# Patient Record
Sex: Female | Born: 1935 | Race: White | Hispanic: No | Marital: Married | Smoking: Never smoker
Health system: Southern US, Community
[De-identification: ages and names within clinical notes are randomized; demographics above are authoritative.]

## PROBLEM LIST (undated history)

## (undated) DIAGNOSIS — B029 Zoster without complications: Secondary | ICD-10-CM

## (undated) DIAGNOSIS — C50919 Malignant neoplasm of unspecified site of unspecified female breast: Secondary | ICD-10-CM

## (undated) DIAGNOSIS — I10 Essential (primary) hypertension: Secondary | ICD-10-CM

## (undated) DIAGNOSIS — J40 Bronchitis, not specified as acute or chronic: Secondary | ICD-10-CM

## (undated) DIAGNOSIS — I4891 Unspecified atrial fibrillation: Secondary | ICD-10-CM

## (undated) DIAGNOSIS — J45909 Unspecified asthma, uncomplicated: Secondary | ICD-10-CM

## (undated) DIAGNOSIS — C801 Malignant (primary) neoplasm, unspecified: Secondary | ICD-10-CM

## (undated) DIAGNOSIS — J449 Chronic obstructive pulmonary disease, unspecified: Secondary | ICD-10-CM

## (undated) HISTORY — DX: Essential (primary) hypertension: I10

## (undated) HISTORY — DX: Zoster without complications: B02.9

## (undated) HISTORY — DX: Malignant (primary) neoplasm, unspecified: C80.1

## (undated) HISTORY — DX: Bronchitis, not specified as acute or chronic: J40

## (undated) HISTORY — DX: Chronic obstructive pulmonary disease, unspecified: J44.9

## (undated) HISTORY — PX: NASAL SINUS SURGERY: SHX719

## (undated) HISTORY — DX: Unspecified asthma, uncomplicated: J45.909

---

## 2004-03-04 ENCOUNTER — Other Ambulatory Visit: Payer: Self-pay

## 2004-03-05 ENCOUNTER — Other Ambulatory Visit: Payer: Self-pay

## 2004-08-08 ENCOUNTER — Ambulatory Visit: Payer: Self-pay | Admitting: Unknown Physician Specialty

## 2004-11-26 ENCOUNTER — Ambulatory Visit: Payer: Self-pay | Admitting: Internal Medicine

## 2005-03-21 ENCOUNTER — Emergency Department: Payer: Self-pay | Admitting: Emergency Medicine

## 2005-09-28 ENCOUNTER — Ambulatory Visit: Payer: Self-pay | Admitting: Unknown Physician Specialty

## 2005-12-24 ENCOUNTER — Ambulatory Visit: Payer: Self-pay | Admitting: Otolaryngology

## 2005-12-31 ENCOUNTER — Ambulatory Visit: Payer: Self-pay | Admitting: Otolaryngology

## 2006-06-16 ENCOUNTER — Ambulatory Visit: Payer: Self-pay | Admitting: Unknown Physician Specialty

## 2006-07-15 ENCOUNTER — Ambulatory Visit: Payer: Self-pay | Admitting: Unknown Physician Specialty

## 2006-07-23 ENCOUNTER — Emergency Department: Payer: Self-pay | Admitting: Emergency Medicine

## 2006-11-23 ENCOUNTER — Other Ambulatory Visit: Payer: Self-pay

## 2006-11-23 ENCOUNTER — Emergency Department: Payer: Self-pay | Admitting: Emergency Medicine

## 2007-01-14 ENCOUNTER — Ambulatory Visit: Payer: Self-pay | Admitting: Unknown Physician Specialty

## 2007-01-21 ENCOUNTER — Inpatient Hospital Stay: Payer: Self-pay | Admitting: Unknown Physician Specialty

## 2007-07-19 ENCOUNTER — Ambulatory Visit: Payer: Self-pay | Admitting: Internal Medicine

## 2008-08-02 ENCOUNTER — Ambulatory Visit: Payer: Self-pay | Admitting: Internal Medicine

## 2008-08-11 ENCOUNTER — Ambulatory Visit: Payer: Self-pay | Admitting: Rheumatology

## 2008-10-22 ENCOUNTER — Ambulatory Visit: Payer: Self-pay | Admitting: Pain Medicine

## 2008-10-29 ENCOUNTER — Ambulatory Visit: Payer: Self-pay | Admitting: Pain Medicine

## 2008-11-20 ENCOUNTER — Ambulatory Visit: Payer: Self-pay | Admitting: Pain Medicine

## 2008-11-21 ENCOUNTER — Ambulatory Visit: Payer: Self-pay | Admitting: Pain Medicine

## 2008-12-17 ENCOUNTER — Ambulatory Visit: Payer: Self-pay | Admitting: Unknown Physician Specialty

## 2008-12-20 ENCOUNTER — Inpatient Hospital Stay: Payer: Self-pay | Admitting: Unknown Physician Specialty

## 2008-12-26 ENCOUNTER — Encounter: Payer: Self-pay | Admitting: Internal Medicine

## 2009-08-17 DIAGNOSIS — C801 Malignant (primary) neoplasm, unspecified: Secondary | ICD-10-CM

## 2009-08-17 DIAGNOSIS — C50919 Malignant neoplasm of unspecified site of unspecified female breast: Secondary | ICD-10-CM

## 2009-08-17 DIAGNOSIS — Z923 Personal history of irradiation: Secondary | ICD-10-CM

## 2009-08-17 HISTORY — DX: Personal history of irradiation: Z92.3

## 2009-08-17 HISTORY — DX: Malignant neoplasm of unspecified site of unspecified female breast: C50.919

## 2009-08-17 HISTORY — DX: Malignant (primary) neoplasm, unspecified: C80.1

## 2009-09-25 ENCOUNTER — Ambulatory Visit: Payer: Self-pay | Admitting: Internal Medicine

## 2009-09-27 ENCOUNTER — Ambulatory Visit: Payer: Self-pay | Admitting: Internal Medicine

## 2009-10-30 ENCOUNTER — Ambulatory Visit: Payer: Self-pay | Admitting: Surgery

## 2009-10-30 HISTORY — PX: BREAST EXCISIONAL BIOPSY: SUR124

## 2009-11-15 ENCOUNTER — Ambulatory Visit: Payer: Self-pay | Admitting: Surgery

## 2009-11-15 ENCOUNTER — Ambulatory Visit: Payer: Self-pay | Admitting: Internal Medicine

## 2009-11-26 ENCOUNTER — Ambulatory Visit: Payer: Self-pay | Admitting: Internal Medicine

## 2009-12-15 ENCOUNTER — Ambulatory Visit: Payer: Self-pay | Admitting: Internal Medicine

## 2010-01-15 ENCOUNTER — Ambulatory Visit: Payer: Self-pay | Admitting: Internal Medicine

## 2010-02-03 ENCOUNTER — Ambulatory Visit: Payer: Self-pay | Admitting: Unknown Physician Specialty

## 2010-02-14 ENCOUNTER — Ambulatory Visit: Payer: Self-pay | Admitting: Internal Medicine

## 2010-03-17 ENCOUNTER — Ambulatory Visit: Payer: Self-pay | Admitting: Internal Medicine

## 2010-04-17 ENCOUNTER — Ambulatory Visit: Payer: Self-pay | Admitting: Internal Medicine

## 2010-07-14 ENCOUNTER — Ambulatory Visit: Payer: Self-pay | Admitting: Internal Medicine

## 2010-07-17 ENCOUNTER — Ambulatory Visit: Payer: Self-pay | Admitting: Internal Medicine

## 2010-10-13 ENCOUNTER — Ambulatory Visit: Payer: Self-pay | Admitting: Internal Medicine

## 2010-10-16 ENCOUNTER — Ambulatory Visit: Payer: Self-pay | Admitting: Internal Medicine

## 2010-10-31 ENCOUNTER — Emergency Department: Payer: Self-pay | Admitting: Emergency Medicine

## 2010-11-06 ENCOUNTER — Ambulatory Visit: Payer: Self-pay | Admitting: Surgery

## 2011-01-15 ENCOUNTER — Ambulatory Visit: Payer: Self-pay | Admitting: Internal Medicine

## 2011-01-16 ENCOUNTER — Ambulatory Visit: Payer: Self-pay | Admitting: Internal Medicine

## 2011-02-15 ENCOUNTER — Ambulatory Visit: Payer: Self-pay | Admitting: Internal Medicine

## 2011-03-18 ENCOUNTER — Ambulatory Visit: Payer: Self-pay | Admitting: Internal Medicine

## 2011-03-24 ENCOUNTER — Ambulatory Visit: Payer: Self-pay | Admitting: Otolaryngology

## 2011-04-24 ENCOUNTER — Ambulatory Visit: Payer: Self-pay | Admitting: Internal Medicine

## 2011-05-13 ENCOUNTER — Ambulatory Visit: Payer: Self-pay | Admitting: Surgery

## 2011-05-18 ENCOUNTER — Ambulatory Visit: Payer: Self-pay | Admitting: Internal Medicine

## 2011-05-19 ENCOUNTER — Ambulatory Visit: Payer: Self-pay | Admitting: Surgery

## 2011-05-20 ENCOUNTER — Ambulatory Visit: Payer: Self-pay | Admitting: Internal Medicine

## 2011-06-18 ENCOUNTER — Ambulatory Visit: Payer: Self-pay | Admitting: Internal Medicine

## 2011-07-24 ENCOUNTER — Ambulatory Visit: Payer: Self-pay | Admitting: Internal Medicine

## 2011-08-17 ENCOUNTER — Ambulatory Visit: Payer: Self-pay | Admitting: Surgery

## 2011-08-18 ENCOUNTER — Ambulatory Visit: Payer: Self-pay | Admitting: Internal Medicine

## 2011-10-23 ENCOUNTER — Ambulatory Visit: Payer: Self-pay | Admitting: Internal Medicine

## 2011-10-23 LAB — CANCER CENTER HEMOGLOBIN: HGB: 12.1 g/dL (ref 12.0–16.0)

## 2011-10-23 LAB — FERRITIN: Ferritin (ARMC): 17 ng/mL (ref 8–388)

## 2011-11-11 ENCOUNTER — Ambulatory Visit: Payer: Self-pay | Admitting: Surgery

## 2011-11-16 ENCOUNTER — Ambulatory Visit: Payer: Self-pay | Admitting: Internal Medicine

## 2012-01-25 ENCOUNTER — Ambulatory Visit: Payer: Self-pay | Admitting: Internal Medicine

## 2012-01-25 LAB — FERRITIN: Ferritin (ARMC): 18 ng/mL (ref 8–388)

## 2012-01-25 LAB — IRON AND TIBC
Iron Bind.Cap.(Total): 381 ug/dL (ref 250–450)
Unbound Iron-Bind.Cap.: 281 ug/dL

## 2012-01-25 LAB — CANCER CENTER HEMOGLOBIN: HGB: 12 g/dL (ref 12.0–16.0)

## 2012-02-15 ENCOUNTER — Ambulatory Visit: Payer: Self-pay | Admitting: Internal Medicine

## 2012-04-25 ENCOUNTER — Ambulatory Visit: Payer: Self-pay | Admitting: Internal Medicine

## 2012-05-17 ENCOUNTER — Ambulatory Visit: Payer: Self-pay | Admitting: Internal Medicine

## 2012-05-18 LAB — OCCULT BLOOD X 1 CARD TO LAB, STOOL
Occult Blood, Feces: NEGATIVE
Occult Blood, Feces: NEGATIVE
Occult Blood, Feces: NEGATIVE

## 2012-06-17 ENCOUNTER — Ambulatory Visit: Payer: Self-pay | Admitting: Internal Medicine

## 2012-07-17 ENCOUNTER — Ambulatory Visit: Payer: Self-pay | Admitting: Internal Medicine

## 2012-07-17 ENCOUNTER — Ambulatory Visit: Payer: Self-pay | Admitting: Cardiothoracic Surgery

## 2012-07-25 LAB — CANCER CENTER HEMOGLOBIN: HGB: 13.4 g/dL (ref 12.0–16.0)

## 2012-08-16 LAB — OCCULT BLOOD X 1 CARD TO LAB, STOOL
Occult Blood, Feces: NEGATIVE
Occult Blood, Feces: NEGATIVE
Occult Blood, Feces: NEGATIVE

## 2012-08-17 ENCOUNTER — Ambulatory Visit: Payer: Self-pay | Admitting: Cardiothoracic Surgery

## 2012-08-17 ENCOUNTER — Ambulatory Visit: Payer: Self-pay | Admitting: Internal Medicine

## 2012-09-17 ENCOUNTER — Ambulatory Visit: Payer: Self-pay | Admitting: Internal Medicine

## 2012-11-15 ENCOUNTER — Ambulatory Visit: Payer: Self-pay | Admitting: Internal Medicine

## 2012-11-21 ENCOUNTER — Ambulatory Visit: Payer: Self-pay | Admitting: Internal Medicine

## 2012-11-21 ENCOUNTER — Ambulatory Visit: Payer: Self-pay | Admitting: Surgery

## 2012-12-15 ENCOUNTER — Ambulatory Visit: Payer: Self-pay | Admitting: Internal Medicine

## 2012-12-21 ENCOUNTER — Ambulatory Visit: Payer: Self-pay | Admitting: Specialist

## 2013-01-02 ENCOUNTER — Ambulatory Visit: Payer: Self-pay | Admitting: Internal Medicine

## 2013-01-15 ENCOUNTER — Ambulatory Visit: Payer: Self-pay | Admitting: Internal Medicine

## 2013-03-16 ENCOUNTER — Ambulatory Visit: Payer: Self-pay | Admitting: Ophthalmology

## 2013-03-16 LAB — CREATININE, SERUM
Creatinine: 1.17 mg/dL (ref 0.60–1.30)
EGFR (African American): 52 — ABNORMAL LOW
EGFR (Non-African Amer.): 45 — ABNORMAL LOW

## 2013-11-22 ENCOUNTER — Ambulatory Visit: Payer: Self-pay | Admitting: Surgery

## 2013-11-29 ENCOUNTER — Ambulatory Visit: Payer: Self-pay | Admitting: Surgery

## 2013-11-29 HISTORY — PX: BREAST EXCISIONAL BIOPSY: SUR124

## 2013-11-30 LAB — PATHOLOGY REPORT

## 2013-12-11 ENCOUNTER — Ambulatory Visit: Payer: Self-pay | Admitting: Unknown Physician Specialty

## 2013-12-12 LAB — PATHOLOGY REPORT

## 2014-01-05 ENCOUNTER — Ambulatory Visit: Payer: Self-pay | Admitting: Internal Medicine

## 2014-01-15 ENCOUNTER — Ambulatory Visit: Payer: Self-pay | Admitting: Internal Medicine

## 2014-03-08 ENCOUNTER — Ambulatory Visit: Payer: Self-pay | Admitting: Internal Medicine

## 2014-03-17 ENCOUNTER — Ambulatory Visit: Payer: Self-pay | Admitting: Internal Medicine

## 2014-04-10 ENCOUNTER — Ambulatory Visit: Payer: Self-pay | Admitting: Ophthalmology

## 2014-04-10 LAB — HEMOGLOBIN: HGB: 11.6 g/dL — AB (ref 12.0–16.0)

## 2014-04-12 ENCOUNTER — Ambulatory Visit: Payer: Self-pay | Admitting: Pain Medicine

## 2014-04-16 ENCOUNTER — Ambulatory Visit: Payer: Self-pay | Admitting: Ophthalmology

## 2014-04-30 ENCOUNTER — Ambulatory Visit: Payer: Self-pay | Admitting: Pain Medicine

## 2014-05-07 ENCOUNTER — Ambulatory Visit: Payer: Self-pay | Admitting: Ophthalmology

## 2014-05-07 LAB — HEMOGLOBIN: HGB: 12 g/dL (ref 12.0–16.0)

## 2014-05-14 ENCOUNTER — Ambulatory Visit: Payer: Self-pay | Admitting: Ophthalmology

## 2014-05-20 ENCOUNTER — Ambulatory Visit: Payer: Self-pay | Admitting: Ophthalmology

## 2014-05-22 ENCOUNTER — Ambulatory Visit: Payer: Self-pay | Admitting: Pain Medicine

## 2014-05-24 ENCOUNTER — Ambulatory Visit: Payer: Self-pay | Admitting: Surgery

## 2014-05-24 LAB — EYE CULTURE

## 2014-05-28 ENCOUNTER — Ambulatory Visit: Payer: Self-pay | Admitting: Pain Medicine

## 2014-06-19 ENCOUNTER — Ambulatory Visit: Payer: Self-pay | Admitting: Pain Medicine

## 2014-07-02 ENCOUNTER — Ambulatory Visit: Payer: Self-pay | Admitting: Pain Medicine

## 2014-07-17 ENCOUNTER — Ambulatory Visit: Payer: Self-pay | Admitting: Pain Medicine

## 2014-07-25 ENCOUNTER — Ambulatory Visit: Payer: Self-pay | Admitting: Pain Medicine

## 2014-08-14 ENCOUNTER — Ambulatory Visit: Payer: Self-pay | Admitting: Pain Medicine

## 2014-09-13 ENCOUNTER — Ambulatory Visit: Payer: Self-pay | Admitting: Pain Medicine

## 2014-10-05 ENCOUNTER — Ambulatory Visit: Payer: Self-pay | Admitting: Internal Medicine

## 2014-10-16 ENCOUNTER — Ambulatory Visit
Admit: 2014-10-16 | Disposition: A | Discharge status: Home / Self Care | Payer: Self-pay | Attending: Internal Medicine | Admitting: Internal Medicine

## 2014-10-16 ENCOUNTER — Ambulatory Visit: Payer: Self-pay | Admitting: Pain Medicine

## 2014-10-29 ENCOUNTER — Ambulatory Visit: Payer: Self-pay | Admitting: Pain Medicine

## 2014-11-15 ENCOUNTER — Ambulatory Visit
Admit: 2014-11-15 | Disposition: A | Discharge status: Home / Self Care | Payer: Self-pay | Attending: Pain Medicine | Admitting: Pain Medicine

## 2014-11-26 ENCOUNTER — Emergency Department
Admit: 2014-11-26 | Disposition: A | Discharge status: Home / Self Care | Payer: Self-pay | Admitting: Emergency Medicine

## 2014-11-26 LAB — CBC WITH DIFFERENTIAL/PLATELET
BASOS ABS: 0.1 10*3/uL (ref 0.0–0.1)
BASOS PCT: 1.5 %
Eosinophil #: 0.6 10*3/uL (ref 0.0–0.7)
Eosinophil %: 11 %
HCT: 35.9 % (ref 35.0–47.0)
HGB: 11.6 g/dL — AB (ref 12.0–16.0)
LYMPHS PCT: 25.9 %
Lymphocyte #: 1.5 10*3/uL (ref 1.0–3.6)
MCH: 27.2 pg (ref 26.0–34.0)
MCHC: 32.4 g/dL (ref 32.0–36.0)
MCV: 84 fL (ref 80–100)
MONOS PCT: 12.1 %
Monocyte #: 0.7 x10 3/mm (ref 0.2–0.9)
Neutrophil #: 2.8 10*3/uL (ref 1.4–6.5)
Neutrophil %: 49.5 %
Platelet: 186 10*3/uL (ref 150–440)
RBC: 4.27 10*6/uL (ref 3.80–5.20)
RDW: 14.6 % — ABNORMAL HIGH (ref 11.5–14.5)
WBC: 5.7 10*3/uL (ref 3.6–11.0)

## 2014-11-26 LAB — BASIC METABOLIC PANEL
ANION GAP: 10 (ref 7–16)
BUN: 15 mg/dL
Calcium, Total: 9.8 mg/dL
Chloride: 102 mmol/L
Co2: 28 mmol/L
Creatinine: 1.07 mg/dL — ABNORMAL HIGH
EGFR (African American): 57 — ABNORMAL LOW
EGFR (Non-African Amer.): 49 — ABNORMAL LOW
GLUCOSE: 118 mg/dL — AB
Potassium: 3.5 mmol/L
SODIUM: 140 mmol/L

## 2014-11-30 ENCOUNTER — Other Ambulatory Visit: Payer: Self-pay | Admitting: Internal Medicine

## 2014-11-30 DIAGNOSIS — C50919 Malignant neoplasm of unspecified site of unspecified female breast: Secondary | ICD-10-CM

## 2014-11-30 DIAGNOSIS — Z79891 Long term (current) use of opiate analgesic: Secondary | ICD-10-CM

## 2014-12-03 ENCOUNTER — Ambulatory Visit
Admit: 2014-12-03 | Disposition: A | Discharge status: Home / Self Care | Payer: Self-pay | Attending: Internal Medicine | Admitting: Internal Medicine

## 2014-12-08 NOTE — Op Note (Signed)
PATIENT NAME:  Janice Perkins, STILLE MR#:  [id number] DATE OF BIRTH:  1932/11/06  DATE OF PROCEDURE:  05/14/2014  PREOPERATIVE DIAGNOSIS: Cataract, left eye.   POSTOPERATIVE DIAGNOSIS: Cataract, left eye.   PROCEDURE PERFORMED: Extracapsular cataract extraction using phacoemulsification with placement of Alcon SN6CWS, 21.5 diopter posterior chamber lens, serial number [id number].   ANESTHESIA: 4% lidocaine and 0.75% Marcaine, a 50-50 mixture with 10 units/mL of Hylenex added given as a peribulbar.   ANESTHESIOLOGIST: Dr. Marcello Moores.   COMPLICATIONS: None.   ESTIMATED BLOOD LOSS: Less than 1 mL.   DESCRIPTION OF PROCEDURE:  The patient was brought to the operating room and given a peribulbar block.  The patient was then prepped and draped in the usual fashion.  The vertical rectus muscles were imbricated using 5-0 silk sutures.  These sutures were then clamped to the sterile drapes as bridle sutures.  A limbal peritomy was performed extending two clock hours and hemostasis was obtained with cautery.  A partial thickness scleral groove was made at the surgical limbus and dissected anteriorly in a lamellar dissection using an Alcon crescent knife.  The anterior chamber was entered supero-temporally with a Superblade and through the lamellar dissection with a 2.6 mm keratome.  DisCoVisc was used to replace the aqueous and a continuous tear capsulorrhexis was carried out.  Hydrodissection and hydrodelineation were carried out with balanced salt and a 27 gauge canula.  The nucleus was rotated to confirm the effectiveness of the hydrodissection.  Phacoemulsification was carried out using a divide-and-conquer technique.  Total ultrasound time was 1 minute and 4.6 seconds with an average power of 24.8 percent. CDE of 26.93.    Irrigation/aspiration was used to remove the residual cortex.  DisCoVisc was used to inflate the capsule and the internal incision was enlarged to 3 mm with the crescent knife.  The  intraocular lens was folded and inserted into the capsular bag using an AcrySert delivery system.  Irrigation/aspiration was used to remove the residual DisCoVisc.  Miostat was injected into the anterior chamber through the paracentesis track to inflate the anterior chamber and induce miosis.  0.1 mL of Vigamox containing 0.1 mg of drug was injected via the paracentesis track.  The wound was checked for leaks and none were found. The conjunctiva was closed with cautery and the bridle sutures were removed.  Two drops of 0.3% Vigamox were placed on the eye.   An eye shield was placed on the eye.  The patient was discharged to the recovery room in good condition.   ____________________________ Loura Back Velna Hedgecock, MD sad:bu D: 05/14/2014 13:09:01 ET T: 05/14/2014 13:45:36 ET JOB#: [id number]  cc: Remo Lipps A. Gwynneth Fabio, MD, <Dictator> Martie Lee MD ELECTRONICALLY SIGNED 05/21/2014 12:11

## 2014-12-08 NOTE — Op Note (Signed)
PATIENT NAME:  Janice Perkins, Janice Perkins MR#:  [id number] DATE OF BIRTH:  Aug 21, 1932  DATE OF PROCEDURE:  04/16/2014  PREOPERATIVE DIAGNOSIS: Nuclear sclerotic cataract, right eye.   POSTOPERATIVE DIAGNOSIS: Nuclear sclerotic cataract, left eye.   PROCEDURE PERFORMED: Extracapsular cataract extraction using phacoemulsification with placement of Alcon SN6CWS, 22.0 diopter posterior chamber lens, serial number [id number].   SURGEON: Loura Back. Tramaine Snell, MD   ANESTHESIA: Lidocaine 4% and 0.75% Marcaine, a 50-50 mixture with 10 units/mL of Hylenex added given as a peribulbar.   ANESTHESIOLOGIST: Velora Heckler. Polin, MD  COMPLICATIONS: None.   ESTIMATED BLOOD LOSS: Less than 1 mL.   DESCRIPTION OF PROCEDURE: The patient was brought to the operating room and given a peribulbar block.  The patient was then prepped and draped in the usual fashion.  The vertical rectus muscles were imbricated using 5-0 silk sutures.  These sutures were then clamped to the sterile drapes as bridle sutures.  A limbal peritomy was performed extending two clock hours and hemostasis was obtained with cautery.  A partial thickness scleral groove was made at the surgical limbus and dissected anteriorly in a lamellar dissection using an Alcon crescent knife.  The anterior chamber was entered superonasally with a Superblade and through the lamellar dissection with a 2.6 mm keratome.  DisCoVisc was used to replace the aqueous and a continuous tear capsulorrhexis was carried out.  Hydrodissection and hydrodelineation were carried out with balanced salt and a 27 gauge canula.  The nucleus was rotated to confirm the effectiveness of the hydrodissection.  Phacoemulsification was carried out using a divide-and-conquer technique.  Total ultrasound time was 1 minutes and 1 second with an average power of 22 percent, CDE of 23.80.  Irrigation/aspiration was used to remove the residual cortex.  DisCoVisc was used to inflate the capsule and the  internal incision was enlarged to 3 mm with the crescent knife.  The intraocular lens was folded and inserted into the capsular bag using the AcrySert delivery system.  Irrigation/aspiration was used to remove the residual DisCoVisc.  Miostat was injected into the anterior chamber through the paracentesis track to inflate the anterior chamber and induce miosis.  A tenth of a millimeter of Vigamox containing 0.1 mg of drug was injected via the paracentesis tract. The wound was checked for leaks and none were found. The conjunctiva was closed with cautery and the bridle sutures were removed.  Two drops of 0.3% Vigamox were placed on the eye.   An eye shield was placed on the eye.  The patient was discharged to the recovery room in good condition.   ____________________________ Loura Back Kenitha Glendinning, MD sad:TT D: 04/16/2014 13:25:13 ET T: 04/16/2014 22:40:49 ET JOB#: [id number]  cc: Remo Lipps A. Hira Trent, MD, <Dictator> Martie Lee MD ELECTRONICALLY SIGNED 04/24/2014 11:25

## 2014-12-13 ENCOUNTER — Ambulatory Visit
Admit: 2014-12-13 | Disposition: A | Discharge status: Home / Self Care | Payer: Self-pay | Attending: Pain Medicine | Admitting: Pain Medicine

## 2014-12-28 ENCOUNTER — Telehealth: Payer: Self-pay | Admitting: *Deleted

## 2014-12-28 MED ORDER — LETROZOLE 2.5 MG PO TABS: 2.5000 mg | ORAL_TABLET | Freq: Every day | ORAL | Status: DC

## 2014-12-28 NOTE — Telephone Encounter (Signed)
Rx sent to [facility]. 

## 2015-01-14 ENCOUNTER — Other Ambulatory Visit: Payer: Self-pay | Admitting: Pain Medicine

## 2015-01-14 DIAGNOSIS — M5481 Occipital neuralgia: Secondary | ICD-10-CM

## 2015-01-14 DIAGNOSIS — M51369 Other intervertebral disc degeneration, lumbar region without mention of lumbar back pain or lower extremity pain: Secondary | ICD-10-CM | POA: Insufficient documentation

## 2015-01-14 DIAGNOSIS — M533 Sacrococcygeal disorders, not elsewhere classified: Secondary | ICD-10-CM

## 2015-01-14 DIAGNOSIS — M7060 Trochanteric bursitis, unspecified hip: Secondary | ICD-10-CM | POA: Insufficient documentation

## 2015-01-14 DIAGNOSIS — M47817 Spondylosis without myelopathy or radiculopathy, lumbosacral region: Secondary | ICD-10-CM | POA: Insufficient documentation

## 2015-01-14 DIAGNOSIS — M5136 Other intervertebral disc degeneration, lumbar region: Secondary | ICD-10-CM | POA: Insufficient documentation

## 2015-01-15 ENCOUNTER — Encounter: Payer: Self-pay | Admitting: Pain Medicine

## 2015-01-15 ENCOUNTER — Ambulatory Visit: Payer: Medicare Other | Admitting: Pain Medicine

## 2015-01-15 ENCOUNTER — Ambulatory Visit
Admission: RE | Admit: 2015-01-15 | Discharge: 2015-01-15 | Disposition: A | Discharge status: Home / Self Care | Payer: Medicare Other | Source: Ambulatory Visit | Attending: Internal Medicine | Admitting: Internal Medicine

## 2015-01-15 VITALS — BP 147/65 | HR 74 | Temp 98.3°F | Resp 15 | Ht 61.0 in | Wt 180.0 lb

## 2015-01-15 DIAGNOSIS — M5481 Occipital neuralgia: Secondary | ICD-10-CM

## 2015-01-15 DIAGNOSIS — M5136 Other intervertebral disc degeneration, lumbar region: Secondary | ICD-10-CM | POA: Diagnosis not present

## 2015-01-15 DIAGNOSIS — Z79891 Long term (current) use of opiate analgesic: Secondary | ICD-10-CM | POA: Insufficient documentation

## 2015-01-15 DIAGNOSIS — M5126 Other intervertebral disc displacement, lumbar region: Secondary | ICD-10-CM | POA: Diagnosis not present

## 2015-01-15 DIAGNOSIS — M79606 Pain in leg, unspecified: Secondary | ICD-10-CM | POA: Diagnosis not present

## 2015-01-15 DIAGNOSIS — N959 Unspecified menopausal and perimenopausal disorder: Secondary | ICD-10-CM | POA: Diagnosis not present

## 2015-01-15 DIAGNOSIS — M8580 Other specified disorders of bone density and structure, unspecified site: Secondary | ICD-10-CM | POA: Diagnosis not present

## 2015-01-15 DIAGNOSIS — Z853 Personal history of malignant neoplasm of breast: Secondary | ICD-10-CM | POA: Diagnosis not present

## 2015-01-15 DIAGNOSIS — M461 Sacroiliitis, not elsewhere classified: Secondary | ICD-10-CM | POA: Insufficient documentation

## 2015-01-15 DIAGNOSIS — M533 Sacrococcygeal disorders, not elsewhere classified: Secondary | ICD-10-CM

## 2015-01-15 DIAGNOSIS — M47817 Spondylosis without myelopathy or radiculopathy, lumbosacral region: Secondary | ICD-10-CM

## 2015-01-15 DIAGNOSIS — M7060 Trochanteric bursitis, unspecified hip: Secondary | ICD-10-CM

## 2015-01-15 MED ORDER — HYDROCODONE-ACETAMINOPHEN 5-325 MG PO TABS: ORAL_TABLET | ORAL | Status: DC

## 2015-01-15 NOTE — Progress Notes (Signed)
   Subjective:    Patient ID: Janice Perkins, female    DOB: [birth year], 81 y.o.   MRN: [id number]  HPI   patient is a 81 year old female returns to pain management for further evaluation and treatment of pain involving the lower back and lower extremity regions predominantly. Patient without complaint of headache or significant lower back lower extremity pain at this time. Patient denies any trauma change in events of daily living the call significant change in symptomatology. Continue present medications as prescribed will remain available to consider modification of treatment should there be significant change in patient's symptoms.  The patient is understanding and  agrees with suggested treatment plan area     Review of Systems     Objective:   Physical Exam   tenderness of the splenius capitis and occipitalis regions of mild degree. Tennis of the acromioclavicular glenohumeral joint region. Bilaterally equal grip strength. Tinel and Phalen's maneuver without increased pain of significant degree. Unremarkable Spurling's maneuver. Outpatient of the thoracic facet thoracic paraspinal musculature region associated with minimal discomfort. No crepitus of the thoracic region noted. Palpation over the lumbar paraspinal musculature musculature region lumbar facet region was associated with mild to moderate discomfort with lateral bending and rotation reproducing mild to moderate discomfort. Extension and palpation of the lumbar facets reproduce moderate discomfort.  Moderate tenderness of the PSIS and PII S regions notedStraight leg raising was tolerates approximately 30 without increase of pain with dorsiflexion noted. Sensory deficit of dermatomal distribution detected. Abdomen nontender and no costovertebral angle tenderness noted.     Assessment & Plan:   degenerative disc disease lumbar spine  L3-4 and L4-5 broad-based disc bulge and facet arthropathy present at L2-3, L3-4, L4-5 and L5-S1.  Lumbar facet syndrome   Sacroiliitis sacroiliac joint dysfunction   Greater trochanteric bursitis   Bilateral occipital neuralgia    plan   Continue present medications.  F/U PCP for evaliation of  BP and general medical  condition.  F/U surgical evaluation.  F/U neurological evaluation.  May consider radiofrequency rhizolysis or intraspinal procedures pending response to present treatment and F/U evaluation.  Patient to call Pain Management Center should patient have concerns prior to scheduled return appointment.

## 2015-01-15 NOTE — Progress Notes (Signed)
Safety precautions to be maintained throughout the outpatient stay will include: orient to surroundings, keep bed in low position, maintain call bell within reach at all times, provide assistance with transfer out of bed and ambulation.  

## 2015-01-15 NOTE — Progress Notes (Signed)
Discharged to home with script for Norco on hand.  Instructed to follow up for blood pressure.  Ambulatory.

## 2015-01-15 NOTE — Patient Instructions (Addendum)
Continue present medications.  F/U PCP for evaliation of  BP and general medical  condition.. See Dr. Ginette Pitman  this week as discussed  for evaluation of your blood pressure which is low  Vascular eval as planned   F/U surgical evaluation.  F/U neurological evaluation.  May consider radiofrequency rhizolysis or intraspinal procedures pending response to present treatment and F/U evaluation.  Patient to call Pain Management Center should patient have concerns prior to scheduled return appointment.

## 2015-01-23 ENCOUNTER — Other Ambulatory Visit: Payer: Self-pay | Admitting: Pain Medicine

## 2015-02-14 ENCOUNTER — Ambulatory Visit: Payer: Medicare Other | Attending: Pain Medicine | Admitting: Pain Medicine

## 2015-02-14 ENCOUNTER — Encounter: Payer: Self-pay | Admitting: Pain Medicine

## 2015-02-14 VITALS — BP 135/78 | HR 60 | Temp 98.4°F | Resp 16 | Ht 61.0 in | Wt 180.0 lb

## 2015-02-14 DIAGNOSIS — M79605 Pain in left leg: Secondary | ICD-10-CM | POA: Diagnosis present

## 2015-02-14 DIAGNOSIS — M5136 Other intervertebral disc degeneration, lumbar region: Secondary | ICD-10-CM | POA: Insufficient documentation

## 2015-02-14 DIAGNOSIS — M7060 Trochanteric bursitis, unspecified hip: Secondary | ICD-10-CM

## 2015-02-14 DIAGNOSIS — M533 Sacrococcygeal disorders, not elsewhere classified: Secondary | ICD-10-CM | POA: Diagnosis not present

## 2015-02-14 DIAGNOSIS — G588 Other specified mononeuropathies: Secondary | ICD-10-CM | POA: Insufficient documentation

## 2015-02-14 DIAGNOSIS — M5481 Occipital neuralgia: Secondary | ICD-10-CM

## 2015-02-14 DIAGNOSIS — M545 Low back pain: Secondary | ICD-10-CM | POA: Diagnosis present

## 2015-02-14 DIAGNOSIS — M5126 Other intervertebral disc displacement, lumbar region: Secondary | ICD-10-CM | POA: Insufficient documentation

## 2015-02-14 DIAGNOSIS — M79604 Pain in right leg: Secondary | ICD-10-CM | POA: Diagnosis present

## 2015-02-14 DIAGNOSIS — M47817 Spondylosis without myelopathy or radiculopathy, lumbosacral region: Secondary | ICD-10-CM

## 2015-02-14 MED ORDER — HYDROCODONE-ACETAMINOPHEN 5-325 MG PO TABS: ORAL_TABLET | ORAL | Status: DC

## 2015-02-14 NOTE — Progress Notes (Signed)
Safety precautions to be maintained throughout the outpatient stay will include: orient to surroundings, keep bed in low position, maintain call bell within reach at all times, provide assistance with transfer out of bed and ambulation.  Discharge patient home ambulatory at 0835 hrs; Teach back done Script given for Hydrocodone

## 2015-02-14 NOTE — Patient Instructions (Signed)
Continue present medications hydrocodone acetaminophen F/U PCP for evaliation of  BP and general medical  condition.  F/U surgical evaluation  F/U neurological evaluation  May consider radiofrequency rhizolysis or intraspinal procedures pending response to present treatment and F/U evaluation.  Patient to call Pain Management Center should patient have concerns prior to scheduled return appointment.  

## 2015-02-14 NOTE — Progress Notes (Signed)
   Subjective:    Patient ID: Janice Perkins, female    DOB: 1932/10/31, 81 y.o.   MRN: [id number]  HPI  Patient is a 81 year old female returns to [place] for further evaluation and treatment of pain involving the lower back and lower extremity region predominantly The  patient denies trauma or change in events of daily living the call significant change in symptomatology. . Patient states the present time her pain appears to be fairly well controlled without significant lower back lower extremity pain. We will continue present treatment regimen and will consider modification of treatment pending follow-up evaluation. The patient was understanding and agreement status treatment plan    Review of Systems     Objective:   Physical Exam  There was mild tends to palpation of paraspinal musculature region cervical region cervical facet region palpation which reproduces minimal discomfort. There was minimal tenderness of the trapezius levator scapula and rhomboid musculature regions. Patient was with bilaterally equal grip strength. Tinel and Phalen's maneuver were without increase of pain of significant degree. There was unremarkable Spurling's maneuver. Palpation over the thoracic facet thoracic paraspinal musculature region was a tends to palpation of moderate degree no crepitus of the thoracic region noted. Palpation over the lumbar paraspinal musculature region lumbar facet region associated with mild to moderate discomfort. Lateral bending and rotation and extension and palpation of the lumbar facets reproduce moderate discomfort straight leg raising was tolerates approximately 30 without an increase of pain with dorsiflexion noted. No definite sensory deficit of dermatomal distribution was detected. There was negative clonus negative Homans. Minimal tenderness of the greater trochanteric region iliotibial band region abdomen was nontender and no costovertebral angle tenderness was  noted      Assessment & Plan:  Degenerative disc disease lumbar spine Moderate involvement L3-4 and L4-L5 with broad-based disc bulge and facet arthropathy present at L2-3, L3-4, L4-5, and L5-S1  Lumbar facet syndrome   Gluteal and piriformis neuralgia  Sacroiliac joint dysfunction  Bilateral occipital neuralgia    Plan  Continue present medicationsHydrocodone acetaminophen  F/U PCP for evaliation of  BP and general medical  condition.  F/U surgical evaluation  F/U neurological evaluation  May consider radiofrequency rhizolysis or intraspinal procedures pending response to present treatment and F/U evaluation.  Patient to call Pain Management Center should patient have concerns prior to scheduled return appointment.

## 2015-03-14 ENCOUNTER — Encounter: Payer: Self-pay | Admitting: Pain Medicine

## 2015-03-14 ENCOUNTER — Ambulatory Visit: Payer: Medicare Other | Attending: Pain Medicine | Admitting: Pain Medicine

## 2015-03-14 VITALS — BP 165/77 | HR 64 | Temp 97.9°F | Resp 16 | Ht 60.0 in | Wt 180.0 lb

## 2015-03-14 DIAGNOSIS — M5481 Occipital neuralgia: Secondary | ICD-10-CM | POA: Diagnosis not present

## 2015-03-14 DIAGNOSIS — M79605 Pain in left leg: Secondary | ICD-10-CM | POA: Diagnosis present

## 2015-03-14 DIAGNOSIS — M79604 Pain in right leg: Secondary | ICD-10-CM | POA: Diagnosis present

## 2015-03-14 DIAGNOSIS — M1288 Other specific arthropathies, not elsewhere classified, other specified site: Secondary | ICD-10-CM | POA: Insufficient documentation

## 2015-03-14 DIAGNOSIS — M7060 Trochanteric bursitis, unspecified hip: Secondary | ICD-10-CM

## 2015-03-14 DIAGNOSIS — M533 Sacrococcygeal disorders, not elsewhere classified: Secondary | ICD-10-CM

## 2015-03-14 DIAGNOSIS — G588 Other specified mononeuropathies: Secondary | ICD-10-CM | POA: Diagnosis not present

## 2015-03-14 DIAGNOSIS — M5126 Other intervertebral disc displacement, lumbar region: Secondary | ICD-10-CM | POA: Diagnosis not present

## 2015-03-14 DIAGNOSIS — M545 Low back pain: Secondary | ICD-10-CM | POA: Diagnosis present

## 2015-03-14 DIAGNOSIS — M5136 Other intervertebral disc degeneration, lumbar region: Secondary | ICD-10-CM | POA: Diagnosis not present

## 2015-03-14 DIAGNOSIS — M47817 Spondylosis without myelopathy or radiculopathy, lumbosacral region: Secondary | ICD-10-CM

## 2015-03-14 MED ORDER — HYDROCODONE-ACETAMINOPHEN 5-325 MG PO TABS: ORAL_TABLET | ORAL | Status: DC

## 2015-03-14 NOTE — Patient Instructions (Signed)
Continue present medication hydrocodone acetaminophen  F/U PCP Dr. Ginette Pitman  for evaliation of  BP and general medical  condition.  F/U surgical evaluation  F/U neurological evaluation  May consider radiofrequency rhizolysis or intraspinal procedures pending response to present treatment and F/U evaluation.  Patient to call Pain Management Center should patient have concerns prior to scheduled return appointment.

## 2015-03-14 NOTE — Progress Notes (Signed)
Safety precautions to be maintained throughout the outpatient stay will include: orient to surroundings, keep bed in low position, maintain call bell within reach at all times, provide assistance with transfer out of bed and ambulation.  

## 2015-03-14 NOTE — Progress Notes (Signed)
   Subjective:    Patient ID: Janice Perkins, female    DOB: 12/26/1932, 81 y.o.   MRN: [id number]  Back Pain    Patient is a 81 year old female returns to [place] for further evaluation and treatment of pain involving the lower back and lower extremity region predominantly The  patient denies trauma or change in events of daily living the call significant change in symptomatology. . Patient states the present time her pain appears to be fairly well controlled without significant lower back lower extremity pain. We will continue present treatment regimen and will consider modification of treatment pending follow-up evaluation. The patient was understanding and agreement status treatment plan    Review of Systems  Musculoskeletal: Positive for back pain.       Objective:   Physical Exam   There was mild tends to palpation of paraspinal musculature region cervical region cervical facet region palpation which reproduces minimal discomfort. There was minimal tenderness of the trapezius levator scapula and rhomboid musculature regions. Patient was with bilaterally equal grip strength. Tinel and Phalen's maneuver were without increase of pain of significant degree. There was unremarkable Spurling's maneuver. Palpation over the thoracic facet thoracic paraspinal musculature region was a tends to palpation of moderate degree no crepitus of the thoracic region noted. Palpation over the lumbar paraspinal musculature region lumbar facet region associated with mild to moderate discomfort. Lateral bending and rotation and extension and palpation of the lumbar facets reproduce moderate discomfort straight leg raising was tolerates approximately 30 without an increase of pain with dorsiflexion noted. No definite sensory deficit of dermatomal distribution was detected. There was negative clonus negative Homans. Minimal tenderness of the greater trochanteric region iliotibial band region abdomen  was nontender and no costovertebral angle tenderness was noted      Assessment & Plan:  Degenerative disc disease lumbar spine Moderate involvement L3-4 and L4-L5 with broad-based disc bulge and facet arthropathy present at L2-3, L3-4, L4-5, and L5-S1  Lumbar facet syndrome   Gluteal and piriformis neuralgia  Sacroiliac joint dysfunction  Bilateral occipital neuralgia    Plan  Continue present medicationsHydrocodone acetaminophen  F/U PCP Dr.Hande  for evaliation of  BP and general medical  condition.  F/U surgical evaluation  F/U neurological evaluation  May consider radiofrequency rhizolysis or intraspinal procedures pending response to present treatment and F/U evaluation.  Patient to call Pain Management Center should patient have concerns prior to scheduled return appointment.

## 2015-03-14 NOTE — Progress Notes (Signed)
Dr Primus Bravo noitified of UDS report in chart

## 2015-04-01 ENCOUNTER — Telehealth: Payer: Self-pay | Admitting: Pain Medicine

## 2015-04-01 NOTE — Telephone Encounter (Signed)
Pain over the left hip. Increased pain with weight-bearing. Pain also down both arms, knees, shoulder blades. Thinks this may be muscle spasms. Will wait a few days to see if it gets better, if not, will call back.

## 2015-04-01 NOTE — Telephone Encounter (Signed)
Patient is having increased pain due to muscle spasms / please call

## 2015-04-02 NOTE — Telephone Encounter (Signed)
States symptoms have  improved a little this morning but continues to have some shooting pains and spasms. States tripped but did not fall on Sunday. States pain is at top of low back, when she weight bears on her foot it makes her hip start hurting. Please advise on what to tell patient.

## 2015-04-02 NOTE — Telephone Encounter (Signed)
Dena and other Nurses, Tell patient to please call back if her symptoms continue and if she does not improve. I will see patient for evaluation at that time Patient may also call her primary care physician to inform her primary care physician of her symptoms as well

## 2015-04-02 NOTE — Telephone Encounter (Signed)
Ms. Mcniel called at 9:10 to say she still has no relief / please call patient back

## 2015-04-02 NOTE — Telephone Encounter (Signed)
Juliann Pulse and Nurses, Please call patient this morning Juliann Pulse notified me again stating that the patient has no relief Please describe patient's pain and symptoms to me this morning Schedule patient for evaluation and procedure for Wednesday, 04/03/2015 Please describe the pain and symptoms to me this morning so that I will know how to prepare plan of treatment for the patient tomorrow

## 2015-04-03 ENCOUNTER — Ambulatory Visit: Payer: Medicare Other | Attending: Pain Medicine | Admitting: Pain Medicine

## 2015-04-03 VITALS — BP 108/56 | HR 68 | Temp 98.0°F | Resp 18 | Ht 61.0 in | Wt 180.0 lb

## 2015-04-03 DIAGNOSIS — M5481 Occipital neuralgia: Secondary | ICD-10-CM

## 2015-04-03 DIAGNOSIS — M5126 Other intervertebral disc displacement, lumbar region: Secondary | ICD-10-CM | POA: Diagnosis not present

## 2015-04-03 DIAGNOSIS — M79605 Pain in left leg: Secondary | ICD-10-CM | POA: Diagnosis present

## 2015-04-03 DIAGNOSIS — M47817 Spondylosis without myelopathy or radiculopathy, lumbosacral region: Secondary | ICD-10-CM

## 2015-04-03 DIAGNOSIS — M5136 Other intervertebral disc degeneration, lumbar region: Secondary | ICD-10-CM | POA: Diagnosis not present

## 2015-04-03 DIAGNOSIS — M533 Sacrococcygeal disorders, not elsewhere classified: Secondary | ICD-10-CM

## 2015-04-03 DIAGNOSIS — M79604 Pain in right leg: Secondary | ICD-10-CM | POA: Diagnosis present

## 2015-04-03 DIAGNOSIS — M545 Low back pain: Secondary | ICD-10-CM | POA: Diagnosis present

## 2015-04-03 DIAGNOSIS — M1288 Other specific arthropathies, not elsewhere classified, other specified site: Secondary | ICD-10-CM | POA: Insufficient documentation

## 2015-04-03 DIAGNOSIS — M7060 Trochanteric bursitis, unspecified hip: Secondary | ICD-10-CM

## 2015-04-03 MED ORDER — TRIAMCINOLONE ACETONIDE 40 MG/ML IJ SUSP
INTRAMUSCULAR | Status: AC
Start: 2015-04-03 — End: 2015-04-03
  Administered 2015-04-03: 40 mg
  Filled 2015-04-03: qty 1

## 2015-04-03 MED ORDER — BUPIVACAINE HCL (PF) 0.25 % IJ SOLN
INTRAMUSCULAR | Status: AC
Start: 2015-04-03 — End: 2015-04-03
  Administered 2015-04-03: 13:00:00
  Filled 2015-04-03: qty 30

## 2015-04-03 NOTE — Progress Notes (Signed)
   Subjective:    Patient ID: Janice Perkins, female    DOB: Oct 28, 1932, 81 y.o.   MRN: [id number]  HPI  PROCEDURE PERFORMED: Cluneal sciatic nerve block   NOTE: The patient is a 81 y.o. female who returns to [place] for further evaluation and treatment of pain involving the lumbar and lower extremity region with pain occurring in the region of the buttocks and lower extremity of significant degree. Prior studies revealed the patient to be with  MRI findings of degenerative disc disease lumbar spine  with moderate involvement L3-4 and L4-L5 with broad-based disc bulge and facet arthropathy present at L2-3, L3-4, L4-5, and L5-S1. The risks, benefits, and expectations of the procedure have been discussed and explained to the patient who was understanding and in agreement with suggested treatment plan. We will proceed with interventional treatment as discussed and as explained to the patient who wishes to proceed with proposed treatment.   PROCEDURE #1:  left cluneal nerve block with EKG, blood pressure, pulse, and pulse oximetry monitoring. The procedure was performed with the patient in the lateral decubitus position. Following alcohol prep of proposed entry site and identification of landmarks, a 22 -gauge needle was inserted into the gluteal musculature region and following elicitation of paresthesias radiating to the buttocks, needle was slightly withdrawn and following negative aspiration, a total of 4 mL of 0.25% bupivacaine with Kenalog injected for  left  cluneal nerve block. Needle removed. The patient tolerated the injection well.   PROCEDURE #2:  left sciatic nerve block with EKG, blood pressure, pulse, and pulse oximetry monitoring. The procedure was performed with the patient in the lateral decubitus position. Following identification of greater trochanter for establishing point of needle entry and alcohol prep of proposed entry site, a 22 -gauge needle was inserted and following  elicitation of paresthesias radiating from buttocks to the foot, needle was slightly withdrawn. Following negative aspiration, a total of 4 mL of 0.25% bupivacaine with Kenalog injected for  left sciatic nerve block. Needle removed.   PROCEDURE:  Right Cluneal and Sciatic Blocks The procedure was performed on the right side exactly was performed on the left side utilizing the same technique   The patient tolerated injection well. A total of 10 mg of Kenalog was utilized for the procedure.   PLAN:   1. Medications: Will continue presently prescribed medications. Hydrocodone acetaminophen 2. Will consider modification of treatment regimen pending response to treatment rendered on today's visit and follow-up evaluation. 3. The patient is to follow-up with primary care physician Dr.Hande regarding blood pressure and general medical condition status pose procedure performed on today's visit. 4. Surgical evaluation as discussed. 5. Neurological evaluation as discussed. 6. The patient may be a candidate for radiofrequency procedures, Botox injections, implantation type procedures, and other treatment pending response to treatment rendered on today's visit and follow-up evaluation. 7. The patient has been advised to adhere to proper body mechanics and avoid activities which appear to aggravate condition. 8. The patient has been advised to call the Pain Management Center prior to scheduled return appointment should there be significant change in condition or should patient have other concerns regarding condition prior to scheduled return appointment.  The patient is understanding and in agreement with suggested treatment plan.    Review of Systems     Objective:   Physical Exam        Assessment & Plan:

## 2015-04-03 NOTE — Progress Notes (Signed)
Safety precautions to be maintained throughout the outpatient stay will include: orient to surroundings, keep bed in low position, maintain call bell within reach at all times, provide assistance with transfer out of bed and ambulation.  

## 2015-04-03 NOTE — Patient Instructions (Addendum)
Continue present medication hydrocodone acetaminophen  F/U PCP Dr.Hande  for evaliation of  BP and general medical  condition  F/U surgical evaluationAs discussed  F/U neurological evaluation  May consider radiofrequency rhizolysis or intraspinal procedures pending response to present treatment and F/U evaluation   Patient to call Pain Management Center should patient have concerns prior to scheduled return appointmen.   Pain Management Discharge Instructions  General Discharge Instructions :  If you need to reach your doctor call: Monday-Friday 8:00 am - 4:00 pm at [phone] or toll free [phone].  After clinic hours [phone] to have operator reach doctor.  Bring all of your medication bottles to all your appointments in the pain clinic.  To cancel or reschedule your appointment with Pain Management please remember to call 24 hours in advance to avoid a fee.  Refer to the educational materials which you have been given on: General Risks, I had my Procedure. Discharge Instructions, Post Sedation.  Post Procedure Instructions:  Please notify your doctor immediately if you have any unusual bleeding, trouble breathing or pain that is not related to your normal pain.  Depending on the type of procedure that was done, some parts of your body may feel week and/or numb.  This usually clears up by tonight or the next day.  Walk with the use of an assistive device or accompanied by an adult for the 24 hours.  You may use ice on the affected area for the first 24 hours.  Put ice in a Ziploc bag and cover with a towel and place against area 15 minutes on 15 minutes off.  You may switch to heat after 24 hours.

## 2015-04-04 ENCOUNTER — Telehealth: Payer: Self-pay | Admitting: *Deleted

## 2015-04-04 NOTE — Telephone Encounter (Signed)
Doing ok, post procedure phone call

## 2015-04-05 ENCOUNTER — Ambulatory Visit: Payer: Medicare Other | Admitting: Family Medicine

## 2015-04-08 ENCOUNTER — Inpatient Hospital Stay: Payer: Medicare Other | Attending: Oncology | Admitting: Oncology

## 2015-04-08 VITALS — BP 155/84 | HR 79 | Temp 97.1°F | Resp 18 | Wt 185.8 lb

## 2015-04-08 DIAGNOSIS — J449 Chronic obstructive pulmonary disease, unspecified: Secondary | ICD-10-CM | POA: Insufficient documentation

## 2015-04-08 DIAGNOSIS — C50911 Malignant neoplasm of unspecified site of right female breast: Secondary | ICD-10-CM

## 2015-04-08 DIAGNOSIS — Z79899 Other long term (current) drug therapy: Secondary | ICD-10-CM | POA: Insufficient documentation

## 2015-04-08 DIAGNOSIS — Z9223 Personal history of estrogen therapy: Secondary | ICD-10-CM | POA: Insufficient documentation

## 2015-04-08 DIAGNOSIS — Z853 Personal history of malignant neoplasm of breast: Secondary | ICD-10-CM | POA: Diagnosis present

## 2015-04-08 DIAGNOSIS — I10 Essential (primary) hypertension: Secondary | ICD-10-CM | POA: Insufficient documentation

## 2015-04-08 DIAGNOSIS — Z7982 Long term (current) use of aspirin: Secondary | ICD-10-CM | POA: Insufficient documentation

## 2015-04-12 NOTE — Progress Notes (Signed)
[place]  [phone] [phone]  ID: Janice Perkins OB: 31-Aug-1932  MR#: [id number]  SEL#:[id number]  Patient Care Team: [name], MD as PCP - General (Internal Medicine)  CHIEF COMPLAINT:  Chief Complaint  Patient presents with  . Follow-up    breast cancer    INTERVAL HISTORY:  Patient returns to clinic today for routine six-month follow-up. She has now completed  Years aromatase inhibitor. She currently feels well and is asymptomatic. She has no neurologic complaints. She denies any recent fevers. She has good appetite and denies weight loss. She denies any chest pain or shortness of breath.  She denies any nausea, vomiting, constipation , or diarrhea. She has no urinary complaints. Patient feels at her baseline and offers no specific complaints today.  REVIEW OF SYSTEMS:   Review of Systems  Constitutional: Negative.     As per HPI. Otherwise, a complete review of systems is negatve.  PAST MEDICAL HISTORY: Past Medical History  Diagnosis Date  . Cancer 2011    breast  . Hypertension   . COPD (chronic obstructive pulmonary disease)   . Asthma     PAST SURGICAL HISTORY: Past Surgical History  Procedure Laterality Date  . Breast excisional biopsy Right     FAMILY HISTORY Family History  Problem Relation Age of Onset  . Hypertension Mother   . Heart disease Mother   . Cancer Mother   . Stroke Father   . Hypertension Father        ADVANCED DIRECTIVES:    HEALTH MAINTENANCE: Social History  Substance Use Topics  . Smoking status: Never Smoker   . Smokeless tobacco: Not on file  . Alcohol Use: No     Colonoscopy:  PAP:  Bone density:  Lipid panel:  Allergies  Allergen Reactions  . Penicillins Hives  . Captopril Other (See Comments)  . Enalapril Maleate     Other reaction(s): Unknown  . Seldane  [Terfenadine] Other (See Comments)  . Tape Itching  . Augmentin [Amoxicillin-Pot Clavulanate] Rash  .  Biaxin [Clarithromycin] Rash, Other (See Comments) and Hives    Other reaction(s): Unknown    Current Outpatient Prescriptions  Medication Sig Dispense Refill  . amLODipine (NORVASC) 5 MG tablet Take 2.5 mg by mouth daily.     Marland Kitchen aspirin 81 MG tablet Take 81 mg by mouth 2 (two) times daily.     Marland Kitchen atorvastatin (LIPITOR) 20 MG tablet Take 20 mg by mouth daily.    . Cholecalciferol 1000 UNIT/10ML LIQD Take 1,000 Int'l Units/day by mouth every morning.    . clorazepate (TRANXENE) 7.5 MG tablet Take 7.5 mg by mouth 2 (two) times daily as needed for anxiety.    Marland Kitchen esomeprazole (NEXIUM) 40 MG capsule Take 40 mg by mouth daily at 12 noon.    . fexofenadine (ALLEGRA) 180 MG tablet Take 180 mg by mouth daily.    . fluticasone (FLONASE) 50 MCG/ACT nasal spray Place into both nostrils daily.    . Fluticasone-Salmeterol (ADVAIR) 100-50 MCG/DOSE AEPB Inhale 1 puff into the lungs 2 (two) times daily.    . hydrALAZINE (APRESOLINE) 25 MG tablet Take 25 mg by mouth 2 (two) times daily.    Marland Kitchen HYDROcodone-acetaminophen (NORCO/VICODIN) 5-325 MG per tablet Limit 1 tab by mouth 2-3 times per day if tolerated 80 tablet 0  . ipratropium (ATROVENT) 0.02 % nebulizer solution Take 0.5 mg by nebulization 4 (four) times daily as needed.     Marland Kitchen levothyroxine (SYNTHROID, LEVOTHROID) 25  MCG tablet Take 25 mcg by mouth daily before breakfast.    . meloxicam (MOBIC) 15 MG tablet Take 15 mg by mouth as needed for pain.    . metoprolol (LOPRESSOR) 50 MG tablet Take 50 mg by mouth 2 (two) times daily.    Marland Kitchen rOPINIRole (REQUIP) 0.5 MG tablet Take 0.5 mg by mouth at bedtime.    Marland Kitchen letrozole (FEMARA) 2.5 MG tablet Take 1 tablet (2.5 mg total) by mouth daily. (Patient not taking: Reported on 04/03/2015) 90 tablet 0  . levofloxacin (LEVAQUIN) 250 MG tablet Take 250 mg by mouth daily.    . predniSONE (DELTASONE) 10 MG tablet Take 10 mg by mouth 1 day or 1 dose.     No current facility-administered medications for this visit.     OBJECTIVE: Filed Vitals:   04/08/15 1023  BP: 155/84  Pulse: 79  Temp: 97.1 F (36.2 C)  Resp: 18     Body mass index is 35.13 kg/(m^2).    ECOG FS:0 - Asymptomatic  General: Well-developed, well-nourished, no acute distress. Eyes: Pink conjunctiva, anicteric sclera. Breasts: Patient requested exam be deferred today. Lungs: Clear to auscultation bilaterally. Heart: Regular rate and rhythm. No rubs, murmurs, or gallops. Abdomen: Soft, nontender, nondistended. No organomegaly noted, normoactive bowel sounds. Musculoskeletal: No edema, cyanosis, or clubbing. Neuro: Alert, answering all questions appropriately. Cranial nerves grossly intact. Skin: No rashes or petechiae noted. Psych: Normal affect.    LAB RESULTS:  Lab Results  Component Value Date   NA 140 11/26/2014   K 3.5 11/26/2014   CL 102 11/26/2014   CO2 28 11/26/2014   GLUCOSE 118* 11/26/2014   BUN 15 11/26/2014   CREATININE 1.07* 11/26/2014   CALCIUM 9.8 11/26/2014   GFRNONAA 49* 11/26/2014   GFRAA 57* 11/26/2014    Lab Results  Component Value Date   WBC 5.7 11/26/2014   NEUTROABS 2.8 11/26/2014   HGB 11.6* 11/26/2014   HCT 35.9 11/26/2014   MCV 84 11/26/2014   PLT 186 11/26/2014     STUDIES: No results found.  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   PLAN:     1. Breast cancer: Patient has now completed 5 years of aromatase inhibitor and has discontinued.   Her most recent on December 03, 2014 was reported as BI-RADS 2. Repeat in April 2017. Return to clinic in 1 year  For routine evaluation.  2. Bone mineral density: Bone mineral density from May 2015 was reported as normal with a T score of -1.0. Now the patient has discontinued her aromatase inhibitor , this can be monitored by her primary care physician.   Patient expressed understanding and was in agreement with this plan. She also understands that She can call clinic at any time with any  questions, concerns, or complaints.     Lloyd Huger, MD   04/12/2015 1:29 PM

## 2015-04-16 ENCOUNTER — Ambulatory Visit: Payer: Medicare Other | Attending: Pain Medicine | Admitting: Pain Medicine

## 2015-04-16 ENCOUNTER — Encounter: Payer: Self-pay | Admitting: Pain Medicine

## 2015-04-16 VITALS — BP 146/66 | HR 70 | Temp 97.4°F | Resp 18 | Ht 61.0 in | Wt 180.0 lb

## 2015-04-16 DIAGNOSIS — M5126 Other intervertebral disc displacement, lumbar region: Secondary | ICD-10-CM | POA: Insufficient documentation

## 2015-04-16 DIAGNOSIS — M5136 Other intervertebral disc degeneration, lumbar region: Secondary | ICD-10-CM | POA: Diagnosis not present

## 2015-04-16 DIAGNOSIS — M5481 Occipital neuralgia: Secondary | ICD-10-CM

## 2015-04-16 DIAGNOSIS — M51369 Other intervertebral disc degeneration, lumbar region without mention of lumbar back pain or lower extremity pain: Secondary | ICD-10-CM

## 2015-04-16 DIAGNOSIS — G588 Other specified mononeuropathies: Secondary | ICD-10-CM | POA: Diagnosis not present

## 2015-04-16 DIAGNOSIS — M545 Low back pain: Secondary | ICD-10-CM | POA: Diagnosis present

## 2015-04-16 DIAGNOSIS — M7060 Trochanteric bursitis, unspecified hip: Secondary | ICD-10-CM

## 2015-04-16 DIAGNOSIS — M79604 Pain in right leg: Secondary | ICD-10-CM | POA: Diagnosis present

## 2015-04-16 DIAGNOSIS — M533 Sacrococcygeal disorders, not elsewhere classified: Secondary | ICD-10-CM

## 2015-04-16 DIAGNOSIS — M47817 Spondylosis without myelopathy or radiculopathy, lumbosacral region: Secondary | ICD-10-CM

## 2015-04-16 DIAGNOSIS — M79605 Pain in left leg: Secondary | ICD-10-CM | POA: Diagnosis present

## 2015-04-16 MED ORDER — HYDROCODONE-ACETAMINOPHEN 5-325 MG PO TABS: ORAL_TABLET | ORAL | Status: DC

## 2015-04-16 NOTE — Progress Notes (Signed)
   Subjective:    Patient ID: Janice Perkins, female    DOB: 1932-09-14, 81 y.o.   MRN: [id number]  HPI  Patient is a 81 year old female returns to [place] for further evaluation and treatment of pain involving the lower back lower extremity region. Patient states she is doing really well at this time. Patient has had improvement of lower back lower extremity pain has and has been able to perform activities of daily living without significant pain interferes with activities of daily living ability to obtain restful sleep. We will continue present hydrocodone acetaminophen at this time. We will remain available to consider patient for interventional treatment should there be return appointment pain of any significant degree. The patient was understanding and agreement suggested treatment plan.     Review of Systems     Objective:   Physical Exam  There was mild tenderness of the splenius capitis and occipitalis musculature region. There was mild tenderness of the cervical facet cervical spinal musculature region. Palpation of the thoracic facet thoracic paraspinal musculature region was without increased pain of significant degree. Patient appeared to be with unremarkable Spurling's maneuver Tinel and Phalen's maneuver were without increased pain of significant patient appeared to be with bilaterally equal grip strength. There was minimal tenderness of the acromioclavicular glenohumeral joint regions. Palpation over the lower thoracic region was evidence of muscle spasms of moderate degree. Palpation over the lumbar paraspinal musculature region lumbar facet region with tinged palpation of moderate degree. There was tenderness of the PSIS and PII S region of moderate degree. Palpation of the piriformis musculature region reproduced moderate discomfort. There was no sensory deficit of dermatomal distribution. EHL strength appeared to be decreased. There was negative clonus negative  Homans. Palpation of the greater trochanteric region iliotibial band region was with mild to moderate discomfort.. There was negative clonus negative Homans. Abdomen was nontender and no costovertebral angle tenderness was noted.      Assessment & Plan:     Degenerative disc disease lumbar spine Moderate involvement L3-4 and L4-L5 with broad-based disc bulge and facet arthropathy present at L2-3, L3-4, L4-5, and L5-S1  Lumbar facet syndrome   Gluteal and piriformis neuralgia  Sacroiliac joint dysfunction  Bilateral occipital neuralg   PLAN   Continue present medication hydrocodone acetaminophen  F/U PCP Dr.Hande  for evaliation of  BP and general medical  condition  F/U surgical evaluation to be considered pending follow-up evaluation  F/U neurological evaluation May consider pending follow-up evaluations   May consider radiofrequency rhizolysis or intraspinal procedures pending response to present treatment and F/U evaluation   Patient to call [facility] should patient have concerns prior to scheduled return appointment.

## 2015-04-16 NOTE — Patient Instructions (Addendum)
Continue present medication  hydrocodone acetaminophen  F/U PCP Dr.Hande  for evaliation of  BP and general medical  condition  F/U surgical evaluation as needed  F/U neurological evaluation to be considered as needed  May consider radiofrequency rhizolysis or intraspinal procedures pending response to present treatment and F/U evaluation   Patient to call [facility] should patient have concerns prior to scheduled return appointment.

## 2015-04-18 DIAGNOSIS — J40 Bronchitis, not specified as acute or chronic: Secondary | ICD-10-CM

## 2015-04-18 HISTORY — DX: Bronchitis, not specified as acute or chronic: J40

## 2015-04-24 ENCOUNTER — Telehealth: Payer: Self-pay | Admitting: Pain Medicine

## 2015-04-24 NOTE — Telephone Encounter (Signed)
Janice Perkins and Nurses Have patient see PCP or go to the emergency room for evaluation and have emergency room physician call me Schedule patient for evaluation/procedure for  Monday, 04/29/2015 Nurses Please describe the patient's pain to me in further detail so that we can determine type of procedure which may be performed on Monday, 04/29/2015

## 2015-04-24 NOTE — Telephone Encounter (Signed)
[address] [address] out of bed Sunday and has settled sore and having a lot of pain / should she go have x-rays / please call to schedule

## 2015-04-25 ENCOUNTER — Other Ambulatory Visit: Payer: Self-pay | Admitting: Pain Medicine

## 2015-04-25 NOTE — Telephone Encounter (Signed)
Attempted to call patient, no answer 

## 2015-04-25 NOTE — Telephone Encounter (Signed)
Thank you :)

## 2015-04-25 NOTE — Telephone Encounter (Signed)
Having pain in knees, lower back, and "all over". Taking Advil, is feeliing much better. Patient advised to see PCP. Does not wish to schedule appt here for 04-29-15 at this time.

## 2015-05-14 ENCOUNTER — Encounter: Payer: Self-pay | Admitting: Pain Medicine

## 2015-05-14 ENCOUNTER — Ambulatory Visit: Payer: Medicare Other | Attending: Pain Medicine | Admitting: Pain Medicine

## 2015-05-14 VITALS — BP 134/67 | HR 63 | Temp 97.7°F | Resp 18 | Ht 61.0 in | Wt 180.0 lb

## 2015-05-14 DIAGNOSIS — M7060 Trochanteric bursitis, unspecified hip: Secondary | ICD-10-CM

## 2015-05-14 DIAGNOSIS — M19011 Primary osteoarthritis, right shoulder: Secondary | ICD-10-CM

## 2015-05-14 DIAGNOSIS — M19019 Primary osteoarthritis, unspecified shoulder: Secondary | ICD-10-CM | POA: Insufficient documentation

## 2015-05-14 DIAGNOSIS — M5136 Other intervertebral disc degeneration, lumbar region: Secondary | ICD-10-CM | POA: Diagnosis not present

## 2015-05-14 DIAGNOSIS — G588 Other specified mononeuropathies: Secondary | ICD-10-CM | POA: Insufficient documentation

## 2015-05-14 DIAGNOSIS — M533 Sacrococcygeal disorders, not elsewhere classified: Secondary | ICD-10-CM | POA: Insufficient documentation

## 2015-05-14 DIAGNOSIS — M5481 Occipital neuralgia: Secondary | ICD-10-CM | POA: Diagnosis not present

## 2015-05-14 DIAGNOSIS — M79604 Pain in right leg: Secondary | ICD-10-CM | POA: Diagnosis present

## 2015-05-14 DIAGNOSIS — M5126 Other intervertebral disc displacement, lumbar region: Secondary | ICD-10-CM | POA: Insufficient documentation

## 2015-05-14 DIAGNOSIS — M7550 Bursitis of unspecified shoulder: Secondary | ICD-10-CM | POA: Diagnosis not present

## 2015-05-14 DIAGNOSIS — M7551 Bursitis of right shoulder: Secondary | ICD-10-CM

## 2015-05-14 DIAGNOSIS — M79605 Pain in left leg: Secondary | ICD-10-CM | POA: Diagnosis present

## 2015-05-14 DIAGNOSIS — M545 Low back pain: Secondary | ICD-10-CM | POA: Diagnosis present

## 2015-05-14 DIAGNOSIS — M47817 Spondylosis without myelopathy or radiculopathy, lumbosacral region: Secondary | ICD-10-CM

## 2015-05-14 MED ORDER — HYDROCODONE-ACETAMINOPHEN 5-325 MG PO TABS: ORAL_TABLET | ORAL | Status: DC

## 2015-05-14 NOTE — Progress Notes (Signed)
Subjective:    Patient ID: Janice Perkins, female    DOB: March 29, 1933, 81 y.o.   MRN: [id number]  HPI Patient is a-year-old female who [facility] evaluation and treatment of pain involving the lower back lower extremity region. Patient states that she has severe pain involving the region of the right shoulder. He states that the pain is aggravated by reaching lifting pushing pulling and that the pain interferes with patient's ability to obtain restful sleep. We discussed patient's condition and patient states she is a just undergo interventional treatment to treat the pain of the shoulder since the pain of the shoulder has persisted despite present treatment. We discussed patient's condition and will consider patient for shoulder injection at time return appointment pending insurance approval. The patient was with understanding and in agreement with suggested treatment plan. Patient will continue hydrocodone acetaminophen as prescribed at this time.     Review of Systems     Objective:   Physical Exam  There was tenderness of the splenius capitis and occipitalis musculature region of mild degree. There was mild tennis over the region of the cervical facet cervical paraspinal musculature region as well as the thoracic facet thoracic paraspinal musculature region. There was moderately severe tenderness to palpation of the acromioclavicular and glenohumeral joint regions especially the right shoulder. Patient had mild difficulty performing the drop test. There was decreased grip strength right upper extremity compared to the left upper extremity. There appeared to be unremarkable Spurling's maneuver. Palpation over the region of the thoracic facet was without crepitus of the thoracic region. There was evidence of moderate muscle spasms of the thoracic paraspinal musculature region noted. Tinel and Phalen's maneuver were without increased pain of significant degree.  Palpation over the lumbar paraspinal muscles region lumbar facet region was with mild tends to palpation. There was no crepitus of the thoracic region noted. Over the lumbar paraspinal muscle lumbar facet region was with tenderness to palpation on the left as well as the right. Lateral bending and rotation extension and palpation of the lumbar facets reproduce mild discomfort lumbar region. There was mild tenderness of the gluteal and piriformis musculature region as well as the PSIS and PII S regions. Straight leg raising was tolerated to 30 without increased pain with dorsiflexion noted no definite sensory deficit of dermatomal distribution was detected. There was mild tinnitus of the greater trochanteric region and iliotibial band region. There was negative clonus negative Homans. Abdomen nontender with no costovertebral tenderness noted. Prominent portion of patient's pain was reproduced with palpation of the acromioclavicular glenohumeral joint region on the right.     Assessment & Plan:   Degenerative joint disease of shoulder  Bursitis of shoulder  Degenerative disc disease lumbar spine Moderate involvement L3-4 and L4-L5 with broad-based disc bulge and facet arthropathy present at L2-3, L3-4, L4-5, and L5-S1  Lumbar facet syndrome   Gluteal and piriformis neuralgia  Sacroiliac joint dysfunction  Bilateral occipital neuralga    PLAN   Continue present medication Hydrocodone acetaminophen  Injection of shoulder to be performed at time return appointment  F/U PCP Dr Ginette Pitman for evaliation of  BP and general medical  condition  F/U surgical evaluation. May consider pending follow-up evaluations  F/U neurological evaluation. May consider pending follow-up evaluations  May consider radiofrequency rhizolysis or intraspinal procedures pending response to present treatment and F/U evaluation   Patient to call [facility] should patient have concerns prior to scheduled  return appointment.

## 2015-05-14 NOTE — Patient Instructions (Addendum)
PLAN   Continue present medication hydrocodone acetaminophen  Shoulder injection to be performed at time of return appointment  F/U PCP Dr.Hande  for evaliation of  BP and general medical  condition  F/U surgical evaluation. May consider pending follow-up evaluations  F/U neurological evaluation. May consider pending follow-up evaluations  May consider radiofrequency rhizolysis or intraspinal procedures pending response to present treatment and F/U evaluation   Patient to call [facility] Center should patient have concerns prior to scheduled return appointment. Trigger Point Injection Trigger points are areas where you have muscle pain. A trigger point injection is a shot given in the trigger point to relieve that pain. A trigger point might feel like a knot in your muscle. It hurts to press on a trigger point. Sometimes the pain spreads out (radiates) to other parts of the body. For example, pressing on a trigger point in your shoulder might cause pain in your arm or neck. You might have one trigger point. Or, you might have more than one. People often have trigger points in their upper back and lower back. They also occur often in the neck and shoulders. Pain from a trigger point lasts for a long time. It can make it hard to keep moving. You might not be able to do the exercise or physical therapy that could help you deal with the pain. A trigger point injection may help. It does not work for everyone. But, it may relieve your pain for a few days or a few months. A trigger point injection does not cure long-lasting (chronic) pain. LET YOUR CAREGIVER KNOW ABOUT:  Any allergies (especially to latex, lidocaine, or steroids).  Blood-thinning medicines that you take. These drugs can lead to bleeding or bruising after an injection. They include:  Aspirin.  Ibuprofen.  Clopidogrel.  Warfarin.  Other medicines you take. This includes all vitamins, herbs, eyedrops, over-the-counter  medicines, and creams.  Use of steroids.  Recent infections.  Past problems with numbing medicines.  Bleeding problems.  Surgeries you have had.  Other health problems. RISKS AND COMPLICATIONS A trigger point injection is a safe treatment. However, problems may develop, such as:  Minor side effects usually go away in 1 to 2 days. These may include:  Soreness.  Bruising.  Stiffness.  More serious problems are rare. But, they may include:  Bleeding under the skin (hematoma).  Skin infection.  Breaking off of the needle under your skin.  Lung puncture.  The trigger point injection may not work for you. BEFORE THE PROCEDURE You may need to stop taking any medicine that thins your blood. This is to prevent bleeding and bruising. Usually these medicines are stopped several days before the injection. No other preparation is needed. PROCEDURE  A trigger point injection can be given in your caregiver's office or in a clinic. Each injection takes 2 minutes or less.  Your caregiver will feel for trigger points. The caregiver may use a marker to circle the area for the injection.  The skin over the trigger point will be washed with a germ-killing (antiseptic) solution.  The caregiver pinches the spot for the injection.  Then, a very thin needle is used for the shot. You may feel pain or a twitching feeling when the needle enters the trigger point.  A numbing solution may be injected into the trigger point. Sometimes a drug to keep down swelling, redness, and warmth (inflammation) is also injected.  Your caregiver moves the needle around the trigger zone until the  tightness and twitching goes away.  After the injection, your caregiver may put gentle pressure over the injection site.  Then it is covered with a bandage. AFTER THE PROCEDURE  You can go right home after the injection.  The bandage can be taken off after a few hours.  You may feel sore and stiff for 1 to 2  days.  Go back to your regular activities slowly. Your caregiver may ask you to stretch your muscles. Do not do anything that takes extra energy for a few days.  Follow your caregiver's instructions to manage and treat other pain. Document Released: 07/23/2011 Document Revised: 11/28/2012 Document Reviewed: 07/23/2011 [employer] Patient Information 2015 [place], [place]. This information is not intended to replace advice given to you by your health care provider. Make sure you discuss any questions you have with your health care provider.

## 2015-05-15 ENCOUNTER — Ambulatory Visit: Payer: Medicare Other | Attending: Pain Medicine | Admitting: Pain Medicine

## 2015-05-15 ENCOUNTER — Encounter: Payer: Self-pay | Admitting: Pain Medicine

## 2015-05-15 VITALS — BP 143/79 | HR 68 | Temp 98.2°F | Resp 18 | Ht 61.0 in | Wt 180.0 lb

## 2015-05-15 DIAGNOSIS — M19011 Primary osteoarthritis, right shoulder: Secondary | ICD-10-CM

## 2015-05-15 DIAGNOSIS — M5481 Occipital neuralgia: Secondary | ICD-10-CM

## 2015-05-15 DIAGNOSIS — M47817 Spondylosis without myelopathy or radiculopathy, lumbosacral region: Secondary | ICD-10-CM

## 2015-05-15 DIAGNOSIS — M25511 Pain in right shoulder: Secondary | ICD-10-CM | POA: Diagnosis present

## 2015-05-15 DIAGNOSIS — M7060 Trochanteric bursitis, unspecified hip: Secondary | ICD-10-CM

## 2015-05-15 DIAGNOSIS — M7551 Bursitis of right shoulder: Secondary | ICD-10-CM

## 2015-05-15 DIAGNOSIS — M51369 Other intervertebral disc degeneration, lumbar region without mention of lumbar back pain or lower extremity pain: Secondary | ICD-10-CM

## 2015-05-15 DIAGNOSIS — M5136 Other intervertebral disc degeneration, lumbar region: Secondary | ICD-10-CM

## 2015-05-15 DIAGNOSIS — M533 Sacrococcygeal disorders, not elsewhere classified: Secondary | ICD-10-CM

## 2015-05-15 MED ORDER — BUPIVACAINE HCL (PF) 0.25 % IJ SOLN
INTRAMUSCULAR | Status: AC
  Administered 2015-05-15: 11:00:00
  Filled 2015-05-15: qty 30

## 2015-05-15 MED ORDER — METHYLPREDNISOLONE ACETATE 40 MG/ML IJ SUSP
INTRAMUSCULAR | Status: AC
  Administered 2015-05-15: 11:00:00
  Filled 2015-05-15: qty 1

## 2015-05-15 NOTE — Progress Notes (Signed)
Safety precautions to be maintained throughout the outpatient stay will include: orient to surroundings, keep bed in low position, maintain call bell within reach at all times, provide assistance with transfer out of bed and ambulation.  

## 2015-05-15 NOTE — Patient Instructions (Addendum)
PLAN   Continue present medication hydrocodone acetaminophen   F/U PCP Dr.Hande  for evaliation of  BP and general medical  condition  F/U surgical evaluation. May consider pending follow-up evaluations  F/U neurological evaluation. May consider pending follow-up evaluations  May consider radiofrequency rhizolysis or intraspinal procedures pending response to present treatment and F/U evaluation   Patient to call [facility] Management Center should patient have concerns prior to scheduled return appointment.   Pain Management Discharge Instructions  General Discharge Instructions :  If you need to reach your doctor call: Monday-Friday 8:00 am - 4:00 pm at [phone] or toll free [phone].  After clinic hours [phone] to have operator reach doctor.  Bring all of your medication bottles to all your appointments in the pain clinic.  To cancel or reschedule your appointment with Pain Management please remember to call 24 hours in advance to avoid a fee.  Refer to the educational materials which you have been given on: General Risks, I had my Procedure. Discharge Instructions, Post Sedation.  Post Procedure Instructions:  Please notify your doctor immediately if you have any unusual bleeding, trouble breathing or pain that is not related to your normal pain.  Depending on the type of procedure that was done, some parts of your body may feel week and/or numb.  This usually clears up by tonight or the next day.  Walk with the use of an assistive device or accompanied by an adult for the 24 hours.  You may use ice on the affected area for the first 24 hours.  Put ice in a Ziploc bag and cover with a towel and place against area 15 minutes on 15 minutes off.  You may switch to heat after 24 hours.

## 2015-05-15 NOTE — Progress Notes (Signed)
   Subjective:    Patient ID: Janice Perkins, female    DOB: [birth year], 81 y.o.   MRN: [id number]  HPI                                         RIGHT SHOULDER INJECTION   The patient is an 81 year old female who returns to [place] for further evaluation and treatment of pain involving the right shoulder. Patient is a severe pain of the right shoulder following a fall. Patient has had difficulty performing reaching lifting pushing and pulling maneuvers especially. There is concern regarding intra-articularly boxes of the shoulder contributing to patient's symptomatology. Patient is with known degenerative changes of the shoulder  We discussed patient's condition on today's visit and decision has been made to proceed with interventional treatment consisting of right shoulder injection in attempt to decrease severity of symptoms, decrease progression of symptoms, and avoid the need for more involved treatment. The patient was in agreement with suggested treatment plan    Description Of Procedure:  Right Shoulder Injection  With the patient in sitting position with EKG, blood pressure, pulse, and pulse oximetry monitoring in place, identification of landmarks for right shoulder injection was accomplished.Following Betadine prep of proposed entry site procedure was performed  Right Shoulder Injection:  Anterior Approach Following Betadine prep of proposed entry site and identification of landmarks for right shoulder injection anterior approach a  22-gauge needle was inserted in the region of the right shoulder and a total of 2 cc of 0.25% bupivacaine with Depo-Medrol was injected for right shoulder injection anterior approach  Right Shoulder Injection:  Posterior Approach Following Betadine prep of proposed entry site and identification of landmarks for right shoulder injection posterior approach a 22-gauge needle was inserted in the region of the right shoulder and a total of 2 cc of  0.25% bupivacaine with Depo-Medrol was injected for right shoulder injection posterior approach  The patient tolerated procedure well  a total of 40 mg of Depo-Medrol was utilized for the procedure   PLAN   Continue present medication Hydrocodone acetaminophen  F/U PCP  Dr Ginette Pitman for evaliation of  BP and general medical  condition  F/U surgical evaluation. May consider pending follow-up evaluations  F/U neurological evaluation. May consider pending follow-up evaluations  May consider additional studies pending response to treatment and follow-up evaluation as discussed with patient  May consider physical therapy and other treatments as discussed with patient pending response to treatment and follow-up evaluation  May consider radiofrequency rhizolysis or intraspinal procedures pending response to present treatment and F/U evaluation   Patient to call [facility] Center should patient have concerns prior to scheduled return appointment.      Review of Systems     Objective:   Physical Exam        Assessment & Plan:

## 2015-05-16 ENCOUNTER — Ambulatory Visit: Payer: Medicare Other | Admitting: Pain Medicine

## 2015-05-16 ENCOUNTER — Telehealth: Payer: Self-pay | Admitting: *Deleted

## 2015-05-16 NOTE — Telephone Encounter (Signed)
Left voice mail

## 2015-06-13 ENCOUNTER — Ambulatory Visit: Payer: Medicare Other | Attending: Pain Medicine | Admitting: Pain Medicine

## 2015-06-13 ENCOUNTER — Encounter: Payer: Self-pay | Admitting: Pain Medicine

## 2015-06-13 VITALS — BP 136/99 | HR 57 | Temp 97.9°F | Resp 18 | Ht 60.0 in | Wt 180.0 lb

## 2015-06-13 DIAGNOSIS — M5126 Other intervertebral disc displacement, lumbar region: Secondary | ICD-10-CM | POA: Diagnosis not present

## 2015-06-13 DIAGNOSIS — G588 Other specified mononeuropathies: Secondary | ICD-10-CM | POA: Diagnosis not present

## 2015-06-13 DIAGNOSIS — M47817 Spondylosis without myelopathy or radiculopathy, lumbosacral region: Secondary | ICD-10-CM

## 2015-06-13 DIAGNOSIS — M533 Sacrococcygeal disorders, not elsewhere classified: Secondary | ICD-10-CM

## 2015-06-13 DIAGNOSIS — M19019 Primary osteoarthritis, unspecified shoulder: Secondary | ICD-10-CM | POA: Diagnosis not present

## 2015-06-13 DIAGNOSIS — M5136 Other intervertebral disc degeneration, lumbar region: Secondary | ICD-10-CM | POA: Insufficient documentation

## 2015-06-13 DIAGNOSIS — M51369 Other intervertebral disc degeneration, lumbar region without mention of lumbar back pain or lower extremity pain: Secondary | ICD-10-CM

## 2015-06-13 DIAGNOSIS — M7060 Trochanteric bursitis, unspecified hip: Secondary | ICD-10-CM

## 2015-06-13 DIAGNOSIS — M5382 Other specified dorsopathies, cervical region: Secondary | ICD-10-CM | POA: Insufficient documentation

## 2015-06-13 DIAGNOSIS — M19011 Primary osteoarthritis, right shoulder: Secondary | ICD-10-CM

## 2015-06-13 DIAGNOSIS — M7551 Bursitis of right shoulder: Secondary | ICD-10-CM

## 2015-06-13 DIAGNOSIS — M5481 Occipital neuralgia: Secondary | ICD-10-CM

## 2015-06-13 DIAGNOSIS — M7550 Bursitis of unspecified shoulder: Secondary | ICD-10-CM | POA: Insufficient documentation

## 2015-06-13 DIAGNOSIS — R51 Headache: Secondary | ICD-10-CM | POA: Diagnosis present

## 2015-06-13 DIAGNOSIS — M259 Joint disorder, unspecified: Secondary | ICD-10-CM | POA: Insufficient documentation

## 2015-06-13 MED ORDER — HYDROCODONE-ACETAMINOPHEN 5-325 MG PO TABS: ORAL_TABLET | ORAL | Status: DC

## 2015-06-13 NOTE — Progress Notes (Signed)
Safety precautions to be maintained throughout the outpatient stay will include: orient to surroundings, keep bed in low position, maintain call bell within reach at all times, provide assistance with transfer out of bed and ambulation.  

## 2015-06-13 NOTE — Patient Instructions (Signed)
PLAN   Continue present medication hydrocodone acetaminophen  F/U PCP Dr.Hande  for evaliation of  BP and general medical  condition  F/U surgical evaluation. May consider pending follow-up evaluations  F/U neurological evaluation. May consider pending follow-up evaluations  May consider radiofrequency rhizolysis or intraspinal procedures pending response to present treatment and F/U evaluation   Patient to call [facility] Management Center should patient have concerns prior to scheduled return appointment.

## 2015-06-13 NOTE — Progress Notes (Signed)
   Subjective:    Patient ID: Janice Perkins, female    DOB: July 04, 1933, 80 y.o.   MRN: [id number]  HPI  Patient is a 81 year old female returns to [place] for further evaluation and treatment of pain involving the region of the neck upper back region headaches as well as the lower back and lower extremity region. Patient stated that she recently was distant relative in the hospital and had to assist with care of the relative who was patient in Hospital. Patient states that this appears to have aggravated the lower back lower extremity region and call significant muscle spasms. Patient is undergone follow-up evaluation with her primary care physician Dr. Ginette Pitman felt that patient had increased muscle spasm. At the present time patient will continue presently prescribed medications and we will remain available to consider patient for interventional treatment should the lower back lower extremity pain persists despite the present treatment. The patient will call Pain Management Center undergo further treatment should the symptoms persist. All understanding and in agreement status treatment plan   Review of Systems     Objective:   Physical Exam  There was tennis of the splenius capitis and occipitalis musculature region of mild to moderate degree. Palpation of the cervical facet cervical paraspinal muscles reproduced mild to moderate discomfort. Palpation over the region of the acromioclavicular and glenohumeral joint regions were without increased pain of significant degree. Palpation over the region of the thoracic facet and cervical facet regions reproduced pain of mild to moderate degree. No crepitus of the thoracic region was noted. Tinel and Phalen's maneuver associated with mild discomfort with no significant increase of pain with Tinel and Phalen's maneuver. Palpation of the lower thoracic paraspinal musculature region of the lower thoracic facet region was a tends to palpation of  moderate degree. Palpation over the lumbar paraspinal muscles lumbar facet region reproduces moderate discomfort. Straight leg raising was tolerates approximately 30 without a definite increased pain with dorsiflexion noted. EHL strength appeared to be slightly decreased. There was negative clonus negative Homans. DTRs were difficult to elicit. Patient with difficulty relaxing. There was mild tinnitus of the PSIS and PII S region of mild tenderness of the greater trochanteric region and iliotibial band region. There was negative clonus negative Homans. Abdomen was nontender with no costovertebral tenderness noted.    Assessment & Plan:      Degenerative joint disease of shoulder  Bursitis of shoulder  Degenerative disc disease lumbar spine Moderate involvement L3-4 and L4-L5 with broad-based disc bulge and facet arthropathy present at L2-3, L3-4, L4-5, and L5-S1  Lumbar facet syndrome   Gluteal and piriformis neuralgia  Sacroiliac joint dysfunction  Piriformis syndrome   PLAN   Continue present medication hydrocodone acetaminophen  F/U PCP Dr Ginette Pitman for evaliation of  BP and general medical  condition  F/U surgical evaluation. May consider pending follow-up evaluations  F/U neurological evaluation. May consider pending follow-up evaluations  May consider radiofrequency rhizolysis or intraspinal procedures pending response to present treatment and F/U evaluation   Patient to call Pain Management Center should patient have concerns prior to scheduled return appointment.

## 2015-06-19 ENCOUNTER — Telehealth: Payer: Self-pay | Admitting: Pain Medicine

## 2015-06-19 NOTE — Telephone Encounter (Signed)
Attempted to call, line busy. 

## 2015-06-19 NOTE — Telephone Encounter (Signed)
Janice Perkins, and Janice Perkins  Please schedule patient for procedure for Monday, 06/24/2015 Ask patient to bring a driver if she can. I can perform the procedure without patient bringing a driver  Iff she is unable to have anyone bring her, she may drive herself

## 2015-06-19 NOTE — Telephone Encounter (Signed)
May have pulled a muscle and is having extra pain / would like to speak

## 2015-06-19 NOTE — Telephone Encounter (Signed)
Pain in right side of the body, above the hip.Spoke with Dr. Primus Bravo about this at last visit, given a "muscle relaxer", but pain is worse.

## 2015-07-10 ENCOUNTER — Other Ambulatory Visit: Payer: Self-pay | Admitting: Pain Medicine

## 2015-07-15 ENCOUNTER — Ambulatory Visit: Payer: Medicare Other | Attending: Pain Medicine | Admitting: Pain Medicine

## 2015-07-15 ENCOUNTER — Encounter: Payer: Self-pay | Admitting: Pain Medicine

## 2015-07-15 VITALS — BP 152/81 | HR 77 | Temp 98.0°F | Resp 16 | Ht 61.0 in | Wt 182.0 lb

## 2015-07-15 DIAGNOSIS — M533 Sacrococcygeal disorders, not elsewhere classified: Secondary | ICD-10-CM

## 2015-07-15 DIAGNOSIS — M19011 Primary osteoarthritis, right shoulder: Secondary | ICD-10-CM

## 2015-07-15 DIAGNOSIS — M5481 Occipital neuralgia: Secondary | ICD-10-CM

## 2015-07-15 DIAGNOSIS — M79605 Pain in left leg: Secondary | ICD-10-CM | POA: Diagnosis present

## 2015-07-15 DIAGNOSIS — M5126 Other intervertebral disc displacement, lumbar region: Secondary | ICD-10-CM | POA: Diagnosis not present

## 2015-07-15 DIAGNOSIS — M47817 Spondylosis without myelopathy or radiculopathy, lumbosacral region: Secondary | ICD-10-CM

## 2015-07-15 DIAGNOSIS — M7060 Trochanteric bursitis, unspecified hip: Secondary | ICD-10-CM

## 2015-07-15 DIAGNOSIS — M5136 Other intervertebral disc degeneration, lumbar region: Secondary | ICD-10-CM | POA: Diagnosis not present

## 2015-07-15 DIAGNOSIS — M79604 Pain in right leg: Secondary | ICD-10-CM | POA: Diagnosis present

## 2015-07-15 DIAGNOSIS — M7551 Bursitis of right shoulder: Secondary | ICD-10-CM

## 2015-07-15 DIAGNOSIS — M545 Low back pain: Secondary | ICD-10-CM | POA: Diagnosis present

## 2015-07-15 DIAGNOSIS — M51369 Other intervertebral disc degeneration, lumbar region without mention of lumbar back pain or lower extremity pain: Secondary | ICD-10-CM

## 2015-07-15 MED ORDER — TRIAMCINOLONE ACETONIDE 40 MG/ML IJ SUSP
40.0000 mg | Freq: Once | INTRAMUSCULAR | Status: AC
  Administered 2015-07-15: 11:00:00

## 2015-07-15 MED ORDER — SODIUM CHLORIDE 0.9 % IJ SOLN
INTRAMUSCULAR | Status: AC
  Administered 2015-07-15: 11:00:00
  Filled 2015-07-15: qty 20

## 2015-07-15 MED ORDER — BUPIVACAINE HCL (PF) 0.25 % IJ SOLN
INTRAMUSCULAR | Status: AC
  Filled 2015-07-15: qty 30

## 2015-07-15 MED ORDER — HYDROCODONE-ACETAMINOPHEN 5-325 MG PO TABS: ORAL_TABLET | ORAL | Status: DC

## 2015-07-15 MED ORDER — ORPHENADRINE CITRATE 30 MG/ML IJ SOLN
INTRAMUSCULAR | Status: AC
  Filled 2015-07-15: qty 2

## 2015-07-15 MED ORDER — SODIUM CHLORIDE 0.9 % IJ SOLN: 20.0000 mL | Freq: Once | INTRAMUSCULAR | Status: DC

## 2015-07-15 MED ORDER — BUPIVACAINE HCL (PF) 0.25 % IJ SOLN
30.0000 mL | Freq: Once | INTRAMUSCULAR | Status: AC
  Administered 2015-07-15: 11:00:00

## 2015-07-15 MED ORDER — TRIAMCINOLONE ACETONIDE 40 MG/ML IJ SUSP
INTRAMUSCULAR | Status: AC
  Filled 2015-07-15: qty 1

## 2015-07-15 MED ORDER — ORPHENADRINE CITRATE 30 MG/ML IJ SOLN: 60.0000 mg | Freq: Once | INTRAMUSCULAR | Status: DC

## 2015-07-15 NOTE — Progress Notes (Signed)
Safety precautions to be maintained throughout the outpatient stay will include: orient to surroundings, keep bed in low position, maintain call bell within reach at all times, provide assistance with transfer out of bed and ambulation.  

## 2015-07-15 NOTE — Patient Instructions (Addendum)
PLAN   Continue present medication hydrocodone acetaminophen  F/U PCP Dr.Hande  for evaliation of  BP and general medical  condition  F/U surgical evaluation. May consider pending follow-up evaluations  F/U neurological evaluation. May consider pending follow-up evaluations  May consider radiofrequency rhizolysis or intraspinal procedures pending response to present treatment and F/U evaluation   Patient to call Pain Management Center should patient have concerns prior to scheduled return appointment.Pain Management Discharge Instructions  General Discharge Instructions :  If you need to reach your doctor call: Monday-Friday 8:00 am - 4:00 pm at [phone] or toll free [phone].  After clinic hours [phone] to have operator reach doctor.  Bring all of your medication bottles to all your appointments in the pain clinic.  To cancel or reschedule your appointment with Pain Management please remember to call 24 hours in advance to avoid a fee.  Refer to the educational materials which you have been given on: General Risks, I had my Procedure. Discharge Instructions, Post Sedation.  Post Procedure Instructions:  The drugs you were given will stay in your system until tomorrow, so for the next 24 hours you should not drive, make any legal decisions or drink any alcoholic beverages.  You may eat anything you prefer, but it is better to start with liquids then soups and crackers, and gradually work up to solid foods.  Please notify your doctor immediately if you have any unusual bleeding, trouble breathing or pain that is not related to your normal pain.  Depending on the type of procedure that was done, some parts of your body may feel week and/or numb.  This usually clears up by tonight or the next day.  Walk with the use of an assistive device or accompanied by an adult for the 24 hours.  You may use ice on the affected area for the first 24 hours.  Put ice in a Ziploc bag  and cover with a towel and place against area 15 minutes on 15 minutes off.  You may switch to heat after 24 hours.Trigger Point Injection Trigger points are areas where you have muscle pain. A trigger point injection is a shot given in the trigger point to relieve that pain. A trigger point might feel like a knot in your muscle. It hurts to press on a trigger point. Sometimes the pain spreads out (radiates) to other parts of the body. For example, pressing on a trigger point in your shoulder might cause pain in your arm or neck. You might have one trigger point. Or, you might have more than one. People often have trigger points in their upper back and lower back. They also occur often in the neck and shoulders. Pain from a trigger point lasts for a long time. It can make it hard to keep moving. You might not be able to do the exercise or physical therapy that could help you deal with the pain. A trigger point injection may help. It does not work for everyone. But, it may relieve your pain for a few days or a few months. A trigger point injection does not cure long-lasting (chronic) pain. LET YOUR CAREGIVER KNOW ABOUT:  Any allergies (especially to latex, lidocaine, or steroids).  Blood-thinning medicines that you take. These drugs can lead to bleeding or bruising after an injection. They include:  Aspirin.  Ibuprofen.  Clopidogrel.  Warfarin.  Other medicines you take. This includes all vitamins, herbs, eyedrops, over-the-counter medicines, and creams.  Use of steroids.  Recent infections.  Past problems with numbing medicines.  Bleeding problems.  Surgeries you have had.  Other health problems. RISKS AND COMPLICATIONS A trigger point injection is a safe treatment. However, problems may develop, such as:  Minor side effects usually go away in 1 to 2 days. These may include:  Soreness.  Bruising.  Stiffness.  More serious problems are rare. But, they may include:  Bleeding  under the skin (hematoma).  Skin infection.  Breaking off of the needle under your skin.  Lung puncture.  The trigger point injection may not work for you. BEFORE THE PROCEDURE You may need to stop taking any medicine that thins your blood. This is to prevent bleeding and bruising. Usually these medicines are stopped several days before the injection. No other preparation is needed. PROCEDURE  A trigger point injection can be given in your caregiver's office or in a clinic. Each injection takes 2 minutes or less.  Your caregiver will feel for trigger points. The caregiver may use a marker to circle the area for the injection.  The skin over the trigger point will be washed with a germ-killing (antiseptic) solution.  The caregiver pinches the spot for the injection.  Then, a very thin needle is used for the shot. You may feel pain or a twitching feeling when the needle enters the trigger point.  A numbing solution may be injected into the trigger point. Sometimes a drug to keep down swelling, redness, and warmth (inflammation) is also injected.  Your caregiver moves the needle around the trigger zone until the tightness and twitching goes away.  After the injection, your caregiver may put gentle pressure over the injection site.  Then it is covered with a bandage. AFTER THE PROCEDURE  You can go right home after the injection.  The bandage can be taken off after a few hours.  You may feel sore and stiff for 1 to 2 days.  Go back to your regular activities slowly. Your caregiver may ask you to stretch your muscles. Do not do anything that takes extra energy for a few days.  Follow your caregiver's instructions to manage and treat other pain.   This information is not intended to replace advice given to you by your health care provider. Make sure you discuss any questions you have with your health care provider.   Document Released: 07/23/2011 Document Revised: 11/28/2012  Document Reviewed: 07/23/2011 Elsevier Interactive Patient Education [employer].

## 2015-07-15 NOTE — Progress Notes (Signed)
Subjective:    Patient ID: Janice Perkins, female    DOB: 03-Jan-1933, 81 y.o.   MRN: [id number]  HPI  PROCEDURE PERFORMED: Cluneal sciatic nerve block   NOTE: The patient is a 81 y.o. female who returns to [place] for further evaluation and treatment of pain involving the lumbar and lower extremity region with pain occurring in the region of the buttocks and lower extremity of significant degree. Prior studies revealed the patient to be with MRI findings of Degenerative disc disease lumbar spine Moderate involvement L3-4 and L4-L5 with broad-based disc bulge and facet arthropathy present at L2-3, L3-4, L4-5, and L5-S1 the patient is status post fall with severe pain occurring the lower back and lower extremity region with palpation over the gluteal and piriformis musculature regions reproducing severe disabling pain. There is concern regarding component of patient's pain being due to gluteal and piriformis neuralgia associated with severe muscle spasms. The risks, benefits, and expectations of the procedure have been discussed and explained to the patient who was understanding and in agreement with suggested treatment plan. We will proceed with interventional treatment as discussed and as explained to the patient who wishes to proceed with proposed treatment.   PROCEDURE #1: Left cluneal nerve block with EKG, blood pressure, pulse, and pulse oximetry monitoring. The procedure was performed with the patient in the lateral decubitus position. Following alcohol prep of proposed entry site and identification of landmarks, a 22 -gauge needle was inserted into the gluteal musculature region and following elicitation of paresthesias radiating to the buttocks, needle was slightly withdrawn and following negative aspiration, a total of 4 mL of 0.25% bupivacaine with Kenalog injected for left  cluneal nerve block. Needle removed. The patient tolerated the injection well.   PROCEDURE #2: Left  sciatic nerve block with EKG, blood pressure, pulse, and pulse oximetry monitoring. The procedure was performed with the patient in the lateral decubitus position. Following identification of greater trochanter for establishing point of needle entry and alcohol prep of proposed entry site, a 22 -gauge needle was inserted and following elicitation of paresthesias radiating from buttocks to the foot, needle was slightly withdrawn. Following negative aspiration, a total of 4 mL of 0.25% bupivacaine with Kenalog injected for left sciatic nerve block. Needle removed. The patient tolerated injection well. A total of 10 mg of Kenalog was utilized for the procedure.   Cluneal And Sciatic Nerve Blocks Performed On The Right Side: The cluneal and sciatic nerve blocks were performed on the right side exactly as was performed on the left side utilizing the same technique and same medications  The patient tolerated the procedure well A total of 10 mg of Kenalog was utilized for the entire procedure   1. The patient is to follow-up with primary care physician regarding blood pressure and general medical condition status pose procedure performed on today's visit. The patient will continue hydrocodone acetaminophen medication as prescribed this time 2. Surgical evaluation as discussed. 3. Neurological evaluation as discussed. 4. The patient may be a candidate for radiofrequency procedures, Botox injections, implantation type procedures, and other treatment pending response to treatment rendered on today's visit and follow-up evaluation. 5. The patient has been advised to adhere to proper body mechanics and avoid activities which appear to aggravate condition. 6. The patient has been advised to call the Pain Management Center prior to scheduled return appointment should there be significant change in condition or should patient have other concerns regarding condition prior to scheduled return appointment.  The  patient is understanding and in agreement with suggested treatment plan.    Review of Systems     Objective:   Physical Exam        Assessment & Plan:

## 2015-07-16 ENCOUNTER — Encounter: Payer: Medicare Other | Admitting: Pain Medicine

## 2015-07-16 ENCOUNTER — Telehealth: Payer: Self-pay | Admitting: *Deleted

## 2015-07-16 NOTE — Telephone Encounter (Signed)
Patient verbalizes no complications from procedure.  

## 2015-07-18 DEATH — deceased

## 2015-07-26 ENCOUNTER — Other Ambulatory Visit: Payer: Self-pay | Admitting: Pain Medicine

## 2015-08-13 ENCOUNTER — Ambulatory Visit: Payer: Medicare Other | Attending: Pain Medicine | Admitting: Pain Medicine

## 2015-08-13 ENCOUNTER — Encounter: Payer: Self-pay | Admitting: Pain Medicine

## 2015-08-13 VITALS — BP 129/57 | HR 74 | Temp 97.7°F | Resp 16 | Ht 61.0 in | Wt 182.0 lb

## 2015-08-13 DIAGNOSIS — M533 Sacrococcygeal disorders, not elsewhere classified: Secondary | ICD-10-CM | POA: Insufficient documentation

## 2015-08-13 DIAGNOSIS — M5126 Other intervertebral disc displacement, lumbar region: Secondary | ICD-10-CM | POA: Insufficient documentation

## 2015-08-13 DIAGNOSIS — M545 Low back pain: Secondary | ICD-10-CM | POA: Diagnosis present

## 2015-08-13 DIAGNOSIS — G588 Other specified mononeuropathies: Secondary | ICD-10-CM | POA: Diagnosis not present

## 2015-08-13 DIAGNOSIS — M47817 Spondylosis without myelopathy or radiculopathy, lumbosacral region: Secondary | ICD-10-CM

## 2015-08-13 DIAGNOSIS — M19019 Primary osteoarthritis, unspecified shoulder: Secondary | ICD-10-CM | POA: Diagnosis not present

## 2015-08-13 DIAGNOSIS — M79604 Pain in right leg: Secondary | ICD-10-CM | POA: Diagnosis present

## 2015-08-13 DIAGNOSIS — M7550 Bursitis of unspecified shoulder: Secondary | ICD-10-CM | POA: Insufficient documentation

## 2015-08-13 DIAGNOSIS — M5136 Other intervertebral disc degeneration, lumbar region: Secondary | ICD-10-CM | POA: Diagnosis not present

## 2015-08-13 DIAGNOSIS — M7060 Trochanteric bursitis, unspecified hip: Secondary | ICD-10-CM

## 2015-08-13 DIAGNOSIS — M7551 Bursitis of right shoulder: Secondary | ICD-10-CM

## 2015-08-13 DIAGNOSIS — M5481 Occipital neuralgia: Secondary | ICD-10-CM

## 2015-08-13 DIAGNOSIS — M79605 Pain in left leg: Secondary | ICD-10-CM | POA: Diagnosis present

## 2015-08-13 DIAGNOSIS — M19011 Primary osteoarthritis, right shoulder: Secondary | ICD-10-CM

## 2015-08-13 MED ORDER — HYDROCODONE-ACETAMINOPHEN 5-325 MG PO TABS: ORAL_TABLET | ORAL | Status: DC

## 2015-08-13 NOTE — Patient Instructions (Addendum)
PLAN   Continue present medication hydrocodone acetaminophen  Cluneal and sciatic nerve blocks to be performed at time return appointment  F/U PCP Dr.Hande  for evaliation of  BP and general medical  condition  F/U surgical evaluation. May consider pending follow-up evaluations  F/U neurological evaluation. May consider pending follow-up evaluations  May consider radiofrequency rhizolysis or intraspinal procedures pending response to present treatment and F/U evaluation   Patient to call Pain Management Center should patient have concerns prior to scheduled return appointment.Trigger Point Injection Trigger points are areas where you have muscle pain. A trigger point injection is a shot given in the trigger point to relieve that pain. A trigger point might feel like a knot in your muscle. It hurts to press on a trigger point. Sometimes the pain spreads out (radiates) to other parts of the body. For example, pressing on a trigger point in your shoulder might cause pain in your arm or neck. You might have one trigger point. Or, you might have more than one. People often have trigger points in their upper back and lower back. They also occur often in the neck and shoulders. Pain from a trigger point lasts for a long time. It can make it hard to keep moving. You might not be able to do the exercise or physical therapy that could help you deal with the pain. A trigger point injection may help. It does not work for everyone. But, it may relieve your pain for a few days or a few months. A trigger point injection does not cure long-lasting (chronic) pain. LET YOUR CAREGIVER KNOW ABOUT:  Any allergies (especially to latex, lidocaine, or steroids).  Blood-thinning medicines that you take. These drugs can lead to bleeding or bruising after an injection. They include:  Aspirin.  Ibuprofen.  Clopidogrel.  Warfarin.  Other medicines you take. This includes all vitamins, herbs, eyedrops,  over-the-counter medicines, and creams.  Use of steroids.  Recent infections.  Past problems with numbing medicines.  Bleeding problems.  Surgeries you have had.  Other health problems. RISKS AND COMPLICATIONS A trigger point injection is a safe treatment. However, problems may develop, such as:  Minor side effects usually go away in 1 to 2 days. These may include:  Soreness.  Bruising.  Stiffness.  More serious problems are rare. But, they may include:  Bleeding under the skin (hematoma).  Skin infection.  Breaking off of the needle under your skin.  Lung puncture.  The trigger point injection may not work for you. BEFORE THE PROCEDURE You may need to stop taking any medicine that thins your blood. This is to prevent bleeding and bruising. Usually these medicines are stopped several days before the injection. No other preparation is needed. PROCEDURE  A trigger point injection can be given in your caregiver's office or in a clinic. Each injection takes 2 minutes or less.  Your caregiver will feel for trigger points. The caregiver may use a marker to circle the area for the injection.  The skin over the trigger point will be washed with a germ-killing (antiseptic) solution.  The caregiver pinches the spot for the injection.  Then, a very thin needle is used for the shot. You may feel pain or a twitching feeling when the needle enters the trigger point.  A numbing solution may be injected into the trigger point. Sometimes a drug to keep down swelling, redness, and warmth (inflammation) is also injected.  Your caregiver moves the needle around the trigger zone until  the tightness and twitching goes away.  After the injection, your caregiver may put gentle pressure over the injection site.  Then it is covered with a bandage. AFTER THE PROCEDURE  You can go right home after the injection.  The bandage can be taken off after a few hours.  You may feel sore and  stiff for 1 to 2 days.  Go back to your regular activities slowly. Your caregiver may ask you to stretch your muscles. Do not do anything that takes extra energy for a few days.  Follow your caregiver's instructions to manage and treat other pain.   This information is not intended to replace advice given to you by your health care provider. Make sure you discuss any questions you have with your health care provider.   Document Released: 07/23/2011 Document Revised: 11/28/2012 Document Reviewed: 07/23/2011 Elsevier Interactive Patient Education 2016 [employer] Pain Management Discharge Instructions  General Discharge Instructions :  If you need to reach your doctor call: Monday-Friday 8:00 am - 4:00 pm at [phone] or toll free [phone].  After clinic hours [phone] to have operator reach doctor.  Bring all of your medication bottles to all your appointments in the pain clinic.  To cancel or reschedule your appointment with Pain Management please remember to call 24 hours in advance to avoid a fee.  Refer to the educational materials which you have been given on: General Risks, I had my Procedure. Discharge Instructions, Post Sedation.  Post Procedure Instructions:  The drugs you were given will stay in your system until tomorrow, so for the next 24 hours you should not drive, make any legal decisions or drink any alcoholic beverages.  You may eat anything you prefer, but it is better to start with liquids then soups and crackers, and gradually work up to solid foods.  Please notify your doctor immediately if you have any unusual bleeding, trouble breathing or pain that is not related to your normal pain.  Depending on the type of procedure that was done, some parts of your body may feel week and/or numb.  This usually clears up by tonight or the next day.  Walk with the use of an assistive device or accompanied by an adult for the 24 hours.  You may use ice on the  affected area for the first 24 hours.  Put ice in a Ziploc bag and cover with a towel and place against area 15 minutes on 15 minutes off.  You may switch to heat after 24 hours.Pain Management Discharge Instructions  General Discharge Instructions :  If you need to reach your doctor call: Monday-Friday 8:00 am - 4:00 pm at [phone] or toll free [phone].  After clinic hours [phone] to have operator reach doctor.  Bring all of your medication bottles to all your appointments in the pain clinic.  To cancel or reschedule your appointment with Pain Management please remember to call 24 hours in advance to avoid a fee.  Refer to the educational materials which you have been given on: General Risks, I had my Procedure. Discharge Instructions, Post Sedation.  Post Procedure Instructions:  The drugs you were given will stay in your system until tomorrow, so for the next 24 hours you should not drive, make any legal decisions or drink any alcoholic beverages.  You may eat anything you prefer, but it is better to start with liquids then soups and crackers, and gradually work up to solid foods.  Please notify your doctor immediately if  you have any unusual bleeding, trouble breathing or pain that is not related to your normal pain.  Depending on the type of procedure that was done, some parts of your body may feel week and/or numb.  This usually clears up by tonight or the next day.  Walk with the use of an assistive device or accompanied by an adult for the 24 hours.  You may use ice on the affected area for the first 24 hours.  Put ice in a Ziploc bag and cover with a towel and place against area 15 minutes on 15 minutes off.  You may switch to heat after 24 hours.

## 2015-08-13 NOTE — Progress Notes (Signed)
Safety precautions to be maintained throughout the outpatient stay will include: orient to surroundings, keep bed in low position, maintain call bell within reach at all times, provide assistance with transfer out of bed and ambulation.  

## 2015-08-13 NOTE — Progress Notes (Signed)
Subjective:    Patient ID: Janice Perkins, female    DOB: Apr 30, 1933, 81 y.o.   MRN: [id number]  HPI  The patient is an 81 year old female who returns to pain management for further evaluation and treatment of pain involving the lower back and lower extremity regions predominantly patient states she has had exacerbation of her pain to severe degree. Patient states that if she stands or walk for appeared to time the pain radiation the lumbar region towards the lower extremity: Severe pain and patient states that she is unable to walk. We discussed performing intraspinal injections such as lumbar epidural steroid injection or selective nerve root block or other procedures which we will for to avoid at this time due to patient's anticoagulation status. Patient previously was taking long-term anticoagulant which was changed to aspirin therapy. At present time we informed patient we will for proceed with peripheral injection to avoid injury reduction of her coagulation status. We will proceed with cluneal and sciatic nerve blocks at time return appointment pending results of the cluneal and sciatic nerve blocks we may request medical clearance for patient to undergo intraspinal procedures such as lumbar epidural steroid injection are of the procedure. The patient was with understanding and agreed to suggested treatment plan. The patient will continue hydrocodone acetaminophen as prescribed this time       Review of Systems     Objective:   Physical Exam  There was tenderness to palpation over the splenius capitis and occipitalis musculature regions of mild degree. Patient appeared to be unremarkable Spurling's maneuver. There was tenderness to palpation over the splenius capitis and occipitalis musculature regions of mild degree. Palpation of the acromioclavicular and glenohumeral joint regions reproduces minimal discomfort. Tinel and Phalen's maneuver were without increased pain and patient appeared  to be with no significant increase of pain performing grip strength which appear to be bilaterally equal. There was tends to palpation over the thoracic facet thoracic paraspinal musculature region a mild/moderate degree with no crepitus of the thoracic region noted. Palpation over the lumbar paraspinal musculature region lumbar facet region was associated with moderate tends to palpation with lateral bending rotation extension and palpation of the lumbar facets reproducing moderate discomfort. There was moderate tenderness to palpation of the PSIS PII S region as well as the gluteal and piriformis musculature regions. Palpation of the gluteal and piriformis musculature region reproduced moderate to moderately severe discomfort. Leg raising was tolerates approximately 20 without increased pain with dorsiflexion noted. EHL strength appeared to be decreased. No definite sensory deficit or dermatomal distribution detected. There was negative clonus negative Homans. Palpation of the greater trochanteric region iliotibial band region was attends to palpation of minimal degree. Abdomen was nontender with no costovertebral tenderness noted..       Assessment & Plan:   Degenerative joint disease of shoulder  Bursitis of shoulder  Degenerative disc disease lumbar spine Moderate involvement L3-4 and L4-L5 with broad-based disc bulge and facet arthropathy present at L2-3, L3-4, L4-5, and L5-S1  Lumbar facet syndrome   Gluteal and piriformis neuralgia  Sacroiliac joint dysfunction     PLAN   Continue present medication hydrocodone acetaminophen  Cluneal and sciatic nerve blocks to be performed at time return appointment  F/U PCP Dr.Hande  for evaliation of  BP and general medical  condition  F/U surgical evaluation. May consider pending follow-up evaluations  F/U neurological evaluation. May consider pending follow-up evaluations  May consider radiofrequency rhizolysis or intraspinal  procedures pending response to  present treatment and F/U evaluation   [facility] to [facility] should patient have concerns prior to scheduled return appointment. Piriformis syndrome

## 2015-08-18 ENCOUNTER — Emergency Department
Admission: EM | Admit: 2015-08-18 | Discharge: 2015-08-18 | Disposition: A | Discharge status: Home / Self Care | Payer: Medicare Other | Attending: Emergency Medicine | Admitting: Emergency Medicine

## 2015-08-18 ENCOUNTER — Emergency Department: Payer: Medicare Other

## 2015-08-18 DIAGNOSIS — I10 Essential (primary) hypertension: Secondary | ICD-10-CM | POA: Diagnosis not present

## 2015-08-18 DIAGNOSIS — J441 Chronic obstructive pulmonary disease with (acute) exacerbation: Secondary | ICD-10-CM | POA: Diagnosis not present

## 2015-08-18 DIAGNOSIS — J0110 Acute frontal sinusitis, unspecified: Secondary | ICD-10-CM | POA: Insufficient documentation

## 2015-08-18 DIAGNOSIS — Z7982 Long term (current) use of aspirin: Secondary | ICD-10-CM | POA: Insufficient documentation

## 2015-08-18 DIAGNOSIS — Z7952 Long term (current) use of systemic steroids: Secondary | ICD-10-CM | POA: Diagnosis not present

## 2015-08-18 DIAGNOSIS — J069 Acute upper respiratory infection, unspecified: Secondary | ICD-10-CM

## 2015-08-18 DIAGNOSIS — Z880 Allergy status to penicillin: Secondary | ICD-10-CM | POA: Insufficient documentation

## 2015-08-18 DIAGNOSIS — R091 Pleurisy: Secondary | ICD-10-CM

## 2015-08-18 DIAGNOSIS — Z79899 Other long term (current) drug therapy: Secondary | ICD-10-CM | POA: Diagnosis not present

## 2015-08-18 DIAGNOSIS — R05 Cough: Secondary | ICD-10-CM | POA: Diagnosis present

## 2015-08-18 LAB — CBC
HCT: 39 % (ref 35.0–47.0)
Hemoglobin: 13.3 g/dL (ref 12.0–16.0)
MCH: 29.7 pg (ref 26.0–34.0)
MCHC: 34.1 g/dL (ref 32.0–36.0)
MCV: 87.3 fL (ref 80.0–100.0)
PLATELETS: 162 10*3/uL (ref 150–440)
RBC: 4.46 MIL/uL (ref 3.80–5.20)
RDW: 14.1 % (ref 11.5–14.5)
WBC: 5.2 10*3/uL (ref 3.6–11.0)

## 2015-08-18 LAB — COMPREHENSIVE METABOLIC PANEL
ALK PHOS: 49 U/L (ref 38–126)
ALT: 20 U/L (ref 14–54)
ANION GAP: 10 (ref 5–15)
AST: 27 U/L (ref 15–41)
Albumin: 3.7 g/dL (ref 3.5–5.0)
BUN: 11 mg/dL (ref 6–20)
CALCIUM: 9.2 mg/dL (ref 8.9–10.3)
CHLORIDE: 99 mmol/L — AB (ref 101–111)
CO2: 27 mmol/L (ref 22–32)
Creatinine, Ser: 1 mg/dL (ref 0.44–1.00)
GFR calc non Af Amer: 52 mL/min — ABNORMAL LOW (ref >60)
Glucose, Bld: 122 mg/dL — ABNORMAL HIGH (ref 65–99)
POTASSIUM: 3.6 mmol/L (ref 3.5–5.1)
SODIUM: 136 mmol/L (ref 135–145)
Total Bilirubin: 1.1 mg/dL (ref 0.3–1.2)
Total Protein: 7.1 g/dL (ref 6.5–8.1)

## 2015-08-18 LAB — TROPONIN I

## 2015-08-18 MED ORDER — HYDROCODONE-ACETAMINOPHEN 5-325 MG PO TABS
1.0000 | ORAL_TABLET | ORAL | Status: AC
  Administered 2015-08-18: 1 via ORAL
  Filled 2015-08-18: qty 1

## 2015-08-18 MED ORDER — PREDNISONE 20 MG PO TABS: 40.0000 mg | ORAL_TABLET | Freq: Every day | ORAL | Status: DC

## 2015-08-18 MED ORDER — PREDNISONE 20 MG PO TABS
40.0000 mg | ORAL_TABLET | Freq: Once | ORAL | Status: AC
  Administered 2015-08-18: 40 mg via ORAL
  Filled 2015-08-18: qty 2

## 2015-08-18 NOTE — ED Notes (Signed)
Pt to room

## 2015-08-18 NOTE — ED Notes (Signed)
Pt here with c/o cough and shortness of breath; pain to left lower ribcage with cough; has not check temp at home; pt talking in complete coherent sentences

## 2015-08-18 NOTE — ED Notes (Signed)
Pt ambulated with O2 stat of 90-92% and no c/o of SOB.

## 2015-08-18 NOTE — ED Provider Notes (Signed)
-----------------------------------------   8:15 AM on 08/18/2015 -----------------------------------------   Blood pressure 131/73, pulse 83, temperature 99.5 F (37.5 C), temperature source Oral, resp. rate 23, height 5\' 1"  (1.549 m), weight 181 lb (82.101 kg), SpO2 93 %.  Assuming care from Dr. Jacqualine Code.  In short, Janice Perkins is a 82 y.o. female with a chief complaint of Cough and Shortness of Breath .  Refer to the original H&P for additional details.  The current plan of care is to ambulate the patient and study her pulse oximetry which remained stable and was 96% on room air. When I was and rechecking on the patient. Auscultation of her lungs showed no significant findings. She states she feels better except some mild twinge of pain over the right lateral chest wall region. States she feels comfortable going home and has nebulizer therapy, etc. at home. She has a pulmonologist to follow-up with.Daymon Larsen, MD 08/18/15 [phone]

## 2015-08-18 NOTE — ED Provider Notes (Signed)
[facility] Emergency Department Provider Note REMINDER - THIS NOTE IS NOT A FINAL MEDICAL RECORD UNTIL IT IS SIGNED. UNTIL THEN, THE CONTENT BELOW MAY REFLECT INFORMATION FROM A DOCUMENTATION TEMPLATE, NOT THE ACTUAL PATIENT VISIT. ____________________________________________  Time seen: Approximately 6:35 AM  I have reviewed the triage vital signs and the nursing notes.   HISTORY  Chief Complaint Cough and Shortness of Breath    HPI Janice Perkins is a 82 y.o. female presents for the evaluation of ongoing cough and pain that is achy with coughing over the right upper chest. She reports that she called her doctor and was prescribed Levaquin starting yesterday. She's been having 3 days of sinus congestion, as well as a frequent slightly productive of yellow sputum cough.  She has felt that she's had a low-grade temperature over the last 2 days. She denies any chest pressure, radiating pain, and only has pain primarily when coughing. She denies wheezing. She reports that she has been on water pills and that her swelling has improved, and she has been losing weight.  Has a history of atrial fibrillation.  Past Medical History  Diagnosis Date  . Cancer [facility]) 2011    breast  . Hypertension   . COPD (chronic obstructive pulmonary disease) ([place])   . Asthma   . Bronchitis 04/2015    Patient Active Problem List   Diagnosis Date Noted  . DDD (degenerative disc disease), lumbar 01/14/2015  . Lumbosacral facet joint syndrome 01/14/2015  . Sacroiliac joint dysfunction 01/14/2015  . Greater trochanteric bursitis 01/14/2015  . Bilateral occipital neuralgia 01/14/2015    Past Surgical History  Procedure Laterality Date  . Breast excisional biopsy Right     Current Outpatient Rx  Name  Route  Sig  Dispense  Refill  . amLODipine (NORVASC) 5 MG tablet   Oral   Take 2.5 mg by mouth daily.          Marland Kitchen aspirin 81 MG tablet   Oral   Take 81 mg by mouth 2 (two)  times daily.          Marland Kitchen atorvastatin (LIPITOR) 20 MG tablet   Oral   Take 20 mg by mouth daily.         . Cholecalciferol 1000 UNIT/10ML LIQD   Oral   Take 1,000 Int'l Units/day by mouth every morning.         . clorazepate (TRANXENE) 7.5 MG tablet   Oral   Take 7.5 mg by mouth 2 (two) times daily as needed for anxiety.         Marland Kitchen esomeprazole (NEXIUM) 40 MG capsule   Oral   Take 40 mg by mouth daily at 12 noon.         . fexofenadine (ALLEGRA) 180 MG tablet   Oral   Take 180 mg by mouth daily.         . fluticasone (FLONASE) 50 MCG/ACT nasal spray   Each Nare   Place into both nostrils daily.         . Fluticasone-Salmeterol (ADVAIR) 100-50 MCG/DOSE AEPB   Inhalation   Inhale 1 puff into the lungs 2 (two) times daily.         . hydrALAZINE (APRESOLINE) 25 MG tablet   Oral   Take 25 mg by mouth 2 (two) times daily.         Marland Kitchen HYDROcodone-acetaminophen (NORCO/VICODIN) 5-325 MG tablet      Limit 1 tab by mouth 2-3 times  per day if tolerated   80 tablet   0   . ipratropium (ATROVENT) 0.02 % nebulizer solution   Nebulization   Take 0.5 mg by nebulization 4 (four) times daily as needed.          Marland Kitchen levothyroxine (SYNTHROID, LEVOTHROID) 25 MCG tablet   Oral   Take 25 mcg by mouth daily before breakfast.         . meloxicam (MOBIC) 15 MG tablet   Oral   Take 15 mg by mouth as needed for pain.         . metoprolol (LOPRESSOR) 50 MG tablet   Oral   Take 50 mg by mouth 2 (two) times daily.         Marland Kitchen tiZANidine (ZANAFLEX) 2 MG tablet   Oral   Take by mouth 3 (three) times daily. prn for muscle spasm         . letrozole (FEMARA) 2.5 MG tablet   Oral   Take 1 tablet (2.5 mg total) by mouth daily. Patient not taking: Reported on 08/13/2015   90 tablet   0   . predniSONE (DELTASONE) 20 MG tablet   Oral   Take 2 tablets (40 mg total) by mouth daily with breakfast.   10 tablet   0     Allergies Penicillins; Captopril; Enalapril  maleate; Seldane ; Tape; Augmentin; and Biaxin  Family History  Problem Relation Age of Onset  . Hypertension Mother   . Heart disease Mother   . Cancer Mother   . Stroke Father   . Hypertension Father     Social History Social History  Substance Use Topics  . Smoking status: Never Smoker   . Smokeless tobacco: Not on file  . Alcohol Use: No    Review of Systems Constitutional: Low-grade fevers. No chills. Eyes: No visual changes. ENT: No sore throat. She reports sinus congestion and pressure across the sinuses by the nose. Cardiovascular: Denies chest pain suffered occasional achy pain in the right upper chest especially when she coughs. Respiratory: Denies shortness of breath at present, she has had some mild shortness of breath at home which appears to be improved. She also notes that she has some chronic mild shortness of breath. Gastrointestinal: No abdominal pain.  No nausea, no vomiting.  No diarrhea.  No constipation. Genitourinary: Negative for dysuria. Musculoskeletal: Negative for back pain. Skin: Negative for rash. Neurological: Negative for headaches, focal weakness or numbness.  10-point ROS otherwise negative.  ____________________________________________   PHYSICAL EXAM:  VITAL SIGNS: ED Triage Vitals  Enc Vitals Group     BP 08/18/15 0403 159/94 mmHg     Pulse Rate 08/18/15 0403 99     Resp 08/18/15 0403 18     Temp 08/18/15 0403 99.5 F (37.5 C)     Temp Source 08/18/15 0403 Oral     SpO2 08/18/15 0403 94 %     Weight 08/18/15 0403 181 lb (82.101 kg)     Height 08/18/15 0403 5\' 1"  (1.549 m)     Head Cir --      Peak Flow --      Pain Score 08/18/15 0405 10     Pain Loc --      Pain Edu? --      Excl. in [place]? --    Constitutional: Alert and oriented. Well appearing and in no acute distress. Eyes: Conjunctivae are normal. PERRL. EOMI. Head: Atraumatic. Nose: No congestion/rhinnorhea. Mouth/Throat: Mucous membranes are moist.  Oropharynx  non-erythematous. Neck: No stridor.   Cardiovascular: Irregularly irregular. Grossly normal heart sounds.  Good peripheral circulation. No JVD. Respiratory: Normal respiratory effort.  No retractions. Lungs CTAB. She appears to be respiratory comfortably and speaks in full sentences. No distress. Gastrointestinal: Soft and nontender. No distention. No abdominal bruits. No CVA tenderness. Musculoskeletal: No lower extremity tenderness there is minimal edema at the ankles, patient reports this is better at that has been in the past.   Neurologic:  Normal speech and language. No gross focal neurologic deficits are appreciated. Skin:  Skin is warm, dry and intact. No rash noted. Psychiatric: Mood and affect are normal. Speech and behavior are normal.  ____________________________________________   LABS (all labs ordered are listed, but only abnormal results are displayed)  Labs Reviewed  COMPREHENSIVE METABOLIC PANEL - Abnormal; Notable for the following:    Chloride 99 (*)    Glucose, Bld 122 (*)    GFR calc non Af Amer 52 (*)    All other components within normal limits  TROPONIN I  CBC   ____________________________________________  EKG  Reviewed and interpreted by me EKG time 4:15 AM Atrial fibrillation, rate controlled Heart rate 75 QRS 90 QTc 4:30 No evidence of ischemic change T waves normal Reviewed and interpreted as atrial fibrillation. ____________________________________________  RADIOLOGY  DG Chest 2 View (Final result) Result time: 08/18/15 05:19:55   Final result by Rad Results In Interface (08/18/15 05:19:55)   Narrative:   CLINICAL DATA: Acute onset of cough and shortness of breath. Left lower rib pain. Initial encounter.  EXAM: CHEST 2 VIEW  COMPARISON: None.  FINDINGS: The lungs are well-aerated. Vascular congestion is noted. Increased interstitial markings raise concern for mild interstitial edema. There is no evidence of pleural effusion or  pneumothorax.  The heart is borderline normal in size. No acute osseous abnormalities are seen.  IMPRESSION: Vascular congestion noted. Increased interstitial markings raise concern for mild interstitial edema.   Electronically Signed By: Garald Balding M.D. On: 08/18/2015 05:19    ____________________________________________   PROCEDURES  Procedure(s) performed: None  Critical Care performed: No  ____________________________________________   INITIAL IMPRESSION / ASSESSMENT AND PLAN / ED COURSE  Pertinent labs & imaging results that were available during my care of the patient were reviewed by me and considered in my medical decision making (see chart for details).  Patient's chest x-ray does demonstrate mild vascular congestion, however in reviewing her records she is currently on Lasix and she reports that she is actually lost approximately 10 pounds and continues to monitor this closely at home. She has minimal peripheral edema at this time, no JVD, and no focal rales on exam without hypoxia. I suspect this is likely chronic in nature, and do not find symptoms to suggest an acute CHF exacerbation. Her symptoms primarily appear to be infectious, surrounding upper respiratory infection with cough and sinusitis. She is currently on Levaquin for the last 24 hours. She has no wheezing, she demonstrates no increased work of breathing, and does not demonstrate symptoms suggest an acute COPD exacerbation. Her EKG demonstrates atrial fibrillation which is chronic, her troponin is normal. In addition, given her upper respiratory symptoms with productive cough for the last 3 days and reassuring clinical exam, I feel this likely represents upper respiratory infection with mild pleurisy and pain secondary to coughing. No signs or symptoms on exam to suggest acute pulmonary embolism, based on her clinical history we will continue her current therapy including Levaquin. Advised close  follow-up and  return precautions. Patient or husband are quite agreeable with this plan. She does have a prescription for hydrocodone at home and will use this as needed as previously prescribed for pain and cough.  The patient does not have evidence of an acute or obvious COPD exacerbation, but given her previous history cough and present symptoms we did discuss. We will start her on a brief course of prednisone, she has inhalers at home should she require.  Ongoing care and disposition assigned Dr. Marcelene Butte. Follow-up on turning pulse ox. Plan to discharge patient home with prescription for prednisone if she continues to do well. She appears very stable at this time. Long-standing history of COPD, currently feeling improved. ____________________________________________   FINAL CLINICAL IMPRESSION(S) / ED DIAGNOSES  Final diagnoses:  Pleurisy  Acute frontal sinusitis, recurrence not specified  Upper respiratory infection      Delman Kitten, MD 08/18/15 [phone]

## 2015-08-18 NOTE — ED Notes (Signed)
Patient transported to X-ray 

## 2015-08-18 NOTE — Discharge Instructions (Signed)
Upper Respiratory Infection, Adult  Please follow up closely with Dr. Ola Spurr in your primary care doctor. Return to the emergency room if you are pain worsens, you develop high fevers, shortness of breath is worsening, you have chest pain and trouble breathing, or other new concerns arise.  Most upper respiratory infections (URIs) are a viral infection of the air passages leading to the lungs. A URI affects the nose, throat, and upper air passages. The most common type of URI is nasopharyngitis and is typically referred to as "the common cold." URIs run their course and usually go away on their own. Most of the time, a URI does not require medical attention, but sometimes a bacterial infection in the upper airways can follow a viral infection. This is called a secondary infection. Sinus and middle ear infections are common types of secondary upper respiratory infections. Bacterial pneumonia can also complicate a URI. A URI can worsen asthma and chronic obstructive pulmonary disease (COPD). Sometimes, these complications can require emergency medical care and may be life threatening.  CAUSES Almost all URIs are caused by viruses. A virus is a type of germ and can spread from one person to another.  RISKS FACTORS You may be at risk for a URI if:   You smoke.   You have chronic heart or lung disease.  You have a weakened defense (immune) system.   You are very young or very old.   You have nasal allergies or asthma.  You work in crowded or poorly ventilated areas.  You work in health care facilities or schools. SIGNS AND SYMPTOMS  Symptoms typically develop 2-3 days after you come in contact with a cold virus. Most viral URIs last 7-10 days. However, viral URIs from the influenza virus (flu virus) can last 14-18 days and are typically more severe. Symptoms may include:   Runny or stuffy (congested) nose.   Sneezing.   Cough.   Sore throat.   Headache.   Fatigue.    Fever.   Loss of appetite.   Pain in your forehead, behind your eyes, and over your cheekbones (sinus pain).  Muscle aches.  DIAGNOSIS  Your health care provider may diagnose a URI by:  Physical exam.  Tests to check that your symptoms are not due to another condition such as:  Strep throat.  Sinusitis.  Pneumonia.  Asthma. TREATMENT  A URI goes away on its own with time. It cannot be cured with medicines, but medicines may be prescribed or recommended to relieve symptoms. Medicines may help:  Reduce your fever.  Reduce your cough.  Relieve nasal congestion. HOME CARE INSTRUCTIONS   Take medicines only as directed by your health care provider.   Gargle warm saltwater or take cough drops to comfort your throat as directed by your health care provider.  Use a warm mist humidifier or inhale steam from a shower to increase air moisture. This may make it easier to breathe.  Drink enough fluid to keep your urine clear or pale yellow.   Eat soups and other clear broths and maintain good nutrition.   Rest as needed.   Return to work when your temperature has returned to normal or as your health care provider advises. You may need to stay home longer to avoid infecting others. You can also use a face mask and careful hand washing to prevent spread of the virus.  Increase the usage of your inhaler if you have asthma.   Do not use any tobacco products,  including cigarettes, chewing tobacco, or electronic cigarettes. If you need help quitting, ask your health care provider. PREVENTION  The best way to protect yourself from getting a cold is to practice good hygiene.   Avoid oral or hand contact with people with cold symptoms.   Wash your hands often if contact occurs.  There is no clear evidence that vitamin C, vitamin E, echinacea, or exercise reduces the chance of developing a cold. However, it is always recommended to get plenty of rest, exercise, and  practice good nutrition.  SEEK MEDICAL CARE IF:   You are getting worse rather than better.   Your symptoms are not controlled by medicine.   You have chills.  You have worsening shortness of breath.  You have brown or red mucus.  You have yellow or brown nasal discharge.  You have pain in your face, especially when you bend forward.  You have a fever.  You have swollen neck glands.  You have pain while swallowing.  You have white areas in the back of your throat. SEEK IMMEDIATE MEDICAL CARE IF:   You have severe or persistent:  Headache.  Ear pain.  Sinus pain.  Chest pain.  You have chronic lung disease and any of the following:  Wheezing.  Prolonged cough.  Coughing up blood.  A change in your usual mucus.  You have a stiff neck.  You have changes in your:  Vision.  Hearing.  Thinking.  Mood. MAKE SURE YOU:   Understand these instructions.  Will watch your condition.  Will get help right away if you are not doing well or get worse.   This information is not intended to replace advice given to you by your health care provider. Make sure you discuss any questions you have with your health care provider.   Document Released: 01/27/2001 Document Revised: 12/18/2014 Document Reviewed: 11/08/2013 Elsevier Interactive Patient Education [employer].

## 2015-08-18 NOTE — ED Notes (Signed)
Resumed care from [place], [place]

## 2015-08-18 NOTE — ED Notes (Signed)
Pt verbalized understanding of discharge instructions. NAD at this time. 

## 2015-08-28 ENCOUNTER — Ambulatory Visit: Payer: Medicare Other | Admitting: Pain Medicine

## 2015-09-01 ENCOUNTER — Encounter: Payer: Self-pay | Admitting: Radiology

## 2015-09-01 ENCOUNTER — Emergency Department: Payer: Medicare Other

## 2015-09-01 ENCOUNTER — Emergency Department
Admission: EM | Admit: 2015-09-01 | Discharge: 2015-09-01 | Disposition: A | Discharge status: Home / Self Care | Payer: Medicare Other | Attending: Emergency Medicine | Admitting: Emergency Medicine

## 2015-09-01 DIAGNOSIS — Z7982 Long term (current) use of aspirin: Secondary | ICD-10-CM | POA: Diagnosis not present

## 2015-09-01 DIAGNOSIS — M48061 Spinal stenosis, lumbar region without neurogenic claudication: Secondary | ICD-10-CM

## 2015-09-01 DIAGNOSIS — Z7951 Long term (current) use of inhaled steroids: Secondary | ICD-10-CM | POA: Insufficient documentation

## 2015-09-01 DIAGNOSIS — G8929 Other chronic pain: Secondary | ICD-10-CM | POA: Insufficient documentation

## 2015-09-01 DIAGNOSIS — Y9289 Other specified places as the place of occurrence of the external cause: Secondary | ICD-10-CM | POA: Diagnosis not present

## 2015-09-01 DIAGNOSIS — Z79899 Other long term (current) drug therapy: Secondary | ICD-10-CM | POA: Insufficient documentation

## 2015-09-01 DIAGNOSIS — Y9389 Activity, other specified: Secondary | ICD-10-CM | POA: Diagnosis not present

## 2015-09-01 DIAGNOSIS — X58XXXA Exposure to other specified factors, initial encounter: Secondary | ICD-10-CM | POA: Insufficient documentation

## 2015-09-01 DIAGNOSIS — M4806 Spinal stenosis, lumbar region: Secondary | ICD-10-CM | POA: Diagnosis not present

## 2015-09-01 DIAGNOSIS — S32030A Wedge compression fracture of third lumbar vertebra, initial encounter for closed fracture: Secondary | ICD-10-CM

## 2015-09-01 DIAGNOSIS — Y998 Other external cause status: Secondary | ICD-10-CM | POA: Insufficient documentation

## 2015-09-01 DIAGNOSIS — I10 Essential (primary) hypertension: Secondary | ICD-10-CM | POA: Insufficient documentation

## 2015-09-01 DIAGNOSIS — S3992XA Unspecified injury of lower back, initial encounter: Secondary | ICD-10-CM | POA: Diagnosis present

## 2015-09-01 DIAGNOSIS — S32038A Other fracture of third lumbar vertebra, initial encounter for closed fracture: Secondary | ICD-10-CM | POA: Insufficient documentation

## 2015-09-01 MED ORDER — PREDNISONE 20 MG PO TABS
60.0000 mg | ORAL_TABLET | Freq: Once | ORAL | Status: AC
  Administered 2015-09-01: 60 mg via ORAL
  Filled 2015-09-01: qty 3

## 2015-09-01 MED ORDER — PREDNISONE 10 MG PO TABS: 10.0000 mg | ORAL_TABLET | Freq: Every day | ORAL | Status: DC

## 2015-09-01 MED ORDER — OXYCODONE-ACETAMINOPHEN 5-325 MG PO TABS: 1.0000 | ORAL_TABLET | Freq: Four times a day (QID) | ORAL | Status: DC | PRN

## 2015-09-01 MED ORDER — OXYCODONE-ACETAMINOPHEN 5-325 MG PO TABS
1.0000 | ORAL_TABLET | Freq: Once | ORAL | Status: AC
  Administered 2015-09-01: 1 via ORAL
  Filled 2015-09-01: qty 1

## 2015-09-01 NOTE — ED Notes (Signed)
Pt reports lower back pain since Tuesday. History of chronic back pain. Receives cortisone injections from Dr. Primus Bravo. States she had a muscle relaxer last night that only helped for about 5 mins.

## 2015-09-01 NOTE — ED Notes (Signed)
Patient transported to CT 

## 2015-09-01 NOTE — ED Provider Notes (Signed)
CSN: [id number]     Arrival date & time 09/01/15  [id number] History   First MD Initiated Contact with Patient 09/01/15 1009     Chief Complaint  Patient presents with  . Back Pain     (Consider location/radiation/quality/duration/timing/severity/associated sxs/prior Treatment) HPI  82 year old female presents to the emergency department for evaluation of acute on chronic lower back pain. Patient is a chronic pain patient of Dr. crisp, has appointment scheduled for tomorrow morning. Patient was told by her pain physician to come to the emergency department today for possible scanning evaluation of back pain and left-sided pain symptoms. Patient is status post lumbar spine surgery 6 years ago. Patient had a fall back in September 2016, fell out of the bed, injured her back. Initial x-rays show no fracture. She receives occasional epidural steroid injections. Patient states 6 days ago she had increasing pain in the left lower back radiating down her left trochanteric bursa into the left anterior lateral thigh to the knee. She denies any weakness, loss of bowel or bladder symptoms. Her pain is increased with standing and activity. She gets some relief with lying down. Pain is currently 10 out of 10. Pain is described as burning. No relief with Norco 5-325 one tab by mouth 3 times a day when necessary pain.  Past Medical History  Diagnosis Date  . Cancer [facility]) 2011    breast  . Hypertension   . COPD (chronic obstructive pulmonary disease) ([place])   . Asthma   . Bronchitis 04/2015   Past Surgical History  Procedure Laterality Date  . Breast excisional biopsy Right    Family History  Problem Relation Age of Onset  . Hypertension Mother   . Heart disease Mother   . Cancer Mother   . Stroke Father   . Hypertension Father    Social History  Substance Use Topics  . Smoking status: Never Smoker   . Smokeless tobacco: None  . Alcohol Use: No   OB History    No data available     Review of  Systems  Constitutional: Negative for fever, chills, activity change and fatigue.  HENT: Negative for congestion, sinus pressure and sore throat.   Eyes: Negative for visual disturbance.  Respiratory: Negative for cough, chest tightness and shortness of breath.   Cardiovascular: Negative for chest pain and leg swelling.  Gastrointestinal: Negative for nausea, vomiting, abdominal pain and diarrhea.  Genitourinary: Negative for dysuria, urgency, frequency, hematuria, flank pain, decreased urine volume and pelvic pain.  Musculoskeletal: Positive for back pain. Negative for joint swelling, arthralgias, gait problem and neck pain.  Skin: Negative for rash.  Neurological: Negative for weakness, numbness and headaches.  Hematological: Negative for adenopathy.  Psychiatric/Behavioral: Negative for behavioral problems, confusion and agitation.      Allergies  Penicillins; Captopril; Enalapril maleate; Seldane ; Tape; Augmentin; and Biaxin  Home Medications   Prior to Admission medications   Medication Sig Start Date End Date Taking? Authorizing Provider  amLODipine (NORVASC) 5 MG tablet Take 2.5 mg by mouth daily.     Historical Provider, MD  aspirin 81 MG tablet Take 81 mg by mouth 2 (two) times daily.     Historical Provider, MD  atorvastatin (LIPITOR) 20 MG tablet Take 20 mg by mouth daily.    Historical Provider, MD  Cholecalciferol 1000 UNIT/10ML LIQD Take 1,000 Int'l Units/day by mouth every morning.    Historical Provider, MD  clorazepate (TRANXENE) 7.5 MG tablet Take 7.5 mg by mouth 2 (two) times  daily as needed for anxiety.    Historical Provider, MD  esomeprazole (NEXIUM) 40 MG capsule Take 40 mg by mouth daily at 12 noon.    Historical Provider, MD  fexofenadine (ALLEGRA) 180 MG tablet Take 180 mg by mouth daily.    Historical Provider, MD  fluticasone (FLONASE) 50 MCG/ACT nasal spray Place into both nostrils daily.    Historical Provider, MD  Fluticasone-Salmeterol (ADVAIR) 100-50  MCG/DOSE AEPB Inhale 1 puff into the lungs 2 (two) times daily.    Historical Provider, MD  hydrALAZINE (APRESOLINE) 25 MG tablet Take 25 mg by mouth 2 (two) times daily.    Historical Provider, MD  HYDROcodone-acetaminophen (NORCO/VICODIN) 5-325 MG tablet Limit 1 tab by mouth 2-3 times per day if tolerated 08/13/15   Mohammed Kindle, MD  ipratropium (ATROVENT) 0.02 % nebulizer solution Take 0.5 mg by nebulization 4 (four) times daily as needed.     Historical Provider, MD  letrozole (FEMARA) 2.5 MG tablet Take 1 tablet (2.5 mg total) by mouth daily. Patient not taking: Reported on 08/13/2015 12/28/14   Dallas Schimke, MD  levothyroxine (SYNTHROID, LEVOTHROID) 25 MCG tablet Take 25 mcg by mouth daily before breakfast.    Historical Provider, MD  meloxicam (MOBIC) 15 MG tablet Take 15 mg by mouth as needed for pain.    Historical Provider, MD  metoprolol (LOPRESSOR) 50 MG tablet Take 50 mg by mouth 2 (two) times daily.    Historical Provider, MD  oxyCODONE-acetaminophen (ROXICET) 5-325 MG tablet Take 1-2 tablets by mouth every 6 (six) hours as needed for severe pain. 09/01/15   Duanne Guess, PA-C  predniSONE (DELTASONE) 10 MG tablet Take 1 tablet (10 mg total) by mouth daily. 6,5,4,3,2,1 six day taper 09/01/15   Duanne Guess, PA-C  predniSONE (DELTASONE) 20 MG tablet Take 2 tablets (40 mg total) by mouth daily with breakfast. 08/18/15   Delman Kitten, MD  tiZANidine (ZANAFLEX) 2 MG tablet Take by mouth 3 (three) times daily. prn for muscle spasm    Historical Provider, MD   BP 151/77 mmHg  Pulse 74  Temp(Src) 98.3 F (36.8 C) (Oral)  Resp 18  Ht 5\' 1"  (1.549 m)  Wt 80.74 kg  BMI 33.65 kg/m2  SpO2 98% Physical Exam  Constitutional: She is oriented to person, place, and time. She appears well-developed and well-nourished. No distress.  HENT:  Head: Normocephalic and atraumatic.  Mouth/Throat: Oropharynx is clear and moist.  Eyes: EOM are normal. Pupils are equal, round, and reactive to light.  Right eye exhibits no discharge. Left eye exhibits no discharge.  Neck: Normal range of motion. Neck supple.  Cardiovascular: Normal rate, regular rhythm and intact distal pulses.   Pulmonary/Chest: Effort normal and breath sounds normal. No respiratory distress. She exhibits no tenderness.  Abdominal: Soft. She exhibits no distension and no mass. There is no tenderness. There is no rebound and no guarding.  Musculoskeletal:  Lumbar Spine: Examinati limited range of motion lumbar spine with flexion and extension. Examination on of the lumbar spine reveals no bony abnormality, no edema, and no ecchymosis.  There is no step off.  The patient has full range of motion of the lumbar spine with flexion and extension.  The patient has lateral bend and rotation.  The patient has mild pain with range of motion activities.  The patient has a negative axial load test, and a negative rotational Waddell test.  The patient is non tender along the spinous process.  The patient is non tender  along the paravertebral muscles, with no muscle spasms.  The patient is non tender along the iliac crest.  The patient is non tender in the sciatic notch.  The patient is non tender along the Sacroiliac joint.  There is no Coccyx joint tenderness.    Bilateral Lower Extremities: Examination of the lower extremities reveals no bony abnormality, no edema, and no ecchymosis.  The patient has full active and passive range of motion of the hips, knees, and ankles.  There is no discomfort with range of motion exercises.  The patient is tender along the greater trochanter region of the left hip.  The patient has a negative Bevelyn Buckles' test bilaterally.  There is normal skin warmth.  There is normal capillary refill bilaterally.    Neurologic: The patient has a negative straight leg raise.  The patient has normal muscle strength testing for the quadriceps, calves, ankle dorsiflexion, ankle plantarflexion, and extensor hallicus longus.  The  patient has sensation that is intact to light touch.  The deep tendon reflexes are nor  Neurological: She is alert and oriented to person, place, and time. She has normal reflexes.  Skin: Skin is warm and dry.  Psychiatric: She has a normal mood and affect. Her behavior is normal. Thought content normal.  Nursing note and vitals reviewed.   ED Course  Procedures (including critical care time) Labs Review Labs Reviewed - No data to display  Imaging Review Ct Lumbar Spine Wo Contrast  09/01/2015  CLINICAL DATA:  Low back pain for several days. No reported history of trauma. History of breast cancer. History of osteopenia. EXAM: CT LUMBAR SPINE WITHOUT CONTRAST TECHNIQUE: Multidetector CT imaging of the lumbar spine was performed without intravenous contrast administration. Multiplanar CT image reconstructions were also generated. COMPARISON:  Lumbar spine radiographs 01/05/2014. FINDINGS: Normal segmentation. 4 mm anterolisthesis L4-5 appears postsurgical/facet mediated. Previous lumbar decompression at this level. Posterior arthrodesis appears incomplete at L4-5, but probably solid at L5-S1. Trace retrolisthesis L1-2. Coarsened trabecular markings consistent with osteopenia, but no worrisome osseous lesions. At L3, there is anterior wedging, with displacement of an osseous fragment from the superior anterior and leftward corner of the vertebral body. See image 29 series 5, image 45 series 4. This is consistent with an acute compression fracture although no history is given of such. Correlate clinically for recent fall. No paravertebral hematoma. No retropulsion. Moderate stenosis of a multifactorial nature is suspected at L2-3 and L3-4 related to disc disease and posterior element hypertrophy. This may be worse on the LEFT at L3-L4. Adequate posterior decompression at L4-5. BILATERAL foraminal narrowing at L5-S1. Aortic atherosclerosis without aneurysmal dilatation. No renal abnormalities. IMPRESSION:  Suspected acute compression fracture at L3, superiorly on the LEFT. No retropulsion. Moderate multifactorial spinal stenosis is suspected at L2-3 and L3-4. Previous lumbar surgery at L4-5 with adequate posterior decompression. Incomplete arthrodesis with 4 mm anterolisthesis. Probable solid arthrodesis at L5-S1 with BILATERAL foraminal narrowing. Electronically Signed   By: Staci Righter M.D.   On: 09/01/2015 11:31   I have personally reviewed and evaluated these images and lab results as part of my medical decision-making.   EKG Interpretation None      MDM   Final diagnoses:  Compression fracture of L3 lumbar vertebra, closed, initial encounter ([place])  Spinal stenosis of lumbar region   82 year old female with acute on chronic lower back pain with burning pain going down her left leg and L5 distribution. She has moderate tenderness over the left greater trochanteric region. Spine was  nontender to percussion. No neurological deficits on exam. CT scan shows spinal stenosis with possible acute L3 compression fracture. I do not believe that L3 compression fractures causing her much discomfort. Most of her pain is coming from likely lumbar stenosis and possible left hip trochanteric bursitis. Patient has an appointment with Dr. Primus Bravo tomorrow. We'll treat with oxycodone 5 mg, one tab by mouth every 4-6 hours when necessary pain. She will discontinue Norco. She is given prednisone 6 day taper. Return to the ER for any worsening symptoms urgent changes in her health.     Duanne Guess, PA-C 09/01/15 1152  Lisa Roca, MD 09/01/15 [phone]

## 2015-09-01 NOTE — Discharge Instructions (Signed)
Lumbar Fracture A lumbar fracture is a break in one of the bones of the lower back. Lumbar fractures range in severity. Severe fractures can damage the spinal cord. CAUSES This condition may be caused by:  A fall (common).  A car accident (common).  A gunshot wound.  A hard, direct hit to the back.  Osteoporosis. SYMPTOMS The main symptom of this condition is severe pain in the lower back. If a fracture is complex or severe, there may also be:  A misshapen or swollen area on the lower back.  A limited ability to move an area of the lower back.  An inability to empty the bladder or bowel.  A loss of strength or sensation in the legs, feet, and toes.  Paralysis. DIAGNOSIS This condition is diagnosed based on:  A physical exam.  Symptoms and what happened just before they developed.  The results of imaging tests, such as an X-ray, CT scan, or MRI. If your nerves have been damaged, you may also have other tests to find out how much damage there is. TREATMENT Treatment for this condition depends on the specifics of the injury. Most fractures can be treated with:  A back brace.  Bed rest and activity restrictions.  Pain medicine.  Physical therapy. Fractures that are complex, involve multiple bones, or make the spine unstable may require surgery to remove pressure from the nerves or spinal cord and to stabilize the broken pieces of bone. During recovery, it is normal to have pain and stiffness in the back for weeks. HOME CARE INSTRUCTIONS Medicines  Take medicines only as directed by your health care provider.  Do not drive or operate heavy machinery while taking pain medicine. Activity  Stay in bed for as long as directed by your health care provider.  If you were shown how to do any exercises to improve motion and strength in your back, do them as directed by your health care provider.  Return to your normal activities as directed by your health care provider.  Ask your health care provider what activities are safe for you. General Instructions  If you were given a neck brace or back brace, wear it as directed by your health care provider.  Keep all follow-up visits as directed by your health care provider. This is important. Failure to follow-up as recommended could result in permanent injury, disability, and long-lasting (chronic) pain. SEEK MEDICAL CARE IF:  Your pain does not improve over time.  You have a persistent cough.  You cannot return to your normal activities as planned or expected. SEEK IMMEDIATE MEDICAL CARE IF:  You have severe pain or your pain suddenly gets worse.  You are unable to move.  You have numbness, tingling, weakness, or paralysis in any part of your body.  You cannot control your bladder or bowel.  You have difficulty breathing.  You have a fever.  You have pain in your chest or abdomen.  You vomit.   This information is not intended to replace advice given to you by your health care provider. Make sure you discuss any questions you have with your health care provider.   Document Released: 11/18/2006 Document Revised: 12/18/2014 Document Reviewed: 07/30/2014 Elsevier Interactive Patient Education 2016 [place].  Spinal Stenosis Spinal stenosis is when the open spaces between the bones of your spine (vertebrae) get smaller (narrow). It is caused by bone pushing on the open spaces of your spine. This puts pressure on your spine and the nerves in your  spine. You may be given medicine to lessen the puffiness (inflammation) in your nerves. Other times, surgery is needed. HOME CARE  Change positions when you sit, stand, and lie. This can help take pressure off your nerves.  Do exercises as told by your doctor to strengthen the middle part of your body.  Lose weight if your doctor suggests it. This takes pressure off your spine.  Take all medicine as told by your doctor. MAKE SURE YOU:  Understand  these instructions.  Will watch your condition.  Will get help right away if you are not doing well or get worse.   This information is not intended to replace advice given to you by your health care provider. Make sure you discuss any questions you have with your health care provider.   Document Released: 11/27/2010 Document Revised: 04/05/2013 Document Reviewed: 11/11/2012 Elsevier Interactive Patient Education [employer].

## 2015-09-01 NOTE — ED Notes (Addendum)
Pt has appt with Dr. Primus Bravo on Wednesday, but pain is increased since Monday or Tuesday. Pt states she takes pain medication hydrocodone 5/325 up to 3/day but has been taking 1-2 per day.  Pt states she talked to Dr. Primus Bravo today and was told to come to ED for a scan. Pt states she is going to see him tomorrow at 7am.

## 2015-09-02 ENCOUNTER — Encounter: Payer: Self-pay | Admitting: Pain Medicine

## 2015-09-02 ENCOUNTER — Ambulatory Visit: Payer: Medicare Other | Attending: Pain Medicine | Admitting: Pain Medicine

## 2015-09-02 VITALS — BP 153/84 | HR 70 | Temp 97.8°F | Resp 16 | Ht 61.0 in | Wt 180.0 lb

## 2015-09-02 DIAGNOSIS — M7060 Trochanteric bursitis, unspecified hip: Secondary | ICD-10-CM

## 2015-09-02 DIAGNOSIS — Z9889 Other specified postprocedural states: Secondary | ICD-10-CM | POA: Diagnosis not present

## 2015-09-02 DIAGNOSIS — M79606 Pain in leg, unspecified: Secondary | ICD-10-CM | POA: Diagnosis present

## 2015-09-02 DIAGNOSIS — S32000A Wedge compression fracture of unspecified lumbar vertebra, initial encounter for closed fracture: Secondary | ICD-10-CM

## 2015-09-02 DIAGNOSIS — M4316 Spondylolisthesis, lumbar region: Secondary | ICD-10-CM | POA: Insufficient documentation

## 2015-09-02 DIAGNOSIS — M5481 Occipital neuralgia: Secondary | ICD-10-CM

## 2015-09-02 DIAGNOSIS — M545 Low back pain: Secondary | ICD-10-CM | POA: Diagnosis not present

## 2015-09-02 DIAGNOSIS — M5136 Other intervertebral disc degeneration, lumbar region: Secondary | ICD-10-CM

## 2015-09-02 DIAGNOSIS — M7551 Bursitis of right shoulder: Secondary | ICD-10-CM

## 2015-09-02 DIAGNOSIS — M47817 Spondylosis without myelopathy or radiculopathy, lumbosacral region: Secondary | ICD-10-CM

## 2015-09-02 DIAGNOSIS — Z7982 Long term (current) use of aspirin: Secondary | ICD-10-CM

## 2015-09-02 DIAGNOSIS — M51369 Other intervertebral disc degeneration, lumbar region without mention of lumbar back pain or lower extremity pain: Secondary | ICD-10-CM

## 2015-09-02 DIAGNOSIS — M19011 Primary osteoarthritis, right shoulder: Secondary | ICD-10-CM

## 2015-09-02 DIAGNOSIS — M533 Sacrococcygeal disorders, not elsewhere classified: Secondary | ICD-10-CM

## 2015-09-02 LAB — PROTIME-INR
INR: 1.06
Prothrombin Time: 14 seconds (ref 11.4–15.0)

## 2015-09-02 LAB — APTT: aPTT: 30 seconds (ref 24–36)

## 2015-09-02 MED ORDER — TRIAMCINOLONE ACETONIDE 40 MG/ML IJ SUSP
INTRAMUSCULAR | Status: AC
  Administered 2015-09-02: 11:00:00
  Filled 2015-09-02: qty 1

## 2015-09-02 MED ORDER — BUPIVACAINE HCL (PF) 0.25 % IJ SOLN: 30.0000 mL | Freq: Once | INTRAMUSCULAR | Status: DC

## 2015-09-02 MED ORDER — CIPROFLOXACIN IN D5W 400 MG/200ML IV SOLN: 400.0000 mg | Freq: Once | INTRAVENOUS | Status: DC

## 2015-09-02 MED ORDER — CIPROFLOXACIN IN D5W 400 MG/200ML IV SOLN
INTRAVENOUS | Status: AC
  Administered 2015-09-02: 400 mg via INTRAVENOUS
  Filled 2015-09-02: qty 200

## 2015-09-02 MED ORDER — LACTATED RINGERS IV SOLN: 1000.0000 mL | INTRAVENOUS | Status: DC

## 2015-09-02 MED ORDER — ORPHENADRINE CITRATE 30 MG/ML IJ SOLN: 60.0000 mg | Freq: Once | INTRAMUSCULAR | Status: DC

## 2015-09-02 MED ORDER — ORPHENADRINE CITRATE 30 MG/ML IJ SOLN
INTRAMUSCULAR | Status: AC
  Administered 2015-09-02: 11:00:00
  Filled 2015-09-02: qty 2

## 2015-09-02 MED ORDER — FENTANYL CITRATE (PF) 100 MCG/2ML IJ SOLN: 100.0000 ug | Freq: Once | INTRAMUSCULAR | Status: DC

## 2015-09-02 MED ORDER — MIDAZOLAM HCL 5 MG/5ML IJ SOLN
INTRAMUSCULAR | Status: AC
  Administered 2015-09-02: 2 mg via INTRAVENOUS
  Filled 2015-09-02: qty 5

## 2015-09-02 MED ORDER — TRIAMCINOLONE ACETONIDE 40 MG/ML IJ SUSP: 40.0000 mg | Freq: Once | INTRAMUSCULAR | Status: DC

## 2015-09-02 MED ORDER — CIPROFLOXACIN HCL 250 MG PO TABS: 250.0000 mg | ORAL_TABLET | Freq: Two times a day (BID) | ORAL | Status: DC

## 2015-09-02 MED ORDER — SODIUM CHLORIDE 0.9 % IJ SOLN: 20.0000 mL | Freq: Once | INTRAMUSCULAR | Status: DC

## 2015-09-02 MED ORDER — FENTANYL CITRATE (PF) 100 MCG/2ML IJ SOLN
INTRAMUSCULAR | Status: AC
  Administered 2015-09-02: 100 ug
  Filled 2015-09-02: qty 2

## 2015-09-02 MED ORDER — BUPIVACAINE HCL (PF) 0.25 % IJ SOLN
INTRAMUSCULAR | Status: AC
  Administered 2015-09-02: 11:00:00
  Filled 2015-09-02: qty 30

## 2015-09-02 MED ORDER — MIDAZOLAM HCL 5 MG/5ML IJ SOLN: 5.0000 mg | Freq: Once | INTRAMUSCULAR | Status: DC

## 2015-09-02 NOTE — Progress Notes (Signed)
Subjective:    Patient ID: Janice Perkins, female    DOB: Sep 09, 1932, 82 y.o.   MRN: [id number]  HPI  PROCEDURE PERFORMED: Lumbar facet (medial branch block)   NOTE: The patient is a 82 y.o. female who returns to [place] for further evaluation and treatment of pain involving the lumbar and lower extremity region. CT scan  revealed the patient to be with evidence of  Suspected acute compression fracture at L3, superiorly on the LEFT. No retropulsion.  Moderate multifactorial spinal stenosis is suspected at L2-3 and L3-4.  Previous lumbar surgery at L4-5 with adequate posterior decompression. Incomplete arthrodesis with 4 mm anterolisthesis.  Probable solid arthrodesis at L5-S1 with BILATERAL foraminal narrowing.. We discussed patient's condition with the patient and obtained PT PTT and INR prior to procedure. which were within normal limits. We discussed performing lumbar facet, medial branch nerve, blocks as well as referring patient to Dr. Rudene Christians to consider kyphoplasty. The patient wished to proceed with procedure today due to severely disabling pain and will follow-up with Dr. Rudene Christians for further evaluation as discussed   The risks, benefits, and expectations of the procedure have been discussed and explained to the patient who was understanding and in agreement with suggested treatment plan. We will proceed with interventional treatment as discussed and as explained to the patient who was understanding and wished to proceed with procedure as planned.   DESCRIPTION OF PROCEDURE: Lumbar facet (medial branch block) with IV Versed, IV fentanyl conscious sedation, EKG, blood pressure, pulse, and pulse oximetry monitoring. The procedure was performed with the patient in the prone position. Betadine prep of proposed entry site performed.   NEEDLE PLACEMENT AT: Left L facet (medial branch block). Under fluoroscopic guidance with oblique orientation of 15 degrees, a 22-gauge needle  was inserted at the L  body level with needle placed at the targeted area of Burton's Eye or Eye of the Scotty Dog with documentation of needle placement in the superior and lateral border of targeted area of Burton's Eye or Eye of the Scotty Dog with oblique orientation of 15 degrees. Following documentation of needle placement at the L 2 vertebral body level, needle placement was then accomplished at the L 3 vertebral body level.   NEEDLE PLACEMENT AT L3, L4, and L5 VERTEBRAL BODY LEVELS ON THE LEFT SIDE The procedure was performed at the L3, L4, and L5 vertebral body levels exactly as was performed at the L 2 vertebral body level utilizing the same technique and under fluoroscopic guidance.  NEEDLE PLACEMENT AT THE SACRAL ALA with AP view of the lumbosacral spine. With the patient in the prone position, Betadine prep of proposed entry site accomplished, a 22 gauge needle was inserted in the region of the sacral ala (groove formed by the superior articulating process of S1 and the sacral wing). Following documentation of needle placement at the sacral ala,     Needle placement was then verified at all levels on lateral view. Following documentation of needle placement at all levels on lateral view and following negative aspiration for heme and CSF, each level was injected with 1 mL of 0.25% bupivacaine with Kenalog.     LUMBAR FACET, MEDIAL BRANCH NERVE, BLOCKS PERFORMED ON THE RIGHT SIDE   The procedure was performed on the right side exactly as was performed on the left side at the same levels and utilizing the same technique under fluoroscopic guidance.     The patient tolerated the procedure well. A total of  40 mg of Kenalog was utilized for the procedure.   PLAN:  1. Medications: The patient will continue presently prescribed medications 2. May consider modification of treatment regimen at time of return appointment pending response to treatment rendered on today's visit. 3. The  patient is to follow-up with primary care physician Dr. Ginette Pitman  for further evaluation of blood pressure and general medical condition status post steroid injection performed on today's visit. 4. Surgical follow-up evaluation. Follow-up with Dr. Rudene Christians consider kyphoplasty or vertebroplasty  5. Neurological follow-up evaluation. Has been addressed  6. The patient may be candidate for radiofrequency procedures, implantation type procedures, and other treatment pending response to treatment and follow-up evaluation. 7. The patient has been advised to call the [facility] prior to scheduled return appointment should there be significant change in condition or should patient have other concerns regarding condition prior to scheduled return appointment.  The patient is understanding and in agreement with suggested treatment plan.   Review of Systems     Objective:   Physical Exam        Assessment & Plan:

## 2015-09-02 NOTE — Patient Instructions (Addendum)
PLAN   Continue present medication hydrocodone acetaminophen Please get Cipro antibiotic and begin taking today DO NOT TAKE ANY ASPIRIN today or tomorrow Tuesday, 09/03/2015  F/U PCP Dr.Hande  for evaliation of  BP and general medical  condition  F/U surgical evaluation. May consider pending follow-up evaluations  Ask receptionist the date of your appointment with Dr.Menz . I would like for you to see Dr.Menz this week   F/U neurological evaluation. May consider pending follow-up evaluations  May consider radiofrequency rhizolysis or intraspinal procedures pending response to present treatment and F/U evaluation   Patient to call [facility] should patient have concerns prior to scheduled return appointment.Pain Management Discharge Instructions  General Discharge Instructions :  If you need to reach your doctor call: Monday-Friday 8:00 am - 4:00 pm at [phone] or toll free [phone].  After clinic hours [phone] to have operator reach doctor.  Bring all of your medication bottles to all your appointments in the pain clinic.  To cancel or reschedule your appointment with Pain Management please remember to call 24 hours in advance to avoid a fee.  Refer to the educational materials which you have been given on: General Risks, I had my Procedure. Discharge Instructions, Post Sedation.  Post Procedure Instructions:  The drugs you were given will stay in your system until tomorrow, so for the next 24 hours you should not drive, make any legal decisions or drink any alcoholic beverages.  You may eat anything you prefer, but it is better to start with liquids then soups and crackers, and gradually work up to solid foods.  Please notify your doctor immediately if you have any unusual bleeding, trouble breathing or pain that is not related to your normal pain.  Depending on the type of procedure that was done, some parts of your body may feel week and/or numb.  This  usually clears up by tonight or the next day.  Walk with the use of an assistive device or accompanied by an adult for the 24 hours.  You may use ice on the affected area for the first 24 hours.  Put ice in a Ziploc bag and cover with a towel and place against area 15 minutes on 15 minutes off.  You may switch to heat after 24 hours.Facet Joint Block The facet joints connect the bones of the spine (vertebrae). They make it possible for you to bend, twist, and make other movements with your spine. They also prevent you from overbending, overtwisting, and making other excessive movements.  A facet joint block is a procedure where a numbing medicine (anesthetic) is injected into a facet joint. Often, a type of anti-inflammatory medicine called a steroid is also injected. A facet joint block may be done for two reasons:   Diagnosis. A facet joint block may be done as a test to see whether neck or back pain is caused by a worn-down or infected facet joint. If the pain gets better after a facet joint block, it means the pain is probably coming from the facet joint. If the pain does not get better, it means the pain is probably not coming from the facet joint.   Therapy. A facet joint block may be done to relieve neck or back pain caused by a facet joint. A facet joint block is only done as a therapy if the pain does not improve with medicine, exercise programs, physical therapy, and other forms of pain management. LET [facility] CARE PROVIDER KNOW ABOUT:  Any allergies you have.   All medicines you are taking, including vitamins, herbs, eyedrops, and over-the-counter medicines and creams.   Previous problems you or members of your family have had with the use of anesthetics.   Any blood disorders you have had.   Other health problems you have. RISKS AND COMPLICATIONS Generally, having a facet joint block is safe. However, as with any procedure, complications can occur. Possible complications  associated with having a facet joint block include:   Bleeding.   Injury to a nerve near the injection site.   Pain at the injection site.   Weakness or numbness in areas controlled by nerves near the injection site.   Infection.   Temporary fluid retention.   Allergic reaction to anesthetics or medicines used during the procedure. BEFORE THE PROCEDURE   Follow your health care provider's instructions if you are taking dietary supplements or medicines. You may need to stop taking them or reduce your dosage.   Do not take any new dietary supplements or medicines without asking your health care provider first.   Follow your health care provider's instructions about eating and drinking before the procedure. You may need to stop eating and drinking several hours before the procedure.   Arrange to have an adult drive you home after the procedure. PROCEDURE  You may need to remove your clothing and dress in an open-back gown so that your health care provider can access your spine.   The procedure will be done while you are lying on an X-ray table. Most of the time you will be asked to lie on your stomach, but you may be asked to lie in a different position if an injection will be made in your neck.   Special machines will be used to monitor your oxygen levels, heart rate, and blood pressure.   If an injection will be made in your neck, an intravenous (IV) tube will be inserted into one of your veins. Fluids and medicine will flow directly into your body through the IV tube.   The area over the facet joint where the injection will be made will be cleaned with an antiseptic soap. The surrounding skin will be covered with sterile drapes.   An anesthetic will be applied to your skin to make the injection area numb. You may feel a temporary stinging or burning sensation.   A video X-ray machine will be used to locate the joint. A contrast dye may be injected into the facet  joint area to help with locating the joint.   When the joint is located, an anesthetic medicine will be injected into the joint through the needle.   Your health care provider will ask you whether you feel pain relief. If you do feel relief, a steroid may be injected to provide pain relief for a longer period of time. If you do not feel relief or feel only partial relief, additional injections of an anesthetic may be made in other facet joints.   The needle will be removed, the skin will be cleansed, and bandages will be applied.  AFTER THE PROCEDURE   You will be observed for 15-30 minutes before being allowed to go home. Do not drive. Have an adult drive you or take a taxi or public transportation instead.   If you feel pain relief, the pain will return in several hours or days when the anesthetic wears off.   You may feel pain relief 2-14 days after the procedure. The amount  of time this relief lasts varies from person to person.   It is normal to feel some tenderness over the injected area(s) for 2 days following the procedure.   If you have diabetes, you may have a temporary increase in blood sugar.   This information is not intended to replace advice given to you by your health care provider. Make sure you discuss any questions you have with your health care provider.   Document Released: 12/23/2006 Document Revised: 08/24/2014 Document Reviewed: 05/23/2012 Elsevier Interactive Patient Education [employer].

## 2015-09-02 NOTE — Progress Notes (Signed)
Safety precautions to be maintained throughout the outpatient stay will include: orient to surroundings, keep bed in low position, maintain call bell within reach at all times, provide assistance with transfer out of bed and ambulation.  

## 2015-09-03 ENCOUNTER — Telehealth: Payer: Self-pay | Admitting: *Deleted

## 2015-09-03 NOTE — Telephone Encounter (Signed)
Denies complications post procedure. 

## 2015-09-04 ENCOUNTER — Ambulatory Visit: Payer: Medicare Other | Admitting: Pain Medicine

## 2015-09-23 ENCOUNTER — Ambulatory Visit: Payer: Medicare Other | Attending: Pain Medicine | Admitting: Pain Medicine

## 2015-09-23 ENCOUNTER — Encounter: Payer: Self-pay | Admitting: Pain Medicine

## 2015-09-23 VITALS — BP 125/63 | HR 79 | Temp 98.1°F | Resp 18 | Ht 61.0 in | Wt 180.0 lb

## 2015-09-23 DIAGNOSIS — M7550 Bursitis of unspecified shoulder: Secondary | ICD-10-CM | POA: Diagnosis not present

## 2015-09-23 DIAGNOSIS — Z9889 Other specified postprocedural states: Secondary | ICD-10-CM | POA: Insufficient documentation

## 2015-09-23 DIAGNOSIS — M51369 Other intervertebral disc degeneration, lumbar region without mention of lumbar back pain or lower extremity pain: Secondary | ICD-10-CM

## 2015-09-23 DIAGNOSIS — M7551 Bursitis of right shoulder: Secondary | ICD-10-CM

## 2015-09-23 DIAGNOSIS — M7060 Trochanteric bursitis, unspecified hip: Secondary | ICD-10-CM

## 2015-09-23 DIAGNOSIS — M4316 Spondylolisthesis, lumbar region: Secondary | ICD-10-CM | POA: Diagnosis not present

## 2015-09-23 DIAGNOSIS — M545 Low back pain: Secondary | ICD-10-CM | POA: Diagnosis present

## 2015-09-23 DIAGNOSIS — M19019 Primary osteoarthritis, unspecified shoulder: Secondary | ICD-10-CM | POA: Diagnosis not present

## 2015-09-23 DIAGNOSIS — M5136 Other intervertebral disc degeneration, lumbar region: Secondary | ICD-10-CM | POA: Diagnosis not present

## 2015-09-23 DIAGNOSIS — M533 Sacrococcygeal disorders, not elsewhere classified: Secondary | ICD-10-CM | POA: Diagnosis not present

## 2015-09-23 DIAGNOSIS — M79606 Pain in leg, unspecified: Secondary | ICD-10-CM | POA: Diagnosis present

## 2015-09-23 DIAGNOSIS — M5126 Other intervertebral disc displacement, lumbar region: Secondary | ICD-10-CM | POA: Insufficient documentation

## 2015-09-23 DIAGNOSIS — M47817 Spondylosis without myelopathy or radiculopathy, lumbosacral region: Secondary | ICD-10-CM

## 2015-09-23 DIAGNOSIS — G588 Other specified mononeuropathies: Secondary | ICD-10-CM | POA: Diagnosis not present

## 2015-09-23 DIAGNOSIS — S32000A Wedge compression fracture of unspecified lumbar vertebra, initial encounter for closed fracture: Secondary | ICD-10-CM

## 2015-09-23 DIAGNOSIS — M5481 Occipital neuralgia: Secondary | ICD-10-CM

## 2015-09-23 DIAGNOSIS — M19011 Primary osteoarthritis, right shoulder: Secondary | ICD-10-CM

## 2015-09-23 MED ORDER — HYDROCODONE-ACETAMINOPHEN 5-325 MG PO TABS: ORAL_TABLET | ORAL | Status: DC

## 2015-09-23 NOTE — Progress Notes (Signed)
Safety precautions to be maintained throughout the outpatient stay will include: orient to surroundings, keep bed in low position, maintain call bell within reach at all times, provide assistance with transfer out of bed and ambulation.  

## 2015-09-23 NOTE — Progress Notes (Signed)
Subjective:    Patient ID: Janice Perkins, female    DOB: 13-Oct-1932, 82 y.o.   MRN: [id number]  HPI  The patient is an 82 year old female who returns to pain management for further evaluation and treatment of pain involving the mid lower back lower extremity region. The patient states the pain radiates from the lumbar region toward the buttocks on the left as well as on the right and becomes more severe as patient spends more time on the feet. The patient is undergone evaluation by Dr. Rudene Christians is recommended patient return to pain management for further evaluation and treatment. We discussed patient's condition on today's visit. Patient stated the pain radiates across lower back for the buttocks on the left as well as on the right. The patient stated the pain becomes more intense as patient spends more time on the feet. The patient's pain was reproduced to severe degree with palpation over the PSIS and PII S regions. There is concern regarding patient being with significant component of pain due to sacroiliitis sacroiliac joint dysfunction. We will schedule patient for sacroiliac joint injection to be performed at time return appointment. The patient agreed to suggested treatment plan. The patient will continue hydrocodone acetaminophen as prescribed. All agreed to suggested treatment plan.    Review of Systems     Objective:   Physical Exam  There was tenderness to palpation of the paraspinal musculature region cervical region cervical facet region a mild to moderate degree with mild tenderness over the cervical facet cervical paraspinal musculature region as well as the splenius capitis and occipitalis muscles region. There was tenderness over the thoracic facet thoracic paraspinal musculature region with no crepitus of the thoracic region noted. The patient appeared to be with slightly decreased grip strength with Tinel and Phalen's maneuver reproducing mild discomfort. Palpation over the thoracic  facet thoracic paraspinal musculature region was attends to palpation of moderate degree with no crepitus of the thoracic region noted. Palpation over the lumbar paraspinal musculature region lumbar facet region was attends to palpation of moderate degree with lateral bending rotation extension and palpation of the lumbar facets reproducing moderate discomfort. Moderate tenderness over the PSIS and PII S region was severe tenderness to palpation around the left and on the right with reevaluation to palpation over the PSIS and PII S regions. There was straight leg raising tolerates approximately 20 without a definite increase of pain with dorsiflexion noted. There appeared to be negative clonus negative Homans. There was mild tenderness of the greater trochanteric region and iliotibial band region there was negative clonus negative Homans. Abdomen nontender with no costovertebral tenderness noted. Predominant portion of patient's pain was reproduced with palpation over the PSIS and PII S regions.      Assessment & Plan:   Sacroiliac joint dysfunction  Degenerative joint disease of shoulder  Degenerative disc disease lumbar spine Suspected acute compression fracture at L3, superiorly on the LEFT. No retropulsion.  Moderate multifactorial spinal stenosis is suspected at L2-3 and L3-4. Previous lumbar surgery at L4-5 with adequate posterior decompression. Incomplete arthrodesis with 4 mm anterolisthesis. Probable solid arthrodesis at L5-S1 with BILATERAL foraminal Narrowing Moderate involvement L3-4 and L4-L5 with broad-based disc bulge and facet arthropathy present at L2-3, L3-4, L4-5, and L5-S1  Lumbar facet syndrome   Gluteal and piriformis neuralgia  Bursitis of shoulder  Degenerative joint disease of shoulder     PLAN   Continue present medication hydrocodone acetaminophen  Sacroiliac joint injection to be performed at time of  return appointment  Cluneal and sciatic nerve  blocks to be performed at time return appointment  F/U PCP Dr.Hande  for evaliation of  BP and general medical  condition  F/U surgical evaluation. May consider pending follow-up evaluations  F/U neurological evaluation. May consider pending follow-up evaluations  May consider radiofrequency rhizolysis or intraspinal procedures pending response to present treatment and F/U evaluation   Patient to call pain management prior to scheduled return appointment for any concerns

## 2015-09-23 NOTE — Patient Instructions (Addendum)
PLAN   Continue present medication hydrocodone acetaminophen  Sacroiliac joint injection to be performed at time of return appointment  Cluneal and sciatic nerve blocks to be performed at time return appointment  F/U PCP Dr.Hande  for evaliation of  BP and general medical  condition  F/U surgical evaluation. May consider pending follow-up evaluations  F/U neurological evaluation. May consider pending follow-up evaluations  May consider radiofrequency rhizolysis or intraspinal procedures pending response to present treatment and F/U evaluation   Patient to call pain management prior to scheduled return appointment for any concernsSacroiliac (SI) Joint Injection Patient Information  Description: The sacroiliac joint connects the scrum (very low back and tailbone) to the ilium (a pelvic bone which also forms half of the hip joint).  Normally this joint experiences very little motion.  When this joint becomes inflamed or unstable low back and or hip and pelvis pain may result.  Injection of this joint with local anesthetics (numbing medicines) and steroids can provide diagnostic information and reduce pain.  This injection is performed with the aid of x-ray guidance into the tailbone area while you are lying on your stomach.   You may experience an electrical sensation down the leg while this is being done.  You may also experience numbness.  We also may ask if we are reproducing your normal pain during the injection.  Conditions which may be treated SI injection:   Low back, buttock, hip or leg pain  Preparation for the Injection:  1. Do not eat any solid food or dairy products within 6 hours of your appointment.  2. You may drink clear liquids up to 2 hours before appointment.  Clear liquids include water, black coffee, juice or soda.  No milk or cream please. 3. You may take your regular medications, including pain medications with a sip of water before your appointment.  Diabetics  should hold regular insulin (if take separately) and take 1/2 normal NPH dose the morning of the procedure.  Carry some sugar containing items with you to your appointment. 4. A driver must accompany you and be prepared to drive you home after your procedure. 5. Bring all of your current medications with you. 6. An IV may be inserted and sedation may be given at the discretion of the physician. 7. A blood pressure cuff, EKG and other monitors will often be applied during the procedure.  Some patients may need to have extra oxygen administered for a short period.  8. You will be asked to provide medical information, including your allergies, prior to the procedure.  We must know immediately if you are taking blood thinners (like Coumadin/Warfarin) or if you are allergic to IV iodine contrast (dye).  We must know if you could possible be pregnant.  Possible side effects:   Bleeding from needle site  Infection (rare, may require surgery)  Nerve injury (rare)  Numbness & tingling (temporary)  A brief convulsion or seizure  Light-headedness (temporary)  Pain at injection site (several days)  Decreased blood pressure (temporary)  Weakness in the leg (temporary)   Call if you experience:   New onset weakness or numbness of an extremity below the injection site that last more than 8 hours.  Hives or difficulty breathing ( go to the emergency room)  Inflammation or drainage at the injection site  Any new symptoms which are concerning to you  Please note:  Although the local anesthetic injected can often make your back/ hip/ buttock/ leg feel good for several  hours after the injections, the pain will likely return.  It takes 3-7 days for steroids to work in the sacroiliac area.  You may not notice any pain relief for at least that one week.  If effective, we will often do a series of three injections spaced 3-6 weeks apart to maximally decrease your pain.  After the initial series,  we generally will wait some months before a repeat injection of the same type.  If you have any questions, please call [phone] [place]  What are the risk, side effects and possible complications? Generally speaking, most procedures are safe.  However, with any procedure there are risks, side effects, and the possibility of complications.  The risks and complications are dependent upon the sites that are lesioned, or the type of nerve block to be performed.  The closer the procedure is to the spine, the more serious the risks are.  Great care is taken when placing the radio frequency needles, block needles or lesioning probes, but sometimes complications can occur. 1. Infection: Any time there is an injection through the skin, there is a risk of infection.  This is why sterile conditions are used for these blocks.  There are four possible types of infection. 1. Localized skin infection. 2. Central Nervous System Infection-This can be in the form of Meningitis, which can be deadly. 3. Epidural Infections-This can be in the form of an epidural abscess, which can cause pressure inside of the spine, causing compression of the spinal cord with subsequent paralysis. This would require an emergency surgery to decompress, and there are no guarantees that the patient would recover from the paralysis. 4. Discitis-This is an infection of the intervertebral discs.  It occurs in about 1% of discography procedures.  It is difficult to treat and it may lead to surgery.        2. Pain: the needles have to go through skin and soft tissues, will cause soreness.       3. Damage to internal structures:  The nerves to be lesioned may be near blood vessels or    other nerves which can be potentially damaged.       4. Bleeding: Bleeding is more common if the patient is taking blood thinners such as  aspirin, Coumadin, Ticiid, Plavix, etc., or if  he/she have some genetic predisposition  such as hemophilia. Bleeding into the spinal canal can cause compression of the spinal  cord with subsequent paralysis.  This would require an emergency surgery to  decompress and there are no guarantees that the patient would recover from the  paralysis.       5. Pneumothorax:  Puncturing of a lung is a possibility, every time a needle is introduced in  the area of the chest or upper back.  Pneumothorax refers to free air around the  collapsed lung(s), inside of the thoracic cavity (chest cavity).  Another two possible  complications related to a similar event would include: Hemothorax and Chylothorax.   These are variations of the Pneumothorax, where instead of air around the collapsed  lung(s), you may have blood or chyle, respectively.       6. Spinal headaches: They may occur with any procedures in the area of the spine.       7. Persistent CSF (Cerebro-Spinal Fluid) leakage: This is a rare problem, but may occur  with prolonged intrathecal or epidural catheters either due to the formation  of a fistulous  track or a dural tear.       8. Nerve damage: By working so close to the spinal cord, there is always a possibility of  nerve damage, which could be as serious as a permanent spinal cord injury with  paralysis.       9. Death:  Although rare, severe deadly allergic reactions known as "Anaphylactic  reaction" can occur to any of the medications used.      10. Worsening of the symptoms:  We can always make thing worse.  What are the chances of something like this happening? Chances of any of this occuring are extremely low.  By statistics, you have more of a chance of getting killed in a motor vehicle accident: while driving to the hospital than any of the above occurring .  Nevertheless, you should be aware that they are possibilities.  In general, it is similar to taking a shower.  Everybody knows that you can slip, hit your head and get killed.  Does that mean  that you should not shower again?  Nevertheless always keep in mind that statistics do not mean anything if you happen to be on the wrong side of them.  Even if a procedure has a 1 (one) in a 1,000,000 (million) chance of going wrong, it you happen to be that one..Also, keep in mind that by statistics, you have more of a chance of having something go wrong when taking medications.  Who should not have this procedure? If you are on a blood thinning medication (e.g. Coumadin, Plavix, see list of "Blood Thinners"), or if you have an active infection going on, you should not have the procedure.  If you are taking any blood thinners, please inform your physician.  How should I prepare for this procedure?  Do not eat or drink anything at least six hours prior to the procedure.  Bring a driver with you .  It cannot be a taxi.  Come accompanied by an adult that can drive you back, and that is strong enough to help you if your legs get weak or numb from the local anesthetic.  Take all of your medicines the morning of the procedure with just enough water to swallow them.  If you have diabetes, make sure that you are scheduled to have your procedure done first thing in the morning, whenever possible.  If you have diabetes, take only half of your insulin dose and notify our nurse that you have done so as soon as you arrive at the clinic.  If you are diabetic, but only take blood sugar pills (oral hypoglycemic), then do not take them on the morning of your procedure.  You may take them after you have had the procedure.  Do not take aspirin or any aspirin-containing medications, at least eleven (11) days prior to the procedure.  They may prolong bleeding.  Wear loose fitting clothing that may be easy to take off and that you would not mind if it got stained with Betadine or blood.  Do not wear any jewelry or perfume  Remove any nail coloring.  It will interfere with some of our monitoring  equipment.  NOTE: Remember that this is not meant to be interpreted as a complete list of all possible complications.  Unforeseen problems may occur.  BLOOD THINNERS The following drugs contain aspirin or other products, which can cause increased bleeding during surgery and should not be taken for 2 weeks prior to and  1 week after surgery.  If you should need take something for relief of minor pain, you may take acetaminophen which is found in Tylenol,m Datril, Anacin-3 and Panadol. It is not blood thinner. The products listed below are.  Do not take any of the products listed below in addition to any listed on your instruction sheet.  A.P.C or A.P.C with Codeine Codeine Phosphate Capsules #3 Ibuprofen Ridaura  ABC compound Congesprin Imuran rimadil  Advil Cope Indocin Robaxisal  Alka-Seltzer Effervescent Pain Reliever and Antacid Coricidin or Coricidin-D  Indomethacin Rufen  Alka-Seltzer plus Cold Medicine Cosprin Ketoprofen S-A-C Tablets  Anacin Analgesic Tablets or Capsules Coumadin Korlgesic Salflex  Anacin Extra Strength Analgesic tablets or capsules CP-2 Tablets Lanoril Salicylate  Anaprox Cuprimine Capsules Levenox Salocol  Anexsia-D Dalteparin Magan Salsalate  Anodynos Darvon compound Magnesium Salicylate Sine-off  Ansaid Dasin Capsules Magsal Sodium Salicylate  Anturane Depen Capsules Marnal Soma  APF Arthritis pain formula Dewitt's Pills Measurin Stanback  Argesic Dia-Gesic Meclofenamic Sulfinpyrazone  Arthritis Bayer Timed Release Aspirin Diclofenac Meclomen Sulindac  Arthritis pain formula Anacin Dicumarol Medipren Supac  Analgesic (Safety coated) Arthralgen Diffunasal Mefanamic Suprofen  Arthritis Strength Bufferin Dihydrocodeine Mepro Compound Suprol  Arthropan liquid Dopirydamole Methcarbomol with Aspirin Synalgos  ASA tablets/Enseals Disalcid Micrainin Tagament  Ascriptin Doan's Midol Talwin  Ascriptin A/D Dolene Mobidin Tanderil  Ascriptin Extra Strength Dolobid  Moblgesic Ticlid  Ascriptin with Codeine Doloprin or Doloprin with Codeine Momentum Tolectin  Asperbuf Duoprin Mono-gesic Trendar  Aspergum Duradyne Motrin or Motrin IB Triminicin  Aspirin plain, buffered or enteric coated Durasal Myochrisine Trigesic  Aspirin Suppositories Easprin Nalfon Trillsate  Aspirin with Codeine Ecotrin Regular or Extra Strength Naprosyn Uracel  Atromid-S Efficin Naproxen Ursinus  Auranofin Capsules Elmiron Neocylate Vanquish  Axotal Emagrin Norgesic Verin  Azathioprine Empirin or Empirin with Codeine Normiflo Vitamin E  Azolid Emprazil Nuprin Voltaren  Bayer Aspirin plain, buffered or children's or timed BC Tablets or powders Encaprin Orgaran Warfarin Sodium  Buff-a-Comp Enoxaparin Orudis Zorpin  Buff-a-Comp with Codeine Equegesic Os-Cal-Gesic   Buffaprin Excedrin plain, buffered or Extra Strength Oxalid   Bufferin Arthritis Strength Feldene Oxphenbutazone   Bufferin plain or Extra Strength Feldene Capsules Oxycodone with Aspirin   Bufferin with Codeine Fenoprofen Fenoprofen Pabalate or Pabalate-SF   Buffets II Flogesic Panagesic   Buffinol plain or Extra Strength Florinal or Florinal with Codeine Panwarfarin   Buf-Tabs Flurbiprofen Penicillamine   Butalbital Compound Four-way cold tablets Penicillin   Butazolidin Fragmin Pepto-Bismol   Carbenicillin Geminisyn Percodan   Carna Arthritis Reliever Geopen Persantine   Carprofen Gold's salt Persistin   Chloramphenicol Goody's Phenylbutazone   Chloromycetin Haltrain Piroxlcam   Clmetidine heparin Plaquenil   Cllnoril Hyco-pap Ponstel   Clofibrate Hydroxy chloroquine Propoxyphen         Before stopping any of these medications, be sure to consult the physician who ordered them.  Some, such as Coumadin (Warfarin) are ordered to prevent or treat serious conditions such as "deep thrombosis", "pumonary embolisms", and other heart problems.  The amount of time that you may need off of the medication may also vary with  the medication and the reason for which you were taking it.  If you are taking any of these medications, please make sure you notify your pain physician before you undergo any procedures.

## 2015-09-27 ENCOUNTER — Other Ambulatory Visit: Payer: Self-pay | Admitting: Pain Medicine

## 2015-09-30 ENCOUNTER — Encounter: Payer: Self-pay | Admitting: Pain Medicine

## 2015-09-30 ENCOUNTER — Ambulatory Visit: Payer: Medicare Other | Attending: Pain Medicine | Admitting: Pain Medicine

## 2015-09-30 VITALS — BP 149/84 | HR 73 | Temp 97.9°F | Resp 16 | Ht 61.0 in | Wt 180.0 lb

## 2015-09-30 DIAGNOSIS — M79606 Pain in leg, unspecified: Secondary | ICD-10-CM | POA: Diagnosis present

## 2015-09-30 DIAGNOSIS — M19011 Primary osteoarthritis, right shoulder: Secondary | ICD-10-CM

## 2015-09-30 DIAGNOSIS — M7060 Trochanteric bursitis, unspecified hip: Secondary | ICD-10-CM

## 2015-09-30 DIAGNOSIS — M7551 Bursitis of right shoulder: Secondary | ICD-10-CM

## 2015-09-30 DIAGNOSIS — M545 Low back pain: Secondary | ICD-10-CM | POA: Diagnosis present

## 2015-09-30 DIAGNOSIS — M4316 Spondylolisthesis, lumbar region: Secondary | ICD-10-CM | POA: Diagnosis not present

## 2015-09-30 DIAGNOSIS — M5481 Occipital neuralgia: Secondary | ICD-10-CM

## 2015-09-30 DIAGNOSIS — M47816 Spondylosis without myelopathy or radiculopathy, lumbar region: Secondary | ICD-10-CM | POA: Diagnosis not present

## 2015-09-30 DIAGNOSIS — M5126 Other intervertebral disc displacement, lumbar region: Secondary | ICD-10-CM | POA: Insufficient documentation

## 2015-09-30 DIAGNOSIS — M533 Sacrococcygeal disorders, not elsewhere classified: Secondary | ICD-10-CM

## 2015-09-30 DIAGNOSIS — S32000A Wedge compression fracture of unspecified lumbar vertebra, initial encounter for closed fracture: Secondary | ICD-10-CM

## 2015-09-30 DIAGNOSIS — M5136 Other intervertebral disc degeneration, lumbar region: Secondary | ICD-10-CM

## 2015-09-30 DIAGNOSIS — M47817 Spondylosis without myelopathy or radiculopathy, lumbosacral region: Secondary | ICD-10-CM

## 2015-09-30 MED ORDER — CIPROFLOXACIN IN D5W 400 MG/200ML IV SOLN
400.0000 mg | Freq: Once | INTRAVENOUS | Status: AC
  Administered 2015-09-30: 400 mg via INTRAVENOUS

## 2015-09-30 MED ORDER — CIPROFLOXACIN HCL 250 MG PO TABS: 250.0000 mg | ORAL_TABLET | Freq: Two times a day (BID) | ORAL | Status: DC

## 2015-09-30 MED ORDER — BUPIVACAINE HCL (PF) 0.25 % IJ SOLN
INTRAMUSCULAR | Status: AC
  Administered 2015-09-30: 30 mL
  Filled 2015-09-30: qty 30

## 2015-09-30 MED ORDER — CIPROFLOXACIN IN D5W 400 MG/200ML IV SOLN
INTRAVENOUS | Status: AC
  Administered 2015-09-30: 400 mg via INTRAVENOUS
  Filled 2015-09-30: qty 200

## 2015-09-30 MED ORDER — FENTANYL CITRATE (PF) 100 MCG/2ML IJ SOLN
100.0000 ug | Freq: Once | INTRAMUSCULAR | Status: AC
  Administered 2015-09-30: 50 ug via INTRAVENOUS

## 2015-09-30 MED ORDER — LACTATED RINGERS IV SOLN: 1000.0000 mL | INTRAVENOUS | Status: DC

## 2015-09-30 MED ORDER — MIDAZOLAM HCL 5 MG/5ML IJ SOLN
5.0000 mg | Freq: Once | INTRAMUSCULAR | Status: AC
  Administered 2015-09-30: 2 mg via INTRAVENOUS

## 2015-09-30 MED ORDER — TRIAMCINOLONE ACETONIDE 40 MG/ML IJ SUSP
INTRAMUSCULAR | Status: AC
  Administered 2015-09-30: 40 mg
  Filled 2015-09-30: qty 1

## 2015-09-30 MED ORDER — TRIAMCINOLONE ACETONIDE 40 MG/ML IJ SUSP
40.0000 mg | Freq: Once | INTRAMUSCULAR | Status: AC
  Administered 2015-09-30: 40 mg

## 2015-09-30 MED ORDER — MIDAZOLAM HCL 5 MG/5ML IJ SOLN
INTRAMUSCULAR | Status: AC
  Administered 2015-09-30: 2 mg via INTRAVENOUS
  Filled 2015-09-30: qty 5

## 2015-09-30 MED ORDER — ORPHENADRINE CITRATE 30 MG/ML IJ SOLN
INTRAMUSCULAR | Status: AC
  Administered 2015-09-30: 60 mg via INTRAMUSCULAR
  Filled 2015-09-30: qty 2

## 2015-09-30 MED ORDER — FENTANYL CITRATE (PF) 100 MCG/2ML IJ SOLN
INTRAMUSCULAR | Status: AC
  Administered 2015-09-30: 50 ug via INTRAVENOUS
  Filled 2015-09-30: qty 2

## 2015-09-30 MED ORDER — ORPHENADRINE CITRATE 30 MG/ML IJ SOLN
60.0000 mg | Freq: Once | INTRAMUSCULAR | Status: AC
  Administered 2015-09-30: 60 mg via INTRAMUSCULAR

## 2015-09-30 MED ORDER — BUPIVACAINE HCL (PF) 0.25 % IJ SOLN
30.0000 mL | Freq: Once | INTRAMUSCULAR | Status: AC
  Administered 2015-09-30: 30 mL

## 2015-09-30 NOTE — Progress Notes (Signed)
Safety precautions to be maintained throughout the outpatient stay will include: orient to surroundings, keep bed in low position, maintain call bell within reach at all times, provide assistance with transfer out of bed and ambulation.  

## 2015-09-30 NOTE — Patient Instructions (Addendum)
PLAN   Continue present medication hydrocodone acetaminophen Please get Cipro antibiotic and begin taking today DO NOT TAKE ANY ASPIRIN today or tomorrow Tuesday, 09/03/2015  F/U PCP Dr.Hande  for evaliation of  BP and general medical  condition  F/U surgical evaluation. May consider pending follow-up evaluations  Ask receptionist the date of your appointment with Dr.Menz . I would like for you to see Dr.Menz this week   F/U neurological evaluation. May consider pending follow-up evaluations  May consider radiofrequency rhizolysis or intraspinal procedures pending response to present treatment and F/U evaluation   Patient to call Pain Management Center should patient have concerns prior to scheduled return appointment.Sacroiliac (SI) Joint Injection Patient Information  Description: The sacroiliac joint connects the scrum (very low back and tailbone) to the ilium (a pelvic bone which also forms half of the hip joint).  Normally this joint experiences very little motion.  When this joint becomes inflamed or unstable low back and or hip and pelvis pain may result.  Injection of this joint with local anesthetics (numbing medicines) and steroids can provide diagnostic information and reduce pain.  This injection is performed with the aid of x-ray guidance into the tailbone area while you are lying on your stomach.   You may experience an electrical sensation down the leg while this is being done.  You may also experience numbness.  We also may ask if we are reproducing your normal pain during the injection.  Conditions which may be treated SI injection:   Low back, buttock, hip or leg pain  Preparation for the Injection:  1. Do not eat any solid food or dairy products within 6 hours of your appointment.  2. You may drink clear liquids up to 2 hours before appointment.  Clear liquids include water, black coffee, juice or soda.  No milk or cream please. 3. You may take your regular  medications, including pain medications with a sip of water before your appointment.  Diabetics should hold regular insulin (if take separately) and take 1/2 normal NPH dose the morning of the procedure.  Carry some sugar containing items with you to your appointment. 4. A driver must accompany you and be prepared to drive you home after your procedure. 5. Bring all of your current medications with you. 6. An IV may be inserted and sedation may be given at the discretion of the physician. 7. A blood pressure cuff, EKG and other monitors will often be applied during the procedure.  Some patients may need to have extra oxygen administered for a short period.  8. You will be asked to provide medical information, including your allergies, prior to the procedure.  We must know immediately if you are taking blood thinners (like Coumadin/Warfarin) or if you are allergic to IV iodine contrast (dye).  We must know if you could possible be pregnant.  Possible side effects:   Bleeding from needle site  Infection (rare, may require surgery)  Nerve injury (rare)  Numbness & tingling (temporary)  A brief convulsion or seizure  Light-headedness (temporary)  Pain at injection site (several days)  Decreased blood pressure (temporary)  Weakness in the leg (temporary)   Call if you experience:   New onset weakness or numbness of an extremity below the injection site that last more than 8 hours.  Hives or difficulty breathing ( go to the emergency room)  Inflammation or drainage at the injection site  Any new symptoms which are concerning to you  Please note:  Although the local anesthetic injected can often make your back/ hip/ buttock/ leg feel good for several hours after the injections, the pain will likely return.  It takes 3-7 days for steroids to work in the sacroiliac area.  You may not notice any pain relief for at least that one week.  If effective, we will often do a series of  three injections spaced 3-6 weeks apart to maximally decrease your pain.  After the initial series, we generally will wait some months before a repeat injection of the same type.  If you have any questions, please call [phone] [facility]  Pain Management Discharge Instructions  General Discharge Instructions :  If you need to reach your doctor call: Monday-Friday 8:00 am - 4:00 pm at [phone] or toll free [phone].  After clinic hours [phone] to have operator reach doctor.  Bring all of your medication bottles to all your appointments in the pain clinic.  To cancel or reschedule your appointment with Pain Management please remember to call 24 hours in advance to avoid a fee.  Refer to the educational materials which you have been given on: General Risks, I had my Procedure. Discharge Instructions, Post Sedation.  Post Procedure Instructions:  The drugs you were given will stay in your system until tomorrow, so for the next 24 hours you should not drive, make any legal decisions or drink any alcoholic beverages.  You may eat anything you prefer, but it is better to start with liquids then soups and crackers, and gradually work up to solid foods.  Please notify your doctor immediately if you have any unusual bleeding, trouble breathing or pain that is not related to your normal pain.  Depending on the type of procedure that was done, some parts of your body may feel week and/or numb.  This usually clears up by tonight or the next day.  Walk with the use of an assistive device or accompanied by an adult for the 24 hours.  You may use ice on the affected area for the first 24 hours.  Put ice in a Ziploc bag and cover with a towel and place against area 15 minutes on 15 minutes off.  You may switch to heat after 24 hours.

## 2015-09-30 NOTE — Progress Notes (Signed)
Subjective:    Patient ID: Janice Perkins, female    DOB: [birth year], 82 y.o.   MRN: [id number]  HPI  NOTE:  The patient is a 82 y.o. female who returns to the Pain Management Center for further evaluation and treatment of pain involving the lumbar and lower extremity region with pain involving the region of the hip and buttocks as well.  Prior studies reveal patient to be with evidence of degenerative changes of the lumbar spine with prior MRI revealing Moderate multifactorial spinal stenosis is suspected at L2-3 and L3-4. Previous lumbar surgery at L4-5 with adequate posterior decompression. Incomplete arthrodesis with 4 mm anterolisthesis. Probable solid arthrodesis at L5-S1 with BILATERAL foraminal Narrowing Moderate involvement L3-4 and L4-L5 with broad-based disc bulge and facet arthropathy present at L2-3, L3-4, L4-5, and L5-S1.  the patient is with reproduction of severe pain with palpation over the PSIS and PII S regions and is with positive Patrick's maneuver There is concern regarding patient's pain being due to significant component of sacroiliac joint dysfunction. We have discussed patient's condition.  The risks, benefits, and expectations of the procedure have been discussed and explained to the patient who is understanding and wishes to proceed with interventional treatment as planned.    PROCEDURE: Sacroiliac joint on the left side injection with IV Versed, IV Fentanyl conscious sedation, EKG, blood pressure, pulse, pulse oximetry monitoring.  Procedure was performed with patient in prone position under fluoroscopic guidance.    Inferior pole sacroiliac joint injection on the left side.  With patient in prone position and Betadine prep of proposed entry site of accomplished, 22 -gauge needle was inserted at the inferior pole of the sacroiliac joint on the left under fluoroscopic guidance.  Following documentation of needle placement at the inferior pole of the sacroiliac joint on  the left, a total of 1cc 0.25% bupivacaine with kenalog was injected for left inferior pole sacroiliac joint injection.  Needle was removed.    Superior pole sacroiliac joint injection on the left side.  With patient in prone position and Betadine prep of proposed entry site of accomplished, 22-gauge needle was inserted at the superior pole of the sacroiliac joint on the left under fluoroscopic guidance.  Following documentation of needle placement at the inferior pole of the sacroiliac joint on the left, a total of 1cc 0.25% bupivacaine with kenalog was injected for left superior pole sacroiliac joint injection.  Needle was removed.   SACROILIAC JOINT INJECTION ON THE RIGHT SIDE  The procedure was performed on the right side exactly as was performed on the left side utilizing the same technique and under fluoroscopic guidance for both superior pole sacroiliac joint injections and inferior pole sacroiliac joint injections on the right side  Myoneural block injection of the gluteal musculature region Following Betadine prep of proposed entry site a 22-gauge needle was inserted in the gluteal musculature region and following negative aspiration 2 cc of 0.25% bupivacaine with Norflex was injected for myoneural block injection of the gluteal musculature region times two.     Patient tolerated procedure well.  A total of 10 mg Kenalog was utilized for the entire procedure.  PLAN:    1. Medications: Will continue presently prescribed medication hydrocodone acetaminophen 2. Patient to follow up with primary care physician Dr.Hande  for evaluation of blood pressure and general medical condition status post sacroiliac joint injection performed on today's visit. 3. Surgical evaluation is discussed.Has been addressed  4. Neurological evaluation with PNCV/EMG studies may be considered.  5. Patient may be candidate for radiofrequency procedures, Botox injections, implantation-type procedures, and other  treatment pending response to treatment and follow-up evaluation. 6. Patient has been advised to adhere to proper body mechanics and to call Pain Management Center prior to scheduled return appointment should there be significant change in condition or patient have other concerns regarding condition prior to scheduled return appointment.    Patient with understanding and in agreement with suggested treatment plan.    Review of Systems     Objective:   Physical Exam        Assessment & Plan:

## 2015-10-01 ENCOUNTER — Telehealth: Payer: Self-pay | Admitting: *Deleted

## 2015-10-01 ENCOUNTER — Ambulatory Visit: Payer: Medicare Other | Admitting: Pain Medicine

## 2015-10-01 NOTE — Telephone Encounter (Signed)
No problems post procedure. 

## 2015-10-11 ENCOUNTER — Other Ambulatory Visit: Payer: Self-pay | Admitting: Pain Medicine

## 2015-10-21 ENCOUNTER — Ambulatory Visit: Payer: Medicare Other | Attending: Pain Medicine | Admitting: Pain Medicine

## 2015-10-21 ENCOUNTER — Encounter: Payer: Self-pay | Admitting: Pain Medicine

## 2015-10-21 VITALS — BP 119/91 | HR 72 | Temp 98.3°F | Resp 18 | Ht 61.0 in | Wt 180.0 lb

## 2015-10-21 DIAGNOSIS — M533 Sacrococcygeal disorders, not elsewhere classified: Secondary | ICD-10-CM | POA: Diagnosis not present

## 2015-10-21 DIAGNOSIS — M5126 Other intervertebral disc displacement, lumbar region: Secondary | ICD-10-CM | POA: Diagnosis not present

## 2015-10-21 DIAGNOSIS — M7060 Trochanteric bursitis, unspecified hip: Secondary | ICD-10-CM

## 2015-10-21 DIAGNOSIS — Z9889 Other specified postprocedural states: Secondary | ICD-10-CM | POA: Insufficient documentation

## 2015-10-21 DIAGNOSIS — M47817 Spondylosis without myelopathy or radiculopathy, lumbosacral region: Secondary | ICD-10-CM

## 2015-10-21 DIAGNOSIS — M5481 Occipital neuralgia: Secondary | ICD-10-CM

## 2015-10-21 DIAGNOSIS — M545 Low back pain: Secondary | ICD-10-CM | POA: Diagnosis present

## 2015-10-21 DIAGNOSIS — M19019 Primary osteoarthritis, unspecified shoulder: Secondary | ICD-10-CM | POA: Diagnosis not present

## 2015-10-21 DIAGNOSIS — Z981 Arthrodesis status: Secondary | ICD-10-CM | POA: Diagnosis not present

## 2015-10-21 DIAGNOSIS — G588 Other specified mononeuropathies: Secondary | ICD-10-CM | POA: Insufficient documentation

## 2015-10-21 DIAGNOSIS — M7551 Bursitis of right shoulder: Secondary | ICD-10-CM

## 2015-10-21 DIAGNOSIS — M7550 Bursitis of unspecified shoulder: Secondary | ICD-10-CM | POA: Insufficient documentation

## 2015-10-21 DIAGNOSIS — M1288 Other specific arthropathies, not elsewhere classified, other specified site: Secondary | ICD-10-CM | POA: Diagnosis not present

## 2015-10-21 DIAGNOSIS — M79606 Pain in leg, unspecified: Secondary | ICD-10-CM | POA: Diagnosis present

## 2015-10-21 DIAGNOSIS — M5136 Other intervertebral disc degeneration, lumbar region: Secondary | ICD-10-CM | POA: Insufficient documentation

## 2015-10-21 DIAGNOSIS — S32000A Wedge compression fracture of unspecified lumbar vertebra, initial encounter for closed fracture: Secondary | ICD-10-CM

## 2015-10-21 DIAGNOSIS — M19011 Primary osteoarthritis, right shoulder: Secondary | ICD-10-CM

## 2015-10-21 MED ORDER — HYDROCODONE-ACETAMINOPHEN 5-325 MG PO TABS: ORAL_TABLET | ORAL | Status: DC

## 2015-10-21 NOTE — Patient Instructions (Addendum)
PLAN   Continue present medication hydrocodone acetaminophen.  F/U PCP Dr.Hande  for evaliation of  BP and general medical  condition  F/U surgical evaluation. May consider pending follow-up evaluations  F/U neurological evaluation. May consider pending follow-up evaluations  May consider radiofrequency rhizolysis or intraspinal procedures pending response to present treatment and F/U evaluation   Patient to call pain management prior to scheduled return appointment for any concerns

## 2015-10-21 NOTE — Progress Notes (Signed)
Subjective:    Patient ID: Janice Perkins, female    DOB: 07-09-1933, 82 y.o.   MRN: [id number]  HPI  The patient is an 82 year old female who returns to pain management for further evaluation and treatment of pain involving the lower back and lower extremity region. The patient states that she continues to benefit from previous procedure performed in pain management center.. The patient is status post sacroiliac joint injection performed at time of previous is to pain management Center. The patient states that she is able to stand and walk without experiencing severe disabling pain. We discussed patient's condition and will continue present medications and will consider patient for interventional treatment should they be return of any significant pain. The patient will continue hydrocodone acetaminophen as prescribed this time. The patient later informed us that she did have a fall and that she called herself on her outstretched hands. The patient stated that this caused some exacerbation of the lower back lower extremity pain. The patient stated that she was without severe discomfort at this time and that she would call pain management prior to return appointment should her symptoms persist or become more intense. We will continue hydrocodone acetaminophen and will consider patient for interventional treatment or other modifications of treatment regimen pending follow-up evaluation. All agreed to suggested treatment plan.         Review of Systems     Objective:   Physical Exam  There was tenderness to palpation of the splenius capitis and occipitalis musculature regions of moderate degree. There was moderate tenderness over the cervical facet cervical paraspinal musculature region. The patient was with tenderness of the acromioclavicular and glenohumeral joint regions of mild degree and appeared to be with unremarkable Spurling's maneuver. The patient appeared to be with bilaterally equal  grip strength and Tinel and Phalen's maneuver were without increase of pain of significant degree. There was tenderness to palpation over the thoracic facet thoracic paraspinal musculature region of the lower thoracic region a moderate degree with lateral bending rotation extension and palpation over the lumbar facets reproducing moderate discomfort. Lateral bending rotation extension and palpation of the lumbar facets reproduced moderate discomfort. Straight leg raising was tolerates approximately 20 without a definite increase of pain with dorsiflexion noted. There was negative clonus negative Homans. EHL strength appeared to be decreased. There was tenderness along the PSIS and PII S region a moderate degree with moderate tenderness along the greater trochanteric region and iliotibial band region. No sensory deficit of dermatomal speech was detected. There was negative clonus negative Homans the abdomen was nontender with no costovertebral tenderness noted      Assessment & Plan:     Sacroiliac joint dysfunction  Degenerative joint disease of shoulder  Degenerative disc disease lumbar spine Suspected acute compression fracture at L3, superiorly on the LEFT. No retropulsion.  Moderate multifactorial spinal stenosis is suspected at L2-3 and L3-4. Previous lumbar surgery at L4-5 with adequate posterior decompression. Incomplete arthrodesis with 4 mm anterolisthesis. Probable solid arthrodesis at L5-S1 with BILATERAL foraminal  narrowing Moderate involvement L3-4 and L4-L5 with broad-based disc bulge and facet arthropathy present at L2-3, L3-4, L4-5, and L5-S1  Lumbar facet syndrome   Gluteal and piriformis neuralgia  Bursitis of shoulder  Degenerative joint disease of shoulder      PLAN   Continue present medication hydrocodone acetaminophen.  F/U PCP Dr.Hande  for evaliation of  BP and general medical  condition  F/U surgical evaluation. May consider pending follow-up  evaluations  F/U neurological evaluation. May consider pending follow-up evaluations  May consider radiofrequency rhizolysis or intraspinal procedures pending response to present treatment and F/U evaluation   Patient to call pain management prior to scheduled return appointment for any concerns

## 2015-10-21 NOTE — Progress Notes (Signed)
Safety precautions to be maintained throughout the outpatient stay will include: orient to surroundings, keep bed in low position, maintain call bell within reach at all times, provide assistance with transfer out of bed and ambulation.  

## 2015-11-01 ENCOUNTER — Encounter: Payer: Self-pay | Admitting: Emergency Medicine

## 2015-11-01 ENCOUNTER — Emergency Department
Admission: EM | Admit: 2015-11-01 | Discharge: 2015-11-01 | Disposition: A | Discharge status: Home / Self Care | Payer: Medicare Other | Attending: Emergency Medicine | Admitting: Emergency Medicine

## 2015-11-01 DIAGNOSIS — R21 Rash and other nonspecific skin eruption: Secondary | ICD-10-CM | POA: Diagnosis present

## 2015-11-01 DIAGNOSIS — Z880 Allergy status to penicillin: Secondary | ICD-10-CM | POA: Insufficient documentation

## 2015-11-01 DIAGNOSIS — Z79899 Other long term (current) drug therapy: Secondary | ICD-10-CM | POA: Diagnosis not present

## 2015-11-01 DIAGNOSIS — B029 Zoster without complications: Secondary | ICD-10-CM | POA: Diagnosis not present

## 2015-11-01 DIAGNOSIS — J45909 Unspecified asthma, uncomplicated: Secondary | ICD-10-CM | POA: Diagnosis not present

## 2015-11-01 DIAGNOSIS — I10 Essential (primary) hypertension: Secondary | ICD-10-CM | POA: Insufficient documentation

## 2015-11-01 DIAGNOSIS — C801 Malignant (primary) neoplasm, unspecified: Secondary | ICD-10-CM | POA: Diagnosis not present

## 2015-11-01 MED ORDER — VALACYCLOVIR HCL 1 G PO TABS: 1000.0000 mg | ORAL_TABLET | Freq: Three times a day (TID) | ORAL | Status: DC

## 2015-11-01 NOTE — ED Provider Notes (Signed)
[facility] Emergency Department Provider Note  ____________________________________________  Time seen: Approximately 1:47 PM  I have reviewed the triage vital signs and the nursing notes.   HISTORY  Chief Complaint Rash    HPI Janice Perkins is a 82 y.o. female , NAD, presents to the emergency department with a painful, itchy rash to the left lower back 2-3 days. Pain is radiating around the left side to her abdomen. Has not noted any other lesions. States a family member took a picture of the rash and sent to the [facility] on line and was told she had shingles. Has not had zoster backs. States she had chickenpox as a child. Denies any fever, chills, body aches. Does not have any rash, pain, lesions about her face or neck. Denies any eye pain nor changes in vision. Does know she has chronic lower back pain and is under pain management.    Past Medical History  Diagnosis Date  . Cancer [facility]) 2011    breast  . Hypertension   . COPD (chronic obstructive pulmonary disease) ([place])   . Asthma   . Bronchitis 04/2015    Patient Active Problem List   Diagnosis Date Noted  . DDD (degenerative disc disease), lumbar 01/14/2015  . Lumbosacral facet joint syndrome 01/14/2015  . Sacroiliac joint dysfunction 01/14/2015  . Greater trochanteric bursitis 01/14/2015  . Bilateral occipital neuralgia 01/14/2015    Past Surgical History  Procedure Laterality Date  . Breast excisional biopsy Right     Current Outpatient Rx  Name  Route  Sig  Dispense  Refill  . amLODipine (NORVASC) 5 MG tablet   Oral   Take 2.5 mg by mouth daily. Reported on 09/23/2015         . aspirin 81 MG tablet   Oral   Take 81 mg by mouth 2 (two) times daily.          Marland Kitchen atorvastatin (LIPITOR) 20 MG tablet   Oral   Take 20 mg by mouth daily.         . Cholecalciferol 1000 UNIT/10ML LIQD   Oral   Take 1,000 Int'l Units/day by mouth every morning.         . ciprofloxacin (CIPRO)  250 MG tablet   Oral   Take 1 tablet (250 mg total) by mouth 2 (two) times daily.   14 tablet   0   . ciprofloxacin (CIPRO) 250 MG tablet   Oral   Take 1 tablet (250 mg total) by mouth 2 (two) times daily. Patient not taking: Reported on 10/21/2015   14 tablet   0   . clorazepate (TRANXENE) 7.5 MG tablet   Oral   Take 7.5 mg by mouth 2 (two) times daily as needed for anxiety.         Marland Kitchen esomeprazole (NEXIUM) 40 MG capsule   Oral   Take 40 mg by mouth daily at 12 noon.         . fexofenadine (ALLEGRA) 180 MG tablet   Oral   Take 180 mg by mouth daily.         . fluticasone (FLONASE) 50 MCG/ACT nasal spray   Each Nare   Place into both nostrils daily.         . Fluticasone-Salmeterol (ADVAIR) 100-50 MCG/DOSE AEPB   Inhalation   Inhale 1 puff into the lungs 2 (two) times daily.         . hydrALAZINE (APRESOLINE) 25 MG tablet  Oral   Take 25 mg by mouth 2 (two) times daily.         Marland Kitchen HYDROcodone-acetaminophen (NORCO/VICODIN) 5-325 MG tablet      Limit 1 tab by mouth 2-3 times per day if tolerated   80 tablet   0   . ipratropium (ATROVENT) 0.02 % nebulizer solution   Nebulization   Take 0.5 mg by nebulization 4 (four) times daily as needed.          Marland Kitchen letrozole (FEMARA) 2.5 MG tablet   Oral   Take 1 tablet (2.5 mg total) by mouth daily.   90 tablet   0   . levothyroxine (SYNTHROID, LEVOTHROID) 25 MCG tablet   Oral   Take 25 mcg by mouth daily before breakfast.         . meloxicam (MOBIC) 15 MG tablet   Oral   Take 15 mg by mouth as needed for pain.         . metoprolol (LOPRESSOR) 50 MG tablet   Oral   Take 50 mg by mouth 2 (two) times daily.         Marland Kitchen oxyCODONE-acetaminophen (ROXICET) 5-325 MG tablet   Oral   Take 1-2 tablets by mouth every 6 (six) hours as needed for severe pain.   25 tablet   0   . predniSONE (DELTASONE) 10 MG tablet   Oral   Take 1 tablet (10 mg total) by mouth daily. 6,5,4,3,2,1 six day taper Patient not  taking: Reported on 09/23/2015   21 tablet   0   . predniSONE (DELTASONE) 20 MG tablet   Oral   Take 2 tablets (40 mg total) by mouth daily with breakfast. Patient not taking: Reported on 09/23/2015   10 tablet   0   . tiZANidine (ZANAFLEX) 2 MG tablet   Oral   Take by mouth 3 (three) times daily. prn for muscle spasm         . valACYclovir (VALTREX) 1000 MG tablet   Oral   Take 1 tablet (1,000 mg total) by mouth 3 (three) times daily.   30 tablet   0     Allergies Penicillins; Captopril; Enalapril maleate; Seldane ; Tape; Augmentin; and Biaxin  Family History  Problem Relation Age of Onset  . Hypertension Mother   . Heart disease Mother   . Cancer Mother   . Stroke Father   . Hypertension Father     Social History Social History  Substance Use Topics  . Smoking status: Never Smoker   . Smokeless tobacco: None  . Alcohol Use: No     Review of Systems Constitutional: No fever/chills, fatigue Eyes: No visual changes, redness, pain, discharge Cardiovascular: No chest pain. Respiratory: No shortness of breath. No wheezing.  Gastrointestinal: No abdominal pain.  No nausea, vomiting.   Musculoskeletal: Positive for back pain.  Skin: Positive rash left lower back with itching and pain. Neurological: Negative for headaches, focal weakness or numbness. 10-point ROS otherwise negative.  ____________________________________________   PHYSICAL EXAM:  VITAL SIGNS: ED Triage Vitals  Enc Vitals Group     BP 11/01/15 1319 159/90 mmHg     Pulse Rate 11/01/15 1319 66     Resp 11/01/15 1319 20     Temp 11/01/15 1319 89 F (31.7 C)     Temp Source 11/01/15 1319 Oral     SpO2 11/01/15 1319 97 %     Weight 11/01/15 1319 180 lb (81.647 kg)     Height  11/01/15 1319 5\' 1"  (1.549 m)     Head Cir --      Peak Flow --      Pain Score 11/01/15 1320 7     Pain Loc --      Pain Edu? --      Excl. in [place]? --     Constitutional: Alert and oriented. Well appearing and in no  acute distress. Eyes: Conjunctivae are normal. PERRL. EOMI without pain.  Head: Atraumatic. Cardiovascular: Normal rate, regular rhythm. Normal S1 and S2.  Good peripheral circulation. Respiratory: Normal respiratory effort without tachypnea or retractions. Lungs CTAB. Gastrointestinal: Soft and nontender. No distention, guarding. No CVA tenderness. Neurologic:  Normal speech and language. No gross focal neurologic deficits are appreciated.  Skin:  3 fascicular lesions on an erythematous patch noted about the left lower back stating approximately 1 cm annular. Left lower flank with 5-6 erythematous, flat, nonblanching lesions. Rash is in a dermatomal pattern. Skin is warm, dry and intact. Psychiatric: Mood and affect are normal. Speech and behavior are normal. Patient exhibits appropriate insight and judgement.   ____________________________________________   LABS  None  ____________________________________________  EKG  None ____________________________________________  RADIOLOGY  None ____________________________________________    PROCEDURES  Procedure(s) performed: None    Medications - No data to display   ____________________________________________   INITIAL IMPRESSION / ASSESSMENT AND PLAN / ED COURSE  Patient's diagnosis is consistent with shingles. Patient will be discharged home with prescriptions for Valtrex to take as directed. May continue chronic pain medications as prescribed by pain management physician. Patient advised to follow given home care instructions and follow-up with her primary care provider on Monday for recheck. Patient is given ED precautions to return to the ED for any worsening or new symptoms.    ____________________________________________  FINAL CLINICAL IMPRESSION(S) / ED DIAGNOSES  Final diagnoses:  Shingles      NEW MEDICATIONS STARTED DURING THIS VISIT:  New Prescriptions   VALACYCLOVIR (VALTREX) 1000 MG TABLET     Take 1 tablet (1,000 mg total) by mouth 3 (three) times daily.         Braxton Feathers, PA-C 11/01/15 1417  Earleen Newport, MD 11/01/15 1430

## 2015-11-01 NOTE — ED Notes (Signed)
Developed a small area of rash to left lower back positive itching and painful.

## 2015-11-01 NOTE — Discharge Instructions (Signed)

## 2015-11-11 ENCOUNTER — Telehealth: Payer: Self-pay | Admitting: Pain Medicine

## 2015-11-11 NOTE — Telephone Encounter (Signed)
Has Shingles on right sided hip and having a lot of pain, would like to know if there is anything dr crisp can do for her hip pain ? Please call patient

## 2015-11-11 NOTE — Telephone Encounter (Signed)
Janice Perkins,  and Janice Perkins Please schedule patient for Lumbosacral Selective Nerve Root Block for  Wednesday, 11/13/2015 7 - 7:30 AM appt

## 2015-11-13 ENCOUNTER — Encounter: Payer: Self-pay | Admitting: Pain Medicine

## 2015-11-13 ENCOUNTER — Ambulatory Visit: Payer: Medicare Other | Attending: Pain Medicine | Admitting: Pain Medicine

## 2015-11-13 VITALS — BP 130/93 | HR 70 | Temp 98.1°F | Resp 15 | Ht 61.0 in | Wt 180.0 lb

## 2015-11-13 DIAGNOSIS — M47817 Spondylosis without myelopathy or radiculopathy, lumbosacral region: Secondary | ICD-10-CM

## 2015-11-13 DIAGNOSIS — Z8619 Personal history of other infectious and parasitic diseases: Secondary | ICD-10-CM | POA: Insufficient documentation

## 2015-11-13 DIAGNOSIS — M79605 Pain in left leg: Secondary | ICD-10-CM | POA: Insufficient documentation

## 2015-11-13 DIAGNOSIS — B029 Zoster without complications: Secondary | ICD-10-CM

## 2015-11-13 DIAGNOSIS — M7060 Trochanteric bursitis, unspecified hip: Secondary | ICD-10-CM

## 2015-11-13 DIAGNOSIS — M79604 Pain in right leg: Secondary | ICD-10-CM | POA: Diagnosis present

## 2015-11-13 DIAGNOSIS — M533 Sacrococcygeal disorders, not elsewhere classified: Secondary | ICD-10-CM

## 2015-11-13 DIAGNOSIS — M7551 Bursitis of right shoulder: Secondary | ICD-10-CM

## 2015-11-13 DIAGNOSIS — S32000A Wedge compression fracture of unspecified lumbar vertebra, initial encounter for closed fracture: Secondary | ICD-10-CM

## 2015-11-13 DIAGNOSIS — M5136 Other intervertebral disc degeneration, lumbar region: Secondary | ICD-10-CM

## 2015-11-13 DIAGNOSIS — M5481 Occipital neuralgia: Secondary | ICD-10-CM

## 2015-11-13 DIAGNOSIS — M19011 Primary osteoarthritis, right shoulder: Secondary | ICD-10-CM

## 2015-11-13 MED ORDER — LACTATED RINGERS IV SOLN: 1000.0000 mL | INTRAVENOUS | Status: DC

## 2015-11-13 MED ORDER — BUPIVACAINE HCL (PF) 0.25 % IJ SOLN
INTRAMUSCULAR | Status: AC
  Administered 2015-11-13: 30 mL
  Filled 2015-11-13: qty 30

## 2015-11-13 MED ORDER — TRIAMCINOLONE ACETONIDE 40 MG/ML IJ SUSP
INTRAMUSCULAR | Status: AC
  Filled 2015-11-13: qty 1

## 2015-11-13 MED ORDER — FENTANYL CITRATE (PF) 100 MCG/2ML IJ SOLN
INTRAMUSCULAR | Status: AC
  Administered 2015-11-13: 50 ug via INTRAVENOUS
  Filled 2015-11-13: qty 2

## 2015-11-13 MED ORDER — CIPROFLOXACIN IN D5W 400 MG/200ML IV SOLN
400.0000 mg | Freq: Once | INTRAVENOUS | Status: AC
  Administered 2015-11-13: 400 mg via INTRAVENOUS

## 2015-11-13 MED ORDER — LIDOCAINE HCL (PF) 1 % IJ SOLN
INTRAMUSCULAR | Status: AC
  Administered 2015-11-13: 4 mL via SUBCUTANEOUS
  Filled 2015-11-13: qty 5

## 2015-11-13 MED ORDER — LIDOCAINE HCL (PF) 1 % IJ SOLN
10.0000 mL | Freq: Once | INTRAMUSCULAR | Status: AC
  Administered 2015-11-13: 4 mL via SUBCUTANEOUS

## 2015-11-13 MED ORDER — CIPROFLOXACIN HCL 250 MG PO TABS: 250.0000 mg | ORAL_TABLET | Freq: Two times a day (BID) | ORAL | Status: DC

## 2015-11-13 MED ORDER — BUPIVACAINE HCL (PF) 0.25 % IJ SOLN
30.0000 mL | Freq: Once | INTRAMUSCULAR | Status: AC
  Administered 2015-11-13: 30 mL

## 2015-11-13 MED ORDER — ORPHENADRINE CITRATE 30 MG/ML IJ SOLN
INTRAMUSCULAR | Status: AC
  Filled 2015-11-13: qty 2

## 2015-11-13 MED ORDER — ORPHENADRINE CITRATE 30 MG/ML IJ SOLN: 60.0000 mg | Freq: Once | INTRAMUSCULAR | Status: DC

## 2015-11-13 MED ORDER — TRIAMCINOLONE ACETONIDE 40 MG/ML IJ SUSP: 40.0000 mg | Freq: Once | INTRAMUSCULAR | Status: DC

## 2015-11-13 MED ORDER — CIPROFLOXACIN IN D5W 400 MG/200ML IV SOLN
INTRAVENOUS | Status: AC
  Administered 2015-11-13: 400 mg via INTRAVENOUS
  Filled 2015-11-13: qty 200

## 2015-11-13 MED ORDER — MIDAZOLAM HCL 5 MG/5ML IJ SOLN
5.0000 mg | Freq: Once | INTRAMUSCULAR | Status: AC
  Administered 2015-11-13: 3 mg via INTRAVENOUS

## 2015-11-13 MED ORDER — FENTANYL CITRATE (PF) 100 MCG/2ML IJ SOLN
100.0000 ug | Freq: Once | INTRAMUSCULAR | Status: AC
  Administered 2015-11-13: 50 ug via INTRAVENOUS

## 2015-11-13 MED ORDER — MIDAZOLAM HCL 5 MG/5ML IJ SOLN
INTRAMUSCULAR | Status: AC
  Administered 2015-11-13: 3 mg via INTRAVENOUS
  Filled 2015-11-13: qty 5

## 2015-11-13 NOTE — Patient Instructions (Addendum)
PLAN   Continue present medication hydrocodone acetaminophen Please get Cipro antibiotic today and begin taking Cipro antibiotic today   F/U PCP Dr.Hande  for evaliation of  BP and general medical  condition. See Dr.Hande this week regarding blood pressure shingles and general medical condition as we discussed. The pressure was elevated on some readings during today's visit   F/U surgical evaluation. May consider pending follow-up evaluations  F/U  Dr Rudene Christians  F/U neurological evaluation. May consider PNCV/EMG studies and other studies pending follow-up evaluations  May consider radiofrequency rhizolysis or intraspinal procedures pending response to present treatment and F/U evaluation   Patient to call Pain Management Center should patient have concerns prior to scheduled return appointment.Pain Management Discharge Instructions  General Discharge Instructions :  If you need to reach your doctor call: Monday-Friday 8:00 am - 4:00 pm at [phone] or toll free [phone].  After clinic hours [phone] to have operator reach doctor.  Bring all of your medication bottles to all your appointments in the pain clinic.  To cancel or reschedule your appointment with Pain Management please remember to call 24 hours in advance to avoid a fee.  Refer to the educational materials which you have been given on: General Risks, I had my Procedure. Discharge Instructions, Post Sedation.  Post Procedure Instructions:  The drugs you were given will stay in your system until tomorrow, so for the next 24 hours you should not drive, make any legal decisions or drink any alcoholic beverages.  You may eat anything you prefer, but it is better to start with liquids then soups and crackers, and gradually work up to solid foods.  Please notify your doctor immediately if you have any unusual bleeding, trouble breathing or pain that is not related to your normal pain.  Depending on the type of procedure  that was done, some parts of your body may feel week and/or numb.  This usually clears up by tonight or the next day.  Walk with the use of an assistive device or accompanied by an adult for the 24 hours.  You may use ice on the affected area for the first 24 hours.  Put ice in a Ziploc bag and cover with a towel and place against area 15 minutes on 15 minutes off.  You may switch to heat after 24 hours.

## 2015-11-13 NOTE — Progress Notes (Signed)
Safety precautions to be maintained throughout the outpatient stay will include: orient to surroundings, keep bed in low position, maintain call bell within reach at all times, provide assistance with transfer out of bed and ambulation.  

## 2015-11-13 NOTE — Progress Notes (Signed)
Subjective:    Patient ID: Janice Perkins, female    DOB: [birth year], 82 y.o.   MRN: [id number]  HPI  PROCEDURE PERFORMED: Lumbar sympathetic block.  HISTORY OF PRESENT ILLNESS: The patient is 82 y.o. female who returns to [place] for further evaluation and treatment of pain involving the lower extremities. The patient is with history of Shingles involving the left buttocks and lower extremity region with pain described as burning throbbing shooting and unbearable . There is concern regarding the patient's pain being due to shingles . The risks, benefits, and expectations of the procedure were discussed and explained to the patient who was understanding and wished to proceed with interventional treatment as planned.   DESCRIPTION OF PROCEDURE: Lumbar sympathetic block with IV Versed, IV fentanyl conscious sedation, EKG, blood pressure, pulse, pulse oximetry monitoring. The procedure was performed with the patient in prone position under fluoroscopic guidance.   NEEDLE PLACEMENT AT L2, left side lumbar sympathetic block: With the patient in prone position and oblique orientation of 20 degrees, Betadine prep and local anesthetic skin wheal of 1.5% lidocaine plain was prepared at the proposed needle entry site. Under fluoroscopic guidance with 20 degrees oblique orientation, the 22 -gauge needle was inserted at the lateral border of the L2 vertebral body on the left side.   NEEDLE PLACEMENT AT L3, left side lumbar sympathetic block: With the patient in the prone position and oblique orientation of 20 degrees, Betadine prep and local anesthetic skin wheal of 1.5% lidocaine plain was prepared at the proposed needle entry site. Under fluoroscopic guidance with 20 degrees  oblique orientation, the 22 -gauge needle was inserted at the lateral border of the L3 vertebral body on the left side.   NEEDLE PLACEMENT AT L4, left side lumbar sympathetic block: With the patient in prone position and  oblique orientation of 20 degrees, Betadine prep and local anesthetic skin wheal of 1.5% lidocaine plain was prepared at the proposed needle entry site. Under fluoroscopic guidance with 20 degrees  oblique orientation, the 22 -gauge needle was inserted at the lateral border of the L4 vertebral body on the left side.    Following needle placement at the L2, L3 and L4 vertebral body levels on the left side, needle placement was then verified on lateral view with tip of the needle documented to be in the anterior third of the vertebral body of L2, L3 and L4 respectively. Following negative aspiration of each needle for heme and CSF, L2 vertebral body level needle was injected with 10 mL of 0.25% bupivacaine. L3 vertebral body level needle was injected with 10 mL of 0.25% bupivacaine. L4 vertebral body level was injected with 10 mL of 0.25% bupivacaine. Needles were removed. Please note temperature readings prior to lumbar sympathetic block were noted to be 82 degrees Fahrenheit and following completion of the lumbar sympathetic block temperature readings of the lower extremity were noted to be 83 degrees Fahrenheit. The patient tolerated the procedure well.  PLAN:   1. Medications: We will continue presently prescribed medication hydrocodone acetaminophen at this time. 2. The patient is to follow-up with primary care physician Dr.Hande for further evaluation of blood pressure and general medical condition as discussed. 3. Surgical evaluation as discussed.. We we will avoid surgical evaluation at this time  4. Neurological evaluation as discussed.May consider PNCV EMG studies and other studies pending follow-up evaluations  5. The patient may be candidate for radiofrequency procedures, implantation devices, and other treatment pending response to treatment  and follow-up evaluation. 6. The patient has been advised to adhere to proper body mechanics. 7. The patient has been advised to call the Pain  Management Center prior to scheduled return appointment should there be significant change in condition or have other concerns regarding condition prior to scheduled return appointment.   The patient was understanding and in agreement with suggested treatment plan.   Review of Systems     Objective:   Physical Exam        Assessment & Plan:

## 2015-11-14 ENCOUNTER — Telehealth: Payer: Self-pay | Admitting: *Deleted

## 2015-11-14 NOTE — Telephone Encounter (Signed)
Attempted to reach patient, no answer, no voicemail capability 

## 2015-11-14 NOTE — Telephone Encounter (Signed)
No answer with post procedure follow-up call. 

## 2015-11-18 ENCOUNTER — Telehealth: Payer: Self-pay | Admitting: Pain Medicine

## 2015-11-18 NOTE — Telephone Encounter (Signed)
Hurting really bad, went to funeral on Sat and stood for one and half hours, has appt on thurs to discuss med changed but what can she do until then, please call patient to discuss

## 2015-11-18 NOTE — Telephone Encounter (Signed)
Pain is in the lower back, right shoulder and shoulder blade. Pain is not new, just worse after standing for 1 1/2 hours. Has appt on 11-21-15. States can wait til appt on 11-21-15.

## 2015-11-18 NOTE — Telephone Encounter (Signed)
Nurses I'll called and spoke with patient and she is happy to wait until Thursday 11/21/2015 to discuss her condition with me at that time

## 2015-11-21 ENCOUNTER — Encounter: Payer: Self-pay | Admitting: Pain Medicine

## 2015-11-21 ENCOUNTER — Ambulatory Visit: Payer: Medicare Other | Attending: Pain Medicine | Admitting: Pain Medicine

## 2015-11-21 VITALS — BP 144/76 | HR 64 | Temp 98.2°F | Resp 16 | Wt 181.0 lb

## 2015-11-21 DIAGNOSIS — M7551 Bursitis of right shoulder: Secondary | ICD-10-CM

## 2015-11-21 DIAGNOSIS — M533 Sacrococcygeal disorders, not elsewhere classified: Secondary | ICD-10-CM | POA: Insufficient documentation

## 2015-11-21 DIAGNOSIS — M7550 Bursitis of unspecified shoulder: Secondary | ICD-10-CM | POA: Insufficient documentation

## 2015-11-21 DIAGNOSIS — M792 Neuralgia and neuritis, unspecified: Secondary | ICD-10-CM | POA: Insufficient documentation

## 2015-11-21 DIAGNOSIS — M5136 Other intervertebral disc degeneration, lumbar region: Secondary | ICD-10-CM | POA: Diagnosis not present

## 2015-11-21 DIAGNOSIS — B029 Zoster without complications: Secondary | ICD-10-CM | POA: Diagnosis not present

## 2015-11-21 DIAGNOSIS — M47817 Spondylosis without myelopathy or radiculopathy, lumbosacral region: Secondary | ICD-10-CM

## 2015-11-21 DIAGNOSIS — M19019 Primary osteoarthritis, unspecified shoulder: Secondary | ICD-10-CM | POA: Insufficient documentation

## 2015-11-21 DIAGNOSIS — Z9889 Other specified postprocedural states: Secondary | ICD-10-CM | POA: Insufficient documentation

## 2015-11-21 DIAGNOSIS — M4316 Spondylolisthesis, lumbar region: Secondary | ICD-10-CM | POA: Diagnosis not present

## 2015-11-21 DIAGNOSIS — M5481 Occipital neuralgia: Secondary | ICD-10-CM

## 2015-11-21 DIAGNOSIS — M5126 Other intervertebral disc displacement, lumbar region: Secondary | ICD-10-CM | POA: Diagnosis not present

## 2015-11-21 DIAGNOSIS — S32000A Wedge compression fracture of unspecified lumbar vertebra, initial encounter for closed fracture: Secondary | ICD-10-CM

## 2015-11-21 DIAGNOSIS — M7060 Trochanteric bursitis, unspecified hip: Secondary | ICD-10-CM

## 2015-11-21 DIAGNOSIS — M79606 Pain in leg, unspecified: Secondary | ICD-10-CM | POA: Diagnosis present

## 2015-11-21 DIAGNOSIS — M545 Low back pain: Secondary | ICD-10-CM | POA: Diagnosis present

## 2015-11-21 DIAGNOSIS — M19011 Primary osteoarthritis, right shoulder: Secondary | ICD-10-CM

## 2015-11-21 MED ORDER — HYDROCODONE-ACETAMINOPHEN 5-325 MG PO TABS: ORAL_TABLET | ORAL | Status: DC

## 2015-11-21 NOTE — Progress Notes (Signed)
Safety precautions to be maintained throughout the outpatient stay will include: orient to surroundings, keep bed in low position, maintain call bell within reach at all times, provide assistance with transfer out of bed and ambulation.  

## 2015-11-21 NOTE — Patient Instructions (Addendum)
PLAN   Continue present medication hydrocodone acetaminophen.  Lumbosacral selective nerve root block to be performed at time return appointment  F/U PCP Dr.Hande  for evaliation of  BP shingles  and general medical  condition  F/U surgical evaluation. May consider pending follow-up evaluations  F/U neurological evaluation. May consider PNCV/EMG studies and other studies pending follow-up evaluations  May consider radiofrequency rhizolysis or intraspinal procedures pending response to present treatment and F/U evaluation   Patient to call pain management prior to scheduled return appointment for any concerSnselective Nerve Root Block Patient Information  Description: Specific nerve roots exit the spinal canal and these nerves can be compressed and inflamed by a bulging disc and bone spurs.  By injecting steroids on the nerve root, we can potentially decrease the inflammation surrounding these nerves, which often leads to decreased pain.  Also, by injecting local anesthesia on the nerve root, this can provide [place] helpful information to give to your referring doctor if it decreases your pain.  Selective nerve root blocks can be done along the spine from the neck to the low back depending on the location of your pain.   After numbing the skin with local anesthesia, a small needle is passed to the nerve root and the position of the needle is verified using x-ray pictures.  After the needle is in correct position, we then deposit the medication.  You may experience a pressure sensation while this is being done.  The entire block usually lasts less than 15 minutes.  Conditions that may be treated with selective nerve root blocks:  Low back and leg pain  Spinal stenosis  Diagnostic block prior to potential surgery  Neck and arm pain  Post laminectomy syndrome  Preparation for the injection:  1. Do not eat any solid food or dairy products within 8 hours of your appointment. 2. You may  drink clear liquids up to 3 hours before an appointment.  Clear liquids include water, black coffee, juice or soda.  No milk or cream please. 3. You may take your regular medications, including pain medications, with a sip of water before your appointment.  Diabetics should hold regular insulin (if taken separately) and take 1/2 normal NPH dose the morning of the procedure.  Carry some sugar containing items with you to your appointment. 4. A driver must accompany you and be prepared to drive you home after your procedure. 5. Bring all your current medications with you. 6. An IV may be inserted and sedation may be given at the discretion of the physician. 7. A blood pressure cuff, EKG, and other monitors will often be applied during the procedure.  Some patients may need to have extra oxygen administered for a short period. 8. You will be asked to provide medical information, including allergies, prior to the procedure.  We must know immediately if you are taking blood  Thinners (like Coumadin) or if you are allergic to IV iodine contrast (dye).  Possible side-effects: All are usually temporary  Bleeding from needle site  Light headedness  Numbness and tingling  Decreased blood pressure  Weakness in arms/legs  Pressure sensation in back/neck  Pain at injection site (several days)  Possible complications: All are extremely rare  Infection  Nerve injury  Spinal headache (a headache wore with upright position)  Call if you experience:  Fever/chills associated with headache or increased back/neck pain  Headache worsened by an upright position  New onset weakness or numbness of an extremity below the injection  site  Hives or difficulty breathing (go to the emergency room)  Inflammation or drainage at the injection site(s)  Severe back/neck pain greater than usual  New symptoms which are concerning to you  Please note:  Although the local anesthetic injected can often  make your back or neck feel good for several hours after the injection the pain will likely return.  It takes 3-5 days for steroids to work on the nerve root. You may not notice any pain relief for at least one week.  If effective, we will often do a series of 3 injections spaced 3-6 weeks apart to maximally decrease your pain.    If you have any questions, please call [phone]  [facility] R[facility] AND COM[facility]S  What are the risk, side effects and possible complications? Generally speaking, most procedures are safe.  However, with any procedure there are risks, side effects, and the possibility of complications.  The risks and complications are dependent upon the sites that are lesioned, or the type of nerve block to be performed.  The closer the procedure is to the spine, the more serious the risks are.  Great care is taken when placing the radio frequency needles, block needles or lesioning probes, but sometimes complications can occur. 1. Infection: Any time there is an injection through the skin, there is a risk of infection.  This is why sterile conditions are used for these blocks.  There are four possible types of infection. 1. Localized skin infection. 2. Central Nervous System Infection-This can be in the form of Meningitis, which can be deadly. 3. Epidural Infections-This can be in the form of an epidural abscess, which can cause pressure inside of the spine, causing compression of the spinal cord with subsequent paralysis. This would require an emergency surgery to decompress, and there are no guarantees that the patient would recover from the paralysis. 4. Discitis-This is an infection of the intervertebral discs.  It occurs in about 1% of discography procedures.  It is difficult to treat and it may lead to surgery.        2. Pain: the needles have to go through skin and soft tissues, will cause soreness.       3. Damage to internal  structures:  The nerves to be lesioned may be near blood vessels or    other nerves which can be potentially damaged.       4. Bleeding: Bleeding is more common if the patient is taking blood thinners such as  aspirin, Coumadin, Ticiid, Plavix, etc., or if he/she have some genetic predisposition  such as hemophilia. Bleeding into the spinal canal can cause compression of the spinal  cord with subsequent paralysis.  This would require an emergency surgery to  decompress and there are no guarantees that the patient would recover from the  paralysis.       5. Pneumothorax:  Puncturing of a lung is a possibility, every time a needle is introduced in  the area of the chest or upper back.  Pneumothorax refers to free air around the  collapsed lung(s), inside of the thoracic cavity (chest cavity).  Another two possible  complications related to a similar event would include: Hemothorax and Chylothorax.   These are variations of the Pneumothorax, where instead of air around the collapsed  lung(s), you may have blood or chyle, respectively.       6. Spinal headaches: They may occur with any procedures in the area of the spine.  7. Persistent CSF (Cerebro-Spinal Fluid) leakage: This is a rare problem, but may occur  with prolonged intrathecal or epidural catheters either due to the formation of a fistulous  track or a dural tear.       8. Nerve damage: By working so close to the spinal cord, there is always a possibility of  nerve damage, which could be as serious as a permanent spinal cord injury with  paralysis.       9. Death:  Although rare, severe deadly allergic reactions known as "Anaphylactic  reaction" can occur to any of the medications used.      10. Worsening of the symptoms:  We can always make thing worse.  What are the chances of something like this happening? Chances of any of this occuring are extremely low.  By statistics, you have more of a chance of getting killed in a motor vehicle  accident: while driving to the hospital than any of the above occurring .  Nevertheless, you should be aware that they are possibilities.  In general, it is similar to taking a shower.  Everybody knows that you can slip, hit your head and get killed.  Does that mean that you should not shower again?  Nevertheless always keep in mind that statistics do not mean anything if you happen to be on the wrong side of them.  Even if a procedure has a 1 (one) in a 1,000,000 (million) chance of going wrong, it you happen to be that one..Also, keep in mind that by statistics, you have more of a chance of having something go wrong when taking medications.  Who should not have this procedure? If you are on a blood thinning medication (e.g. Coumadin, Plavix, see list of "Blood Thinners"), or if you have an active infection going on, you should not have the procedure.  If you are taking any blood thinners, please inform your physician.  How should I prepare for this procedure?  Do not eat or drink anything at least six hours prior to the procedure.  Bring a driver with you .  It cannot be a taxi.  Come accompanied by an adult that can drive you back, and that is strong enough to help you if your legs get weak or numb from the local anesthetic.  Take all of your medicines the morning of the procedure with just enough water to swallow them.  If you have diabetes, make sure that you are scheduled to have your procedure done first thing in the morning, whenever possible.  If you have diabetes, take only half of your insulin dose and notify our nurse that you have done so as soon as you arrive at the clinic.  If you are diabetic, but only take blood sugar pills (oral hypoglycemic), then do not take them on the morning of your procedure.  You may take them after you have had the procedure.  Do not take aspirin or any aspirin-containing medications, at least eleven (11) days prior to the procedure.  They may prolong  bleeding.  Wear loose fitting clothing that may be easy to take off and that you would not mind if it got stained with Betadine or blood.  Do not wear any jewelry or perfume  Remove any nail coloring.  It will interfere with some of our monitoring equipment.  NOTE: Remember that this is not meant to be interpreted as a complete list of all possible complications.  Unforeseen problems may occur.  BLOOD THINNERS  The following drugs contain aspirin or other products, which can cause increased bleeding during surgery and should not be taken for 2 weeks prior to and 1 week after surgery.  If you should need take something for relief of minor pain, you may take acetaminophen which is found in Tylenol,m Datril, Anacin-3 and Panadol. It is not blood thinner. The products listed below are.  Do not take any of the products listed below in addition to any listed on your instruction sheet.  A.P.C or A.P.C with Codeine Codeine Phosphate Capsules #3 Ibuprofen Ridaura  ABC compound Congesprin Imuran rimadil  Advil Cope Indocin Robaxisal  Alka-Seltzer Effervescent Pain Reliever and Antacid Coricidin or Coricidin-D  Indomethacin Rufen  Alka-Seltzer plus Cold Medicine Cosprin Ketoprofen S-A-C Tablets  Anacin Analgesic Tablets or Capsules Coumadin Korlgesic Salflex  Anacin Extra Strength Analgesic tablets or capsules CP-2 Tablets Lanoril Salicylate  Anaprox Cuprimine Capsules Levenox Salocol  Anexsia-D Dalteparin Magan Salsalate  Anodynos Darvon compound Magnesium Salicylate Sine-off  Ansaid Dasin Capsules Magsal Sodium Salicylate  Anturane Depen Capsules Marnal Soma  APF Arthritis pain formula Dewitt's Pills Measurin Stanback  Argesic Dia-Gesic Meclofenamic Sulfinpyrazone  Arthritis Bayer Timed Release Aspirin Diclofenac Meclomen Sulindac  Arthritis pain formula Anacin Dicumarol Medipren Supac  Analgesic (Safety coated) Arthralgen Diffunasal Mefanamic Suprofen  Arthritis Strength Bufferin Dihydrocodeine  Mepro Compound Suprol  Arthropan liquid Dopirydamole Methcarbomol with Aspirin Synalgos  ASA tablets/Enseals Disalcid Micrainin Tagament  Ascriptin Doan's Midol Talwin  Ascriptin A/D Dolene Mobidin Tanderil  Ascriptin Extra Strength Dolobid Moblgesic Ticlid  Ascriptin with Codeine Doloprin or Doloprin with Codeine Momentum Tolectin  Asperbuf Duoprin Mono-gesic Trendar  Aspergum Duradyne Motrin or Motrin IB Triminicin  Aspirin plain, buffered or enteric coated Durasal Myochrisine Trigesic  Aspirin Suppositories Easprin Nalfon Trillsate  Aspirin with Codeine Ecotrin Regular or Extra Strength Naprosyn Uracel  Atromid-S Efficin Naproxen Ursinus  Auranofin Capsules Elmiron Neocylate Vanquish  Axotal Emagrin Norgesic Verin  Azathioprine Empirin or Empirin with Codeine Normiflo Vitamin E  Azolid Emprazil Nuprin Voltaren  Bayer Aspirin plain, buffered or children's or timed BC Tablets or powders Encaprin Orgaran Warfarin Sodium  Buff-a-Comp Enoxaparin Orudis Zorpin  Buff-a-Comp with Codeine Equegesic Os-Cal-Gesic   Buffaprin Excedrin plain, buffered or Extra Strength Oxalid   Bufferin Arthritis Strength Feldene Oxphenbutazone   Bufferin plain or Extra Strength Feldene Capsules Oxycodone with Aspirin   Bufferin with Codeine Fenoprofen Fenoprofen Pabalate or Pabalate-SF   Buffets II Flogesic Panagesic   Buffinol plain or Extra Strength Florinal or Florinal with Codeine Panwarfarin   Buf-Tabs Flurbiprofen Penicillamine   Butalbital Compound Four-way cold tablets Penicillin   Butazolidin Fragmin Pepto-Bismol   Carbenicillin Geminisyn Percodan   Carna Arthritis Reliever Geopen Persantine   Carprofen Gold's salt Persistin   Chloramphenicol Goody's Phenylbutazone   Chloromycetin Haltrain Piroxlcam   Clmetidine heparin Plaquenil   Cllnoril Hyco-pap Ponstel   Clofibrate Hydroxy chloroquine Propoxyphen         Before stopping any of these medications, be sure to consult the physician who ordered  them.  Some, such as Coumadin (Warfarin) are ordered to prevent or treat serious conditions such as "deep thrombosis", "pumonary embolisms", and other heart problems.  The amount of time that you may need off of the medication may also vary with the medication and the reason for which you were taking it.  If you are taking any of these medications, please make sure you notify your pain physician before you undergo any procedures.

## 2015-11-21 NOTE — Progress Notes (Signed)
Subjective:    Patient ID: Janice Perkins, female    DOB: [birth year], 82 y.o.   MRN: [id number]  HPI   The patient is an 82 year old female who returns to pain management for further evaluation and treatment of pain involving the lower back lower extremity regions pressure the region of the left buttocks and lower extremity region. The patient is with shingles with burning stinging throbbing pain occurring in the left lower extremity predominantly patient also is with pain of the lumbar lower extremity region with pain involving the neck and upper extremity region is well. We discussed patient's condition and decision was made to consider patient for interventional treatment consisting of lumbosacral selective nerve root block at time return appointment in attempt to decrease the pain of the shingles and to decrease the tenderness to palpation of the gluteal and piriformis musculature regions. The patient denied any trauma change in events of daily living the call significant change in symptomatology. The patient stated the pain of the lumbar region radiating to the buttocks on the left predominantly and was with burning stinging quality. The patient denied any trauma change in events of daily living the call significant change in symptomatology       Review of Systems     Objective:   Physical Exam  There was tenderness to palpation of the splenius capitate and occipitalis musculature region of mild degree with mild tenderness of the cervical facet cervical paraspinal musculature region. Palpation of the acromioclavicular and glenohumeral joint region was with mild to moderate discomfort and patient appeared to be with slightly decreased grip strength. Tinel and Phalen's maneuver were without increase of pain of significant degree. There was no crepitus of the thoracic region noted. Palpation over the lumbar paraspinal musculature region lumbar facet region was with tenderness to palpation of  moderate degree. There was tenderness to palpation of the gluteal and piriformis musculature region on the left of severe degree. Straight leg raising was tolerates approximately 20 without a definite increased pain with dorsiflexion noted. There was negative clonus negative Homans. Palpation of the PSIS and PII S regions were with tenderness to palpation of moderate degree. There was evidence of lesions of the gluteal musculature region on the left palpation of the associated with moderate to severe discomfort. Straight leg raising was limited to 20 without a definite increase of pain with dorsiflexion noted. There was negative clonus negative Homans. Abdomen was attends to palpation on the left in the lower quadrant. No costovertebral tenderness was noted.      Assessment & Plan:   Shingles (left lumbosacral region)  Sacroiliac joint dysfunction  Degenerative joint disease of shoulder  Degenerative disc disease lumbar spine Suspected acute compression fracture at L3, superiorly on the LEFT. No retropulsion.  Moderate multifactorial spinal stenosis is suspected at L2-3 and L3-4. Previous lumbar surgery at L4-5 with adequate posterior decompression. Incomplete arthrodesis with 4 mm anterolisthesis. Probable solid arthrodesis at L5-S1 with BILATERAL foraminal  narrowing Moderate involvement L3-4 and L4-L5 with broad-based disc bulge and facet arthropathy present at L2-3, L3-4, L4-5, and L5-S1Lumbar facet syndrome   Gluteal and piriformis neuralgia  Bursitis of shoulder  Degenerative joint disease of shoulder      PLAN   Continue present medication hydrocodone acetaminophen.  Lumbosacral selective nerve root block to be performed at time return appointment  F/U PCP Dr.Hande  for evaliation of  BP shingles  and general medical  condition  F/U surgical evaluation. May consider pending follow-up evaluations  F/U neurological evaluation. May consider PNCV/EMG studies and  other studies pending follow-up evaluations  May consider radiofrequency rhizolysis or intraspinal procedures pending response to present treatment and F/U evaluation   Patient to call pain management prior to scheduled return appointment for any concer

## 2015-12-03 ENCOUNTER — Telehealth: Payer: Self-pay

## 2015-12-03 ENCOUNTER — Telehealth: Payer: Self-pay | Admitting: *Deleted

## 2015-12-03 NOTE — Telephone Encounter (Signed)
Pt says it feels like she pulled a muscle in her neck from picking up groceries

## 2015-12-03 NOTE — Telephone Encounter (Signed)
Spoke with Janice Perkins, she will keep appt as scheduled for tomorrow.

## 2015-12-03 NOTE — Telephone Encounter (Signed)
Spoke with Mrs Mcbrayer and have gone over her neck issue with him and he will see patient in the morning and will evaluate her pain at that time and perform beneficial procedure.

## 2015-12-04 ENCOUNTER — Encounter: Payer: Self-pay | Admitting: Pain Medicine

## 2015-12-04 ENCOUNTER — Ambulatory Visit: Payer: Medicare Other | Attending: Pain Medicine | Admitting: Pain Medicine

## 2015-12-04 ENCOUNTER — Other Ambulatory Visit: Payer: Medicare Other

## 2015-12-04 ENCOUNTER — Ambulatory Visit: Payer: Medicare Other

## 2015-12-04 VITALS — BP 178/84 | HR 76 | Temp 97.6°F | Resp 18 | Ht 61.0 in | Wt 179.0 lb

## 2015-12-04 DIAGNOSIS — M47817 Spondylosis without myelopathy or radiculopathy, lumbosacral region: Secondary | ICD-10-CM

## 2015-12-04 DIAGNOSIS — S32000A Wedge compression fracture of unspecified lumbar vertebra, initial encounter for closed fracture: Secondary | ICD-10-CM

## 2015-12-04 DIAGNOSIS — M1288 Other specific arthropathies, not elsewhere classified, other specified site: Secondary | ICD-10-CM | POA: Insufficient documentation

## 2015-12-04 DIAGNOSIS — M533 Sacrococcygeal disorders, not elsewhere classified: Secondary | ICD-10-CM

## 2015-12-04 DIAGNOSIS — M7060 Trochanteric bursitis, unspecified hip: Secondary | ICD-10-CM

## 2015-12-04 DIAGNOSIS — Z9889 Other specified postprocedural states: Secondary | ICD-10-CM | POA: Diagnosis not present

## 2015-12-04 DIAGNOSIS — M79606 Pain in leg, unspecified: Secondary | ICD-10-CM | POA: Diagnosis present

## 2015-12-04 DIAGNOSIS — M7551 Bursitis of right shoulder: Secondary | ICD-10-CM

## 2015-12-04 DIAGNOSIS — M5481 Occipital neuralgia: Secondary | ICD-10-CM

## 2015-12-04 DIAGNOSIS — M5136 Other intervertebral disc degeneration, lumbar region: Secondary | ICD-10-CM

## 2015-12-04 DIAGNOSIS — M19011 Primary osteoarthritis, right shoulder: Secondary | ICD-10-CM

## 2015-12-04 DIAGNOSIS — M545 Low back pain: Secondary | ICD-10-CM | POA: Diagnosis present

## 2015-12-04 DIAGNOSIS — B029 Zoster without complications: Secondary | ICD-10-CM

## 2015-12-04 MED ORDER — LACTATED RINGERS IV SOLN
1000.0000 mL | INTRAVENOUS | Status: DC
Start: 2015-12-04 — End: 2016-12-22

## 2015-12-04 MED ORDER — BUPIVACAINE HCL (PF) 0.25 % IJ SOLN
30.0000 mL | Freq: Once | INTRAMUSCULAR | Status: AC
  Administered 2015-12-04: 30 mL

## 2015-12-04 MED ORDER — TRIAMCINOLONE ACETONIDE 40 MG/ML IJ SUSP
40.0000 mg | Freq: Once | INTRAMUSCULAR | Status: AC
  Administered 2015-12-04: 40 mg

## 2015-12-04 MED ORDER — BUPIVACAINE HCL (PF) 0.25 % IJ SOLN
INTRAMUSCULAR | Status: AC
  Administered 2015-12-04: 30 mL
  Filled 2015-12-04: qty 30

## 2015-12-04 MED ORDER — ORPHENADRINE CITRATE 30 MG/ML IJ SOLN
INTRAMUSCULAR | Status: AC
  Administered 2015-12-04: 60 mg via INTRAMUSCULAR
  Filled 2015-12-04: qty 2

## 2015-12-04 MED ORDER — MIDAZOLAM HCL 5 MG/5ML IJ SOLN
INTRAMUSCULAR | Status: AC
  Filled 2015-12-04: qty 5

## 2015-12-04 MED ORDER — MIDAZOLAM HCL 5 MG/5ML IJ SOLN: 5.0000 mg | Freq: Once | INTRAMUSCULAR | Status: DC

## 2015-12-04 MED ORDER — CIPROFLOXACIN HCL 250 MG PO TABS: 250.0000 mg | ORAL_TABLET | Freq: Two times a day (BID) | ORAL | Status: DC

## 2015-12-04 MED ORDER — FENTANYL CITRATE (PF) 100 MCG/2ML IJ SOLN
INTRAMUSCULAR | Status: AC
  Administered 2015-12-04: 100 ug via INTRAVENOUS
  Filled 2015-12-04: qty 2

## 2015-12-04 MED ORDER — LIDOCAINE HCL (PF) 1 % IJ SOLN
INTRAMUSCULAR | Status: AC
  Administered 2015-12-04: 5 mL via SUBCUTANEOUS
  Filled 2015-12-04: qty 5

## 2015-12-04 MED ORDER — ORPHENADRINE CITRATE 30 MG/ML IJ SOLN
60.0000 mg | Freq: Once | INTRAMUSCULAR | Status: AC
  Administered 2015-12-04: 60 mg via INTRAMUSCULAR

## 2015-12-04 MED ORDER — FENTANYL CITRATE (PF) 100 MCG/2ML IJ SOLN
100.0000 ug | Freq: Once | INTRAMUSCULAR | Status: AC
  Administered 2015-12-04: 100 ug via INTRAVENOUS

## 2015-12-04 MED ORDER — CIPROFLOXACIN IN D5W 400 MG/200ML IV SOLN
400.0000 mg | Freq: Once | INTRAVENOUS | Status: AC
  Administered 2015-12-04: 400 mg via INTRAVENOUS

## 2015-12-04 MED ORDER — LIDOCAINE HCL (PF) 1 % IJ SOLN
10.0000 mL | Freq: Once | INTRAMUSCULAR | Status: AC
  Administered 2015-12-04: 5 mL via SUBCUTANEOUS

## 2015-12-04 MED ORDER — CIPROFLOXACIN IN D5W 400 MG/200ML IV SOLN
INTRAVENOUS | Status: AC
  Administered 2015-12-04: 400 mg via INTRAVENOUS
  Filled 2015-12-04: qty 200

## 2015-12-04 MED ORDER — TRIAMCINOLONE ACETONIDE 40 MG/ML IJ SUSP
INTRAMUSCULAR | Status: AC
  Administered 2015-12-04: 40 mg
  Filled 2015-12-04: qty 1

## 2015-12-04 NOTE — Progress Notes (Signed)
Safety precautions to be maintained throughout the outpatient stay will include: orient to surroundings, keep bed in low position, maintain call bell within reach at all times, provide assistance with transfer out of bed and ambulation.  

## 2015-12-04 NOTE — Patient Instructions (Addendum)
PLAN   Continue present medication hydrocodone acetaminophen Please get Cipro antibiotic today and begin taking Cipro antibiotic today   F/U PCP Dr.Hande  for evaliation of  BP and general medical  condition . The patient will follow-up with Dr. Marcelino Scot regarding blood pressure as discussed. Initial blood pressure was elevated and following blood pressures were within normal limits  F/U surgical evaluation. May consider pending follow-up evaluations  F/U  Dr Rudene Christians as discussed  F/U neurological evaluation. May consider PNCV/EMG studies and other studies pending follow-up evaluations  May consider radiofrequency rhizolysis or intraspinal procedures pending response to present treatment and F/U evaluation   Patient to call Pain Management Center should patient have concerns prior to scheduled return appointmentPain Management Discharge Instructions  General Discharge Instructions :  If you need to reach your doctor call: Monday-Friday 8:00 am - 4:00 pm at [phone] or toll free [phone].  After clinic hours [phone] to have operator reach doctor.  Bring all of your medication bottles to all your appointments in the pain clinic.  To cancel or reschedule your appointment with Pain Management please remember to call 24 hours in advance to avoid a fee.  Refer to the educational materials which you have been given on: General Risks, I had my Procedure. Discharge Instructions, Post Sedation.  Post Procedure Instructions:  The drugs you were given will stay in your system until tomorrow, so for the next 24 hours you should not drive, make any legal decisions or drink any alcoholic beverages.  You may eat anything you prefer, but it is better to start with liquids then soups and crackers, and gradually work up to solid foods.  Please notify your doctor immediately if you have any unusual bleeding, trouble breathing or pain that is not related to your normal pain.  Depending on the  type of procedure that was done, some parts of your body may feel week and/or numb.  This usually clears up by tonight or the next day.  Walk with the use of an assistive device or accompanied by an adult for the 24 hours.  You may use ice on the affected area for the first 24 hours.  Put ice in a Ziploc bag and cover with a towel and place against area 15 minutes on 15 minutes off.  You may switch to heat after 24 hours.

## 2015-12-04 NOTE — Progress Notes (Signed)
Subjective:    Patient ID: Janice Perkins, female    DOB: 1933-06-01, 82 y.o.   MRN: [id number]  HPI  PROCEDURE PERFORMED: Lumbosacral selective nerve root block   NOTE: The patient is a 82 y.o. female who returns to [place] for further evaluation and treatment of pain involving the lumbar and lower extremity region. Studies consisting of MRI revealed degenerative disc disease lumbar spine Suspected acute compression fracture at L3, superiorly on the LEFT. No retropulsion.Moderate multifactorial spinal stenosis is suspected at L2-3 and L3-4.Previous lumbar surgery at L4-5 with adequate posterior decompression. Incomplete arthrodesis with 4 mm anterolisthesis. Probable solid arthrodesis at L5-S1 with BILATERAL foraminal  narrowing Moderate involvement L3-4 and L4-L5 with broad-based disc bulge and facet arthropathy present at L2-3, L3-4, L4-5, and L5-S1. There is concern regarding patient pain being due to intraspinal abnormalities with concern regarding component of pain due to lumbar radiculopathy and lumbar stenosis The risks, benefits, and expectations of the procedure have been explained to the patient who was understanding and in agreement with suggested treatment plan. We will proceed with interventional treatment as discussed and as explained to the patient. The patient is understanding and in agreement with suggested treatment plan.   DESCRIPTION OF PROCEDURE: Lumbosacral selective nerve root block with IV Versed, IV fentanyl conscious sedation, EKG, blood pressure, pulse, capnography, and pulse oximetry monitoring. The procedure was performed with the patient in the prone position under fluoroscopic guidance. With the patient in the prone position, Betadine prep of proposed entry site was performed. Local anesthetic skin wheal of proposed needle entry site was prepared with 1.5% plain lidocaine with AP view of the lumbosacral spine.   PROCEDURE #1: Needle placement at the  left L 2 vertebral body: A 22 -gauge needle was inserted at the inferior border of the transverse process of the vertebral body with needle placed medial to the midline of the transverse process on AP view of the lumbosacral spine.   NEEDLE PLACEMENT AT  L3 and L4  VERTEBRAL BODY LEVELS  Needle  placement was accomplished at L3, and L4  vertebral body levels on the left side exactly as was accomplished at the L2  vertebral body level  and utilizing the same technique and under fluoroscopic guidance.  PROCEDURE #4: Needle placement at the S1 foramen. With the patient in the prone position with Betadine prep of proposed entry site accomplished, the S1 foramen was visualized under fluoroscopic guidance with AP view of the lumbosacral spine with cephalad orientation of the fluoroscope with local anesthetic skin wheal of 1.5% lidocaine of proposed needle entry site prepared. A 22-gauge needle was inserted S1 foramen under fluoroscopic guidance eliciting paresthesias radiating from the buttocks to the lower extremity after which needle was slightly withdrawn.   Needle placement was then verified on lateral view at all levels with needle tip documented to be in the posterior superior quadrant of the intervertebral foramen of  L 2, L3, and L4, and needle tip documented at the level of the S1 foramen. Following negative aspiration for heme and CSF at each level, each level was injected with 3 mL of 0.25% bupivacaine with Kenalog.  Myoneural block injections of the trapezius musculature region Following Betadine prep of proposed entry site a 22-gauge needle was inserted into trapezius musculature region on the right and following negative aspiration 2 cc of 0.25% bupivacaine with Norflex was injected for myoneural block injection of the trapezius musculature region times two.   The patient tolerated the procedure  well.   A total of 10 mg of Kenalog was utilized for the procedure.   PLAN:  1. Medications:  Will continue presently prescribed medication hydrocodone acetaminophen 2. The patient is to undergo follow-up evaluation with PCP  Hande  for evaluation of blood pressure and general medical condition status post procedure performed on today's visit. 3. Surgical follow-up evaluation. Has been addressed 4. Neurological evaluation.. May consider PNCV/EMG studies and other studies 5. May consider radiofrequency procedures, implantation type procedures and other treatment pending response to treatment and follow-up evaluation. 6. The patient has been advise do adhere to proper body mechanics and avoid activities which may aggravate condition. 7. The patient has been advised to call the [facility] prior to scheduled return appointment should there be significant change in the patient's condition or should the patient have other concerns regarding condition prior to scheduled return appointment.   Review of Systems     Objective:   Physical Exam        Assessment & Plan:

## 2015-12-05 ENCOUNTER — Telehealth: Payer: Self-pay | Admitting: *Deleted

## 2015-12-05 NOTE — Telephone Encounter (Signed)
Spoke with patient denies any problems or concerns from procedure on yesterday.

## 2015-12-06 ENCOUNTER — Ambulatory Visit
Admission: RE | Admit: 2015-12-06 | Discharge: 2015-12-06 | Disposition: A | Discharge status: Home / Self Care | Payer: Medicare Other | Source: Ambulatory Visit | Attending: Oncology | Admitting: Oncology

## 2015-12-06 ENCOUNTER — Other Ambulatory Visit: Payer: Self-pay | Admitting: Oncology

## 2015-12-06 DIAGNOSIS — C50911 Malignant neoplasm of unspecified site of right female breast: Secondary | ICD-10-CM

## 2015-12-06 DIAGNOSIS — Z853 Personal history of malignant neoplasm of breast: Secondary | ICD-10-CM | POA: Diagnosis not present

## 2015-12-11 ENCOUNTER — Other Ambulatory Visit: Payer: Self-pay | Admitting: Surgery

## 2015-12-11 DIAGNOSIS — Z853 Personal history of malignant neoplasm of breast: Secondary | ICD-10-CM

## 2015-12-19 ENCOUNTER — Encounter: Payer: Self-pay | Admitting: Pain Medicine

## 2015-12-19 ENCOUNTER — Ambulatory Visit: Payer: Medicare Other | Attending: Pain Medicine | Admitting: Pain Medicine

## 2015-12-19 VITALS — BP 133/73 | HR 71 | Temp 98.2°F | Resp 16 | Wt 179.0 lb

## 2015-12-19 DIAGNOSIS — M47817 Spondylosis without myelopathy or radiculopathy, lumbosacral region: Secondary | ICD-10-CM

## 2015-12-19 DIAGNOSIS — M5481 Occipital neuralgia: Secondary | ICD-10-CM

## 2015-12-19 DIAGNOSIS — M5136 Other intervertebral disc degeneration, lumbar region: Secondary | ICD-10-CM | POA: Insufficient documentation

## 2015-12-19 DIAGNOSIS — M19019 Primary osteoarthritis, unspecified shoulder: Secondary | ICD-10-CM | POA: Insufficient documentation

## 2015-12-19 DIAGNOSIS — M7550 Bursitis of unspecified shoulder: Secondary | ICD-10-CM | POA: Diagnosis not present

## 2015-12-19 DIAGNOSIS — Z9889 Other specified postprocedural states: Secondary | ICD-10-CM | POA: Diagnosis not present

## 2015-12-19 DIAGNOSIS — B029 Zoster without complications: Secondary | ICD-10-CM | POA: Diagnosis not present

## 2015-12-19 DIAGNOSIS — M79606 Pain in leg, unspecified: Secondary | ICD-10-CM | POA: Diagnosis present

## 2015-12-19 DIAGNOSIS — M533 Sacrococcygeal disorders, not elsewhere classified: Secondary | ICD-10-CM

## 2015-12-19 DIAGNOSIS — M545 Low back pain: Secondary | ICD-10-CM | POA: Diagnosis present

## 2015-12-19 DIAGNOSIS — M5126 Other intervertebral disc displacement, lumbar region: Secondary | ICD-10-CM | POA: Diagnosis not present

## 2015-12-19 DIAGNOSIS — M792 Neuralgia and neuritis, unspecified: Secondary | ICD-10-CM | POA: Insufficient documentation

## 2015-12-19 DIAGNOSIS — M7060 Trochanteric bursitis, unspecified hip: Secondary | ICD-10-CM

## 2015-12-19 DIAGNOSIS — M7551 Bursitis of right shoulder: Secondary | ICD-10-CM

## 2015-12-19 DIAGNOSIS — S32000A Wedge compression fracture of unspecified lumbar vertebra, initial encounter for closed fracture: Secondary | ICD-10-CM

## 2015-12-19 DIAGNOSIS — M19011 Primary osteoarthritis, right shoulder: Secondary | ICD-10-CM

## 2015-12-19 MED ORDER — HYDROCODONE-ACETAMINOPHEN 5-325 MG PO TABS: ORAL_TABLET | ORAL | Status: DC

## 2015-12-19 NOTE — Patient Instructions (Signed)
PLAN   Continue present medication hydrocodone acetaminophen  F/U PCP Dr.Hande  for evaliation of  BP shingles  and general medical  condition  F/U surgical evaluation. May consider pending follow-up evaluations  F/U neurological evaluation. May consider PNCV/EMG studies and other studies pending follow-up evaluations  May consider radiofrequency rhizolysis or intraspinal procedures pending response to present treatment and F/U evaluation   Patient to call pain management prior to scheduled return appointment for any concern

## 2015-12-19 NOTE — Progress Notes (Signed)
Safety precautions to be maintained throughout the outpatient stay will include: orient to surroundings, keep bed in low position, maintain call bell within reach at all times, provide assistance with transfer out of bed and ambulation.  

## 2015-12-20 NOTE — Progress Notes (Signed)
Subjective:    Patient ID: Janice Perkins, female    DOB: April 19, 1933, 82 y.o.   MRN: [id number]  HPI  The patient is an 82 year old female who returns to pain management for further evaluation and treatment of pain involving the region of the lower back and lower extremity region predominantly the patient is pain has been felt to be due to intraspinal obstacle lumbar region as well as component of shingles involving the lumbar lower extremity region on the left. The patient has had pain involving the shoulders and states that pain present time is rather well-controlled at this time. We discussed patient's shingles of the lumbar lower extremity region and advised patient to let us know if there is return of any significant pain in the region of the shingles and that we will proceed with interventional treatment as discussed. The patient has had significant improvement following interventional treatment for shingles. The patient denies any trauma change in events of daily living of to cause any change in symptomatology. We will continue patient's hydrocodone acetaminophen at this time as discussed. All agreed to suggested treatment plan  Review of Systems     Objective:   Physical Exam  There was tenderness of the splenius capitis and occipitalis musculature regions of mild degree with mild to moderate tenderness over the cervical facet cervical paraspinal musculature region. Palpation of the acromioclavicular and glenohumeral joint regions reproduced pain of moderate degree and patient was with unremarkable Spurling's maneuver. Tinel and Phalen's maneuver were without increase of pain of significant degree and patient appeared to be with bilaterally equal grip strength. Palpation over the lumbar paraspinal must reason lumbar facet region was attends to palpation of moderate degree with lateral bending rotation extension and palpation of the lumbar facets reproducing moderate discomfort. Palpation over  the PSIS and PI is region was with moderate discomfort on the left and mild discomfort on the right. There was no evidence of new inform lesions of the lumbar lower extremity region on the left in the area of previous shingles lesions area straight leg raising was tolerates approximately 20 without increased pain with dorsiflexion noted. No definite sensory deficit or dermatomal distribution detected. EHL strength appeared to be decreased on the left compared to the right. DTRs were difficult to elicit. Patient had difficulty relaxing. There was tends to palpation of the greater trochanteric region iliotibial band region of moderate degree on the left and mild degree on the right. There was negative clonus negative Homans. Abdomen nontender with no costovertebral tenderness noted.      Assessment & Plan:     Shingles (left lumbosacral region)  Sacroiliac joint dysfunction  Degenerative joint disease of shoulder  Degenerative disc disease lumbar spine Suspected acute compression fracture at L3, superiorly on the LEFT. No retropulsion.  Moderate multifactorial spinal stenosis is suspected at L2-3 and L3-4. Previous lumbar surgery at L4-5 with adequate posterior decompression. Incomplete arthrodesis with 4 mm anterolisthesis. Probable solid arthrodesis at L5-S1 with BILATERAL foraminal  narrowing Moderate involvement L3-4 and L4-L5 with broad-based disc bulge and facet arthropathy present at L2-3, L3-4, L4-5, and L5-S1Lumbar facet syndrome   Gluteal and piriformis neuralgia  Bursitis of shoulder  Degenerative joint disease of shoulder       PLAN   Continue present medication hydrocodone acetaminophen  F/U PCP Dr.Hande  for evaliation of  BP shingles  and general medical  condition  F/U surgical evaluation. May consider pending follow-up evaluations  F/U neurological evaluation. May consider PNCV/EMG studies and  other studies pending follow-up evaluations  May consider  radiofrequency rhizolysis or intraspinal procedures pending response to present treatment and F/U evaluation   Patient to call pain management prior to scheduled return appointment for any concern

## 2015-12-26 LAB — TOXASSURE SELECT 13 (MW), URINE

## 2015-12-27 NOTE — Progress Notes (Signed)
Quick Note:  Reviewed. ______ 

## 2016-01-15 ENCOUNTER — Other Ambulatory Visit: Payer: Self-pay | Admitting: Internal Medicine

## 2016-01-15 DIAGNOSIS — F32A Depression, unspecified: Secondary | ICD-10-CM

## 2016-01-15 DIAGNOSIS — J411 Mucopurulent chronic bronchitis: Secondary | ICD-10-CM

## 2016-01-15 DIAGNOSIS — I10 Essential (primary) hypertension: Secondary | ICD-10-CM

## 2016-01-15 DIAGNOSIS — E039 Hypothyroidism, unspecified: Secondary | ICD-10-CM

## 2016-01-15 DIAGNOSIS — J0111 Acute recurrent frontal sinusitis: Secondary | ICD-10-CM

## 2016-01-15 DIAGNOSIS — K219 Gastro-esophageal reflux disease without esophagitis: Secondary | ICD-10-CM

## 2016-01-15 DIAGNOSIS — E7800 Pure hypercholesterolemia, unspecified: Secondary | ICD-10-CM

## 2016-01-15 DIAGNOSIS — F329 Major depressive disorder, single episode, unspecified: Secondary | ICD-10-CM

## 2016-01-20 ENCOUNTER — Ambulatory Visit: Payer: Medicare Other | Attending: Pain Medicine | Admitting: Pain Medicine

## 2016-01-20 ENCOUNTER — Encounter: Payer: Self-pay | Admitting: Pain Medicine

## 2016-01-20 VITALS — BP 110/84 | HR 68 | Temp 97.9°F | Resp 20 | Ht 61.0 in | Wt 180.0 lb

## 2016-01-20 DIAGNOSIS — M5481 Occipital neuralgia: Secondary | ICD-10-CM

## 2016-01-20 DIAGNOSIS — M542 Cervicalgia: Secondary | ICD-10-CM | POA: Diagnosis present

## 2016-01-20 DIAGNOSIS — M47817 Spondylosis without myelopathy or radiculopathy, lumbosacral region: Secondary | ICD-10-CM

## 2016-01-20 DIAGNOSIS — Z9889 Other specified postprocedural states: Secondary | ICD-10-CM | POA: Insufficient documentation

## 2016-01-20 DIAGNOSIS — B029 Zoster without complications: Secondary | ICD-10-CM

## 2016-01-20 DIAGNOSIS — M19011 Primary osteoarthritis, right shoulder: Secondary | ICD-10-CM

## 2016-01-20 DIAGNOSIS — M7550 Bursitis of unspecified shoulder: Secondary | ICD-10-CM | POA: Diagnosis not present

## 2016-01-20 DIAGNOSIS — M5136 Other intervertebral disc degeneration, lumbar region: Secondary | ICD-10-CM | POA: Insufficient documentation

## 2016-01-20 DIAGNOSIS — M7060 Trochanteric bursitis, unspecified hip: Secondary | ICD-10-CM

## 2016-01-20 DIAGNOSIS — M19019 Primary osteoarthritis, unspecified shoulder: Secondary | ICD-10-CM | POA: Diagnosis not present

## 2016-01-20 DIAGNOSIS — M533 Sacrococcygeal disorders, not elsewhere classified: Secondary | ICD-10-CM | POA: Diagnosis not present

## 2016-01-20 DIAGNOSIS — M7551 Bursitis of right shoulder: Secondary | ICD-10-CM

## 2016-01-20 DIAGNOSIS — S32000A Wedge compression fracture of unspecified lumbar vertebra, initial encounter for closed fracture: Secondary | ICD-10-CM

## 2016-01-20 DIAGNOSIS — R51 Headache: Secondary | ICD-10-CM | POA: Diagnosis present

## 2016-01-20 DIAGNOSIS — M5126 Other intervertebral disc displacement, lumbar region: Secondary | ICD-10-CM | POA: Insufficient documentation

## 2016-01-20 MED ORDER — HYDROCODONE-ACETAMINOPHEN 5-325 MG PO TABS: ORAL_TABLET | ORAL | Status: DC

## 2016-01-20 NOTE — Patient Instructions (Addendum)
PLAN   Continue present medication hydrocodone acetaminophen  Lumbosacral selective nerve root block to be performed at time return appointment  F/U PCP Dr.Hande  for evaliation of  BP shingles  and general medical  condition  F/U surgical evaluation. May consider pending follow-up evaluations  F/U neurological evaluation. May consider PNCV/EMG studies and other studies pending follow-up evaluations  May consider radiofrequency rhizolysis or intraspinal procedures pending response to present treatment and F/U evaluation   Patient to call pain management prior to scheduled return appointment for any concernSelective Nerve Root Block Patient Information  Description: Specific nerve roots exit the spinal canal and these nerves can be compressed and inflamed by a bulging disc and bone spurs.  By injecting steroids on the nerve root, we can potentially decrease the inflammation surrounding these nerves, which often leads to decreased pain.  Also, by injecting local anesthesia on the nerve root, this can provide [place] helpful information to give to your referring doctor if it decreases your pain.  Selective nerve root blocks can be done along the spine from the neck to the low back depending on the location of your pain.   After numbing the skin with local anesthesia, a small needle is passed to the nerve root and the position of the needle is verified using x-ray pictures.  After the needle is in correct position, we then deposit the medication.  You may experience a pressure sensation while this is being done.  The entire block usually lasts less than 15 minutes.  Conditions that may be treated with selective nerve root blocks:  Low back and leg pain  Spinal stenosis  Diagnostic block prior to potential surgery  Neck and arm pain  Post laminectomy syndrome  Preparation for the injection:  1. Do not eat any solid food or dairy products within 8 hours of your appointment. 2. You may drink  clear liquids up to 3 hours before an appointment.  Clear liquids include water, black coffee, juice or soda.  No milk or cream please. 3. You may take your regular medications, including pain medications, with a sip of water before your appointment.  Diabetics should hold regular insulin (if taken separately) and take 1/2 normal NPH dose the morning of the procedure.  Carry some sugar containing items with you to your appointment. 4. A driver must accompany you and be prepared to drive you home after your procedure. 5. Bring all your current medications with you. 6. An IV may be inserted and sedation may be given at the discretion of the physician. 7. A blood pressure cuff, EKG, and other monitors will often be applied during the procedure.  Some patients may need to have extra oxygen administered for a short period. 8. You will be asked to provide medical information, including allergies, prior to the procedure.  We must know immediately if you are taking blood  Thinners (like Coumadin) or if you are allergic to IV iodine contrast (dye).  Possible side-effects: All are usually temporary  Bleeding from needle site  Light headedness  Numbness and tingling  Decreased blood pressure  Weakness in arms/legs  Pressure sensation in back/neck  Pain at injection site (several days)  Possible complications: All are extremely rare  Infection  Nerve injury  Spinal headache (a headache wore with upright position)  Call if you experience:  Fever/chills associated with headache or increased back/neck pain  Headache worsened by an upright position  New onset weakness or numbness of an extremity below the injection  site  Hives or difficulty breathing (go to the emergency room)  Inflammation or drainage at the injection site(s)  Severe back/neck pain greater than usual  New symptoms which are concerning to you  Please note:  Although the local anesthetic injected can often make  your back or neck feel good for several hours after the injection the pain will likely return.  It takes 3-5 days for steroids to work on the nerve root. You may not notice any pain relief for at least one week.  If effective, we will often do a series of 3 injections spaced 3-6 weeks apart to maximally decrease your pain.    If you have any questions, please call [phone] [facility] Clinic[facility] R[facility]KS AND COMPLICATIONS  What are the risk, side effects and possible complications? Generally speaking, most procedures are safe.  However, with any procedure there are risks, side effects, and the possibility of complications.  The risks and complications are dependent upon the sites that are lesioned, or the type of nerve block to be performed.  The closer the procedure is to the spine, the more serious the risks are.  Great care is taken when placing the radio frequency needles, block needles or lesioning probes, but sometimes complications can occur. 1. Infection: Any time there is an injection through the skin, there is a risk of infection.  This is why sterile conditions are used for these blocks.  There are four possible types of infection. 1. Localized skin infection. 2. Central Nervous System Infection-This can be in the form of Meningitis, which can be deadly. 3. Epidural Infections-This can be in the form of an epidural abscess, which can cause pressure inside of the spine, causing compression of the spinal cord with subsequent paralysis. This would require an emergency surgery to decompress, and there are no guarantees that the patient would recover from the paralysis. 4. Discitis-This is an infection of the intervertebral discs.  It occurs in about 1% of discography procedures.  It is difficult to treat and it may lead to surgery.        2. Pain: the needles have to go through skin and soft tissues, will cause soreness.       3. Damage to internal structures:   The nerves to be lesioned may be near blood vessels or    other nerves which can be potentially damaged.       4. Bleeding: Bleeding is more common if the patient is taking blood thinners such as  aspirin, Coumadin, Ticiid, Plavix, etc., or if he/she have some genetic predisposition  such as hemophilia. Bleeding into the spinal canal can cause compression of the spinal  cord with subsequent paralysis.  This would require an emergency surgery to  decompress and there are no guarantees that the patient would recover from the  paralysis.       5. Pneumothorax:  Puncturing of a lung is a possibility, every time a needle is introduced in  the area of the chest or upper back.  Pneumothorax refers to free air around the  collapsed lung(s), inside of the thoracic cavity (chest cavity).  Another two possible  complications related to a similar event would include: Hemothorax and Chylothorax.   These are variations of the Pneumothorax, where instead of air around the collapsed  lung(s), you may have blood or chyle, respectively.       6. Spinal headaches: They may occur with any procedures in the area of the spine.  7. Persistent CSF (Cerebro-Spinal Fluid) leakage: This is a rare problem, but may occur  with prolonged intrathecal or epidural catheters either due to the formation of a fistulous  track or a dural tear.       8. Nerve damage: By working so close to the spinal cord, there is always a possibility of  nerve damage, which could be as serious as a permanent spinal cord injury with  paralysis.       9. Death:  Although rare, severe deadly allergic reactions known as "Anaphylactic  reaction" can occur to any of the medications used.      10. Worsening of the symptoms:  We can always make thing worse.  What are the chances of something like this happening? Chances of any of this occuring are extremely low.  By statistics, you have more of a chance of getting killed in a motor vehicle accident: while  driving to the hospital than any of the above occurring .  Nevertheless, you should be aware that they are possibilities.  In general, it is similar to taking a shower.  Everybody knows that you can slip, hit your head and get killed.  Does that mean that you should not shower again?  Nevertheless always keep in mind that statistics do not mean anything if you happen to be on the wrong side of them.  Even if a procedure has a 1 (one) in a 1,000,000 (million) chance of going wrong, it you happen to be that one..Also, keep in mind that by statistics, you have more of a chance of having something go wrong when taking medications.  Who should not have this procedure? If you are on a blood thinning medication (e.g. Coumadin, Plavix, see list of "Blood Thinners"), or if you have an active infection going on, you should not have the procedure.  If you are taking any blood thinners, please inform your physician.  How should I prepare for this procedure?  Do not eat or drink anything at least six hours prior to the procedure.  Bring a driver with you .  It cannot be a taxi.  Come accompanied by an adult that can drive you back, and that is strong enough to help you if your legs get weak or numb from the local anesthetic.  Take all of your medicines the morning of the procedure with just enough water to swallow them.  If you have diabetes, make sure that you are scheduled to have your procedure done first thing in the morning, whenever possible.  If you have diabetes, take only half of your insulin dose and notify our nurse that you have done so as soon as you arrive at the clinic.  If you are diabetic, but only take blood sugar pills (oral hypoglycemic), then do not take them on the morning of your procedure.  You may take them after you have had the procedure.  Do not take aspirin or any aspirin-containing medications, at least eleven (11) days prior to the procedure.  They may prolong bleeding.  Wear  loose fitting clothing that may be easy to take off and that you would not mind if it got stained with Betadine or blood.  Do not wear any jewelry or perfume  Remove any nail coloring.  It will interfere with some of our monitoring equipment.  NOTE: Remember that this is not meant to be interpreted as a complete list of all possible complications.  Unforeseen problems may occur.  BLOOD THINNERS  The following drugs contain aspirin or other products, which can cause increased bleeding during surgery and should not be taken for 2 weeks prior to and 1 week after surgery.  If you should need take something for relief of minor pain, you may take acetaminophen which is found in Tylenol,m Datril, Anacin-3 and Panadol. It is not blood thinner. The products listed below are.  Do not take any of the products listed below in addition to any listed on your instruction sheet.  A.P.C or A.P.C with Codeine Codeine Phosphate Capsules #3 Ibuprofen Ridaura  ABC compound Congesprin Imuran rimadil  Advil Cope Indocin Robaxisal  Alka-Seltzer Effervescent Pain Reliever and Antacid Coricidin or Coricidin-D  Indomethacin Rufen  Alka-Seltzer plus Cold Medicine Cosprin Ketoprofen S-A-C Tablets  Anacin Analgesic Tablets or Capsules Coumadin Korlgesic Salflex  Anacin Extra Strength Analgesic tablets or capsules CP-2 Tablets Lanoril Salicylate  Anaprox Cuprimine Capsules Levenox Salocol  Anexsia-D Dalteparin Magan Salsalate  Anodynos Darvon compound Magnesium Salicylate Sine-off  Ansaid Dasin Capsules Magsal Sodium Salicylate  Anturane Depen Capsules Marnal Soma  APF Arthritis pain formula Dewitt's Pills Measurin Stanback  Argesic Dia-Gesic Meclofenamic Sulfinpyrazone  Arthritis Bayer Timed Release Aspirin Diclofenac Meclomen Sulindac  Arthritis pain formula Anacin Dicumarol Medipren Supac  Analgesic (Safety coated) Arthralgen Diffunasal Mefanamic Suprofen  Arthritis Strength Bufferin Dihydrocodeine Mepro Compound  Suprol  Arthropan liquid Dopirydamole Methcarbomol with Aspirin Synalgos  ASA tablets/Enseals Disalcid Micrainin Tagament  Ascriptin Doan's Midol Talwin  Ascriptin A/D Dolene Mobidin Tanderil  Ascriptin Extra Strength Dolobid Moblgesic Ticlid  Ascriptin with Codeine Doloprin or Doloprin with Codeine Momentum Tolectin  Asperbuf Duoprin Mono-gesic Trendar  Aspergum Duradyne Motrin or Motrin IB Triminicin  Aspirin plain, buffered or enteric coated Durasal Myochrisine Trigesic  Aspirin Suppositories Easprin Nalfon Trillsate  Aspirin with Codeine Ecotrin Regular or Extra Strength Naprosyn Uracel  Atromid-S Efficin Naproxen Ursinus  Auranofin Capsules Elmiron Neocylate Vanquish  Axotal Emagrin Norgesic Verin  Azathioprine Empirin or Empirin with Codeine Normiflo Vitamin E  Azolid Emprazil Nuprin Voltaren  Bayer Aspirin plain, buffered or children's or timed BC Tablets or powders Encaprin Orgaran Warfarin Sodium  Buff-a-Comp Enoxaparin Orudis Zorpin  Buff-a-Comp with Codeine Equegesic Os-Cal-Gesic   Buffaprin Excedrin plain, buffered or Extra Strength Oxalid   Bufferin Arthritis Strength Feldene Oxphenbutazone   Bufferin plain or Extra Strength Feldene Capsules Oxycodone with Aspirin   Bufferin with Codeine Fenoprofen Fenoprofen Pabalate or Pabalate-SF   Buffets II Flogesic Panagesic   Buffinol plain or Extra Strength Florinal or Florinal with Codeine Panwarfarin   Buf-Tabs Flurbiprofen Penicillamine   Butalbital Compound Four-way cold tablets Penicillin   Butazolidin Fragmin Pepto-Bismol   Carbenicillin Geminisyn Percodan   Carna Arthritis Reliever Geopen Persantine   Carprofen Gold's salt Persistin   Chloramphenicol Goody's Phenylbutazone   Chloromycetin Haltrain Piroxlcam   Clmetidine heparin Plaquenil   Cllnoril Hyco-pap Ponstel   Clofibrate Hydroxy chloroquine Propoxyphen         Before stopping any of these medications, be sure to consult the physician who ordered them.  Some,  such as Coumadin (Warfarin) are ordered to prevent or treat serious conditions such as "deep thrombosis", "pumonary embolisms", and other heart problems.  The amount of time that you may need off of the medication may also vary with the medication and the reason for which you were taking it.  If you are taking any of these medications, please make sure you notify your pain physician before you undergo any procedures.         Facet Blocks Patient  Information  Description: The facets are joints in the spine between the vertebrae.  Like any joints in the body, facets can become irritated and painful.  Arthritis can also effect the facets.  By injecting steroids and local anesthetic in and around these joints, we can temporarily block the nerve supply to them.  Steroids act directly on irritated nerves and tissues to reduce selling and inflammation which often leads to decreased pain.  Facet blocks may be done anywhere along the spine from the neck to the low back depending upon the location of your pain.   After numbing the skin with local anesthetic (like Novocaine), a small needle is passed onto the facet joints under x-ray guidance.  You may experience a sensation of pressure while this is being done.  The entire block usually lasts about 15-25 minutes.   Conditions which may be treated by facet blocks:   Low back/buttock pain  Neck/shoulder pain  Certain types of headaches  Preparation for the injection:  8. Do not eat any solid food or dairy products within 8 hours of your appointment. 9. You may drink clear liquid up to 3 hours before appointment.  Clear liquids include water, black coffee, juice or soda.  No milk or cream please. 10. You may take your regular medication, including pain medications, with a sip of water before your appointment.  Diabetics should hold regular insulin (if taken separately) and take 1/2 normal NPH dose the morning of the procedure.  Carry some sugar  containing items with you to your appointment. 11. A driver must accompany you and be prepared to drive you home after your procedure. 12. Bring all your current medications with you. 13. An IV may be inserted and sedation may be given at the discretion of the physician. 14. A blood pressure cuff, EKG and other monitors will often be applied during the procedure.  Some patients may need to have extra oxygen administered for a short period. 78. You will be asked to provide medical information, including your allergies and medications, prior to the procedure.  We must know immediately if you are taking blood thinners (like Coumadin/Warfarin) or if you are allergic to IV iodine contrast (dye).  We must know if you could possible be pregnant.  Possible side-effects:   Bleeding from needle site  Infection (rare, may require surgery)  Nerve injury (rare)  Numbness & tingling (temporary)  Difficulty urinating (rare, temporary)  Spinal headache (a headache worse with upright posture)  Light-headedness (temporary)  Pain at injection site (serveral days)  Decreased blood pressure (rare, temporary)  Weakness in arm/leg (temporary)  Pressure sensation in back/neck (temporary)   Call if you experience:   Fever/chills associated with headache or increased back/neck pain  Headache worsened by an upright position  New onset, weakness or numbness of an extremity below the injection site  Hives or difficulty breathing (go to the emergency room)  Inflammation or drainage at the injection site(s)  Severe back/neck pain greater than usual  New symptoms which are concerning to you  Please note:  Although the local anesthetic injected can often make your back or neck feel good for several hours after the injection, the pain will likely return. It takes 3-7 days for steroids to work.  You may not notice any pain relief for at least one week.  If effective, we will often do a series of  2-3 injections spaced 3-6 weeks apart to maximally decrease your pain.  After the initial series, you  may be a candidate for a more permanent nerve block of the facets.  If you have any questions, please call #[phone] [place]  What are the risk, side effects and possible complications? Generally speaking, most procedures are safe.  However, with any procedure there are risks, side effects, and the possibility of complications.  The risks and complications are dependent upon the sites that are lesioned, or the type of nerve block to be performed.  The closer the procedure is to the spine, the more serious the risks are.  Great care is taken when placing the radio frequency needles, block needles or lesioning probes, but sometimes complications can occur. 1. Infection: Any time there is an injection through the skin, there is a risk of infection.  This is why sterile conditions are used for these blocks.  There are four possible types of infection. 1. Localized skin infection. 2. Central Nervous System Infection-This can be in the form of Meningitis, which can be deadly. 3. Epidural Infections-This can be in the form of an epidural abscess, which can cause pressure inside of the spine, causing compression of the spinal cord with subsequent paralysis. This would require an emergency surgery to decompress, and there are no guarantees that the patient would recover from the paralysis. 4. Discitis-This is an infection of the intervertebral discs.  It occurs in about 1% of discography procedures.  It is difficult to treat and it may lead to surgery.        2. Pain: the needles have to go through skin and soft tissues, will cause soreness.       3. Damage to internal structures:  The nerves to be lesioned may be near blood vessels or    other nerves which can be potentially damaged.       4. Bleeding: Bleeding is more common if the  patient is taking blood thinners such as  aspirin, Coumadin, Ticiid, Plavix, etc., or if he/she have some genetic predisposition  such as hemophilia. Bleeding into the spinal canal can cause compression of the spinal  cord with subsequent paralysis.  This would require an emergency surgery to  decompress and there are no guarantees that the patient would recover from the  paralysis.       5. Pneumothorax:  Puncturing of a lung is a possibility, every time a needle is introduced in  the area of the chest or upper back.  Pneumothorax refers to free air around the  collapsed lung(s), inside of the thoracic cavity (chest cavity).  Another two possible  complications related to a similar event would include: Hemothorax and Chylothorax.   These are variations of the Pneumothorax, where instead of air around the collapsed  lung(s), you may have blood or chyle, respectively.       6. Spinal headaches: They may occur with any procedures in the area of the spine.       7. Persistent CSF (Cerebro-Spinal Fluid) leakage: This is a rare problem, but may occur  with prolonged intrathecal or epidural catheters either due to the formation of a fistulous  track or a dural tear.       8. Nerve damage: By working so close to the spinal cord, there is always a possibility of  nerve damage, which could be as serious as a permanent spinal cord injury with  paralysis.       9. Death:  Although rare, severe deadly allergic reactions known as "  Anaphylactic  reaction" can occur to any of the medications used.      10. Worsening of the symptoms:  We can always make thing worse.  What are the chances of something like this happening? Chances of any of this occuring are extremely low.  By statistics, you have more of a chance of getting killed in a motor vehicle accident: while driving to the hospital than any of the above occurring .  Nevertheless, you should be aware that they are possibilities.  In general, it is similar to taking a  shower.  Everybody knows that you can slip, hit your head and get killed.  Does that mean that you should not shower again?  Nevertheless always keep in mind that statistics do not mean anything if you happen to be on the wrong side of them.  Even if a procedure has a 1 (one) in a 1,000,000 (million) chance of going wrong, it you happen to be that one..Also, keep in mind that by statistics, you have more of a chance of having something go wrong when taking medications.  Who should not have this procedure? If you are on a blood thinning medication (e.g. Coumadin, Plavix, see list of "Blood Thinners"), or if you have an active infection going on, you should not have the procedure.  If you are taking any blood thinners, please inform your physician.  How should I prepare for this procedure?  Do not eat or drink anything at least six hours prior to the procedure.  Bring a driver with you .  It cannot be a taxi.  Come accompanied by an adult that can drive you back, and that is strong enough to help you if your legs get weak or numb from the local anesthetic.  Take all of your medicines the morning of the procedure with just enough water to swallow them.  If you have diabetes, make sure that you are scheduled to have your procedure done first thing in the morning, whenever possible.  If you have diabetes, take only half of your insulin dose and notify our nurse that you have done so as soon as you arrive at the clinic.  If you are diabetic, but only take blood sugar pills (oral hypoglycemic), then do not take them on the morning of your procedure.  You may take them after you have had the procedure.  Do not take aspirin or any aspirin-containing medications, at least eleven (11) days prior to the procedure.  They may prolong bleeding.  Wear loose fitting clothing that may be easy to take off and that you would not mind if it got stained with Betadine or blood.  Do not wear any jewelry or  perfume  Remove any nail coloring.  It will interfere with some of our monitoring equipment.  NOTE: Remember that this is not meant to be interpreted as a complete list of all possible complications.  Unforeseen problems may occur.  BLOOD THINNERS The following drugs contain aspirin or other products, which can cause increased bleeding during surgery and should not be taken for 2 weeks prior to and 1 week after surgery.  If you should need take something for relief of minor pain, you may take acetaminophen which is found in Tylenol,m Datril, Anacin-3 and Panadol. It is not blood thinner. The products listed below are.  Do not take any of the products listed below in addition to any listed on your instruction sheet.  A.P.C or A.P.C with Codeine Codeine Phosphate  Capsules #3 Ibuprofen Ridaura  ABC compound Congesprin Imuran rimadil  Advil Cope Indocin Robaxisal  Alka-Seltzer Effervescent Pain Reliever and Antacid Coricidin or Coricidin-D  Indomethacin Rufen  Alka-Seltzer plus Cold Medicine Cosprin Ketoprofen S-A-C Tablets  Anacin Analgesic Tablets or Capsules Coumadin Korlgesic Salflex  Anacin Extra Strength Analgesic tablets or capsules CP-2 Tablets Lanoril Salicylate  Anaprox Cuprimine Capsules Levenox Salocol  Anexsia-D Dalteparin Magan Salsalate  Anodynos Darvon compound Magnesium Salicylate Sine-off  Ansaid Dasin Capsules Magsal Sodium Salicylate  Anturane Depen Capsules Marnal Soma  APF Arthritis pain formula Dewitt's Pills Measurin Stanback  Argesic Dia-Gesic Meclofenamic Sulfinpyrazone  Arthritis Bayer Timed Release Aspirin Diclofenac Meclomen Sulindac  Arthritis pain formula Anacin Dicumarol Medipren Supac  Analgesic (Safety coated) Arthralgen Diffunasal Mefanamic Suprofen  Arthritis Strength Bufferin Dihydrocodeine Mepro Compound Suprol  Arthropan liquid Dopirydamole Methcarbomol with Aspirin Synalgos  ASA tablets/Enseals Disalcid Micrainin Tagament  Ascriptin Doan's Midol  Talwin  Ascriptin A/D Dolene Mobidin Tanderil  Ascriptin Extra Strength Dolobid Moblgesic Ticlid  Ascriptin with Codeine Doloprin or Doloprin with Codeine Momentum Tolectin  Asperbuf Duoprin Mono-gesic Trendar  Aspergum Duradyne Motrin or Motrin IB Triminicin  Aspirin plain, buffered or enteric coated Durasal Myochrisine Trigesic  Aspirin Suppositories Easprin Nalfon Trillsate  Aspirin with Codeine Ecotrin Regular or Extra Strength Naprosyn Uracel  Atromid-S Efficin Naproxen Ursinus  Auranofin Capsules Elmiron Neocylate Vanquish  Axotal Emagrin Norgesic Verin  Azathioprine Empirin or Empirin with Codeine Normiflo Vitamin E  Azolid Emprazil Nuprin Voltaren  Bayer Aspirin plain, buffered or children's or timed BC Tablets or powders Encaprin Orgaran Warfarin Sodium  Buff-a-Comp Enoxaparin Orudis Zorpin  Buff-a-Comp with Codeine Equegesic Os-Cal-Gesic   Buffaprin Excedrin plain, buffered or Extra Strength Oxalid   Bufferin Arthritis Strength Feldene Oxphenbutazone   Bufferin plain or Extra Strength Feldene Capsules Oxycodone with Aspirin   Bufferin with Codeine Fenoprofen Fenoprofen Pabalate or Pabalate-SF   Buffets II Flogesic Panagesic   Buffinol plain or Extra Strength Florinal or Florinal with Codeine Panwarfarin   Buf-Tabs Flurbiprofen Penicillamine   Butalbital Compound Four-way cold tablets Penicillin   Butazolidin Fragmin Pepto-Bismol   Carbenicillin Geminisyn Percodan   Carna Arthritis Reliever Geopen Persantine   Carprofen Gold's salt Persistin   Chloramphenicol Goody's Phenylbutazone   Chloromycetin Haltrain Piroxlcam   Clmetidine heparin Plaquenil   Cllnoril Hyco-pap Ponstel   Clofibrate Hydroxy chloroquine Propoxyphen         Before stopping any of these medications, be sure to consult the physician who ordered them.  Some, such as Coumadin (Warfarin) are ordered to prevent or treat serious conditions such as "deep thrombosis", "pumonary embolisms", and other heart  problems.  The amount of time that you may need off of the medication may also vary with the medication and the reason for which you were taking it.  If you are taking any of these medications, please make sure you notify your pain physician before you undergo any procedures.

## 2016-01-20 NOTE — Progress Notes (Signed)
Subjective:    Patient ID: Janice Perkins, female    DOB: 04-Dec-1932, 82 y.o.   MRN: [id number]  HPI  The patient is an 82 year old female who returns to pain management for further evaluation and treatment of pain which is involving the neck headaches shoulders lower back lower extremity region. The present time patient states that her pain appears to be in the region of the lower back and lower extremity region on the left. The patient is with history of shingles involving the lumbar and lower extremity region on the left. We discussed patient's condition and will proceed with interventional treatment in attempt to decrease severity of symptoms, minimize progression of symptoms and avoid the need for more involved treatment. The patient is with shingles with there being concern regarding shingles progression of the postherpetic neuralgia. We will proceed with lumbosacral selective nerve root block at time return appointment in attempt to decrease severity of symptoms minimize progression of symptoms and avoid the need for more involved treatment. There is concern regarding intraspinal abnormalities continue patient's symptomatology with concern regarding radiculopathy in addition to postherpetic neuralgia. The patient will benefit more significantly from lumbosacral selective nerve root block compared to lumbar sympathetic block that she has been made to proceed with lumbosacral selective nerve root block which will be more effective in terms of relieving pain due to intraspinal abnormalities as well as postherpetic neuralgia. All agreed to suggested treatment plan. Patient will continue hydrocodone acetaminophen as prescribed at this time the patient denied any other trauma change in events of daily living the call significant change in symptoms,    Review of Systems     Objective:   Physical Exam   There was tenderness to palpation of the paraspinal muscular treat the cervical region cervical  facet region a mild degree with mild tenderness of the acromioclavicular and glenohumeral joint region the patient appeared to be with slightly decreased grip strength and Tinel and Phalen's maneuver were without increased pain of significant degree. The patient appeared to be unremarkable Spurling's maneuver. Palpation over the thoracic region thoracic facet region reproduces moderate discomfort in the mid and lower thoracic region. There was no crepitus of the thoracic region noted. Palpation over the lumbar paraspinal must reason lumbar facet region was attends to palpation of moderately severe degree with lateral bending rotation extension and palpation of the lumbar facets reproducing moderately severe discomfort. There was moderately severe tenderness of the PSIS and PII S region left greater than right as well. No sensory deficit dermatomal dystrophy detected. Straight leg raise was tolerates approximately 20 without a definite increased pain with dorsiflexion noted. EHL strength appeared to be decreased. There was significant tends to palpation of the PSIS and PII S region with mild tenderness along the greater. Region iliotibial band region. EHL strength appeared to be decreased. Palpation of the lumbar paraspinal musculature region was attends to palpation of moderately severe degree with pain. To be with dermatomal distribution of approximately L4. There was no costovertebral tenderness noted no abdominal tends to palpation noted       Assessment & Plan:      Shingles (left lumbosacral region)  Sacroiliac joint dysfunction  Degenerative joint disease of shoulder  Degenerative disc disease lumbar spine Suspected acute compression fracture at L3, superiorly on the LEFT. No retropulsion.  Moderate multifactorial spinal stenosis is suspected at L2-3 and L3-4. Previous lumbar surgery at L4-5 with adequate posterior decompression. Incomplete arthrodesis with 4 mm  anterolisthesis. Probable solid arthrodesis at  L5-S1 with BILATERAL foraminal  narrowing Moderate involvement L3-4 and L4-L5 with broad-based disc bulge and facet arthropathy present at L2-3, L3-4, L4-5, and L5-S1Lumbar facet syndrome   Gluteal and piriformis neuralgia  Bursitis of shoulder  Degenerative joint disease of shoulder     PLAN   Continue present medication hydrocodone acetaminophen  Lumbosacral selective nerve root block to be performed at time return appointment  F/U PCP Dr.Hande  for evaliation of  BP shingles  and general medical  condition  F/U surgical evaluation. May consider pending follow-up evaluations  F/U neurological evaluation. May consider PNCV/EMG studies and other studies pending follow-up evaluations  May consider radiofrequency rhizolysis or intraspinal procedures pending response to present treatment and F/U evaluation   Patient to call pain management prior to scheduled return appointment for any concern

## 2016-01-30 ENCOUNTER — Ambulatory Visit
Admission: RE | Admit: 2016-01-30 | Discharge: 2016-01-30 | Disposition: A | Discharge status: Home / Self Care | Payer: Medicare Other | Source: Ambulatory Visit | Attending: Internal Medicine | Admitting: Internal Medicine

## 2016-01-30 DIAGNOSIS — J0111 Acute recurrent frontal sinusitis: Secondary | ICD-10-CM | POA: Insufficient documentation

## 2016-01-30 DIAGNOSIS — M2669 Other specified disorders of temporomandibular joint: Secondary | ICD-10-CM | POA: Diagnosis not present

## 2016-01-30 DIAGNOSIS — I10 Essential (primary) hypertension: Secondary | ICD-10-CM

## 2016-01-30 DIAGNOSIS — E039 Hypothyroidism, unspecified: Secondary | ICD-10-CM

## 2016-01-30 DIAGNOSIS — J411 Mucopurulent chronic bronchitis: Secondary | ICD-10-CM

## 2016-01-30 DIAGNOSIS — E7800 Pure hypercholesterolemia, unspecified: Secondary | ICD-10-CM

## 2016-01-30 DIAGNOSIS — Z9889 Other specified postprocedural states: Secondary | ICD-10-CM | POA: Insufficient documentation

## 2016-01-30 DIAGNOSIS — F32A Depression, unspecified: Secondary | ICD-10-CM

## 2016-01-30 DIAGNOSIS — J339 Nasal polyp, unspecified: Secondary | ICD-10-CM | POA: Diagnosis not present

## 2016-01-30 DIAGNOSIS — K219 Gastro-esophageal reflux disease without esophagitis: Secondary | ICD-10-CM

## 2016-01-30 DIAGNOSIS — F329 Major depressive disorder, single episode, unspecified: Secondary | ICD-10-CM

## 2016-02-03 ENCOUNTER — Ambulatory Visit: Payer: Medicare Other | Attending: Pain Medicine | Admitting: Pain Medicine

## 2016-02-03 ENCOUNTER — Encounter: Payer: Self-pay | Admitting: Pain Medicine

## 2016-02-03 VITALS — BP 172/94 | HR 68 | Temp 98.1°F | Resp 18 | Ht 61.0 in | Wt 180.0 lb

## 2016-02-03 DIAGNOSIS — M5136 Other intervertebral disc degeneration, lumbar region: Secondary | ICD-10-CM | POA: Insufficient documentation

## 2016-02-03 DIAGNOSIS — Z9889 Other specified postprocedural states: Secondary | ICD-10-CM | POA: Diagnosis not present

## 2016-02-03 DIAGNOSIS — M5481 Occipital neuralgia: Secondary | ICD-10-CM

## 2016-02-03 DIAGNOSIS — M7551 Bursitis of right shoulder: Secondary | ICD-10-CM

## 2016-02-03 DIAGNOSIS — S32000A Wedge compression fracture of unspecified lumbar vertebra, initial encounter for closed fracture: Secondary | ICD-10-CM

## 2016-02-03 DIAGNOSIS — M47817 Spondylosis without myelopathy or radiculopathy, lumbosacral region: Secondary | ICD-10-CM

## 2016-02-03 DIAGNOSIS — M4316 Spondylolisthesis, lumbar region: Secondary | ICD-10-CM | POA: Insufficient documentation

## 2016-02-03 DIAGNOSIS — M545 Low back pain: Secondary | ICD-10-CM | POA: Diagnosis present

## 2016-02-03 DIAGNOSIS — M533 Sacrococcygeal disorders, not elsewhere classified: Secondary | ICD-10-CM

## 2016-02-03 DIAGNOSIS — M7060 Trochanteric bursitis, unspecified hip: Secondary | ICD-10-CM

## 2016-02-03 DIAGNOSIS — M19011 Primary osteoarthritis, right shoulder: Secondary | ICD-10-CM

## 2016-02-03 DIAGNOSIS — B029 Zoster without complications: Secondary | ICD-10-CM

## 2016-02-03 DIAGNOSIS — M51369 Other intervertebral disc degeneration, lumbar region without mention of lumbar back pain or lower extremity pain: Secondary | ICD-10-CM

## 2016-02-03 DIAGNOSIS — M79606 Pain in leg, unspecified: Secondary | ICD-10-CM | POA: Diagnosis present

## 2016-02-03 MED ORDER — CIPROFLOXACIN IN D5W 400 MG/200ML IV SOLN
400.0000 mg | Freq: Once | INTRAVENOUS | Status: AC
  Administered 2016-02-03: 400 mg via INTRAVENOUS
  Filled 2016-02-03: qty 200

## 2016-02-03 MED ORDER — LACTATED RINGERS IV SOLN
1000.0000 mL | INTRAVENOUS | Status: DC
  Administered 2016-02-03: 1000 mL via INTRAVENOUS

## 2016-02-03 MED ORDER — TRIAMCINOLONE ACETONIDE 40 MG/ML IJ SUSP
40.0000 mg | Freq: Once | INTRAMUSCULAR | Status: AC
  Administered 2016-02-03: 40 mg
  Filled 2016-02-03: qty 1

## 2016-02-03 MED ORDER — MIDAZOLAM HCL 5 MG/5ML IJ SOLN
5.0000 mg | Freq: Once | INTRAMUSCULAR | Status: AC
  Administered 2016-02-03: 2 mg via INTRAVENOUS
  Filled 2016-02-03: qty 5

## 2016-02-03 MED ORDER — BUPIVACAINE HCL (PF) 0.25 % IJ SOLN
30.0000 mL | Freq: Once | INTRAMUSCULAR | Status: AC
  Administered 2016-02-03: 30 mL
  Filled 2016-02-03: qty 30

## 2016-02-03 MED ORDER — LIDOCAINE HCL (PF) 1 % IJ SOLN
10.0000 mL | Freq: Once | INTRAMUSCULAR | Status: AC
  Administered 2016-02-03: 10 mL via SUBCUTANEOUS

## 2016-02-03 MED ORDER — ORPHENADRINE CITRATE 30 MG/ML IJ SOLN
60.0000 mg | Freq: Once | INTRAMUSCULAR | Status: AC
  Administered 2016-02-03: 60 mg via INTRAMUSCULAR
  Filled 2016-02-03: qty 2

## 2016-02-03 MED ORDER — FENTANYL CITRATE (PF) 100 MCG/2ML IJ SOLN
100.0000 ug | Freq: Once | INTRAMUSCULAR | Status: AC
  Administered 2016-02-03: 50 ug via INTRAVENOUS
  Filled 2016-02-03: qty 2

## 2016-02-03 MED ORDER — SODIUM CHLORIDE 0.9% FLUSH
20.0000 mL | Freq: Once | INTRAVENOUS | Status: AC
  Administered 2016-02-03: 20 mL

## 2016-02-03 MED ORDER — CIPROFLOXACIN HCL 250 MG PO TABS: 250.0000 mg | ORAL_TABLET | Freq: Two times a day (BID) | ORAL | Status: DC

## 2016-02-03 NOTE — Progress Notes (Signed)
Safety precautions to be maintained throughout the outpatient stay will include: orient to surroundings, keep bed in low position, maintain call bell within reach at all times, provide assistance with transfer out of bed and ambulation.  

## 2016-02-03 NOTE — Patient Instructions (Addendum)
PLAN   Continue present medication hydrocodone acetaminophen Please get Cipro antibiotic today and begin taking Cipro antibiotic today   F/U PCP Dr.Hande  for evaliation of  BP and general medical  condition .   F/U surgical evaluation. May consider pending follow-up evaluations  F/U  Dr Rudene Christians as discussed  F/U neurological evaluation. May consider PNCV/EMG studies and other studies pending follow-up evaluations  May consider radiofrequency rhizolysis or intraspinal procedures pending response to present treatment and F/U evaluation   Patient to call Pain Management Center should patient have concerns prior to scheduled return appointmentPain Management Discharge Instructions  General Discharge Instructions :  If you need to reach your doctor call: Monday-Friday 8:00 am - 4:00 pm at [phone] or toll free [phone].  After clinic hours [phone] to have operator reach doctor.  Bring all of your medication bottles to all your appointments in the pain clinic.  To cancel or reschedule your appointment with Pain Management please remember to call 24 hours in advance to avoid a fee.  Refer to the educational materials which you have been given on: General Risks, I had my Procedure. Discharge Instructions, Post Sedation.  Post Procedure Instructions:  The drugs you were given will stay in your system until tomorrow, so for the next 24 hours you should not drive, make any legal decisions or drink any alcoholic beverages.  You may eat anything you prefer, but it is better to start with liquids then soups and crackers, and gradually work up to solid foods.  Please notify your doctor immediately if you have any unusual bleeding, trouble breathing or pain that is not related to your normal pain.  Depending on the type of procedure that was done, some parts of your body may feel week and/or numb.  This usually clears up by tonight or the next day.  Walk with the use of an assistive  device or accompanied by an adult for the 24 hours.  You may use ice on the affected area for the first 24 hours.  Put ice in a Ziploc bag and cover with a towel and place against area 15 minutes on 15 minutes off.  You may switch to heat after 24 hours.

## 2016-02-03 NOTE — Progress Notes (Signed)
Subjective:    Patient ID: Octavio Graves, female    DOB: 1932-11-15, 82 y.o.   MRN: [id number]  HPI  PROCEDURE PERFORMED: Lumbosacral selective nerve root block   NOTE: The patient is a 82 y.o. female who returns to [facility] for further evaluation and treatment of pain involving the lumbar and lower extremity region. Studies consisting of MRI has revealed the patient to be with evidence of degenerative disc disease lumbar spine Suspected acute compression fracture at L3, superiorly on the LEFT. No retropulsion. Moderate multifactorial spinal stenosis is suspected at L2-3 and L3-4. Previous lumbar surgery at L4-5 with adequate posterior decompression. Incomplete arthrodesis with 4 mm anterolisthesis. Probable solid arthrodesis at L5-S1 with BILATERAL foraminal  narrowing Moderate involvement L3-4 and L4-L5 with broad-based disc bulge and facet arthropathy present at L2-3, L3-4, L4-5, and L5-S1Lumbar facet syndrome .Marland Kitchen The patient also is with history of shingles involving the lumbar lower extremity region on the left. There is concern regarding intraspinal abnormalities contributing to patient's symptomatology with concern regarding radicular component contributing to patient's symptoms. The risks, benefits, and expectations of the procedure have been explained to the patient who was understanding and in agreement with suggested treatment plan. We will proceed with interventional treatment as discussed and as explained to the patient. The patient is understanding and in agreement with suggested treatment plan.   DESCRIPTION OF PROCEDURE: Lumbosacral selective nerve root block with IV Versed, IV fentanyl conscious sedation, EKG, blood pressure, pulse, capnography, and pulse oximetry monitoring. The procedure was performed with the patient in the prone position under fluoroscopic guidance. With the patient in the prone position, Betadine prep of proposed entry site was performed.  Local anesthetic skin wheal of proposed needle entry site was prepared with 1.5% plain lidocaine with AP view of the lumbosacral spine.   PROCEDURE #1: Needle placement at the left L 2 vertebral body: A 22 -gauge needle was inserted at the inferior border of the transverse process of the vertebral body with needle placed medial to the midline of the transverse process on AP view of the lumbosacral spine.   NEEDLE PLACEMENT AT  L3 and L4  VERTEBRAL BODY LEVELS  Needle  placement was accomplished at L3 and L4  vertebral body levels on the left side exactly as was accomplished at the L2  vertebral body level  and utilizing the same technique and under fluoroscopic guidance.  PROCEDURE #4: Needle placement at the S1 foramen. With the patient in the prone position with Betadine prep of proposed entry site accomplished, the S1 foramen was visualized under fluoroscopic guidance with AP view of the lumbosacral spine with cephalad orientation of the fluoroscope with local anesthetic skin wheal of 1.5% lidocaine of proposed needle entry site prepared. A 22-gauge needle was inserted S1 foramen under fluoroscopic guidance eliciting paresthesias radiating from the buttocks to the lower extremity after which needle was slightly withdrawn.   Needle placement was then verified on lateral view at all levels with needle tip documented to be in the posterior superior quadrant of the intervertebral foramen of  L 2, L3, L4, and needle tip documented at the level of the S1 foramen. Following negative aspiration for heme and CSF at each level, each level was injected with 3 mL of 0.25% bupivacaine with Kenalog.    The patient tolerated the procedure well. A total of 10 mg of Kenalog was utilized for the procedure.   PLAN:  1. Medications: Will continue presently prescribed medication hydrocodone acetaminophen. 2.  The patient is to undergo follow-up evaluation with PCP Dr.Hande  for evaluation of blood pressure and  general medical condition status post procedure performed on today's visit. 3. Surgical follow-up evaluation.Has been addressed  4. Neurological evaluation.May consider PNCV EMG studies and other studies  5. May consider radiofrequency procedures, implantation type procedures and other treatment pending response to treatment and follow-up evaluation. 6. The patient has been advise do adhere to proper body mechanics and avoid activities which may aggravate condition. 7. The patient has been advised to call the Pain Management Center prior to scheduled return appointment should there be significant change in the patient's condition or should the patient have other concerns regarding condition prior to scheduled return appointment.   Review of Systems     Objective:   Physical Exam        Assessment & Plan:

## 2016-02-04 ENCOUNTER — Telehealth: Payer: Self-pay | Admitting: *Deleted

## 2016-02-04 NOTE — Telephone Encounter (Signed)
No problems post procedure. 

## 2016-02-12 ENCOUNTER — Ambulatory Visit: Payer: Medicare Other | Attending: Pain Medicine | Admitting: Pain Medicine

## 2016-02-12 ENCOUNTER — Encounter: Payer: Self-pay | Admitting: Pain Medicine

## 2016-02-12 VITALS — BP 139/81 | HR 60 | Temp 98.2°F | Resp 16 | Ht 61.0 in | Wt 180.0 lb

## 2016-02-12 DIAGNOSIS — S32000A Wedge compression fracture of unspecified lumbar vertebra, initial encounter for closed fracture: Secondary | ICD-10-CM

## 2016-02-12 DIAGNOSIS — M19019 Primary osteoarthritis, unspecified shoulder: Secondary | ICD-10-CM | POA: Insufficient documentation

## 2016-02-12 DIAGNOSIS — B029 Zoster without complications: Secondary | ICD-10-CM | POA: Diagnosis not present

## 2016-02-12 DIAGNOSIS — M545 Low back pain: Secondary | ICD-10-CM | POA: Diagnosis present

## 2016-02-12 DIAGNOSIS — M7551 Bursitis of right shoulder: Secondary | ICD-10-CM

## 2016-02-12 DIAGNOSIS — G588 Other specified mononeuropathies: Secondary | ICD-10-CM | POA: Insufficient documentation

## 2016-02-12 DIAGNOSIS — M5136 Other intervertebral disc degeneration, lumbar region: Secondary | ICD-10-CM | POA: Diagnosis not present

## 2016-02-12 DIAGNOSIS — M4316 Spondylolisthesis, lumbar region: Secondary | ICD-10-CM | POA: Diagnosis not present

## 2016-02-12 DIAGNOSIS — M79604 Pain in right leg: Secondary | ICD-10-CM | POA: Diagnosis present

## 2016-02-12 DIAGNOSIS — M7550 Bursitis of unspecified shoulder: Secondary | ICD-10-CM | POA: Diagnosis not present

## 2016-02-12 DIAGNOSIS — M47817 Spondylosis without myelopathy or radiculopathy, lumbosacral region: Secondary | ICD-10-CM

## 2016-02-12 DIAGNOSIS — M5126 Other intervertebral disc displacement, lumbar region: Secondary | ICD-10-CM | POA: Diagnosis not present

## 2016-02-12 DIAGNOSIS — M79605 Pain in left leg: Secondary | ICD-10-CM | POA: Diagnosis present

## 2016-02-12 DIAGNOSIS — Z9889 Other specified postprocedural states: Secondary | ICD-10-CM | POA: Diagnosis not present

## 2016-02-12 DIAGNOSIS — M5481 Occipital neuralgia: Secondary | ICD-10-CM

## 2016-02-12 DIAGNOSIS — M533 Sacrococcygeal disorders, not elsewhere classified: Secondary | ICD-10-CM

## 2016-02-12 DIAGNOSIS — M7060 Trochanteric bursitis, unspecified hip: Secondary | ICD-10-CM

## 2016-02-12 DIAGNOSIS — M19011 Primary osteoarthritis, right shoulder: Secondary | ICD-10-CM

## 2016-02-12 MED ORDER — HYDROCODONE-ACETAMINOPHEN 5-325 MG PO TABS: ORAL_TABLET | ORAL | Status: DC

## 2016-02-12 NOTE — Patient Instructions (Addendum)
PLAN   Continue present medication hydrocodone acetaminophen  F/U PCP Dr.Hande  for evaliation of  BP shingles  and general medical  Condition. Please see Dr.Hande this week for evaluation of elevated blood pressure as we discussed   F/U surgical evaluation. May consider pending follow-up evaluations  F/U neurological evaluation. May consider PNCV/EMG studies and other studies pending follow-up evaluations  May consider radiofrequency rhizolysis or intraspinal procedures pending response to present treatment and F/U evaluation   Patient to call pain management prior to scheduled return appointment for any concernPain Management Discharge Instructions  General Discharge Instructions :  If you need to reach your doctor call: Monday-Friday 8:00 am - 4:00 pm at [phone] or toll free [phone].  After clinic hours [phone] to have operator reach doctor.  Bring all of your medication bottles to all your appointments in the pain clinic.  To cancel or reschedule your appointment with Pain Management please remember to call 24 hours in advance to avoid a fee.  Refer to the educational materials which you have been given on: General Risks, I had my Procedure. Discharge Instructions, Post Sedation.  Post Procedure Instructions:  The drugs you were given will stay in your system until tomorrow, so for the next 24 hours you should not drive, make any legal decisions or drink any alcoholic beverages.  You may eat anything you prefer, but it is better to start with liquids then soups and crackers, and gradually work up to solid foods.  Please notify your doctor immediately if you have any unusual bleeding, trouble breathing or pain that is not related to your normal pain.  Depending on the type of procedure that was done, some parts of your body may feel week and/or numb.  This usually clears up by tonight or the next day.  Walk with the use of an assistive device or accompanied by  an adult for the 24 hours.  You may use ice on the affected area for the first 24 hours.  Put ice in a Ziploc bag and cover with a towel and place against area 15 minutes on 15 minutes off.  You may switch to heat after 24 hours.

## 2016-02-12 NOTE — Progress Notes (Signed)
Safety precautions to be maintained throughout the outpatient stay will include: orient to surroundings, keep bed in low position, maintain call bell within reach at all times, provide assistance with transfer out of bed and ambulation.  

## 2016-02-12 NOTE — Progress Notes (Signed)
   Subjective:    Patient ID: Janice Perkins, female    DOB: 21-Feb-1933, 82 y.o.   MRN: [id number]  HPI  The patient is an 82 year old female who returns to pain management for further evaluation and treatment of pain involving the lumbar and lower extremity regions. Patient is with known degenerative changes of the lumbar spine and recently had diagnosis of shingles involving the lumbosacral region on the left. The patient states that the present time pain is well-controlled and is without interference of activities of daily living or interference of sleep. We discussed patient's condition and will continue hydrocodone acetaminophen at this time as well as remain available to consider patient for additional interventional treatment. We will continue hydrocodone acetaminophen at this time and we will remain available to consider additional interventional treatment as well as modifications of treatment regimen pending follow-up evaluation. All agreed with suggested treatment plan  Review of Systems     Objective:   Physical Exam  There was tenderness of the splenius capitis and occipitalis region a mild to moderate degree with mild to moderate tenderness of the cervical and thoracic paraspinal musculature region and thoracic and cervical facet regions. There was tenderness over the acromioclavicular and glenohumeral joint region a mild to moderate degree and patient appeared to be with unremarkable Spurling's maneuver. The patient appeared to be with bilaterally equal grip strength and Tinel and Phalen's maneuver were without increase of pain of significant degree Palpation over the thoracic region was attends to palpation of mild degree with lateral bending rotation extension and palpation of the lumbar facets reproducing mild to moderate discomfort. Straight leg raise was tolerated to 30 without increase of pain with dorsiflexion noted. Palpation over the PSIS and PII S region reproduced moderate  discomfort. No definite sensory deficit or dermatomal distribution detected. There was evidence of healing of the postherpetic lesions without significant pain at this time. There was negative clonus negative Homans. Abdomen was nontender with no costovertebral angle tenderness noted      Assessment & Plan:        Shingles (left lumbosacral region)  Sacroiliac joint dysfunction  Degenerative joint disease of shoulder  Degenerative disc disease lumbar spine Suspected acute compression fracture at L3, superiorly on the LEFT. No retropulsion.  Moderate multifactorial spinal stenosis is suspected at L2-3 and L3-4. Previous lumbar surgery at L4-5 with adequate posterior decompression. Incomplete arthrodesis with 4 mm anterolisthesis. Probable solid arthrodesis at L5-S1 with BILATERAL foraminal  narrowing Moderate involvement L3-4 and L4-L5 with broad-based disc bulge and facet arthropathy present at L2-3, L3-4, L4-5, and L5-S1Lumbar facet syndrome   Gluteal and piriformis neuralgia  Bursitis of shoulder  Degenerative joint disease of shoulder     PLAN   Continue present medication hydrocodone acetaminophen  F/U PCP Dr.Hande  for evaliation of  BP shingles  and general medical condition. Please see Dr.Hande this week for evaluation of elevated blood pressure as we discussed   F/U surgical evaluation. May consider pending follow-up evaluations  F/U neurological evaluation. May consider PNCV/EMG studies and other studies pending follow-up evaluations  May consider radiofrequency rhizolysis or intraspinal procedures pending response to present treatment and F/U evaluation   Patient is to call pain management prior to scheduled return appointment for any concern

## 2016-02-19 ENCOUNTER — Ambulatory Visit: Payer: Medicare Other | Admitting: Pain Medicine

## 2016-02-27 ENCOUNTER — Ambulatory Visit: Payer: Medicare Other | Admitting: Pain Medicine

## 2016-03-12 ENCOUNTER — Ambulatory Visit: Payer: Medicare Other | Attending: Pain Medicine | Admitting: Pain Medicine

## 2016-03-12 ENCOUNTER — Encounter: Payer: Self-pay | Admitting: Pain Medicine

## 2016-03-12 VITALS — BP 126/50 | HR 60 | Temp 98.0°F | Resp 18 | Ht 61.0 in | Wt 180.0 lb

## 2016-03-12 DIAGNOSIS — M19019 Primary osteoarthritis, unspecified shoulder: Secondary | ICD-10-CM | POA: Insufficient documentation

## 2016-03-12 DIAGNOSIS — M5416 Radiculopathy, lumbar region: Secondary | ICD-10-CM

## 2016-03-12 DIAGNOSIS — M79604 Pain in right leg: Secondary | ICD-10-CM | POA: Diagnosis present

## 2016-03-12 DIAGNOSIS — M5126 Other intervertebral disc displacement, lumbar region: Secondary | ICD-10-CM | POA: Diagnosis not present

## 2016-03-12 DIAGNOSIS — Z9889 Other specified postprocedural states: Secondary | ICD-10-CM | POA: Insufficient documentation

## 2016-03-12 DIAGNOSIS — M7060 Trochanteric bursitis, unspecified hip: Secondary | ICD-10-CM

## 2016-03-12 DIAGNOSIS — M545 Low back pain: Secondary | ICD-10-CM | POA: Diagnosis present

## 2016-03-12 DIAGNOSIS — M79605 Pain in left leg: Secondary | ICD-10-CM | POA: Diagnosis present

## 2016-03-12 DIAGNOSIS — M4316 Spondylolisthesis, lumbar region: Secondary | ICD-10-CM | POA: Diagnosis not present

## 2016-03-12 DIAGNOSIS — G588 Other specified mononeuropathies: Secondary | ICD-10-CM | POA: Diagnosis not present

## 2016-03-12 DIAGNOSIS — M5136 Other intervertebral disc degeneration, lumbar region: Secondary | ICD-10-CM | POA: Insufficient documentation

## 2016-03-12 DIAGNOSIS — M533 Sacrococcygeal disorders, not elsewhere classified: Secondary | ICD-10-CM | POA: Insufficient documentation

## 2016-03-12 DIAGNOSIS — M1288 Other specific arthropathies, not elsewhere classified, other specified site: Secondary | ICD-10-CM | POA: Diagnosis not present

## 2016-03-12 DIAGNOSIS — M7550 Bursitis of unspecified shoulder: Secondary | ICD-10-CM | POA: Insufficient documentation

## 2016-03-12 DIAGNOSIS — B029 Zoster without complications: Secondary | ICD-10-CM | POA: Insufficient documentation

## 2016-03-12 DIAGNOSIS — M47817 Spondylosis without myelopathy or radiculopathy, lumbosacral region: Secondary | ICD-10-CM

## 2016-03-12 DIAGNOSIS — M5481 Occipital neuralgia: Secondary | ICD-10-CM

## 2016-03-12 DIAGNOSIS — B0229 Other postherpetic nervous system involvement: Secondary | ICD-10-CM

## 2016-03-12 MED ORDER — HYDROCODONE-ACETAMINOPHEN 5-325 MG PO TABS: ORAL_TABLET | ORAL | 0 refills | Status: DC

## 2016-03-12 NOTE — Progress Notes (Signed)
     The patient is an 82 year old female who returns to pain management for further evaluation and treatment of pain involving the mid lower back and lower extremity regions. The patient admits to pain involving the back with radiation of pain towards the lower extremities especially the left lower extremities which was the lower extremity involvement with prior shingles. There is concern regarding postherpetic neuralgia treatment of patient's symptomatology in addition intraspinal and abnormalities of the lumbar region and turning to patient's symptoms. At the present time we will continue hydrocodone acetaminophen and remain available to consider additional modifications of treatment pending response to treatment and follow-up evaluation. All agreed to suggested treatment plan       Physical examination   There was tenderness to palpation of paraspinal musculature region cervical region cervical facet region of mild to moderate degree with mild to moderate tenderness of the acromioclavicular and glenohumeral joint regions. The patient was with unremarkable Spurling's maneuver. Palpation of the thoracic region was attends to palpation without crepitus of the thoracic region noted. Palpation over the lumbar region was increase of pain with lateral bending rotation extension and palpation over the lumbar facets reproducing moderate discomfort. Palpation of the knee was increase of pain with range of motion maneuvers of the knee with negative anterior and posterior drawer signs without ballottement of the patella. There was no definite sensory deficit of dermatomal distribution detected. There was negative clonus negative Homans. Abdomen nontender with no costovertebral tenderness noted       Assessment    Shingles (left lumbosacral region)  Sacroiliac joint dysfunction  Degenerative joint disease of shoulder  Degenerative disc disease lumbar spine Suspected acute compression fracture  at L3, superiorly on the LEFT. No retropulsion.  Moderate multifactorial spinal stenosis is suspected at L2-3 and L3-4. Previous lumbar surgery at L4-5 with adequate posterior decompression. Incomplete arthrodesis with 4 mm anterolisthesis. Probable solid arthrodesis at L5-S1 with BILATERAL foraminal  narrowing Moderate involvement L3-4 and L4-L5 with broad-based disc bulge and facet arthropathy present at L2-3, L3-4, L4-5, and L5-S1Lumbar facet syndrome   Gluteal and piriformis neuralgia  Bursitis of shoulder  Degenerative joint disease of shoulder       Continue present medication hydrocodone acetaminophen  Lumbosacral selective nerve root block to be performed at time of return appointment  F/U PCP Dr.Hande  for evaluation of  BP shingles  and general medical condition   F/U surgical evaluation. May consider pending follow-up evaluations  F/U neurological evaluation. May consider PNCV/EMG studies and other studies pending follow-up evaluations  May consider radiofrequency rhizolysis or intraspinal procedures pending response to present treatment and F/U evaluation   Patient to call pain management prior to scheduled return appointment for any concernS

## 2016-03-12 NOTE — Progress Notes (Signed)
Safety precautions to be maintained throughout the outpatient stay will include: orient to surroundings, keep bed in low position, maintain call bell within reach at all times, provide assistance with transfer out of bed and ambulation.  

## 2016-03-12 NOTE — Patient Instructions (Addendum)
PLAN   Continue present medication hydrocodone acetaminophen  Lumbosacral selective nerve root block to be performed at time of return appointment  F/U PCP Dr.Hande  for evaliation of  BP shingles  and general medical condition.   F/U surgical evaluation. May consider pending follow-up evaluations  F/U neurological evaluation. May consider PNCV/EMG studies and other studies pending follow-up evaluations  May consider radiofrequency rhizolysis or intraspinal procedures pending response to present treatment and F/U evaluation   Patient to call pain management prior to scheduled return appointment for any concernSelective Nerve Root Block Patient Information  Description: Specific nerve roots exit the spinal canal and these nerves can be compressed and inflamed by a bulging disc and bone spurs.  By injecting steroids on the nerve root, we can potentially decrease the inflammation surrounding these nerves, which often leads to decreased pain.  Also, by injecting local anesthesia on the nerve root, this can provide [place] helpful information to give to your referring doctor if it decreases your pain.  Selective nerve root blocks can be done along the spine from the neck to the low back depending on the location of your pain.   After numbing the skin with local anesthesia, a small needle is passed to the nerve root and the position of the needle is verified using x-ray pictures.  After the needle is in correct position, we then deposit the medication.  You may experience a pressure sensation while this is being done.  The entire block usually lasts less than 15 minutes.  Conditions that may be treated with selective nerve root blocks:  Low back and leg pain  Spinal stenosis  Diagnostic block prior to potential surgery  Neck and arm pain  Post laminectomy syndrome  Preparation for the injection:  1. Do not eat any solid food or dairy products within 8 hours of your appointment. 2. You may  drink clear liquids up to 3 hours before an appointment.  Clear liquids include water, black coffee, juice or soda.  No milk or cream please. 3. You may take your regular medications, including pain medications, with a sip of water before your appointment.  Diabetics should hold regular insulin (if taken separately) and take 1/2 normal NPH dose the morning of the procedure.  Carry some sugar containing items with you to your appointment. 4. A driver must accompany you and be prepared to drive you home after your procedure. 5. Bring all your current medications with you. 6. An IV may be inserted and sedation may be given at the discretion of the physician. 7. A blood pressure cuff, EKG, and other monitors will often be applied during the procedure.  Some patients may need to have extra oxygen administered for a short period. 8. You will be asked to provide medical information, including allergies, prior to the procedure.  We must know immediately if you are taking blood  Thinners (like Coumadin) or if you are allergic to IV iodine contrast (dye).  Possible side-effects: All are usually temporary  Bleeding from needle site  Light headedness  Numbness and tingling  Decreased blood pressure  Weakness in arms/legs  Pressure sensation in back/neck  Pain at injection site (several days)  Possible complications: All are extremely rare  Infection  Nerve injury  Spinal headache (a headache wore with upright position)  Call if you experience:  Fever/chills associated with headache or increased back/neck pain  Headache worsened by an upright position  New onset weakness or numbness of an extremity below the  injection site  Hives or difficulty breathing (go to the emergency room)  Inflammation or drainage at the injection site(s)  Severe back/neck pain greater than usual  New symptoms which are concerning to you  Please note:  Although the local anesthetic injected can often  make your back or neck feel good for several hours after the injection the pain will likely return.  It takes 3-5 days for steroids to work on the nerve root. You may not notice any pain relief for at least one week.  If effective, we will often do a series of 3 injections spaced 3-6 weeks apart to maximally decrease your pain.    If you have any questions, please call [phone] [facility] Clinic[facility]AL RISKS AND COMPLICATIONS  What are the risk, side effects and possible complications? Generally speaking, most procedures are safe.  However, with any procedure there are risks, side effects, and the possibility of complications.  The risks and complications are dependent upon the sites that are lesioned, or the type of nerve block to be performed.  The closer the procedure is to the spine, the more serious the risks are.  Great care is taken when placing the radio frequency needles, block needles or lesioning probes, but sometimes complications can occur. 1. Infection: Any time there is an injection through the skin, there is a risk of infection.  This is why sterile conditions are used for these blocks.  There are four possible types of infection. 1. Localized skin infection. 2. Central Nervous System Infection-This can be in the form of Meningitis, which can be deadly. 3. Epidural Infections-This can be in the form of an epidural abscess, which can cause pressure inside of the spine, causing compression of the spinal cord with subsequent paralysis. This would require an emergency surgery to decompress, and there are no guarantees that the patient would recover from the paralysis. 4. Discitis-This is an infection of the intervertebral discs.  It occurs in about 1% of discography procedures.  It is difficult to treat and it may lead to surgery.        2. Pain: the needles have to go through skin and soft tissues, will cause soreness.       3. Damage to internal  structures:  The nerves to be lesioned may be near blood vessels or    other nerves which can be potentially damaged.       4. Bleeding: Bleeding is more common if the patient is taking blood thinners such as  aspirin, Coumadin, Ticiid, Plavix, etc., or if he/she have some genetic predisposition  such as hemophilia. Bleeding into the spinal canal can cause compression of the spinal  cord with subsequent paralysis.  This would require an emergency surgery to  decompress and there are no guarantees that the patient would recover from the  paralysis.       5. Pneumothorax:  Puncturing of a lung is a possibility, every time a needle is introduced in  the area of the chest or upper back.  Pneumothorax refers to free air around the  collapsed lung(s), inside of the thoracic cavity (chest cavity).  Another two possible  complications related to a similar event would include: Hemothorax and Chylothorax.   These are variations of the Pneumothorax, where instead of air around the collapsed  lung(s), you may have blood or chyle, respectively.       6. Spinal headaches: They may occur with any procedures in the area of the  spine.       7. Persistent CSF (Cerebro-Spinal Fluid) leakage: This is a rare problem, but may occur  with prolonged intrathecal or epidural catheters either due to the formation of a fistulous  track or a dural tear.       8. Nerve damage: By working so close to the spinal cord, there is always a possibility of  nerve damage, which could be as serious as a permanent spinal cord injury with  paralysis.       9. Death:  Although rare, severe deadly allergic reactions known as "Anaphylactic  reaction" can occur to any of the medications used.      10. Worsening of the symptoms:  We can always make thing worse.  What are the chances of something like this happening? Chances of any of this occuring are extremely low.  By statistics, you have more of a chance of getting killed in a motor vehicle  accident: while driving to the hospital than any of the above occurring .  Nevertheless, you should be aware that they are possibilities.  In general, it is similar to taking a shower.  Everybody knows that you can slip, hit your head and get killed.  Does that mean that you should not shower again?  Nevertheless always keep in mind that statistics do not mean anything if you happen to be on the wrong side of them.  Even if a procedure has a 1 (one) in a 1,000,000 (million) chance of going wrong, it you happen to be that one..Also, keep in mind that by statistics, you have more of a chance of having something go wrong when taking medications.  Who should not have this procedure? If you are on a blood thinning medication (e.g. Coumadin, Plavix, see list of "Blood Thinners"), or if you have an active infection going on, you should not have the procedure.  If you are taking any blood thinners, please inform your physician.  How should I prepare for this procedure?  Do not eat or drink anything at least six hours prior to the procedure.  Bring a driver with you .  It cannot be a taxi.  Come accompanied by an adult that can drive you back, and that is strong enough to help you if your legs get weak or numb from the local anesthetic.  Take all of your medicines the morning of the procedure with just enough water to swallow them.  If you have diabetes, make sure that you are scheduled to have your procedure done first thing in the morning, whenever possible.  If you have diabetes, take only half of your insulin dose and notify our nurse that you have done so as soon as you arrive at the clinic.  If you are diabetic, but only take blood sugar pills (oral hypoglycemic), then do not take them on the morning of your procedure.  You may take them after you have had the procedure.  Do not take aspirin or any aspirin-containing medications, at least eleven (11) days prior to the procedure.  They may prolong  bleeding.  Wear loose fitting clothing that may be easy to take off and that you would not mind if it got stained with Betadine or blood.  Do not wear any jewelry or perfume  Remove any nail coloring.  It will interfere with some of our monitoring equipment.  NOTE: Remember that this is not meant to be interpreted as a complete list of all possible complications.  Unforeseen problems may occur.  BLOOD THINNERS The following drugs contain aspirin or other products, which can cause increased bleeding during surgery and should not be taken for 2 weeks prior to and 1 week after surgery.  If you should need take something for relief of minor pain, you may take acetaminophen which is found in Tylenol,m Datril, Anacin-3 and Panadol. It is not blood thinner. The products listed below are.  Do not take any of the products listed below in addition to any listed on your instruction sheet.  A.P.C or A.P.C with Codeine Codeine Phosphate Capsules #3 Ibuprofen Ridaura  ABC compound Congesprin Imuran rimadil  Advil Cope Indocin Robaxisal  Alka-Seltzer Effervescent Pain Reliever and Antacid Coricidin or Coricidin-D  Indomethacin Rufen  Alka-Seltzer plus Cold Medicine Cosprin Ketoprofen S-A-C Tablets  Anacin Analgesic Tablets or Capsules Coumadin Korlgesic Salflex  Anacin Extra Strength Analgesic tablets or capsules CP-2 Tablets Lanoril Salicylate  Anaprox Cuprimine Capsules Levenox Salocol  Anexsia-D Dalteparin Magan Salsalate  Anodynos Darvon compound Magnesium Salicylate Sine-off  Ansaid Dasin Capsules Magsal Sodium Salicylate  Anturane Depen Capsules Marnal Soma  APF Arthritis pain formula Dewitt's Pills Measurin Stanback  Argesic Dia-Gesic Meclofenamic Sulfinpyrazone  Arthritis Bayer Timed Release Aspirin Diclofenac Meclomen Sulindac  Arthritis pain formula Anacin Dicumarol Medipren Supac  Analgesic (Safety coated) Arthralgen Diffunasal Mefanamic Suprofen  Arthritis Strength Bufferin Dihydrocodeine  Mepro Compound Suprol  Arthropan liquid Dopirydamole Methcarbomol with Aspirin Synalgos  ASA tablets/Enseals Disalcid Micrainin Tagament  Ascriptin Doan's Midol Talwin  Ascriptin A/D Dolene Mobidin Tanderil  Ascriptin Extra Strength Dolobid Moblgesic Ticlid  Ascriptin with Codeine Doloprin or Doloprin with Codeine Momentum Tolectin  Asperbuf Duoprin Mono-gesic Trendar  Aspergum Duradyne Motrin or Motrin IB Triminicin  Aspirin plain, buffered or enteric coated Durasal Myochrisine Trigesic  Aspirin Suppositories Easprin Nalfon Trillsate  Aspirin with Codeine Ecotrin Regular or Extra Strength Naprosyn Uracel  Atromid-S Efficin Naproxen Ursinus  Auranofin Capsules Elmiron Neocylate Vanquish  Axotal Emagrin Norgesic Verin  Azathioprine Empirin or Empirin with Codeine Normiflo Vitamin E  Azolid Emprazil Nuprin Voltaren  Bayer Aspirin plain, buffered or children's or timed BC Tablets or powders Encaprin Orgaran Warfarin Sodium  Buff-a-Comp Enoxaparin Orudis Zorpin  Buff-a-Comp with Codeine Equegesic Os-Cal-Gesic   Buffaprin Excedrin plain, buffered or Extra Strength Oxalid   Bufferin Arthritis Strength Feldene Oxphenbutazone   Bufferin plain or Extra Strength Feldene Capsules Oxycodone with Aspirin   Bufferin with Codeine Fenoprofen Fenoprofen Pabalate or Pabalate-SF   Buffets II Flogesic Panagesic   Buffinol plain or Extra Strength Florinal or Florinal with Codeine Panwarfarin   Buf-Tabs Flurbiprofen Penicillamine   Butalbital Compound Four-way cold tablets Penicillin   Butazolidin Fragmin Pepto-Bismol   Carbenicillin Geminisyn Percodan   Carna Arthritis Reliever Geopen Persantine   Carprofen Gold's salt Persistin   Chloramphenicol Goody's Phenylbutazone   Chloromycetin Haltrain Piroxlcam   Clmetidine heparin Plaquenil   Cllnoril Hyco-pap Ponstel   Clofibrate Hydroxy chloroquine Propoxyphen         Before stopping any of these medications, be sure to consult the physician who ordered  them.  Some, such as Coumadin (Warfarin) are ordered to prevent or treat serious conditions such as "deep thrombosis", "pumonary embolisms", and other heart problems.  The amount of time that you may need off of the medication may also vary with the medication and the reason for which you were taking it.  If you are taking any of these medications, please make sure you notify your pain physician before you undergo any procedures.

## 2016-03-23 ENCOUNTER — Ambulatory Visit: Payer: Medicare Other | Attending: Pain Medicine | Admitting: Pain Medicine

## 2016-03-23 ENCOUNTER — Encounter: Payer: Self-pay | Admitting: Pain Medicine

## 2016-03-23 VITALS — BP 156/72 | HR 71 | Temp 97.4°F | Resp 16 | Ht 61.0 in | Wt 180.0 lb

## 2016-03-23 DIAGNOSIS — M79606 Pain in leg, unspecified: Secondary | ICD-10-CM | POA: Insufficient documentation

## 2016-03-23 DIAGNOSIS — M545 Low back pain: Secondary | ICD-10-CM | POA: Diagnosis not present

## 2016-03-23 DIAGNOSIS — M47817 Spondylosis without myelopathy or radiculopathy, lumbosacral region: Secondary | ICD-10-CM

## 2016-03-23 DIAGNOSIS — B0229 Other postherpetic nervous system involvement: Secondary | ICD-10-CM

## 2016-03-23 DIAGNOSIS — M48062 Spinal stenosis, lumbar region with neurogenic claudication: Secondary | ICD-10-CM | POA: Insufficient documentation

## 2016-03-23 DIAGNOSIS — M5481 Occipital neuralgia: Secondary | ICD-10-CM

## 2016-03-23 DIAGNOSIS — M5416 Radiculopathy, lumbar region: Secondary | ICD-10-CM

## 2016-03-23 DIAGNOSIS — M4316 Spondylolisthesis, lumbar region: Secondary | ICD-10-CM | POA: Insufficient documentation

## 2016-03-23 DIAGNOSIS — M5136 Other intervertebral disc degeneration, lumbar region: Secondary | ICD-10-CM

## 2016-03-23 DIAGNOSIS — M533 Sacrococcygeal disorders, not elsewhere classified: Secondary | ICD-10-CM

## 2016-03-23 DIAGNOSIS — M7060 Trochanteric bursitis, unspecified hip: Secondary | ICD-10-CM

## 2016-03-23 MED ORDER — BUPIVACAINE HCL (PF) 0.25 % IJ SOLN
30.0000 mL | Freq: Once | INTRAMUSCULAR | Status: DC
  Filled 2016-03-23: qty 30

## 2016-03-23 MED ORDER — LACTATED RINGERS IV SOLN: 1000.0000 mL | INTRAVENOUS | Status: DC

## 2016-03-23 MED ORDER — MIDAZOLAM HCL 5 MG/5ML IJ SOLN
5.0000 mg | Freq: Once | INTRAMUSCULAR | Status: AC
  Administered 2016-03-23: 3 mg via INTRAVENOUS
  Filled 2016-03-23: qty 5

## 2016-03-23 MED ORDER — CIPROFLOXACIN HCL 250 MG PO TABS: 250.0000 mg | ORAL_TABLET | Freq: Two times a day (BID) | ORAL | 0 refills | Status: DC

## 2016-03-23 MED ORDER — SODIUM CHLORIDE 0.9% FLUSH: 20.0000 mL | Freq: Once | INTRAVENOUS | Status: DC

## 2016-03-23 MED ORDER — ORPHENADRINE CITRATE 30 MG/ML IJ SOLN
60.0000 mg | Freq: Once | INTRAMUSCULAR | Status: AC
  Administered 2016-03-23: 60 mg via INTRAMUSCULAR
  Filled 2016-03-23: qty 2

## 2016-03-23 MED ORDER — LIDOCAINE HCL (PF) 1 % IJ SOLN: 10.0000 mL | Freq: Once | INTRAMUSCULAR | Status: DC

## 2016-03-23 MED ORDER — TRIAMCINOLONE ACETONIDE 40 MG/ML IJ SUSP
40.0000 mg | Freq: Once | INTRAMUSCULAR | Status: AC
  Administered 2016-03-23: 40 mg
  Filled 2016-03-23: qty 1

## 2016-03-23 MED ORDER — FENTANYL CITRATE (PF) 100 MCG/2ML IJ SOLN
100.0000 ug | Freq: Once | INTRAMUSCULAR | Status: AC
  Administered 2016-03-23: 50 ug via INTRAVENOUS
  Filled 2016-03-23: qty 2

## 2016-03-23 MED ORDER — CIPROFLOXACIN IN D5W 400 MG/200ML IV SOLN
400.0000 mg | Freq: Once | INTRAVENOUS | Status: AC
  Administered 2016-03-23: 400 mg via INTRAVENOUS
  Filled 2016-03-23: qty 200

## 2016-03-23 NOTE — Progress Notes (Signed)
Dr. Primus Bravo aware of elevated BP in procedure room.

## 2016-03-23 NOTE — Progress Notes (Signed)
Patient here today for procedure visit.   Safety precautions to be maintained throughout the outpatient stay will include: orient to surroundings, keep bed in low position, maintain call bell within reach at all times, provide assistance with transfer out of bed and ambulation.  

## 2016-03-23 NOTE — Progress Notes (Signed)
PROCEDURE PERFORMED: Left Lumbosacral selective nerve root blocks  NOTE: The patient is a 82 y.o. female who returns to [place] for further evaluation and treatment of pain involving the lumbar and lower extremity region. Studies consisting of MRI has revealed the patient to be with evidence of Moderate multifactorial spinal stenosis is suspected at L2-3 and L3-4.Previous lumbar surgery at L4-5 with adequate posterior decompression. Incomplete arthrodesis with 4 mm anterolisthesis. Probable solid arthrodesis at L5-S1 with BILATERAL foraminal  narrowing Moderate involvement L3-4 and L4-L5 with broad-based disc bulge and facet arthropathy present at L2-3, L3-4, L4-5, and L5-S1Lumbar facet syndrome . There is concern regarding intraspinal abnormalities contributing to the patient's symptomatology with concern regarding component of pain being due to lumbar radiculopathy as well as concern regarding component of pain due to postherpetic neuralgia. The risks, benefits, and expectations of the procedure have been explained to the patient who was understanding and in agreement with suggested treatment plan. We will proceed with interventional treatment as discussed and as explained to the patient. The patient is understanding and in agreement with suggested treatment plan.   DESCRIPTION OF PROCEDURE: Lumbosacral selective nerve root block with IV Versed, IV fentanyl conscious sedation, EKG, blood pressure, pulse, capnography, and pulse oximetry monitoring. The procedure was performed with the patient in the prone position under fluoroscopic guidance. With the patient in the prone position, Betadine prep of proposed entry site was performed. Local anesthetic skin wheal of proposed needle entry site was prepared with 1.5% plain lidocaine with AP view of the lumbosacral spine.   PROCEDURE #1: Needle placement at the left L 2 vertebral body: A 22 -gauge needle was inserted at the inferior border  of the transverse process of the vertebral body with needle placed medial to the midline of the transverse process on AP view of the lumbosacral spine.   NEEDLE PLACEMENT AT  L3, L4, and L5  VERTEBRAL BODY LEVELS  Needle  placement was accomplished at L3, L4, and L5  vertebral body levels on the left side exactly as was accomplished at the L2  vertebral body level  and utilizing the same technique and under fluoroscopic guidance.   Needle placement was then verified on lateral view at all levels with needle tip documented to be in the posterior superior quadrant of the intervertebral foramen of  L 2, L3, L4, and L5. Following negative aspiration for heme and CSF at each level, each level was injected with 3 mL of 0.25% bupivacaine with Kenalog.   LUMBOSACRAL SELECTIVE NERVE ROOT BLOCKS THE THE  RIGHT SIDE  The procedure was performed on the right side exactly as was performed on the left side and at the same levels  Under fluoroscopic guidance and utilizing the same technique.    The patient tolerated the procedure well. A total of 10 mg of Kenalog was utilized for the procedure.   PLAN:  1. Medications: Will continue presently prescribed medication hydrocodone acetaminophen. 2. The patient is to undergo follow-up evaluation with PCP Dr. Ginette Pitman for evaluation of blood pressure and general medical condition status post procedure performed on today's visit. 3. Surgical follow-up evaluation.Has been addressed  4. Neurological evaluation.May consider PNCV EMG studies and other studies  5. May consider radiofrequency procedures, implantation type procedures and other treatment pending response to treatment and follow-up evaluation. 6. The patient has been advised do adhere to proper body mechanics and avoid activities which may aggravate condition. 7. The patient has been advised to call the [facility] prior to scheduled return appointment should there be significant change in the  patient's condition or should the patient have other concerns regarding condition prior to scheduled return appointment.

## 2016-03-23 NOTE — Patient Instructions (Addendum)
PLAN   Continue present medication hydrocodone acetaminophen Please get Cipro antibiotic today and begin taking Cipro antibiotic today   F/U PCP Dr.Hande  for evaliation of  BP and general medical  condition .   F/U surgical evaluation. May consider pending follow-up evaluations  F/U  Dr Rudene Christians as discussed  F/U neurological evaluation. May consider PNCV/EMG studies and other studies pending follow-up evaluations  May consider radiofrequency rhizolysis or intraspinal procedures pending response to present treatment and F/U evaluation   Patient to call [facility] Management Center should patient have concerns prior to scheduled return appointmentPain Management Discharge Instructions  General Discharge Instructions :  If you need to reach your doctor call: Monday-Friday 8:00 am - 4:00 pm at [phone] or toll free [phone].  After clinic hours [phone] to have operator reach doctor.  Bring all of your medication bottles to all your appointments in the pain clinic.  To cancel or reschedule your appointment with Pain Management please remember to call 24 hours in advance to avoid a fee.  Refer to the educational materials which you have been given on: General Risks, I had my Procedure. Discharge Instructions, Post Sedation.  Post Procedure Instructions:  The drugs you were given will stay in your system until tomorrow, so for the next 24 hours you should not drive, make any legal decisions or drink any alcoholic beverages.  You may eat anything you prefer, but it is better to start with liquids then soups and crackers, and gradually work up to solid foods.  Please notify your doctor immediately if you have any unusual bleeding, trouble breathing or pain that is not related to your normal pain.  Depending on the type of procedure that was done, some parts of your body may feel week and/or numb.  This usually clears up by tonight or the next day.  Walk with the use of an assistive  device or accompanied by an adult for the 24 hours.  You may use ice on the affected area for the first 24 hours.  Put ice in a Ziploc bag and cover with a towel and place against area 15 minutes on 15 minutes off.  You may switch to heat after 24 hours.GENERAL RISKS AND COMPLICATIONS  What are the risk, side effects and possible complications? Generally speaking, most procedures are safe.  However, with any procedure there are risks, side effects, and the possibility of complications.  The risks and complications are dependent upon the sites that are lesioned, or the type of nerve block to be performed.  The closer the procedure is to the spine, the more serious the risks are.  Great care is taken when placing the radio frequency needles, block needles or lesioning probes, but sometimes complications can occur. 1. Infection: Any time there is an injection through the skin, there is a risk of infection.  This is why sterile conditions are used for these blocks.  There are four possible types of infection. 1. Localized skin infection. 2. Central Nervous System Infection-This can be in the form of Meningitis, which can be deadly. 3. Epidural Infections-This can be in the form of an epidural abscess, which can cause pressure inside of the spine, causing compression of the spinal cord with subsequent paralysis. This would require an emergency surgery to decompress, and there are no guarantees that the patient would recover from the paralysis. 4. Discitis-This is an infection of the intervertebral discs.  It occurs in about 1% of discography procedures.  It is difficult to  treat and it may lead to surgery.        2. Pain: the needles have to go through skin and soft tissues, will cause soreness.       3. Damage to internal structures:  The nerves to be lesioned may be near blood vessels or    other nerves which can be potentially damaged.       4. Bleeding: Bleeding is more common if the patient is taking  blood thinners such as  aspirin, Coumadin, Ticiid, Plavix, etc., or if he/she have some genetic predisposition  such as hemophilia. Bleeding into the spinal canal can cause compression of the spinal  cord with subsequent paralysis.  This would require an emergency surgery to  decompress and there are no guarantees that the patient would recover from the  paralysis.       5. Pneumothorax:  Puncturing of a lung is a possibility, every time a needle is introduced in  the area of the chest or upper back.  Pneumothorax refers to free air around the  collapsed lung(s), inside of the thoracic cavity (chest cavity).  Another two possible  complications related to a similar event would include: Hemothorax and Chylothorax.   These are variations of the Pneumothorax, where instead of air around the collapsed  lung(s), you may have blood or chyle, respectively.       6. Spinal headaches: They may occur with any procedures in the area of the spine.       7. Persistent CSF (Cerebro-Spinal Fluid) leakage: This is a rare problem, but may occur  with prolonged intrathecal or epidural catheters either due to the formation of a fistulous  track or a dural tear.       8. Nerve damage: By working so close to the spinal cord, there is always a possibility of  nerve damage, which could be as serious as a permanent spinal cord injury with  paralysis.       9. Death:  Although rare, severe deadly allergic reactions known as "Anaphylactic  reaction" can occur to any of the medications used.      10. Worsening of the symptoms:  We can always make thing worse.  What are the chances of something like this happening? Chances of any of this occuring are extremely low.  By statistics, you have more of a chance of getting killed in a motor vehicle accident: while driving to the hospital than any of the above occurring .  Nevertheless, you should be aware that they are possibilities.  In general, it is similar to taking a shower.  Everybody  knows that you can slip, hit your head and get killed.  Does that mean that you should not shower again?  Nevertheless always keep in mind that statistics do not mean anything if you happen to be on the wrong side of them.  Even if a procedure has a 1 (one) in a 1,000,000 (million) chance of going wrong, it you happen to be that one..Also, keep in mind that by statistics, you have more of a chance of having something go wrong when taking medications.  Who should not have this procedure? If you are on a blood thinning medication (e.g. Coumadin, Plavix, see list of "Blood Thinners"), or if you have an active infection going on, you should not have the procedure.  If you are taking any blood thinners, please inform your physician.  How should I prepare for this procedure?  Do not eat or  drink anything at least six hours prior to the procedure.  Bring a driver with you .  It cannot be a taxi.  Come accompanied by an adult that can drive you back, and that is strong enough to help you if your legs get weak or numb from the local anesthetic.  Take all of your medicines the morning of the procedure with just enough water to swallow them.  If you have diabetes, make sure that you are scheduled to have your procedure done first thing in the morning, whenever possible.  If you have diabetes, take only half of your insulin dose and notify our nurse that you have done so as soon as you arrive at the clinic.  If you are diabetic, but only take blood sugar pills (oral hypoglycemic), then do not take them on the morning of your procedure.  You may take them after you have had the procedure.  Do not take aspirin or any aspirin-containing medications, at least eleven (11) days prior to the procedure.  They may prolong bleeding.  Wear loose fitting clothing that may be easy to take off and that you would not mind if it got stained with Betadine or blood.  Do not wear any jewelry or perfume  Remove any nail  coloring.  It will interfere with some of our monitoring equipment.  NOTE: Remember that this is not meant to be interpreted as a complete list of all possible complications.  Unforeseen problems may occur.  BLOOD THINNERS The following drugs contain aspirin or other products, which can cause increased bleeding during surgery and should not be taken for 2 weeks prior to and 1 week after surgery.  If you should need take something for relief of minor pain, you may take acetaminophen which is found in Tylenol,m Datril, Anacin-3 and Panadol. It is not blood thinner. The products listed below are.  Do not take any of the products listed below in addition to any listed on your instruction sheet.  A.P.C or A.P.C with Codeine Codeine Phosphate Capsules #3 Ibuprofen Ridaura  ABC compound Congesprin Imuran rimadil  Advil Cope Indocin Robaxisal  Alka-Seltzer Effervescent Pain Reliever and Antacid Coricidin or Coricidin-D  Indomethacin Rufen  Alka-Seltzer plus Cold Medicine Cosprin Ketoprofen S-A-C Tablets  Anacin Analgesic Tablets or Capsules Coumadin Korlgesic Salflex  Anacin Extra Strength Analgesic tablets or capsules CP-2 Tablets Lanoril Salicylate  Anaprox Cuprimine Capsules Levenox Salocol  Anexsia-D Dalteparin Magan Salsalate  Anodynos Darvon compound Magnesium Salicylate Sine-off  Ansaid Dasin Capsules Magsal Sodium Salicylate  Anturane Depen Capsules Marnal Soma  APF Arthritis pain formula Dewitt's Pills Measurin Stanback  Argesic Dia-Gesic Meclofenamic Sulfinpyrazone  Arthritis Bayer Timed Release Aspirin Diclofenac Meclomen Sulindac  Arthritis pain formula Anacin Dicumarol Medipren Supac  Analgesic (Safety coated) Arthralgen Diffunasal Mefanamic Suprofen  Arthritis Strength Bufferin Dihydrocodeine Mepro Compound Suprol  Arthropan liquid Dopirydamole Methcarbomol with Aspirin Synalgos  ASA tablets/Enseals Disalcid Micrainin Tagament  Ascriptin Doan's Midol Talwin  Ascriptin A/D Dolene  Mobidin Tanderil  Ascriptin Extra Strength Dolobid Moblgesic Ticlid  Ascriptin with Codeine Doloprin or Doloprin with Codeine Momentum Tolectin  Asperbuf Duoprin Mono-gesic Trendar  Aspergum Duradyne Motrin or Motrin IB Triminicin  Aspirin plain, buffered or enteric coated Durasal Myochrisine Trigesic  Aspirin Suppositories Easprin Nalfon Trillsate  Aspirin with Codeine Ecotrin Regular or Extra Strength Naprosyn Uracel  Atromid-S Efficin Naproxen Ursinus  Auranofin Capsules Elmiron Neocylate Vanquish  Axotal Emagrin Norgesic Verin  Azathioprine Empirin or Empirin with Codeine Normiflo Vitamin E  Azolid Emprazil Nuprin  Voltaren  Bayer Aspirin plain, buffered or children's or timed BC Tablets or powders Encaprin Orgaran Warfarin Sodium  Buff-a-Comp Enoxaparin Orudis Zorpin  Buff-a-Comp with Codeine Equegesic Os-Cal-Gesic   Buffaprin Excedrin plain, buffered or Extra Strength Oxalid   Bufferin Arthritis Strength Feldene Oxphenbutazone   Bufferin plain or Extra Strength Feldene Capsules Oxycodone with Aspirin   Bufferin with Codeine Fenoprofen Fenoprofen Pabalate or Pabalate-SF   Buffets II Flogesic Panagesic   Buffinol plain or Extra Strength Florinal or Florinal with Codeine Panwarfarin   Buf-Tabs Flurbiprofen Penicillamine   Butalbital Compound Four-way cold tablets Penicillin   Butazolidin Fragmin Pepto-Bismol   Carbenicillin Geminisyn Percodan   Carna Arthritis Reliever Geopen Persantine   Carprofen Gold's salt Persistin   Chloramphenicol Goody's Phenylbutazone   Chloromycetin Haltrain Piroxlcam   Clmetidine heparin Plaquenil   Cllnoril Hyco-pap Ponstel   Clofibrate Hydroxy chloroquine Propoxyphen         Before stopping any of these medications, be sure to consult the physician who ordered them.  Some, such as Coumadin (Warfarin) are ordered to prevent or treat serious conditions such as "deep thrombosis", "pumonary embolisms", and other heart problems.  The amount of time that  you may need off of the medication may also vary with the medication and the reason for which you were taking it.  If you are taking any of these medications, please make sure you notify your pain physician before you undergo any procedures.

## 2016-03-24 ENCOUNTER — Telehealth: Payer: Self-pay | Admitting: *Deleted

## 2016-03-24 NOTE — Telephone Encounter (Signed)
No problems post procedure. 

## 2016-04-07 DIAGNOSIS — C50411 Malignant neoplasm of upper-outer quadrant of right female breast: Secondary | ICD-10-CM | POA: Insufficient documentation

## 2016-04-07 NOTE — Progress Notes (Signed)
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  INTERVAL HISTORY:  Patient returns to clinic today for routine six-month follow-up. She has now completed 5 years of an aromatase inhibitor. She currently feels well and is asymptomatic. She has no neurologic complaints. She denies any recent fevers. She has good appetite and denies weight loss. She denies any chest pain or shortness of breath.  She denies any nausea, vomiting, constipation , or diarrhea. She has no urinary complaints. Patient feels at her baseline and offers no specific complaints today.  REVIEW OF SYSTEMS:   Review of Systems  Constitutional: Negative.  Negative for fever, malaise/fatigue and weight loss.  Respiratory: Negative.  Negative for cough and shortness of breath.   Cardiovascular: Negative.  Negative for chest pain and leg swelling.  Gastrointestinal: Negative.  Negative for abdominal pain.  Genitourinary: Negative.   Musculoskeletal: Positive for back pain and joint pain.  Neurological: Negative.  Negative for weakness.  Psychiatric/Behavioral: Negative.     As per HPI. Otherwise, a complete review of systems is negatve.  PAST MEDICAL HISTORY: Past Medical History:  Diagnosis Date  . Asthma   . Bronchitis 04/2015  . Cancer [facility]) 2011   breast  . COPD (chronic obstructive pulmonary disease) ([place])   . Hypertension   . Shingles    10/2015    PAST SURGICAL HISTORY: Past Surgical History:  Procedure Laterality Date  . BREAST EXCISIONAL BIOPSY Right 11/29/13   two areas  . BREAST EXCISIONAL BIOPSY Right 10/30/2009   lumpectomy - radiation    FAMILY HISTORY Family History  Problem Relation Age of Onset  .  Hypertension Mother   . Heart disease Mother   . Cancer Mother   . Stroke Father   . Hypertension Father   . Breast cancer Sister        ADVANCED DIRECTIVES:    HEALTH MAINTENANCE: Social History  Substance Use Topics  . Smoking status: Never Smoker  . Smokeless tobacco: Never Used  . Alcohol use No     Colonoscopy:  PAP:  Bone density:  Lipid panel:  Allergies  Allergen Reactions  . Penicillins Hives  . Captopril Other (See Comments)  . Enalapril Maleate     Other reaction(s): Unknown  . Seldane  [Terfenadine] Other (See Comments)  . Tape Itching  . Augmentin [Amoxicillin-Pot Clavulanate] Rash  . Biaxin [Clarithromycin] Rash, Other (See Comments) and Hives    Other reaction(s): Unknown    Current Outpatient Prescriptions  Medication Sig Dispense Refill  . ALPRAZolam (XANAX) 0.25 MG tablet Take 0.25 mg by mouth 2 (two) times daily.    Marland Kitchen aspirin 81 MG tablet Take 81 mg by mouth 2 (two) times daily.     Marland Kitchen atorvastatin (LIPITOR) 20 MG tablet Take 20 mg by mouth daily.    . Cholecalciferol 1000 UNIT/10ML LIQD Take 1,000 Int'l Units/day by mouth every morning. Reported on 01/20/2016    . esomeprazole (NEXIUM) 40 MG capsule Take 40 mg by mouth daily at 12 noon.    . fexofenadine (ALLEGRA) 180 MG tablet Take 180 mg by mouth daily.    . fluticasone (FLONASE) 50 MCG/ACT nasal spray Place into both nostrils daily.    . Fluticasone-Salmeterol (ADVAIR) 100-50 MCG/DOSE AEPB Inhale 1  puff into the lungs 2 (two) times daily.    . hydrALAZINE (APRESOLINE) 25 MG tablet Take 50 mg by mouth 3 (three) times daily.     Marland Kitchen HYDROcodone-acetaminophen (NORCO/VICODIN) 5-325 MG tablet Limit 1 tab by mouth 2-3 times per day if tolerated 80 tablet 0  . ipratropium (ATROVENT) 0.02 % nebulizer solution Take 0.5 mg by nebulization 4 (four) times daily as needed.     Marland Kitchen levothyroxine (SYNTHROID, LEVOTHROID) 25 MCG tablet Take 25 mcg by mouth daily before breakfast.    . meloxicam (MOBIC) 15 MG tablet  Take 15 mg by mouth as needed for pain.    . metoprolol (LOPRESSOR) 50 MG tablet Take 50 mg by mouth 2 (two) times daily.    Marland Kitchen tiZANidine (ZANAFLEX) 2 MG tablet Take by mouth 3 (three) times daily. prn for muscle spasm     Current Facility-Administered Medications  Medication Dose Route Frequency Provider Last Rate Last Dose  . bupivacaine (PF) (MARCAINE) 0.25 % injection 30 mL  30 mL Other Once Mohammed Kindle, MD      . bupivacaine (PF) (MARCAINE) 0.25 % injection 30 mL  30 mL Other Once Mohammed Kindle, MD      . ciprofloxacin (CIPRO) IVPB 400 mg  400 mg Intravenous Once Mohammed Kindle, MD      . fentaNYL (SUBLIMAZE) injection 100 mcg  100 mcg Intravenous Once Mohammed Kindle, MD      . lactated ringers infusion 1,000 mL  1,000 mL Intravenous Continuous Mohammed Kindle, MD      . lactated ringers infusion 1,000 mL  1,000 mL Intravenous Continuous Mohammed Kindle, MD      . lactated ringers infusion 1,000 mL  1,000 mL Intravenous Continuous Mohammed Kindle, MD      . lactated ringers infusion 1,000 mL  1,000 mL Intravenous Continuous Mohammed Kindle, MD      . lactated ringers infusion 1,000 mL  1,000 mL Intravenous Continuous Mohammed Kindle, MD 125 mL/hr at 02/03/16 1058 1,000 mL at 02/03/16 1058  . lactated ringers infusion 1,000 mL  1,000 mL Intravenous Continuous Mohammed Kindle, MD      . lidocaine (PF) (XYLOCAINE) 1 % injection 10 mL  10 mL Subcutaneous Once Mohammed Kindle, MD      . midazolam (VERSED) 5 MG/5ML injection 5 mg  5 mg Intravenous Once Mohammed Kindle, MD      . midazolam (VERSED) 5 MG/5ML injection 5 mg  5 mg Intravenous Once Mohammed Kindle, MD      . orphenadrine (NORFLEX) injection 60 mg  60 mg Intramuscular Once Mohammed Kindle, MD      . orphenadrine (NORFLEX) injection 60 mg  60 mg Intramuscular Once Mohammed Kindle, MD      . orphenadrine (NORFLEX) injection 60 mg  60 mg Intramuscular Once Mohammed Kindle, MD      . sodium chloride 0.9 % injection 20 mL  20 mL Other Once Mohammed Kindle, MD        . sodium chloride 0.9 % injection 20 mL  20 mL Other Once Mohammed Kindle, MD      . sodium chloride flush (NS) 0.9 % injection 20 mL  20 mL Other Once Mohammed Kindle, MD      . triamcinolone acetonide (KENALOG-40) injection 40 mg  40 mg Other Once Mohammed Kindle, MD      . triamcinolone acetonide [facility]) injection 40 mg  40 mg Other Once Mohammed Kindle, MD        OBJECTIVE: Vitals:   04/08/16  1100  BP: (!) 165/78  Pulse: (!) 107  Temp: 97.6 F (36.4 C)     Body mass index is 33.51 kg/m.    ECOG FS:0 - Asymptomatic  General: Well-developed, well-nourished, no acute distress. Eyes: Pink conjunctiva, anicteric sclera. Breasts: Patient requested exam be deferred today. Lungs: Clear to auscultation bilaterally. Heart: Regular rate and rhythm. No rubs, murmurs, or gallops. Abdomen: Soft, nontender, nondistended. No organomegaly noted, normoactive bowel sounds. Musculoskeletal: No edema, cyanosis, or clubbing. Neuro: Alert, answering all questions appropriately. Cranial nerves grossly intact. Skin: No rashes or petechiae noted. Psych: Normal affect.    LAB RESULTS:  Lab Results  Component Value Date   NA 136 08/18/2015   K 3.6 08/18/2015   CL 99 (L) 08/18/2015   CO2 27 08/18/2015   GLUCOSE 122 (H) 08/18/2015   BUN 11 08/18/2015   CREATININE 1.00 08/18/2015   CALCIUM 9.2 08/18/2015   PROT 7.1 08/18/2015   ALBUMIN 3.7 08/18/2015   AST 27 08/18/2015   ALT 20 08/18/2015   ALKPHOS 49 08/18/2015   BILITOT 1.1 08/18/2015   GFRNONAA 52 (L) 08/18/2015   GFRAA >60 08/18/2015    Lab Results  Component Value Date   WBC 5.2 08/18/2015   NEUTROABS 2.8 11/26/2014   HGB 13.3 08/18/2015   HCT 39.0 08/18/2015   MCV 87.3 08/18/2015   PLT 162 08/18/2015     STUDIES: No results found.  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   PLAN:    1. Stage 1b ER/PR positive, HER-2 negative adenocarcinoma of  the upper outer quadrant of the right breast: Patient has now completed 5 years of aromatase inhibitor and has discontinued.  Her most recent on December 06, 2015 was reported as BI-RADS 3. Repeat right breast mammogram in October 2017. Return to clinic in 1 year for routine evaluation.  2. Bone mineral density: Bone mineral density from May 2015 was reported as normal with a T score of -1.0. Now the patient has discontinued her aromatase inhibitor , this can be monitored by her primary care physician.   Patient expressed understanding and was in agreement with this plan. She also understands that She can call clinic at any time with any questions, concerns, or complaints.    Lloyd Huger, MD   04/11/2016 8:54 AM

## 2016-04-08 ENCOUNTER — Inpatient Hospital Stay: Payer: Medicare Other | Attending: Oncology | Admitting: Oncology

## 2016-04-08 DIAGNOSIS — Z853 Personal history of malignant neoplasm of breast: Secondary | ICD-10-CM | POA: Insufficient documentation

## 2016-04-08 DIAGNOSIS — Z9223 Personal history of estrogen therapy: Secondary | ICD-10-CM | POA: Insufficient documentation

## 2016-04-08 DIAGNOSIS — I10 Essential (primary) hypertension: Secondary | ICD-10-CM | POA: Insufficient documentation

## 2016-04-08 DIAGNOSIS — Z803 Family history of malignant neoplasm of breast: Secondary | ICD-10-CM | POA: Diagnosis not present

## 2016-04-08 DIAGNOSIS — Z923 Personal history of irradiation: Secondary | ICD-10-CM | POA: Insufficient documentation

## 2016-04-08 DIAGNOSIS — Z7982 Long term (current) use of aspirin: Secondary | ICD-10-CM | POA: Insufficient documentation

## 2016-04-08 DIAGNOSIS — Z79899 Other long term (current) drug therapy: Secondary | ICD-10-CM | POA: Diagnosis not present

## 2016-04-08 DIAGNOSIS — M549 Dorsalgia, unspecified: Secondary | ICD-10-CM | POA: Diagnosis not present

## 2016-04-08 DIAGNOSIS — M2550 Pain in unspecified joint: Secondary | ICD-10-CM | POA: Diagnosis not present

## 2016-04-08 DIAGNOSIS — Z170 Estrogen receptor positive status [ER+]: Secondary | ICD-10-CM | POA: Diagnosis not present

## 2016-04-08 DIAGNOSIS — J449 Chronic obstructive pulmonary disease, unspecified: Secondary | ICD-10-CM | POA: Insufficient documentation

## 2016-04-08 DIAGNOSIS — C50411 Malignant neoplasm of upper-outer quadrant of right female breast: Secondary | ICD-10-CM

## 2016-04-08 NOTE — Progress Notes (Signed)
Patient here for follow up. Last breast exam was done by Dr. Tamala Julian. 11/2015. She states she has a knot in her left arm pit.

## 2016-04-14 ENCOUNTER — Encounter: Payer: Self-pay | Admitting: Pain Medicine

## 2016-04-14 ENCOUNTER — Ambulatory Visit: Payer: Medicare Other | Attending: Pain Medicine | Admitting: Pain Medicine

## 2016-04-14 VITALS — BP 119/75 | HR 62 | Temp 98.2°F | Resp 15 | Ht 61.0 in | Wt 180.0 lb

## 2016-04-14 DIAGNOSIS — M792 Neuralgia and neuritis, unspecified: Secondary | ICD-10-CM | POA: Insufficient documentation

## 2016-04-14 DIAGNOSIS — M4316 Spondylolisthesis, lumbar region: Secondary | ICD-10-CM | POA: Insufficient documentation

## 2016-04-14 DIAGNOSIS — M7550 Bursitis of unspecified shoulder: Secondary | ICD-10-CM | POA: Diagnosis not present

## 2016-04-14 DIAGNOSIS — B029 Zoster without complications: Secondary | ICD-10-CM | POA: Insufficient documentation

## 2016-04-14 DIAGNOSIS — Z9889 Other specified postprocedural states: Secondary | ICD-10-CM | POA: Insufficient documentation

## 2016-04-14 DIAGNOSIS — M1288 Other specific arthropathies, not elsewhere classified, other specified site: Secondary | ICD-10-CM | POA: Diagnosis not present

## 2016-04-14 DIAGNOSIS — M7060 Trochanteric bursitis, unspecified hip: Secondary | ICD-10-CM

## 2016-04-14 DIAGNOSIS — M5481 Occipital neuralgia: Secondary | ICD-10-CM

## 2016-04-14 DIAGNOSIS — C50411 Malignant neoplasm of upper-outer quadrant of right female breast: Secondary | ICD-10-CM

## 2016-04-14 DIAGNOSIS — M19019 Primary osteoarthritis, unspecified shoulder: Secondary | ICD-10-CM | POA: Diagnosis not present

## 2016-04-14 DIAGNOSIS — M533 Sacrococcygeal disorders, not elsewhere classified: Secondary | ICD-10-CM | POA: Diagnosis not present

## 2016-04-14 DIAGNOSIS — M5136 Other intervertebral disc degeneration, lumbar region: Secondary | ICD-10-CM | POA: Insufficient documentation

## 2016-04-14 DIAGNOSIS — M545 Low back pain: Secondary | ICD-10-CM | POA: Diagnosis present

## 2016-04-14 DIAGNOSIS — M5416 Radiculopathy, lumbar region: Secondary | ICD-10-CM

## 2016-04-14 DIAGNOSIS — Z981 Arthrodesis status: Secondary | ICD-10-CM | POA: Insufficient documentation

## 2016-04-14 DIAGNOSIS — M47817 Spondylosis without myelopathy or radiculopathy, lumbosacral region: Secondary | ICD-10-CM

## 2016-04-14 MED ORDER — HYDROCODONE-ACETAMINOPHEN 5-325 MG PO TABS: ORAL_TABLET | ORAL | 0 refills | Status: DC

## 2016-04-14 NOTE — Patient Instructions (Addendum)
PLAN   Continue present medication hydrocodone acetaminophen. Patient may take Mobic for 2-3 days without GI effects as explained to patient  F/U PCP Dr.Hande  for evaliation of  BP shingles  and general medical condition.   F/U surgical evaluation. May consider pending follow-up evaluations  F/U neurological evaluation. May consider PNCV/EMG studies and other studies pending follow-up evaluations  May consider radiofrequency rhizolysis or intraspinal procedures pending response to present treatment and F/U evaluation   Patient to call pain management prior to scheduled return appointment for any concern

## 2016-04-14 NOTE — Progress Notes (Signed)
Safety precautions to be maintained throughout the outpatient stay will include: orient to surroundings, keep bed in low position, maintain call bell within reach at all times, provide assistance with transfer out of bed and ambulation.  

## 2016-04-14 NOTE — Progress Notes (Signed)
      The patient is an 82 year old female who returns to pain management for further evaluation and treatment of pain involving the lumbar lower extremity region. The patient has had significant improvement of pain radiating to the left lower extremity following lumbosacral selective nerve root block. The patient states that she has pain across the lower back region in the region of the buttocks and is aggravated by sitting for periods of time. Patient denies any trauma change in events of daily living the call significant change in symptomatology. The patient states that the pain also can increase with excessive walking. We informed patient that we remain available to consider interventional treatment pending response to treatment and follow-up evaluation. A shunt will take Mobic for 2-3 days with caution to avoid aggravation of her GI condition. The patient is to follow-up with Dr. Ginette Pitman as well as call pain management should she have any abdominal discomfort including nausea with the use of MobicThe patient was with understanding and agreement suggested treatment plan      Physical examination   There was tenderness to palpation of paraspinal misreading cervical region cervical facet region a mild degree with mild tenderness of the splenius capitis and occipitalis region. There was mild tenderness of the acromioclavicular and glenohumeral joint region and patient was at unremarkable Spurling's maneuver and was able to perform drop test with moderate difficulty. Tinel and Phalen's maneuver were without increase of pain of significant degree. Palpation over the lumbar region was of increased pain of mild to moderate degree with lateral bending rotation extension and palpation of the lumbar facets reproducing mild to moderate discomfort. Straight leg raising was tolerated to 30 without increase of pain with dorsiflexion noted. No definite sensory deficit or dermatomal distribution detected. Palpation  of the PSIS and PII S regions reproduce moderate discomfort. There was moderate increase of pain with palpation of the greater trochanteric region iliotibial band region. Knees were attends to palpation of mild to moderate degree. EHL strength appeared to be slightly decreased. There was negative clonus negative Homans. Abdomen was nontender with no costovertebral angle tenderness noted.     Assessment     Shingles (left lumbosacral region)  Sacroiliac joint dysfunction  Degenerative joint disease of shoulder  Degenerative disc disease lumbar spine Suspected acute compression fracture at L3, superiorly on the LEFT. No retropulsion.  Moderate multifactorial spinal stenosis is suspected at L2-3 and L3-4. Previous lumbar surgery at L4-5 with adequate posterior decompression. Incomplete arthrodesis with 4 mm anterolisthesis. Probable solid arthrodesis at L5-S1 with BILATERAL foraminal  narrowing Moderate involvement L3-4 and L4-L5 with broad-based disc bulge and facet arthropathy present at L2-3, L3-4, L4-5, and L5-S1Lumbar facet syndrome   Gluteal and piriformis neuralgia  Bursitis of shoulder  Degenerative joint disease of shoulder      PLAN   Continue present medication hydrocodone acetaminophen. Patient may take Mobic for 2-3 days without GI effects as explained to patient  F/U PCP Dr.Hande  for evaliation of  BP shingles  and general medical condition.   F/U surgical evaluation. May consider pending follow-up evaluations  F/U neurological evaluation. May consider PNCV/EMG studies and other studies pending follow-up evaluations  May consider radiofrequency rhizolysis or intraspinal procedures pending response to present treatment and F/U evaluation   Patient to call pain management prior to scheduled return appointment for any concern

## 2016-05-10 ENCOUNTER — Other Ambulatory Visit: Payer: Self-pay | Admitting: Pain Medicine

## 2016-05-12 ENCOUNTER — Other Ambulatory Visit: Payer: Self-pay | Admitting: Pain Medicine

## 2016-06-01 ENCOUNTER — Emergency Department: Payer: Medicare Other

## 2016-06-01 ENCOUNTER — Emergency Department
Admission: EM | Admit: 2016-06-01 | Discharge: 2016-06-01 | Disposition: A | Discharge status: Home / Self Care | Payer: Medicare Other | Attending: Emergency Medicine | Admitting: Emergency Medicine

## 2016-06-01 ENCOUNTER — Encounter: Payer: Self-pay | Admitting: Emergency Medicine

## 2016-06-01 DIAGNOSIS — Z853 Personal history of malignant neoplasm of breast: Secondary | ICD-10-CM | POA: Diagnosis not present

## 2016-06-01 DIAGNOSIS — Z792 Long term (current) use of antibiotics: Secondary | ICD-10-CM | POA: Diagnosis not present

## 2016-06-01 DIAGNOSIS — Z79899 Other long term (current) drug therapy: Secondary | ICD-10-CM | POA: Insufficient documentation

## 2016-06-01 DIAGNOSIS — J42 Unspecified chronic bronchitis: Secondary | ICD-10-CM | POA: Diagnosis not present

## 2016-06-01 DIAGNOSIS — M545 Low back pain: Secondary | ICD-10-CM | POA: Insufficient documentation

## 2016-06-01 DIAGNOSIS — J45909 Unspecified asthma, uncomplicated: Secondary | ICD-10-CM | POA: Insufficient documentation

## 2016-06-01 DIAGNOSIS — R05 Cough: Secondary | ICD-10-CM | POA: Diagnosis present

## 2016-06-01 DIAGNOSIS — Z791 Long term (current) use of non-steroidal anti-inflammatories (NSAID): Secondary | ICD-10-CM | POA: Insufficient documentation

## 2016-06-01 DIAGNOSIS — Z7982 Long term (current) use of aspirin: Secondary | ICD-10-CM | POA: Insufficient documentation

## 2016-06-01 DIAGNOSIS — Z79891 Long term (current) use of opiate analgesic: Secondary | ICD-10-CM | POA: Insufficient documentation

## 2016-06-01 DIAGNOSIS — I10 Essential (primary) hypertension: Secondary | ICD-10-CM | POA: Diagnosis not present

## 2016-06-01 DIAGNOSIS — R059 Cough, unspecified: Secondary | ICD-10-CM

## 2016-06-01 DIAGNOSIS — G8929 Other chronic pain: Secondary | ICD-10-CM | POA: Diagnosis not present

## 2016-06-01 DIAGNOSIS — J209 Acute bronchitis, unspecified: Secondary | ICD-10-CM

## 2016-06-01 MED ORDER — DOXYCYCLINE HYCLATE 100 MG PO CAPS: 100.0000 mg | ORAL_CAPSULE | Freq: Two times a day (BID) | ORAL | 0 refills | Status: DC

## 2016-06-01 MED ORDER — PREDNISONE 10 MG PO TABS: ORAL_TABLET | ORAL | 0 refills | Status: DC

## 2016-06-01 NOTE — ED Provider Notes (Signed)
[facility] Emergency Department Provider Note  ____________________________________________   First MD Initiated Contact with Patient 06/01/16 1307     (approximate)  I have reviewed the triage vital signs and the nursing notes.   HISTORY  Chief Complaint Back Pain and Cough   HPI Janice Perkins is a 82 y.o. female is here today with complaint of cough for 3 weeks. Patient states that she has history of bronchitis and that for the most part her cough has been productive. She is unaware of any fever. She denies any fever, chills, nausea or vomiting. She states thatbecause of her frequent coughing is caused exacerbation of her chronic back pain. Patient currently is taking Norco 3 times a day for pain. She has been a patient of Dr. crisp in the past and has been getting facet injections. She states she was feeling better if she could get a injection of cortisone into her back. She denies any urinary symptoms or history of kidney stones. She states back pain is bilateral with the left side of her back hurting more than the right. She denies any paresthesias into her lower extremities, no incontinence of bowel or bladder, and no saddle paresthesias. She was recently evaluated by the neurosurgeon at [facility]. Family understands that she is not a candidate for back surgery because of multiple medical problems. Currently patient does have an appointment with Dr. Primus Bravo in [place] the end of this month. She was hoping to see one locally for continued injections. Currently she rates her pain as a 6/10. This is also very anxious that she probably has pneumonia and is requesting a chest x-ray.   Past Medical History:  Diagnosis Date  . Asthma   . Bronchitis 04/2015  . Cancer [facility]) 2011   breast  . COPD (chronic obstructive pulmonary disease) ([place])   . Hypertension   . Shingles    10/2015    Patient Active Problem List   Diagnosis Date Noted  . Primary cancer  of upper outer quadrant of right female breast ([place]) 04/07/2016  . Lumbar radiculopathy 03/23/2016  . Spinal stenosis, lumbar region, with neurogenic claudication 03/23/2016  . DDD (degenerative disc disease), lumbar 01/14/2015  . Lumbosacral facet joint syndrome 01/14/2015  . Sacroiliac joint dysfunction 01/14/2015  . Greater trochanteric bursitis 01/14/2015  . Bilateral occipital neuralgia 01/14/2015    Past Surgical History:  Procedure Laterality Date  . BREAST EXCISIONAL BIOPSY Right 11/29/13   two areas  . BREAST EXCISIONAL BIOPSY Right 10/30/2009   lumpectomy - radiation    Prior to Admission medications   Medication Sig Start Date End Date Taking? Authorizing Provider  ALPRAZolam (XANAX) 0.25 MG tablet Take 0.25 mg by mouth 2 (two) times daily.    Historical Provider, MD  ALPRAZolam Duanne Moron) 0.5 MG tablet Take 0.5 mg by mouth 2 (two) times daily as needed for anxiety.    Historical Provider, MD  aspirin 81 MG tablet Take 81 mg by mouth 2 (two) times daily.     Historical Provider, MD  atorvastatin (LIPITOR) 20 MG tablet Take 20 mg by mouth daily.    Historical Provider, MD  Cholecalciferol 1000 UNIT/10ML LIQD Take 1,000 Int'l Units/day by mouth every morning. Reported on 01/20/2016    Historical Provider, MD  doxycycline (VIBRAMYCIN) 100 MG capsule Take 1 capsule (100 mg total) by mouth 2 (two) times daily. 06/01/16   Johnn Hai, PA-C  esomeprazole (NEXIUM) 40 MG capsule Take 40 mg by mouth daily at 12  noon.    Historical Provider, MD  fexofenadine (ALLEGRA) 180 MG tablet Take 180 mg by mouth daily.    Historical Provider, MD  fluticasone (FLONASE) 50 MCG/ACT nasal spray Place into both nostrils daily.    Historical Provider, MD  Fluticasone-Salmeterol (ADVAIR) 100-50 MCG/DOSE AEPB Inhale 1 puff into the lungs 2 (two) times daily.    Historical Provider, MD  hydrALAZINE (APRESOLINE) 25 MG tablet Take 50 mg by mouth 3 (three) times daily.     Historical Provider, MD    HYDROcodone-acetaminophen (NORCO/VICODIN) 5-325 MG tablet Limit 1 tab by mouth 2-3 times per day if tolerated 04/14/16   Mohammed Kindle, MD  HYDROcodone-acetaminophen (NORCO/VICODIN) 5-325 MG tablet Limit 1 tab by mouth 2-3 times per day if tolerated 04/14/16   Mohammed Kindle, MD  ipratropium (ATROVENT) 0.02 % nebulizer solution Take 0.5 mg by nebulization 4 (four) times daily as needed.     Historical Provider, MD  levothyroxine (SYNTHROID, LEVOTHROID) 25 MCG tablet Take 25 mcg by mouth daily before breakfast.    Historical Provider, MD  meloxicam (MOBIC) 15 MG tablet Take 15 mg by mouth as needed for pain.    Historical Provider, MD  metoprolol (LOPRESSOR) 50 MG tablet Take 50 mg by mouth 2 (two) times daily.    Historical Provider, MD  predniSONE (DELTASONE) 10 MG tablet Take 3 tablets once a day for 2 days, take 2 tablets once a day for 2 days, and 1 tablet once a day for 2 days. 06/01/16   Johnn Hai, PA-C  tiZANidine (ZANAFLEX) 2 MG tablet Take by mouth 3 (three) times daily. prn for muscle spasm    Historical Provider, MD    Allergies Penicillins; Captopril; Enalapril maleate; Seldane  [terfenadine]; Tape; Augmentin [amoxicillin-pot clavulanate]; and Biaxin [clarithromycin]  Family History  Problem Relation Age of Onset  . Hypertension Mother   . Heart disease Mother   . Cancer Mother   . Stroke Father   . Hypertension Father   . Breast cancer Sister     Social History Social History  Substance Use Topics  . Smoking status: Never Smoker  . Smokeless tobacco: Never Used  . Alcohol use No    Review of Systems Constitutional: No fever/chills Cardiovascular: Denies chest pain. Respiratory: Denies shortness of breath. Gastrointestinal: No abdominal pain.  No nausea, no vomiting.  No diarrhea.  No constipation. Genitourinary: Negative for dysuria. Musculoskeletal: Positive for acute and chronic back pain. Skin: Negative for rash. Neurological: Negative for headaches, focal  weakness or numbness.  10-point ROS otherwise negative.  ____________________________________________   PHYSICAL EXAM:  VITAL SIGNS: ED Triage Vitals  Enc Vitals Group     BP 06/01/16 1216 (!) 156/73     Pulse Rate 06/01/16 1216 75     Resp 06/01/16 1216 20     Temp 06/01/16 1216 98 F (36.7 C)     Temp Source 06/01/16 1216 Oral     SpO2 06/01/16 1216 95 %     Weight 06/01/16 1217 180 lb (81.6 kg)     Height 06/01/16 1217 5\' 1"  (1.549 m)     Head Circumference --      Peak Flow --      Pain Score 06/01/16 1221 6     Pain Loc --      Pain Edu? --      Excl. in [place]? --     Constitutional: Alert and oriented. Well appearing and in no acute distress. Eyes: Conjunctivae are normal. PERRL. EOMI. Head:  Atraumatic. Nose: No congestion/rhinnorhea. Neck: No stridor.   Cardiovascular: Normal rate, regular rhythm. Grossly normal heart sounds.  Good peripheral circulation. Respiratory: Normal respiratory effort.  No retractions. Lungs CTAB. Gastrointestinal: Soft and nontender.  Musculoskeletal: On examination the back there is no gross deformity. There is generalized tenderness of the lumbar spine and paravertebral muscles with increased pain on palpation of bilateral SI joint areas. Patient is ambulatory without assistance. Muscle strength is equal bilaterally. Neurologic:  Normal speech and language. No gross focal neurologic deficits are appreciated. No gait instability. Reflexes 2+ bilaterally. Skin:  Skin is warm, dry and intact. No rash noted. Psychiatric: Mood and affect are normal. Speech and behavior are normal.  ____________________________________________   LABS (all labs ordered are listed, but only abnormal results are displayed)  Labs Reviewed - No data to display  RADIOLOGY  Chest x-ray per radiologist shows chronic bronchitic changes. I, Johnn Hai, personally viewed and evaluated these images (plain radiographs) as part of my medical decision making, as  well as reviewing the written report by the radiologist. ____________________________________________   PROCEDURES  Procedure(s) performed: None  Procedures  Critical Care performed: No  ____________________________________________   INITIAL IMPRESSION / ASSESSMENT AND PLAN / ED COURSE  Pertinent labs & imaging results that were available during my care of the patient were reviewed by me and considered in my medical decision making (see chart for details).    Clinical Course   Patient was made aware that we do not do facet injections in the emergency room. Initially she was against taking oral prednisone as "it does not help". With her cough and chronic bronchitis we discussed the positive aspect of taking the prednisone both for her cough and for her back pain at this time. She is to call her primary care doctor for information about referral to Dr. Gwendolyn Fill as that was mentioned in Dr. Jonathon Jordan notes. Patient was also started on doxycycline to help with her bronchitis as she believes she needs an antibiotic. Family members are present and also agree that she has been coughing for a week and that is necessary. She was also encouraged to take Delsym for her cough as this is nonnarcotic and would not cause drowsiness or increase her risk for falling. Patient will see her PCP also to decide whether she wants to see the local doctor here for her back or continue seeing Dr. crisp in [place].  ____________________________________________   FINAL CLINICAL IMPRESSION(S) / ED DIAGNOSES  Final diagnoses:  Cough  Chronic bilateral low back pain without sciatica  Chronic bronchitis with acute exacerbation ([place])      NEW MEDICATIONS STARTED DURING THIS VISIT:  Discharge Medication List as of 06/01/2016  2:47 PM    START taking these medications   Details  doxycycline (VIBRAMYCIN) 100 MG capsule Take 1 capsule (100 mg total) by mouth 2 (two) times daily., Starting Mon 06/01/2016,  Print    predniSONE (DELTASONE) 10 MG tablet Take 3 tablets once a day for 2 days, take 2 tablets once a day for 2 days, and 1 tablet once a day for 2 days., Print         Note:  This document was prepared using Dragon voice recognition software and may include unintentional dictation errors.    Johnn Hai, PA-C 06/01/16 1858    Earleen [place], MD 06/02/16 [phone]

## 2016-06-01 NOTE — Discharge Instructions (Signed)
Begin taking hydrocodone as directed for your back pain on a regular basis. This will also help with your cough. You may take Delsym with this medication if extra medication as needed to control your  cough. Doxycycline twice a day for 7 days. Prednisone taper beginning at 30 mg over a 6 day course. Follow-up with your primary care doctor at [facility] for any continued problems with bronchitis. You  may also call about the referral to the pain clinic at [facility] as mentioned by Dr. Lacinda Axon.

## 2016-06-01 NOTE — ED Triage Notes (Addendum)
Pt to ED for chronic back pain. Pt denies any dysuria or hematuria. Pt states she has been trying to get into her PCP for a cortisone shot and hasn't been able to. Pt also states she has been having a productive cough x2-3weeks. Pt denies any fevers. Pt A&O, VS stable, pt in NAD at this time.

## 2016-06-01 NOTE — ED Notes (Signed)
See triage note   States she has had cough for about 3 weeks   Also having chronic back pain    Ambulates well to treatment area

## 2016-06-10 ENCOUNTER — Ambulatory Visit: Payer: Medicare Other

## 2016-06-10 ENCOUNTER — Other Ambulatory Visit: Payer: Medicare Other

## 2016-06-16 ENCOUNTER — Other Ambulatory Visit: Payer: Self-pay | Admitting: Oncology

## 2016-06-16 ENCOUNTER — Ambulatory Visit
Admission: RE | Admit: 2016-06-16 | Discharge: 2016-06-16 | Disposition: A | Discharge status: Home / Self Care | Payer: Medicare Other | Source: Ambulatory Visit | Attending: Oncology | Admitting: Oncology

## 2016-06-16 DIAGNOSIS — C50411 Malignant neoplasm of upper-outer quadrant of right female breast: Secondary | ICD-10-CM | POA: Diagnosis present

## 2016-06-16 HISTORY — DX: Malignant neoplasm of unspecified site of unspecified female breast: C50.919

## 2016-08-18 ENCOUNTER — Emergency Department
Admission: EM | Admit: 2016-08-18 | Discharge: 2016-08-18 | Disposition: A | Discharge status: Home / Self Care | Payer: Medicare Other | Attending: Emergency Medicine | Admitting: Emergency Medicine

## 2016-08-18 ENCOUNTER — Encounter: Payer: Self-pay | Admitting: Medical Oncology

## 2016-08-18 DIAGNOSIS — Z853 Personal history of malignant neoplasm of breast: Secondary | ICD-10-CM | POA: Insufficient documentation

## 2016-08-18 DIAGNOSIS — Z7982 Long term (current) use of aspirin: Secondary | ICD-10-CM | POA: Insufficient documentation

## 2016-08-18 DIAGNOSIS — J45909 Unspecified asthma, uncomplicated: Secondary | ICD-10-CM | POA: Insufficient documentation

## 2016-08-18 DIAGNOSIS — Z79899 Other long term (current) drug therapy: Secondary | ICD-10-CM | POA: Diagnosis not present

## 2016-08-18 DIAGNOSIS — M545 Low back pain, unspecified: Secondary | ICD-10-CM

## 2016-08-18 DIAGNOSIS — G8929 Other chronic pain: Secondary | ICD-10-CM

## 2016-08-18 DIAGNOSIS — J449 Chronic obstructive pulmonary disease, unspecified: Secondary | ICD-10-CM | POA: Insufficient documentation

## 2016-08-18 DIAGNOSIS — I10 Essential (primary) hypertension: Secondary | ICD-10-CM | POA: Insufficient documentation

## 2016-08-18 MED ORDER — HYDROMORPHONE HCL 1 MG/ML IJ SOLN
1.0000 mg | Freq: Once | INTRAMUSCULAR | Status: AC
  Administered 2016-08-18: 1 mg via INTRAMUSCULAR
  Filled 2016-08-18: qty 1

## 2016-08-18 MED ORDER — BACITRACIN ZINC 500 UNIT/GM EX OINT
TOPICAL_OINTMENT | CUTANEOUS | Status: AC
  Filled 2016-08-18: qty 0.9

## 2016-08-18 MED ORDER — ORPHENADRINE CITRATE 30 MG/ML IJ SOLN
30.0000 mg | Freq: Once | INTRAMUSCULAR | Status: AC
  Administered 2016-08-18: 30 mg via INTRAMUSCULAR
  Filled 2016-08-18: qty 2

## 2016-08-18 NOTE — ED Provider Notes (Signed)
[facility] Emergency Department Provider Note   ____________________________________________   First MD Initiated Contact with Patient 08/18/16 1353     (approximate)  I have reviewed the triage vital signs and the nursing notes.   HISTORY  Chief Complaint patient rating the pain as a 10 over 10. Back Pain    HPI Janice Perkins is a 83 y.o. female patient percent for chronic back pain which is increased in the past 3 days. Patient states she went to a pain clinic on December 28 and was given pain medication is not helping. Patient states she contacted the pain clinic and they told to come in tomorrow. Patient states she cannot wait until tomorrow for pain relief. He denies any provocative incident to increase her pain. Patient states she is also having bilateral knee pain.  Past Medical History:  Diagnosis Date  . Asthma   . Breast cancer ([place]) 2011  . Bronchitis 04/2015  . Cancer [facility]) 2011   breast  . COPD (chronic obstructive pulmonary disease) ([place])   . Hypertension   . Shingles    10/2015    Patient Active Problem List   Diagnosis Date Noted  . Primary cancer of upper outer quadrant of right female breast ([place]) 04/07/2016  . Lumbar radiculopathy 03/23/2016  . Spinal stenosis, lumbar region, with neurogenic claudication 03/23/2016  . DDD (degenerative disc disease), lumbar 01/14/2015  . Lumbosacral facet joint syndrome 01/14/2015  . Sacroiliac joint dysfunction 01/14/2015  . Greater trochanteric bursitis 01/14/2015  . Bilateral occipital neuralgia 01/14/2015    Past Surgical History:  Procedure Laterality Date  . BREAST EXCISIONAL BIOPSY Right 11/29/13   two areas  . BREAST EXCISIONAL BIOPSY Right 10/30/2009   lumpectomy - radiation    Prior to Admission medications   Medication Sig Start Date End Date Taking? Authorizing Provider  ALPRAZolam (XANAX) 0.25 MG tablet Take 0.25 mg by mouth 2 (two) times daily.    Historical Provider, MD    ALPRAZolam Duanne Moron) 0.5 MG tablet Take 0.5 mg by mouth 2 (two) times daily as needed for anxiety.    Historical Provider, MD  aspirin 81 MG tablet Take 81 mg by mouth 2 (two) times daily.     Historical Provider, MD  atorvastatin (LIPITOR) 20 MG tablet Take 20 mg by mouth daily.    Historical Provider, MD  Cholecalciferol 1000 UNIT/10ML LIQD Take 1,000 Int'l Units/day by mouth every morning. Reported on 01/20/2016    Historical Provider, MD  doxycycline (VIBRAMYCIN) 100 MG capsule Take 1 capsule (100 mg total) by mouth 2 (two) times daily. 06/01/16   Johnn Hai, PA-C  esomeprazole (NEXIUM) 40 MG capsule Take 40 mg by mouth daily at 12 noon.    Historical Provider, MD  fexofenadine (ALLEGRA) 180 MG tablet Take 180 mg by mouth daily.    Historical Provider, MD  fluticasone (FLONASE) 50 MCG/ACT nasal spray Place into both nostrils daily.    Historical Provider, MD  Fluticasone-Salmeterol (ADVAIR) 100-50 MCG/DOSE AEPB Inhale 1 puff into the lungs 2 (two) times daily.    Historical Provider, MD  hydrALAZINE (APRESOLINE) 25 MG tablet Take 50 mg by mouth 3 (three) times daily.     Historical Provider, MD  HYDROcodone-acetaminophen (NORCO/VICODIN) 5-325 MG tablet Limit 1 tab by mouth 2-3 times per day if tolerated 04/14/16   Mohammed Kindle, MD  HYDROcodone-acetaminophen (NORCO/VICODIN) 5-325 MG tablet Limit 1 tab by mouth 2-3 times per day if tolerated 04/14/16   Mohammed Kindle, MD  ipratropium (  ATROVENT) 0.02 % nebulizer solution Take 0.5 mg by nebulization 4 (four) times daily as needed.     Historical Provider, MD  levothyroxine (SYNTHROID, LEVOTHROID) 25 MCG tablet Take 25 mcg by mouth daily before breakfast.    Historical Provider, MD  meloxicam (MOBIC) 15 MG tablet Take 15 mg by mouth as needed for pain.    Historical Provider, MD  metoprolol (LOPRESSOR) 50 MG tablet Take 50 mg by mouth 2 (two) times daily.    Historical Provider, MD  predniSONE (DELTASONE) 10 MG tablet Take 3 tablets once a day for 2  days, take 2 tablets once a day for 2 days, and 1 tablet once a day for 2 days. 06/01/16   Johnn Hai, PA-C  tiZANidine (ZANAFLEX) 2 MG tablet Take by mouth 3 (three) times daily. prn for muscle spasm    Historical Provider, MD    Allergies   Family History  Problem Relation Age of Onset  . Hypertension Mother   . Heart disease Mother   . Cancer Mother   . Stroke Father   . Hypertension Father   . Breast cancer Sister     Social History Social History  Substance Use Topics  . Smoking status: Never Smoker  . Smokeless tobacco: Never Used  . Alcohol use No    Review of Systems Constitutional: No fever/chills Eyes: No visual changes. ENT: No sore throat. Cardiovascular: Denies chest pain. Respiratory: Denies shortness of breath. Gastrointestinal: No abdominal pain.  No nausea, no vomiting.  No diarrhea.  No constipation. Genitourinary: Negative for dysuria. Musculoskeletal:Chronic back pain Skin: Negative for rash. Neurological: Negative for headaches, focal weakness or numbness.    ____________________________________________   PHYSICAL EXAM:  VITAL SIGNS: ED Triage Vitals [08/18/16 1232]  Enc Vitals Group     BP 124/86     Pulse Rate 63     Resp 17     Temp 98.4 F (36.9 C)     Temp Source Oral     SpO2 96 %     Weight 180 lb (81.6 kg)     Height      Head Circumference      Peak Flow      Pain Score 10     Pain Loc      Pain Edu?      Excl. in [place]?     Constitutional: Alert and oriented. Well appearing and in no acute distress. Eyes: Conjunctivae are normal. PERRL. EOMI. Head: Atraumatic. Nose: No congestion/rhinnorhea. Mouth/Throat: Mucous membranes are moist.  Oropharynx non-erythematous. Neck: No stridor.  No cervical spine tenderness to palpation. Hematological/Lymphatic/Immunilogical: No cervical lymphadenopathy. Cardiovascular: Normal rate, regular rhythm. Grossly normal heart sounds.  Good peripheral circulation. Respiratory: Normal  respiratory effort.  No retractions. Lungs CTAB. Gastrointestinal: Soft and nontender. No distention. No abdominal bruits. No CVA tenderness. Musculoskeletal: No obvious deformity. Patient is moderate guarding palpation L3-L5. Patient extremely leg test.  Neurologic:  Normal speech and language. No gross focal neurologic deficits are appreciated. No gait instability. Skin:  Skin is warm, dry and intact. No rash noted. Psychiatric: Mood and affect are normal. Speech and behavior are normal.  ____________________________________________   LABS (all labs ordered are listed, but only abnormal results are displayed)  Labs Reviewed - No data to display ____________________________________________  EKG   ____________________________________________  RADIOLOGY  Reviewed previous lumbar CT spine suggested of a compression fracture L3. ____________________________________________   PROCEDURES  Procedure(s) performed: None  Procedures  Critical Care performed: No  ____________________________________________  INITIAL IMPRESSION / ASSESSMENT AND PLAN / ED COURSE  Pertinent labs & imaging results that were available during my care of the patient were reviewed by me and considered in my medical decision making (see chart for details).  Acute back pain.  Clinical Course   Patient given Dilaudid and Norflex and advised follow-up with scheduled appointment tomorrow.   ____________________________________________   FINAL CLINICAL IMPRESSION(S) / ED DIAGNOSES  Final diagnoses:  Chronic midline low back pain without sciatica      NEW MEDICATIONS STARTED DURING THIS VISIT:  New Prescriptions   No medications on file     Note:  This document was prepared using Dragon voice recognition software and may include unintentional dictation errors.    Sable Feil, PA-C 08/18/16 [place], PA-C 08/18/16 1415    Earleen Newport, MD 08/18/16 [phone]

## 2016-08-18 NOTE — ED Triage Notes (Signed)
Pt reports she has chronic back pain and goes to pain clinic, went to clinic Wednesday and has continued to have pain. Pt reports she can not get back into visit until tomorrow. Pt reports she is also having bilt knee pain.

## 2016-08-18 NOTE — ED Notes (Signed)
Pt c/o right hand pain from injury a few days ago - states she is unable to open her medicine bottles and would like for the provider to "look" at it - provider advised

## 2016-08-18 NOTE — ED Notes (Signed)
See triage note  States she developed lower back couple of days ago   States pain started on left side and moved across lower back  Now radiates into both legs

## 2016-09-01 ENCOUNTER — Ambulatory Visit: Payer: Medicare Other | Admitting: Anesthesiology

## 2016-09-08 ENCOUNTER — Other Ambulatory Visit: Payer: Self-pay | Admitting: Pain Medicine

## 2016-11-02 ENCOUNTER — Encounter
Admission: RE | Admit: 2016-11-02 | Discharge: 2016-11-02 | Disposition: A | Discharge status: Home / Self Care | Payer: Medicare Other | Source: Ambulatory Visit | Attending: Internal Medicine | Admitting: Internal Medicine

## 2016-11-02 ENCOUNTER — Other Ambulatory Visit: Payer: Self-pay | Admitting: Surgery

## 2016-11-02 DIAGNOSIS — Z853 Personal history of malignant neoplasm of breast: Secondary | ICD-10-CM

## 2016-11-11 ENCOUNTER — Non-Acute Institutional Stay (SKILLED_NURSING_FACILITY): Payer: Medicare Other | Admitting: Gerontology

## 2016-11-11 DIAGNOSIS — M48062 Spinal stenosis, lumbar region with neurogenic claudication: Secondary | ICD-10-CM | POA: Diagnosis not present

## 2016-11-12 ENCOUNTER — Other Ambulatory Visit
Admission: RE | Admit: 2016-11-12 | Discharge: 2016-11-12 | Disposition: A | Discharge status: Home / Self Care | Payer: Medicare Other | Source: Ambulatory Visit | Attending: Internal Medicine | Admitting: Internal Medicine

## 2016-11-12 DIAGNOSIS — M48061 Spinal stenosis, lumbar region without neurogenic claudication: Secondary | ICD-10-CM | POA: Diagnosis present

## 2016-11-12 LAB — CBC WITH DIFFERENTIAL/PLATELET
Basophils Absolute: 0.1 10*3/uL (ref 0–0.1)
Basophils Relative: 1 %
EOS ABS: 0.5 10*3/uL (ref 0–0.7)
EOS PCT: 7 %
HCT: 32.3 % — ABNORMAL LOW (ref 35.0–47.0)
Hemoglobin: 11.1 g/dL — ABNORMAL LOW (ref 12.0–16.0)
Lymphocytes Relative: 13 %
Lymphs Abs: 1.1 10*3/uL (ref 1.0–3.6)
MCH: 29.6 pg (ref 26.0–34.0)
MCHC: 34.2 g/dL (ref 32.0–36.0)
MCV: 86.7 fL (ref 80.0–100.0)
Monocytes Absolute: 0.8 10*3/uL (ref 0.2–0.9)
Monocytes Relative: 11 %
Neutro Abs: 5.6 10*3/uL (ref 1.4–6.5)
Neutrophils Relative %: 68 %
PLATELETS: 273 10*3/uL (ref 150–440)
RBC: 3.73 MIL/uL — AB (ref 3.80–5.20)
RDW: 16.2 % — ABNORMAL HIGH (ref 11.5–14.5)
WBC: 8.1 10*3/uL (ref 3.6–11.0)

## 2016-11-12 LAB — COMPREHENSIVE METABOLIC PANEL
ALBUMIN: 3.3 g/dL — AB (ref 3.5–5.0)
ALK PHOS: 53 U/L (ref 38–126)
ALT: 18 U/L (ref 14–54)
ANION GAP: 7 (ref 5–15)
AST: 32 U/L (ref 15–41)
BILIRUBIN TOTAL: 1.2 mg/dL (ref 0.3–1.2)
BUN: 10 mg/dL (ref 6–20)
CALCIUM: 9 mg/dL (ref 8.9–10.3)
CO2: 32 mmol/L (ref 22–32)
Chloride: 94 mmol/L — ABNORMAL LOW (ref 101–111)
Creatinine, Ser: 0.75 mg/dL (ref 0.44–1.00)
GFR calc Af Amer: >60 mL/min (ref >60)
GLUCOSE: 101 mg/dL — AB (ref 65–99)
POTASSIUM: 4.5 mmol/L (ref 3.5–5.1)
Sodium: 133 mmol/L — ABNORMAL LOW (ref 135–145)
TOTAL PROTEIN: 6.7 g/dL (ref 6.5–8.1)

## 2016-11-12 LAB — MAGNESIUM: MAGNESIUM: 1.9 mg/dL (ref 1.7–2.4)

## 2016-11-15 ENCOUNTER — Encounter: Admission: RE | Admit: 2016-11-15 | Payer: Medicare Other | Source: Ambulatory Visit | Admitting: Internal Medicine

## 2016-11-17 NOTE — Progress Notes (Signed)
Location:      Place of Service:  SNF (31) Provider:  Toni Arthurs, NP-C  [name], MD  Patient Care Team: [name], MD as PCP - General (Internal Medicine)  Extended Emergency Contact Information Primary Emergency Contact: Deacon,James Address: [address]          [place], [place] [address] [place] Phone: [phone] Relation: Spouse Secondary Emergency Contact: Donna Bernard Address: [address]          [place], [place] [id number] [place] Phone: [phone] Relation: Daughter  Code Status:  full Goals of care: Advanced Directive information Advanced Directives 08/18/2016  Does Patient Have a Medical Advance Directive? No  Type of Advance Directive -  Does patient want to make changes to medical advance directive? -  Copy of [place] in Chart? -  Would patient like information on creating a medical advance directive? -     Chief Complaint  Patient presents with  . Follow-up    HPI:  Pt is a 83 y.o. female seen today for follow up visit s/p admission to the facility for rehab following surgical intervention for spinal stenosis. Pt has been participating in PT/OT. She is walking fairly well with the assistance of a walker. Pt is on O2 2L [place]. Pt reports she is having worsening nerve pain. On Gabapentin. Will increase gabapentin dose for now. Appetite is good, eating well. Voiding without difficulty, having regular BMs. Incision CDI. VSS. No other complaints.     Past Medical History:  Diagnosis Date  . Asthma   . Breast cancer ([facility]) 2011  . Bronchitis 04/2015  . Cancer [facility]) 2011   breast  . COPD (chronic obstructive pulmonary disease) ([facility])   . Hypertension   . Shingles    10/2015   Past Surgical History:  Procedure Laterality Date  . BREAST EXCISIONAL BIOPSY Right 11/29/13   two areas  . BREAST EXCISIONAL BIOPSY Right 10/30/2009   lumpectomy - radiation    Allergies  Allergen  Reactions  . Penicillins Hives  . Captopril Other (See Comments)  . Enalapril Maleate     Other reaction(s): Unknown  . Seldane  [Terfenadine] Other (See Comments)  . Tape Itching  . Augmentin [Amoxicillin-Pot Clavulanate] Rash  . Biaxin [Clarithromycin] Rash, Other (See Comments) and Hives    Other reaction(s): Unknown    Allergies as of 11/11/2016      Reactions   Penicillins Hives   Captopril Other (See Comments)   Enalapril Maleate    Other reaction(s): Unknown   Seldane  [terfenadine] Other (See Comments)   Tape Itching   Augmentin [amoxicillin-pot Clavulanate] Rash   Biaxin [clarithromycin] Rash, Other (See Comments), Hives   Other reaction(s): Unknown      Medication List       Accurate as of 11/11/16 11:59 PM. Always use your most recent med list.          ALPRAZolam 0.25 MG tablet Commonly known as:  XANAX Take 0.25 mg by mouth 2 (two) times daily.   XANAX 0.5 MG tablet Generic drug:  ALPRAZolam Take 0.5 mg by mouth 2 (two) times daily as needed for anxiety.   aspirin 81 MG tablet Take 81 mg by mouth 2 (two) times daily.   atorvastatin 20 MG tablet Commonly known as:  LIPITOR Take 20 mg by mouth daily.   Cholecalciferol 1000 UNIT/10ML Liqd Take 1,000 Int'l Units/day by mouth every morning. Reported on 01/20/2016   doxycycline 100  MG capsule Commonly known as:  VIBRAMYCIN Take 1 capsule (100 mg total) by mouth 2 (two) times daily.   esomeprazole 40 MG capsule Commonly known as:  NEXIUM Take 40 mg by mouth daily at 12 noon.   fexofenadine 180 MG tablet Commonly known as:  ALLEGRA Take 180 mg by mouth daily.   fluticasone 50 MCG/ACT nasal spray Commonly known as:  FLONASE Place into both nostrils daily.   Fluticasone-Salmeterol 100-50 MCG/DOSE Aepb Commonly known as:  ADVAIR Inhale 1 puff into the lungs 2 (two) times daily.   hydrALAZINE 25 MG tablet Commonly known as:  APRESOLINE Take 50 mg by mouth 3 (three) times daily.     HYDROcodone-acetaminophen 5-325 MG tablet Commonly known as:  NORCO/VICODIN Limit 1 tab by mouth 2-3 times per day if tolerated   HYDROcodone-acetaminophen 5-325 MG tablet Commonly known as:  NORCO/VICODIN Limit 1 tab by mouth 2-3 times per day if tolerated   ipratropium 0.02 % nebulizer solution Commonly known as:  ATROVENT Take 0.5 mg by nebulization 4 (four) times daily as needed.   levothyroxine 25 MCG tablet Commonly known as:  SYNTHROID, LEVOTHROID Take 25 mcg by mouth daily before breakfast.   meloxicam 15 MG tablet Commonly known as:  MOBIC Take 15 mg by mouth as needed for pain.   metoprolol 50 MG tablet Commonly known as:  LOPRESSOR Take 50 mg by mouth 2 (two) times daily.   predniSONE 10 MG tablet Commonly known as:  DELTASONE Take 3 tablets once a day for 2 days, take 2 tablets once a day for 2 days, and 1 tablet once a day for 2 days.   tiZANidine 2 MG tablet Commonly known as:  ZANAFLEX Take by mouth 3 (three) times daily. prn for muscle spasm       Review of Systems  Constitutional: Negative for activity change, appetite change, chills, diaphoresis and fever.  HENT: Negative for congestion, sneezing, sore throat, trouble swallowing and voice change.   Respiratory: Negative for apnea, cough, choking, chest tightness, shortness of breath and wheezing.   Cardiovascular: Negative for chest pain, palpitations and leg swelling.  Gastrointestinal: Negative for abdominal distention, abdominal pain, constipation, diarrhea and nausea.  Genitourinary: Negative for difficulty urinating, dysuria, frequency and urgency.  Musculoskeletal: Positive for arthralgias (typical arthritis), back pain, gait problem and myalgias.  Skin: Positive for wound. Negative for color change, pallor and rash.  Neurological: Negative for dizziness, tremors, syncope, speech difficulty, weakness, numbness and headaches.  Psychiatric/Behavioral: Negative for agitation and behavioral problems.   All other systems reviewed and are negative.    There is no immunization history on file for this patient. Pertinent  Health Maintenance Due  Topic Date Due  . PNA vac Low Risk Adult (1 of 2 - PCV13) 09/25/1999  . INFLUENZA VACCINE  03/17/2017  . DEXA SCAN  Completed   Fall Risk  04/14/2016 03/23/2016 02/12/2016 02/03/2016 01/20/2016  Falls in the past year? _0   Number falls in past yr: - - - - 1  Injury with Fall? - - - - No  Risk Factor Category  - - - - -  Risk for fall due to : - - - - History of fall(s)  Follow up - - - - Falls prevention discussed   Functional Status Survey:    Vitals:   11/11/16 0515  BP: (!) 149/77  Pulse: 75  Resp: 20  Temp: 97.9 F (36.6 C)  SpO2: 97%   There is no height  or weight on file to calculate BMI. Physical Exam  Constitutional: She is oriented to person, place, and time. Vital signs are normal. She appears well-developed and well-nourished. She is active and cooperative. She does not appear ill. No distress.  HENT:  Head: Normocephalic and atraumatic.  Mouth/Throat: Uvula is midline, oropharynx is clear and moist and mucous membranes are normal. Mucous membranes are not pale, not dry and not cyanotic.  Eyes: Conjunctivae, EOM and lids are normal. Pupils are equal, round, and reactive to light.  Neck: Trachea normal, normal range of motion and full passive range of motion without pain. Neck supple. No JVD present. No tracheal deviation, no edema and no erythema present. No thyromegaly present.  Cardiovascular: Normal rate, regular rhythm, normal heart sounds, intact distal pulses and normal pulses.  Exam reveals no gallop, no distant heart sounds and no friction rub.   No murmur heard. Pulmonary/Chest: Effort normal and breath sounds normal. No accessory muscle usage. No respiratory distress. She has no wheezes. She has no rales. She exhibits no tenderness.  Abdominal: Normal appearance and bowel sounds are normal. She exhibits no  distension and no ascites. There is no tenderness.  Musculoskeletal: She exhibits no edema or tenderness.       Lumbar back: She exhibits decreased range of motion, laceration (surgical incision) and pain.       Back:  Expected osteoarthritis, stiffness; calves soft, supple. Negative Homan's sign.  Neurological: She is alert and oriented to person, place, and time. She has normal strength.  Skin: Skin is warm and dry. Laceration (lumbar incision) noted. No rash noted. She is not diaphoretic. No cyanosis or erythema. No pallor. Nails show no clubbing.  Psychiatric: She has a normal mood and affect. Her speech is normal and behavior is normal. Judgment and thought content normal. Cognition and memory are normal.  Nursing note and vitals reviewed.   Labs reviewed:  Recent Labs  11/12/16 0645  NA 133*  K 4.5  CL 94*  CO2 32  GLUCOSE 101*  BUN 10  CREATININE 0.75  CALCIUM 9.0  MG 1.9    Recent Labs  11/12/16 0645  AST 32  ALT 18  ALKPHOS 53  BILITOT 1.2  PROT 6.7  ALBUMIN 3.3*    Recent Labs  11/12/16 0645  WBC 8.1  NEUTROABS 5.6  HGB 11.1*  HCT 32.3*  MCV 86.7  PLT 273   No results found for: TSH No results found for: HGBA1C No results found for: CHOL, HDL, LDLCALC, LDLDIRECT, TRIG, CHOLHDL  Significant Diagnostic Results in last 30 days:  No results found.  Assessment/Plan 1. Spinal stenosis, lumbar region, with neurogenic claudication  Obtain labs d/t muscle spasms, cramps  Continue working with PT/OT  Continue oxycodone 1-2 tablets PO Q 3 hours prn pain  Increase Gabapentin to 300 mg po TID  Naproxyn 250 mg po Q 12 hours for pain  Ice to area prn   Family/ staff Communication:   Total Time:  Documentation:  Face to Face:  Family/Phone:   Labs/tests ordered:  Cbc, met c  Medication list reviewed and assessed for continued appropriateness. Monthly medication orders reviewed and signed.  Vikki Ports, NP-C Geriatrics [facility] [phone], [place] [address] Cell Phone (Mon-Fri 8am-5pm):  [phone] On Call:  [phone] & follow prompts after 5pm & weekends Office Phone:  [phone] Office Fax:  [phone]

## 2016-11-19 ENCOUNTER — Non-Acute Institutional Stay (SKILLED_NURSING_FACILITY): Payer: Medicare Other | Admitting: Gerontology

## 2016-11-19 DIAGNOSIS — T814XXD Infection following a procedure, subsequent encounter: Secondary | ICD-10-CM

## 2016-11-19 DIAGNOSIS — M48062 Spinal stenosis, lumbar region with neurogenic claudication: Secondary | ICD-10-CM

## 2016-11-19 DIAGNOSIS — IMO0001 Reserved for inherently not codable concepts without codable children: Secondary | ICD-10-CM

## 2016-11-19 NOTE — Progress Notes (Signed)
Location:      Place of Service:  SNF (31) Provider:  Toni Arthurs, NP-C  [name], MD  Patient Care Team: [name], MD as PCP - General (Internal Medicine)  Extended Emergency Contact Information Primary Emergency Contact: Whittington,James Address: [address]          [place], [place] [address] [place] Phone: [phone] Relation: Spouse Secondary Emergency Contact: Donna Bernard Address: [address]          [place], [place] [id number] [place] Phone: [phone] Relation: Daughter  Code Status:  full Goals of care: Advanced Directive information Advanced Directives 08/18/2016  Does Patient Have a Medical Advance Directive? No  Type of Advance Directive -  Does patient want to make changes to medical advance directive? -  Copy of [place] in Chart? -  Would patient like information on creating a medical advance directive? -     Chief Complaint  Patient presents with  . Follow-up    HPI:  Pt is a 83 y.o. female seen today for follow up visit s/p admission to the facility for rehab following surgical intervention for spinal stenosis. Pt has been participating in PT/OT. She is walking fairly well with the assistance of a walker. Pt is on O2 2L [place]. Pt reports she is having worsening nerve pain. On Gabapentin. Will increase gabapentin dose for now. Appetite is good, eating well. Voiding without difficulty, having regular BMs. Incision CDI. VSS. No other complaints.     Past Medical History:  Diagnosis Date  . Asthma   . Breast cancer ([place]) 2011  . Bronchitis 04/2015  . Cancer [facility]) 2011   breast  . COPD (chronic obstructive pulmonary disease) ([place])   . Hypertension   . Shingles    10/2015   Past Surgical History:  Procedure Laterality Date  . BREAST EXCISIONAL BIOPSY Right 11/29/13   two areas  . BREAST EXCISIONAL BIOPSY Right 10/30/2009   lumpectomy - radiation    Allergies  Allergen  Reactions  . Penicillins Hives  . Captopril Other (See Comments)  . Enalapril Maleate     Other reaction(s): Unknown  . Seldane  [Terfenadine] Other (See Comments)  . Tape Itching  . Augmentin [Amoxicillin-Pot Clavulanate] Rash  . Biaxin [Clarithromycin] Rash, Other (See Comments) and Hives    Other reaction(s): Unknown    Allergies as of 11/19/2016      Reactions   Penicillins Hives   Captopril Other (See Comments)   Enalapril Maleate    Other reaction(s): Unknown   Seldane  [terfenadine] Other (See Comments)   Tape Itching   Augmentin [amoxicillin-pot Clavulanate] Rash   Biaxin [clarithromycin] Rash, Other (See Comments), Hives   Other reaction(s): Unknown      Medication List       Accurate as of 11/19/16  3:29 PM. Always use your most recent med list.          ALPRAZolam 0.25 MG tablet Commonly known as:  XANAX Take 0.25 mg by mouth 2 (two) times daily.   XANAX 0.5 MG tablet Generic drug:  ALPRAZolam Take 0.5 mg by mouth 2 (two) times daily as needed for anxiety.   aspirin 81 MG tablet Take 81 mg by mouth 2 (two) times daily.   atorvastatin 20 MG tablet Commonly known as:  LIPITOR Take 20 mg by mouth daily.   Cholecalciferol 1000 UNIT/10ML Liqd Take 1,000 Int'l Units/day by mouth every morning. Reported on 01/20/2016   doxycycline  100 MG capsule Commonly known as:  VIBRAMYCIN Take 1 capsule (100 mg total) by mouth 2 (two) times daily.   esomeprazole 40 MG capsule Commonly known as:  NEXIUM Take 40 mg by mouth daily at 12 noon.   fexofenadine 180 MG tablet Commonly known as:  ALLEGRA Take 180 mg by mouth daily.   fluticasone 50 MCG/ACT nasal spray Commonly known as:  FLONASE Place into both nostrils daily.   Fluticasone-Salmeterol 100-50 MCG/DOSE Aepb Commonly known as:  ADVAIR Inhale 1 puff into the lungs 2 (two) times daily.   hydrALAZINE 25 MG tablet Commonly known as:  APRESOLINE Take 50 mg by mouth 3 (three) times daily.     HYDROcodone-acetaminophen 5-325 MG tablet Commonly known as:  NORCO/VICODIN Limit 1 tab by mouth 2-3 times per day if tolerated   HYDROcodone-acetaminophen 5-325 MG tablet Commonly known as:  NORCO/VICODIN Limit 1 tab by mouth 2-3 times per day if tolerated   ipratropium 0.02 % nebulizer solution Commonly known as:  ATROVENT Take 0.5 mg by nebulization 4 (four) times daily as needed.   levothyroxine 25 MCG tablet Commonly known as:  SYNTHROID, LEVOTHROID Take 25 mcg by mouth daily before breakfast.   meloxicam 15 MG tablet Commonly known as:  MOBIC Take 15 mg by mouth as needed for pain.   metoprolol 50 MG tablet Commonly known as:  LOPRESSOR Take 50 mg by mouth 2 (two) times daily.   predniSONE 10 MG tablet Commonly known as:  DELTASONE Take 3 tablets once a day for 2 days, take 2 tablets once a day for 2 days, and 1 tablet once a day for 2 days.   tiZANidine 2 MG tablet Commonly known as:  ZANAFLEX Take by mouth 3 (three) times daily. prn for muscle spasm       Review of Systems  Constitutional: Negative for activity change, appetite change, chills, diaphoresis and fever.  HENT: Negative for congestion, sneezing, sore throat, trouble swallowing and voice change.   Respiratory: Negative for apnea, cough, choking, chest tightness, shortness of breath and wheezing.   Cardiovascular: Negative for chest pain, palpitations and leg swelling.  Gastrointestinal: Negative for abdominal distention, abdominal pain, constipation, diarrhea and nausea.  Genitourinary: Negative for difficulty urinating, dysuria, frequency and urgency.  Musculoskeletal: Positive for arthralgias (typical arthritis), back pain, gait problem and myalgias.  Skin: Positive for wound. Negative for color change, pallor and rash.  Neurological: Negative for dizziness, tremors, syncope, speech difficulty, weakness, numbness and headaches.  Psychiatric/Behavioral: Negative for agitation and behavioral problems.   All other systems reviewed and are negative.    There is no immunization history on file for this patient. Pertinent  Health Maintenance Due  Topic Date Due  . PNA vac Low Risk Adult (1 of 2 - PCV13) 09/25/1999  . INFLUENZA VACCINE  03/17/2017  . DEXA SCAN  Completed   Fall Risk  04/14/2016 03/23/2016 02/12/2016 02/03/2016 01/20/2016  Falls in the past year? _0   Number falls in past yr: - - - - 1  Injury with Fall? - - - - No  Risk Factor Category  - - - - -  Risk for fall due to : - - - - History of fall(s)  Follow up - - - - Falls prevention discussed   Functional Status Survey:    Vitals:   11/19/16 0500  BP: (!) 132/57  Pulse: 72  Resp: 20  Temp: 97.4 F (36.3 C)  SpO2: 91%   There is no  height or weight on file to calculate BMI. Physical Exam  Constitutional: She is oriented to person, place, and time. Vital signs are normal. She appears well-developed and well-nourished. She is active and cooperative. She does not appear ill. No distress.  HENT:  Head: Normocephalic and atraumatic.  Mouth/Throat: Uvula is midline, oropharynx is clear and moist and mucous membranes are normal. Mucous membranes are not pale, not dry and not cyanotic.  Eyes: Conjunctivae, EOM and lids are normal. Pupils are equal, round, and reactive to light.  Neck: Trachea normal, normal range of motion and full passive range of motion without pain. Neck supple. No JVD present. No tracheal deviation, no edema and no erythema present. No thyromegaly present.  Cardiovascular: Normal rate, regular rhythm, normal heart sounds, intact distal pulses and normal pulses.  Exam reveals no gallop, no distant heart sounds and no friction rub.   No murmur heard. Pulmonary/Chest: Effort normal and breath sounds normal. No accessory muscle usage. No respiratory distress. She has no wheezes. She has no rales. She exhibits no tenderness.  Abdominal: Normal appearance and bowel sounds are normal. She exhibits no  distension and no ascites. There is no tenderness.  Musculoskeletal: She exhibits no edema or tenderness.       Lumbar back: She exhibits decreased range of motion, laceration (surgical incision) and pain.       Back:  Expected osteoarthritis, stiffness; calves soft, supple. Negative Homan's sign.  Neurological: She is alert and oriented to person, place, and time. She has normal strength.  Skin: Skin is warm and dry. Laceration (lumbar incision) noted. No rash noted. She is not diaphoretic. No cyanosis or erythema. No pallor. Nails show no clubbing.  Psychiatric: She has a normal mood and affect. Her speech is normal and behavior is normal. Judgment and thought content normal. Cognition and memory are normal.  Nursing note and vitals reviewed.   Labs reviewed:  Recent Labs  11/12/16 0645  NA 133*  K 4.5  CL 94*  CO2 32  GLUCOSE 101*  BUN 10  CREATININE 0.75  CALCIUM 9.0  MG 1.9    Recent Labs  11/12/16 0645  AST 32  ALT 18  ALKPHOS 53  BILITOT 1.2  PROT 6.7  ALBUMIN 3.3*    Recent Labs  11/12/16 0645  WBC 8.1  NEUTROABS 5.6  HGB 11.1*  HCT 32.3*  MCV 86.7  PLT 273   No results found for: TSH No results found for: HGBA1C No results found for: CHOL, HDL, LDLCALC, LDLDIRECT, TRIG, CHOLHDL  Significant Diagnostic Results in last 30 days:  No results found.  Assessment/Plan 1. Spinal stenosis, lumbar region, with neurogenic claudication  Obtain labs d/t muscle spasms, cramps  Continue working with PT/OT  Continue oxycodone 1-2 tablets PO Q 3 hours prn pain  Increase Gabapentin to 300 mg po TID  Naproxyn 250 mg po Q 12 hours for pain  Ice to area prn   Family/ staff Communication:   Total Time:  Documentation:  Face to Face:  Family/Phone:   Labs/tests ordered:  Cbc, met c  Medication list reviewed and assessed for continued appropriateness. Monthly medication orders reviewed and signed.  Vikki Ports, NP-C Geriatrics [facility] [phone], [place] [address] Cell Phone (Mon-Fri 8am-5pm):  [phone] On Call:  [phone] & follow prompts after 5pm & weekends Office Phone:  [phone] Office Fax:  [phone]   Location:      Place of Service:  SNF (31) Provider:  Toni Arthurs, NP-C  [name], MD  Patient Care Team: [name], MD as PCP - General (Internal Medicine)  Extended Emergency Contact Information Primary Emergency Contact: Speltz,James Address: [address]          [place], [place] [address] [place] Phone: [phone] Relation: Spouse Secondary Emergency Contact: Donna Bernard Address: [address]          [place], [place] [id number] [place] Phone: [phone] Relation: Daughter  Code Status:  full Goals of care: Advanced Directive information Advanced Directives 08/18/2016  Does Patient Have a Medical Advance Directive? No  Type of Advance Directive -  Does patient want to make changes to medical advance directive? -  Copy of [place] in Chart? -  Would patient like information on creating a medical advance directive? -     Chief Complaint  Patient presents with  . Follow-up    HPI:  Pt is a 83 y.o. female seen today for follow up visit s/p admission to the facility for rehab following surgical intervention for spinal stenosis (Lumbar Laminectomy). Pt has been participating in PT/OT. She is walking fairly well with the assistance of a walker. Pt is on O2 2L [place]. Pt reports she is having worsening nerve pain. On Gabapentin. Attempted to increase gabapentin dose last week. Pt began having hallucinations and then refused the gabapentin. Started Naproxen, some improvement. Appetite is good, eating well. Voiding without difficulty, having regular BMs. Incision will moderate amount of serous drainage from small opening. Dark red tissue surrounding the wound. Pt does  not report any extra tenderness in this area, but overall reports pain as 8/10, dull, constant. VSS. No other complaints.     Past Medical History:  Diagnosis Date  . Asthma   . Breast cancer ([place]) 2011  . Bronchitis 04/2015  . Cancer [facility]) 2011   breast  . COPD (chronic obstructive pulmonary disease) ([place])   . Hypertension   . Shingles    10/2015   Past Surgical History:  Procedure Laterality Date  . BREAST EXCISIONAL BIOPSY Right 11/29/13   two areas  . BREAST EXCISIONAL BIOPSY Right 10/30/2009   lumpectomy - radiation    Allergies  Allergen Reactions  . Penicillins Hives  . Captopril Other (See Comments)  . Enalapril Maleate     Other reaction(s): Unknown  . Seldane  [Terfenadine] Other (See Comments)  . Tape Itching  . Augmentin [Amoxicillin-Pot Clavulanate] Rash  . Biaxin [Clarithromycin] Rash, Other (See Comments) and Hives    Other reaction(s): Unknown    Allergies as of 11/19/2016      Reactions   Penicillins Hives   Captopril Other (See Comments)   Enalapril Maleate    Other reaction(s): Unknown   Seldane  [terfenadine] Other (See Comments)   Tape Itching   Augmentin [amoxicillin-pot Clavulanate] Rash   Biaxin [clarithromycin] Rash, Other (See Comments), Hives   Other reaction(s): Unknown      Medication List       Accurate as of 11/19/16  3:29 PM. Always use your most recent med list.          ALPRAZolam 0.25 MG tablet Commonly known as:  XANAX Take 0.25 mg by mouth 2 (two) times daily.   XANAX 0.5 MG tablet Generic drug:  ALPRAZolam Take 0.5 mg by mouth 2 (two) times daily as needed for anxiety.   aspirin 81 MG tablet Take 81 mg by mouth 2 (two) times  daily.   atorvastatin 20 MG tablet Commonly known as:  LIPITOR Take 20 mg by mouth daily.   Cholecalciferol 1000 UNIT/10ML Liqd Take 1,000 Int'l Units/day by mouth every morning. Reported on 01/20/2016   doxycycline 100 MG capsule Commonly known as:  VIBRAMYCIN Take 1 capsule (100 mg total)  by mouth 2 (two) times daily.   esomeprazole 40 MG capsule Commonly known as:  NEXIUM Take 40 mg by mouth daily at 12 noon.   fexofenadine 180 MG tablet Commonly known as:  ALLEGRA Take 180 mg by mouth daily.   fluticasone 50 MCG/ACT nasal spray Commonly known as:  FLONASE Place into both nostrils daily.   Fluticasone-Salmeterol 100-50 MCG/DOSE Aepb Commonly known as:  ADVAIR Inhale 1 puff into the lungs 2 (two) times daily.   hydrALAZINE 25 MG tablet Commonly known as:  APRESOLINE Take 50 mg by mouth 3 (three) times daily.   HYDROcodone-acetaminophen 5-325 MG tablet Commonly known as:  NORCO/VICODIN Limit 1 tab by mouth 2-3 times per day if tolerated   HYDROcodone-acetaminophen 5-325 MG tablet Commonly known as:  NORCO/VICODIN Limit 1 tab by mouth 2-3 times per day if tolerated   ipratropium 0.02 % nebulizer solution Commonly known as:  ATROVENT Take 0.5 mg by nebulization 4 (four) times daily as needed.   levothyroxine 25 MCG tablet Commonly known as:  SYNTHROID, LEVOTHROID Take 25 mcg by mouth daily before breakfast.   meloxicam 15 MG tablet Commonly known as:  MOBIC Take 15 mg by mouth as needed for pain.   metoprolol 50 MG tablet Commonly known as:  LOPRESSOR Take 50 mg by mouth 2 (two) times daily.   predniSONE 10 MG tablet Commonly known as:  DELTASONE Take 3 tablets once a day for 2 days, take 2 tablets once a day for 2 days, and 1 tablet once a day for 2 days.   tiZANidine 2 MG tablet Commonly known as:  ZANAFLEX Take by mouth 3 (three) times daily. prn for muscle spasm       Review of Systems  Constitutional: Negative for activity change, appetite change, chills, diaphoresis and fever.  HENT: Negative for congestion, sneezing, sore throat, trouble swallowing and voice change.   Respiratory: Negative for apnea, cough, choking, chest tightness, shortness of breath and wheezing.   Cardiovascular: Negative for chest pain, palpitations and leg  swelling.  Gastrointestinal: Negative for abdominal distention, abdominal pain, constipation, diarrhea and nausea.  Genitourinary: Negative for difficulty urinating, dysuria, frequency and urgency.  Musculoskeletal: Positive for arthralgias (typical arthritis), back pain, gait problem and myalgias.  Skin: Positive for wound. Negative for color change, pallor and rash.  Neurological: Negative for dizziness, tremors, syncope, speech difficulty, weakness, numbness and headaches.  Psychiatric/Behavioral: Negative for agitation and behavioral problems.  All other systems reviewed and are negative.    There is no immunization history on file for this patient. Pertinent  Health Maintenance Due  Topic Date Due  . PNA vac Low Risk Adult (1 of 2 - PCV13) 09/25/1999  . INFLUENZA VACCINE  03/17/2017  . DEXA SCAN  Completed   Fall Risk  04/14/2016 03/23/2016 02/12/2016 02/03/2016 01/20/2016  Falls in the past year? _0   Number falls in past yr: - - - - 1  Injury with Fall? - - - - No  Risk Factor Category  - - - - -  Risk for fall due to : - - - - History of fall(s)  Follow up - - - - Falls prevention discussed  Functional Status Survey:    Vitals:   11/19/16 0500  BP: (!) 132/57  Pulse: 72  Resp: 20  Temp: 97.4 F (36.3 C)  SpO2: 91%   There is no height or weight on file to calculate BMI. Physical Exam  Constitutional: She is oriented to person, place, and time. Vital signs are normal. She appears well-developed and well-nourished. She is active and cooperative. She does not appear ill. No distress.  HENT:  Head: Normocephalic and atraumatic.  Mouth/Throat: Uvula is midline, oropharynx is clear and moist and mucous membranes are normal. Mucous membranes are not pale, not dry and not cyanotic.  Eyes: Conjunctivae, EOM and lids are normal. Pupils are equal, round, and reactive to light.  Neck: Trachea normal, normal range of motion and full passive range of motion without pain.  Neck supple. No JVD present. No tracheal deviation, no edema and no erythema present. No thyromegaly present.  Cardiovascular: Normal rate, regular rhythm, normal heart sounds, intact distal pulses and normal pulses.  Exam reveals no gallop, no distant heart sounds and no friction rub.   No murmur heard. Pulmonary/Chest: Effort normal and breath sounds normal. No accessory muscle usage. No respiratory distress. She has no wheezes. She has no rales. She exhibits no tenderness.  Abdominal: Normal appearance and bowel sounds are normal. She exhibits no distension and no ascites. There is no tenderness.  Musculoskeletal: She exhibits no edema or tenderness.       Lumbar back: She exhibits decreased range of motion, laceration (surgical incision) and pain.       Back:  Expected osteoarthritis, stiffness; calves soft, supple. Negative Homan's sign.  Neurological: She is alert and oriented to person, place, and time. She has normal strength.  Skin: Skin is warm and dry. Laceration (lumbar incision) noted. No rash noted. She is not diaphoretic. No cyanosis or erythema. No pallor. Nails show no clubbing.  Psychiatric: She has a normal mood and affect. Her speech is normal and behavior is normal. Judgment and thought content normal. Cognition and memory are normal.  Nursing note and vitals reviewed.   Labs reviewed:  Recent Labs  11/12/16 0645  NA 133*  K 4.5  CL 94*  CO2 32  GLUCOSE 101*  BUN 10  CREATININE 0.75  CALCIUM 9.0  MG 1.9    Recent Labs  11/12/16 0645  AST 32  ALT 18  ALKPHOS 53  BILITOT 1.2  PROT 6.7  ALBUMIN 3.3*    Recent Labs  11/12/16 0645  WBC 8.1  NEUTROABS 5.6  HGB 11.1*  HCT 32.3*  MCV 86.7  PLT 273   No results found for: TSH No results found for: HGBA1C No results found for: CHOL, HDL, LDLCALC, LDLDIRECT, TRIG, CHOLHDL  Significant Diagnostic Results in last 30 days:  No results found.  Assessment/Plan 1. Spinal stenosis, lumbar region, with  neurogenic claudication  Continue working with PT/OT  Continue oxycodone 1-2 tablets PO Q 3 hours prn pain  DC Gabapentin to 300 mg po TID- caused hallucinations  Start Lyrica 25 mg po Q 8 hours  Naproxyn 250 mg po Q 12 hours for pain  Ice to area prn  2. Cellulitis wound, post operative  Clindamycin 300 mg po Q 8 hours x 7 days  Continue dressing changes as instructed  Family/ staff Communication:   Total Time:  Documentation:  Face to Face:  Family/Phone:   Labs/tests ordered:    Medication list reviewed and assessed for continued appropriateness. Monthly medication orders reviewed  and signed.  Vikki Ports, NP-C Geriatrics [facility] [phone], [place] [address] Cell Phone (Mon-Fri 8am-5pm):  [phone] On Call:  [phone] & follow prompts after 5pm & weekends Office Phone:  [phone] Office Fax:  [phone]

## 2016-11-24 ENCOUNTER — Non-Acute Institutional Stay (SKILLED_NURSING_FACILITY): Payer: Medicare Other | Admitting: Gerontology

## 2016-11-24 DIAGNOSIS — T814XXD Infection following a procedure, subsequent encounter: Secondary | ICD-10-CM | POA: Diagnosis not present

## 2016-11-24 DIAGNOSIS — M48062 Spinal stenosis, lumbar region with neurogenic claudication: Secondary | ICD-10-CM

## 2016-11-24 DIAGNOSIS — IMO0001 Reserved for inherently not codable concepts without codable children: Secondary | ICD-10-CM

## 2016-12-02 NOTE — Progress Notes (Signed)
Location:      Place of Service:  SNF (31) Provider:  Toni Arthurs, NP-C  [name], MD  Patient Care Team: [name], MD as PCP - General (Internal Medicine)  Extended Emergency Contact Information Primary Emergency Contact: Horen,James Address: [address]          [place], [place] [address] [place] Phone: [phone] Relation: Spouse Secondary Emergency Contact: Donna Bernard Address: [address]          [place], [place] [id number] [place] Phone: [phone] Relation: Daughter  Code Status:  full Goals of care: Advanced Directive information Advanced Directives 08/18/2016  Does Patient Have a Medical Advance Directive? No  Type of Advance Directive -  Does patient want to make changes to medical advance directive? -  Copy of [place] in Chart? -  Would patient like information on creating a medical advance directive? -     Chief Complaint  Patient presents with  . Follow-up    HPI:  Pt is a 83 y.o. female seen today for follow up visit s/p admission to the facility for rehab following surgical intervention for spinal stenosis (Lumbar Laminectomy). Pt has been participating in PT/OT. She is walking fairly well with the assistance of a walker. Pt is on O2 2L [place]. Pt reports she is having worsening nerve pain. Naproxen having some improvement. Appetite is good, eating well. Voiding without difficulty, having regular BMs. Incision will moderate amount of serous drainage from small opening. Dark red tissue surrounding the wound. Pt does not report any extra tenderness in this area, but overall reports pain as 8/10, dull, constant. Will monitor for effectiveness of Lyrica, to be titrated for effectiveness. VSS. No other complaints.     Past Medical History:  Diagnosis Date  . Asthma   . Breast cancer ([place]) 2011  . Bronchitis 04/2015  . Cancer [facility]) 2011   breast  . COPD (chronic obstructive  pulmonary disease) ([place])   . Hypertension   . Shingles    10/2015   Past Surgical History:  Procedure Laterality Date  . BREAST EXCISIONAL BIOPSY Right 11/29/13   two areas  . BREAST EXCISIONAL BIOPSY Right 10/30/2009   lumpectomy - radiation    Allergies  Allergen Reactions  . Penicillins Hives  . Captopril Other (See Comments)  . Enalapril Maleate     Other reaction(s): Unknown  . Seldane  [Terfenadine] Other (See Comments)  . Tape Itching  . Augmentin [Amoxicillin-Pot Clavulanate] Rash  . Biaxin [Clarithromycin] Rash, Other (See Comments) and Hives    Other reaction(s): Unknown    Allergies as of 11/19/2016      Reactions   Penicillins Hives   Captopril Other (See Comments)   Enalapril Maleate    Other reaction(s): Unknown   Seldane  [terfenadine] Other (See Comments)   Tape Itching   Augmentin [amoxicillin-pot Clavulanate] Rash   Biaxin [clarithromycin] Rash, Other (See Comments), Hives   Other reaction(s): Unknown      Medication List       Accurate as of 11/19/16  3:29 PM. Always use your most recent med list.          ALPRAZolam 0.25 MG tablet Commonly known as:  XANAX Take 0.25 mg by mouth 2 (two) times daily.   XANAX 0.5 MG tablet Generic drug:  ALPRAZolam Take 0.5 mg by mouth 2 (two) times daily as needed for anxiety.   aspirin 81 MG tablet Take 81 mg by mouth  2 (two) times daily.   atorvastatin 20 MG tablet Commonly known as:  LIPITOR Take 20 mg by mouth daily.   Cholecalciferol 1000 UNIT/10ML Liqd Take 1,000 Int'l Units/day by mouth every morning. Reported on 01/20/2016   doxycycline 100 MG capsule Commonly known as:  VIBRAMYCIN Take 1 capsule (100 mg total) by mouth 2 (two) times daily.   esomeprazole 40 MG capsule Commonly known as:  NEXIUM Take 40 mg by mouth daily at 12 noon.   fexofenadine 180 MG tablet Commonly known as:  ALLEGRA Take 180 mg by mouth daily.   fluticasone 50 MCG/ACT nasal spray Commonly known as:  FLONASE Place  into both nostrils daily.   Fluticasone-Salmeterol 100-50 MCG/DOSE Aepb Commonly known as:  ADVAIR Inhale 1 puff into the lungs 2 (two) times daily.   hydrALAZINE 25 MG tablet Commonly known as:  APRESOLINE Take 50 mg by mouth 3 (three) times daily.   HYDROcodone-acetaminophen 5-325 MG tablet Commonly known as:  NORCO/VICODIN Limit 1 tab by mouth 2-3 times per day if tolerated   HYDROcodone-acetaminophen 5-325 MG tablet Commonly known as:  NORCO/VICODIN Limit 1 tab by mouth 2-3 times per day if tolerated   ipratropium 0.02 % nebulizer solution Commonly known as:  ATROVENT Take 0.5 mg by nebulization 4 (four) times daily as needed.   levothyroxine 25 MCG tablet Commonly known as:  SYNTHROID, LEVOTHROID Take 25 mcg by mouth daily before breakfast.   meloxicam 15 MG tablet Commonly known as:  MOBIC Take 15 mg by mouth as needed for pain.   metoprolol 50 MG tablet Commonly known as:  LOPRESSOR Take 50 mg by mouth 2 (two) times daily.   predniSONE 10 MG tablet Commonly known as:  DELTASONE Take 3 tablets once a day for 2 days, take 2 tablets once a day for 2 days, and 1 tablet once a day for 2 days.   tiZANidine 2 MG tablet Commonly known as:  ZANAFLEX Take by mouth 3 (three) times daily. prn for muscle spasm       Review of Systems  Constitutional: Negative for activity change, appetite change, chills, diaphoresis and fever.  HENT: Negative for congestion, sneezing, sore throat, trouble swallowing and voice change.   Respiratory: Negative for apnea, cough, choking, chest tightness, shortness of breath and wheezing.   Cardiovascular: Negative for chest pain, palpitations and leg swelling.  Gastrointestinal: Negative for abdominal distention, abdominal pain, constipation, diarrhea and nausea.  Genitourinary: Negative for difficulty urinating, dysuria, frequency and urgency.  Musculoskeletal: Positive for arthralgias (typical arthritis), back pain, gait problem and  myalgias.  Skin: Positive for wound. Negative for color change, pallor and rash.  Neurological: Negative for dizziness, tremors, syncope, speech difficulty, weakness, numbness and headaches.  Psychiatric/Behavioral: Negative for agitation and behavioral problems.  All other systems reviewed and are negative.    There is no immunization history on file for this patient. Pertinent  Health Maintenance Due  Topic Date Due  . PNA vac Low Risk Adult (1 of 2 - PCV13) 09/25/1999  . INFLUENZA VACCINE  03/17/2017  . DEXA SCAN  Completed   Fall Risk  04/14/2016 03/23/2016 02/12/2016 02/03/2016 01/20/2016  Falls in the past year? No No No No No  Number falls in past yr: - - - - 1  Injury with Fall? - - - - No  Risk Factor Category  - - - - -  Risk for fall due to : - - - - History of fall(s)  Follow up - - - -  Falls prevention discussed   Functional Status Survey:    Vitals:   11/19/16 0500  BP: (!) 132/57  Pulse: 72  Resp: 20  Temp: 97.4 F (36.3 C)  SpO2: 91%   There is no height or weight on file to calculate BMI. Physical Exam  Constitutional: She is oriented to person, place, and time. Vital signs are normal. She appears well-developed and well-nourished. She is active and cooperative. She does not appear ill. No distress.  HENT:  Head: Normocephalic and atraumatic.  Mouth/Throat: Uvula is midline, oropharynx is clear and moist and mucous membranes are normal. Mucous membranes are not pale, not dry and not cyanotic.  Eyes: Conjunctivae, EOM and lids are normal. Pupils are equal, round, and reactive to light.  Neck: Trachea normal, normal range of motion and full passive range of motion without pain. Neck supple. No JVD present. No tracheal deviation, no edema and no erythema present. No thyromegaly present.  Cardiovascular: Normal rate, regular rhythm, normal heart sounds, intact distal pulses and normal pulses.  Exam reveals no gallop, no distant heart sounds and no friction rub.   No  murmur heard. Pulmonary/Chest: Effort normal and breath sounds normal. No accessory muscle usage. No respiratory distress. She has no wheezes. She has no rales. She exhibits no tenderness.  Abdominal: Normal appearance and bowel sounds are normal. She exhibits no distension and no ascites. There is no tenderness.  Musculoskeletal: She exhibits no edema or tenderness.       Lumbar back: She exhibits decreased range of motion, laceration (surgical incision) and pain.       Back:  Expected osteoarthritis, stiffness; calves soft, supple. Negative Homan's sign.  Neurological: She is alert and oriented to person, place, and time. She has normal strength.  Skin: Skin is warm and dry. Laceration (lumbar incision) noted. No rash noted. She is not diaphoretic. No cyanosis or erythema. No pallor. Nails show no clubbing.  Psychiatric: She has a normal mood and affect. Her speech is normal and behavior is normal. Judgment and thought content normal. Cognition and memory are normal.  Nursing note and vitals reviewed.   Labs reviewed:  Recent Labs  11/12/16 0645  NA 133*  K 4.5  CL 94*  CO2 32  GLUCOSE 101*  BUN 10  CREATININE 0.75  CALCIUM 9.0  MG 1.9    Recent Labs  11/12/16 0645  AST 32  ALT 18  ALKPHOS 53  BILITOT 1.2  PROT 6.7  ALBUMIN 3.3*    Recent Labs  11/12/16 0645  WBC 8.1  NEUTROABS 5.6  HGB 11.1*  HCT 32.3*  MCV 86.7  PLT 273   No results found for: TSH No results found for: HGBA1C No results found for: CHOL, HDL, LDLCALC, LDLDIRECT, TRIG, CHOLHDL  Significant Diagnostic Results in last 30 days:  No results found.  Assessment/Plan 1. Spinal stenosis, lumbar region, with neurogenic claudication  Continue working with PT/OT  Continue oxycodone 1-2 tablets PO Q 3 hours prn pain  Continue Lyrica 25 mg po Q 8 hours  Continue Naproxyn 250 mg po Q 12 hours for pain  Ice to area prn  2. Cellulitis wound, post operative  Clindamycin 300 mg po Q 8 hours x  2 more days  Continue dressing changes as instructed  Family/ staff Communication:   Total Time:  Documentation:  Face to Face:  Family/Phone:   Labs/tests ordered:    Medication list reviewed and assessed for continued appropriateness. Monthly medication orders reviewed and signed.  Vikki Ports, NP-C Geriatrics [facility] [phone], [place] [address] Cell Phone (Mon-Fri 8am-5pm):  [phone] On Call:  [phone] & follow prompts after 5pm & weekends Office Phone:  [phone] Office Fax:  [phone]

## 2016-12-09 ENCOUNTER — Other Ambulatory Visit: Payer: Medicare Other

## 2016-12-09 ENCOUNTER — Ambulatory Visit: Payer: Medicare Other

## 2016-12-11 ENCOUNTER — Other Ambulatory Visit: Payer: Self-pay | Admitting: Internal Medicine

## 2016-12-11 DIAGNOSIS — E039 Hypothyroidism, unspecified: Secondary | ICD-10-CM

## 2016-12-11 DIAGNOSIS — R4182 Altered mental status, unspecified: Secondary | ICD-10-CM

## 2016-12-14 ENCOUNTER — Emergency Department
Admission: EM | Admit: 2016-12-14 | Discharge: 2016-12-14 | Disposition: A | Discharge status: Home / Self Care | Payer: Medicare Other | Attending: Emergency Medicine | Admitting: Emergency Medicine

## 2016-12-14 ENCOUNTER — Encounter: Payer: Self-pay | Admitting: Emergency Medicine

## 2016-12-14 ENCOUNTER — Emergency Department: Payer: Medicare Other

## 2016-12-14 DIAGNOSIS — I10 Essential (primary) hypertension: Secondary | ICD-10-CM | POA: Diagnosis not present

## 2016-12-14 DIAGNOSIS — Z79899 Other long term (current) drug therapy: Secondary | ICD-10-CM | POA: Insufficient documentation

## 2016-12-14 DIAGNOSIS — Z853 Personal history of malignant neoplasm of breast: Secondary | ICD-10-CM | POA: Diagnosis not present

## 2016-12-14 DIAGNOSIS — J42 Unspecified chronic bronchitis: Secondary | ICD-10-CM | POA: Insufficient documentation

## 2016-12-14 DIAGNOSIS — J45909 Unspecified asthma, uncomplicated: Secondary | ICD-10-CM | POA: Diagnosis not present

## 2016-12-14 DIAGNOSIS — R5383 Other fatigue: Secondary | ICD-10-CM | POA: Diagnosis present

## 2016-12-14 DIAGNOSIS — Z7982 Long term (current) use of aspirin: Secondary | ICD-10-CM | POA: Diagnosis not present

## 2016-12-14 DIAGNOSIS — R4182 Altered mental status, unspecified: Secondary | ICD-10-CM | POA: Insufficient documentation

## 2016-12-14 LAB — URINALYSIS, COMPLETE (UACMP) WITH MICROSCOPIC
BACTERIA UA: NONE SEEN
Bilirubin Urine: NEGATIVE
GLUCOSE, UA: NEGATIVE mg/dL
Hgb urine dipstick: NEGATIVE
Ketones, ur: NEGATIVE mg/dL
Leukocytes, UA: NEGATIVE
NITRITE: NEGATIVE
PROTEIN: 30 mg/dL — AB
SQUAMOUS EPITHELIAL / LPF: NONE SEEN
Specific Gravity, Urine: 1.021 (ref 1.005–1.030)
pH: 5 (ref 5.0–8.0)

## 2016-12-14 LAB — CBC
HCT: 37.1 % (ref 35.0–47.0)
HEMOGLOBIN: 12.2 g/dL (ref 12.0–16.0)
MCH: 29.2 pg (ref 26.0–34.0)
MCHC: 33 g/dL (ref 32.0–36.0)
MCV: 88.5 fL (ref 80.0–100.0)
Platelets: 186 10*3/uL (ref 150–440)
RBC: 4.19 MIL/uL (ref 3.80–5.20)
RDW: 16.5 % — AB (ref 11.5–14.5)
WBC: 8.7 10*3/uL (ref 3.6–11.0)

## 2016-12-14 LAB — COMPREHENSIVE METABOLIC PANEL
ALBUMIN: 4.1 g/dL (ref 3.5–5.0)
ALT: 19 U/L (ref 14–54)
ANION GAP: 10 (ref 5–15)
AST: 37 U/L (ref 15–41)
Alkaline Phosphatase: 87 U/L (ref 38–126)
BUN: 18 mg/dL (ref 6–20)
CO2: 25 mmol/L (ref 22–32)
Calcium: 9.1 mg/dL (ref 8.9–10.3)
Chloride: 101 mmol/L (ref 101–111)
Creatinine, Ser: 1.19 mg/dL — ABNORMAL HIGH (ref 0.44–1.00)
GFR calc Af Amer: 48 mL/min — ABNORMAL LOW (ref >60)
GFR calc non Af Amer: 41 mL/min — ABNORMAL LOW (ref >60)
GLUCOSE: 101 mg/dL — AB (ref 65–99)
POTASSIUM: 4 mmol/L (ref 3.5–5.1)
SODIUM: 136 mmol/L (ref 135–145)
Total Bilirubin: 1.6 mg/dL — ABNORMAL HIGH (ref 0.3–1.2)
Total Protein: 7.7 g/dL (ref 6.5–8.1)

## 2016-12-14 MED ORDER — PREDNISONE 20 MG PO TABS
60.0000 mg | ORAL_TABLET | Freq: Once | ORAL | Status: AC
Start: 2016-12-14 — End: 2016-12-14
  Administered 2016-12-14: 60 mg via ORAL
  Filled 2016-12-14: qty 3

## 2016-12-14 MED ORDER — PREDNISONE 10 MG PO TABS: 50.0000 mg | ORAL_TABLET | Freq: Every day | ORAL | 0 refills | Status: DC

## 2016-12-14 MED ORDER — DOXYCYCLINE HYCLATE 100 MG PO TABS
100.0000 mg | ORAL_TABLET | Freq: Once | ORAL | Status: AC
  Administered 2016-12-14: 100 mg via ORAL
  Filled 2016-12-14: qty 1

## 2016-12-14 MED ORDER — DOXYCYCLINE HYCLATE 50 MG PO CAPS: 100.0000 mg | ORAL_CAPSULE | Freq: Two times a day (BID) | ORAL | 0 refills | Status: DC

## 2016-12-14 NOTE — ED Notes (Addendum)
Pt husband states that this AM pt was disoriented, states it comes and goes. States "she has a brilliant memory."   Pt states only taking 2 oxycodone since being released from [facility]. Husband states "she hasn't asked for them so I haven't given them to her."

## 2016-12-14 NOTE — ED Notes (Addendum)
Pt states back surgery on discs and nerve in March. Went to [facility]. States she has been getting weaker and gets SOB with walking. Pt has hx of COPD per husband. Pt states nausea and pain. Pt talking in complete sentences, somewhat slurred. Pt alert and oriented, answers all questions appropriately.

## 2016-12-14 NOTE — ED Notes (Signed)
Dr. Rifenbark at bedside 

## 2016-12-14 NOTE — ED Notes (Signed)
Pt lifted hips and rolled to replace underwear and pants on pt. While rolling oxygen dropped and immediately came back up once finished moving.

## 2016-12-14 NOTE — ED Provider Notes (Signed)
[facility] Emergency Department Provider Note  ____________________________________________   First MD Initiated Contact with Patient 12/14/16 1537     (approximate)  I have reviewed the triage vital signs and the nursing notes.   HISTORY  Chief Complaint Altered Mental Status    HPI Janice Perkins is a 83 y.o. female who comes to the emergency department with about a week of generalized fatigue and malaise as well as nausea and an episode of altered mental status this morning. About a month ago the patient had spinal surgery for which she hadan 8 day hospital stay and then a 3 week stay in rehabilitation when she left about 8 days ago. The patient has been persistently nauseated since and is felt unlikely herself. The patient's husband said that early this morning she had an episode of altered mental status which concerned him. She denies fevers or chills. She denies chest pain or shortness of breath. She denies abdominal pain nausea or vomiting. She says she feels well right now and would prefer to go home. According to the husband the patient's altered mental status occurred earlier today, was brief, and is now resolved.   Past Medical History:  Diagnosis Date  . Asthma   . Breast cancer ([place]) 2011  . Bronchitis 04/2015  . Cancer [facility]) 2011   breast  . COPD (chronic obstructive pulmonary disease) ([place])   . Hypertension   . Shingles    10/2015    Patient Active Problem List   Diagnosis Date Noted  . Primary cancer of upper outer quadrant of right female breast ([place]) 04/07/2016  . Lumbar radiculopathy 03/23/2016  . Spinal stenosis, lumbar region, with neurogenic claudication 03/23/2016  . DDD (degenerative disc disease), lumbar 01/14/2015  . Lumbosacral facet joint syndrome ([place]) 01/14/2015  . Sacroiliac joint dysfunction 01/14/2015  . Greater trochanteric bursitis 01/14/2015  . Bilateral occipital neuralgia 01/14/2015    Past Surgical History:   Procedure Laterality Date  . BREAST EXCISIONAL BIOPSY Right 11/29/13   two areas  . BREAST EXCISIONAL BIOPSY Right 10/30/2009   lumpectomy - radiation    Prior to Admission medications   Medication Sig Start Date End Date Taking? Authorizing Provider  ALPRAZolam (XANAX) 0.25 MG tablet Take 0.25 mg by mouth 2 (two) times daily.    Historical Provider, MD  ALPRAZolam Duanne Moron) 0.5 MG tablet Take 0.5 mg by mouth 2 (two) times daily as needed for anxiety.    Historical Provider, MD  aspirin 81 MG tablet Take 81 mg by mouth 2 (two) times daily.     Historical Provider, MD  atorvastatin (LIPITOR) 20 MG tablet Take 20 mg by mouth daily.    Historical Provider, MD  Cholecalciferol 1000 UNIT/10ML LIQD Take 1,000 Int'l Units/day by mouth every morning. Reported on 01/20/2016    Historical Provider, MD  doxycycline (VIBRAMYCIN) 50 MG capsule Take 2 capsules (100 mg total) by mouth 2 (two) times daily. 12/14/16 12/21/16  Darel Hong, MD  esomeprazole (NEXIUM) 40 MG capsule Take 40 mg by mouth daily at 12 noon.    Historical Provider, MD  fexofenadine (ALLEGRA) 180 MG tablet Take 180 mg by mouth daily.    Historical Provider, MD  fluticasone (FLONASE) 50 MCG/ACT nasal spray Place into both nostrils daily.    Historical Provider, MD  Fluticasone-Salmeterol (ADVAIR) 100-50 MCG/DOSE AEPB Inhale 1 puff into the lungs 2 (two) times daily.    Historical Provider, MD  hydrALAZINE (APRESOLINE) 25 MG tablet Take 50 mg by mouth 3 (  three) times daily.     Historical Provider, MD  HYDROcodone-acetaminophen (NORCO/VICODIN) 5-325 MG tablet Limit 1 tab by mouth 2-3 times per day if tolerated 04/14/16   Mohammed Kindle, MD  HYDROcodone-acetaminophen (NORCO/VICODIN) 5-325 MG tablet Limit 1 tab by mouth 2-3 times per day if tolerated 04/14/16   Mohammed Kindle, MD  ipratropium (ATROVENT) 0.02 % nebulizer solution Take 0.5 mg by nebulization 4 (four) times daily as needed.     Historical Provider, MD  levothyroxine (SYNTHROID,  LEVOTHROID) 25 MCG tablet Take 25 mcg by mouth daily before breakfast.    Historical Provider, MD  meloxicam (MOBIC) 15 MG tablet Take 15 mg by mouth as needed for pain.    Historical Provider, MD  metoprolol (LOPRESSOR) 50 MG tablet Take 50 mg by mouth 2 (two) times daily.    Historical Provider, MD  predniSONE (DELTASONE) 10 MG tablet Take 5 tablets (50 mg total) by mouth daily. 12/14/16 12/18/16  Darel Hong, MD  tiZANidine (ZANAFLEX) 2 MG tablet Take by mouth 3 (three) times daily. prn for muscle spasm    Historical Provider, MD    Allergies Penicillins; Captopril; Enalapril maleate; Seldane  [terfenadine]; Tape; Augmentin [amoxicillin-pot clavulanate]; and Biaxin [clarithromycin]  Family History  Problem Relation Age of Onset  . Hypertension Mother   . Heart disease Mother   . Cancer Mother   . Stroke Father   . Hypertension Father   . Breast cancer Sister     Social History Social History  Substance Use Topics  . Smoking status: Never Smoker  . Smokeless tobacco: Never Used  . Alcohol use No    Review of Systems Constitutional: No fever/chills Eyes: No visual changes. ENT: No sore throat. Cardiovascular: Denies chest pain. Respiratory: Denies shortness of breath. Gastrointestinal: No abdominal pain.  No nausea, no vomiting.  No diarrhea.  No constipation. Genitourinary: Negative for dysuria. Musculoskeletal: Negative for back pain. Skin: Negative for rash. Neurological: Negative for headaches, focal weakness or numbness.  10-point ROS otherwise negative.  ____________________________________________   PHYSICAL EXAM:  VITAL SIGNS: ED Triage Vitals  Enc Vitals Group     BP 12/14/16 1521 (!) 170/123     Pulse Rate 12/14/16 1521 93     Resp 12/14/16 1521 16     Temp 12/14/16 1521 98.2 F (36.8 C)     Temp Source 12/14/16 1521 Oral     SpO2 12/14/16 1521 95 %     Weight 12/14/16 1522 179 lb (81.2 kg)     Height --      Head Circumference --      Peak Flow  --      Pain Score 12/14/16 1521 0     Pain Loc --      Pain Edu? --      Excl. in [address]? --     Constitutional: Alert and oriented x 4 well appearing nontoxic no diaphoresis speaks in full, clear sentences Eyes: PERRL EOMI. Head: Atraumatic. Nose: No congestion/rhinnorhea. Mouth/Throat: No trismus Neck: No stridor.   Cardiovascular: Normal rate, regular rhythm. Grossly normal heart sounds.  Good peripheral circulation. Respiratory: Normal respiratory effort.  No retractions. Lungs CTAB and moving good air Gastrointestinal: Soft nontender Musculoskeletal: No lower extremity edema   Neurologic:  Normal speech and language. No gross focal neurologic deficits are appreciated. Skin:  Skin is warm, dry and intact. No rash noted. Psychiatric: Mood and affect are normal. Speech and behavior are normal.    ____________________________________________   DIFFERENTIAL  Metabolic arrangement, intracerebral  hemorrhage, intracerebral mass, hydrocephalus, urinary tract infection ____________________________________________   LABS (all labs ordered are listed, but only abnormal results are displayed)  Labs Reviewed  COMPREHENSIVE METABOLIC PANEL - Abnormal; Notable for the following:       Result Value   Glucose, Bld 101 (*)    Creatinine, Ser 1.19 (*)    Total Bilirubin 1.6 (*)    GFR calc non Af Amer 41 (*)    GFR calc Af Amer 48 (*)    All other components within normal limits  CBC - Abnormal; Notable for the following:    RDW 16.5 (*)    All other components within normal limits  URINALYSIS, COMPLETE (UACMP) WITH MICROSCOPIC - Abnormal; Notable for the following:    Color, Urine AMBER (*)    APPearance CLEAR (*)    Protein, ur 30 (*)    All other components within normal limits  CBG MONITORING, ED    No signs of urinary tract infection labs all unremarkable __________________________________________  EKG  ED ECG REPORT I, Darel Hong, the attending physician, personally  viewed and interpreted this ECG.  Date: 12/14/2016 Rate: 86 Rhythm: Atrial fibrillation QRS Axis: Leftward Intervals: normal ST/T Wave abnormalities: normal Conduction Disturbances: none Narrative Interpretation: Abnormal  ____________________________________________  RADIOLOGY  Head CT with no acute disease chest x-ray with no acute disease ____________________________________________   PROCEDURES  Procedure(s) performed: no  Procedures  Critical Care performed: no  Observation: no ____________________________________________   INITIAL IMPRESSION / ASSESSMENT AND PLAN / ED COURSE  Pertinent labs & imaging results that were available during my care of the patient were reviewed by me and considered in my medical decision making (see chart for details).  On arrival the patient is very well-appearing with an essentially normal exam. Unclear etiology of her symptoms but urinalysis, head CT, and general labs are pending.     ----------------------------------------- 6:17 PM on 12/14/2016 -----------------------------------------  According to the patient's husband she is back to her baseline and feels well. She does have some wheezing throughout so I will treat her for COPD exacerbation. Fortunately her urinalysis is normal and her head CT is unremarkable. At this point I'm comfortable discharging her home with primary care recheck in 2 days and outpatient MRI as scheduled on Friday. ____________________________________________   FINAL CLINICAL IMPRESSION(S) / ED DIAGNOSES  Final diagnoses:  Chronic bronchitis, unspecified chronic bronchitis type ([place])  Fatigue, unspecified type      NEW MEDICATIONS STARTED DURING THIS VISIT:  Discharge Medication List as of 12/14/2016  6:17 PM       Note:  This document was prepared using Dragon voice recognition software and may include unintentional dictation errors.     Darel Hong, MD 12/15/16 [phone]

## 2016-12-14 NOTE — ED Triage Notes (Signed)
Spouse states patient is disoriented.  Recent back surgery at [facility] and stayed at [facility] rehab for a few weeks.  Seen by Dr. Lawerance Cruel on Friday for confusion.  Husband states confusion is worse today.  AAOx3.  Skin warm and dry.

## 2016-12-14 NOTE — Discharge Instructions (Signed)
Please take all of your steroids and antibiotics as prescribed and follow-up with your primary care physician in 2 days for recheck.  Return to the ED for any concerns such as worsening shortness or breath, fevers, chills, or for any other concerns.  Please keep your appointment for your MRI this Friday as scheduled.  It was a pleasure to take care of you today, and thank you for coming to our emergency department.  If you have any questions or concerns before leaving please ask the nurse to grab me and I'm more than happy to go through your aftercare instructions again.  If you were prescribed any opioid pain medication today such as Norco, Vicodin, Percocet, morphine, hydrocodone, or oxycodone please make sure you do not drive when you are taking this medication as it can alter your ability to drive safely.  If you have any concerns once you are home that you are not improving or are in fact getting worse before you can make it to your follow-up appointment, please do not hesitate to call 911 and come back for further evaluation.  Darel Hong MD  Results for orders placed or performed during the hospital encounter of 12/14/16  Comprehensive metabolic panel  Result Value Ref Range   Sodium 136 135 - 145 mmol/L   Potassium 4.0 3.5 - 5.1 mmol/L   Chloride 101 101 - 111 mmol/L   CO2 25 22 - 32 mmol/L   Glucose, Bld 101 (H) 65 - 99 mg/dL   BUN 18 6 - 20 mg/dL   Creatinine, Ser 1.19 (H) 0.44 - 1.00 mg/dL   Calcium 9.1 8.9 - 10.3 mg/dL   Total Protein 7.7 6.5 - 8.1 g/dL   Albumin 4.1 3.5 - 5.0 g/dL   AST 37 15 - 41 U/L   ALT 19 14 - 54 U/L   Alkaline Phosphatase 87 38 - 126 U/L   Total Bilirubin 1.6 (H) 0.3 - 1.2 mg/dL   GFR calc non Af Amer 41 (L) >60 mL/min   GFR calc Af Amer 48 (L) >60 mL/min   Anion gap 10 5 - 15  CBC  Result Value Ref Range   WBC 8.7 3.6 - 11.0 K/uL   RBC 4.19 3.80 - 5.20 MIL/uL   Hemoglobin 12.2 12.0 - 16.0 g/dL   HCT 37.1 35.0 - 47.0 %   MCV 88.5 80.0 - 100.0  fL   MCH 29.2 26.0 - 34.0 pg   MCHC 33.0 32.0 - 36.0 g/dL   RDW 16.5 (H) 11.5 - 14.5 %   Platelets 186 150 - 440 K/uL  Urinalysis, Complete w Microscopic  Result Value Ref Range   Color, Urine AMBER (A) YELLOW   APPearance CLEAR (A) CLEAR   Specific Gravity, Urine 1.021 1.005 - 1.030   pH 5.0 5.0 - 8.0   Glucose, UA NEGATIVE NEGATIVE mg/dL   Hgb urine dipstick NEGATIVE NEGATIVE   Bilirubin Urine NEGATIVE NEGATIVE   Ketones, ur NEGATIVE NEGATIVE mg/dL   Protein, ur 30 (A) NEGATIVE mg/dL   Nitrite NEGATIVE NEGATIVE   Leukocytes, UA NEGATIVE NEGATIVE   RBC / HPF 0-5 0 - 5 RBC/hpf   WBC, UA 0-5 0 - 5 WBC/hpf   Bacteria, UA NONE SEEN NONE SEEN   Squamous Epithelial / LPF NONE SEEN NONE SEEN   Mucous PRESENT    Hyaline Casts, UA PRESENT    Dg Chest 2 View  Result Date: 12/14/2016 CLINICAL DATA:  83 year old currently in rehabilitation after lumbar surgery, presenting with  progressive generalized weakness and shortness of breath with walking. Nonsmoker. Current history of asthma. Personal history of right breast cancer post lumpectomy in 2011. EXAM: CHEST  2 VIEW COMPARISON:  06/01/2016, 08/18/2015. FINDINGS: Cardiac silhouette moderately enlarged, unchanged. Thoracic aorta atherosclerotic, unchanged. Hilar and mediastinal contours otherwise unremarkable. Suboptimal inspiration which accounts for crowded bronchovascular markings in the lung bases. Prominent bronchovascular markings diffusely and mild to moderate central peribronchial thickening, unchanged. Lungs otherwise clear. No localized airspace consolidation. No pleural effusions. No pneumothorax. Normal pulmonary vascularity. Degenerative changes involving the thoracic and upper lumbar spine. Prior left shoulder arthroplasty with anatomic alignment. IMPRESSION: 1. Suboptimal inspiration. No acute cardiopulmonary disease. Stable mild-to-moderate changes of chronic bronchitis and/or asthma. 2. Stable moderate cardiomegaly without pulmonary  edema. Electronically Signed   By: Evangeline Dakin M.D.   On: 12/14/2016 17:13   Ct Head Wo Contrast  Result Date: 12/14/2016 CLINICAL DATA:  Altered mental status.  Recent back surgery. EXAM: CT HEAD WITHOUT CONTRAST TECHNIQUE: Contiguous axial images were obtained from the base of the skull through the vertex without intravenous contrast. COMPARISON:  Brain MRI 03/16/2013 FINDINGS: Brain: No evidence of acute infarction, hemorrhage, hydrocephalus, extra-axial collection or mass lesion/mass effect. Mild for age generalized cerebral volume loss. Dilated perivascular spaces below the putamina. Mild periventricular chronic microvascular ischemic change. Vascular: No acute finding. Skull: No acute or aggressive finding. Sinuses/Orbits: Status post endoscopic sinus surgery cataract resections. No acute finding. IMPRESSION: Senescent changes without acute superimposed finding. Electronically Signed   By: Monte Fantasia M.D.   On: 12/14/2016 16:51

## 2016-12-17 ENCOUNTER — Emergency Department: Payer: Medicare Other

## 2016-12-17 ENCOUNTER — Inpatient Hospital Stay
Admission: EM | Admit: 2016-12-17 | Discharge: 2016-12-22 | DRG: 194 | Disposition: A | Discharge status: Skilled Nursing Facility | Payer: Medicare Other | Attending: Internal Medicine | Admitting: Internal Medicine

## 2016-12-17 DIAGNOSIS — E039 Hypothyroidism, unspecified: Secondary | ICD-10-CM | POA: Diagnosis present

## 2016-12-17 DIAGNOSIS — Z79899 Other long term (current) drug therapy: Secondary | ICD-10-CM

## 2016-12-17 DIAGNOSIS — Z803 Family history of malignant neoplasm of breast: Secondary | ICD-10-CM

## 2016-12-17 DIAGNOSIS — N179 Acute kidney failure, unspecified: Secondary | ICD-10-CM | POA: Diagnosis present

## 2016-12-17 DIAGNOSIS — R471 Dysarthria and anarthria: Secondary | ICD-10-CM | POA: Diagnosis present

## 2016-12-17 DIAGNOSIS — Z823 Family history of stroke: Secondary | ICD-10-CM

## 2016-12-17 DIAGNOSIS — M5136 Other intervertebral disc degeneration, lumbar region: Secondary | ICD-10-CM

## 2016-12-17 DIAGNOSIS — J189 Pneumonia, unspecified organism: Secondary | ICD-10-CM | POA: Diagnosis not present

## 2016-12-17 DIAGNOSIS — I161 Hypertensive emergency: Secondary | ICD-10-CM | POA: Diagnosis present

## 2016-12-17 DIAGNOSIS — I129 Hypertensive chronic kidney disease with stage 1 through stage 4 chronic kidney disease, or unspecified chronic kidney disease: Secondary | ICD-10-CM | POA: Diagnosis present

## 2016-12-17 DIAGNOSIS — Z7983 Long term (current) use of bisphosphonates: Secondary | ICD-10-CM

## 2016-12-17 DIAGNOSIS — Z853 Personal history of malignant neoplasm of breast: Secondary | ICD-10-CM

## 2016-12-17 DIAGNOSIS — Z7982 Long term (current) use of aspirin: Secondary | ICD-10-CM

## 2016-12-17 DIAGNOSIS — M7060 Trochanteric bursitis, unspecified hip: Secondary | ICD-10-CM

## 2016-12-17 DIAGNOSIS — I674 Hypertensive encephalopathy: Secondary | ICD-10-CM

## 2016-12-17 DIAGNOSIS — M7551 Bursitis of right shoulder: Secondary | ICD-10-CM

## 2016-12-17 DIAGNOSIS — M25511 Pain in right shoulder: Secondary | ICD-10-CM

## 2016-12-17 DIAGNOSIS — J440 Chronic obstructive pulmonary disease with acute lower respiratory infection: Secondary | ICD-10-CM | POA: Diagnosis present

## 2016-12-17 DIAGNOSIS — R278 Other lack of coordination: Secondary | ICD-10-CM | POA: Diagnosis present

## 2016-12-17 DIAGNOSIS — M51369 Other intervertebral disc degeneration, lumbar region without mention of lumbar back pain or lower extremity pain: Secondary | ICD-10-CM

## 2016-12-17 DIAGNOSIS — F05 Delirium due to known physiological condition: Secondary | ICD-10-CM | POA: Diagnosis present

## 2016-12-17 DIAGNOSIS — R748 Abnormal levels of other serum enzymes: Secondary | ICD-10-CM | POA: Diagnosis present

## 2016-12-17 DIAGNOSIS — M533 Sacrococcygeal disorders, not elsewhere classified: Secondary | ICD-10-CM

## 2016-12-17 DIAGNOSIS — K219 Gastro-esophageal reflux disease without esophagitis: Secondary | ICD-10-CM | POA: Diagnosis present

## 2016-12-17 DIAGNOSIS — G8929 Other chronic pain: Secondary | ICD-10-CM

## 2016-12-17 DIAGNOSIS — Z8249 Family history of ischemic heart disease and other diseases of the circulatory system: Secondary | ICD-10-CM

## 2016-12-17 DIAGNOSIS — J181 Lobar pneumonia, unspecified organism: Secondary | ICD-10-CM

## 2016-12-17 DIAGNOSIS — M19011 Primary osteoarthritis, right shoulder: Secondary | ICD-10-CM

## 2016-12-17 DIAGNOSIS — M5416 Radiculopathy, lumbar region: Secondary | ICD-10-CM

## 2016-12-17 DIAGNOSIS — I639 Cerebral infarction, unspecified: Secondary | ICD-10-CM

## 2016-12-17 DIAGNOSIS — M47817 Spondylosis without myelopathy or radiculopathy, lumbosacral region: Secondary | ICD-10-CM

## 2016-12-17 DIAGNOSIS — R0602 Shortness of breath: Secondary | ICD-10-CM

## 2016-12-17 DIAGNOSIS — B0229 Other postherpetic nervous system involvement: Secondary | ICD-10-CM

## 2016-12-17 DIAGNOSIS — Z7951 Long term (current) use of inhaled steroids: Secondary | ICD-10-CM

## 2016-12-17 DIAGNOSIS — R4182 Altered mental status, unspecified: Secondary | ICD-10-CM

## 2016-12-17 DIAGNOSIS — G4700 Insomnia, unspecified: Secondary | ICD-10-CM | POA: Diagnosis present

## 2016-12-17 DIAGNOSIS — E860 Dehydration: Secondary | ICD-10-CM | POA: Diagnosis present

## 2016-12-17 DIAGNOSIS — N183 Chronic kidney disease, stage 3 (moderate): Secondary | ICD-10-CM | POA: Diagnosis present

## 2016-12-17 DIAGNOSIS — S32000A Wedge compression fracture of unspecified lumbar vertebra, initial encounter for closed fracture: Secondary | ICD-10-CM

## 2016-12-17 DIAGNOSIS — I10 Essential (primary) hypertension: Secondary | ICD-10-CM

## 2016-12-17 DIAGNOSIS — M5481 Occipital neuralgia: Secondary | ICD-10-CM

## 2016-12-17 NOTE — ED Triage Notes (Addendum)
Pt presents to ED via EMS from home with c/o sob and "dry mouth". Pt became sob while up to use the restroom just prior to calling EMS. Pt appears to have increased work of breathing at this time. Hx of the same recently with fatigue and confusion for the past week and pt was seen in this ED on the 30th. Pt is scheduled to have an MRI on Friday. currently a breast CA patient. Pt 90% on room air upon arrival to ED. Uses O2 at night as needed.

## 2016-12-18 ENCOUNTER — Emergency Department: Payer: Medicare Other

## 2016-12-18 ENCOUNTER — Inpatient Hospital Stay
Admit: 2016-12-18 | Discharge: 2016-12-18 | Disposition: A | Discharge status: Home / Self Care | Payer: Medicare Other | Attending: Internal Medicine | Admitting: Internal Medicine

## 2016-12-18 ENCOUNTER — Inpatient Hospital Stay: Payer: Medicare Other

## 2016-12-18 DIAGNOSIS — I161 Hypertensive emergency: Secondary | ICD-10-CM | POA: Diagnosis present

## 2016-12-18 DIAGNOSIS — G4700 Insomnia, unspecified: Secondary | ICD-10-CM | POA: Diagnosis present

## 2016-12-18 DIAGNOSIS — Z803 Family history of malignant neoplasm of breast: Secondary | ICD-10-CM | POA: Diagnosis not present

## 2016-12-18 DIAGNOSIS — Z823 Family history of stroke: Secondary | ICD-10-CM | POA: Diagnosis not present

## 2016-12-18 DIAGNOSIS — N183 Chronic kidney disease, stage 3 (moderate): Secondary | ICD-10-CM | POA: Diagnosis present

## 2016-12-18 DIAGNOSIS — E860 Dehydration: Secondary | ICD-10-CM | POA: Diagnosis present

## 2016-12-18 DIAGNOSIS — G9341 Metabolic encephalopathy: Secondary | ICD-10-CM | POA: Diagnosis not present

## 2016-12-18 DIAGNOSIS — J189 Pneumonia, unspecified organism: Secondary | ICD-10-CM | POA: Diagnosis present

## 2016-12-18 DIAGNOSIS — R471 Dysarthria and anarthria: Secondary | ICD-10-CM | POA: Diagnosis present

## 2016-12-18 DIAGNOSIS — K219 Gastro-esophageal reflux disease without esophagitis: Secondary | ICD-10-CM | POA: Diagnosis present

## 2016-12-18 DIAGNOSIS — R278 Other lack of coordination: Secondary | ICD-10-CM | POA: Diagnosis present

## 2016-12-18 DIAGNOSIS — R748 Abnormal levels of other serum enzymes: Secondary | ICD-10-CM | POA: Diagnosis present

## 2016-12-18 DIAGNOSIS — Z853 Personal history of malignant neoplasm of breast: Secondary | ICD-10-CM | POA: Diagnosis not present

## 2016-12-18 DIAGNOSIS — Z8249 Family history of ischemic heart disease and other diseases of the circulatory system: Secondary | ICD-10-CM | POA: Diagnosis not present

## 2016-12-18 DIAGNOSIS — N179 Acute kidney failure, unspecified: Secondary | ICD-10-CM | POA: Diagnosis present

## 2016-12-18 DIAGNOSIS — I129 Hypertensive chronic kidney disease with stage 1 through stage 4 chronic kidney disease, or unspecified chronic kidney disease: Secondary | ICD-10-CM | POA: Diagnosis present

## 2016-12-18 DIAGNOSIS — Z7982 Long term (current) use of aspirin: Secondary | ICD-10-CM | POA: Diagnosis not present

## 2016-12-18 DIAGNOSIS — Z79899 Other long term (current) drug therapy: Secondary | ICD-10-CM | POA: Diagnosis not present

## 2016-12-18 DIAGNOSIS — I674 Hypertensive encephalopathy: Secondary | ICD-10-CM | POA: Diagnosis present

## 2016-12-18 DIAGNOSIS — J440 Chronic obstructive pulmonary disease with acute lower respiratory infection: Secondary | ICD-10-CM | POA: Diagnosis present

## 2016-12-18 DIAGNOSIS — E039 Hypothyroidism, unspecified: Secondary | ICD-10-CM | POA: Diagnosis present

## 2016-12-18 DIAGNOSIS — Z7983 Long term (current) use of bisphosphonates: Secondary | ICD-10-CM | POA: Diagnosis not present

## 2016-12-18 DIAGNOSIS — Z7951 Long term (current) use of inhaled steroids: Secondary | ICD-10-CM | POA: Diagnosis not present

## 2016-12-18 DIAGNOSIS — F05 Delirium due to known physiological condition: Secondary | ICD-10-CM | POA: Diagnosis present

## 2016-12-18 LAB — CBC WITH DIFFERENTIAL/PLATELET
BASOS PCT: 0 %
Basophils Absolute: 0 10*3/uL (ref 0–0.1)
EOS PCT: 0 %
Eosinophils Absolute: 0 10*3/uL (ref 0–0.7)
HEMATOCRIT: 37.8 % (ref 35.0–47.0)
HEMOGLOBIN: 12.3 g/dL (ref 12.0–16.0)
LYMPHS PCT: 8 %
Lymphs Abs: 1.3 10*3/uL (ref 1.0–3.6)
MCH: 28.2 pg (ref 26.0–34.0)
MCHC: 32.6 g/dL (ref 32.0–36.0)
MCV: 86.4 fL (ref 80.0–100.0)
MONOS PCT: 14 %
Monocytes Absolute: 2.2 10*3/uL — ABNORMAL HIGH (ref 0.2–0.9)
NEUTROS ABS: 12.5 10*3/uL — AB (ref 1.4–6.5)
Neutrophils Relative %: 78 %
Platelets: 267 10*3/uL (ref 150–440)
RBC: 4.38 MIL/uL (ref 3.80–5.20)
RDW: 16.6 % — ABNORMAL HIGH (ref 11.5–14.5)
WBC: 16 10*3/uL — ABNORMAL HIGH (ref 3.6–11.0)

## 2016-12-18 LAB — COMPREHENSIVE METABOLIC PANEL
ALT: 30 U/L (ref 14–54)
ANION GAP: 11 (ref 5–15)
AST: 54 U/L — ABNORMAL HIGH (ref 15–41)
Albumin: 4.2 g/dL (ref 3.5–5.0)
Alkaline Phosphatase: 85 U/L (ref 38–126)
BILIRUBIN TOTAL: 1.1 mg/dL (ref 0.3–1.2)
BUN: 25 mg/dL — AB (ref 6–20)
CO2: 24 mmol/L (ref 22–32)
Calcium: 9.1 mg/dL (ref 8.9–10.3)
Chloride: 101 mmol/L (ref 101–111)
Creatinine, Ser: 1.17 mg/dL — ABNORMAL HIGH (ref 0.44–1.00)
GFR, EST AFRICAN AMERICAN: 49 mL/min — AB (ref >60)
GFR, EST NON AFRICAN AMERICAN: 42 mL/min — AB (ref >60)
Glucose, Bld: 167 mg/dL — ABNORMAL HIGH (ref 65–99)
POTASSIUM: 4.6 mmol/L (ref 3.5–5.1)
Sodium: 136 mmol/L (ref 135–145)
TOTAL PROTEIN: 8 g/dL (ref 6.5–8.1)

## 2016-12-18 LAB — ECHOCARDIOGRAM COMPLETE
Height: 61 in
Weight: 2969.6 oz

## 2016-12-18 LAB — URINALYSIS, COMPLETE (UACMP) WITH MICROSCOPIC
BACTERIA UA: NONE SEEN
BILIRUBIN URINE: NEGATIVE
Glucose, UA: NEGATIVE mg/dL
Ketones, ur: NEGATIVE mg/dL
Leukocytes, UA: NEGATIVE
NITRITE: NEGATIVE
PH: 7 (ref 5.0–8.0)
Protein, ur: 100 mg/dL — AB
SPECIFIC GRAVITY, URINE: 1.006 (ref 1.005–1.030)
Squamous Epithelial / LPF: NONE SEEN

## 2016-12-18 LAB — TROPONIN I: TROPONIN I: 0.03 ng/mL — AB (ref <0.03)

## 2016-12-18 LAB — LIPID PANEL
Cholesterol: 120 mg/dL (ref 0–200)
HDL: 30 mg/dL — ABNORMAL LOW (ref >40)
LDL Cholesterol: 74 mg/dL (ref 0–99)
Total CHOL/HDL Ratio: 4 RATIO
Triglycerides: 81 mg/dL (ref <150)
VLDL: 16 mg/dL (ref 0–40)

## 2016-12-18 LAB — TSH: TSH: 15.21 u[IU]/mL — ABNORMAL HIGH (ref 0.350–4.500)

## 2016-12-18 LAB — LACTIC ACID, PLASMA
LACTIC ACID, VENOUS: 2.6 mmol/L — AB (ref 0.5–1.9)
Lactic Acid, Venous: 1.7 mmol/L (ref 0.5–1.9)

## 2016-12-18 LAB — T4, FREE: FREE T4: 1.13 ng/dL — AB (ref 0.61–1.12)

## 2016-12-18 LAB — MRSA PCR SCREENING: MRSA by PCR: NEGATIVE

## 2016-12-18 MED ORDER — STROKE: EARLY STAGES OF RECOVERY BOOK
Freq: Once | Status: AC
  Administered 2016-12-18: 11:00:00

## 2016-12-18 MED ORDER — QUETIAPINE FUMARATE 25 MG PO TABS
12.5000 mg | ORAL_TABLET | Freq: Every day | ORAL | Status: DC
  Administered 2016-12-19 – 2016-12-20 (×2): 12.5 mg via ORAL
  Filled 2016-12-18 (×2): qty 1

## 2016-12-18 MED ORDER — VITAMIN D 1000 UNITS PO TABS
1000.0000 [IU] | ORAL_TABLET | Freq: Every day | ORAL | Status: DC
  Administered 2016-12-18 – 2016-12-22 (×4): 1000 [IU] via ORAL
  Filled 2016-12-18 (×5): qty 1

## 2016-12-18 MED ORDER — MOMETASONE FURO-FORMOTEROL FUM 100-5 MCG/ACT IN AERO
2.0000 | INHALATION_SPRAY | Freq: Two times a day (BID) | RESPIRATORY_TRACT | Status: DC
  Administered 2016-12-18 – 2016-12-22 (×8): 2 via RESPIRATORY_TRACT
  Filled 2016-12-18: qty 8.8

## 2016-12-18 MED ORDER — MELOXICAM 7.5 MG PO TABS: 15.0000 mg | ORAL_TABLET | Freq: Every day | ORAL | Status: DC | PRN

## 2016-12-18 MED ORDER — SODIUM CHLORIDE 0.9 % IV SOLN
INTRAVENOUS | Status: DC
  Administered 2016-12-18 – 2016-12-20 (×4): via INTRAVENOUS

## 2016-12-18 MED ORDER — VANCOMYCIN HCL IN DEXTROSE 1-5 GM/200ML-% IV SOLN: 1000.0000 mg | Freq: Once | INTRAVENOUS | Status: DC

## 2016-12-18 MED ORDER — LEVOFLOXACIN IN D5W 750 MG/150ML IV SOLN
750.0000 mg | Freq: Once | INTRAVENOUS | Status: AC
  Administered 2016-12-18: 750 mg via INTRAVENOUS
  Filled 2016-12-18: qty 150

## 2016-12-18 MED ORDER — METOPROLOL SUCCINATE ER 50 MG PO TB24
75.0000 mg | ORAL_TABLET | Freq: Two times a day (BID) | ORAL | Status: DC
  Administered 2016-12-19: 08:00:00 75 mg via ORAL
  Filled 2016-12-18 (×2): qty 1

## 2016-12-18 MED ORDER — LABETALOL HCL 5 MG/ML IV SOLN
5.0000 mg | INTRAVENOUS | Status: DC | PRN
  Filled 2016-12-18: qty 4

## 2016-12-18 MED ORDER — HYDROCODONE-ACETAMINOPHEN 5-325 MG PO TABS
1.0000 | ORAL_TABLET | Freq: Four times a day (QID) | ORAL | Status: DC | PRN
Start: 2016-12-18 — End: 2016-12-22

## 2016-12-18 MED ORDER — METOPROLOL SUCCINATE ER 50 MG PO TB24
50.0000 mg | ORAL_TABLET | Freq: Two times a day (BID) | ORAL | Status: DC
  Administered 2016-12-18: 11:00:00 50 mg via ORAL
  Filled 2016-12-18: qty 1

## 2016-12-18 MED ORDER — IPRATROPIUM BROMIDE 0.02 % IN SOLN
0.5000 mg | Freq: Four times a day (QID) | RESPIRATORY_TRACT | Status: DC | PRN
Start: 2016-12-18 — End: 2016-12-22
  Administered 2016-12-19: 15:00:00 0.5 mg via RESPIRATORY_TRACT
  Filled 2016-12-18: qty 2.5

## 2016-12-18 MED ORDER — HALOPERIDOL LACTATE 5 MG/ML IJ SOLN
1.0000 mg | Freq: Four times a day (QID) | INTRAMUSCULAR | Status: DC | PRN
  Administered 2016-12-18 – 2016-12-19 (×3): 1 mg via INTRAVENOUS
  Filled 2016-12-18 (×3): qty 1

## 2016-12-18 MED ORDER — ASPIRIN 325 MG PO TABS
325.0000 mg | ORAL_TABLET | Freq: Every day | ORAL | Status: DC
  Administered 2016-12-18 – 2016-12-22 (×4): 325 mg via ORAL
  Filled 2016-12-18 (×5): qty 1

## 2016-12-18 MED ORDER — HYDRALAZINE HCL 20 MG/ML IJ SOLN
10.0000 mg | Freq: Once | INTRAMUSCULAR | Status: AC
  Administered 2016-12-18: 10 mg via INTRAVENOUS

## 2016-12-18 MED ORDER — ASPIRIN 300 MG RE SUPP: 300.0000 mg | Freq: Every day | RECTAL | Status: DC

## 2016-12-18 MED ORDER — LEVOTHYROXINE SODIUM 100 MCG PO TABS: 100.0000 ug | ORAL_TABLET | Freq: Every day | ORAL | Status: DC

## 2016-12-18 MED ORDER — FLUOXETINE HCL 20 MG PO CAPS
20.0000 mg | ORAL_CAPSULE | Freq: Two times a day (BID) | ORAL | Status: DC
  Administered 2016-12-18 – 2016-12-22 (×7): 20 mg via ORAL
  Filled 2016-12-18 (×8): qty 1

## 2016-12-18 MED ORDER — ACETAMINOPHEN 325 MG PO TABS: 650.0000 mg | ORAL_TABLET | Freq: Four times a day (QID) | ORAL | Status: DC | PRN

## 2016-12-18 MED ORDER — HYDRALAZINE HCL 20 MG/ML IJ SOLN
10.0000 mg | Freq: Once | INTRAMUSCULAR | Status: AC
  Administered 2016-12-18: 10 mg via INTRAVENOUS
  Filled 2016-12-18: qty 1

## 2016-12-18 MED ORDER — ONDANSETRON HCL 4 MG PO TABS: 4.0000 mg | ORAL_TABLET | Freq: Four times a day (QID) | ORAL | Status: DC | PRN

## 2016-12-18 MED ORDER — LORATADINE 10 MG PO TABS
10.0000 mg | ORAL_TABLET | Freq: Every day | ORAL | Status: DC
  Administered 2016-12-18 – 2016-12-22 (×4): 10 mg via ORAL
  Filled 2016-12-18 (×5): qty 1

## 2016-12-18 MED ORDER — HEPARIN SODIUM (PORCINE) 5000 UNIT/ML IJ SOLN
5000.0000 [IU] | Freq: Three times a day (TID) | INTRAMUSCULAR | Status: DC
  Administered 2016-12-18 – 2016-12-22 (×11): 5000 [IU] via SUBCUTANEOUS
  Filled 2016-12-18 (×12): qty 1

## 2016-12-18 MED ORDER — ROPINIROLE HCL 1 MG PO TABS
0.5000 mg | ORAL_TABLET | Freq: Every day | ORAL | Status: DC
  Administered 2016-12-19 – 2016-12-20 (×2): 0.5 mg via ORAL
  Filled 2016-12-18 (×2): qty 1

## 2016-12-18 MED ORDER — ASPIRIN 81 MG PO TABS: 81.0000 mg | ORAL_TABLET | Freq: Two times a day (BID) | ORAL | Status: DC

## 2016-12-18 MED ORDER — PANTOPRAZOLE SODIUM 40 MG PO TBEC
80.0000 mg | DELAYED_RELEASE_TABLET | Freq: Every day | ORAL | Status: DC
  Administered 2016-12-18: 80 mg via ORAL
  Filled 2016-12-18 (×2): qty 2

## 2016-12-18 MED ORDER — LEVOTHYROXINE SODIUM 50 MCG PO TABS
75.0000 ug | ORAL_TABLET | Freq: Every day | ORAL | Status: DC
  Administered 2016-12-18 – 2016-12-22 (×4): 75 ug via ORAL
  Filled 2016-12-18 (×4): qty 1

## 2016-12-18 MED ORDER — NITROGLYCERIN 2 % TD OINT
1.0000 [in_us] | TOPICAL_OINTMENT | Freq: Once | TRANSDERMAL | Status: AC
  Administered 2016-12-18: 1 [in_us] via TOPICAL
  Filled 2016-12-18: qty 1

## 2016-12-18 MED ORDER — ONDANSETRON HCL 4 MG/2ML IJ SOLN: 4.0000 mg | Freq: Four times a day (QID) | INTRAMUSCULAR | Status: DC | PRN

## 2016-12-18 MED ORDER — FLUTICASONE PROPIONATE 50 MCG/ACT NA SUSP
1.0000 | Freq: Every day | NASAL | Status: DC
  Administered 2016-12-18 – 2016-12-22 (×4): 1 via NASAL
  Filled 2016-12-18: qty 16

## 2016-12-18 MED ORDER — PREDNISONE 50 MG PO TABS
50.0000 mg | ORAL_TABLET | ORAL | Status: AC
  Administered 2016-12-18: 50 mg via ORAL
  Filled 2016-12-18: qty 1

## 2016-12-18 MED ORDER — CLORAZEPATE DIPOTASSIUM 7.5 MG PO TABS: 7.5000 mg | ORAL_TABLET | Freq: Two times a day (BID) | ORAL | Status: DC | PRN

## 2016-12-18 MED ORDER — LEVOFLOXACIN IN D5W 750 MG/150ML IV SOLN
750.0000 mg | INTRAVENOUS | Status: DC
  Administered 2016-12-19: 750 mg via INTRAVENOUS
  Filled 2016-12-18: qty 150

## 2016-12-18 MED ORDER — ATORVASTATIN CALCIUM 20 MG PO TABS
20.0000 mg | ORAL_TABLET | Freq: Every day | ORAL | Status: DC
  Administered 2016-12-19 – 2016-12-21 (×3): 20 mg via ORAL
  Filled 2016-12-18 (×4): qty 1

## 2016-12-18 MED ORDER — DOCUSATE SODIUM 100 MG PO CAPS
100.0000 mg | ORAL_CAPSULE | Freq: Two times a day (BID) | ORAL | Status: DC
  Administered 2016-12-18 – 2016-12-22 (×7): 100 mg via ORAL
  Filled 2016-12-18 (×8): qty 1

## 2016-12-18 MED ORDER — LOSARTAN POTASSIUM 50 MG PO TABS
50.0000 mg | ORAL_TABLET | Freq: Every day | ORAL | Status: DC
  Administered 2016-12-18 – 2016-12-19 (×2): 50 mg via ORAL
  Filled 2016-12-18 (×2): qty 1

## 2016-12-18 MED ORDER — MAGNESIUM OXIDE 400 (241.3 MG) MG PO TABS
400.0000 mg | ORAL_TABLET | Freq: Two times a day (BID) | ORAL | Status: DC
Start: 2016-12-18 — End: 2016-12-22
  Administered 2016-12-18 – 2016-12-22 (×7): 400 mg via ORAL
  Filled 2016-12-18 (×8): qty 1

## 2016-12-18 MED ORDER — LABETALOL HCL 5 MG/ML IV SOLN
10.0000 mg | INTRAVENOUS | Status: DC | PRN
  Administered 2016-12-18 – 2016-12-21 (×6): 10 mg via INTRAVENOUS
  Filled 2016-12-18 (×7): qty 4

## 2016-12-18 MED ORDER — FAMOTIDINE 20 MG PO TABS
20.0000 mg | ORAL_TABLET | Freq: Two times a day (BID) | ORAL | Status: DC
  Administered 2016-12-18 – 2016-12-22 (×7): 20 mg via ORAL
  Filled 2016-12-18 (×8): qty 1

## 2016-12-18 MED ORDER — LEVOTHYROXINE SODIUM 50 MCG PO TABS: 50.0000 ug | ORAL_TABLET | Freq: Every day | ORAL | Status: DC

## 2016-12-18 MED ORDER — ACETAMINOPHEN 650 MG RE SUPP: 650.0000 mg | Freq: Four times a day (QID) | RECTAL | Status: DC | PRN

## 2016-12-18 NOTE — Progress Notes (Signed)
Called diet ian due to poor intake she is stopping by to see patient

## 2016-12-18 NOTE — Progress Notes (Signed)
Dr. Jannifer Franklin paged per pt's family request for MRI results.  Dr. Jannifer Franklin called pt's room, spoke w/ pt's family.

## 2016-12-18 NOTE — Progress Notes (Signed)
*  PRELIMINARY RESULTS* Echocardiogram 2D Echocardiogram has been performed.  Sherrie Sport 12/18/2016, 12:58 PM

## 2016-12-18 NOTE — Evaluation (Addendum)
Clinical/Bedside Swallow Evaluation Patient Details  Name: Janice Perkins MRN: [id number] Date of Birth: 01-03-1933  Today's Date: 12/18/2016 Time: SLP Start Time (ACUTE ONLY): 86 SLP Stop Time (ACUTE ONLY): 1020 SLP Time Calculation (min) (ACUTE ONLY): 60 min  Past Medical History:  Past Medical History:  Diagnosis Date  . Asthma   . Breast cancer ([place]) 2011  . Bronchitis 04/2015  . Cancer [facility]) 2011   breast  . COPD (chronic obstructive pulmonary disease) ([place])   . Hypertension   . Shingles    10/2015   Past Surgical History:  Past Surgical History:  Procedure Laterality Date  . BREAST EXCISIONAL BIOPSY Right 11/29/13   two areas  . BREAST EXCISIONAL BIOPSY Right 10/30/2009   lumpectomy - radiation   HPI:  Pt is a 83 y.o. female brought to the ED from home via EMS with a chief complaint of weakness, shortness of breath and altered mental status. Patient was seen in the ED several days ago for same; found to have bronchitis and discharged home on prednisone and doxycycline. Patient wears 2 L oxygen at night and states this evening she experienced coughing with shortness of breath and "dry mouth". She tells me that one of her blood pressure medicines was recently decreased. States she has been taking her medicines as directed. Denies fever, chills, chest pain, abdominal pain, nausea, vomiting, diarrhea. Denies recent travel or trauma. Exertion makes her symptoms worse. Pt denied any difficulty swallowing but stated she does get SOB easily w/ exertion. NSG indicated pt had not received her swallow screen in the ED; currently NPO. Pt is verbally conversive but is easily distracted and requires verbal cues for redirection and follow through intermittently - noted head CT results indicating moderate generalized atrophy and chronic appearing white matter hypodensities. Noted previous CXRs indicating Mild chronic bronchitic changes without acute infiltrate - pt stated she has "bronchiitis"  currently. Pt endorsed s/s of REFLUX and stated she takes Nexium at home, "I'm suppose to". Pt's speech was intelligible and appropriate; pt followed general instructions but was confused w/ husband's telephone number initially but remembered w/ cues. Pt is missing lower dentition which can impact articulation of speech as does dry mouth. Will continue to monitor need for ST f/u for cognitive-linguistic assessment and/or recommend f/u upon discharge if indicated.   Assessment / Plan / Recommendation Clinical Impression  Pt appears at reduced risk for aspiration when following general aspiration precautions including drinking thin liquids using single sips and more slowly taking rest breaks - monitor respiratory status and avoid becoming SOB w/ the exertion during oral intake. Pt consumed po trials w/ no immediate, overt s/s of aspiration noted; oral phase c/b min increased mastication time/effort w/ increased textured food trial - pt is not wearing her bottom denture plate(ill-fitting). Pt exhibited min belching and does have a baseline of GERD for which she takes Nexium per her report. Pt does appear at min increased risk for aspiration of REFLUX material which can be more chronic and easily impact pulmonary status. Recommend a more broken down diet consistency d/t lacking bottom dentures, exertion/effort of mastication w/ solids, and Esophageal dysmotility baseline. This was discussed w/ pt who agreed; also educated on taking rest breaks/pacing self and moistening foods well, mashing foods.  SLP Visit Diagnosis: Dysphagia, oral phase (R13.11);Dysphagia, pharyngoesophageal phase (R13.14)    Aspiration Risk   (reduced risk following precautions; mild risk - Esophagus)    Diet Recommendation   Dysphagia level 2(minced/cut) moistened well; Thin liquids; general  aspiration precautions; REFLUX precautions  Medication Administration: Whole meds with puree (or Crushed if needed for easiest swallowing)     Other  Recommendations Recommended Consults: Consider GI evaluation (Dietician f/u) Oral Care Recommendations: Oral care BID;Patient independent with oral care;Staff/trained caregiver to provide oral care   Follow up Recommendations None      Frequency and Duration min 2x/week  1 week       Prognosis Prognosis for Safe Diet Advancement: Fair (-Good) Barriers to Reach Goals:  (GERD baseline)      Swallow Study   General Date of Onset: 12/17/16 HPI: Pt is a 83 y.o. female brought to the ED from home via EMS with a chief complaint of weakness, shortness of breath and altered mental status. Patient was seen in the ED several days ago for same; found to have bronchitis and discharged home on prednisone and doxycycline. Patient wears 2 L oxygen at night and states this evening she experienced coughing with shortness of breath and "dry mouth". She tells me that one of her blood pressure medicines was recently decreased. States she has been taking her medicines as directed. Denies fever, chills, chest pain, abdominal pain, nausea, vomiting, diarrhea. Denies recent travel or trauma. Exertion makes her symptoms worse. Pt denied any difficulty swallowing but stated she does get SOB easily w/ exertion. NSG indicated pt had not received her swallow screen in the ED; currently NPO. Pt is verbally conversive but is easily distracted and requires verbal cues for redirection and follow through intermittently - noted head CT results indicating moderate generalized atrophy and chronic appearing white matter hypodensities. Noted previous CXRs indicating Mild chronic bronchitic changes without acute infiltrate - pt stated she has "bronchiitis" currently. Pt endorsed s/s of REFLUX and stated she takes Nexium at home, "I'm suppose to".  Type of Study: Bedside Swallow Evaluation Previous Swallow Assessment: none; although pt stated she has been seen for "my GERD" and takes Nexium per her report Diet Prior to this  Study: Thin liquids ("soft foods" at home) Temperature Spikes Noted: No (wbc 16) Respiratory Status: Nasal cannula (2 liters - baseline) History of Recent Intubation: No Behavior/Cognition: Alert;Cooperative;Pleasant mood;Confused;Requires cueing;Doesn't follow directions;Distractible Oral Cavity Assessment: Dry Oral Care Completed by SLP: Recent completion by staff Oral Cavity - Dentition: Dentures, top (does not wear the bottom dentures "much" - loose) Vision: Functional for self-feeding Self-Feeding Abilities: Able to feed self;Needs assist;Needs set up Patient Positioning: Upright in bed Baseline Vocal Quality: Normal (sounded winded w/ continued conversation) Volitional Cough: Strong;Congested (min) Volitional Swallow: Able to elicit    Oral/Motor/Sensory Function Overall Oral Motor/Sensory Function: Within functional limits   Ice Chips Ice chips: Within functional limits Presentation: Spoon (fed; 2 trials)   Thin Liquid Thin Liquid: Within functional limits Presentation: Cup;Self Fed;Straw (5-6 trials via each method) Other Comments: single sips recommended; rest breaks recommended    Nectar Thick Nectar Thick Liquid: Not tested   Honey Thick Honey Thick Liquid: Not tested   Puree Puree: Within functional limits Presentation: Self Fed;Spoon (~3 ozs)   Solid   GO   Solid: Impaired Presentation: Self Fed (mech soft trial x2) Oral Phase Impairments: Impaired mastication (increased time/effort) Pharyngeal Phase Impairments:  (none) Other Comments: moistened well w/ applesauce         Orinda Kenner, MS, CCC-SLP Zyquan Crotty 12/18/2016,12:30 PM

## 2016-12-18 NOTE — ED Notes (Signed)
PT to CT. Family at the bedside.

## 2016-12-18 NOTE — ED Notes (Signed)
respiratory therapist at the bedside to draw ABG. Pt tolerating well. Family at the bedside.

## 2016-12-18 NOTE — Evaluation (Signed)
Physical Therapy Evaluation Patient Details Name: Janice Perkins MRN: [id number] DOB: 26-Feb-1933 Today's Date: 12/18/2016   History of Present Illness  Pt is a 83 y.o. female brought to the ED from home via EMS with a chief complaint of weakness, shortness of breath and altered mental status. Patient was seen in the ED several days ago for same; found to have bronchitis and discharged home on prednisone and doxycycline. Patient wears 2 L oxygen at night and states this evening she experienced coughing with shortness of breath and "dry mouth".  She was admitted to [facility] and diagnosed with pneumonia.  She reports she had a recent back surgery in March of this year and went for rehab at discharge. She had been home for about 4 weeks and had a care aide a couple days a week.   Clinical Impression  Pt is a pleasant 83 year old female who was admitted for acute kidney injury. Pt performs bed mobility with mod assist and is unable to perform transfers/ambulation at this time secondary to balance/safety issues. Pt demonstrates deficits with strength/mobility/balance. Pt has been declining in strength since discharge from previous SNF stay. Currently not at baseline at this time. Would benefit from skilled PT to address above deficits and promote optimal return to PLOF; recommend transition to STR upon discharge from acute hospitalization.       Follow Up Recommendations SNF    Equipment Recommendations  None recommended by PT    Recommendations for Other Services       Precautions / Restrictions Precautions Precautions: Fall Restrictions Weight Bearing Restrictions: No      Mobility  Bed Mobility Overal bed mobility: Needs Assistance Bed Mobility: Supine to Sit     Supine to sit: Mod assist     General bed mobility comments: assist for sliding B LE off bed and trunk support. Pt has difficulty sitting at EOB and falls over towards R side. ONly able to sit for approx 2 minutes then needs  assist for returning back to bed. Needs +2 for sliding up towards HOB.  Transfers                 General transfer comment: unable at the time  Ambulation/Gait             General Gait Details: not safe to perform  Stairs            Wheelchair Mobility    Modified Rankin (Stroke Patients Only)       Balance Overall balance assessment: History of Falls;Needs assistance Sitting-balance support: Feet supported Sitting balance-Leahy Scale: Poor                                       Pertinent Vitals/Pain Pain Assessment: No/denies pain    Home Living Family/patient expects to be discharged to:: Private residence Living Arrangements: Spouse/significant other Available Help at Discharge: Family Type of Home: House Home Access: Level entry     Home Layout: One level Home Equipment: Environmental consultant - 2 wheels;Tub bench;Bedside commode;Grab bars - toilet;Grab bars - tub/shower Additional Comments: has 1 step inside home (just a few inches)    Prior Function Level of Independence: Needs assistance   Gait / Transfers Assistance Needed: ambulated with walker at home and short distances in the community.    ADL's / Homemaking Assistance Needed: Prior to back surgery in March she was independent with all  self care and light homemaking but has required increased assistance since March.    Comments: reports she is only able to walk a few feet prior to severe SOB symptoms with exertion.     Hand Dominance   Dominant Hand: Right    Extremity/Trunk Assessment   Upper Extremity Assessment Upper Extremity Assessment: Generalized weakness (B UE grossly 3+/5)    Lower Extremity Assessment Lower Extremity Assessment: Generalized weakness (B LE grossly 3/5)       Communication   Communication: No difficulties  Cognition Arousal/Alertness: Awake/alert Behavior During Therapy: WFL for tasks assessed/performed Overall Cognitive Status: Within Functional  Limits for tasks assessed                                 General Comments: gets distracted easily      General Comments General comments (skin integrity, edema, etc.): all mobility performed on 2L of O2 with sats WNL    Exercises Other Exercises Other Exercises: Reviewed supine ther-ex and pt able to perform 5 reps of B LE ankle pumps, SLRs, and hip abd/add. Wrote ther-ex on board and reviewed with patient. SHe becomes SOB with exertion.   Assessment/Plan    PT Assessment Patient needs continued PT services  PT Problem List Decreased strength;Decreased balance;Decreased mobility;Pain;Obesity       PT Treatment Interventions Gait training;DME instruction;Therapeutic exercise    PT Goals (Current goals can be found in the Care Plan section)  Acute Rehab PT Goals Patient Stated Goal: Patient wants to walk again and be able to do more for herself.  PT Goal Formulation: With patient Time For Goal Achievement: 01/01/17 Potential to Achieve Goals: Good    Frequency Min 2X/week   Barriers to discharge        Co-evaluation               AM-PAC PT "6 Clicks" Daily Activity  Outcome Measure Difficulty turning over in bed (including adjusting bedclothes, sheets and blankets)?: Total Difficulty moving from lying on back to sitting on the side of the bed? : Total Difficulty sitting down on and standing up from a chair with arms (e.g., wheelchair, bedside commode, etc,.)?: Total Help needed moving to and from a bed to chair (including a wheelchair)?: Total Help needed walking in hospital room?: Total Help needed climbing 3-5 steps with a railing? : Total 6 Click Score: 6    End of Session Equipment Utilized During Treatment: Oxygen Activity Tolerance: Patient limited by fatigue Patient left: in bed;with bed alarm set Nurse Communication: Mobility status PT Visit Diagnosis: Muscle weakness (generalized) (M62.81);History of falling (Z91.81);Difficulty in  walking, not elsewhere classified (R26.2)    Time: 4481-8563 PT Time Calculation (min) (ACUTE ONLY): 28 min   Charges:   PT Evaluation $PT Eval Moderate Complexity: 1 Procedure PT Treatments $Therapeutic Exercise: 8-22 mins   PT G Codes:        Greggory Stallion, PT, DPT [phone]   Janice Perkins 12/18/2016, 4:51 PM

## 2016-12-18 NOTE — Progress Notes (Signed)
[facility] NAME: Janice Perkins    MR#:  [id number]  DATE OF BIRTH:  1933/03/14  SUBJECTIVE:  CHIEF COMPLAINT:  Patient is altered. Husband at bedside, reporting chronic memory problems  REVIEW OF SYSTEMS:  Review of system unobtainable as the patient is altered from her baseline  DRUG ALLERGIES:   Allergies  Allergen Reactions  . Penicillins Hives  . Captopril Other (See Comments)  . Enalapril Maleate     Other reaction(s): Unknown  . Seldane  [Terfenadine] Other (See Comments)  . Tape Itching  . Augmentin [Amoxicillin-Pot Clavulanate] Rash  . Biaxin [Clarithromycin] Rash, Other (See Comments) and Hives    Other reaction(s): Unknown    VITALS:  Blood pressure (!) 159/82, pulse 72, temperature 98.4 F (36.9 C), temperature source Oral, resp. rate 16, height 5\' 1"  (1.549 m), weight 84.2 kg (185 lb 9.6 oz), SpO2 100 %.  PHYSICAL EXAMINATION:  GENERAL:  83 y.o.-year-old patient lying in the bed with no acute distress.  EYES: Pupils equal, round, reactive to light and accommodation. No scleral icterus.   HEENT: Head atraumatic, normocephalic. Oropharynx and nasopharynx clear.  NECK:  Supple, no jugular venous distention. No thyroid enlargement, no tenderness.  LUNGS: Mod breath sounds bilaterally, no wheezing, rales,rhonchi ; positive crepitation. No use of accessory muscles of respiration.  CARDIOVASCULAR: S1, S2 normal. No murmurs, rubs, or gallops.  ABDOMEN: Soft, nontender, nondistended. Bowel sounds present. No organomegaly or mass.  EXTREMITIES: No pedal edema, cyanosis, or clubbing.  NEUROLOGIC: Awake and alert and oriented 1-2  PSYCHIATRIC: The patient is alert and oriented x 1-2 SKIN: No obvious rash, lesion, or ulcer.    LABORATORY PANEL:   CBC  Recent Labs Lab 12/18/16 0047  WBC 16.0*  HGB 12.3  HCT 37.8  PLT 267    ------------------------------------------------------------------------------------------------------------------  Chemistries   Recent Labs Lab 12/18/16 0005  NA 136  K 4.6  CL 101  CO2 24  GLUCOSE 167*  BUN 25*  CREATININE 1.17*  CALCIUM 9.1  AST 54*  ALT 30  ALKPHOS 85  BILITOT 1.1   ------------------------------------------------------------------------------------------------------------------  Cardiac Enzymes  Recent Labs Lab 12/18/16 0005  TROPONINI 0.03*   ------------------------------------------------------------------------------------------------------------------  RADIOLOGY:  Ct Head Wo Contrast  Result Date: 12/18/2016 CLINICAL DATA:  Dyspnea tonight when getting up to use the restroom. Confusion and fatigue for the past week. Recently seen in the emergency department on April 30. EXAM: CT HEAD WITHOUT CONTRAST TECHNIQUE: Contiguous axial images were obtained from the base of the skull through the vertex without intravenous contrast. COMPARISON:  12/14/2016, 03/16/2013. FINDINGS: Brain: There is no intracranial hemorrhage, mass or evidence of acute infarction. There is moderate generalized atrophy. There is moderate chronic microvascular ischemic change. There is no significant extra-axial fluid collection. No acute intracranial findings are evident. Vascular: No hyperdense vessel or unexpected calcification. Skull: Normal. Negative for fracture or focal lesion. Sinuses/Orbits: No acute finding. Other: None. IMPRESSION: No acute intracranial findings. There is moderate generalized atrophy and chronic appearing white matter hypodensities which likely represent small vessel ischemic disease. Electronically Signed   By: Andreas Newport M.D.   On: 12/18/2016 01:00   Mr Brain Wo Contrast  Result Date: 12/18/2016 CLINICAL DATA:  Altered mental status since surgery 6 weeks ago. EXAM: MRI HEAD WITHOUT CONTRAST TECHNIQUE: Multiplanar, multiecho pulse sequences of the  brain and surrounding structures were obtained without intravenous contrast. COMPARISON:  Head CT earlier today.  Brain MRI 03/16/2013 FINDINGS: Brain: No acute  infarction, hemorrhage, hydrocephalus, extra-axial collection or mass lesion. Mild chronic microvascular ischemic change in the periventricular white matter. Generalized cerebral volume loss, commonly seen by this age but progressed from 2014. Presumed dilated perivascular spaces below the putamina. Vascular: Major flow voids are preserved Skull and upper cervical spine: New negative for marrow lesion Sinuses/Orbits: Mild patchy mucosal thickening in the paranasal sinuses. Status post endoscopic sinus surgery. Bilateral cataract resection. IMPRESSION: Senescent changes without acute finding. Electronically Signed   By: Monte Fantasia M.D.   On: 12/18/2016 12:28   US Carotid Bilateral (at [facility] Only)  Result Date: 12/18/2016 CLINICAL DATA:  83 year old female with a history of stroke. Cardiovascular risk factors include hypertension, known prior stroke/ TIA, hyperlipidemia EXAM: BILATERAL CAROTID DUPLEX ULTRASOUND TECHNIQUE: Pearline Cables scale imaging, color Doppler and duplex ultrasound were performed of bilateral carotid and vertebral arteries in the neck. COMPARISON:  No prior duplex FINDINGS: Criteria: Quantification of carotid stenosis is based on velocity parameters that correlate the residual internal carotid diameter with NASCET-based stenosis levels, using the diameter of the distal internal carotid lumen as the denominator for stenosis measurement. The following velocity measurements were obtained: RIGHT ICA:  Systolic 030 cm/sec, Diastolic 19 cm/sec CCA:  93 cm/sec SYSTOLIC ICA/CCA RATIO:  1.2 ECA:  156 cm/sec LEFT ICA:  Systolic 99 cm/sec, Diastolic 31 cm/sec CCA:  72 cm/sec SYSTOLIC ICA/CCA RATIO:  1.4 ECA:  156 cm/sec Right Brachial SBP: Not acquired Left Brachial SBP: Not acquired RIGHT CAROTID ARTERY: No significant calcifications of the  right common carotid artery. Intermediate waveform maintained. Heterogeneous and partially calcified plaque at the right carotid bifurcation. No significant lumen shadowing. Low resistance waveform of the right ICA. Mild tortuosity RIGHT VERTEBRAL ARTERY: Antegrade flow with low resistance waveform. LEFT CAROTID ARTERY: No significant calcifications of the left common carotid artery. Intermediate waveform maintained. Heterogeneous and partially calcified plaque at the left carotid bifurcation without significant lumen shadowing. Low resistance waveform of the left ICA. Mild tortuosity LEFT VERTEBRAL ARTERY:  Antegrade flow with low resistance waveform. IMPRESSION: Color duplex indicates minimal heterogeneous and calcified plaque, with no hemodynamically significant stenosis by duplex criteria in the extracranial cerebrovascular circulation. Signed, Dulcy Fanny. Earleen Newport, DO [facility] Electronically Signed   By: Corrie Mckusick D.O.   On: 12/18/2016 10:37   Dg Chest Port 1 View  Result Date: 12/18/2016 CLINICAL DATA:  Hypertensive.  Altered mental status. EXAM: PORTABLE CHEST 1 VIEW COMPARISON:  12/14/2016 FINDINGS: Patchy airspace opacity in the central right lung. Left lung is clear. Mild vascular prominence. Unchanged mild cardiomegaly. No large pleural effusion. IMPRESSION: Central right lung opacity may represent infectious infiltrate or asymmetric alveolar edema. Unchanged cardiomegaly. Electronically Signed   By: Andreas Newport M.D.   On: 12/18/2016 00:11    EKG:   Orders placed or performed during the hospital encounter of 12/17/16  . EKG 12-Lead  . EKG 12-Lead  . ED EKG  . ED EKG    ASSESSMENT AND PLAN:   #Encephalopathy with possible underlying dementia-secondary to pneumonia and acute kidney injury IV antibiotics and IV fluids. Monitor clinically  #. Hypertensive urgency: Blood pressure is slightly better. Continue Toprol-XL 50 mg  and increase to 75 mg Cozaar and titrate as needed   # Elevated troponin -non trending. Troponin less than 0.03, and 0.03   #. Pneumonia: Healthcare associated (the patient was recently discharged from skilled nursing facility for rehabilitation following back surgery); continue Levaquin,  vancomycin. Check MRSA PCR. If negative discontinue  vancomycin.  # Dysarthria: Slurred speech improved The patient also has dysdiadochokinesis.  CT head with no acute findings. MRI no acute changes. Dysarthria is assumed to be from  Metabolic issues .follow-up with neurology  Continue aspirin per atrial fibrillation   #Acute kidney injury and chronic kidney disease stage III : secondary to dehydration  continue IV fluids. Avoid nephrotoxic agents.Lactic acid elevation indicates dehydration in this case.  #. Hypothyroidism:  TSH 15.2, free T4 1.13 ,almost normal. Increased Synthroid to 75 g from home dose 50  # DVT prophylaxis: Heparin  #   GERD with regurgitation -GI prophylaxis: Pantoprazole per home regimen, Pepcid added      All the records are reviewed and case discussed with Care Management/Social Workerr. Management plans discussed with the patient, family and they are in agreement.  CODE STATUS: FC  TOTAL TIME TAKING CARE OF THIS PATIENT: 45  minutes.   POSSIBLE D/C IN 2 DAYS, DEPENDING ON CLINICAL CONDITION.  Note: This dictation was prepared with Dragon dictation along with smaller phrase technology. Any transcriptional errors that result from this process are unintentional.   Nicholes Mango M.D on 12/18/2016 at 3:20 PM  Between 7am to 6pm - Pager - [phone] After 6pm go to [email or web address] - password [email or web address] [facility]  Office  [phone]  CC: Primary care physician; [name], MD

## 2016-12-18 NOTE — Evaluation (Signed)
Occupational Therapy Evaluation Patient Details Name: Janice Perkins MRN: [id number] DOB: 04-19-1933 Today's Date: 12/18/2016    History of Present Illness Pt is a 83 y.o. female brought to the ED from home via EMS with a chief complaint of weakness, shortness of breath and altered mental status. Patient was seen in the ED several days ago for same; found to have bronchitis and discharged home on prednisone and doxycycline. Patient wears 2 L oxygen at night and states this evening she experienced coughing with shortness of breath and "dry mouth".  She was admitted to [facility] and diagnosed with pneumonia.  She reports she had a recent back surgery in March of this year and went for rehab at discharge. She had been home for about 4 weeks and had a care aide a couple days a week.    Clinical Impression   Patient seen for OT evaluation this date.  She lives in a one story home with her husband and a daughter who has cancer.   She presents with muscle weakness, decreased transfers, functional mobility skills and decreased ability to perform self care tasks.  She would benefit from skilled OT to maximize her safety and independence in daily activities. She will likely require short term rehab prior to returning home.    Follow Up Recommendations  SNF    Equipment Recommendations       Recommendations for Other Services       Precautions / Restrictions Precautions Precautions: Fall Restrictions Weight Bearing Restrictions: No      Mobility Bed Mobility Overal bed mobility: Needs Assistance                Transfers                 General transfer comment: Refer to PT evaluation this date for transfer status.    Balance                                           ADL either performed or assessed with clinical judgement   ADL   Eating/Feeding: Modified independent   Grooming: Bed level;Set up   Upper Body Bathing: Set up;Minimal assistance   Lower Body  Bathing: Set up;Maximal assistance   Upper Body Dressing : Set up;Minimal assistance   Lower Body Dressing: Set up;Maximal assistance     Toilet Transfer Details (indicate cue type and reason): Did not perform this date, patient declined secondary to fatigue, will try again next date.           General ADL Comments: Patient reports she tried to get up earlier and is now fatigued and declined out of bed transfers in the pm, will continue to assess     Vision         Perception     Praxis      Pertinent Vitals/Pain Pain Assessment: No/denies pain     Hand Dominance Right   Extremity/Trunk Assessment Upper Extremity Assessment Upper Extremity Assessment: Generalized weakness   Lower Extremity Assessment Lower Extremity Assessment: Defer to PT evaluation       Communication Communication Communication: No difficulties   Cognition Arousal/Alertness: Awake/alert Behavior During Therapy: WFL for tasks assessed/performed Overall Cognitive Status: Within Functional Limits for tasks assessed  General Comments       Exercises     Shoulder Instructions      Home Living Family/patient expects to be discharged to:: Private residence Living Arrangements: Spouse/significant other Available Help at Discharge: Family Type of Home: House       Home Layout: One level     Bathroom Shower/Tub: Tub/shower unit;Curtain   Biochemist, clinical: Handicapped height Bathroom Accessibility: Yes How Accessible: Accessible via walker Home Equipment: [place] - 2 wheels;Tub bench;Bedside commode;Grab bars - toilet;Grab bars - tub/shower          Prior Functioning/Environment Level of Independence: Needs assistance  Gait / Transfers Assistance Needed: ambulated with walker at home and short distances in the community.   ADL's / Homemaking Assistance Needed: Prior to back surgery in March she was independent with all self care and  light homemaking but has required increased assistance since March.              OT Problem List: Decreased strength;Decreased activity tolerance;Decreased knowledge of use of DME or AE      OT Treatment/Interventions: Self-care/ADL training;DME and/or AE instruction;Therapeutic activities;Therapeutic exercise;Patient/family education    OT Goals(Current goals can be found in the care plan section) Acute Rehab OT Goals Patient Stated Goal: Patient wants to walk again and be able to do more for herself.  OT Goal Formulation: With patient Time For Goal Achievement: 12/26/16 Potential to Achieve Goals: Good  OT Frequency: Min 1X/week   Barriers to D/C:            Co-evaluation              AM-PAC PT "6 Clicks" Daily Activity     Outcome Measure Help from another person eating meals?: None Help from another person taking care of personal grooming?: A Little Help from another person toileting, which includes using toliet, bedpan, or urinal?: A Lot Help from another person bathing (including washing, rinsing, drying)?: A Lot Help from another person to put on and taking off regular upper body clothing?: A Little Help from another person to put on and taking off regular lower body clothing?: A Lot 6 Click Score: 16   End of Session Equipment Utilized During Treatment: Oxygen  Activity Tolerance: Patient limited by fatigue Patient left: in bed;with call bell/phone within reach;with bed alarm set  OT Visit Diagnosis: Muscle weakness (generalized) (M62.81)                Time: 2248-2500 OT Time Calculation (min): 26 min Charges:  OT General Charges $OT Visit: 1 Procedure OT Evaluation $OT Eval Low Complexity: 1 Procedure OT Treatments $Self Care/Home Management : 8-22 mins G-Codes:     Shayanna Thatch T Damian Buckles, OTR/L, CLT   Tanessa Tidd 12/18/2016, 3:26 PM

## 2016-12-18 NOTE — Plan of Care (Signed)
Problem: SLP Dysphagia Goals Goal: Misc Dysphagia Goal Pt will safely tolerate po diet of least restrictive consistency w/ no overt s/s of aspiration noted by Staff/pt/family x3 sessions.    

## 2016-12-18 NOTE — Progress Notes (Signed)
Pharmacy Antibiotic Note  Janice Perkins is a 83 y.o. female admitted on 12/17/2016 with CAP.  Pharmacy has been consulted for levofloxacin dosing.  Plan: Levofloxacin 750 mg IV Q48H  Height: 5\' 1"  (154.9 cm) Weight: 185 lb 9.6 oz (84.2 kg) IBW/kg (Calculated) : 47.8  Temp (24hrs), Avg:98.2 F (36.8 C), Min:98.2 F (36.8 C), Max:98.2 F (36.8 C)   Recent Labs Lab 12/14/16 1520 12/18/16 0005 12/18/16 0047 12/18/16 0048 12/18/16 0348  WBC 8.7  --  16.0*  --   --   CREATININE 1.19* 1.17*  --   --   --   LATICACIDVEN  --   --   --  2.6* 1.7    Estimated Creatinine Clearance: 36.5 mL/min (A) (by C-G formula based on SCr of 1.17 mg/dL (H)).    Allergies  Allergen Reactions  . Penicillins Hives  . Captopril Other (See Comments)  . Enalapril Maleate     Other reaction(s): Unknown  . Seldane  [Terfenadine] Other (See Comments)  . Tape Itching  . Augmentin [Amoxicillin-Pot Clavulanate] Rash  . Biaxin [Clarithromycin] Rash, Other (See Comments) and Hives    Other reaction(s): Unknown    Thank you for allowing pharmacy to be a part of this patient's care.  Laural Benes, Pharm.D., BCPS Clinical Pharmacist 12/18/2016 8:06 AM

## 2016-12-18 NOTE — Consult Note (Signed)
Reason for Consult:Altered mental status Referring Physician: Gouru  CC: Altered mental status  HPI: Janice Perkins is an 83 y.o. female who had surgery about 6 weeks ago.  Has been altered since that time.  ON Monday was worse.  Was sent to the ED and work up was unremarkable but BP was elevated.  Was scheduled for a MRI of the brain today.  On last night became confused, combative and developed slurred speech.  EMS was called.  Patient has gradually improved since admission.    Past Medical History:  Diagnosis Date  . Asthma   . Breast cancer ([place]) 2011  . Bronchitis 04/2015  . Cancer [facility]) 2011   breast  . COPD (chronic obstructive pulmonary disease) ([place])   . Hypertension   . Shingles    10/2015    Past Surgical History:  Procedure Laterality Date  . BREAST EXCISIONAL BIOPSY Right 11/29/13   two areas  . BREAST EXCISIONAL BIOPSY Right 10/30/2009   lumpectomy - radiation    Family History  Problem Relation Age of Onset  . Hypertension Mother   . Heart disease Mother   . Cancer Mother   . Stroke Father   . Hypertension Father   . Breast cancer Sister     Social History:  reports that she has never smoked. She has never used smokeless tobacco. She reports that she does not drink alcohol or use drugs.  Allergies  Allergen Reactions  . Penicillins Hives  . Captopril Other (See Comments)  . Enalapril Maleate     Other reaction(s): Unknown  . Seldane  [Terfenadine] Other (See Comments)  . Tape Itching  . Augmentin [Amoxicillin-Pot Clavulanate] Rash  . Biaxin [Clarithromycin] Rash, Other (See Comments) and Hives    Other reaction(s): Unknown    Medications:  I have reviewed the patient's current medications. Prior to Admission:  Facility-Administered Medications Prior to Admission  Medication Dose Route Frequency Provider Last Rate Last Dose  . bupivacaine (PF) (MARCAINE) 0.25 % injection 30 mL  30 mL Other Once Mohammed Kindle, MD      . bupivacaine (PF) (MARCAINE)  0.25 % injection 30 mL  30 mL Other Once Mohammed Kindle, MD      . ciprofloxacin (CIPRO) IVPB 400 mg  400 mg Intravenous Once Mohammed Kindle, MD      . fentaNYL (SUBLIMAZE) injection 100 mcg  100 mcg Intravenous Once Mohammed Kindle, MD      . lactated ringers infusion 1,000 mL  1,000 mL Intravenous Continuous Mohammed Kindle, MD      . lactated ringers infusion 1,000 mL  1,000 mL Intravenous Continuous Mohammed Kindle, MD      . lactated ringers infusion 1,000 mL  1,000 mL Intravenous Continuous Mohammed Kindle, MD      . lactated ringers infusion 1,000 mL  1,000 mL Intravenous Continuous Mohammed Kindle, MD      . lactated ringers infusion 1,000 mL  1,000 mL Intravenous Continuous Mohammed Kindle, MD 125 mL/hr at 02/03/16 1058 1,000 mL at 02/03/16 1058  . lactated ringers infusion 1,000 mL  1,000 mL Intravenous Continuous Mohammed Kindle, MD      . lidocaine (PF) (XYLOCAINE) 1 % injection 10 mL  10 mL Subcutaneous Once Mohammed Kindle, MD      . midazolam (VERSED) 5 MG/5ML injection 5 mg  5 mg Intravenous Once Mohammed Kindle, MD      . midazolam (VERSED) 5 MG/5ML injection 5 mg  5 mg Intravenous Once Mohammed Kindle, MD      .  orphenadrine (NORFLEX) injection 60 mg  60 mg Intramuscular Once Mohammed Kindle, MD      . orphenadrine (NORFLEX) injection 60 mg  60 mg Intramuscular Once Mohammed Kindle, MD      . orphenadrine (NORFLEX) injection 60 mg  60 mg Intramuscular Once Mohammed Kindle, MD      . sodium chloride 0.9 % injection 20 mL  20 mL Other Once Mohammed Kindle, MD      . sodium chloride 0.9 % injection 20 mL  20 mL Other Once Mohammed Kindle, MD      . sodium chloride flush (NS) 0.9 % injection 20 mL  20 mL Other Once Mohammed Kindle, MD      . triamcinolone acetonide (KENALOG-40) injection 40 mg  40 mg Other Once Mohammed Kindle, MD      . triamcinolone acetonide (KENALOG-40) injection 40 mg  40 mg Other Once Mohammed Kindle, MD       Prescriptions Prior to Admission  Medication Sig Dispense Refill Last Dose  .  alendronate (FOSAMAX) 70 MG tablet Take 70 mg by mouth once a week. Take with a full glass of water on an empty stomach.     Marland Kitchen aspirin 81 MG tablet Take 81 mg by mouth 2 (two) times daily.    Taking  . atorvastatin (LIPITOR) 20 MG tablet Take 20 mg by mouth daily.   Taking  . cholecalciferol (VITAMIN D) 1000 units tablet Take 1,000 Units by mouth daily.   12/17/2016 at Unknown time  . clorazepate (TRANXENE) 7.5 MG tablet Take 7.5 mg by mouth 2 (two) times daily as needed for anxiety.     Marland Kitchen doxycycline (VIBRAMYCIN) 50 MG capsule Take 2 capsules (100 mg total) by mouth 2 (two) times daily. 28 capsule 0 12/17/2016 at Unknown time  . esomeprazole (NEXIUM) 40 MG capsule Take 40 mg by mouth daily at 12 noon.   Taking  . fexofenadine (ALLEGRA) 180 MG tablet Take 180 mg by mouth daily.   Taking  . FLUoxetine (PROZAC) 20 MG capsule Take 20 mg by mouth 2 (two) times daily.   12/17/2016 at Unknown time  . fluticasone (FLONASE) 50 MCG/ACT nasal spray Place into both nostrils daily.   Taking  . Fluticasone-Salmeterol (ADVAIR) 100-50 MCG/DOSE AEPB Inhale 1 puff into the lungs 2 (two) times daily.   12/17/2016 at Unknown time  . ipratropium (ATROVENT) 0.02 % nebulizer solution Take 0.5 mg by nebulization 4 (four) times daily as needed.    Taking  . levothyroxine (SYNTHROID, LEVOTHROID) 50 MCG tablet Take 50 mcg by mouth daily before breakfast.   12/17/2016 at Unknown time  . losartan (COZAAR) 50 MG tablet Take 50 mg by mouth daily.     . magnesium oxide (MAG-OX) 400 MG tablet Take 400 mg by mouth 2 (two) times daily.     . metoprolol succinate (TOPROL-XL) 50 MG 24 hr tablet Take 50 mg by mouth 2 (two) times daily. Take with or immediately following a meal.   12/17/2016 at Unknown time  . predniSONE (DELTASONE) 10 MG tablet Take 5 tablets (50 mg total) by mouth daily. 20 tablet 0 12/17/2016 at Unknown time  . rOPINIRole (REQUIP) 0.5 MG tablet Take 0.5 mg by mouth at bedtime.     Marland Kitchen HYDROcodone-acetaminophen (NORCO/VICODIN) 5-325  MG tablet Limit 1 tab by mouth 2-3 times per day if tolerated 80 tablet 0 Taking  . meloxicam (MOBIC) 15 MG tablet Take 15 mg by mouth as needed for pain.   Taking  Scheduled: . aspirin  300 mg Rectal Daily   Or  . aspirin  325 mg Oral Daily  . atorvastatin  20 mg Oral q1800  . cholecalciferol  1,000 Units Oral Daily  . docusate sodium  100 mg Oral BID  . famotidine  20 mg Oral BID  . FLUoxetine  20 mg Oral BID  . fluticasone  1 spray Each Nare Daily  . heparin  5,000 Units Subcutaneous Q8H  . levothyroxine  75 mcg Oral QAC breakfast  . loratadine  10 mg Oral Daily  . losartan  50 mg Oral Daily  . magnesium oxide  400 mg Oral BID  . metoprolol succinate  50 mg Oral BID WC  . mometasone-formoterol  2 puff Inhalation BID  . pantoprazole  80 mg Oral Q1200  . predniSONE  50 mg Oral BH-q7a  . rOPINIRole  0.5 mg Oral QHS    ROS: History obtained from the patient  General ROS: negative for - chills, fatigue, fever, night sweats, weight gain or weight loss Psychological ROS: negative for - behavioral disorder, hallucinations, memory difficulties, mood swings or suicidal ideation Ophthalmic ROS: negative for - blurry vision, double vision, eye pain or loss of vision ENT ROS: negative for - epistaxis, nasal discharge, oral lesions, sore throat, tinnitus or vertigo Allergy and Immunology ROS: negative for - hives or itchy/watery eyes Hematological and Lymphatic ROS: negative for - bleeding problems, bruising or swollen lymph nodes Endocrine ROS: negative for - galactorrhea, hair pattern changes, polydipsia/polyuria or temperature intolerance Respiratory ROS: wheezing, SOB Cardiovascular ROS: diaphoresis Gastrointestinal ROS: negative for - abdominal pain, diarrhea, hematemesis, nausea/vomiting or stool incontinence Genito-Urinary ROS: negative for - dysuria, hematuria, incontinence or urinary frequency/urgency Musculoskeletal ROS: negative for - joint swelling or muscular  weakness Neurological ROS: as noted in HPI Dermatological ROS: negative for rash and skin lesion changes  Physical Examination: Blood pressure (!) 177/84, pulse 78, temperature 98.3 F (36.8 C), temperature source Oral, resp. rate 16, height 5\' 1"  (1.549 m), weight 84.2 kg (185 lb 9.6 oz), SpO2 100 %.  HEENT-  Normocephalic, no lesions, without obvious abnormality.  Normal external eye and conjunctiva.  Normal TM's bilaterally.  Normal auditory canals and external ears. Normal external nose, mucus membranes and septum.  Normal pharynx. Cardiovascular- S1, S2 normal, pulses palpable throughout   Lungs- wheezes heard throughout Abdomen- soft, non-tender; bowel sounds normal; no masses,  no organomegaly Extremities- no edema Lymph-no adenopathy palpable Musculoskeletal-no joint tenderness, deformity or swelling Skin-warm and dry, no hyperpigmentation, vitiligo, or suspicious lesions  Neurological Examination   Mental Status: Alert, oriented, thought content appropriate.  Speech fluent without evidence of aphasia.  Able to follow 3 step commands without difficulty. Cranial Nerves: II: Discs flat bilaterally; Visual fields grossly normal, pupils equal, round, reactive to light and accommodation III,IV, VI: ptosis not present, extra-ocular motions intact bilaterally V,VII: smile symmetric, facial light touch sensation normal bilaterally VIII: hearing normal bilaterally IX,X: gag reflex present XI: bilateral shoulder shrug XII: midline tongue extension Motor: Right : Upper extremity   5/5    Left:     Upper extremity   5/5  Lower extremity   5/5     Lower extremity   5/5 Tone and bulk:normal tone throughout; no atrophy noted Sensory: Pinprick and light touch intact throughout, bilaterally Deep Tendon Reflexes: 2+ and symmetric with absent AJ's bilaterally Plantars: Right: downgoing   Left: downgoing Cerebellar: Normal finger-to-nose and normal heel-to-shin testing bilaterally Gait: not  tested due to safety concerns  Laboratory Studies:   Basic Metabolic Panel:  Recent Labs Lab 12/14/16 1520 12/18/16 0005  NA 136 136  K 4.0 4.6  CL 101 101  CO2 25 24  GLUCOSE 101* 167*  BUN 18 25*  CREATININE 1.19* 1.17*  CALCIUM 9.1 9.1    Liver Function Tests:  Recent Labs Lab 12/14/16 1520 12/18/16 0005  AST 37 54*  ALT 19 30  ALKPHOS 87 85  BILITOT 1.6* 1.1  PROT 7.7 8.0  ALBUMIN 4.1 4.2   No results for input(s): LIPASE, AMYLASE in the last 168 hours. No results for input(s): AMMONIA in the last 168 hours.  CBC:  Recent Labs Lab 12/14/16 1520 12/18/16 0047  WBC 8.7 16.0*  NEUTROABS  --  12.5*  HGB 12.2 12.3  HCT 37.1 37.8  MCV 88.5 86.4  PLT 186 267    Cardiac Enzymes:  Recent Labs Lab 12/18/16 0005  TROPONINI 0.03*    BNP: Invalid input(s): POCBNP  CBG: No results for input(s): GLUCAP in the last 168 hours.  Microbiology: Results for orders placed or performed in visit on 05/20/14  Eye culture     Status: None   Collection Time: 05/20/14 10:55 AM  Result Value Ref Range Status   Micro Text Report   Final       SOURCE: L EYE AQUEOUS HUMOR    COMMENT                   NO GROWTH AEROBICALLY/ANAEROBICALLY IN 4 DAYS   GRAM STAIN                RARE WHITE BLOOD CELLS   GRAM STAIN                NO ORGANISMS SEEN   ANTIBIOTIC                                                        Coagulation Studies: No results for input(s): LABPROT, INR in the last 72 hours.  Urinalysis:  Recent Labs Lab 12/14/16 1619 12/18/16 0047  COLORURINE AMBER* STRAW*  LABSPEC 1.021 1.006  PHURINE 5.0 7.0  GLUCOSEU NEGATIVE NEGATIVE  HGBUR NEGATIVE SMALL*  BILIRUBINUR NEGATIVE NEGATIVE  KETONESUR NEGATIVE NEGATIVE  PROTEINUR 30* 100*  NITRITE NEGATIVE NEGATIVE  LEUKOCYTESUR NEGATIVE NEGATIVE    Lipid Panel:     Component Value Date/Time   CHOL 120 12/18/2016 0005   TRIG 81 12/18/2016 0005   HDL 30 (L) 12/18/2016 0005   CHOLHDL 4.0  12/18/2016 0005   VLDL 16 12/18/2016 0005   LDLCALC 74 12/18/2016 0005    HgbA1C: No results found for: HGBA1C  Urine Drug Screen:  No results found for: LABOPIA, COCAINSCRNUR, LABBENZ, AMPHETMU, THCU, LABBARB  Alcohol Level: No results for input(s): ETH in the last 168 hours.  Other results: EKG: atrial fibrillation, rate 79 bpm.  Imaging: Ct Head Wo Contrast  Result Date: 12/18/2016 CLINICAL DATA:  Dyspnea tonight when getting up to use the restroom. Confusion and fatigue for the past week. Recently seen in the emergency department on April 30. EXAM: CT HEAD WITHOUT CONTRAST TECHNIQUE: Contiguous axial images were obtained from the base of the skull through the vertex without intravenous contrast. COMPARISON:  12/14/2016, 03/16/2013. FINDINGS: Brain: There is no intracranial hemorrhage, mass or evidence of acute infarction. There is  moderate generalized atrophy. There is moderate chronic microvascular ischemic change. There is no significant extra-axial fluid collection. No acute intracranial findings are evident. Vascular: No hyperdense vessel or unexpected calcification. Skull: Normal. Negative for fracture or focal lesion. Sinuses/Orbits: No acute finding. Other: None. IMPRESSION: No acute intracranial findings. There is moderate generalized atrophy and chronic appearing white matter hypodensities which likely represent small vessel ischemic disease. Electronically Signed   By: Andreas Newport M.D.   On: 12/18/2016 01:00   US Carotid Bilateral (at [facility] Only)  Result Date: 12/18/2016 CLINICAL DATA:  83 year old female with a history of stroke. Cardiovascular risk factors include hypertension, known prior stroke/ TIA, hyperlipidemia EXAM: BILATERAL CAROTID DUPLEX ULTRASOUND TECHNIQUE: Pearline Cables scale imaging, color Doppler and duplex ultrasound were performed of bilateral carotid and vertebral arteries in the neck. COMPARISON:  No prior duplex FINDINGS: Criteria: Quantification of carotid  stenosis is based on velocity parameters that correlate the residual internal carotid diameter with NASCET-based stenosis levels, using the diameter of the distal internal carotid lumen as the denominator for stenosis measurement. The following velocity measurements were obtained: RIGHT ICA:  Systolic 468 cm/sec, Diastolic 19 cm/sec CCA:  93 cm/sec SYSTOLIC ICA/CCA RATIO:  1.2 ECA:  156 cm/sec LEFT ICA:  Systolic 99 cm/sec, Diastolic 31 cm/sec CCA:  72 cm/sec SYSTOLIC ICA/CCA RATIO:  1.4 ECA:  156 cm/sec Right Brachial SBP: Not acquired Left Brachial SBP: Not acquired RIGHT CAROTID ARTERY: No significant calcifications of the right common carotid artery. Intermediate waveform maintained. Heterogeneous and partially calcified plaque at the right carotid bifurcation. No significant lumen shadowing. Low resistance waveform of the right ICA. Mild tortuosity RIGHT VERTEBRAL ARTERY: Antegrade flow with low resistance waveform. LEFT CAROTID ARTERY: No significant calcifications of the left common carotid artery. Intermediate waveform maintained. Heterogeneous and partially calcified plaque at the left carotid bifurcation without significant lumen shadowing. Low resistance waveform of the left ICA. Mild tortuosity LEFT VERTEBRAL ARTERY:  Antegrade flow with low resistance waveform. IMPRESSION: Color duplex indicates minimal heterogeneous and calcified plaque, with no hemodynamically significant stenosis by duplex criteria in the extracranial cerebrovascular circulation. Signed, Dulcy Fanny. Earleen Newport, DO [facility] Radiology Electronically Signed   By: Corrie Mckusick D.O.   On: 12/18/2016 10:37   Dg Chest Port 1 View  Result Date: 12/18/2016 CLINICAL DATA:  Hypertensive.  Altered mental status. EXAM: PORTABLE CHEST 1 VIEW COMPARISON:  12/14/2016 FINDINGS: Patchy airspace opacity in the central right lung. Left lung is clear. Mild vascular prominence. Unchanged mild cardiomegaly.  No large pleural effusion. IMPRESSION: Central right lung opacity may represent infectious infiltrate or asymmetric alveolar edema. Unchanged cardiomegaly. Electronically Signed   By: Andreas Newport M.D.   On: 12/18/2016 00:11     Assessment/Plan: 83 year old female presenting with altered mental status that has acutely worsened.  Patient now improved from presentation.  Blood work reveals an elevated white blood cell count, chest x-ray shows PNA, TSH elevated and renal insufficiency noted.  I suspect all of these abnormalities have contributed to recent worsening of mental status.  Patient currently in the process of work up for prolonged issues.  Head CT reviewed and shows no acute changes.    Recommendations: 1.  Agree with addressing metabolic issues 2.  MRI of the brain without contrast 3.  ASA 325mg  daily for atrial fibrillation  Alexis Goodell, MD Neurology [phone] 12/18/2016, 11:16 AM

## 2016-12-18 NOTE — Clinical Social Work Note (Signed)
CSW consulted for New SNF. PT eval is pending. OT is recommending SNF. Full assessment to follow. CSW will continue to follow.   Darden Dates, MSW, LCSW  Clinical Social Worker  [phone]

## 2016-12-18 NOTE — Care Management Note (Signed)
Case Management Note  Patient Details  Name: Janice Perkins MRN: [id number] Date of Birth: March 31, 1933  Subjective/Objective:                  Met with patient and her husband to discuss discharge planning. She had back surgery (per husband) 6 weeks ago and then went to [facility] for rehab. She presents [facility]. She states that she has no pain however "developed pneumonia and Dr. Ginette Pitman sent her to the hospital".  She has chronic home O2 through [place]. She has a walker to ambulate with but apparently patient hasn't been moving a whole lot after surgery. She would like to go back to [facility]. PT and SLP pending.  Action/Plan: CSW updated. Patient is open to [facility] for OT and PT. Home health had requested home health RN to be added so if she returns home I would request a nurse and social worker for the home. RNCM will continue to follow.   Expected Discharge Date:                  Expected Discharge Plan:     In-House Referral:  Clinical Social Work  Discharge planning Services  CM Consult  Post Acute Care Choice:  Home Health Choice offered to:  Patient, Spouse  DME Arranged:    DME Agency:     HH Arranged:  PT, RN, OT, Nurse's Aide, Social Work [employer] Agency:  [place]  Status of Service:  In process, will continue to follow  If discussed at Long Length of Stay Meetings, dates discussed:    Additional Comments:  Marshell Garfinkel, RN 12/18/2016, 3:13 PM

## 2016-12-18 NOTE — ED Notes (Signed)
Assisted pt with bedpan. Pt unable to void. Provided for pt safety and comfort and will continue to assess.

## 2016-12-18 NOTE — H&P (Signed)
Janice Perkins is an 83 y.o. female.   Chief Complaint: Shortness of breath HPI: The patient with past medical history of hypertension and COPD presents to the emergency department complaining of shortness of breath. The patient has seen her primary care physician as well as undergone ED evaluation in the last 5 days for cough and shortness of breath. The cough has been nonproductive. Workup was unremarkable in the emergency department and the patient was diagnosed with chronic bronchitis. Earlier in his course of illness the patient was taken to her primary care doctor for the same but also to be evaluated for slurred speech and memory loss both of these findings were acute at the time. Her PCP ordered an MRI which is scheduled for today. During her last ED visit CT of her head showed no indication of stroke but her blood pressure was very elevated at that time. He continues to be elevated today. The patient was given multiple doses of hydralazine in the emergency department as well as as nitroglycerin paste. Imaging of her chest today also revealed pneumonia. Antibiotics were initiated in the emergency department prior to the hospitalist service being called for admission.  Past Medical History:  Diagnosis Date  . Asthma   . Breast cancer ([place]) 2011  . Bronchitis 04/2015  . Cancer [facility]) 2011   breast  . COPD (chronic obstructive pulmonary disease) ([place])   . Hypertension   . Shingles    10/2015    Past Surgical History:  Procedure Laterality Date  . BREAST EXCISIONAL BIOPSY Right 11/29/13   two areas  . BREAST EXCISIONAL BIOPSY Right 10/30/2009   lumpectomy - radiation    Family History  Problem Relation Age of Onset  . Hypertension Mother   . Heart disease Mother   . Cancer Mother   . Stroke Father   . Hypertension Father   . Breast cancer Sister    Social History:  reports that she has never smoked. She has never used smokeless tobacco. She reports that she does not drink alcohol or  use drugs.  Allergies:  Allergies  Allergen Reactions  . Penicillins Hives  . Captopril Other (See Comments)  . Enalapril Maleate     Other reaction(s): Unknown  . Seldane  [Terfenadine] Other (See Comments)  . Tape Itching  . Augmentin [Amoxicillin-Pot Clavulanate] Rash  . Biaxin [Clarithromycin] Rash, Other (See Comments) and Hives    Other reaction(s): Unknown    Facility-Administered Medications Prior to Admission  Medication Dose Route Frequency Provider Last Rate Last Dose  . bupivacaine (PF) (MARCAINE) 0.25 % injection 30 mL  30 mL Other Once Mohammed Kindle, MD      . bupivacaine (PF) (MARCAINE) 0.25 % injection 30 mL  30 mL Other Once Mohammed Kindle, MD      . ciprofloxacin (CIPRO) IVPB 400 mg  400 mg Intravenous Once Mohammed Kindle, MD      . fentaNYL (SUBLIMAZE) injection 100 mcg  100 mcg Intravenous Once Mohammed Kindle, MD      . lactated ringers infusion 1,000 mL  1,000 mL Intravenous Continuous Mohammed Kindle, MD      . lactated ringers infusion 1,000 mL  1,000 mL Intravenous Continuous Mohammed Kindle, MD      . lactated ringers infusion 1,000 mL  1,000 mL Intravenous Continuous Mohammed Kindle, MD      . lactated ringers infusion 1,000 mL  1,000 mL Intravenous Continuous Mohammed Kindle, MD      . lactated ringers infusion 1,000 mL  1,000 mL Intravenous Continuous Mohammed Kindle, MD 125 mL/hr at 02/03/16 1058 1,000 mL at 02/03/16 1058  . lactated ringers infusion 1,000 mL  1,000 mL Intravenous Continuous Mohammed Kindle, MD      . lidocaine (PF) (XYLOCAINE) 1 % injection 10 mL  10 mL Subcutaneous Once Mohammed Kindle, MD      . midazolam (VERSED) 5 MG/5ML injection 5 mg  5 mg Intravenous Once Mohammed Kindle, MD      . midazolam (VERSED) 5 MG/5ML injection 5 mg  5 mg Intravenous Once Mohammed Kindle, MD      . orphenadrine (NORFLEX) injection 60 mg  60 mg Intramuscular Once Mohammed Kindle, MD      . orphenadrine (NORFLEX) injection 60 mg  60 mg Intramuscular Once Mohammed Kindle, MD      .  orphenadrine (NORFLEX) injection 60 mg  60 mg Intramuscular Once Mohammed Kindle, MD      . sodium chloride 0.9 % injection 20 mL  20 mL Other Once Mohammed Kindle, MD      . sodium chloride 0.9 % injection 20 mL  20 mL Other Once Mohammed Kindle, MD      . sodium chloride flush (NS) 0.9 % injection 20 mL  20 mL Other Once Mohammed Kindle, MD      . triamcinolone acetonide (KENALOG-40) injection 40 mg  40 mg Other Once Mohammed Kindle, MD      . triamcinolone acetonide (KENALOG-40) injection 40 mg  40 mg Other Once Mohammed Kindle, MD       Medications Prior to Admission  Medication Sig Dispense Refill  . alendronate (FOSAMAX) 70 MG tablet Take 70 mg by mouth once a week. Take with a full glass of water on an empty stomach.    Marland Kitchen aspirin 81 MG tablet Take 81 mg by mouth 2 (two) times daily.     Marland Kitchen atorvastatin (LIPITOR) 20 MG tablet Take 20 mg by mouth daily.    . cholecalciferol (VITAMIN D) 1000 units tablet Take 1,000 Units by mouth daily.    . clorazepate (TRANXENE) 7.5 MG tablet Take 7.5 mg by mouth 2 (two) times daily as needed for anxiety.    Marland Kitchen doxycycline (VIBRAMYCIN) 50 MG capsule Take 2 capsules (100 mg total) by mouth 2 (two) times daily. 28 capsule 0  . esomeprazole (NEXIUM) 40 MG capsule Take 40 mg by mouth daily at 12 noon.    . fexofenadine (ALLEGRA) 180 MG tablet Take 180 mg by mouth daily.    Marland Kitchen FLUoxetine (PROZAC) 20 MG capsule Take 20 mg by mouth 2 (two) times daily.    . fluticasone (FLONASE) 50 MCG/ACT nasal spray Place into both nostrils daily.    . Fluticasone-Salmeterol (ADVAIR) 100-50 MCG/DOSE AEPB Inhale 1 puff into the lungs 2 (two) times daily.    Marland Kitchen ipratropium (ATROVENT) 0.02 % nebulizer solution Take 0.5 mg by nebulization 4 (four) times daily as needed.     Marland Kitchen levothyroxine (SYNTHROID, LEVOTHROID) 50 MCG tablet Take 50 mcg by mouth daily before breakfast.    . losartan (COZAAR) 50 MG tablet Take 50 mg by mouth daily.    . magnesium oxide (MAG-OX) 400 MG tablet Take 400 mg by  mouth 2 (two) times daily.    . metoprolol succinate (TOPROL-XL) 50 MG 24 hr tablet Take 50 mg by mouth 2 (two) times daily. Take with or immediately following a meal.    . predniSONE (DELTASONE) 10 MG tablet Take 5 tablets (50 mg total) by mouth daily. [place]  tablet 0  . rOPINIRole (REQUIP) 0.5 MG tablet Take 0.5 mg by mouth at bedtime.    Marland Kitchen HYDROcodone-acetaminophen (NORCO/VICODIN) 5-325 MG tablet Limit 1 tab by mouth 2-3 times per day if tolerated 80 tablet 0  . meloxicam (MOBIC) 15 MG tablet Take 15 mg by mouth as needed for pain.      Results for orders placed or performed during the hospital encounter of 12/17/16 (from the past 48 hour(s))  Comprehensive metabolic panel     Status: Abnormal   Collection Time: 12/18/16 12:05 AM  Result Value Ref Range   Sodium 136 135 - 145 mmol/L   Potassium 4.6 3.5 - 5.1 mmol/L   Chloride 101 101 - 111 mmol/L   CO2 24 22 - 32 mmol/L   Glucose, Bld 167 (H) 65 - 99 mg/dL   BUN 25 (H) 6 - 20 mg/dL   Creatinine, Ser 1.17 (H) 0.44 - 1.00 mg/dL   Calcium 9.1 8.9 - 10.3 mg/dL   Total Protein 8.0 6.5 - 8.1 g/dL   Albumin 4.2 3.5 - 5.0 g/dL   AST 54 (H) 15 - 41 U/L   ALT 30 14 - 54 U/L   Alkaline Phosphatase 85 38 - 126 U/L   Total Bilirubin 1.1 0.3 - 1.2 mg/dL   GFR calc non Af Amer 42 (L) >60 mL/min   GFR calc Af Amer 49 (L) >60 mL/min    Comment: (NOTE) The eGFR has been calculated using the CKD EPI equation. This calculation has not been validated in all clinical situations. eGFR's persistently <60 mL/min signify possible Chronic Kidney Disease.    Anion gap 11 5 - 15  Troponin I     Status: Abnormal   Collection Time: 12/18/16 12:05 AM  Result Value Ref Range   Troponin I 0.03 (HH) <0.03 ng/mL    Comment: CRITICAL RESULT CALLED TO, READ BACK BY AND VERIFIED WITH ANDREA BRYANT ON 12/18/16 AT 0056 MNS   TSH     Status: Abnormal   Collection Time: 12/18/16 12:05 AM  Result Value Ref Range   TSH 15.210 (H) 0.350 - 4.500 uIU/mL    Comment:  Performed by a 3rd Generation assay with a functional sensitivity of <=0.01 uIU/mL.  Lipid panel     Status: Abnormal   Collection Time: 12/18/16 12:05 AM  Result Value Ref Range   Cholesterol 120 0 - 200 mg/dL   Triglycerides 81 <150 mg/dL   HDL 30 (L) >40 mg/dL   Total CHOL/HDL Ratio 4.0 RATIO   VLDL 16 0 - 40 mg/dL   LDL Cholesterol 74 0 - 99 mg/dL    Comment:        Total Cholesterol/HDL:CHD Risk Coronary Heart Disease Risk Table                     Men   Women  1/2 Average Risk   3.4   3.3  Average Risk       5.0   4.4  2 X Average Risk   9.6   7.1  3 X Average Risk  23.4   11.0        Use the calculated Patient Ratio above and the CHD Risk Table to determine the patient's CHD Risk.        ATP III CLASSIFICATION (LDL):  <100     mg/dL   Optimal  100-129  mg/dL   Near or Above  Optimal  130-159  mg/dL   Borderline  160-189  mg/dL   High  >190     mg/dL   Very High   Blood gas, arterial     Status: Abnormal (Preliminary result)   Collection Time: 12/18/16 12:47 AM  Result Value Ref Range   FIO2 0.28    pH, Arterial 7.45 7.350 - 7.450   pCO2 arterial 39 32.0 - 48.0 mmHg   pO2, Arterial 109 (H) 83.0 - 108.0 mmHg   Bicarbonate 27.1 20.0 - 28.0 mmol/L   Acid-Base Excess 2.9 (H) 0.0 - 2.0 mmol/L   O2 Saturation 98.5 %   Patient temperature 37.0    Collection site RIGHT RADIAL    Sample type ARTERIAL DRAW    Allens test (pass/fail) PASS PASS   Mechanical Rate PENDING   Urinalysis, Complete w Microscopic     Status: Abnormal   Collection Time: 12/18/16 12:47 AM  Result Value Ref Range   Color, Urine STRAW (A) YELLOW   APPearance CLEAR (A) CLEAR   Specific Gravity, Urine 1.006 1.005 - 1.030   pH 7.0 5.0 - 8.0   Glucose, UA NEGATIVE NEGATIVE mg/dL   Hgb urine dipstick SMALL (A) NEGATIVE   Bilirubin Urine NEGATIVE NEGATIVE   Ketones, ur NEGATIVE NEGATIVE mg/dL   Protein, ur 100 (A) NEGATIVE mg/dL   Nitrite NEGATIVE NEGATIVE   Leukocytes, UA  NEGATIVE NEGATIVE   RBC / HPF 0-5 0 - 5 RBC/hpf   WBC, UA 0-5 0 - 5 WBC/hpf   Bacteria, UA NONE SEEN NONE SEEN   Squamous Epithelial / LPF NONE SEEN NONE SEEN  CBC with Differential/Platelet     Status: Abnormal   Collection Time: 12/18/16 12:47 AM  Result Value Ref Range   WBC 16.0 (H) 3.6 - 11.0 K/uL   RBC 4.38 3.80 - 5.20 MIL/uL   Hemoglobin 12.3 12.0 - 16.0 g/dL   HCT 37.8 35.0 - 47.0 %   MCV 86.4 80.0 - 100.0 fL   MCH 28.2 26.0 - 34.0 pg   MCHC 32.6 32.0 - 36.0 g/dL   RDW 16.6 (H) 11.5 - 14.5 %   Platelets 267 150 - 440 K/uL   Neutrophils Relative % 78 %   Lymphocytes Relative 8 %   Monocytes Relative 14 %   Eosinophils Relative 0 %   Basophils Relative 0 %   Neutro Abs 12.5 (H) 1.4 - 6.5 K/uL   Lymphs Abs 1.3 1.0 - 3.6 K/uL   Monocytes Absolute 2.2 (H) 0.2 - 0.9 K/uL   Eosinophils Absolute 0.0 0 - 0.7 K/uL   Basophils Absolute 0.0 0 - 0.1 K/uL   RBC Morphology POLYCHROMASIA PRESENT     Comment: MIXED RBC POPULATION  Lactic acid, plasma     Status: Abnormal   Collection Time: 12/18/16 12:48 AM  Result Value Ref Range   Lactic Acid, Venous 2.6 (HH) 0.5 - 1.9 mmol/L    Comment: CRITICAL RESULT CALLED TO, READ BACK BY AND VERIFIED WITH ANDREA BRYANT ON 12/18/16 AT 0154 MNS   Lactic acid, plasma     Status: None   Collection Time: 12/18/16  3:48 AM  Result Value Ref Range   Lactic Acid, Venous 1.7 0.5 - 1.9 mmol/L   Ct Head Wo Contrast  Result Date: 12/18/2016 CLINICAL DATA:  Dyspnea tonight when getting up to use the restroom. Confusion and fatigue for the past week. Recently seen in the emergency department on April 30. EXAM: CT HEAD WITHOUT CONTRAST TECHNIQUE: Contiguous axial images  were obtained from the base of the skull through the vertex without intravenous contrast. COMPARISON:  12/14/2016, 03/16/2013. FINDINGS: Brain: There is no intracranial hemorrhage, mass or evidence of acute infarction. There is moderate generalized atrophy. There is moderate chronic  microvascular ischemic change. There is no significant extra-axial fluid collection. No acute intracranial findings are evident. Vascular: No hyperdense vessel or unexpected calcification. Skull: Normal. Negative for fracture or focal lesion. Sinuses/Orbits: No acute finding. Other: None. IMPRESSION: No acute intracranial findings. There is moderate generalized atrophy and chronic appearing white matter hypodensities which likely represent small vessel ischemic disease. Electronically Signed   By: Andreas Newport M.D.   On: 12/18/2016 01:00   Dg Chest Port 1 View  Result Date: 12/18/2016 CLINICAL DATA:  Hypertensive.  Altered mental status. EXAM: PORTABLE CHEST 1 VIEW COMPARISON:  12/14/2016 FINDINGS: Patchy airspace opacity in the central right lung. Left lung is clear. Mild vascular prominence. Unchanged mild cardiomegaly. No large pleural effusion. IMPRESSION: Central right lung opacity may represent infectious infiltrate or asymmetric alveolar edema. Unchanged cardiomegaly. Electronically Signed   By: Andreas Newport M.D.   On: 12/18/2016 00:11    Review of Systems  Constitutional: Negative for chills and fever.  HENT: Negative for sore throat and tinnitus.   Eyes: Negative for blurred vision and redness.  Respiratory: Positive for shortness of breath. Negative for cough.   Cardiovascular: Negative for chest pain, palpitations, orthopnea and PND.  Gastrointestinal: Negative for abdominal pain, diarrhea, nausea and vomiting.  Genitourinary: Negative for dysuria, frequency and urgency.  Musculoskeletal: Negative for joint pain and myalgias.  Skin: Negative for rash.       No lesions  Neurological: Positive for speech change. Negative for focal weakness and weakness.  Endo/Heme/Allergies: Does not bruise/bleed easily.       No temperature intolerance  Psychiatric/Behavioral: Positive for memory loss. Negative for depression and suicidal ideas.    Blood pressure (!) 168/93, pulse 90,  temperature 98.2 F (36.8 C), temperature source Oral, resp. rate 18, height 5' 1"  (1.549 m), weight 84.2 kg (185 lb 9.6 oz), SpO2 97 %. Physical Exam  Vitals reviewed. Constitutional: She is oriented to person, place, and time. She appears well-developed and well-nourished. No distress.  HENT:  Head: Normocephalic and atraumatic.  Mouth/Throat: Oropharynx is clear and moist.  Eyes: Conjunctivae and EOM are normal. Pupils are equal, round, and reactive to light. No scleral icterus.  Neck: Normal range of motion. Neck supple. No JVD present. No tracheal deviation present. No thyromegaly present.  Cardiovascular: Normal rate, regular rhythm and normal heart sounds.  Exam reveals no gallop and no friction rub.   No murmur heard. Respiratory: Effort normal and breath sounds normal.  Auto-PEEP breathing at times  GI: Soft. Bowel sounds are normal. She exhibits no distension. There is no tenderness.  Genitourinary:  Genitourinary Comments: Deferred  Musculoskeletal: Normal range of motion. She exhibits no edema.  Lymphadenopathy:    She has no cervical adenopathy.  Neurological: She is alert and oriented to person, place, and time. She has normal strength. No cranial nerve deficit or sensory deficit. She exhibits normal muscle tone. GCS eye subscore is 4. GCS verbal subscore is 5. GCS motor subscore is 6.  Abnormal rapid alternating movements; also difficulty processing multi-step directions  Skin: Skin is warm and dry. No rash noted. No erythema.  Psychiatric: She has a normal mood and affect. Her behavior is normal. Judgment and thought content normal.     Assessment/Plan This is an 83 year old female  admitted for acute kidney injury and hypertensive emergency. 1. Hypertensive urgency: Elevated troponin and encephalopathy indicates end-organ damage. Continue as needed antihypertensive medications. Differential diagnosis includes PRES, as well as physiologic response to cerebral ischemia. 2.  Pneumonia: Healthcare associated (the patient was recently discharged from skilled nursing facility for rehabilitation following back surgery); continue Levaquin. Add vancomycin. Check MRSA PCR. If negative discontinue vancomycin. 3. Dysarthria: Slurred speech persists. The patient also has dysdiadochokinesis. Obtain MRI. Consult neurology. Image carotids arteries. Continue aspirin 4. Acute kidney injury: Secondary to uncontrolled hypertension as well as decreased by mouth intake over the last week. Hydrate with intravenous fluid. Avoid nephrotoxic agents. Manage blood pressure. Chronic kidney disease stage III. Lactic acid elevation indicates dehydration in this case. 5. Hypothyroidism: Check TSH. Continue Synthroid 6. DVT prophylaxis: Heparin 7. GI prophylaxis: Pantoprazole per home regimen The patient is a full code. Time spent on admission orders and patient care approximately 45 minutes  Harrie Foreman, MD 12/18/2016, 8:38 AM

## 2016-12-18 NOTE — ED Notes (Signed)
Dr. Beather Arbour at the bedside to discuss results and plan of care.

## 2016-12-18 NOTE — Progress Notes (Signed)
Initial Nutrition Assessment  DOCUMENTATION CODES:   Obesity unspecified  INTERVENTION:  Provide Ensure Enlive po TID with meals, each supplement provides 350 kcal and 20 grams of protein.  Patient would benefit from assistance with setting up meals and encouragement to eat meals/drink supplements.  NUTRITION DIAGNOSIS:   Inadequate oral intake related to poor appetite as evidenced by per patient/family report.  GOAL:   Patient will meet greater than or equal to 90% of their needs  MONITOR:   PO intake, Supplement acceptance, Labs, Weight trends, I & O's  REASON FOR ASSESSMENT:   Consult (Verbal consult) Poor PO  ASSESSMENT:   83 year old female with PMHx of breast cancer in 2011 s/p lumpectomy and XRT, HTN, COPD, shingles 10/2015 presented with SOB found to have encephalopathy with possible underlying dementia, PNA, AKI, dysarthria.   -Per chart patient has reported chronic memory problems. -Patient was placed on dysphagia 2 diet with thin liquids following SLP evaluation. -Per chart unclear exactly which day she had her surgery. It was sometime between 2/23 (pre-op clearance from pulmonology) to 3/21 (day she had home care visit after surgery).  Spoke with patient at bedside. She reports she lost her appetite after her back surgery. She reports her appetite did start to come back after the surgery, but then she got very weak and has not been able to eat very well. She reports eating 2-3 meals per day. Patient is very vague about nutrition history. She reports she likes to eat [place] flounder and "different little things." Reports she finishes meals "when hungry" but unable to specify meal completion. She reports she did not have anything to eat yesterday but has had some macaroni and cheese today. Reports her husband often helps her set up her food and occasionally assists her with eating.   Patient reports her UBW is 155-160 lbs, which is not accurate per chart. RD obtained  bed scale weight of 180.1 lbs. Per chart she has been stable at around 180 lbs for the past year.  Medications reviewed and include: vitamin D 1000 units daily, Colace, famotidine, levothyroxine, magnesium oxide 400 mg BID, pantoprazole, NS @ 125 ml/hr, vancomycin.  Labs reviewed: BUN 25, Creatinine 1.17.  Nutrition-Focused physical exam completed. Findings are mild-moderate fat depletion noted only in upper arm region, no muscle depletion, and no edema.   Patient does not meet criteria for malnutrition.  Discussed with SLP. Patient has no bottom dentures and has difficulty chewing.  Diet Order:  DIET DYS 2 Room service appropriate? Yes with Assist; Fluid consistency: Thin  Skin:  Reviewed, no issues  Last BM:  12/17/2016  Height:   Ht Readings from Last 1 Encounters:  12/18/16 5\' 1"  (1.549 m)    Weight:   Wt Readings from Last 1 Encounters:  12/18/16 185 lb 9.6 oz (84.2 kg)    Ideal Body Weight:  47.7 kg  BMI:  Body mass index is 35.07 kg/m.  Estimated Nutritional Needs:   Kcal:  1495-1745 (MSJ x 1.2-1.4)  Protein:  75-90 grams (0.9-1.1 grams/kg, 20% estimated kcal needs)  Fluid:  1.5-1.7 L/day  EDUCATION NEEDS:   No education needs identified at this time  Willey Blade, MS, RD, LDN Pager: [phone] After Hours Pager: [phone]

## 2016-12-18 NOTE — ED Provider Notes (Signed)
[facility] Emergency Department Provider Note   ____________________________________________   First MD Initiated Contact with Patient 12/17/16 2336     (approximate)  I have reviewed the triage vital signs and the nursing notes.   HISTORY  Chief Complaint Shortness of Breath    HPI Janice Perkins is a 83 y.o. female brought to the ED from home via EMS with a chief complaint of lice weakness, shortness of breath and altered mental status. Patient was seen in the ED several days ago for same; found to have bronchitis and discharged home on prednisone and doxycycline. Patient wears 2 L oxygen at night and states this evening she experienced coughing with shortness of breath and "dry mouth". She tells me that one of her blood pressure medicines was recently decreased. States she has been taking her medicines as directed. Denies fever, chills, chest pain, abdominal pain, nausea, vomiting, diarrhea. Denies recent travel or trauma. Exertion makes her symptoms worse.   Past Medical History:  Diagnosis Date  . Asthma   . Breast cancer ([place]) 2011  . Bronchitis 04/2015  . Cancer [facility]) 2011   breast  . COPD (chronic obstructive pulmonary disease) ([place])   . Hypertension   . Shingles    10/2015    Patient Active Problem List   Diagnosis Date Noted  . Primary cancer of upper outer quadrant of right female breast ([place]) 04/07/2016  . Lumbar radiculopathy 03/23/2016  . Spinal stenosis, lumbar region, with neurogenic claudication 03/23/2016  . DDD (degenerative disc disease), lumbar 01/14/2015  . Lumbosacral facet joint syndrome ([place]) 01/14/2015  . Sacroiliac joint dysfunction 01/14/2015  . Greater trochanteric bursitis 01/14/2015  . Bilateral occipital neuralgia 01/14/2015    Past Surgical History:  Procedure Laterality Date  . BREAST EXCISIONAL BIOPSY Right 11/29/13   two areas  . BREAST EXCISIONAL BIOPSY Right 10/30/2009   lumpectomy - radiation     Prior to Admission medications   Medication Sig Start Date End Date Taking? Authorizing Provider  alendronate (FOSAMAX) 70 MG tablet Take 70 mg by mouth once a week. Take with a full glass of water on an empty stomach.   Yes Historical Provider, MD  aspirin 81 MG tablet Take 81 mg by mouth 2 (two) times daily.    Yes Historical Provider, MD  atorvastatin (LIPITOR) 20 MG tablet Take 20 mg by mouth daily.   Yes Historical Provider, MD  cholecalciferol (VITAMIN D) 1000 units tablet Take 1,000 Units by mouth daily.   Yes Historical Provider, MD  clorazepate (TRANXENE) 7.5 MG tablet Take 7.5 mg by mouth 2 (two) times daily as needed for anxiety.   Yes Historical Provider, MD  doxycycline (VIBRAMYCIN) 50 MG capsule Take 2 capsules (100 mg total) by mouth 2 (two) times daily. 12/14/16 12/21/16 Yes Darel Hong, MD  esomeprazole (NEXIUM) 40 MG capsule Take 40 mg by mouth daily at 12 noon.   Yes Historical Provider, MD  fexofenadine (ALLEGRA) 180 MG tablet Take 180 mg by mouth daily.   Yes Historical Provider, MD  FLUoxetine (PROZAC) 20 MG capsule Take 20 mg by mouth 2 (two) times daily.   Yes Historical Provider, MD  fluticasone (FLONASE) 50 MCG/ACT nasal spray Place into both nostrils daily.   Yes Historical Provider, MD  Fluticasone-Salmeterol (ADVAIR) 100-50 MCG/DOSE AEPB Inhale 1 puff into the lungs 2 (two) times daily.   Yes Historical Provider, MD  ipratropium (ATROVENT) 0.02 % nebulizer solution Take 0.5 mg by nebulization 4 (four) times daily as needed.  Yes Historical Provider, MD  levothyroxine (SYNTHROID, LEVOTHROID) 50 MCG tablet Take 50 mcg by mouth daily before breakfast.   Yes Historical Provider, MD  losartan (COZAAR) 50 MG tablet Take 50 mg by mouth daily.   Yes Historical Provider, MD  magnesium oxide (MAG-OX) 400 MG tablet Take 400 mg by mouth 2 (two) times daily.   Yes Historical Provider, MD  metoprolol succinate (TOPROL-XL) 50 MG 24 hr tablet Take 50 mg by mouth 2 (two) times  daily. Take with or immediately following a meal.   Yes Historical Provider, MD  predniSONE (DELTASONE) 10 MG tablet Take 5 tablets (50 mg total) by mouth daily. 12/14/16 12/18/16 Yes Darel Hong, MD  rOPINIRole (REQUIP) 0.5 MG tablet Take 0.5 mg by mouth at bedtime.   Yes Historical Provider, MD  HYDROcodone-acetaminophen (NORCO/VICODIN) 5-325 MG tablet Limit 1 tab by mouth 2-3 times per day if tolerated 04/14/16   Mohammed Kindle, MD  meloxicam (MOBIC) 15 MG tablet Take 15 mg by mouth as needed for pain.    Historical Provider, MD    Allergies Penicillins; Captopril; Enalapril maleate; Seldane  [terfenadine]; Tape; Augmentin [amoxicillin-pot clavulanate]; and Biaxin [clarithromycin]  Family History  Problem Relation Age of Onset  . Hypertension Mother   . Heart disease Mother   . Cancer Mother   . Stroke Father   . Hypertension Father   . Breast cancer Sister     Social History Social History  Substance Use Topics  . Smoking status: Never Smoker  . Smokeless tobacco: Never Used  . Alcohol use No    Review of Systems  Constitutional: Positive for generalized weakness. No fever/chills. Eyes: No visual changes. ENT: No sore throat. Cardiovascular: Denies chest pain. Respiratory: Positive for cough and shortness of breath. Gastrointestinal: No abdominal pain.  No nausea, no vomiting.  No diarrhea.  No constipation. Genitourinary: Negative for dysuria. Musculoskeletal: Negative for back pain. Skin: Negative for rash. Neurological: Negative for headaches, focal weakness or numbness.   ____________________________________________   PHYSICAL EXAM:  VITAL SIGNS: ED Triage Vitals  Enc Vitals Group     BP 12/17/16 2311 (!) 240/126     Pulse Rate 12/17/16 2311 95     Resp 12/17/16 2311 (!) 24     Temp 12/17/16 2311 98.2 F (36.8 C)     Temp Source 12/17/16 2311 Oral     SpO2 12/17/16 2309 93 %     Weight --      Height --      Head Circumference --      Peak Flow --       Pain Score 12/17/16 2311 0     Pain Loc --      Pain Edu? --      Excl. in [place]? --     Constitutional: Somnolent but arousable to voice. Frail appearing and in no acute distress. Eyes: Conjunctivae are normal. PERRL. EOMI. Head: Atraumatic. Nose: No congestion/rhinnorhea. Mouth/Throat: Mucous membranes are mildly dry.  Oropharynx non-erythematous. Neck: No stridor.  No carotid bruits. Cardiovascular: Normal rate, irregular rhythm. Grossly normal heart sounds.  Good peripheral circulation. Respiratory: Normal respiratory effort.  No retractions. Lungs scattered rhonchi. Gastrointestinal: Soft and nontender. No distention. No abdominal bruits. No CVA tenderness. Musculoskeletal: No lower extremity tenderness nor edema.  No joint effusions. Neurologic:  Normal speech and language. No gross focal neurologic deficits are appreciated. MAEx4. Skin:  Skin is warm, dry and intact. No rash noted. Psychiatric: Mood and affect are normal. Speech and behavior are  normal.  ____________________________________________   LABS (all labs ordered are listed, but only abnormal results are displayed)  Labs Reviewed  CULTURE, BLOOD (ROUTINE X 2)  CULTURE, BLOOD (ROUTINE X 2)  CBC WITH DIFFERENTIAL/PLATELET  COMPREHENSIVE METABOLIC PANEL  TROPONIN I  BLOOD GAS, ARTERIAL  URINALYSIS, COMPLETE (UACMP) WITH MICROSCOPIC  LACTIC ACID, PLASMA  LACTIC ACID, PLASMA   ____________________________________________  EKG  ED ECG REPORT I, SUNG,JADE J, the attending physician, personally viewed and interpreted this ECG.   Date: 12/18/2016  EKG Time: 2317  Rate: 79  Rhythm: normal EKG, normal sinus rhythm  Axis: LAD  Intervals:none  ST&T Change: Nonspecific  ____________________________________________  RADIOLOGY  Chest x-ray interpreted per Dr. Alroy Dust: Central right lung opacity may represent infectious infiltrate or  asymmetric alveolar edema. Unchanged cardiomegaly.   CT head interpreted  per Dr. Alroy Dust: No acute intracranial findings. There is moderate generalized  atrophy and chronic appearing white matter hypodensities which  likely represent small vessel ischemic disease.   ____________________________________________   PROCEDURES  Procedure(s) performed: None  Procedures  Critical Care performed: Yes, see critical care note(s)  CRITICAL CARE Performed by: Paulette Blanch   Total critical care time: 45 minutes  Critical care time was exclusive of separately billable procedures and treating other patients.  Critical care was necessary to treat or prevent imminent or life-threatening deterioration.  Critical care was time spent personally by me on the following activities: development of treatment plan with patient and/or surrogate as well as nursing, discussions with consultants, evaluation of patient's response to treatment, examination of patient, obtaining history from patient or surrogate, ordering and performing treatments and interventions, ordering and review of laboratory studies, ordering and review of radiographic studies, pulse oximetry and re-evaluation of patient's condition. ____________________________________________   INITIAL IMPRESSION / ASSESSMENT AND PLAN / ED COURSE  Pertinent labs & imaging results that were available during my care of the patient were reviewed by me and considered in my medical decision making (see chart for details).  83 year old female who returns to the ER for generalized weakness, cough, shortness of breath. Blood pressure found to be extremely elevated. Will proceed with urgent CT head, screening lab work including troponin and reassess.  Clinical Course as of Dec 18 40  Fri Dec 18, 2016  0040 Blood cultures ordered an IV antibiotic initiated for community-acquired pneumonia seen on chest x-ray. Administer nitroglycerin paste and hydralazine for her persistent hypertension.  [JS]    Clinical Course User  Index [JS] Paulette Blanch, MD     ____________________________________________   FINAL CLINICAL IMPRESSION(S) / ED DIAGNOSES  Final diagnoses:  Community acquired pneumonia of right middle lobe of lung ([place])  SOB (shortness of breath)  Hypertensive encephalopathy      NEW MEDICATIONS STARTED DURING THIS VISIT:  New Prescriptions   No medications on file     Note:  This document was prepared using Dragon voice recognition software and may include unintentional dictation errors.    Paulette Blanch, MD 12/18/16 [phone]

## 2016-12-19 ENCOUNTER — Inpatient Hospital Stay: Payer: Medicare Other

## 2016-12-19 LAB — HEMOGLOBIN A1C
Hgb A1c MFr Bld: 5.9 % — ABNORMAL HIGH (ref 4.8–5.6)
Mean Plasma Glucose: 123 mg/dL

## 2016-12-19 LAB — T3: T3 TOTAL: 92 ng/dL (ref 71–180)

## 2016-12-19 MED ORDER — HYDRALAZINE HCL 50 MG PO TABS
25.0000 mg | ORAL_TABLET | Freq: Three times a day (TID) | ORAL | Status: DC
  Administered 2016-12-19 – 2016-12-20 (×3): 25 mg via ORAL
  Filled 2016-12-19 (×3): qty 1

## 2016-12-19 MED ORDER — FUROSEMIDE 10 MG/ML IJ SOLN
20.0000 mg | Freq: Once | INTRAMUSCULAR | Status: AC
  Administered 2016-12-19: 15:00:00 20 mg via INTRAVENOUS
  Filled 2016-12-19: qty 2

## 2016-12-19 MED ORDER — HYDRALAZINE HCL 50 MG PO TABS
25.0000 mg | ORAL_TABLET | Freq: Once | ORAL | Status: AC
  Administered 2016-12-19: 25 mg via ORAL
  Filled 2016-12-19: qty 1

## 2016-12-19 MED ORDER — METOPROLOL SUCCINATE ER 50 MG PO TB24
100.0000 mg | ORAL_TABLET | Freq: Two times a day (BID) | ORAL | Status: DC
  Administered 2016-12-19 – 2016-12-22 (×6): 100 mg via ORAL
  Filled 2016-12-19 (×6): qty 2

## 2016-12-19 NOTE — Clinical Social Work Note (Signed)
Clinical Social Work Assessment  Patient Details  Name: Janice Perkins MRN: [id number] Date of Birth: 22-Apr-1933  Date of referral:  12/19/16               Reason for consult:  Facility Placement                Permission sought to share information with:  Chartered certified accountant granted to share information::  Yes, Verbal Permission Granted  Name::        Agency::     Relationship::     Contact Information:     Housing/Transportation Living arrangements for the past 2 months:  Single Family Home Source of Information:  Patient Patient Interpreter Needed:  None Criminal Activity/Legal Involvement Pertinent to Current Situation/Hospitalization:  No - Comment as needed Significant Relationships:  Adult Children, Spouse Lives with:  Spouse Do you feel safe going back to the place where you live?  Yes Need for family participation in patient care:  No (Coment)  Care giving concerns:  PT recommendation for STR   Social Worker assessment / plan:  The CSW met with the patient at bedside to discuss discharge planning. The patient gave verbal permission to conduct a bed search, and she indicated that her preference is for [facility] if possible. The CSW explained the Medicare guidelines for inpatient qualifying stay and payment schedule.  The patient lives in a single family home with her spouse, and she has a strong relationship with her daughters. The patient seems alert and oriented and able to make her own decisions.  Employment status:  Retired Forensic scientist:  [employer] PT Recommendations:  [place] / Referral to community resources:  [place]  Patient/Family's Response to care:  The patient thanked the CSW for assistance.  Patient/Family's Understanding of and Emotional Response to Diagnosis, Current Treatment, and Prognosis:  The patient understands the discharge plan to SNF and is in  agreement.  Emotional Assessment Appearance:  Appears stated age Attitude/Demeanor/Rapport:   (Pleasant) Affect (typically observed):  Appropriate, Pleasant Orientation:  Oriented to Self, Oriented to Place, Oriented to  Time, Oriented to Situation Alcohol / Substance use:  Never Used Psych involvement (Current and /or in the community):  No (Comment)  Discharge Needs  Concerns to be addressed:  Care Coordination, Discharge Planning Concerns Readmission within the last 30 days:  No Current discharge risk:  None Barriers to Discharge:  Continued Medical Work up   [employer], LCSW 12/19/2016, 3:52 PM

## 2016-12-19 NOTE — Clinical Social Work Placement (Addendum)
   CLINICAL SOCIAL WORK PLACEMENT  NOTE  Date:  12/19/2016  Patient Details  Name: Janice Perkins MRN: [id number] Date of Birth: August 16, 1933  Clinical Social Work is seeking post-discharge placement for this patient at the [place] level of care (*CSW will initial, date and re-position this form in  chart as items are completed):  Yes   Patient/family provided with [place] Work Department's list of facilities offering this level of care within the geographic area requested by the patient (or if unable, by the patient's family).  Yes   Patient/family informed of their freedom to choose among providers that offer the needed level of care, that participate in Medicare, Medicaid or managed care program needed by the patient, have an available bed and are willing to accept the patient.  Yes   Patient/family informed of [facility]'s ownership interest in [facility] and [facility], as well as of the fact that they are under no obligation to receive care at these facilities.  PASRR submitted to EDS on       PASRR number received on       Existing PASRR number confirmed on 12/19/16     FL2 transmitted to all facilities in geographic area requested by pt/family on 12/19/16     FL2 transmitted to all facilities within larger geographic area on       Patient informed that his/her managed care company has contracts with or will negotiate with certain facilities, including the following:         12-21-16   Patient/family informed of bed offers received. Evette Cristal, MSW, [place], 12-21-16)  Patient chooses bed at  [facility] (Evette Cristal, MSW, [place], 12-21-16)     Physician recommends and patient chooses bed at      Patient to be transferred to  [facility] on  12-22-16.  Patient to be transferred to facility by  [facility]     Patient family notified on  12-22-16 of transfer.  Name of family member notified:    Spouse [name]     PHYSICIAN       Additional Comment:    _______________________________________________ Zettie Pho, LCSW 12/19/2016, 3:56 PM   Jones Broom. Norval Morton, MSW, [place]  12/21/2016 4:38 PM

## 2016-12-19 NOTE — Plan of Care (Signed)
Problem: Education: Goal: Knowledge of [facility] Education information/materials will improve Outcome: Progressing Hypertensive during shift, received PRN IV Labetalol 10mg  x1, VS WDL at recheck.  Pt very confused, agitated, recevied PRN IV Haldol 1mg .  Refused neuro checks, PO meds.  No s/s of pain.  Bed in low position, call bell within reach.  WCTM.

## 2016-12-19 NOTE — Progress Notes (Addendum)
[place] [facility] [place] NAME: Janice Perkins    MR#:  [id number]  DATE OF BIRTH:  03/23/1933  SUBJECTIVE:  CHIEF COMPLAINT:  Patient is altered. Husband at bedside,He is reporting chronic memory problems with patient Agitated last night  REVIEW OF SYSTEMS:  Review of system unobtainable as the patient is altered from her baseline  DRUG ALLERGIES:   Allergies  Allergen Reactions  . Penicillins Hives  . Captopril Other (See Comments)  . Enalapril Maleate     Other reaction(s): Unknown  . Seldane  [Terfenadine] Other (See Comments)  . Tape Itching  . Augmentin [Amoxicillin-Pot Clavulanate] Rash  . Biaxin [Clarithromycin] Rash, Other (See Comments) and Hives    Other reaction(s): Unknown    VITALS:  Blood pressure (!) 166/132, pulse 78, temperature 98.3 F (36.8 C), temperature source Oral, resp. rate 18, height 5\' 1"  (1.549 m), weight 85.8 kg (189 lb 3.2 oz), SpO2 98 %.  PHYSICAL EXAMINATION:  GENERAL:  83 y.o.-year-old patient lying in the bed with no acute distress.  EYES: Pupils equal, round, reactive to light and accommodation. No scleral icterus.   HEENT: Head atraumatic, normocephalic. Oropharynx and nasopharynx clear.  NECK:  Supple, no jugular venous distention. No thyroid enlargement, no tenderness.  LUNGS: Mod breath sounds bilaterally, no wheezing, rales,rhonchi ; positive crepitation. No use of accessory muscles of respiration.  CARDIOVASCULAR: S1, S2 normal. No murmurs, rubs, or gallops.  ABDOMEN: Soft, nontender, nondistended. Bowel sounds present. No organomegaly or mass.  EXTREMITIES: No pedal edema, cyanosis, or clubbing.  NEUROLOGIC: Awake and alert and oriented 1-2  PSYCHIATRIC: The patient is alert and oriented x 1-2 SKIN: No obvious rash, lesion, or ulcer.    LABORATORY PANEL:   CBC  Recent Labs Lab 12/18/16 0047  WBC 16.0*  HGB 12.3  HCT 37.8  PLT 267    ------------------------------------------------------------------------------------------------------------------  Chemistries   Recent Labs Lab 12/18/16 0005  NA 136  K 4.6  CL 101  CO2 24  GLUCOSE 167*  BUN 25*  CREATININE 1.17*  CALCIUM 9.1  AST 54*  ALT 30  ALKPHOS 85  BILITOT 1.1   ------------------------------------------------------------------------------------------------------------------  Cardiac Enzymes  Recent Labs Lab 12/18/16 0005  TROPONINI 0.03*   ------------------------------------------------------------------------------------------------------------------  RADIOLOGY:  Ct Head Wo Contrast  Result Date: 12/18/2016 CLINICAL DATA:  Dyspnea tonight when getting up to use the restroom. Confusion and fatigue for the past week. Recently seen in the emergency department on April 30. EXAM: CT HEAD WITHOUT CONTRAST TECHNIQUE: Contiguous axial images were obtained from the base of the skull through the vertex without intravenous contrast. COMPARISON:  12/14/2016, 03/16/2013. FINDINGS: Brain: There is no intracranial hemorrhage, mass or evidence of acute infarction. There is moderate generalized atrophy. There is moderate chronic microvascular ischemic change. There is no significant extra-axial fluid collection. No acute intracranial findings are evident. Vascular: No hyperdense vessel or unexpected calcification. Skull: Normal. Negative for fracture or focal lesion. Sinuses/Orbits: No acute finding. Other: None. IMPRESSION: No acute intracranial findings. There is moderate generalized atrophy and chronic appearing white matter hypodensities which likely represent small vessel ischemic disease. Electronically Signed   By: Andreas Newport M.D.   On: 12/18/2016 01:00   Mr Brain Wo Contrast  Result Date: 12/18/2016 CLINICAL DATA:  Altered mental status since surgery 6 weeks ago. EXAM: MRI HEAD WITHOUT CONTRAST TECHNIQUE: Multiplanar, multiecho pulse sequences of the  brain and surrounding structures were obtained without intravenous contrast. COMPARISON:  Head CT earlier today.  Brain  MRI 03/16/2013 FINDINGS: Brain: No acute infarction, hemorrhage, hydrocephalus, extra-axial collection or mass lesion. Mild chronic microvascular ischemic change in the periventricular white matter. Generalized cerebral volume loss, commonly seen by this age but progressed from 2014. Presumed dilated perivascular spaces below the putamina. Vascular: Major flow voids are preserved Skull and upper cervical spine: New negative for marrow lesion Sinuses/Orbits: Mild patchy mucosal thickening in the paranasal sinuses. Status post endoscopic sinus surgery. Bilateral cataract resection. IMPRESSION: Senescent changes without acute finding. Electronically Signed   By: Monte Fantasia M.D.   On: 12/18/2016 12:28   US Carotid Bilateral (at [facility] Only)  Result Date: 12/18/2016 CLINICAL DATA:  83 year old female with a history of stroke. Cardiovascular risk factors include hypertension, known prior stroke/ TIA, hyperlipidemia EXAM: BILATERAL CAROTID DUPLEX ULTRASOUND TECHNIQUE: Pearline Cables scale imaging, color Doppler and duplex ultrasound were performed of bilateral carotid and vertebral arteries in the neck. COMPARISON:  No prior duplex FINDINGS: Criteria: Quantification of carotid stenosis is based on velocity parameters that correlate the residual internal carotid diameter with NASCET-based stenosis levels, using the diameter of the distal internal carotid lumen as the denominator for stenosis measurement. The following velocity measurements were obtained: RIGHT ICA:  Systolic 735 cm/sec, Diastolic 19 cm/sec CCA:  93 cm/sec SYSTOLIC ICA/CCA RATIO:  1.2 ECA:  156 cm/sec LEFT ICA:  Systolic 99 cm/sec, Diastolic 31 cm/sec CCA:  72 cm/sec SYSTOLIC ICA/CCA RATIO:  1.4 ECA:  156 cm/sec Right Brachial SBP: Not acquired Left Brachial SBP: Not acquired RIGHT CAROTID ARTERY: No significant calcifications of the  right common carotid artery. Intermediate waveform maintained. Heterogeneous and partially calcified plaque at the right carotid bifurcation. No significant lumen shadowing. Low resistance waveform of the right ICA. Mild tortuosity RIGHT VERTEBRAL ARTERY: Antegrade flow with low resistance waveform. LEFT CAROTID ARTERY: No significant calcifications of the left common carotid artery. Intermediate waveform maintained. Heterogeneous and partially calcified plaque at the left carotid bifurcation without significant lumen shadowing. Low resistance waveform of the left ICA. Mild tortuosity LEFT VERTEBRAL ARTERY:  Antegrade flow with low resistance waveform. IMPRESSION: Color duplex indicates minimal heterogeneous and calcified plaque, with no hemodynamically significant stenosis by duplex criteria in the extracranial cerebrovascular circulation. Signed, Dulcy Fanny. Earleen Newport, DO [facility] Electronically Signed   By: Corrie Mckusick D.O.   On: 12/18/2016 10:37   Dg Chest Port 1 View  Result Date: 12/18/2016 CLINICAL DATA:  Hypertensive.  Altered mental status. EXAM: PORTABLE CHEST 1 VIEW COMPARISON:  12/14/2016 FINDINGS: Patchy airspace opacity in the central right lung. Left lung is clear. Mild vascular prominence. Unchanged mild cardiomegaly. No large pleural effusion. IMPRESSION: Central right lung opacity may represent infectious infiltrate or asymmetric alveolar edema. Unchanged cardiomegaly. Electronically Signed   By: Andreas Newport M.D.   On: 12/18/2016 00:11    EKG:   Orders placed or performed during the hospital encounter of 12/17/16  . EKG 12-Lead  . EKG 12-Lead  . ED EKG  . ED EKG  . EKG 12-Lead  . EKG 12-Lead    ASSESSMENT AND PLAN:   #Encephalopathy with possible underlying dementia-secondary to pneumonia and acute kidney injury IV antibiotics and IV fluids. Monitor clinically  #. Hypertensive urgency: Blood pressure is elevated.  Continue Toprol-XL 50 mg home dose increased to 75 mg yesterday and to 100 mg today. Hydralazine 25 mg every 8 hours is added to the regimen Cozaar and titrate as needed   # Elevated troponin -non trending. Troponin less than 0.03, and  0.03   #. Pneumonia: Healthcare associated (the patient was recently discharged from skilled nursing facility for rehabilitation following back surgery); continue Levaquin,  vancomycin. Neg MRSA PCR discontinue vancomycin.  # Dysarthria: Slurred speech improved The patient also has dysdiadochokinesis.  CT head with no acute findings. MRI no acute changes. Dysarthria is assumed to be from  Metabolic issues .follow-up with neurology  Continue aspirin per atrial fibrillation   #Acute kidney injury and chronic kidney disease stage III : secondary to dehydration  continue IV fluids. Avoid nephrotoxic agents.Lactic acid elevation indicates dehydration in this case.  #. Hypothyroidism:  TSH 15.2, free T4 1.13 ,almost normal. Increased Synthroid to 75 g from home dose 50  #Sundowning with agitation-recommended to reorient the patient and if possible family members to stay with the patient overnight  # DVT prophylaxis: Heparin  #   GERD with regurgitation -GI prophylaxis: Pantoprazole per home regimen, Pepcid added    Disposition PT recommends skilled nursing facility  All the records are reviewed and case discussed with Care Management/Social Workerr. Management plans discussed with the patient, family and they are in agreement.  CODE STATUS: FC  TOTAL TIME TAKING CARE OF THIS PATIENT: 45  minutes.   POSSIBLE D/C IN 2 DAYS, DEPENDING ON CLINICAL CONDITION.  Note: This dictation was prepared with Dragon dictation along with smaller phrase technology. Any transcriptional errors that result from this process are unintentional.   Nicholes Mango M.D on 12/19/2016 at 12:46 PM  Between 7am to 6pm - Pager - [phone] After 6pm go to [email or web address] - password  [email or web address]  Office  [phone]  CC: Primary care physician; [name], MD

## 2016-12-19 NOTE — NC FL2 (Signed)
[place] LEVEL OF CARE SCREENING TOOL     IDENTIFICATION  Patient Name: Janice Perkins Birthdate: 11-17-1932 Sex: female Admission Date (Current Location): 12/17/2016  [place] and [place] Number:  Engineering geologist and Address:  [facility], [address], [place], [place] [address]      Provider Number: [id number]  Attending Physician Name and Address:  Nicholes Mango, MD  Relative Name and Phone Number:       Current Level of Care: Hospital Recommended Level of Care: [place] Prior Approval Number:    Date Approved/Denied:   PASRR Number: [id number]  Discharge Plan: SNF    Current Diagnoses: Patient Active Problem List   Diagnosis Date Noted  . AKI (acute kidney injury) ([place]) 12/18/2016  . Primary cancer of upper outer quadrant of right female breast ([place]) 04/07/2016  . Lumbar radiculopathy 03/23/2016  . Spinal stenosis, lumbar region, with neurogenic claudication 03/23/2016  . DDD (degenerative disc disease), lumbar 01/14/2015  . Lumbosacral facet joint syndrome ([place]) 01/14/2015  . Sacroiliac joint dysfunction 01/14/2015  . Greater trochanteric bursitis 01/14/2015  . Bilateral occipital neuralgia 01/14/2015    Orientation RESPIRATION BLADDER Height & Weight     Self, Time, Situation, Place  O2 (2L) Incontinent Weight: 189 lb 3.2 oz (85.8 kg) Height:  5\' 1"  (154.9 cm)  BEHAVIORAL SYMPTOMS/MOOD NEUROLOGICAL BOWEL NUTRITION STATUS      Continent Diet (Dysphagia level 2(minced/cut) moistened well; Thin liquids; general aspiration precautions; REFLUX precautions)  AMBULATORY STATUS COMMUNICATION OF NEEDS Skin   Extensive Assist Verbally Normal                       Personal Care Assistance Level of Assistance  Bathing, Feeding, Dressing Bathing Assistance: Limited assistance Feeding assistance: Independent Dressing Assistance: Limited assistance     Functional Limitations Info              SPECIAL CARE FACTORS FREQUENCY  PT (By licensed PT), OT (By licensed OT)     PT Frequency: up to 5X per day, 5 days per week OT Frequency: up to 5X per day, 5 days per week            Contractures Contractures Info: Present    Additional Factors Info  Allergies   Allergies Info: Penicillins, Captopril, Enalapril Maleate, Seldane  Terfenadine, Tape, Augmentin Amoxicillin-pot Clavulanate, Biaxin Clarithromycin           Current Medications (12/19/2016):  This is the current hospital active medication list Current Facility-Administered Medications  Medication Dose Route Frequency Provider Last Rate Last Dose  . 0.9 %  sodium chloride infusion   Intravenous Continuous Parisha Beaulac, MD 75 mL/hr at 12/19/16 1300    . acetaminophen (TYLENOL) tablet 650 mg  650 mg Oral Q6H PRN Harrie Foreman, MD       Or  . acetaminophen (TYLENOL) suppository 650 mg  650 mg Rectal Q6H PRN Harrie Foreman, MD      . aspirin suppository 300 mg  300 mg Rectal Daily Harrie Foreman, MD       Or  . aspirin tablet 325 mg  325 mg Oral Daily Harrie Foreman, MD   325 mg at 12/18/16 1043  . atorvastatin (LIPITOR) tablet 20 mg  20 mg Oral q1800 Harrie Foreman, MD      . cholecalciferol (VITAMIN D) tablet 1,000 Units  1,000 Units Oral Daily Harrie Foreman, MD   1,000 Units at  12/18/16 1043  . clorazepate (TRANXENE) tablet 7.5 mg  7.5 mg Oral BID PRN Harrie Foreman, MD      . docusate sodium (COLACE) capsule 100 mg  100 mg Oral BID Harrie Foreman, MD   100 mg at 12/18/16 1043  . famotidine (PEPCID) tablet 20 mg  20 mg Oral BID Burnette Valenti, MD   20 mg at 12/18/16 1058  . FLUoxetine (PROZAC) capsule 20 mg  20 mg Oral BID Harrie Foreman, MD   20 mg at 12/18/16 1043  . fluticasone (FLONASE) 50 MCG/ACT nasal spray 1 spray  1 spray Each Nare Daily Harrie Foreman, MD   1 spray at 12/18/16 1044  . haloperidol lactate (HALDOL) injection 1 mg  1 mg Intravenous Q6H PRN Loletha Grayer, MD   1 mg at 12/19/16 1222  . heparin injection 5,000 Units  5,000 Units Subcutaneous Q8H Harrie Foreman, MD   5,000 Units at 12/19/16 1527  . hydrALAZINE (APRESOLINE) tablet 25 mg  25 mg Oral Q8H Tajay Muzzy, MD   25 mg at 12/19/16 1527  . HYDROcodone-acetaminophen (NORCO/VICODIN) 5-325 MG per tablet 1 tablet  1 tablet Oral Q6H PRN Harrie Foreman, MD      . ipratropium (ATROVENT) nebulizer solution 0.5 mg  0.5 mg Nebulization QID PRN Harrie Foreman, MD   0.5 mg at 12/19/16 1434  . labetalol (NORMODYNE,TRANDATE) injection 10 mg  10 mg Intravenous Q2H PRN Loletha Grayer, MD   10 mg at 12/19/16 1335  . levofloxacin (LEVAQUIN) IVPB 750 mg  750 mg Intravenous Q48H Nicholes Mango, MD   Stopped at 12/19/16 0956  . levothyroxine (SYNTHROID, LEVOTHROID) tablet 75 mcg  75 mcg Oral QAC breakfast Roberto Romanoski, MD   75 mcg at 12/18/16 1113  . loratadine (CLARITIN) tablet 10 mg  10 mg Oral Daily Harrie Foreman, MD   10 mg at 12/18/16 1044  . losartan (COZAAR) tablet 50 mg  50 mg Oral Daily Harrie Foreman, MD   50 mg at 12/19/16 0300  . magnesium oxide (MAG-OX) tablet 400 mg  400 mg Oral BID Harrie Foreman, MD   400 mg at 12/18/16 1043  . meloxicam (MOBIC) tablet 15 mg  15 mg Oral Daily PRN Harrie Foreman, MD      . metoprolol succinate (TOPROL-XL) 24 hr tablet 100 mg  100 mg Oral BID WC Michaelia Beilfuss, MD      . mometasone-formoterol (DULERA) 100-5 MCG/ACT inhaler 2 puff  2 puff Inhalation BID Harrie Foreman, MD   2 puff at 12/19/16 [phone]  . ondansetron (ZOFRAN) tablet 4 mg  4 mg Oral Q6H PRN Harrie Foreman, MD       Or  . ondansetron [facility]) injection 4 mg  4 mg Intravenous Q6H PRN Harrie Foreman, MD      . pantoprazole (PROTONIX) EC tablet 80 mg  80 mg Oral Q1200 Harrie Foreman, MD   80 mg at 12/18/16 1646  . QUEtiapine (SEROQUEL) tablet 12.5 mg  12.5 mg Oral QHS Wieting, Richard, MD      . rOPINIRole (REQUIP) tablet 0.5 mg  0.5 mg Oral QHS Harrie Foreman, MD      . vancomycin (VANCOCIN) IVPB 1000 mg/200 mL premix  1,000 mg Intravenous Once Harrie Foreman, MD         Discharge Medications: Please see discharge summary for a list of discharge medications.  Relevant Imaging Results:  Relevant Lab Results:  Additional Information SS# [id number]  Zettie Pho, LCSW

## 2016-12-19 NOTE — Progress Notes (Signed)
Dr. Margaretmary Eddy reviewed EKG and aware BP remains elevated. Will continue to monitor.

## 2016-12-19 NOTE — Progress Notes (Signed)
Dr. Margaretmary Eddy notified 9 beat run v-tach with wide QRS. EKG ordered. Will continue to monitor.

## 2016-12-19 NOTE — Progress Notes (Addendum)
0732am: BP 191/125 HR 87 0739am: Labetalol 10 mg PRN given  0818am: 185/117 HR 89 0821am: Daily Metoprolol 75mg  & Cozaar 50 mg given as ordered.  0835am: Dr. Margaretmary Eddy paged to be notified of continued Hypertension.  0845am: Dr. Margaretmary Eddy aware. No new orders at this time. Will continue to closely monitor.   0937am: BP 192/108 HR 83 0941am: Labetalol 10mg  PRN given again per order.  1020am: Dr. Margaretmary Eddy paged to be notified BP still 189/105 HR 83. See new order and MD to come see patient.

## 2016-12-19 NOTE — Progress Notes (Signed)
Spoke with Dr Margaretmary Eddy about blood pressure, made her aware that pt isn't putting out a lot of urine and that blood pressure remains high, MD states that she has adjusted pts scheduled meds and that this should help with the blood pressure, new order per MD to get a portable chest xray and to give lasix 20mg  IV once

## 2016-12-20 LAB — CBC
HCT: 36.1 % (ref 35.0–47.0)
Hemoglobin: 12 g/dL (ref 12.0–16.0)
MCH: 28.3 pg (ref 26.0–34.0)
MCHC: 33.2 g/dL (ref 32.0–36.0)
MCV: 85.1 fL (ref 80.0–100.0)
PLATELETS: 249 10*3/uL (ref 150–440)
RBC: 4.24 MIL/uL (ref 3.80–5.20)
RDW: 16.5 % — AB (ref 11.5–14.5)
WBC: 9.2 10*3/uL (ref 3.6–11.0)

## 2016-12-20 LAB — BLOOD GAS, ARTERIAL
Acid-Base Excess: 2.9 mmol/L — ABNORMAL HIGH (ref 0.0–2.0)
Bicarbonate: 27.1 mmol/L (ref 20.0–28.0)
FIO2: 0.28
O2 SAT: 98.5 %
PATIENT TEMPERATURE: 37
PO2 ART: 109 mmHg — AB (ref 83.0–108.0)
pCO2 arterial: 39 mmHg (ref 32.0–48.0)
pH, Arterial: 7.45 (ref 7.350–7.450)

## 2016-12-20 LAB — BASIC METABOLIC PANEL
Anion gap: 7 (ref 5–15)
BUN: 25 mg/dL — AB (ref 6–20)
CHLORIDE: 103 mmol/L (ref 101–111)
CO2: 27 mmol/L (ref 22–32)
CREATININE: 1.05 mg/dL — AB (ref 0.44–1.00)
Calcium: 8.6 mg/dL — ABNORMAL LOW (ref 8.9–10.3)
GFR calc Af Amer: 56 mL/min — ABNORMAL LOW (ref >60)
GFR calc non Af Amer: 48 mL/min — ABNORMAL LOW (ref >60)
Glucose, Bld: 109 mg/dL — ABNORMAL HIGH (ref 65–99)
Potassium: 3 mmol/L — ABNORMAL LOW (ref 3.5–5.1)
Sodium: 137 mmol/L (ref 135–145)

## 2016-12-20 LAB — MAGNESIUM: Magnesium: 1.7 mg/dL (ref 1.7–2.4)

## 2016-12-20 MED ORDER — ALPRAZOLAM 0.5 MG PO TABS: 0.2500 mg | ORAL_TABLET | Freq: Three times a day (TID) | ORAL | Status: DC | PRN

## 2016-12-20 MED ORDER — LEVOFLOXACIN 750 MG PO TABS
750.0000 mg | ORAL_TABLET | ORAL | Status: DC
  Administered 2016-12-21: 750 mg via ORAL
  Filled 2016-12-20: qty 1

## 2016-12-20 MED ORDER — ORAL CARE MOUTH RINSE
15.0000 mL | Freq: Two times a day (BID) | OROMUCOSAL | Status: DC
  Administered 2016-12-20 – 2016-12-21 (×4): 15 mL via OROMUCOSAL

## 2016-12-20 MED ORDER — LOSARTAN POTASSIUM 50 MG PO TABS
100.0000 mg | ORAL_TABLET | Freq: Every day | ORAL | Status: DC
  Administered 2016-12-20 – 2016-12-22 (×3): 100 mg via ORAL
  Filled 2016-12-20 (×3): qty 2

## 2016-12-20 MED ORDER — HYDRALAZINE HCL 50 MG PO TABS
25.0000 mg | ORAL_TABLET | Freq: Four times a day (QID) | ORAL | Status: DC
  Administered 2016-12-20 – 2016-12-22 (×9): 25 mg via ORAL
  Filled 2016-12-20 (×9): qty 1

## 2016-12-20 MED ORDER — HYDROCHLOROTHIAZIDE 25 MG PO TABS
25.0000 mg | ORAL_TABLET | Freq: Every day | ORAL | Status: DC
  Administered 2016-12-20 – 2016-12-22 (×3): 25 mg via ORAL
  Filled 2016-12-20 (×2): qty 1

## 2016-12-20 MED ORDER — POLYETHYLENE GLYCOL 3350 17 G PO PACK
17.0000 g | PACK | Freq: Every day | ORAL | Status: DC
  Administered 2016-12-20 – 2016-12-22 (×3): 17 g via ORAL
  Filled 2016-12-20 (×3): qty 1

## 2016-12-20 MED ORDER — SALINE SPRAY 0.65 % NA SOLN
1.0000 | NASAL | Status: DC | PRN
  Administered 2016-12-20: 1 via NASAL
  Filled 2016-12-20: qty 44

## 2016-12-20 MED ORDER — PANTOPRAZOLE SODIUM 40 MG PO TBEC
40.0000 mg | DELAYED_RELEASE_TABLET | Freq: Every day | ORAL | Status: DC
  Administered 2016-12-21: 13:00:00 40 mg via ORAL
  Filled 2016-12-20: qty 1

## 2016-12-20 MED ORDER — HYDROCHLOROTHIAZIDE 25 MG PO TABS
25.0000 mg | ORAL_TABLET | Freq: Every day | ORAL | Status: DC
  Filled 2016-12-20: qty 1

## 2016-12-20 MED ORDER — POTASSIUM CHLORIDE CRYS ER 20 MEQ PO TBCR
40.0000 meq | EXTENDED_RELEASE_TABLET | Freq: Two times a day (BID) | ORAL | Status: DC
  Administered 2016-12-20 – 2016-12-22 (×5): 40 meq via ORAL
  Filled 2016-12-20 (×5): qty 2

## 2016-12-20 MED ORDER — SALINE SPRAY 0.65 % NA SOLN: 1.0000 | NASAL | Status: DC | PRN

## 2016-12-20 NOTE — Progress Notes (Signed)
PHARMACIST - PHYSICIAN COMMUNICATION DR:   Margaretmary Eddy CONCERNING: Antibiotic IV to Oral Route Change Policy  RECOMMENDATION: This patient is receiving Levofloxacin by the intravenous route.  Based on criteria approved by the Pharmacy and Therapeutics Committee, the antibiotic(s) is/are being converted to the equivalent oral dose form(s).   DESCRIPTION: These criteria include:  Patient being treated for a respiratory tract infection, urinary tract infection, cellulitis or clostridium difficile associated diarrhea if on metronidazole  The patient is not neutropenic and does not exhibit a GI malabsorption state  The patient is eating (either orally or via tube) and/or has been taking other orally administered medications for a least 24 hours  The patient is improving clinically and has a Tmax < 100.5  If you have questions about this conversion, please contact the Pharmacy Department  []   [phone] )  [facility] []   [phone] )  [facility] []   [phone] )  [facility] []   [phone] )  [facility] []   [phone] )  [facility]

## 2016-12-20 NOTE — Progress Notes (Addendum)
[facility] [place] NAME: Janice Perkins    MR#:  [id number]  DATE OF BIRTH:  05/15/1933  SUBJECTIVE:  CHIEF COMPLAINT:  Patient is alteredBut more awake and alert today. Daughter and grandson at bedside. Slept well last night as daughter stayed with her overnight   REVIEW OF SYSTEMS:  Review of system unobtainable as the patient is altered from her baseline  DRUG ALLERGIES:   Allergies  Allergen Reactions  . Penicillins Hives  . Captopril Other (See Comments)  . Enalapril Maleate     Other reaction(s): Unknown  . Seldane  [Terfenadine] Other (See Comments)  . Tape Itching  . Augmentin [Amoxicillin-Pot Clavulanate] Rash  . Biaxin [Clarithromycin] Rash, Other (See Comments) and Hives    Other reaction(s): Unknown    VITALS:  Blood pressure (!) 167/90, pulse 80, temperature 98.3 F (36.8 C), resp. rate 19, height 5\' 1"  (1.549 m), weight 85 kg (187 lb 6.3 oz), SpO2 97 %.  PHYSICAL EXAMINATION:  GENERAL:  83 y.o.-year-old patient lying in the bed with no acute distress.  EYES: Pupils equal, round, reactive to light and accommodation. No scleral icterus.   HEENT: Head atraumatic, normocephalic. Oropharynx and nasopharynx clear.  NECK:  Supple, no jugular venous distention. No thyroid enlargement, no tenderness.  LUNGS: Mod breath sounds bilaterally, no wheezing, rales,rhonchi ; positive crepitation. No use of accessory muscles of respiration.  CARDIOVASCULAR: S1, S2 normal. No murmurs, rubs, or gallops.  ABDOMEN: Soft, nontender, nondistended. Bowel sounds present. No organomegaly or mass.  EXTREMITIES: No pedal edema, cyanosis, or clubbing.  NEUROLOGIC: Awake and alert and oriented 1-2  PSYCHIATRIC: The patient is alert and oriented x 1-2 SKIN: No obvious rash, lesion, or ulcer.    LABORATORY PANEL:   CBC  Recent Labs Lab 12/20/16 0444  WBC 9.2  HGB 12.0  HCT 36.1  PLT 249    ------------------------------------------------------------------------------------------------------------------  Chemistries   Recent Labs Lab 12/18/16 0005 12/20/16 0444  NA 136 137  K 4.6 3.0*  CL 101 103  CO2 24 27  GLUCOSE 167* 109*  BUN 25* 25*  CREATININE 1.17* 1.05*  CALCIUM 9.1 8.6*  MG  --  1.7  AST 54*  --   ALT 30  --   ALKPHOS 85  --   BILITOT 1.1  --    ------------------------------------------------------------------------------------------------------------------  Cardiac Enzymes  Recent Labs Lab 12/18/16 0005  TROPONINI 0.03*   ------------------------------------------------------------------------------------------------------------------  RADIOLOGY:  Dg Chest Port 1 View  Result Date: 12/19/2016 CLINICAL DATA:  Hypertension. Hx of asthma, breast cancer, bronchitis, COPD. Nonsmoker. Right breast biopsy. EXAM: PORTABLE CHEST 1 VIEW COMPARISON:  12/17/2016 FINDINGS: Mildly degraded exam due to AP portable technique and patient body habitus. Midline trachea. Cardiomegaly accentuated by AP portable technique. Numerous leads and wires project over the chest. Atherosclerosis in the transverse aorta. Layering right pleural effusion is similar. No pneumothorax. Right-sided interstitial and airspace disease is lower lobe predominant. IMPRESSION: Given differences in technique, similar right pleural fluid with asymmetric pulmonary edema versus pneumonia. Cardiomegaly and aortic atherosclerosis. Electronically Signed   By: Abigail Miyamoto M.D.   On: 12/19/2016 14:27    EKG:   Orders placed or performed during the hospital encounter of 12/17/16  . EKG 12-Lead  . EKG 12-Lead  . ED EKG  . ED EKG  . EKG 12-Lead  . EKG 12-Lead    ASSESSMENT AND PLAN:   #Encephalopathy with possible underlying dementia-secondary to pneumonia and acute kidney injury IV antibiotics  and IV fluids. Monitor clinically  #. Hypertensive urgency: Blood pressure is Slightly better.  Continue Toprol-XL 50 mg home dose increased to 75 mg yesterday and to 100 mg . Hydralazine 25 mg every 8 hours increased to every 6 hours. Cozaar dose increased from 50-to 100 mg once daily . Patient being elderly with other comorbidities goal is to maintain her systolic blood pressure at around 150-160. His blood pressure is persistently elevated will consider adding hydrochlorothiazide to the regimen. Patient's blood pressure at the time of arrival was 213/126 and 240/126 Patient being elderly with other comorbidities are systolic blood pressure goal is 010-071, diastolic 95   # Elevated troponin -non trending. Troponin less than 0.03, and 0.03   #. Pneumonia: Healthcare associated (the patient was recently discharged from skilled nursing facility for rehabilitation following back surgery); continue Levaquin,  vancomycin. Neg MRSA PCR discontinue vancomycin.  # Dysarthria: Slurred speech improved The patient also has dysdiadochokinesis.  CT head with no acute findings. MRI no acute changes. Dysarthria is assumed to be from  Metabolic issues .follow-up with neurology  Continue aspirin per atrial fibrillation   #Acute kidney injury and chronic kidney disease stage III : secondary to dehydration   Renal function improved with BUN at 25 and creatinine 1.05. Discontinue IV fluids encouraged by mouth intake Avoid nephrotoxic agents.Lactic acid elevation indicates dehydration in this case.  #. Hypothyroidism:  TSH 15.2, free T4 1.13 ,almost normal. Increased Synthroid to 75 g from home dose 50  #Sundowning with agitation-recommended to reorient the patient and if possible family members to stay with the patient overnight. Daughter stayed with the patient overnight, as per her report patient slept well last night  # DVT prophylaxis: Heparin  #   GERD with regurgitation -GI prophylaxis: Pantoprazole per home regimen, Pepcid added    Disposition PT recommends skilled nursing facility, patient and  family are agreeable for placement  All the records are reviewed and case discussed with Care Management/Social Workerr. Management plans discussed with the patient, family and they are in agreement.  CODE STATUS: FC  TOTAL TIME TAKING CARE OF THIS PATIENT: 35  minutes.   POSSIBLE D/C IN 2 DAYS, DEPENDING ON CLINICAL CONDITION.  Note: This dictation was prepared with Dragon dictation along with smaller phrase technology. Any transcriptional errors that result from this process are unintentional.   Nicholes Mango M.D on 12/20/2016 at 12:40 PM  Between 7am to 6pm - Pager - [phone] After 6pm go to [email or web address] - password [email or web address] [facility]  Office  [phone]  CC: Primary care physician; [name], MD

## 2016-12-21 LAB — BASIC METABOLIC PANEL
Anion gap: 6 (ref 5–15)
BUN: 24 mg/dL — AB (ref 6–20)
CO2: 29 mmol/L (ref 22–32)
Calcium: 9.1 mg/dL (ref 8.9–10.3)
Chloride: 104 mmol/L (ref 101–111)
Creatinine, Ser: 1.04 mg/dL — ABNORMAL HIGH (ref 0.44–1.00)
GFR calc Af Amer: 56 mL/min — ABNORMAL LOW (ref >60)
GFR, EST NON AFRICAN AMERICAN: 49 mL/min — AB (ref >60)
GLUCOSE: 99 mg/dL (ref 65–99)
POTASSIUM: 4.2 mmol/L (ref 3.5–5.1)
Sodium: 139 mmol/L (ref 135–145)

## 2016-12-21 MED ORDER — LACTULOSE 10 GM/15ML PO SOLN
30.0000 g | Freq: Once | ORAL | Status: AC
  Administered 2016-12-21: 30 g via ORAL
  Filled 2016-12-21: qty 60

## 2016-12-21 MED ORDER — QUETIAPINE FUMARATE 25 MG PO TABS
25.0000 mg | ORAL_TABLET | Freq: Every day | ORAL | Status: DC
  Administered 2016-12-21: 20:00:00 25 mg via ORAL
  Filled 2016-12-21: qty 1

## 2016-12-21 MED ORDER — ENSURE ENLIVE PO LIQD
237.0000 mL | Freq: Three times a day (TID) | ORAL | Status: DC
  Administered 2016-12-21 (×2): 237 mL via ORAL

## 2016-12-21 NOTE — Progress Notes (Signed)
[facility] [place] NAME: Janice Perkins    MR#:  [id number]  DATE OF BIRTH:  11-20-1932  SUBJECTIVE:  CHIEF COMPLAINT:  Patient is altered But more awake and alert today. Daughter and grandson at bedside.   REVIEW OF SYSTEMS:  Review of system unobtainable as the patient is altered from her baseline  DRUG ALLERGIES:   Allergies  Allergen Reactions  . Penicillins Hives  . Captopril Other (See Comments)  . Enalapril Maleate     Other reaction(s): Unknown  . Seldane  [Terfenadine] Other (See Comments)  . Tape Itching  . Augmentin [Amoxicillin-Pot Clavulanate] Rash  . Biaxin [Clarithromycin] Rash, Other (See Comments) and Hives    Other reaction(s): Unknown    VITALS:  Blood pressure (!) 157/80, pulse 77, temperature 98.3 F (36.8 C), temperature source Oral, resp. rate 18, height 5\' 1"  (1.549 m), weight 85 kg (187 lb 6.3 oz), SpO2 100 %.  PHYSICAL EXAMINATION:  GENERAL:  83 y.o.-year-old patient lying in the bed with no acute distress.  EYES: Pupils equal, round, reactive to light and accommodation. No scleral icterus.   HEENT: Head atraumatic, normocephalic. Oropharynx and nasopharynx clear.  NECK:  Supple, no jugular venous distention. No thyroid enlargement, no tenderness.  LUNGS: Mod breath sounds bilaterally, no wheezing, rales,rhonchi ; positive crepitation. No use of accessory muscles of respiration.  CARDIOVASCULAR: S1, S2 normal. No murmurs, rubs, or gallops.  ABDOMEN: Soft, nontender, nondistended. Bowel sounds present. No organomegaly or mass.  EXTREMITIES: No pedal edema, cyanosis, or clubbing.  NEUROLOGIC: Awake and alert and oriented 1-2  PSYCHIATRIC: The patient is alert and oriented x 1-2 SKIN: No obvious rash, lesion, or ulcer.    LABORATORY PANEL:   CBC  Recent Labs Lab 12/20/16 0444  WBC 9.2  HGB 12.0  HCT 36.1  PLT 249    ------------------------------------------------------------------------------------------------------------------  Chemistries   Recent Labs Lab 12/18/16 0005 12/20/16 0444 12/21/16 0431  NA 136 137 139  K 4.6 3.0* 4.2  CL 101 103 104  CO2 24 27 29   GLUCOSE 167* 109* 99  BUN 25* 25* 24*  CREATININE 1.17* 1.05* 1.04*  CALCIUM 9.1 8.6* 9.1  MG  --  1.7  --   AST 54*  --   --   ALT 30  --   --   ALKPHOS 85  --   --   BILITOT 1.1  --   --    ------------------------------------------------------------------------------------------------------------------  Cardiac Enzymes  Recent Labs Lab 12/18/16 0005  TROPONINI 0.03*   ------------------------------------------------------------------------------------------------------------------  RADIOLOGY:  No results found.  EKG:   Orders placed or performed during the hospital encounter of 12/17/16  . EKG 12-Lead  . EKG 12-Lead  . ED EKG  . ED EKG  . EKG 12-Lead  . EKG 12-Lead    ASSESSMENT AND PLAN:   #Encephalopathy with possible underlying dementia-secondary to pneumonia and acute kidney injury IV antibiotics and IV fluids. Monitor clinically  #. Hypertensive urgency: Blood pressure is getting better. Continue Toprol-XL 50 mg home dose increased to  100 mg . Hydralazine 25 mg every 8 hours increased to every 6 hours. Cozaar dose increased from 50-to 100 mg once daily . Patient being elderly with other comorbidities goal is to maintain her systolic blood pressure at around 150-. HCTZ is added to the regimen Patient's blood pressure at the time of arrival was 213/126 and 240/126 Patient being elderly with other comorbidities are systolic blood pressure goal is 557-, diastolic  95   # Elevated troponin -non trending. Troponin less than 0.03, and 0.03   #. Pneumonia: Healthcare associated (the patient was recently discharged from skilled nursing facility for rehabilitation following back surgery); continue Levaquin,   vancomycin. Neg MRSA PCR discontinue vancomycin.  # Dysarthria: Slurred speech improved The patient also has dysdiadochokinesis.  CT head with no acute findings. MRI no acute changes. Dysarthria is assumed to be from  Metabolic issues .follow-up with neurology  Continue aspirin per atrial fibrillation   #Acute kidney injury and chronic kidney disease stage III : secondary to dehydration   Renal function improved with BUN at 25 and creatinine 1.05. Discontinue IV fluids encouraged by mouth intake Avoid nephrotoxic agents.Lactic acid elevation indicates dehydration in this case.  #. Hypothyroidism:  TSH 15.2, free T4 1.13 ,almost normal. Increased Synthroid to 75 g from home dose 50  #Sundowning with agitation-recommended to reorient the patient and if possible family members to stay with the patient overnight. Seroquel dose increased to 25 mg by mouth daily at bedtime for insomnia  # DVT prophylaxis: Heparin  #   GERD with regurgitation -GI prophylaxis: Pantoprazole per home regimen, Pepcid added    Disposition PT recommends skilled nursing facility, patient and family are agreeable for placement. Anticipate discharge in a.m.  All the records are reviewed and case discussed with Care Management/Social Workerr. Management plans discussed with the patient, family and they are in agreement.  CODE STATUS: FC  TOTAL TIME TAKING CARE OF THIS PATIENT: 35  minutes.   POSSIBLE D/C IN 1 DAYS, DEPENDING ON CLINICAL CONDITION.  Note: This dictation was prepared with Dragon dictation along with smaller phrase technology. Any transcriptional errors that result from this process are unintentional.   Nicholes Mango M.D on 12/21/2016 at 2:46 PM  Between 7am to 6pm - Pager - [phone] After 6pm go to [email or web address] - password [email or web address] [facility]  Office  [phone]  CC: Primary care physician; [name], MD

## 2016-12-21 NOTE — Care Management Important Message (Signed)
Important Message  Patient Details  Name: Janice Perkins MRN: [id number] Date of Birth: November 18, 1932   Medicare Important Message Given:  Yes    Shelbie Ammons, RN 12/21/2016, 11:37 AM

## 2016-12-21 NOTE — Clinical Social Work Note (Signed)
CSW presented bed offers to patient's husband, he chose [facility].  CSW spoke to [facility] and they said they have a semi-private room avaialable for tomorrow, CSW updated patient's husband, and he is in agreement to going to SNF tomorrow.  CSW to continue to follow patient's progress throughout discharge planning.  Jones Broom. Hurley, MSW, [place]  12/21/2016 1:34 PM

## 2016-12-21 NOTE — Progress Notes (Signed)
Speech Language Pathology Treatment: Dysphagia  Patient Details Name: Janice Perkins MRN: [id number] DOB: 1933-07-10 Today's Date: 12/21/2016 Time: 1448-1856 SLP Time Calculation (min) (ACUTE ONLY): 28 min  Assessment / Plan / Recommendation Clinical Impression  Pt seen for toleration of current Dysphagia level 2(minced meats, foods) w/ thin liquids diet - pt is unable to wear her bottom denture plate which impacts her ability to effectively masticate solids. Pt was somewhat agitated and perseverating on not discharging today as she had been told. She quickly switched topics at times; unsure of pt's baseline Cognitive status.  Pt consumed trials of thin liquids w/ SLP and appears at reduced risk for aspiration when following general aspiration precautions including drinking thin liquids using single sips and more slowly taking rest breaks - monitoring respiratory status and avoid becoming SOB w/ the exertion during oral intake. No immediate, overt s/s of aspiration noted; no change in vocal quality occurred w/ trials. Pt does not wear her bottom denture plate(ill-fitting). This can impact effective mastication of any increased textured foods. Pt exhibited min belching and does have a baseline of GERD for which she takes Nexium per her report. Pt does appear at min increased risk for aspiration of REFLUX material which can be more chronic and easily impact pulmonary status.  Recommend continue a Dysphagia level 2 w/ thin liquids - more broken down diet consistency d/t lacking bottom dentures, exertion/effort of mastication w/ solids, and Esophageal dysmotility baseline; aspiration and Reflux precautions. This was discussed w/ pt who agreed; also educated on taking rest breaks/pacing self and moistening foods well, mashing foods. ST services will be available for further education while admitted.      HPI HPI: Pt is a 83 y.o. female brought to the ED from home via EMS with a chief complaint of weakness,  shortness of breath and altered mental status. Patient was seen in the ED several days ago for same; found to have bronchitis and discharged home on prednisone and doxycycline. Patient wears 2 L oxygen at night and states this evening she experienced coughing with shortness of breath and "dry mouth". She tells me that one of her blood pressure medicines was recently decreased. States she has been taking her medicines as directed. Denies fever, chills, chest pain, abdominal pain, nausea, vomiting, diarrhea. Denies recent travel or trauma. Exertion makes her symptoms worse. Pt denied any difficulty swallowing but stated she does get SOB easily w/ exertion. NSG indicated pt had not received her swallow screen in the ED; currently NPO. Pt is verbally conversive but is easily distracted and requires verbal cues for redirection and follow through intermittently - noted head CT results indicating moderate generalized atrophy and chronic appearing white matter hypodensities. Noted previous CXRs indicating Mild chronic bronchitic changes without acute infiltrate - pt stated she has "bronchitis" currently. Pt endorsed s/s of REFLUX and stated she takes Nexium at home, "I'm suppose to". Pt stated she has been eating and drinking "fine" w/ no reported overt s/s of aspiration noted by pt or Staff.       SLP Plan  Continue with current plan of care       Recommendations  Diet recommendations: Dysphagia 2 (fine chop);Thin liquid (lacking bottom dentition; weak overall ) Liquids provided via: Cup (monitor any straw use) Medication Administration: Whole meds with puree (or Crushed if easier for swallowing overall) Supervision: Patient able to self feed;Intermittent supervision to cue for compensatory strategies Compensations: Minimize environmental distractions;Slow rate;Small sips/bites;Lingual sweep for clearance of pocketing;Follow solids with  liquid Postural Changes and/or Swallow Maneuvers: Seated upright 90  degrees;Upright 30-60 min after meal (Reflux precautions (baseline))                General recommendations:  (Dietician f/u; GI f/u for ongoing Reflux management) Oral Care Recommendations: Oral care BID;Patient independent with oral care;Staff/trained caregiver to provide oral care Follow up Recommendations: None (TBD - if pt is able to obtain bottom dentition) SLP Visit Diagnosis: Dysphagia, oral phase (R13.11);Dysphagia, pharyngoesophageal phase (R13.14) Plan: Continue with current plan of care       Toombs, Wilsey, CCC-SLP Watson,Katherine 12/21/2016, 3:35 PM

## 2016-12-21 NOTE — Progress Notes (Signed)
Occupational Therapy Treatment Patient Details Name: Janice Perkins MRN: [id number] DOB: July 07, 1933 Today's Date: 12/21/2016    History of present illness Pt is a 83 y.o. female brought to the ED from home via EMS with a chief complaint of weakness, shortness of breath and altered mental status. Patient was seen in the ED several days ago for same; found to have bronchitis and discharged home on prednisone and doxycycline. Patient wears 2 L oxygen at night and states this evening she experienced coughing with shortness of breath and "dry mouth".  She was admitted to [facility] and diagnosed with pneumonia.  She reports she had a recent back surgery in March of this year and went for rehab at discharge. She had been home for about 4 weeks and had a care aide a couple days a week.    OT comments  Pt seen for OT treatment session this date. Pt asleep upon OT's entry into room, spouse present but stepped out to encourage pt to participate in session. Pt lethargic, spouse reports pt just had bath recently and was fatigued. Pt opened eyes infrequently, appropriate in conversation with therapist. Pt encouraged and educated in benefits of skilled OT services to support her goal of returning home as quickly as possible, however declined multiple attempts to encourage participation in functional mobility and activities seated EOB. Pt educated in energy conservation strategies and benefits of activity to minimize decline in activity tolerance and muscle strength. Pt verbalized understanding of all education provided, still declining mobility/ADL tasks at this time. Will continue to assess progress during next session. Pt continues to benefit from skilled OT services to address noted impairments and functional deficits in order to maximize return to PLOF.    Follow Up Recommendations  SNF    Equipment Recommendations  Other (comment) (defer to next venue of care)    Recommendations for Other Services       Precautions / Restrictions Precautions Precautions: Fall Restrictions Weight Bearing Restrictions: No       Mobility Bed Mobility               General bed mobility comments: pt declined due to fatigue this date  Transfers                 General transfer comment: pt declined due to fatigue this date    Balance Overall balance assessment: History of Falls                                         ADL either performed or assessed with clinical judgement   ADL                                               Vision       Perception     Praxis      Cognition Arousal/Alertness: Lethargic Behavior During Therapy: WFL for tasks assessed/performed Overall Cognitive Status: Within Functional Limits for tasks assessed                                 General Comments: Pt very fatigued after bath. Pt agreeable to speaking with therapist, but did not open eyes during session and declined to  perform bed mobility or light tasks seated EOB despite encouragement        Exercises Other Exercises Other Exercises: Pt educated briefly in energy conservation strategies and strategies to minimize further decline in energy levels and fatigue, pt verbalized understanding   Shoulder Instructions       General Comments      Pertinent Vitals/ Pain       Pain Assessment: No/denies pain  Home Living                                          Prior Functioning/Environment              Frequency  Min 1X/week        Progress Toward Goals  OT Goals(current goals can now be found in the care plan section)  Progress towards OT goals: OT to reassess next treatment  Acute Rehab OT Goals Patient Stated Goal: Patient wants to walk again and be able to do more for herself.  OT Goal Formulation: With patient Time For Goal Achievement: 12/26/16 Potential to Achieve Goals: Good  Plan Discharge plan  remains appropriate;Frequency remains appropriate    Co-evaluation                 AM-PAC PT "6 Clicks" Daily Activity     Outcome Measure   Help from another person eating meals?: None Help from another person taking care of personal grooming?: A Little Help from another person toileting, which includes using toliet, bedpan, or urinal?: A Lot Help from another person bathing (including washing, rinsing, drying)?: A Lot Help from another person to put on and taking off regular upper body clothing?: A Little Help from another person to put on and taking off regular lower body clothing?: A Lot 6 Click Score: 16    End of Session Equipment Utilized During Treatment: Oxygen  OT Visit Diagnosis: Muscle weakness (generalized) (M62.81)   Activity Tolerance Patient limited by fatigue   Patient Left in bed;with call bell/phone within reach;with bed alarm set   Nurse Communication          Time: 5465-6812 OT Time Calculation (min): 14 min  Charges: OT General Charges $OT Visit: 1 Procedure OT Treatments $Therapeutic Activity: 8-22 mins  Jeni Salles, MPH, MS, OTR/L ascom [phone] 12/21/16, 11:03 AM

## 2016-12-22 ENCOUNTER — Encounter
Admission: RE | Admit: 2016-12-22 | Discharge: 2016-12-22 | Disposition: A | Discharge status: Home / Self Care | Payer: Medicare Other | Source: Ambulatory Visit | Attending: Internal Medicine | Admitting: Internal Medicine

## 2016-12-22 LAB — BASIC METABOLIC PANEL
Anion gap: 10 (ref 5–15)
BUN: 18 mg/dL (ref 6–20)
CALCIUM: 9.5 mg/dL (ref 8.9–10.3)
CO2: 27 mmol/L (ref 22–32)
CREATININE: 0.83 mg/dL (ref 0.44–1.00)
Chloride: 101 mmol/L (ref 101–111)
GFR calc non Af Amer: >60 mL/min (ref >60)
Glucose, Bld: 105 mg/dL — ABNORMAL HIGH (ref 65–99)
Potassium: 4.3 mmol/L (ref 3.5–5.1)
Sodium: 138 mmol/L (ref 135–145)

## 2016-12-22 MED ORDER — HYDRALAZINE HCL 25 MG PO TABS: 25.0000 mg | ORAL_TABLET | Freq: Four times a day (QID) | ORAL | Status: DC

## 2016-12-22 MED ORDER — HYDROCHLOROTHIAZIDE 25 MG PO TABS: 25.0000 mg | ORAL_TABLET | Freq: Every day | ORAL | Status: DC

## 2016-12-22 MED ORDER — LEVOFLOXACIN 750 MG PO TABS
750.0000 mg | ORAL_TABLET | Freq: Every day | ORAL | Status: DC
  Administered 2016-12-22: 750 mg via ORAL
  Filled 2016-12-22: qty 1

## 2016-12-22 MED ORDER — DOCUSATE SODIUM 100 MG PO CAPS: 100.0000 mg | ORAL_CAPSULE | Freq: Every day | ORAL | 0 refills | Status: DC

## 2016-12-22 MED ORDER — ACETAMINOPHEN 325 MG PO TABS: 650.0000 mg | ORAL_TABLET | Freq: Four times a day (QID) | ORAL | Status: DC | PRN

## 2016-12-22 MED ORDER — LOSARTAN POTASSIUM 100 MG PO TABS: 100.0000 mg | ORAL_TABLET | Freq: Every day | ORAL | Status: DC

## 2016-12-22 MED ORDER — ORAL CARE MOUTH RINSE: 15.0000 mL | Freq: Two times a day (BID) | OROMUCOSAL | 0 refills | Status: DC

## 2016-12-22 MED ORDER — QUETIAPINE FUMARATE 25 MG PO TABS: 25.0000 mg | ORAL_TABLET | Freq: Every day | ORAL | Status: DC

## 2016-12-22 MED ORDER — ALPRAZOLAM 0.25 MG PO TABS: 0.2500 mg | ORAL_TABLET | Freq: Three times a day (TID) | ORAL | 0 refills | Status: DC | PRN

## 2016-12-22 MED ORDER — LEVOFLOXACIN 750 MG PO TABS: 750.0000 mg | ORAL_TABLET | Freq: Every day | ORAL | 0 refills | Status: DC

## 2016-12-22 MED ORDER — HYDRALAZINE HCL 50 MG PO TABS: 25.0000 mg | ORAL_TABLET | Freq: Once | ORAL | Status: DC

## 2016-12-22 MED ORDER — ONDANSETRON HCL 4 MG PO TABS: 4.0000 mg | ORAL_TABLET | Freq: Four times a day (QID) | ORAL | 0 refills | Status: DC | PRN

## 2016-12-22 MED ORDER — HYDROCODONE-ACETAMINOPHEN 5-325 MG PO TABS: 1.0000 | ORAL_TABLET | Freq: Four times a day (QID) | ORAL | 0 refills | Status: DC | PRN

## 2016-12-22 MED ORDER — ENSURE ENLIVE PO LIQD: 237.0000 mL | Freq: Three times a day (TID) | ORAL | 12 refills | Status: DC

## 2016-12-22 MED ORDER — METOPROLOL SUCCINATE ER 100 MG PO TB24: 100.0000 mg | ORAL_TABLET | Freq: Two times a day (BID) | ORAL | Status: DC

## 2016-12-22 MED ORDER — LEVOTHYROXINE SODIUM 75 MCG PO TABS: 75.0000 ug | ORAL_TABLET | Freq: Every day | ORAL | Status: DC

## 2016-12-22 NOTE — Progress Notes (Signed)
PHARMACY NOTE:  ANTIMICROBIAL RENAL DOSAGE ADJUSTMENT  Current antimicrobial regimen includes a mismatch between antimicrobial dosage and estimated renal function.  As per policy approved by the Pharmacy & Therapeutics and Medical Executive Committees, the antimicrobial dosage will be adjusted accordingly.  Current antimicrobial dosage:  Levaquin 750mg  PO q48h  Indication: pneumonia  Renal Function: est. CrCl 52 ml/min  Estimated Creatinine Clearance: 52 mL/min (by C-G formula based on SCr of 0.83 mg/dL). []      On intermittent HD, scheduled: []      On CRRT    Antimicrobial dosage has been changed to:  Levaquin 750mg  Po daily  Additional comments:   Thank you for allowing pharmacy to be a part of this patient's care.  Paulina Fusi, PharmD, BCPS 12/22/2016 10:47 AM

## 2016-12-22 NOTE — Discharge Summary (Signed)
[facility] [place] NAME: Janice Perkins    MR#:  [id number]  DATE OF BIRTH:  1933/07/06  DATE OF ADMISSION:  12/17/2016 ADMITTING PHYSICIAN: Harrie Foreman, MD  DATE OF DISCHARGE: 12/22/16 PRIMARY CARE PHYSICIAN: [name], MD    ADMISSION DIAGNOSIS:  Hypertensive encephalopathy [I67.4] Lumbar radiculopathy [M54.16] Postherpetic neuralgia [B02.29] SOB (shortness of breath) [R06.02] DDD (degenerative disc disease), lumbar [M51.36] Sacroiliac joint dysfunction [M53.3] Chronic right shoulder pain [M25.511, G89.29] Bursitis of right shoulder [M75.51] Lumbosacral facet joint syndrome (HCC) [M46.97] Primary osteoarthritis of right shoulder [M19.011] Bilateral occipital neuralgia [M54.81] Lumbar compression fracture, closed, initial encounter ([place]) [S32.000A] Greater trochanteric bursitis, unspecified laterality [M70.60] Long-term use of aspirin therapy [Z79.82] Community acquired pneumonia of right middle lobe of lung ([place]) [J18.1] Community acquired pneumonia of right lung, unspecified part of lung [J18.9]  DISCHARGE DIAGNOSIS:  Active Problems:   AKI (acute kidney injury) ([place]) HTN   SECONDARY DIAGNOSIS:   Past Medical History:  Diagnosis Date  . Asthma   . Breast cancer ([place]) 2011  . Bronchitis 04/2015  . Cancer [facility]) 2011   breast  . COPD (chronic obstructive pulmonary disease) ([place])   . Hypertension   . Shingles    10/2015    HOSPITAL COURSE:   HPI: The patient with past medical history of hypertension and COPD presents to the emergency department complaining of shortness of breath. The patient has seen her primary care physician as well as undergone ED evaluation in the last 5 days for cough and shortness of breath. The cough has been nonproductive. Workup was unremarkable in the emergency department and the patient was diagnosed with chronic bronchitis. Earlier in his course of illness the patient was taken to  her primary care doctor for the same but also to be evaluated for slurred speech and memory loss both of these findings were acute at the time. Her PCP ordered an MRI which is scheduled for today. During her last ED visit CT of her head showed no indication of stroke but her blood pressure was very elevated at that time. He continues to be elevated today. The patient was given multiple doses of hydralazine in the emergency department as well as as nitroglycerin paste. Imaging of her chest today also revealed pneumonia. Antibiotics were initiated in the emergency department prior to the hospitalist service being called for admission.  #Encephalopathy with possible underlying dementia-secondary to pneumonia and acute kidney injury Clinically improved with IV antibiotics and IV fluids. Not completely at her baseline, concern for underlying dementia Need further evaluation as an outpatient after completing the antibiotics. Patient will be benefited with outpatient neurology follow-up in one month. PCP to refer the patient to outpatient neurology  #. Hypertensive urgency: Blood pressure is getting better. Continue Toprol-XL 50 mg home dose increased to  100 mg . Hydralazine 25 mg every 8 hours increased to every 6 hours. Cozaar dose increased from 50-to 100 mg once daily . Patient being elderly with other comorbidities goal is to maintain her systolic blood pressure at around 150-. HCTZ is added to the regimen Patient's blood pressure at the time of arrival was 213/126 and 240/126 Patient being elderly with other comorbidities are systolic blood pressure goal is 778-, diastolic 95   # Elevated troponin -non trending. Troponin less than 0.03, and 0.03   #. Pneumonia: Healthcare associated (the patient was recently discharged from skilled nursing facility for rehabilitation following back surgery); continue Levaquin,  vancomycin. Neg MRSA  PCR discontinue vancomycin Discharge patient  with Levaquin.  #  Dysarthria: Slurred speech improved The patient also has dysdiadochokinesis.  CT head with no acute findings. MRI no acute changes. Dysarthria is assumed to be from  Metabolic issues .follow-up with neurology  Continue aspirin per atrial fibrillation   #Acute kidney injury and chronic kidney disease stage III : secondary to dehydration   Renal function improved with BUN at 25 and creatinine 1.05--0.83. Discontinued IV fluids encouraged by mouth intake Avoid nephrotoxic agents.Lactic acid elevation indicates dehydration in this case.  #. Hypothyroidism:  TSH 15.2, free T4 1.13 ,almost normal. Increased Synthroid to 75 g from home dose 50 PCP to repeat thyroid function tests in  8 weeks  #Sundowning with agitation-recommended to reorient the patient and if possible family members to stay with the patient overnight. Seroquel dose increased to 25 mg by mouth daily at bedtime for insomnia  # DVT prophylaxis: Heparin  #   GERD with regurgitation -GI prophylaxis: Pantoprazole per home regimen, Pepcid added    DISCHARGE CONDITIONS:   fair  CONSULTS OBTAINED:  Treatment Team:  Alexis Goodell, MD   PROCEDURES  None   DRUG ALLERGIES:   Allergies  Allergen Reactions  . Penicillins Hives  . Captopril Other (See Comments)  . Enalapril Maleate     Other reaction(s): Unknown  . Seldane  [Terfenadine] Other (See Comments)  . Tape Itching  . Augmentin [Amoxicillin-Pot Clavulanate] Rash  . Biaxin [Clarithromycin] Rash, Other (See Comments) and Hives    Other reaction(s): Unknown    DISCHARGE MEDICATIONS:   Current Discharge Medication List    START taking these medications   Details  acetaminophen (TYLENOL) 325 MG tablet Take 2 tablets (650 mg total) by mouth every 6 (six) hours as needed for mild pain (or Fever >/= 101).    ALPRAZolam (XANAX) 0.25 MG tablet Take 1 tablet (0.25 mg total) by mouth 3 (three) times daily as needed for anxiety. Qty: 15 tablet, Refills: 0     docusate sodium (COLACE) 100 MG capsule Take 1 capsule (100 mg total) by mouth daily. Qty: 10 capsule, Refills: 0    feeding supplement, ENSURE ENLIVE, (ENSURE ENLIVE) LIQD Take 237 mLs by mouth 3 (three) times daily with meals. Qty: 237 mL, Refills: 12    hydrALAZINE (APRESOLINE) 25 MG tablet Take 1 tablet (25 mg total) by mouth every 6 (six) hours.    hydrochlorothiazide (HYDRODIURIL) 25 MG tablet Take 1 tablet (25 mg total) by mouth daily.    levofloxacin (LEVAQUIN) 750 MG tablet Take 1 tablet (750 mg total) by mouth daily. Qty: 5 tablet, Refills: 0    mouth rinse LIQD solution 15 mLs by Mouth Rinse route 2 (two) times daily. Refills: 0    ondansetron (ZOFRAN) 4 MG tablet Take 1 tablet (4 mg total) by mouth every 6 (six) hours as needed for nausea. Qty: 20 tablet, Refills: 0    QUEtiapine (SEROQUEL) 25 MG tablet Take 1 tablet (25 mg total) by mouth at bedtime.      CONTINUE these medications which have CHANGED   Details  HYDROcodone-acetaminophen (NORCO/VICODIN) 5-325 MG tablet Take 1 tablet by mouth every 6 (six) hours as needed for severe pain. Qty: 30 tablet, Refills: 0    levothyroxine (SYNTHROID, LEVOTHROID) 75 MCG tablet Take 1 tablet (75 mcg total) by mouth daily before breakfast.    losartan (COZAAR) 100 MG tablet Take 1 tablet (100 mg total) by mouth daily.    metoprolol succinate (TOPROL-XL) 100 MG 24  hr tablet Take 1 tablet (100 mg total) by mouth 2 (two) times daily with a meal. Take with or immediately following a meal.      CONTINUE these medications which have NOT CHANGED   Details  alendronate (FOSAMAX) 70 MG tablet Take 70 mg by mouth once a week. Take with a full glass of water on an empty stomach.    aspirin 81 MG tablet Take 81 mg by mouth 2 (two) times daily.     atorvastatin (LIPITOR) 20 MG tablet Take 20 mg by mouth daily.    cholecalciferol (VITAMIN D) 1000 units tablet Take 1,000 Units by mouth daily.    esomeprazole (NEXIUM) 40 MG capsule  Take 40 mg by mouth daily at 12 noon.    fexofenadine (ALLEGRA) 180 MG tablet Take 180 mg by mouth daily.    FLUoxetine (PROZAC) 20 MG capsule Take 20 mg by mouth 2 (two) times daily.    fluticasone (FLONASE) 50 MCG/ACT nasal spray Place into both nostrils daily.    Fluticasone-Salmeterol (ADVAIR) 100-50 MCG/DOSE AEPB Inhale 1 puff into the lungs 2 (two) times daily.    ipratropium (ATROVENT) 0.02 % nebulizer solution Take 0.5 mg by nebulization 4 (four) times daily as needed.     magnesium oxide (MAG-OX) 400 MG tablet Take 400 mg by mouth 2 (two) times daily.    meloxicam (MOBIC) 15 MG tablet Take 15 mg by mouth as needed for pain.      STOP taking these medications     clorazepate (TRANXENE) 7.5 MG tablet      doxycycline (VIBRAMYCIN) 50 MG capsule      predniSONE (DELTASONE) 10 MG tablet      rOPINIRole (REQUIP) 0.5 MG tablet          DISCHARGE INSTRUCTIONS:   Follow-up with primary care physician at the facility [facility] in 3-4 days. PCP to consider referring the patient to outpatient neurology in 3-4 weeks  DIET:  Low saod  DISCHARGE CONDITION:  Fair  ACTIVITY:  Activity as tolerated  OXYGEN:  Home Oxygen: yes   Oxygen Delivery: 2 L daily at bedtime  DISCHARGE LOCATION:  nursing home   If you experience worsening of your admission symptoms, develop shortness of breath, life threatening emergency, suicidal or homicidal thoughts you must seek medical attention immediately by calling 911 or calling your MD immediately  if symptoms less severe.  You Must read complete instructions/literature along with all the possible adverse reactions/side effects for all the Medicines you take and that have been prescribed to you. Take any new Medicines after you have completely understood and accpet all the possible adverse reactions/side effects.   Please note  You were cared for by a hospitalist during your hospital stay. If you have any questions about your  discharge medications or the care you received while you were in the hospital after you are discharged, you can call the unit and asked to speak with the hospitalist on call if the hospitalist that took care of you is not available. Once you are discharged, your primary care physician will handle any further medical issues. Please note that NO REFILLS for any discharge medications will be authorized once you are discharged, as it is imperative that you return to your primary care physician (or establish a relationship with a primary care physician if you do not have one) for your aftercare needs so that they can reassess your need for medications and monitor your lab values.     Today  Chief Complaint  Patient presents with  . Shortness of Breath   Patient is feeling fine. Wants to go home, still confused intermittently  ROS: Unobtainable as the patient is altered   VITAL SIGNS:  Blood pressure (!) 148/86, pulse 71, temperature 98.4 F (36.9 C), temperature source Oral, resp. rate 18, height 5\' 1"  (1.549 m), weight 85.7 kg (189 lb), SpO2 97 %.  I/O:    Intake/Output Summary (Last 24 hours) at 12/22/16 1402 Last data filed at 12/22/16 1353  Gross per 24 hour  Intake              240 ml  Output             1200 ml  Net             -960 ml    PHYSICAL EXAMINATION:  GENERAL:  83 y.o.-year-old patient lying in the bed with no acute distress.  EYES: Pupils equal, round, reactive to light and accommodation. No scleral icterus. Extraocular muscles intact.  HEENT: Head atraumatic, normocephalic. Oropharynx and nasopharynx clear.  NECK:  Supple, no jugular venous distention. No thyroid enlargement, no tenderness.  LUNGS: Normal breath sounds bilaterally, no wheezing, rales,rhonchi or crepitation. No use of accessory muscles of respiration.  CARDIOVASCULAR: S1, S2 normal. No murmurs, rubs, or gallops.  ABDOMEN: Soft, non-tender, non-distended. Bowel sounds present. No organomegaly or mass.   EXTREMITIES: No pedal edema, cyanosis, or clubbing.  NEUROLOGIC: Cranial nerves II through XII are intact. Muscle strength 5/5 in all extremities. Sensation intact. Gait not checked.  PSYCHIATRIC: The patient is alert and oriented x 3.  SKIN: No obvious rash, lesion, or ulcer.   DATA REVIEW:   CBC  Recent Labs Lab 12/20/16 0444  WBC 9.2  HGB 12.0  HCT 36.1  PLT 249    Chemistries   Recent Labs Lab 12/18/16 0005 12/20/16 0444  12/22/16 0610  NA 136 137  < > 138  K 4.6 3.0*  < > 4.3  CL 101 103  < > 101  CO2 24 27  < > 27  GLUCOSE 167* 109*  < > 105*  BUN 25* 25*  < > 18  CREATININE 1.17* 1.05*  < > 0.83  CALCIUM 9.1 8.6*  < > 9.5  MG  --  1.7  --   --   AST 54*  --   --   --   ALT 30  --   --   --   ALKPHOS 85  --   --   --   BILITOT 1.1  --   --   --   < > = values in this interval not displayed.  Cardiac Enzymes  Recent Labs Lab 12/18/16 0005  TROPONINI 0.03*    Microbiology Results  Results for orders placed or performed during the hospital encounter of 12/17/16  Culture, blood (routine x 2)     Status: None (Preliminary result)   Collection Time: 12/18/16 12:47 AM  Result Value Ref Range Status   Specimen Description BLOOD RT ARM  Final   Special Requests Blood Culture adequate volume  Final   Culture NO GROWTH 4 DAYS  Final   Report Status PENDING  Incomplete  Culture, blood (routine x 2)     Status: None (Preliminary result)   Collection Time: 12/18/16 12:48 AM  Result Value Ref Range Status   Specimen Description BLOOD RT ARM  Final   Special Requests Blood Culture adequate volume  Final   Culture  NO GROWTH 4 DAYS  Final   Report Status PENDING  Incomplete  MRSA PCR Screening     Status: None   Collection Time: 12/18/16  6:16 AM  Result Value Ref Range Status   MRSA by PCR NEGATIVE NEGATIVE Final    Comment:        The GeneXpert MRSA Assay (FDA approved for NASAL specimens only), is one component of a comprehensive MRSA  colonization surveillance program. It is not intended to diagnose MRSA infection nor to guide or monitor treatment for MRSA infections.     RADIOLOGY:  Dg Chest Port 1 View  Result Date: 12/19/2016 CLINICAL DATA:  Hypertension. Hx of asthma, breast cancer, bronchitis, COPD. Nonsmoker. Right breast biopsy. EXAM: PORTABLE CHEST 1 VIEW COMPARISON:  12/17/2016 FINDINGS: Mildly degraded exam due to AP portable technique and patient body habitus. Midline trachea. Cardiomegaly accentuated by AP portable technique. Numerous leads and wires project over the chest. Atherosclerosis in the transverse aorta. Layering right pleural effusion is similar. No pneumothorax. Right-sided interstitial and airspace disease is lower lobe predominant. IMPRESSION: Given differences in technique, similar right pleural fluid with asymmetric pulmonary edema versus pneumonia. Cardiomegaly and aortic atherosclerosis. Electronically Signed   By: Abigail Miyamoto M.D.   On: 12/19/2016 14:27    EKG:   Orders placed or performed during the hospital encounter of 12/17/16  . EKG 12-Lead  . EKG 12-Lead  . ED EKG  . ED EKG  . EKG 12-Lead  . EKG 12-Lead      Management plans discussed with the patient, family and they are in agreement.  CODE STATUS:     Code Status Orders        Start     Ordered   12/18/16 0718  Full code  Continuous     12/18/16 0718    Code Status History    Date Active Date Inactive Code Status Order ID Comments User Context   This patient has a current code status but no historical code status.    Advance Directive Documentation     Most Recent Value  Type of Advance Directive  Healthcare Power of Attorney, Living will  Pre-existing out of facility DNR order (yellow form or pink MOST form)  -  "MOST" Form in Place?  -      TOTAL TIME TAKING CARE OF THIS PATIENT: 45  minutes.   Note: This dictation was prepared with Dragon dictation along with smaller phrase technology. Any  transcriptional errors that result from this process are unintentional.   @[facility] @  on 12/22/2016 at 2:02 PM  Between 7am to 6pm - Pager - [phone]  After 6pm go to [email or web address] - password [email or web address] [facility]  Office  [phone]  CC: Primary care physician; [name], MD

## 2016-12-22 NOTE — Progress Notes (Signed)
Physical Therapy Treatment Patient Details Name: Janice Perkins MRN: [id number] DOB: 09/17/1932 Today's Date: 12/22/2016    History of Present Illness Pt is a 83 y.o. female brought to the ED from home via EMS with a chief complaint of weakness, shortness of breath and altered mental status. Patient was seen in the ED several days ago for same; found to have bronchitis and discharged home on prednisone and doxycycline. Patient wears 2 L oxygen at night and states this evening she experienced coughing with shortness of breath and "dry mouth".  She was admitted to [facility] and diagnosed with pneumonia.  She reports she had a recent back surgery in March of this year and went for rehab at discharge. She had been home for about 4 weeks and had a care aide a couple days a week.     PT Comments    Pt is making good progress towards goals, however is limited secondary to fatigue and remains sleepy this date. Pt reports she needs to use bathroom. Needs +2 assist, however able to transfer to [facility] using RW. Pt able to perform supine there-ex with good endurance and demonstrates improved strength from previous session. Will continue to progress.   Follow Up Recommendations  SNF     Equipment Recommendations  None recommended by PT    Recommendations for Other Services       Precautions / Restrictions Precautions Precautions: Fall Restrictions Weight Bearing Restrictions: No    Mobility  Bed Mobility Overal bed mobility: Needs Assistance Bed Mobility: Supine to Sit     Supine to sit: Min assist;+2 for safety/equipment     General bed mobility comments: needs assist for B LEs and complains of back pain once seated at EOB. Pt sleepy, however able to follow commands.  Transfers Overall transfer level: Needs assistance Equipment used: Rolling walker (2 wheeled) Transfers: Sit to/from Stand Sit to Stand: +2 safety/equipment;+2 physical assistance;Min assist         General transfer comment:  assist for standing at EOB. RW used for assist  Ambulation/Gait Ambulation/Gait assistance: Min Wellsite geologist (Feet): 3 Feet Assistive device: Rolling walker (2 wheeled) Gait Pattern/deviations: Step-to pattern     General Gait Details: able to ambulate with +1 to [facility] and then back to bed. Heavy cues for sequencing given. Slow step to gait pattern noted.   Stairs            Wheelchair Mobility    Modified Rankin (Stroke Patients Only)       Balance                                            Cognition Arousal/Alertness:  (sleepy) Behavior During Therapy: WFL for tasks assessed/performed Overall Cognitive Status: Within Functional Limits for tasks assessed                                        Exercises Other Exercises Other Exercises: supine ther-ex performed including 12 reps of ankle pumps, quad sets, hip abd/add, heel slides, SAQ, and hip add squeezes. All ther-ex performed with min assist and cues to stay awake as pt still sleepy Other Exercises: assist for using BSC for urine and BM. Needs +2 assist (1 for standing and 1 for hygiene).     General  Comments        Pertinent Vitals/Pain Pain Assessment: Faces Faces Pain Scale: Hurts little more Pain Location: low back Pain Descriptors / Indicators: Discomfort;Dull Pain Intervention(s): Limited activity within patient's tolerance    Home Living                      Prior Function            PT Goals (current goals can now be found in the care plan section) Acute Rehab PT Goals Patient Stated Goal: to use the bathroom PT Goal Formulation: With patient Time For Goal Achievement: 01/01/17 Potential to Achieve Goals: Good Progress towards PT goals: Progressing toward goals    Frequency    Min 2X/week      PT Plan Current plan remains appropriate    Co-evaluation              AM-PAC PT "6 Clicks" Daily Activity  Outcome Measure   Difficulty turning over in bed (including adjusting bedclothes, sheets and blankets)?: Total Difficulty moving from lying on back to sitting on the side of the bed? : Total Difficulty sitting down on and standing up from a chair with arms (e.g., wheelchair, bedside commode, etc,.)?: Total Help needed moving to and from a bed to chair (including a wheelchair)?: A Lot Help needed walking in hospital room?: A Lot Help needed climbing 3-5 steps with a railing? : A Lot 6 Click Score: 9    End of Session Equipment Utilized During Treatment: Gait belt Activity Tolerance: Patient limited by fatigue Patient left: in bed;with bed alarm set Nurse Communication: Mobility status PT Visit Diagnosis: Muscle weakness (generalized) (M62.81);History of falling (Z91.81);Difficulty in walking, not elsewhere classified (R26.2)     Time: 1010-1038 PT Time Calculation (min) (ACUTE ONLY): 28 min  Charges:  $Therapeutic Exercise: 8-22 mins $Therapeutic Activity: 8-22 mins                    G CodesGreggory Stallion, PT, DPT [phone]    Evanna Washinton 12/22/2016, 12:00 PM

## 2016-12-22 NOTE — Clinical Social Work Note (Signed)
Patient to be d/c'ed today to [facility].  Patient and family agreeable to plans will transport via ems RN to call report to room 210A [phone].  Evette Cristal, MSW, [place]

## 2016-12-22 NOTE — Progress Notes (Signed)
Nutrition Follow-up  DOCUMENTATION CODES:   Obesity unspecified  INTERVENTION:  Continue Ensure Enlive po TID with meals, each supplement provides 350 kcal and 20 grams of protein. Discussed importance of drinking an ONS when PO intake inadequate to prevent unintentional wt loss/loss of lean body mass.  NUTRITION DIAGNOSIS:   Inadequate oral intake related to poor appetite as evidenced by per patient/family report.  Ongoing.  GOAL:   Patient will meet greater than or equal to 90% of their needs  Progressing.  MONITOR:   PO intake, Supplement acceptance, Labs, Weight trends, I & O's  REASON FOR ASSESSMENT:   Consult (Verbal consult) Poor PO  ASSESSMENT:   83 year old female with PMHx of breast cancer in 2011 s/p lumpectomy and XRT, HTN, COPD, shingles 10/2015 presented with SOB found to have encephalopathy with possible underlying dementia, PNA, AKI, dysarthria.  -Patient now with discharge orders to [facility].  Spoke with patient and husband at bedside. Patient's appetite is still poor. She is not eating much on trays. Husband reports she had a little bit of dinner last night and breakfast this morning, but did not eat anything for lunch today. She enjoys the [employer] that have been coming on trays. Patient did not receiving any Ensure today, cannot remember if she has tried it yet. Husband reports patient enjoys Ensure.   Meal Completion: 20-25%  Medications reviewed and include: vitamin D 1000 units daily, Colace, famotidine, levothyroxine, pantoprazole, Miralax, potassium chloride 40 mEq BID, vancomycin.  Labs reviewed and none pertinent.  Diet Order:  DIET DYS 2 Room service appropriate? Yes with Assist; Fluid consistency: Thin  Skin:  Reviewed, no issues  Last BM:  12/22/2016 - type 4 per chart  Height:   Ht Readings from Last 1 Encounters:  12/18/16 5\' 1"  (1.549 m)    Weight:   Wt Readings from Last 1 Encounters:  12/22/16 189 lb (85.7 kg)     Ideal Body Weight:  47.7 kg  BMI:  Body mass index is 35.71 kg/m.  Estimated Nutritional Needs:   Kcal:  1495-1745 (MSJ x 1.2-1.4)  Protein:  75-90 grams (0.9-1.1 grams/kg, 20% estimated kcal needs)  Fluid:  1.5-1.7 L/day  EDUCATION NEEDS:   No education needs identified at this time  Willey Blade, MS, RD, LDN Pager: [phone] After Hours Pager: [phone]

## 2016-12-22 NOTE — Discharge Instructions (Signed)
Follow-up with primary care physician at the facility [facility] in 3-4 days. PCP to consider referring the patient to outpatient neurology in 3-4 weeks

## 2016-12-23 ENCOUNTER — Other Ambulatory Visit: Payer: Medicare Other

## 2016-12-23 LAB — CULTURE, BLOOD (ROUTINE X 2)
CULTURE: NO GROWTH
Culture: NO GROWTH
SPECIAL REQUESTS: ADEQUATE
SPECIAL REQUESTS: ADEQUATE

## 2016-12-25 ENCOUNTER — Ambulatory Visit: Payer: Medicare Other

## 2017-01-01 ENCOUNTER — Non-Acute Institutional Stay (SKILLED_NURSING_FACILITY): Payer: Medicare Other | Admitting: Gerontology

## 2017-01-01 DIAGNOSIS — I10 Essential (primary) hypertension: Secondary | ICD-10-CM

## 2017-01-01 DIAGNOSIS — N179 Acute kidney failure, unspecified: Secondary | ICD-10-CM

## 2017-01-03 NOTE — Progress Notes (Signed)
Location:      Place of Service:  SNF (31)  Provider: Toni Arthurs, NP-C  PCP: [name], MD Patient Care Team: [name], MD as PCP - General (Internal Medicine)  Extended Emergency Contact Information Primary Emergency Contact: Saintvil,James Address: [address]          [place], [place] [address] Johnnette Litter of [place] Phone: [phone] Mobile Phone: [phone] Relation: Spouse Secondary Emergency Contact: Taylor,Jonita L Address: [address]          [place], [place] [id number] [place] of [place] Phone: [phone] Relation: Daughter  Code Status: DNR Goals of care:  Advanced Directive information Advanced Directives 12/18/2016  Does Patient Have a Medical Advance Directive? Yes  Type of Paramedic of [place];Living will  Does patient want to make changes to medical advance directive? No - Patient declined  Copy of [place] in Chart? No - copy requested  Would patient like information on creating a medical advance directive? No - Patient declined     Allergies  Allergen Reactions  . Penicillins Hives  . Captopril Other (See Comments)  . Enalapril Maleate     Other reaction(s): Unknown  . Seldane  [Terfenadine] Other (See Comments)  . Tape Itching  . Augmentin [Amoxicillin-Pot Clavulanate] Rash  . Biaxin [Clarithromycin] Rash, Other (See Comments) and Hives    Other reaction(s): Unknown    Chief Complaint  Patient presents with  . Discharge Note    HPI:  83 y.o. female seen today for discharge evaluation. Pt was admitted to the facility for rehab following hospitalization with HTN and AKI. Pt has been participating with PT/OT. SLP has been working with her as well d/t her dementia. She will continue to be followed by SLP at home after discharge for dementia. She denies pain, reports she is feeling well. Pt denies n/v/d/f/c/cp/sob/ha/abd pain/dizziness. Pt reports her appetite is good,  she is having regular BMs. VSS. No other complaints.     Past Medical History:  Diagnosis Date  . Asthma   . Breast cancer ([place]) 2011  . Bronchitis 04/2015  . Cancer [facility]) 2011   breast  . COPD (chronic obstructive pulmonary disease) ([place])   . Hypertension   . Shingles    10/2015    Past Surgical History:  Procedure Laterality Date  . BREAST EXCISIONAL BIOPSY Right 11/29/13   two areas  . BREAST EXCISIONAL BIOPSY Right 10/30/2009   lumpectomy - radiation      reports that she has never smoked. She has never used smokeless tobacco. She reports that she does not drink alcohol or use drugs. Social History   Social History  . Marital status: Married    Spouse name: N/A  . Number of children: N/A  . Years of education: N/A   Occupational History  . Not on file.   Social History Main Topics  . Smoking status: Never Smoker  . Smokeless tobacco: Never Used  . Alcohol use No  . Drug use: No  . Sexual activity: Not on file   Other Topics Concern  . Not on file   Social History Narrative  . No narrative on file   Functional Status Survey:    Allergies  Allergen Reactions  . Penicillins Hives  . Captopril Other (See Comments)  . Enalapril Maleate     Other reaction(s): Unknown  . Seldane  [Terfenadine] Other (See Comments)  . Tape Itching  . Augmentin [Amoxicillin-Pot Clavulanate] Rash  . Biaxin [  Clarithromycin] Rash, Other (See Comments) and Hives    Other reaction(s): Unknown    Pertinent  Health Maintenance Due  Topic Date Due  . PNA vac Low Risk Adult (1 of 2 - PCV13) 09/25/1999  . INFLUENZA VACCINE  03/17/2017  . DEXA SCAN  Completed    Medications: Allergies as of 01/01/2017      Reactions   Penicillins Hives   Captopril Other (See Comments)   Enalapril Maleate    Other reaction(s): Unknown   Seldane  [terfenadine] Other (See Comments)   Tape Itching   Augmentin [amoxicillin-pot Clavulanate] Rash   Biaxin [clarithromycin] Rash, Other (See  Comments), Hives   Other reaction(s): Unknown      Medication List       Accurate as of 01/01/17 11:59 PM. Always use your most recent med list.          acetaminophen 325 MG tablet Commonly known as:  TYLENOL Take 2 tablets (650 mg total) by mouth every 6 (six) hours as needed for mild pain (or Fever >/= 101).   alendronate 70 MG tablet Commonly known as:  FOSAMAX Take 70 mg by mouth once a week. Take with a full glass of water on an empty stomach.   ALPRAZolam 0.25 MG tablet Commonly known as:  XANAX Take 1 tablet (0.25 mg total) by mouth 3 (three) times daily as needed for anxiety.   aspirin 81 MG tablet Take 81 mg by mouth 2 (two) times daily.   atorvastatin 20 MG tablet Commonly known as:  LIPITOR Take 20 mg by mouth daily.   cholecalciferol 1000 units tablet Commonly known as:  VITAMIN D Take 1,000 Units by mouth daily.   docusate sodium 100 MG capsule Commonly known as:  COLACE Take 1 capsule (100 mg total) by mouth daily.   esomeprazole 40 MG capsule Commonly known as:  NEXIUM Take 40 mg by mouth daily at 12 noon.   feeding supplement (ENSURE ENLIVE) Liqd Take 237 mLs by mouth 3 (three) times daily with meals.   fexofenadine 180 MG tablet Commonly known as:  ALLEGRA Take 180 mg by mouth daily.   FLUoxetine 20 MG capsule Commonly known as:  PROZAC Take 20 mg by mouth 2 (two) times daily.   fluticasone 50 MCG/ACT nasal spray Commonly known as:  FLONASE Place into both nostrils daily.   Fluticasone-Salmeterol 100-50 MCG/DOSE Aepb Commonly known as:  ADVAIR Inhale 1 puff into the lungs 2 (two) times daily.   hydrALAZINE 25 MG tablet Commonly known as:  APRESOLINE Take 1 tablet (25 mg total) by mouth every 6 (six) hours.   hydrochlorothiazide 25 MG tablet Commonly known as:  HYDRODIURIL Take 1 tablet (25 mg total) by mouth daily.   HYDROcodone-acetaminophen 5-325 MG tablet Commonly known as:  NORCO/VICODIN Take 1 tablet by mouth every 6 (six)  hours as needed for severe pain.   ipratropium 0.02 % nebulizer solution Commonly known as:  ATROVENT Take 0.5 mg by nebulization 4 (four) times daily as needed.   levofloxacin 750 MG tablet Commonly known as:  LEVAQUIN Take 1 tablet (750 mg total) by mouth daily.   levothyroxine 75 MCG tablet Commonly known as:  SYNTHROID, LEVOTHROID Take 1 tablet (75 mcg total) by mouth daily before breakfast.   losartan 100 MG tablet Commonly known as:  COZAAR Take 1 tablet (100 mg total) by mouth daily.   magnesium oxide 400 MG tablet Commonly known as:  MAG-OX Take 400 mg by mouth 2 (two) times daily.  meloxicam 15 MG tablet Commonly known as:  MOBIC Take 15 mg by mouth as needed for pain.   metoprolol succinate 100 MG 24 hr tablet Commonly known as:  TOPROL-XL Take 1 tablet (100 mg total) by mouth 2 (two) times daily with a meal. Take with or immediately following a meal.   mouth rinse Liqd solution 15 mLs by Mouth Rinse route 2 (two) times daily.   ondansetron 4 MG tablet Commonly known as:  ZOFRAN Take 1 tablet (4 mg total) by mouth every 6 (six) hours as needed for nausea.   QUEtiapine 25 MG tablet Commonly known as:  SEROQUEL Take 1 tablet (25 mg total) by mouth at bedtime.       Review of Systems  Unable to perform ROS: Dementia  Constitutional: Negative for activity change, appetite change, chills, diaphoresis and fever.  HENT: Negative for congestion, sneezing, sore throat, trouble swallowing and voice change.   Respiratory: Negative for apnea, cough, choking, chest tightness, shortness of breath and wheezing.   Cardiovascular: Negative for chest pain, palpitations and leg swelling.  Gastrointestinal: Negative for abdominal distention, abdominal pain, constipation, diarrhea and nausea.  Genitourinary: Negative for difficulty urinating, dysuria, frequency and urgency.  Musculoskeletal: Positive for arthralgias (typical arthritis). Negative for back pain, gait problem  and myalgias.  Skin: Positive for wound. Negative for color change, pallor and rash.  Neurological: Negative for dizziness, tremors, syncope, speech difficulty, weakness, numbness and headaches.  Psychiatric/Behavioral: Negative for agitation and behavioral problems.  All other systems reviewed and are negative.   Vitals:   01/01/17 1700  BP: 126/61  Pulse: (!) 54  Resp: 20  Temp: 97.5 F (36.4 C)  SpO2: 98%   There is no height or weight on file to calculate BMI. Physical Exam  Constitutional: She is oriented to person, place, and time. Vital signs are normal. She appears well-developed and well-nourished. She is active and cooperative. She does not appear ill. No distress.  HENT:  Head: Normocephalic and atraumatic.  Mouth/Throat: Uvula is midline, oropharynx is clear and moist and mucous membranes are normal. Mucous membranes are not pale, not dry and not cyanotic.  Eyes: Conjunctivae, EOM and lids are normal. Pupils are equal, round, and reactive to light.  Neck: Trachea normal, normal range of motion and full passive range of motion without pain. Neck supple. No JVD present. No tracheal deviation, no edema and no erythema present. No thyromegaly present.  Cardiovascular: Normal rate, regular rhythm, normal heart sounds, intact distal pulses and normal pulses.  Exam reveals no gallop, no distant heart sounds and no friction rub.   No murmur heard. Pulmonary/Chest: Effort normal and breath sounds normal. No accessory muscle usage. No respiratory distress. She has no wheezes. She has no rales. She exhibits no tenderness.  Abdominal: Normal appearance and bowel sounds are normal. She exhibits no distension and no ascites. There is no tenderness.  Musculoskeletal: She exhibits no edema or tenderness.       Lumbar back: She exhibits decreased range of motion, laceration (surgical incision) and pain.       Back:  Expected osteoarthritis, stiffness; calves soft, supple. Negative Homan's  sign.  Neurological: She is alert and oriented to person, place, and time. She has normal strength.  Skin: Skin is warm and dry. Laceration (lumbar incision) noted. No rash noted. She is not diaphoretic. No cyanosis or erythema. No pallor. Nails show no clubbing.  Psychiatric: She has a normal mood and affect. Her speech is normal and behavior is  normal. Judgment and thought content normal. Cognition and memory are normal.  Nursing note and vitals reviewed.   Labs reviewed: Basic Metabolic Panel:  Recent Labs  11/12/16 0645  12/20/16 0444 12/21/16 0431 12/22/16 0610  NA 133*  < > 137 139 138  K 4.5  < > 3.0* 4.2 4.3  CL 94*  < > 103 104 101  CO2 32  < > 27 29 27   GLUCOSE 101*  < > 109* 99 105*  BUN 10  < > 25* 24* 18  CREATININE 0.75  < > 1.05* 1.04* 0.83  CALCIUM 9.0  < > 8.6* 9.1 9.5  MG 1.9  --  1.7  --   --   < > = values in this interval not displayed. Liver Function Tests:  Recent Labs  11/12/16 0645 12/14/16 1520 12/18/16 0005  AST 32 37 54*  ALT 18 19 30   ALKPHOS 53 87 85  BILITOT 1.2 1.6* 1.1  PROT 6.7 7.7 8.0  ALBUMIN 3.3* 4.1 4.2   No results for input(s): LIPASE, AMYLASE in the last 8760 hours. No results for input(s): AMMONIA in the last 8760 hours. CBC:  Recent Labs  11/12/16 0645 12/14/16 1520 12/18/16 0047 12/20/16 0444  WBC 8.1 8.7 16.0* 9.2  NEUTROABS 5.6  --  12.5*  --   HGB 11.1* 12.2 12.3 12.0  HCT 32.3* 37.1 37.8 36.1  MCV 86.7 88.5 86.4 85.1  PLT 273 186 267 249   Cardiac Enzymes:  Recent Labs  12/18/16 0005  TROPONINI 0.03*   BNP: Invalid input(s): POCBNP CBG: No results for input(s): GLUCAP in the last 8760 hours.  Procedures and Imaging Studies During Stay: Dg Chest 2 View  Result Date: 12/14/2016 CLINICAL DATA:  83 year old currently in rehabilitation after lumbar surgery, presenting with progressive generalized weakness and shortness of breath with walking. Nonsmoker. Current history of asthma. Personal history of  right breast cancer post lumpectomy in 2011. EXAM: CHEST  2 VIEW COMPARISON:  06/01/2016, 08/18/2015. FINDINGS: Cardiac silhouette moderately enlarged, unchanged. Thoracic aorta atherosclerotic, unchanged. Hilar and mediastinal contours otherwise unremarkable. Suboptimal inspiration which accounts for crowded bronchovascular markings in the lung bases. Prominent bronchovascular markings diffusely and mild to moderate central peribronchial thickening, unchanged. Lungs otherwise clear. No localized airspace consolidation. No pleural effusions. No pneumothorax. Normal pulmonary vascularity. Degenerative changes involving the thoracic and upper lumbar spine. Prior left shoulder arthroplasty with anatomic alignment. IMPRESSION: 1. Suboptimal inspiration. No acute cardiopulmonary disease. Stable mild-to-moderate changes of chronic bronchitis and/or asthma. 2. Stable moderate cardiomegaly without pulmonary edema. Electronically Signed   By: Evangeline Dakin M.D.   On: 12/14/2016 17:13   Ct Head Wo Contrast  Result Date: 12/18/2016 CLINICAL DATA:  Dyspnea tonight when getting up to use the restroom. Confusion and fatigue for the past week. Recently seen in the emergency department on April 30. EXAM: CT HEAD WITHOUT CONTRAST TECHNIQUE: Contiguous axial images were obtained from the base of the skull through the vertex without intravenous contrast. COMPARISON:  12/14/2016, 03/16/2013. FINDINGS: Brain: There is no intracranial hemorrhage, mass or evidence of acute infarction. There is moderate generalized atrophy. There is moderate chronic microvascular ischemic change. There is no significant extra-axial fluid collection. No acute intracranial findings are evident. Vascular: No hyperdense vessel or unexpected calcification. Skull: Normal. Negative for fracture or focal lesion. Sinuses/Orbits: No acute finding. Other: None. IMPRESSION: No acute intracranial findings. There is moderate generalized atrophy and chronic  appearing white matter hypodensities which likely represent small vessel ischemic disease. Electronically Signed  By: Andreas Newport M.D.   On: 12/18/2016 01:00   Ct Head Wo Contrast  Result Date: 12/14/2016 CLINICAL DATA:  Altered mental status.  Recent back surgery. EXAM: CT HEAD WITHOUT CONTRAST TECHNIQUE: Contiguous axial images were obtained from the base of the skull through the vertex without intravenous contrast. COMPARISON:  Brain MRI 03/16/2013 FINDINGS: Brain: No evidence of acute infarction, hemorrhage, hydrocephalus, extra-axial collection or mass lesion/mass effect. Mild for age generalized cerebral volume loss. Dilated perivascular spaces below the putamina. Mild periventricular chronic microvascular ischemic change. Vascular: No acute finding. Skull: No acute or aggressive finding. Sinuses/Orbits: Status post endoscopic sinus surgery cataract resections. No acute finding. IMPRESSION: Senescent changes without acute superimposed finding. Electronically Signed   By: Monte Fantasia M.D.   On: 12/14/2016 16:51   Mr Brain Wo Contrast  Result Date: 12/18/2016 CLINICAL DATA:  Altered mental status since surgery 6 weeks ago. EXAM: MRI HEAD WITHOUT CONTRAST TECHNIQUE: Multiplanar, multiecho pulse sequences of the brain and surrounding structures were obtained without intravenous contrast. COMPARISON:  Head CT earlier today.  Brain MRI 03/16/2013 FINDINGS: Brain: No acute infarction, hemorrhage, hydrocephalus, extra-axial collection or mass lesion. Mild chronic microvascular ischemic change in the periventricular white matter. Generalized cerebral volume loss, commonly seen by this age but progressed from 2014. Presumed dilated perivascular spaces below the putamina. Vascular: Major flow voids are preserved Skull and upper cervical spine: New negative for marrow lesion Sinuses/Orbits: Mild patchy mucosal thickening in the paranasal sinuses. Status post endoscopic sinus surgery. Bilateral cataract  resection. IMPRESSION: Senescent changes without acute finding. Electronically Signed   By: Monte Fantasia M.D.   On: 12/18/2016 12:28   US Carotid Bilateral (at [facility] Only)  Result Date: 12/18/2016 CLINICAL DATA:  83 year old female with a history of stroke. Cardiovascular risk factors include hypertension, known prior stroke/ TIA, hyperlipidemia EXAM: BILATERAL CAROTID DUPLEX ULTRASOUND TECHNIQUE: Pearline Cables scale imaging, color Doppler and duplex ultrasound were performed of bilateral carotid and vertebral arteries in the neck. COMPARISON:  No prior duplex FINDINGS: Criteria: Quantification of carotid stenosis is based on velocity parameters that correlate the residual internal carotid diameter with NASCET-based stenosis levels, using the diameter of the distal internal carotid lumen as the denominator for stenosis measurement. The following velocity measurements were obtained: RIGHT ICA:  Systolic 878 cm/sec, Diastolic 19 cm/sec CCA:  93 cm/sec SYSTOLIC ICA/CCA RATIO:  1.2 ECA:  156 cm/sec LEFT ICA:  Systolic 99 cm/sec, Diastolic 31 cm/sec CCA:  72 cm/sec SYSTOLIC ICA/CCA RATIO:  1.4 ECA:  156 cm/sec Right Brachial SBP: Not acquired Left Brachial SBP: Not acquired RIGHT CAROTID ARTERY: No significant calcifications of the right common carotid artery. Intermediate waveform maintained. Heterogeneous and partially calcified plaque at the right carotid bifurcation. No significant lumen shadowing. Low resistance waveform of the right ICA. Mild tortuosity RIGHT VERTEBRAL ARTERY: Antegrade flow with low resistance waveform. LEFT CAROTID ARTERY: No significant calcifications of the left common carotid artery. Intermediate waveform maintained. Heterogeneous and partially calcified plaque at the left carotid bifurcation without significant lumen shadowing. Low resistance waveform of the left ICA. Mild tortuosity LEFT VERTEBRAL ARTERY:  Antegrade flow with low resistance waveform. IMPRESSION: Color duplex indicates  minimal heterogeneous and calcified plaque, with no hemodynamically significant stenosis by duplex criteria in the extracranial cerebrovascular circulation. Signed, Dulcy Fanny. Earleen Newport, DO [facility] Electronically Signed   By: Corrie Mckusick D.O.   On: 12/18/2016 10:37   Dg Chest Port 1 View  Result Date: 12/19/2016 CLINICAL DATA:  Hypertension. Hx of asthma, breast cancer, bronchitis, COPD. Nonsmoker. Right breast biopsy. EXAM: PORTABLE CHEST 1 VIEW COMPARISON:  12/17/2016 FINDINGS: Mildly degraded exam due to AP portable technique and patient body habitus. Midline trachea. Cardiomegaly accentuated by AP portable technique. Numerous leads and wires project over the chest. Atherosclerosis in the transverse aorta. Layering right pleural effusion is similar. No pneumothorax. Right-sided interstitial and airspace disease is lower lobe predominant. IMPRESSION: Given differences in technique, similar right pleural fluid with asymmetric pulmonary edema versus pneumonia. Cardiomegaly and aortic atherosclerosis. Electronically Signed   By: Abigail Miyamoto M.D.   On: 12/19/2016 14:27   Dg Chest Port 1 View  Result Date: 12/18/2016 CLINICAL DATA:  Hypertensive.  Altered mental status. EXAM: PORTABLE CHEST 1 VIEW COMPARISON:  12/14/2016 FINDINGS: Patchy airspace opacity in the central right lung. Left lung is clear. Mild vascular prominence. Unchanged mild cardiomegaly. No large pleural effusion. IMPRESSION: Central right lung opacity may represent infectious infiltrate or asymmetric alveolar edema. Unchanged cardiomegaly. Electronically Signed   By: Andreas Newport M.D.   On: 12/18/2016 00:11    Assessment/Plan:   1. AKI (acute kidney injury) ([place])  Stable  Encourage po fluid intake  Renally adjust meds as appropriate  2. Hypertension, unspecified type  Stable  Follow up with PCP asap after discharge for continuity of care   Patient is being  discharged with the following home health services:  HHPT/OT/NSG/SLP  Patient is being discharged with the following durable medical equipment: none   Patient has been advised to f/u with their PCP in 1-2 weeks to bring them up to date on their rehab stay.  Social services at facility was responsible for arranging this appointment.  Pt was provided with a 30 day supply of prescriptions for medications and refills must be obtained from their PCP.  For controlled substances, a more limited supply may be provided adequate until PCP appointment only.  Future labs/tests needed:  Per pcp  Family/ staff Communication:   Total Time:  Documentation:  Face to Face:  Family/Phone:  Vikki Ports, NP-C [facility] [address] [place], [place] [address] Cell Phone (Mon-Fri 8am-5pm):  [phone] On Call:  [phone] & follow prompts after 5pm & weekends Office Phone:  [phone] Office Fax:  [phone]

## 2017-01-15 ENCOUNTER — Ambulatory Visit
Admission: RE | Admit: 2017-01-15 | Discharge: 2017-01-15 | Disposition: A | Discharge status: Home / Self Care | Payer: Medicare Other | Source: Ambulatory Visit | Attending: Surgery | Admitting: Surgery

## 2017-01-15 DIAGNOSIS — R921 Mammographic calcification found on diagnostic imaging of breast: Secondary | ICD-10-CM | POA: Insufficient documentation

## 2017-01-15 DIAGNOSIS — Z853 Personal history of malignant neoplasm of breast: Secondary | ICD-10-CM | POA: Diagnosis present

## 2017-01-19 ENCOUNTER — Encounter: Payer: Self-pay | Admitting: Student

## 2017-01-19 ENCOUNTER — Emergency Department: Payer: Medicare Other

## 2017-01-19 ENCOUNTER — Inpatient Hospital Stay
Admission: EM | Admit: 2017-01-19 | Discharge: 2017-01-21 | DRG: 871 | Disposition: A | Discharge status: Home Health | Payer: Medicare Other | Attending: Internal Medicine | Admitting: Internal Medicine

## 2017-01-19 DIAGNOSIS — Z8249 Family history of ischemic heart disease and other diseases of the circulatory system: Secondary | ICD-10-CM

## 2017-01-19 DIAGNOSIS — Z803 Family history of malignant neoplasm of breast: Secondary | ICD-10-CM

## 2017-01-19 DIAGNOSIS — Y95 Nosocomial condition: Secondary | ICD-10-CM | POA: Diagnosis present

## 2017-01-19 DIAGNOSIS — A419 Sepsis, unspecified organism: Secondary | ICD-10-CM

## 2017-01-19 DIAGNOSIS — Z853 Personal history of malignant neoplasm of breast: Secondary | ICD-10-CM

## 2017-01-19 DIAGNOSIS — Z880 Allergy status to penicillin: Secondary | ICD-10-CM | POA: Diagnosis not present

## 2017-01-19 DIAGNOSIS — J811 Chronic pulmonary edema: Secondary | ICD-10-CM | POA: Diagnosis present

## 2017-01-19 DIAGNOSIS — E039 Hypothyroidism, unspecified: Secondary | ICD-10-CM | POA: Diagnosis present

## 2017-01-19 DIAGNOSIS — E785 Hyperlipidemia, unspecified: Secondary | ICD-10-CM | POA: Diagnosis present

## 2017-01-19 DIAGNOSIS — Z7951 Long term (current) use of inhaled steroids: Secondary | ICD-10-CM

## 2017-01-19 DIAGNOSIS — Z888 Allergy status to other drugs, medicaments and biological substances status: Secondary | ICD-10-CM

## 2017-01-19 DIAGNOSIS — Z7982 Long term (current) use of aspirin: Secondary | ICD-10-CM | POA: Diagnosis not present

## 2017-01-19 DIAGNOSIS — Z9981 Dependence on supplemental oxygen: Secondary | ICD-10-CM | POA: Diagnosis not present

## 2017-01-19 DIAGNOSIS — I4819 Other persistent atrial fibrillation: Secondary | ICD-10-CM

## 2017-01-19 DIAGNOSIS — J440 Chronic obstructive pulmonary disease with acute lower respiratory infection: Secondary | ICD-10-CM | POA: Diagnosis present

## 2017-01-19 DIAGNOSIS — F329 Major depressive disorder, single episode, unspecified: Secondary | ICD-10-CM | POA: Diagnosis present

## 2017-01-19 DIAGNOSIS — I10 Essential (primary) hypertension: Secondary | ICD-10-CM | POA: Diagnosis present

## 2017-01-19 DIAGNOSIS — Z7983 Long term (current) use of bisphosphonates: Secondary | ICD-10-CM

## 2017-01-19 DIAGNOSIS — R0902 Hypoxemia: Secondary | ICD-10-CM | POA: Diagnosis present

## 2017-01-19 DIAGNOSIS — I481 Persistent atrial fibrillation: Secondary | ICD-10-CM | POA: Diagnosis present

## 2017-01-19 DIAGNOSIS — J189 Pneumonia, unspecified organism: Secondary | ICD-10-CM | POA: Diagnosis present

## 2017-01-19 DIAGNOSIS — J9621 Acute and chronic respiratory failure with hypoxia: Secondary | ICD-10-CM | POA: Diagnosis present

## 2017-01-19 DIAGNOSIS — N179 Acute kidney failure, unspecified: Secondary | ICD-10-CM | POA: Diagnosis present

## 2017-01-19 DIAGNOSIS — Z79899 Other long term (current) drug therapy: Secondary | ICD-10-CM | POA: Diagnosis not present

## 2017-01-19 DIAGNOSIS — Z823 Family history of stroke: Secondary | ICD-10-CM

## 2017-01-19 LAB — COMPREHENSIVE METABOLIC PANEL
ALBUMIN: 4 g/dL (ref 3.5–5.0)
ALT: 20 U/L (ref 14–54)
AST: 46 U/L — AB (ref 15–41)
Alkaline Phosphatase: 72 U/L (ref 38–126)
Anion gap: 11 (ref 5–15)
BILIRUBIN TOTAL: 1.3 mg/dL — AB (ref 0.3–1.2)
BUN: 15 mg/dL (ref 6–20)
CO2: 28 mmol/L (ref 22–32)
Calcium: 9.4 mg/dL (ref 8.9–10.3)
Chloride: 93 mmol/L — ABNORMAL LOW (ref 101–111)
Creatinine, Ser: 1.11 mg/dL — ABNORMAL HIGH (ref 0.44–1.00)
GFR calc Af Amer: 52 mL/min — ABNORMAL LOW (ref >60)
GFR calc non Af Amer: 45 mL/min — ABNORMAL LOW (ref >60)
GLUCOSE: 133 mg/dL — AB (ref 65–99)
POTASSIUM: 3.6 mmol/L (ref 3.5–5.1)
Sodium: 132 mmol/L — ABNORMAL LOW (ref 135–145)
TOTAL PROTEIN: 8 g/dL (ref 6.5–8.1)

## 2017-01-19 LAB — URINALYSIS, COMPLETE (UACMP) WITH MICROSCOPIC
BILIRUBIN URINE: NEGATIVE
Glucose, UA: NEGATIVE mg/dL
Ketones, ur: NEGATIVE mg/dL
Nitrite: NEGATIVE
PROTEIN: NEGATIVE mg/dL
Specific Gravity, Urine: 1.006 (ref 1.005–1.030)
pH: 7 (ref 5.0–8.0)

## 2017-01-19 LAB — BLOOD GAS, ARTERIAL
ACID-BASE EXCESS: 3.9 mmol/L — AB (ref 0.0–2.0)
Bicarbonate: 27.7 mmol/L (ref 20.0–28.0)
FIO2: 0.28
O2 SAT: 94 %
Patient temperature: 37
pCO2 arterial: 38 mmHg (ref 32.0–48.0)
pH, Arterial: 7.47 — ABNORMAL HIGH (ref 7.350–7.450)
pO2, Arterial: 66 mmHg — ABNORMAL LOW (ref 83.0–108.0)

## 2017-01-19 LAB — CBC
HEMATOCRIT: 36.6 % (ref 35.0–47.0)
Hemoglobin: 12.3 g/dL (ref 12.0–16.0)
MCH: 27.8 pg (ref 26.0–34.0)
MCHC: 33.6 g/dL (ref 32.0–36.0)
MCV: 82.5 fL (ref 80.0–100.0)
Platelets: 184 10*3/uL (ref 150–440)
RBC: 4.44 MIL/uL (ref 3.80–5.20)
RDW: 16.5 % — AB (ref 11.5–14.5)
WBC: 10.5 10*3/uL (ref 3.6–11.0)

## 2017-01-19 LAB — LACTIC ACID, PLASMA
Lactic Acid, Venous: 2.7 mmol/L (ref 0.5–1.9)
Lactic Acid, Venous: 1.5 mmol/L (ref 0.5–1.9)

## 2017-01-19 LAB — BRAIN NATRIURETIC PEPTIDE: B Natriuretic Peptide: 827 pg/mL — ABNORMAL HIGH (ref 0.0–100.0)

## 2017-01-19 LAB — TSH: TSH: 8.339 u[IU]/mL — ABNORMAL HIGH (ref 0.350–4.500)

## 2017-01-19 LAB — TROPONIN I: Troponin I: <0.03 ng/mL (ref <0.03)

## 2017-01-19 MED ORDER — LORATADINE 10 MG PO TABS
10.0000 mg | ORAL_TABLET | Freq: Every day | ORAL | Status: DC
  Administered 2017-01-19 – 2017-01-21 (×3): 10 mg via ORAL
  Filled 2017-01-19 (×3): qty 1

## 2017-01-19 MED ORDER — SODIUM CHLORIDE 0.9 % IV SOLN: INTRAVENOUS | Status: DC

## 2017-01-19 MED ORDER — FUROSEMIDE 10 MG/ML IJ SOLN
40.0000 mg | Freq: Once | INTRAMUSCULAR | Status: AC
  Administered 2017-01-19: 40 mg via INTRAVENOUS
  Filled 2017-01-19: qty 4

## 2017-01-19 MED ORDER — SODIUM CHLORIDE 0.9 % IV BOLUS (SEPSIS)
500.0000 mL | Freq: Once | INTRAVENOUS | Status: AC
  Administered 2017-01-19: 500 mL via INTRAVENOUS

## 2017-01-19 MED ORDER — VITAMIN D 1000 UNITS PO TABS
1000.0000 [IU] | ORAL_TABLET | Freq: Every day | ORAL | Status: DC
  Administered 2017-01-19 – 2017-01-21 (×3): 1000 [IU] via ORAL
  Filled 2017-01-19 (×3): qty 1

## 2017-01-19 MED ORDER — MOMETASONE FURO-FORMOTEROL FUM 200-5 MCG/ACT IN AERO
2.0000 | INHALATION_SPRAY | Freq: Two times a day (BID) | RESPIRATORY_TRACT | Status: DC
Start: 2017-01-19 — End: 2017-01-21
  Administered 2017-01-19 – 2017-01-21 (×5): 2 via RESPIRATORY_TRACT
  Filled 2017-01-19: qty 8.8

## 2017-01-19 MED ORDER — LOSARTAN POTASSIUM 50 MG PO TABS
100.0000 mg | ORAL_TABLET | Freq: Every day | ORAL | Status: DC
  Administered 2017-01-19 – 2017-01-21 (×3): 100 mg via ORAL
  Filled 2017-01-19 (×3): qty 2

## 2017-01-19 MED ORDER — LEVOTHYROXINE SODIUM 50 MCG PO TABS
75.0000 ug | ORAL_TABLET | Freq: Every day | ORAL | Status: DC
  Administered 2017-01-19 – 2017-01-21 (×3): 75 ug via ORAL
  Filled 2017-01-19 (×3): qty 1

## 2017-01-19 MED ORDER — VANCOMYCIN HCL IN DEXTROSE 1-5 GM/200ML-% IV SOLN
1000.0000 mg | Freq: Once | INTRAVENOUS | Status: AC
  Administered 2017-01-19: 1000 mg via INTRAVENOUS
  Filled 2017-01-19: qty 200

## 2017-01-19 MED ORDER — HYDRALAZINE HCL 50 MG PO TABS
25.0000 mg | ORAL_TABLET | Freq: Four times a day (QID) | ORAL | Status: DC
  Administered 2017-01-19 – 2017-01-21 (×4): 25 mg via ORAL
  Filled 2017-01-19 (×7): qty 1

## 2017-01-19 MED ORDER — HYDROCHLOROTHIAZIDE 25 MG PO TABS
25.0000 mg | ORAL_TABLET | Freq: Every day | ORAL | Status: DC
  Administered 2017-01-19 – 2017-01-21 (×3): 25 mg via ORAL
  Filled 2017-01-19 (×3): qty 1

## 2017-01-19 MED ORDER — ACETAMINOPHEN 650 MG RE SUPP: 650.0000 mg | Freq: Four times a day (QID) | RECTAL | Status: DC | PRN

## 2017-01-19 MED ORDER — MAGNESIUM OXIDE 400 (241.3 MG) MG PO TABS
400.0000 mg | ORAL_TABLET | Freq: Two times a day (BID) | ORAL | Status: DC
  Administered 2017-01-19 – 2017-01-21 (×5): 400 mg via ORAL
  Filled 2017-01-19 (×5): qty 1

## 2017-01-19 MED ORDER — NITROGLYCERIN 2 % TD OINT
0.5000 [in_us] | TOPICAL_OINTMENT | Freq: Once | TRANSDERMAL | Status: AC
  Administered 2017-01-19: 0.5 [in_us] via TOPICAL
  Filled 2017-01-19: qty 1

## 2017-01-19 MED ORDER — ONDANSETRON HCL 4 MG/2ML IJ SOLN
INTRAMUSCULAR | Status: AC
  Filled 2017-01-19: qty 2

## 2017-01-19 MED ORDER — ONDANSETRON HCL 4 MG/2ML IJ SOLN
4.0000 mg | Freq: Once | INTRAMUSCULAR | Status: AC
  Administered 2017-01-19: 4 mg via INTRAVENOUS

## 2017-01-19 MED ORDER — IPRATROPIUM-ALBUTEROL 0.5-2.5 (3) MG/3ML IN SOLN
3.0000 mL | Freq: Once | RESPIRATORY_TRACT | Status: AC
  Administered 2017-01-19: 3 mL via RESPIRATORY_TRACT
  Filled 2017-01-19: qty 3

## 2017-01-19 MED ORDER — ASPIRIN 81 MG PO CHEW
81.0000 mg | CHEWABLE_TABLET | Freq: Every day | ORAL | Status: DC
  Administered 2017-01-19 – 2017-01-21 (×3): 81 mg via ORAL
  Filled 2017-01-19 (×3): qty 1

## 2017-01-19 MED ORDER — ONDANSETRON HCL 4 MG PO TABS
4.0000 mg | ORAL_TABLET | Freq: Four times a day (QID) | ORAL | Status: DC | PRN
  Administered 2017-01-21: 4 mg via ORAL
  Filled 2017-01-19: qty 1

## 2017-01-19 MED ORDER — METHYLPREDNISOLONE SODIUM SUCC 125 MG IJ SOLR
125.0000 mg | Freq: Once | INTRAMUSCULAR | Status: AC
  Administered 2017-01-19: 125 mg via INTRAVENOUS
  Filled 2017-01-19: qty 2

## 2017-01-19 MED ORDER — SODIUM CHLORIDE 0.9 % IV SOLN
1250.0000 mg | INTRAVENOUS | Status: DC
  Administered 2017-01-19: 15:00:00 1250 mg via INTRAVENOUS
  Filled 2017-01-19 (×3): qty 1250

## 2017-01-19 MED ORDER — ATORVASTATIN CALCIUM 20 MG PO TABS
20.0000 mg | ORAL_TABLET | Freq: Every day | ORAL | Status: DC
  Administered 2017-01-19 – 2017-01-20 (×2): 20 mg via ORAL
  Filled 2017-01-19 (×2): qty 1

## 2017-01-19 MED ORDER — IPRATROPIUM-ALBUTEROL 0.5-2.5 (3) MG/3ML IN SOLN
3.0000 mL | RESPIRATORY_TRACT | Status: DC
  Administered 2017-01-19 – 2017-01-20 (×6): 3 mL via RESPIRATORY_TRACT
  Filled 2017-01-19 (×6): qty 3

## 2017-01-19 MED ORDER — ONDANSETRON HCL 4 MG/2ML IJ SOLN: 4.0000 mg | Freq: Four times a day (QID) | INTRAMUSCULAR | Status: DC | PRN

## 2017-01-19 MED ORDER — FLUTICASONE PROPIONATE 50 MCG/ACT NA SUSP
2.0000 | Freq: Every day | NASAL | Status: DC
  Administered 2017-01-19 – 2017-01-21 (×3): 2 via NASAL
  Filled 2017-01-19: qty 16

## 2017-01-19 MED ORDER — DEXTROSE 5 % IV SOLN
2.0000 g | Freq: Two times a day (BID) | INTRAVENOUS | Status: DC
  Administered 2017-01-19 – 2017-01-20 (×3): 2 g via INTRAVENOUS
  Filled 2017-01-19 (×4): qty 2

## 2017-01-19 MED ORDER — ACETAMINOPHEN 325 MG PO TABS
650.0000 mg | ORAL_TABLET | Freq: Four times a day (QID) | ORAL | Status: DC | PRN
  Administered 2017-01-21: 650 mg via ORAL
  Filled 2017-01-19: qty 2

## 2017-01-19 MED ORDER — HEPARIN SODIUM (PORCINE) 5000 UNIT/ML IJ SOLN
5000.0000 [IU] | Freq: Three times a day (TID) | INTRAMUSCULAR | Status: DC
  Administered 2017-01-19 – 2017-01-21 (×6): 5000 [IU] via SUBCUTANEOUS
  Filled 2017-01-19 (×5): qty 1

## 2017-01-19 MED ORDER — PANTOPRAZOLE SODIUM 40 MG PO TBEC
40.0000 mg | DELAYED_RELEASE_TABLET | Freq: Every day | ORAL | Status: DC
  Administered 2017-01-19 – 2017-01-20 (×2): 40 mg via ORAL
  Filled 2017-01-19 (×2): qty 1

## 2017-01-19 MED ORDER — GUAIFENESIN-DM 100-10 MG/5ML PO SYRP
5.0000 mL | ORAL_SOLUTION | ORAL | Status: DC | PRN
  Administered 2017-01-19 – 2017-01-20 (×2): 5 mL via ORAL
  Filled 2017-01-19 (×2): qty 5

## 2017-01-19 MED ORDER — FLUOXETINE HCL 20 MG PO CAPS
20.0000 mg | ORAL_CAPSULE | Freq: Two times a day (BID) | ORAL | Status: DC
  Administered 2017-01-19 – 2017-01-21 (×5): 20 mg via ORAL
  Filled 2017-01-19 (×5): qty 1

## 2017-01-19 MED ORDER — METOPROLOL SUCCINATE ER 50 MG PO TB24
100.0000 mg | ORAL_TABLET | Freq: Two times a day (BID) | ORAL | Status: DC
  Administered 2017-01-19 – 2017-01-21 (×5): 100 mg via ORAL
  Filled 2017-01-19 (×5): qty 2

## 2017-01-19 MED ORDER — QUETIAPINE FUMARATE 25 MG PO TABS
25.0000 mg | ORAL_TABLET | Freq: Every day | ORAL | Status: DC
  Administered 2017-01-19 – 2017-01-20 (×2): 25 mg via ORAL
  Filled 2017-01-19 (×2): qty 1

## 2017-01-19 MED ORDER — METOPROLOL TARTRATE 5 MG/5ML IV SOLN
2.5000 mg | Freq: Once | INTRAVENOUS | Status: AC
  Administered 2017-01-19: 2.5 mg via INTRAVENOUS
  Filled 2017-01-19: qty 5

## 2017-01-19 MED ORDER — DOCUSATE SODIUM 100 MG PO CAPS
100.0000 mg | ORAL_CAPSULE | Freq: Every day | ORAL | Status: DC
Start: 2017-01-19 — End: 2017-01-21
  Administered 2017-01-19 – 2017-01-21 (×3): 100 mg via ORAL
  Filled 2017-01-19 (×3): qty 1

## 2017-01-19 MED ORDER — DEXTROSE 5 % IV SOLN
2.0000 g | Freq: Once | INTRAVENOUS | Status: DC
  Filled 2017-01-19: qty 2

## 2017-01-19 MED ORDER — ALPRAZOLAM 0.25 MG PO TABS
0.2500 mg | ORAL_TABLET | Freq: Three times a day (TID) | ORAL | Status: DC | PRN
  Administered 2017-01-19 – 2017-01-20 (×2): 0.25 mg via ORAL
  Filled 2017-01-19 (×2): qty 1

## 2017-01-19 NOTE — ED Notes (Signed)
Attempting to call report at this time. 

## 2017-01-19 NOTE — ED Triage Notes (Addendum)
Pt to triage via w/c with no distress noted, occas barky cough noted; pt reports last 2 days has had persistent cough and "vomiting"; when asked about emesis, pt st "just spitting out that phlegm"; describes as clear in color; pt denies any c/o pain or accomp symptoms; seen doctor yesterday and rx doxycyline and "some other things" but unsure of dx

## 2017-01-19 NOTE — Progress Notes (Signed)
[facility] NAME: Janice Perkins    MR#:  [id number]  DATE OF BIRTH:  30-Nov-1932  SUBJECTIVE:  CHIEF COMPLAINT:    REVIEW OF SYSTEMS:  CONSTITUTIONAL: No fever, fatigue or weakness.  EYES: No blurred or double vision.  EARS, NOSE, AND THROAT: No tinnitus or ear pain.  RESPIRATORY: No cough, shortness of breath, wheezing or hemoptysis.  CARDIOVASCULAR: No chest pain, orthopnea, edema.  GASTROINTESTINAL: No nausea, vomiting, diarrhea or abdominal pain.  GENITOURINARY: No dysuria, hematuria.  ENDOCRINE: No polyuria, nocturia,  HEMATOLOGY: No anemia, easy bruising or bleeding SKIN: No rash or lesion. MUSCULOSKELETAL: No joint pain or arthritis.   NEUROLOGIC: No tingling, numbness, weakness.  PSYCHIATRY: No anxiety or depression.   DRUG ALLERGIES:   Allergies  Allergen Reactions  . Penicillins Hives  . Captopril Other (See Comments)  . Enalapril Maleate     Other reaction(s): Unknown  . Seldane  [Terfenadine] Other (See Comments)  . Tape Itching  . Augmentin [Amoxicillin-Pot Clavulanate] Rash  . Biaxin [Clarithromycin] Rash, Other (See Comments) and Hives    Other reaction(s): Unknown    VITALS:  Blood pressure 137/77, pulse 64, temperature 97.7 F (36.5 C), temperature source Oral, resp. rate 18, height 4\' 11"  (1.499 m), weight 78.2 kg (172 lb 6 oz), SpO2 96 %.  PHYSICAL EXAMINATION:  GENERAL:  83 y.o.-year-old patient lying in the bed with no acute distress.  EYES: Pupils equal, round, reactive to light and accommodation. No scleral icterus. Extraocular muscles intact.  HEENT: Head atraumatic, normocephalic. Oropharynx and nasopharynx clear.  NECK:  Supple, no jugular venous distention. No thyroid enlargement, no tenderness.  LUNGS: Normal breath sounds bilaterally, no wheezing, rales,rhonchi or crepitation. No use of accessory muscles of respiration.  CARDIOVASCULAR: S1, S2 normal. No murmurs, rubs, or gallops.   ABDOMEN: Soft, nontender, nondistended. Bowel sounds present. No organomegaly or mass.  EXTREMITIES: No pedal edema, cyanosis, or clubbing.  NEUROLOGIC: Cranial nerves II through XII are intact. Muscle strength 5/5 in all extremities. Sensation intact. Gait not checked.  PSYCHIATRIC: The patient is alert and oriented x 3.  SKIN: No obvious rash, lesion, or ulcer.    LABORATORY PANEL:   CBC  Recent Labs Lab 01/19/17 0509  WBC 10.5  HGB 12.3  HCT 36.6  PLT 184   ------------------------------------------------------------------------------------------------------------------  Chemistries   Recent Labs Lab 01/19/17 0509  NA 132*  K 3.6  CL 93*  CO2 28  GLUCOSE 133*  BUN 15  CREATININE 1.11*  CALCIUM 9.4  AST 46*  ALT 20  ALKPHOS 72  BILITOT 1.3*   ------------------------------------------------------------------------------------------------------------------  Cardiac Enzymes  Recent Labs Lab 01/19/17 0509  TROPONINI <0.03   ------------------------------------------------------------------------------------------------------------------  RADIOLOGY:  Dg Chest 2 View  Result Date: 01/19/2017 CLINICAL DATA:  Shortness of breath.  Cough. EXAM: CHEST  2 VIEW COMPARISON:  Radiograph 12/19/2016 FINDINGS: Stable cardiomegaly and mediastinal contours. Atherosclerosis of the thoracic aorta. Decreased pulmonary edema from prior exam, there is persistent peribronchial cuffing. Trace right pleural effusion persists, improved from prior. No new focal airspace disease. No pneumothorax. Left shoulder arthroplasty partially included. IMPRESSION: Stable cardiomegaly and atherosclerosis of the thoracic aorta. Improved right pleural effusion with minimal residual blunting of costophrenic angle. Improved pulmonary edema. Mild persistent peribronchial cuffing may reflect persistent or recurrent edema or bronchitis. Electronically Signed   By: Jeb Levering M.D.   On: 01/19/2017  05:31    EKG:   Orders placed or performed during the hospital encounter of  01/19/17  . ED EKG 12-Lead  . ED EKG 12-Lead    ASSESSMENT AND PLAN:   This is an 83 year old female admitted for sepsis. # Sepsis: The patient meets criteria via tachycardia and tachypnea. Follow-up on the blood cultures Lactic acid normalized to 2.7-1.5 Continue IV antibiotics Fluids were given in the ED, patient is not currently  fluid overloade   # HCAP: Hypoxia, tachypnea,83 age and acute kidney injury prompted admission.  IV cefepime  and vancomycin  # Acute kidney failure: Possibly secondary to uncontrolled hypertension and or decreased flow. Avoid nephrotoxic agents. Patient has received IV fluids not fluid overloaded will monitor closely  #Pulmonary edema: Echocardiogram from last month normal. I will limit intravenous fluid resuscitation of sepsis protocol.   patient has received1 dose of Lasix 40 mg IV.  #Hypertension: Uncontrolled; continue hydrochlorothiazide, hydralazine and losartan. Nitropaste for now. Labetalol as needed.  #. Atrial fibrillation:  rate uncontrolled. Following some fluid resuscitation rate is improved. Continue metoprolol. Aspirin only for anticoagulation  7. COPD: Continue inhaled corticosteroid. DuoNeb's as needed  8. Hypothyroidism: TSH elevated at 8.33. We'll check free T4 and T3  continue Synthroid  9. Hyperlipidemia: Continue statin therapy 10. Depression: Continue Prozac    DVT prophylaxis: Heparin GI prophylaxis: None     All the records are reviewed and case discussed with Care Management/Social Workerr. Management plans discussed with the patient, family and they are in agreement.  CODE STATUS: fc   TOTAL TIME TAKING CARE OF THIS PATIENT: 36  minutes.   POSSIBLE D/C IN 2 DAYS, DEPENDING ON CLINICAL CONDITION.  Note: This dictation was prepared with Dragon dictation along with smaller phrase technology. Any transcriptional errors that result from  this process are unintentional.   Nicholes Mango M.D on 01/19/2017 at 2:47 PM  Between 7am to 6pm - Pager - [phone] After 6pm go to [email or web address] - password [email or web address] [facility]  Office  [phone]  CC: Primary care physician; [name], MD

## 2017-01-19 NOTE — ED Provider Notes (Signed)
[facility] Emergency Department Provider Note   ____________________________________________   First MD Initiated Contact with Patient 01/19/17 [phone]     (approximate)  I have reviewed the triage vital signs and the nursing notes.   HISTORY  Chief Complaint Cough    HPI Janice Perkins is a 83 y.o. female who presents to the ED from home with a chief complaint of fever, cough and shortness of breath. Patient with a history of COPD, hypertension, on oxygen only at night who reports a 2 day history of nonproductive cough with posttussive emesis. She was seen at urgent care yesterday and prescribed doxycycline. She had back surgery in March and developed postoperative pneumonia. She has been in and out of rehabilitation 2; most recently discharged home one week ago. Complains of low-grade fever, chills, shortness of breath, generalized weakness and posttussive emesis. Denies associated chest pain, abdominal pain,dysuria, diarrhea. Denies recent travel or trauma. Nothing makes her symptoms better or worse.   Past Medical History:  Diagnosis Date  . Asthma   . Breast cancer ([place]) 2011  . Bronchitis 04/2015  . Cancer [facility]) 2011   breast  . COPD (chronic obstructive pulmonary disease) ([place])   . Hypertension   . Shingles    10/2015    Patient Active Problem List   Diagnosis Date Noted  . HCAP (healthcare-associated pneumonia) 01/19/2017  . AKI (acute kidney injury) ([place]) 12/18/2016  . Primary cancer of upper outer quadrant of right female breast ([place]) 04/07/2016  . Lumbar radiculopathy 03/23/2016  . Spinal stenosis, lumbar region, with neurogenic claudication 03/23/2016  . DDD (degenerative disc disease), lumbar 01/14/2015  . Lumbosacral facet joint syndrome ([place]) 01/14/2015  . Sacroiliac joint dysfunction 01/14/2015  . Greater trochanteric bursitis 01/14/2015  . Bilateral occipital neuralgia 01/14/2015    Past Surgical History:  Procedure Laterality  Date  . BREAST EXCISIONAL BIOPSY Right 11/29/13   two areas  . BREAST EXCISIONAL BIOPSY Right 10/30/2009   lumpectomy - radiation    Prior to Admission medications   Medication Sig Start Date End Date Taking? Authorizing Provider  acetaminophen (TYLENOL) 325 MG tablet Take 2 tablets (650 mg total) by mouth every 6 (six) hours as needed for mild pain (or Fever >/= 101). 12/22/16  Yes Gouru, Illene Silver, MD  alendronate (FOSAMAX) 70 MG tablet Take 70 mg by mouth once a week. Take with a full glass of water on an empty stomach.   Yes [provider]  ALPRAZolam (XANAX) 0.25 MG tablet Take 1 tablet (0.25 mg total) by mouth 3 (three) times daily as needed for anxiety. 12/22/16  Yes Gouru, Illene Silver, MD  aspirin 81 MG tablet Take 81 mg by mouth daily.    Yes [provider]  atorvastatin (LIPITOR) 20 MG tablet Take 20 mg by mouth daily.   Yes [provider]  cholecalciferol (VITAMIN D) 1000 units tablet Take 1,000 Units by mouth daily.   Yes [provider]  docusate sodium (COLACE) 100 MG capsule Take 1 capsule (100 mg total) by mouth daily. 12/22/16  Yes Gouru, Illene Silver, MD  esomeprazole (NEXIUM) 40 MG capsule Take 40 mg by mouth daily at 12 noon.   Yes [provider]  fexofenadine (ALLEGRA) 180 MG tablet Take 180 mg by mouth daily.   Yes [provider]  FLUoxetine (PROZAC) 20 MG capsule Take 20 mg by mouth 2 (two) times daily.   Yes [provider]  fluticasone (FLONASE) 50 MCG/ACT nasal spray Place 2 sprays into both  nostrils daily.    Yes [provider]  Fluticasone-Salmeterol (ADVAIR) 250-50 MCG/DOSE AEPB Inhale 1 puff into the lungs 2 (two) times daily.   Yes [provider]  hydrALAZINE (APRESOLINE) 25 MG tablet Take 1 tablet (25 mg total) by mouth every 6 (six) hours. 12/22/16  Yes Gouru, Illene Silver, MD  hydrochlorothiazide (HYDRODIURIL) 25 MG tablet Take 1 tablet (25 mg total) by mouth daily. 12/23/16  Yes Gouru, Illene Silver, MD    levothyroxine (SYNTHROID, LEVOTHROID) 75 MCG tablet Take 1 tablet (75 mcg total) by mouth daily before breakfast. 12/23/16  Yes Gouru, Aruna, MD  losartan (COZAAR) 100 MG tablet Take 1 tablet (100 mg total) by mouth daily. 12/23/16  Yes Gouru, Illene Silver, MD  magnesium oxide (MAG-OX) 400 MG tablet Take 400 mg by mouth 2 (two) times daily.   Yes [provider]  metoprolol succinate (TOPROL-XL) 100 MG 24 hr tablet Take 1 tablet (100 mg total) by mouth 2 (two) times daily with a meal. Take with or immediately following a meal. 12/22/16  Yes Gouru, Aruna, MD  QUEtiapine (SEROQUEL) 25 MG tablet Take 1 tablet (25 mg total) by mouth at bedtime. 12/22/16  Yes Gouru, Illene Silver, MD    Allergies Penicillins; Captopril; Enalapril maleate; Seldane  [terfenadine]; Tape; Augmentin [amoxicillin-pot clavulanate]; and Biaxin [clarithromycin]  Family History  Problem Relation Age of Onset  . Hypertension Mother   . Heart disease Mother   . Cancer Mother   . Stroke Father   . Hypertension Father   . Breast cancer Sister     Social History Social History  Substance Use Topics  . Smoking status: Never Smoker  . Smokeless tobacco: Never Used  . Alcohol use No    Review of Systems  Constitutional: Positive for fever/chills. Eyes: No visual changes. ENT: No sore throat. Cardiovascular: Denies chest pain. Respiratory: Positive for cough and shortness of breath. Gastrointestinal: No abdominal pain.  Positive for posttussive emesis.  No diarrhea.  No constipation. Genitourinary: Negative for dysuria. Musculoskeletal: Negative for back pain. Skin: Negative for rash. Neurological: Negative for headaches, focal weakness or numbness.   ____________________________________________   PHYSICAL EXAM:  VITAL SIGNS: ED Triage Vitals  Enc Vitals Group     BP 01/19/17 0416 (!) 168/100     Pulse Rate 01/19/17 0416 (!) 117     Resp 01/19/17 0416 20     Temp 01/19/17 0416 99.2 F (37.3 C)     Temp Source  01/19/17 0416 Oral     SpO2 01/19/17 0416 93 %     Weight 01/19/17 0417 176 lb (79.8 kg)     Height 01/19/17 0417 5' (1.524 m)     Head Circumference --      Peak Flow --      Pain Score --      Pain Loc --      Pain Edu? --      Excl. in [address]? --     Constitutional: Alert and oriented. Well appearing and in mild acute distress. Eyes: Conjunctivae are normal. PERRL. EOMI. Head: Atraumatic. Nose: No congestion/rhinnorhea. Mouth/Throat: Mucous membranes are moist.  Oropharynx non-erythematous. Neck: No stridor.   Cardiovascular: Normal rate, irregular rhythm. Grossly normal heart sounds.  Good peripheral circulation. Respiratory: Increased respiratory effort.  No retractions. Lungs with rhonchi, rales and wheezing. Gastrointestinal: Soft and nontender. No distention. No abdominal bruits. No CVA tenderness. Musculoskeletal: No lower extremity tenderness nor edema.  No joint effusions. Neurologic:  Normal speech and language. No gross focal neurologic deficits  are appreciated.  Skin:  Skin is warm, dry and intact. No rash noted. Psychiatric: Mood and affect are normal. Speech and behavior are normal.  ____________________________________________   LABS (all labs ordered are listed, but only abnormal results are displayed)  Labs Reviewed  CBC - Abnormal; Notable for the following:       Result Value   RDW 16.5 (*)    All other components within normal limits  COMPREHENSIVE METABOLIC PANEL - Abnormal; Notable for the following:    Sodium 132 (*)    Chloride 93 (*)    Glucose, Bld 133 (*)    Creatinine, Ser 1.11 (*)    AST 46 (*)    Total Bilirubin 1.3 (*)    GFR calc non Af Amer 45 (*)    GFR calc Af Amer 52 (*)    All other components within normal limits  LACTIC ACID, PLASMA - Abnormal; Notable for the following:    Lactic Acid, Venous 2.7 (*)    All other components within normal limits  BRAIN NATRIURETIC PEPTIDE - Abnormal; Notable for the following:    B Natriuretic  Peptide 827.0 (*)    All other components within normal limits  BLOOD GAS, ARTERIAL - Abnormal; Notable for the following:    pH, Arterial 7.47 (*)    pO2, Arterial 66 (*)    Acid-Base Excess 3.9 (*)    All other components within normal limits  CULTURE, BLOOD (ROUTINE X 2)  CULTURE, BLOOD (ROUTINE X 2)  URINE CULTURE  TROPONIN I  LACTIC ACID, PLASMA  URINALYSIS, COMPLETE (UACMP) WITH MICROSCOPIC   ____________________________________________  EKG  ED ECG REPORT I, SUNG,JADE J, the attending physician, personally viewed and interpreted this ECG.   Date: 01/19/2017  EKG Time: 0438  Rate: 89  Rhythm: atrial fibrillation, rate 89  Axis: LAD  Intervals:none  ST&T Change: Nonspecific  ____________________________________________  RADIOLOGY  Dg Chest 2 View  Result Date: 01/19/2017 CLINICAL DATA:  Shortness of breath.  Cough. EXAM: CHEST  2 VIEW COMPARISON:  Radiograph 12/19/2016 FINDINGS: Stable cardiomegaly and mediastinal contours. Atherosclerosis of the thoracic aorta. Decreased pulmonary edema from prior exam, there is persistent peribronchial cuffing. Trace right pleural effusion persists, improved from prior. No new focal airspace disease. No pneumothorax. Left shoulder arthroplasty partially included. IMPRESSION: Stable cardiomegaly and atherosclerosis of the thoracic aorta. Improved right pleural effusion with minimal residual blunting of costophrenic angle. Improved pulmonary edema. Mild persistent peribronchial cuffing may reflect persistent or recurrent edema or bronchitis. Electronically Signed   By: Jeb Levering M.D.   On: 01/19/2017 05:31    ____________________________________________   PROCEDURES  Procedure(s) performed: None  Procedures  Critical Care performed: Yes, see critical care note(s)   CRITICAL CARE Performed by: Paulette Blanch   Total critical care time: 30 minutes  Critical care time was exclusive of separately billable procedures and  treating other patients.  Critical care was necessary to treat or prevent imminent or life-threatening deterioration.  Critical care was time spent personally by me on the following activities: development of treatment plan with patient and/or surrogate as well as nursing, discussions with consultants, evaluation of patient's response to treatment, examination of patient, obtaining history from patient or surrogate, ordering and performing treatments and interventions, ordering and review of laboratory studies, ordering and review of radiographic studies, pulse oximetry and re-evaluation of patient's condition.  ____________________________________________   INITIAL IMPRESSION / ASSESSMENT AND PLAN / ED COURSE  Pertinent labs & imaging results that were available during my  care of the patient were reviewed by me and considered in my medical decision making (see chart for details).  83 year old female with COPD, atrial fibrillation not on anticoagulation, status post pneumonia within the last 3 months who presents with cough, shortness of breath, fever. Clinically suspect healthcare acquired pneumonia. Will initiate IV fluids, IV antibiotics, DuoNeb, Solu-Medrol. Anticipate hospital admission.  Clinical Course as of Jan 19 650  Tue Jan 19, 2017  0650 Patient was admitted to the hospitalist service. Low dose Lopressor given for hypertension.  [JS]    Clinical Course User Index [JS] Paulette Blanch, MD     ____________________________________________   FINAL CLINICAL IMPRESSION(S) / ED DIAGNOSES  Final diagnoses:  Sepsis, due to unspecified organism ([place])  Hypoxia  HCAP (healthcare-associated pneumonia)  Essential hypertension  Persistent atrial fibrillation ([place])      NEW MEDICATIONS STARTED DURING THIS VISIT:  New Prescriptions   No medications on file     Note:  This document was prepared using Dragon voice recognition software and may include unintentional dictation  errors.    Paulette Blanch, MD 01/19/17 [phone]

## 2017-01-19 NOTE — Progress Notes (Signed)
Pharmacy Antibiotic Note  Janice Perkins is a 83 y.o. female admitted on 01/19/2017 with pneumonia.  Pharmacy has been consulted for vanc/cefepime dosing.  Plan: Patient received vanc 1g IV x 1 in ED  Will start vanc 1.25g IV q24h w/ 6 hour stack dose. Will check VT 6/8 @ 1100 prior to 4th dose. Goal trough 15 - 20 mcg/mL Ke 0.0347 T1/2 19 ~ 24 hours  Will initiate cefepime 2g IV q12h per CrCl 30 - 60 ml/min  Height: 5' (152.4 cm) Weight: 176 lb (79.8 kg) IBW/kg (Calculated) : 45.5  Temp (24hrs), Avg:99.2 F (37.3 C), Min:99.2 F (37.3 C), Max:99.2 F (37.3 C)   Recent Labs Lab 01/19/17 0509  WBC 10.5  CREATININE 1.11*  LATICACIDVEN 2.7*    Estimated Creatinine Clearance: 36.5 mL/min (A) (by C-G formula based on SCr of 1.11 mg/dL (H)).    Allergies  Allergen Reactions  . Penicillins Hives  . Captopril Other (See Comments)  . Enalapril Maleate     Other reaction(s): Unknown  . Seldane  [Terfenadine] Other (See Comments)  . Tape Itching  . Augmentin [Amoxicillin-Pot Clavulanate] Rash  . Biaxin [Clarithromycin] Rash, Other (See Comments) and Hives    Other reaction(s): Unknown     Thank you for allowing pharmacy to be a part of this patient's care.  Tobie Lords, PharmD, BCPS Clinical Pharmacist 01/19/2017

## 2017-01-19 NOTE — ED Notes (Signed)
Pt assisted to use bathroom. UA collected. Pt back to bed and back on monitor. Pt given cup of water.

## 2017-01-19 NOTE — H&P (Signed)
Janice Perkins is an 83 y.o. female.   Chief Complaint: Cough HPI: The patient with past medical history of COPD, hypertension and recent hospital admission and subsequent rehabilitation for pneumonia complains of cough. The patient was seen at urgent care earlier today for cough. She was prescribed doxycycline as well as some off medication. She received a breathing treatment but did not feel significantly better which prompted her to seek evaluation in the emergency department where chest x-ray showed pulmonary vascular congestion versus possible pneumonia. The patient was hypoxic to 88% on room air. She met criteria for sepsis which initiated fluid resuscitation protocol, blood cultures and broad-spectrum antibiotics. Patient was also found to be very hypertensive. Nitropaste was placed on her chest and the hospitalist service called for admission.  Past Medical History:  Diagnosis Date  . Asthma   . Breast cancer ([place]) 2011  . Bronchitis 04/2015  . Cancer [facility]) 2011   breast  . COPD (chronic obstructive pulmonary disease) ([place])   . Hypertension   . Shingles    10/2015    Past Surgical History:  Procedure Laterality Date  . BREAST EXCISIONAL BIOPSY Right 11/29/13   two areas  . BREAST EXCISIONAL BIOPSY Right 10/30/2009   lumpectomy - radiation    Family History  Problem Relation Age of Onset  . Hypertension Mother   . Heart disease Mother   . Cancer Mother   . Stroke Father   . Hypertension Father   . Breast cancer Sister    Social History:  reports that she has never smoked. She has never used smokeless tobacco. She reports that she does not drink alcohol or use drugs.  Allergies:  Allergies  Allergen Reactions  . Penicillins Hives  . Captopril Other (See Comments)  . Enalapril Maleate     Other reaction(s): Unknown  . Seldane  [Terfenadine] Other (See Comments)  . Tape Itching  . Augmentin [Amoxicillin-Pot Clavulanate] Rash  . Biaxin [Clarithromycin] Rash, Other (See  Comments) and Hives    Other reaction(s): Unknown    Prior to Admission medications   Medication Sig Start Date End Date Taking? Authorizing Provider  acetaminophen (TYLENOL) 325 MG tablet Take 2 tablets (650 mg total) by mouth every 6 (six) hours as needed for mild pain (or Fever >/= 101). 12/22/16  Yes Gouru, Illene Silver, MD  alendronate (FOSAMAX) 70 MG tablet Take 70 mg by mouth once a week. Take with a full glass of water on an empty stomach.   Yes [provider]  ALPRAZolam (XANAX) 0.25 MG tablet Take 1 tablet (0.25 mg total) by mouth 3 (three) times daily as needed for anxiety. 12/22/16  Yes Gouru, Illene Silver, MD  aspirin 81 MG tablet Take 81 mg by mouth daily.    Yes [provider]  atorvastatin (LIPITOR) 20 MG tablet Take 20 mg by mouth daily.   Yes [provider]  cholecalciferol (VITAMIN D) 1000 units tablet Take 1,000 Units by mouth daily.   Yes [provider]  docusate sodium (COLACE) 100 MG capsule Take 1 capsule (100 mg total) by mouth daily. 12/22/16  Yes Gouru, Illene Silver, MD  esomeprazole (NEXIUM) 40 MG capsule Take 40 mg by mouth daily at 12 noon.   Yes [provider]  fexofenadine (ALLEGRA) 180 MG tablet Take 180 mg by mouth daily.   Yes [provider]  FLUoxetine (PROZAC) 20 MG capsule Take 20 mg by mouth 2 (two) times daily.   Yes [provider]  fluticasone (FLONASE) 50 MCG/ACT  nasal spray Place 2 sprays into both nostrils daily.    Yes [provider]  Fluticasone-Salmeterol (ADVAIR) 250-50 MCG/DOSE AEPB Inhale 1 puff into the lungs 2 (two) times daily.   Yes [provider]  hydrALAZINE (APRESOLINE) 25 MG tablet Take 1 tablet (25 mg total) by mouth every 6 (six) hours. 12/22/16  Yes Gouru, Illene Silver, MD  hydrochlorothiazide (HYDRODIURIL) 25 MG tablet Take 1 tablet (25 mg total) by mouth daily. 12/23/16  Yes Gouru, Illene Silver, MD  levothyroxine (SYNTHROID, LEVOTHROID) 75 MCG tablet Take 1 tablet (75 mcg total) by mouth  daily before breakfast. 12/23/16  Yes Gouru, Aruna, MD  losartan (COZAAR) 100 MG tablet Take 1 tablet (100 mg total) by mouth daily. 12/23/16  Yes Gouru, Illene Silver, MD  magnesium oxide (MAG-OX) 400 MG tablet Take 400 mg by mouth 2 (two) times daily.   Yes [provider]  metoprolol succinate (TOPROL-XL) 100 MG 24 hr tablet Take 1 tablet (100 mg total) by mouth 2 (two) times daily with a meal. Take with or immediately following a meal. 12/22/16  Yes Gouru, Aruna, MD  QUEtiapine (SEROQUEL) 25 MG tablet Take 1 tablet (25 mg total) by mouth at bedtime. 12/22/16  Yes Gouru, Illene Silver, MD     Results for orders placed or performed during the hospital encounter of 01/19/17 (from the past 48 hour(s))  Blood gas, arterial     Status: Abnormal   Collection Time: 01/19/17  5:05 AM  Result Value Ref Range   FIO2 0.28    pH, Arterial 7.47 (H) 7.350 - 7.450   pCO2 arterial 38 32.0 - 48.0 mmHg   pO2, Arterial 66 (L) 83.0 - 108.0 mmHg   Bicarbonate 27.7 20.0 - 28.0 mmol/L   Acid-Base Excess 3.9 (H) 0.0 - 2.0 mmol/L   O2 Saturation 94.0 %   Patient temperature 37.0    Collection site LEFT RADIAL    Sample type ARTERIAL DRAW    Allens test (pass/fail) PASS PASS  CBC     Status: Abnormal   Collection Time: 01/19/17  5:09 AM  Result Value Ref Range   WBC 10.5 3.6 - 11.0 K/uL   RBC 4.44 3.80 - 5.20 MIL/uL   Hemoglobin 12.3 12.0 - 16.0 g/dL   HCT 36.6 35.0 - 47.0 %   MCV 82.5 80.0 - 100.0 fL   MCH 27.8 26.0 - 34.0 pg   MCHC 33.6 32.0 - 36.0 g/dL   RDW 16.5 (H) 11.5 - 14.5 %   Platelets 184 150 - 440 K/uL  Comprehensive metabolic panel     Status: Abnormal   Collection Time: 01/19/17  5:09 AM  Result Value Ref Range   Sodium 132 (L) 135 - 145 mmol/L   Potassium 3.6 3.5 - 5.1 mmol/L   Chloride 93 (L) 101 - 111 mmol/L   CO2 28 22 - 32 mmol/L   Glucose, Bld 133 (H) 65 - 99 mg/dL   BUN 15 6 - 20 mg/dL   Creatinine, Ser 1.11 (H) 0.44 - 1.00 mg/dL   Calcium 9.4 8.9 - 10.3 mg/dL   Total Protein 8.0 6.5 -  8.1 g/dL   Albumin 4.0 3.5 - 5.0 g/dL   AST 46 (H) 15 - 41 U/L   ALT 20 14 - 54 U/L   Alkaline Phosphatase 72 38 - 126 U/L   Total Bilirubin 1.3 (H) 0.3 - 1.2 mg/dL   GFR calc non Af Amer 45 (L) >60 mL/min   GFR calc Af Amer 52 (L) >60 mL/min  Comment: (NOTE) The eGFR has been calculated using the CKD EPI equation. This calculation has not been validated in all clinical situations. eGFR's persistently <60 mL/min signify possible Chronic Kidney Disease.    Anion gap 11 5 - 15  Lactic acid, plasma     Status: Abnormal   Collection Time: 01/19/17  5:09 AM  Result Value Ref Range   Lactic Acid, Venous 2.7 (HH) 0.5 - 1.9 mmol/L    Comment: CRITICAL RESULT CALLED TO, READ BACK BY AND VERIFIED WITH RACHEL HAYDEN AT 0552 ON 01/19/17 QSD   Troponin I     Status: None   Collection Time: 01/19/17  5:09 AM  Result Value Ref Range   Troponin I <0.03 <0.03 ng/mL  Brain natriuretic peptide     Status: Abnormal   Collection Time: 01/19/17  5:10 AM  Result Value Ref Range   B Natriuretic Peptide 827.0 (H) 0.0 - 100.0 pg/mL   Dg Chest 2 View  Result Date: 01/19/2017 CLINICAL DATA:  Shortness of breath.  Cough. EXAM: CHEST  2 VIEW COMPARISON:  Radiograph 12/19/2016 FINDINGS: Stable cardiomegaly and mediastinal contours. Atherosclerosis of the thoracic aorta. Decreased pulmonary edema from prior exam, there is persistent peribronchial cuffing. Trace right pleural effusion persists, improved from prior. No new focal airspace disease. No pneumothorax. Left shoulder arthroplasty partially included. IMPRESSION: Stable cardiomegaly and atherosclerosis of the thoracic aorta. Improved right pleural effusion with minimal residual blunting of costophrenic angle. Improved pulmonary edema. Mild persistent peribronchial cuffing may reflect persistent or recurrent edema or bronchitis. Electronically Signed   By: Jeb Levering M.D.   On: 01/19/2017 05:31    Review of Systems  Constitutional: Negative for chills  and fever.  HENT: Negative for sore throat and tinnitus.   Eyes: Negative for blurred vision and redness.  Respiratory: Positive for cough, shortness of breath and wheezing.   Cardiovascular: Negative for chest pain, palpitations, orthopnea and PND.  Gastrointestinal: Negative for abdominal pain, diarrhea, nausea and vomiting.  Genitourinary: Negative for dysuria, frequency and urgency.  Musculoskeletal: Negative for joint pain and myalgias.  Skin: Negative for rash.       No lesions  Neurological: Negative for speech change, focal weakness and weakness.  Endo/Heme/Allergies: Does not bruise/bleed easily.       No temperature intolerance  Psychiatric/Behavioral: Negative for depression and suicidal ideas.    Blood pressure (!) 178/91, pulse 97, temperature 99.2 F (37.3 C), temperature source Oral, resp. rate (!) 21, height 5' (1.524 m), weight 79.8 kg (176 lb), SpO2 98 %. Physical Exam  Nursing note and vitals reviewed. Constitutional: She is oriented to person, place, and time. She appears well-developed and well-nourished. No distress.  HENT:  Head: Normocephalic and atraumatic.  Mouth/Throat: Oropharynx is clear and moist.  Eyes: Conjunctivae and EOM are normal. Pupils are equal, round, and reactive to light. No scleral icterus.  Neck: Normal range of motion. Neck supple. No JVD present. No tracheal deviation present. No thyromegaly present.  Cardiovascular: Normal rate, regular rhythm and normal heart sounds.  Exam reveals no gallop and no friction rub.   No murmur heard. Respiratory: Effort normal. She has wheezes. She has rales.  GI: Soft. Bowel sounds are normal. She exhibits no distension. There is no tenderness.  Genitourinary:  Genitourinary Comments: Deferred  Musculoskeletal: Normal range of motion. She exhibits no edema.  Lymphadenopathy:    She has no cervical adenopathy.  Neurological: She is alert and oriented to person, place, and time. No cranial nerve deficit.  She  exhibits normal muscle tone.  Skin: Skin is warm and dry. No rash noted. No erythema.  Psychiatric: She has a normal mood and affect. Her behavior is normal. Judgment and thought content normal.     Assessment/Plan This is an 83 year old female admitted for sepsis. 1. Sepsis: The patient meets criteria via tachycardia and tachypnea. Blood cultures obtained and the patient was started on aztreonam as well as vancomycin. She is hemodynamically stable. Lactic acid is elevated. Gentle intravenous hydration. 2. HCAP: Hypoxia, tachypnea, age and acute kidney injury prompted admission. Antibiotics as above. 3. Acute kidney failure: Possibly secondary to uncontrolled hypertension and or decreased flow. Avoid nephrotoxic agents. Control blood pressure. 4. Pulmonary edema: Secondary to acute kidney failure. (Echocardiogram from last month normal). I will limit intravenous fluid resuscitation of sepsis protocol. I have ordered 1 dose of Lasix 40 mg IV. 5. Hypertension: Uncontrolled; continue hydrochlorothiazide, hydralazine and losartan. Nitropaste for now. Labetalol as needed. 6. Atrial fibrillation: Initially rate uncontrolled. Following some fluid resuscitation rate is improved. Continue metoprolol. Aspirin only for anticoagulation 7. COPD: Continue inhaled corticosteroid. DuoNeb's as needed 8. Hypothyroidism: Check TSH; continue Synthroid 9. Hyperlipidemia: Continue statin therapy 10. Depression: Continue Prozac 11. DVT prophylaxis: Heparin 12. GI prophylaxis: None The patient is a full code area time spent on admission orders and patient care approximately 45 minutes   Harrie Foreman, MD 01/19/2017, 6:56 AM

## 2017-01-20 LAB — CBC
HCT: 33.6 % — ABNORMAL LOW (ref 35.0–47.0)
Hemoglobin: 11.3 g/dL — ABNORMAL LOW (ref 12.0–16.0)
MCH: 27.2 pg (ref 26.0–34.0)
MCHC: 33.6 g/dL (ref 32.0–36.0)
MCV: 80.9 fL (ref 80.0–100.0)
PLATELETS: 149 10*3/uL — AB (ref 150–440)
RBC: 4.16 MIL/uL (ref 3.80–5.20)
RDW: 16.8 % — AB (ref 11.5–14.5)
WBC: 10 10*3/uL (ref 3.6–11.0)

## 2017-01-20 LAB — URINE CULTURE: Culture: NO GROWTH

## 2017-01-20 LAB — BASIC METABOLIC PANEL
ANION GAP: 10 (ref 5–15)
BUN: 21 mg/dL — AB (ref 6–20)
CALCIUM: 8.8 mg/dL — AB (ref 8.9–10.3)
CO2: 32 mmol/L (ref 22–32)
CREATININE: 1.09 mg/dL — AB (ref 0.44–1.00)
Chloride: 92 mmol/L — ABNORMAL LOW (ref 101–111)
GFR calc Af Amer: 53 mL/min — ABNORMAL LOW (ref >60)
GFR, EST NON AFRICAN AMERICAN: 46 mL/min — AB (ref >60)
GLUCOSE: 108 mg/dL — AB (ref 65–99)
Potassium: 2.8 mmol/L — ABNORMAL LOW (ref 3.5–5.1)
Sodium: 134 mmol/L — ABNORMAL LOW (ref 135–145)

## 2017-01-20 LAB — T4, FREE: FREE T4: 1.11 ng/dL (ref 0.61–1.12)

## 2017-01-20 MED ORDER — POTASSIUM CHLORIDE CRYS ER 20 MEQ PO TBCR
40.0000 meq | EXTENDED_RELEASE_TABLET | ORAL | Status: AC
  Administered 2017-01-20 (×2): 40 meq via ORAL
  Filled 2017-01-20 (×2): qty 2

## 2017-01-20 MED ORDER — DEXTROSE 5 % IV SOLN
2.0000 g | INTRAVENOUS | Status: DC
  Administered 2017-01-21: 05:00:00 2 g via INTRAVENOUS
  Filled 2017-01-20: qty 2

## 2017-01-20 MED ORDER — IPRATROPIUM-ALBUTEROL 0.5-2.5 (3) MG/3ML IN SOLN
3.0000 mL | Freq: Four times a day (QID) | RESPIRATORY_TRACT | Status: DC
  Administered 2017-01-20 – 2017-01-21 (×3): 3 mL via RESPIRATORY_TRACT
  Filled 2017-01-20 (×3): qty 3

## 2017-01-20 NOTE — Plan of Care (Signed)
Problem: Education: Goal: Knowledge of [facility] Education information/materials will improve Outcome: Progressing BP trending down, scheduled PO Hydralazine 25mg  held x2.  VSS otherwise, free of falls.  Denies pain.  Reported cough, received PRN Robitussin DM x1.  K 2.8, Dr. Marcille Blanco paged, no new orders.  No other needs overnight.  Pt reports being able to sleep during shift.  Bed in low position, bed alarm on.  Call bell within reach, [place].

## 2017-01-20 NOTE — Progress Notes (Signed)
PHARMACY NOTE:  ANTIMICROBIAL RENAL DOSAGE ADJUSTMENT  Current antimicrobial regimen includes a mismatch between antimicrobial dosage and estimated renal function.  As per policy approved by the Pharmacy & Therapeutics and Medical Executive Committees, the antimicrobial dosage will be adjusted accordingly.  Current antimicrobial dosage:  Cefepime 2g q 12 hrs  Indication: HCAP  Renal Function:  Estimated Creatinine Clearance: 36.2 mL/min (A) (by C-G formula based on SCr of 1.09 mg/dL (H)). []      On intermittent HD, scheduled: []      On CRRT    Antimicrobial dosage has been changed to:  Cefepime 2g q 24hr  Additional comments:   Thank you for allowing pharmacy to be a part of this patient's care.  Ramond Dial, Pharm.D, BCPS Clinical Pharmacist  01/20/2017 1:35 PM

## 2017-01-20 NOTE — Progress Notes (Signed)
[place] [facility] [place] NAME: Janice Perkins    MR#:  [id number]  DATE OF BIRTH:  04/13/1933  SUBJECTIVE:  CHIEF COMPLAINT:  Still have some SOB.  REVIEW OF SYSTEMS:  CONSTITUTIONAL: No fever, fatigue or weakness.  EYES: No blurred or double vision.  EARS, NOSE, AND THROAT: No tinnitus or ear pain.  RESPIRATORY: No cough, shortness of breath, wheezing or hemoptysis.  CARDIOVASCULAR: No chest pain, orthopnea, edema.  GASTROINTESTINAL: No nausea, vomiting, diarrhea or abdominal pain.  GENITOURINARY: No dysuria, hematuria.  ENDOCRINE: No polyuria, nocturia,  HEMATOLOGY: No anemia, easy bruising or bleeding SKIN: No rash or lesion. MUSCULOSKELETAL: No joint pain or arthritis.   NEUROLOGIC: No tingling, numbness, weakness.  PSYCHIATRY: No anxiety or depression.   DRUG ALLERGIES:   Allergies  Allergen Reactions  . Penicillins Hives  . Captopril Other (See Comments)  . Enalapril Maleate     Other reaction(s): Unknown  . Seldane  [Terfenadine] Other (See Comments)  . Tape Itching  . Augmentin [Amoxicillin-Pot Clavulanate] Rash  . Biaxin [Clarithromycin] Rash, Other (See Comments) and Hives    Other reaction(s): Unknown    VITALS:  Blood pressure 129/77, pulse 76, temperature 98.7 F (37.1 C), temperature source Oral, resp. rate 18, height 4\' 11"  (1.499 m), weight 79.5 kg (175 lb 3.2 oz), SpO2 97 %.  PHYSICAL EXAMINATION:  GENERAL:  83 y.o.-year-old patient lying in the bed with no acute distress.  EYES: Pupils equal, round, reactive to light and accommodation. No scleral icterus. Extraocular muscles intact.  HEENT: Head atraumatic, normocephalic. Oropharynx and nasopharynx clear.  NECK:  Supple, no jugular venous distention. No thyroid enlargement, no tenderness.  LUNGS: Normal breath sounds bilaterally, no wheezing, some crepitation. No use of accessory muscles of respiration.  CARDIOVASCULAR: S1, S2 normal. No murmurs, rubs, or  gallops.  ABDOMEN: Soft, nontender, nondistended. Bowel sounds present. No organomegaly or mass.  EXTREMITIES: No pedal edema, cyanosis, or clubbing.  NEUROLOGIC: Cranial nerves II through XII are intact. Muscle strength 4/5 in all extremities. Sensation intact. Gait not checked.  PSYCHIATRIC: The patient is alert and oriented x 3.  SKIN: No obvious rash, lesion, or ulcer.    LABORATORY PANEL:   CBC  Recent Labs Lab 01/20/17 0343  WBC 10.0  HGB 11.3*  HCT 33.6*  PLT 149*   ------------------------------------------------------------------------------------------------------------------  Chemistries   Recent Labs Lab 01/19/17 0509 01/20/17 0343  NA 132* 134*  K 3.6 2.8*  CL 93* 92*  CO2 28 32  GLUCOSE 133* 108*  BUN 15 21*  CREATININE 1.11* 1.09*  CALCIUM 9.4 8.8*  AST 46*  --   ALT 20  --   ALKPHOS 72  --   BILITOT 1.3*  --    ------------------------------------------------------------------------------------------------------------------  Cardiac Enzymes  Recent Labs Lab 01/19/17 0509  TROPONINI <0.03   ------------------------------------------------------------------------------------------------------------------  RADIOLOGY:  Dg Chest 2 View  Result Date: 01/19/2017 CLINICAL DATA:  Shortness of breath.  Cough. EXAM: CHEST  2 VIEW COMPARISON:  Radiograph 12/19/2016 FINDINGS: Stable cardiomegaly and mediastinal contours. Atherosclerosis of the thoracic aorta. Decreased pulmonary edema from prior exam, there is persistent peribronchial cuffing. Trace right pleural effusion persists, improved from prior. No new focal airspace disease. No pneumothorax. Left shoulder arthroplasty partially included. IMPRESSION: Stable cardiomegaly and atherosclerosis of the thoracic aorta. Improved right pleural effusion with minimal residual blunting of costophrenic angle. Improved pulmonary edema. Mild persistent peribronchial cuffing may reflect persistent or recurrent edema or  bronchitis. Electronically Signed   By:  Jeb Levering M.D.   On: 01/19/2017 05:31    EKG:   Orders placed or performed during the hospital encounter of 01/19/17  . ED EKG 12-Lead  . ED EKG 12-Lead    ASSESSMENT AND PLAN:   This is an 83 year old female admitted for sepsis. # Sepsis: The patient meets criteria via tachycardia and tachypnea. Follow-up on the blood cultures Lactic acid normalized to 2.7-1.5 Continue IV antibiotics Fluids were given in the ED, patient is not currently  fluid overloade   # HCAP: Hypoxia, tachypnea, age and acute kidney injury prompted admission.  IV cefepime  and vancomycin  # Acute kidney failure:  Renal func is at baseline.  #Pulmonary edema: Echocardiogram from last month normal.    Resolved now.  #Hypertension: Uncontrolled; continue hydrochlorothiazide, hydralazine and losartan. Nitropaste for now. Labetalol as needed.  #. Atrial fibrillation:  rate uncontrolled. Following some fluid resuscitation rate is improved. Continue metoprolol. Aspirin only for anticoagulation  7. COPD: Continue inhaled corticosteroid. DuoNeb's as needed  8. Hypothyroidism: TSH elevated at 8.33. Free T4 normal,  continue Synthroid  9. Hyperlipidemia: Continue statin therapy 10. Depression: Continue Prozac    DVT prophylaxis: Heparin GI prophylaxis: None     All the records are reviewed and case discussed with Care Management/Social Workerr. Management plans discussed with the patient, family and they are in agreement.  CODE STATUS: fc   TOTAL TIME TAKING CARE OF THIS PATIENT: 35  minutes.   POSSIBLE D/C IN 1-2 DAYS, DEPENDING ON CLINICAL CONDITION.  Note: This dictation was prepared with Dragon dictation along with smaller phrase technology. Any transcriptional errors that result from this process are unintentional.   Vaughan Basta M.D on 01/20/2017 at 5:18 PM  Between 7am to 6pm - Pager - [phone] After 6pm go to [email or web address] -  password [email or web address] [facility]  Office  [phone]  CC: Primary care physician; [name], MD

## 2017-01-20 NOTE — Evaluation (Signed)
Physical Therapy Evaluation Patient Details Name: Janice Perkins MRN: [id number] DOB: 22-Aug-1932 Today's Date: 01/20/2017   History of Present Illness  Pt is a 83 y/o F who presents with cough and SOB.  Pt met criteria for sepsis.  Pt with pulmonary edema, HCAP, and a-fib.  Pt's PMH includes breast cancer, COPD, shingles, lumbar laminectomy due to spinal stenosis.    Clinical Impression  Pt admitted with above diagnosis. Pt currently with functional limitations due to the deficits listed below (see PT Problem List). Janice Perkins currently is independent with bed mobility and transfers.  She demonstrates mild instability with horizontal head turns when ambulating and demonstrates some balance impairments, as evidenced by the Berg and DGI.   Pt will benefit from skilled PT to increase their independence and safety with mobility to allow discharge to the venue listed below.      Follow Up Recommendations Home health PT    Equipment Recommendations  None recommended by PT    Recommendations for Other Services       Precautions / Restrictions Precautions Precautions: Fall;Other (comment) Precaution Comments: Monitor O2 and HR Restrictions Weight Bearing Restrictions: No      Mobility  Bed Mobility Overal bed mobility: Independent             General bed mobility comments: No physical assist or cues needed.  Pt performs independently.  Transfers Overall transfer level: Independent Equipment used: None             General transfer comment: No instability noted.  Proper and safe technique.  Ambulation/Gait Ambulation/Gait assistance: Min guard Ambulation Distance (Feet): 215 Feet Assistive device: None Gait Pattern/deviations: Staggering right;Narrow base of support;Step-through pattern   Gait velocity interpretation: at or above normal speed for age/gender General Gait Details: Pt with steady gait for majority of ambulation; however, she does lose her balance x2 to the R  when turning to look at this therapist to answer a question.  Pt able to steady without outside physical assist.  SpO2 remains at or above 92% on RA.  Stairs            Wheelchair Mobility    Modified Rankin (Stroke Patients Only)       Balance Overall balance assessment: Modified Independent (Mild instability noted with dynamic activity)                               Standardized Balance Assessment Standardized Balance Assessment : Dynamic Gait Index;Berg Balance Test Berg Balance Test Sit to Stand: Able to stand without using hands and stabilize independently Standing Unsupported: Able to stand safely 2 minutes Sitting with Back Unsupported but Feet Supported on Floor or Stool: Able to sit safely and securely 2 minutes Stand to Sit: Sits safely with minimal use of hands Transfers: Able to transfer safely, minor use of hands Standing Unsupported with Eyes Closed: Able to stand 10 seconds with supervision Standing Ubsupported with Feet Together: Able to place feet together independently and stand 1 minute safely From Standing, Reach Forward with Outstretched Arm: Can reach forward >12 cm safely (5") From Standing Position, Pick up Object from Floor:  (Unable to perform due to back precautions per pt) From Standing Position, Turn to Look Behind Over each Shoulder: Looks behind from both sides and weight shifts well Turn 360 Degrees: Able to turn 360 degrees safely in 4 seconds or less Standing Unsupported, Alternately Place Feet on Step/Stool: Able to  stand independently and safely and complete 8 steps in 20 seconds Standing Unsupported, One Foot in Front: Able to take small step independently and hold 30 seconds Standing on One Leg: Tries to lift leg/unable to hold 3 seconds but remains standing independently Dynamic Gait Index Level Surface: Normal Change in Gait Speed: Normal Gait with Horizontal Head Turns: Mild Impairment Gait with Vertical Head Turns:  Normal Gait and Pivot Turn: Normal Step Over Obstacle: Normal Step Around Obstacles: Normal       Pertinent Vitals/Pain Pain Assessment: Faces Faces Pain Scale: Hurts a little bit Pain Location: buttocks, from lying in the same position in bed all day Pain Descriptors / Indicators: Sore Pain Intervention(s): Limited activity within patient's tolerance;Monitored during session;Repositioned    Home Living Family/patient expects to be discharged to:: Private residence Living Arrangements: Spouse/significant other;Children Available Help at Discharge: Family;Available PRN/intermittently Type of Home: House Home Access: Level entry     Home Layout: One level Home Equipment: Walker - 2 wheels;Tub bench;Bedside commode;Grab bars - tub/shower      Prior Function Level of Independence: Independent         Comments: Pt ind with bathing, dressing, cooking.  She denies any falls in the past 6 months.       Hand Dominance        Extremity/Trunk Assessment   Upper Extremity Assessment Upper Extremity Assessment:  (BUE strength grossly 4/5)    Lower Extremity Assessment Lower Extremity Assessment:  (BLE strength grossly 4/5)    Cervical / Trunk Assessment Cervical / Trunk Assessment: Other exceptions Cervical / Trunk Exceptions: Pt s/p lumbar laminectomy 10/2016.  Communication   Communication: No difficulties  Cognition Arousal/Alertness: Awake/alert Behavior During Therapy: WFL for tasks assessed/performed Overall Cognitive Status: No family/caregiver present to determine baseline cognitive functioning                                 General Comments: Pt oriented but does appear to have some memory deficits.  She reports she has been working with Speech Therapy which seems, from her description, to be for cognition.        General Comments      Exercises Other Exercises Other Exercises: Encouraged pt to ambulate with nursing staff at least 3x/day.    Assessment/Plan    PT Assessment Patient needs continued PT services  PT Problem List Decreased strength;Decreased balance;Decreased cognition;Decreased safety awareness;Cardiopulmonary status limiting activity       PT Treatment Interventions DME instruction;Gait training;Functional mobility training;Therapeutic activities;Therapeutic exercise;Balance training;Neuromuscular re-education;Cognitive remediation;Patient/family education    PT Goals (Current goals can be found in the Care Plan section)  Acute Rehab PT Goals Patient Stated Goal: to go home PT Goal Formulation: With patient Time For Goal Achievement: 02/03/17 Potential to Achieve Goals: Good    Frequency Min 2X/week   Barriers to discharge        Co-evaluation               AM-PAC PT "6 Clicks" Daily Activity  Outcome Measure Difficulty turning over in bed (including adjusting bedclothes, sheets and blankets)?: None Difficulty moving from lying on back to sitting on the side of the bed? : None Difficulty sitting down on and standing up from a chair with arms (e.g., wheelchair, bedside commode, etc,.)?: None Help needed moving to and from a bed to chair (including a wheelchair)?: A Little Help needed walking in hospital room?: A Little Help needed  climbing 3-5 steps with a railing? : A Little 6 Click Score: 21    End of Session Equipment Utilized During Treatment: Gait belt Activity Tolerance: Patient tolerated treatment well Patient left: in chair;with call bell/phone within reach;with chair alarm set Nurse Communication: Mobility status;Other (comment) (SpO2) PT Visit Diagnosis: Unsteadiness on feet (R26.81);Other abnormalities of gait and mobility (R26.89)    Time: 4627-0350 PT Time Calculation (min) (ACUTE ONLY): 27 min   Charges:   PT Evaluation $PT Eval Low Complexity: 1 Procedure PT Treatments $Gait Training: 8-22 mins   PT G Codes:        Collie Siad PT, DPT 01/20/2017, 4:22  PM

## 2017-01-21 LAB — BASIC METABOLIC PANEL
Anion gap: 8 (ref 5–15)
BUN: 24 mg/dL — ABNORMAL HIGH (ref 6–20)
CALCIUM: 9.2 mg/dL (ref 8.9–10.3)
CO2: 29 mmol/L (ref 22–32)
CREATININE: 1.18 mg/dL — AB (ref 0.44–1.00)
Chloride: 93 mmol/L — ABNORMAL LOW (ref 101–111)
GFR calc Af Amer: 48 mL/min — ABNORMAL LOW (ref >60)
GFR calc non Af Amer: 42 mL/min — ABNORMAL LOW (ref >60)
GLUCOSE: 128 mg/dL — AB (ref 65–99)
Potassium: 3.5 mmol/L (ref 3.5–5.1)
Sodium: 130 mmol/L — ABNORMAL LOW (ref 135–145)

## 2017-01-21 LAB — T3: T3, Total: 68 ng/dL — ABNORMAL LOW (ref 71–180)

## 2017-01-21 MED ORDER — ONDANSETRON 4 MG PO TBDP: 4.0000 mg | ORAL_TABLET | Freq: Three times a day (TID) | ORAL | 0 refills | Status: DC | PRN

## 2017-01-21 MED ORDER — CEFUROXIME AXETIL 250 MG PO TABS: 250.0000 mg | ORAL_TABLET | Freq: Two times a day (BID) | ORAL | 0 refills | Status: AC

## 2017-01-21 NOTE — Progress Notes (Signed)
Pt needs O2 at night due to dropping sats.  Vitals stable.

## 2017-01-21 NOTE — Discharge Summary (Addendum)
[facility] NAME: Margareta Laureano    MR#:  [id number]  DATE OF BIRTH:  08/05/1933  DATE OF ADMISSION:  01/19/2017 ADMITTING PHYSICIAN: Harrie Foreman, MD  DATE OF DISCHARGE: 01/21/2017  PRIMARY CARE PHYSICIAN: [name], MD    ADMISSION DIAGNOSIS:  Hypoxia [R09.02] Persistent atrial fibrillation ([place]) [I48.1] HCAP (healthcare-associated pneumonia) [J18.9] Essential hypertension [I10] Sepsis, due to unspecified organism ([place]) [A41.9]  DISCHARGE DIAGNOSIS:  Active Problems:   HCAP (healthcare-associated pneumonia)   SECONDARY DIAGNOSIS:   Past Medical History:  Diagnosis Date  . Asthma   . Breast cancer ([place]) 2011  . Bronchitis 04/2015  . Cancer [facility]) 2011   breast  . COPD (chronic obstructive pulmonary disease) ([place])   . Hypertension   . Shingles    10/2015    HOSPITAL COURSE:   This is an 83 year old female admitted for sepsis.  # Sepsis: The patient meets criteria via tachycardia and tachypnea. Follow-up on the blood cultures Lactic acid normalized to 2.7-1.5 Continue IV antibiotics Fluids were given in the ED, patient is not currently  fluid overloaded.   # HCAP: Hypoxia, tachypnea, age and acute kidney injury prompted admission.  IV cefepime  and vancomycin   MRSA PCR is negative.   Give cefuroxime on d/c.  # Acute kidney failure:  Renal func is at baseline.  #Pulmonary edema: Echocardiogram from last month normal.    Resolved now.  #Hypertension: Uncontrolled; continue hydrochlorothiazide, hydralazine and losartan. Nitropaste for now. Labetalol as needed.  #. Atrial fibrillation:  rate uncontrolled. Following some fluid resuscitation rate is improved. Continue metoprolol. Aspirin only for anticoagulation  # COPD: Continue inhaled corticosteroid. DuoNeb's as needed  # Hypothyroidism: TSH elevated at 8.33. Free T4 normal,  continue Synthroid  9. Hyperlipidemia: Continue statin  therapy 10. Depression: Continue Prozac  DISCHARGE CONDITIONS:   Stable.  CONSULTS OBTAINED:    DRUG ALLERGIES:   Allergies  Allergen Reactions  . Penicillins Hives  . Captopril Other (See Comments)  . Enalapril Maleate     Other reaction(s): Unknown  . Seldane  [Terfenadine] Other (See Comments)  . Tape Itching  . Augmentin [Amoxicillin-Pot Clavulanate] Rash  . Biaxin [Clarithromycin] Rash, Other (See Comments) and Hives    Other reaction(s): Unknown    DISCHARGE MEDICATIONS:   Current Discharge Medication List    START taking these medications   Details  cefUROXime (CEFTIN) 250 MG tablet Take 1 tablet (250 mg total) by mouth 2 (two) times daily. Qty: 8 tablet, Refills: 0    ondansetron (ZOFRAN ODT) 4 MG disintegrating tablet Take 1 tablet (4 mg total) by mouth every 8 (eight) hours as needed for nausea or vomiting. Qty: 20 tablet, Refills: 0      CONTINUE these medications which have NOT CHANGED   Details  acetaminophen (TYLENOL) 325 MG tablet Take 2 tablets (650 mg total) by mouth every 6 (six) hours as needed for mild pain (or Fever >/= 101).    alendronate (FOSAMAX) 70 MG tablet Take 70 mg by mouth once a week. Take with a full glass of water on an empty stomach.    ALPRAZolam (XANAX) 0.25 MG tablet Take 1 tablet (0.25 mg total) by mouth 3 (three) times daily as needed for anxiety. Qty: 15 tablet, Refills: 0    aspirin 81 MG tablet Take 81 mg by mouth daily.     atorvastatin (LIPITOR) 20 MG tablet Take 20 mg by mouth daily.    cholecalciferol (VITAMIN  D) 1000 units tablet Take 1,000 Units by mouth daily.    docusate sodium (COLACE) 100 MG capsule Take 1 capsule (100 mg total) by mouth daily. Qty: 10 capsule, Refills: 0    esomeprazole (NEXIUM) 40 MG capsule Take 40 mg by mouth daily at 12 noon.    fexofenadine (ALLEGRA) 180 MG tablet Take 180 mg by mouth daily.    FLUoxetine (PROZAC) 20 MG capsule Take 20 mg by mouth 2 (two) times daily.     fluticasone (FLONASE) 50 MCG/ACT nasal spray Place 2 sprays into both nostrils daily.     Fluticasone-Salmeterol (ADVAIR) 250-50 MCG/DOSE AEPB Inhale 1 puff into the lungs 2 (two) times daily.    hydrALAZINE (APRESOLINE) 25 MG tablet Take 1 tablet (25 mg total) by mouth every 6 (six) hours.    hydrochlorothiazide (HYDRODIURIL) 25 MG tablet Take 1 tablet (25 mg total) by mouth daily.    levothyroxine (SYNTHROID, LEVOTHROID) 75 MCG tablet Take 1 tablet (75 mcg total) by mouth daily before breakfast.    losartan (COZAAR) 100 MG tablet Take 1 tablet (100 mg total) by mouth daily.    magnesium oxide (MAG-OX) 400 MG tablet Take 400 mg by mouth 2 (two) times daily.    metoprolol succinate (TOPROL-XL) 100 MG 24 hr tablet Take 1 tablet (100 mg total) by mouth 2 (two) times daily with a meal. Take with or immediately following a meal.    QUEtiapine (SEROQUEL) 25 MG tablet Take 1 tablet (25 mg total) by mouth at bedtime.         DISCHARGE INSTRUCTIONS:    Follow with PMD in 1 week.  If you experience worsening of your admission symptoms, develop shortness of breath, life threatening emergency, suicidal or homicidal thoughts you must seek medical attention immediately by calling 911 or calling your MD immediately  if symptoms less severe.  You Must read complete instructions/literature along with all the possible adverse reactions/side effects for all the Medicines you take and that have been prescribed to you. Take any new Medicines after you have completely understood and accept all the possible adverse reactions/side effects.   Please note  You were cared for by a hospitalist during your hospital stay. If you have any questions about your discharge medications or the care you received while you were in the hospital after you are discharged, you can call the unit and asked to speak with the hospitalist on call if the hospitalist that took care of you is not available. Once you are  discharged, your primary care physician will handle any further medical issues. Please note that NO REFILLS for any discharge medications will be authorized once you are discharged, as it is imperative that you return to your primary care physician (or establish a relationship with a primary care physician if you do not have one) for your aftercare needs so that they can reassess your need for medications and monitor your lab values.    Today   CHIEF COMPLAINT:   Chief Complaint  Patient presents with  . Cough    HISTORY OF PRESENT ILLNESS:  Janice Perkins  is a 83 y.o. female with a known history of COPD, hypertension and recent hospital admission and subsequent rehabilitation for pneumonia complains of cough. The patient was seen at urgent care earlier today for cough. She was prescribed doxycycline as well as some off medication. She received a breathing treatment but did not feel significantly better which prompted her to seek evaluation in the emergency department where  chest x-ray showed pulmonary vascular congestion versus possible pneumonia. The patient was hypoxic to 88% on room air. She met criteria for sepsis which initiated fluid resuscitation protocol, blood cultures and broad-spectrum antibiotics. Patient was also found to be very hypertensive. Nitropaste was placed on her chest and the hospitalist service called for admission.   VITAL SIGNS:  Blood pressure 128/85, pulse 71, temperature 97.5 F (36.4 C), temperature source Oral, resp. rate 20, height 4' 11"  (1.499 m), weight 79.4 kg (175 lb), SpO2 94 %.  I/O:    Intake/Output Summary (Last 24 hours) at 01/21/17 0852 Last data filed at 01/21/17 0529  Gross per 24 hour  Intake              530 ml  Output                0 ml  Net              530 ml    PHYSICAL EXAMINATION:  GENERAL:  83 y.o.-year-old patient lying in the bed with no acute distress.  EYES: Pupils equal, round, reactive to light and accommodation. No  scleral icterus. Extraocular muscles intact.  HEENT: Head atraumatic, normocephalic. Oropharynx and nasopharynx clear.  NECK:  Supple, no jugular venous distention. No thyroid enlargement, no tenderness.  LUNGS: Normal breath sounds bilaterally, no wheezing, some crepitation. No use of accessory muscles of respiration.  CARDIOVASCULAR: S1, S2 normal. No murmurs, rubs, or gallops.  ABDOMEN: Soft, nontender, nondistended. Bowel sounds present. No organomegaly or mass.  EXTREMITIES: No pedal edema, cyanosis, or clubbing.  NEUROLOGIC: Cranial nerves II through XII are intact. Muscle strength 4/5 in all extremities. Sensation intact. Gait not checked.  PSYCHIATRIC: The patient is alert and oriented x 3.  SKIN: No obvious rash, lesion, or ulcer.   DATA REVIEW:   CBC  Recent Labs Lab 01/20/17 0343  WBC 10.0  HGB 11.3*  HCT 33.6*  PLT 149*    Chemistries   Recent Labs Lab 01/19/17 0509  01/21/17 0457  NA 132*  < > 130*  K 3.6  < > 3.5  CL 93*  < > 93*  CO2 28  < > 29  GLUCOSE 133*  < > 128*  BUN 15  < > 24*  CREATININE 1.11*  < > 1.18*  CALCIUM 9.4  < > 9.2  AST 46*  --   --   ALT 20  --   --   ALKPHOS 72  --   --   BILITOT 1.3*  --   --   < > = values in this interval not displayed.  Cardiac Enzymes  Recent Labs Lab 01/19/17 0509  TROPONINI <0.03    Microbiology Results  Results for orders placed or performed during the hospital encounter of 01/19/17  Blood Culture (routine x 2)     Status: None (Preliminary result)   Collection Time: 01/19/17  5:09 AM  Result Value Ref Range Status   Specimen Description BLOOD R FA  Final   Special Requests   Final    BOTTLES DRAWN AEROBIC AND ANAEROBIC Blood Culture adequate volume   Culture NO GROWTH 2 DAYS  Final   Report Status PENDING  Incomplete  Blood Culture (routine x 2)     Status: None (Preliminary result)   Collection Time: 01/19/17  5:10 AM  Result Value Ref Range Status   Specimen Description BLOOD L AC  Final    Special Requests   Final    BOTTLES  DRAWN AEROBIC AND ANAEROBIC Blood Culture results may not be optimal due to an inadequate volume of blood received in culture bottles   Culture NO GROWTH 2 DAYS  Final   Report Status PENDING  Incomplete  Urine culture     Status: None   Collection Time: 01/19/17  7:36 AM  Result Value Ref Range Status   Specimen Description URINE, RANDOM  Final   Special Requests NONE  Final   Culture   Final    NO GROWTH Performed at [facility], [address] [place], [place] [address]    Report Status 01/20/2017 FINAL  Final    RADIOLOGY:  No results found.  EKG:   Orders placed or performed during the hospital encounter of 01/19/17  . ED EKG 12-Lead  . ED EKG 12-Lead      Management plans discussed with the patient, family and they are in agreement.  CODE STATUS:     Code Status Orders        Start     Ordered   01/19/17 0838  Full code  Continuous     01/19/17 0837    Code Status History    Date Active Date Inactive Code Status Order ID Comments User Context   12/18/2016  7:18 AM 12/22/2016  7:47 PM Full Code [id number]  Harrie Foreman, MD Inpatient      TOTAL TIME TAKING CARE OF THIS PATIENT: 35 minutes.    Vaughan Basta M.D on 01/21/2017 at 8:52 AM  Between 7am to 6pm - Pager - [phone]  After 6pm go to [email or web address] - password [email or web address] [facility]  Office  [phone]  CC: Primary care physician; [name], MD   Note: This dictation was prepared with Dragon dictation along with smaller phrase technology. Any transcriptional errors that result from this process are unintentional.

## 2017-01-21 NOTE — Care Management (Signed)
Admitted to [facility] with the diagnosis of pneumonia. Lives with husband, [name] [phone]). Last seen Dr, Ginette Pitman 01/18/17. Discharged from this facility 12/22/16 to [facility]. Discharged from [facility] last week. Followed by [facility] for SN, OT,PT, and speech.  Discharge to home today per Dr. Anselm Jungling. Will resume the home health orders per [place]. Shelbie Ammons RN MSN CCM Care Management [phone]

## 2017-01-21 NOTE — Progress Notes (Signed)
Discharge instructions and prescriptions given to patient with verbalized understanding.  IV's removed per policy.  Patient taken out by staff to visitors entrance via wheelchair to be taken home in personal vehicle by husband.

## 2017-01-24 LAB — CULTURE, BLOOD (ROUTINE X 2)
CULTURE: NO GROWTH
Culture: NO GROWTH
Special Requests: ADEQUATE

## 2017-03-07 ENCOUNTER — Emergency Department: Payer: Medicare Other

## 2017-03-07 ENCOUNTER — Encounter: Payer: Self-pay | Admitting: Emergency Medicine

## 2017-03-07 ENCOUNTER — Inpatient Hospital Stay
Admission: EM | Admit: 2017-03-07 | Discharge: 2017-03-09 | DRG: 641 | Disposition: A | Discharge status: Home Health | Payer: Medicare Other | Attending: Internal Medicine | Admitting: Internal Medicine

## 2017-03-07 DIAGNOSIS — Z7951 Long term (current) use of inhaled steroids: Secondary | ICD-10-CM

## 2017-03-07 DIAGNOSIS — R531 Weakness: Secondary | ICD-10-CM

## 2017-03-07 DIAGNOSIS — J449 Chronic obstructive pulmonary disease, unspecified: Secondary | ICD-10-CM | POA: Diagnosis present

## 2017-03-07 DIAGNOSIS — R5383 Other fatigue: Secondary | ICD-10-CM

## 2017-03-07 DIAGNOSIS — Z79899 Other long term (current) drug therapy: Secondary | ICD-10-CM

## 2017-03-07 DIAGNOSIS — N390 Urinary tract infection, site not specified: Secondary | ICD-10-CM

## 2017-03-07 DIAGNOSIS — Z7982 Long term (current) use of aspirin: Secondary | ICD-10-CM

## 2017-03-07 DIAGNOSIS — E860 Dehydration: Secondary | ICD-10-CM

## 2017-03-07 DIAGNOSIS — Z888 Allergy status to other drugs, medicaments and biological substances status: Secondary | ICD-10-CM

## 2017-03-07 DIAGNOSIS — Z880 Allergy status to penicillin: Secondary | ICD-10-CM

## 2017-03-07 DIAGNOSIS — T502X5A Adverse effect of carbonic-anhydrase inhibitors, benzothiadiazides and other diuretics, initial encounter: Secondary | ICD-10-CM | POA: Diagnosis present

## 2017-03-07 DIAGNOSIS — Z881 Allergy status to other antibiotic agents status: Secondary | ICD-10-CM

## 2017-03-07 DIAGNOSIS — B09 Unspecified viral infection characterized by skin and mucous membrane lesions: Secondary | ICD-10-CM | POA: Diagnosis present

## 2017-03-07 DIAGNOSIS — E785 Hyperlipidemia, unspecified: Secondary | ICD-10-CM | POA: Diagnosis present

## 2017-03-07 DIAGNOSIS — Z853 Personal history of malignant neoplasm of breast: Secondary | ICD-10-CM

## 2017-03-07 DIAGNOSIS — K5900 Constipation, unspecified: Secondary | ICD-10-CM | POA: Diagnosis present

## 2017-03-07 DIAGNOSIS — E871 Hypo-osmolality and hyponatremia: Principal | ICD-10-CM

## 2017-03-07 DIAGNOSIS — E039 Hypothyroidism, unspecified: Secondary | ICD-10-CM | POA: Diagnosis present

## 2017-03-07 DIAGNOSIS — I10 Essential (primary) hypertension: Secondary | ICD-10-CM | POA: Diagnosis present

## 2017-03-07 DIAGNOSIS — I482 Chronic atrial fibrillation: Secondary | ICD-10-CM | POA: Diagnosis present

## 2017-03-07 DIAGNOSIS — F329 Major depressive disorder, single episode, unspecified: Secondary | ICD-10-CM | POA: Diagnosis present

## 2017-03-07 LAB — CBC
HCT: 35.1 % (ref 35.0–47.0)
HEMOGLOBIN: 11.8 g/dL — AB (ref 12.0–16.0)
MCH: 26.8 pg (ref 26.0–34.0)
MCHC: 33.7 g/dL (ref 32.0–36.0)
MCV: 79.4 fL — ABNORMAL LOW (ref 80.0–100.0)
PLATELETS: 206 10*3/uL (ref 150–440)
RBC: 4.42 MIL/uL (ref 3.80–5.20)
RDW: 17.5 % — AB (ref 11.5–14.5)
WBC: 11.4 10*3/uL — ABNORMAL HIGH (ref 3.6–11.0)

## 2017-03-07 LAB — BASIC METABOLIC PANEL
Anion gap: 10 (ref 5–15)
BUN: 14 mg/dL (ref 6–20)
CALCIUM: 9.1 mg/dL (ref 8.9–10.3)
CO2: 26 mmol/L (ref 22–32)
CREATININE: 0.79 mg/dL (ref 0.44–1.00)
Chloride: 89 mmol/L — ABNORMAL LOW (ref 101–111)
GFR calc Af Amer: >60 mL/min (ref >60)
Glucose, Bld: 115 mg/dL — ABNORMAL HIGH (ref 65–99)
Potassium: 3.2 mmol/L — ABNORMAL LOW (ref 3.5–5.1)
Sodium: 125 mmol/L — ABNORMAL LOW (ref 135–145)

## 2017-03-07 LAB — URINALYSIS, COMPLETE (UACMP) WITH MICROSCOPIC
Bilirubin Urine: NEGATIVE
GLUCOSE, UA: NEGATIVE mg/dL
Hgb urine dipstick: NEGATIVE
Ketones, ur: NEGATIVE mg/dL
Nitrite: NEGATIVE
PROTEIN: NEGATIVE mg/dL
SPECIFIC GRAVITY, URINE: 1.012 (ref 1.005–1.030)
pH: 6 (ref 5.0–8.0)

## 2017-03-07 LAB — TROPONIN I: Troponin I: <0.03 ng/mL (ref <0.03)

## 2017-03-07 LAB — TSH: TSH: 1.893 u[IU]/mL (ref 0.350–4.500)

## 2017-03-07 MED ORDER — ASPIRIN EC 81 MG PO TBEC
81.0000 mg | DELAYED_RELEASE_TABLET | Freq: Every day | ORAL | Status: DC
  Administered 2017-03-08 – 2017-03-09 (×2): 81 mg via ORAL
  Filled 2017-03-07 (×2): qty 1

## 2017-03-07 MED ORDER — ONDANSETRON HCL 4 MG/2ML IJ SOLN
4.0000 mg | Freq: Once | INTRAMUSCULAR | Status: AC
  Administered 2017-03-07: 4 mg via INTRAVENOUS
  Filled 2017-03-07: qty 2

## 2017-03-07 MED ORDER — LEVOTHYROXINE SODIUM 50 MCG PO TABS
75.0000 ug | ORAL_TABLET | Freq: Every day | ORAL | Status: DC
  Administered 2017-03-08 – 2017-03-09 (×2): 75 ug via ORAL
  Filled 2017-03-07 (×2): qty 1

## 2017-03-07 MED ORDER — HEPARIN SODIUM (PORCINE) 5000 UNIT/ML IJ SOLN
5000.0000 [IU] | Freq: Three times a day (TID) | INTRAMUSCULAR | Status: DC
  Administered 2017-03-07 – 2017-03-09 (×5): 5000 [IU] via SUBCUTANEOUS
  Filled 2017-03-07 (×5): qty 1

## 2017-03-07 MED ORDER — METOPROLOL SUCCINATE ER 50 MG PO TB24
50.0000 mg | ORAL_TABLET | Freq: Two times a day (BID) | ORAL | Status: DC
  Administered 2017-03-07 – 2017-03-09 (×4): 50 mg via ORAL
  Filled 2017-03-07 (×4): qty 1

## 2017-03-07 MED ORDER — ONDANSETRON HCL 4 MG PO TABS: 4.0000 mg | ORAL_TABLET | Freq: Four times a day (QID) | ORAL | Status: DC | PRN

## 2017-03-07 MED ORDER — FLUOXETINE HCL 20 MG PO CAPS
20.0000 mg | ORAL_CAPSULE | Freq: Every day | ORAL | Status: DC
  Administered 2017-03-07 – 2017-03-08 (×2): 20 mg via ORAL
  Filled 2017-03-07 (×3): qty 1

## 2017-03-07 MED ORDER — MOMETASONE FURO-FORMOTEROL FUM 200-5 MCG/ACT IN AERO
2.0000 | INHALATION_SPRAY | Freq: Two times a day (BID) | RESPIRATORY_TRACT | Status: DC
  Administered 2017-03-07 – 2017-03-09 (×4): 2 via RESPIRATORY_TRACT
  Filled 2017-03-07: qty 8.8

## 2017-03-07 MED ORDER — SODIUM CHLORIDE 0.9 % IV BOLUS (SEPSIS): 1000.0000 mL | Freq: Once | INTRAVENOUS | Status: DC

## 2017-03-07 MED ORDER — SODIUM CHLORIDE 0.9 % IV SOLN
INTRAVENOUS | Status: DC
  Administered 2017-03-07: 14:00:00 via INTRAVENOUS
  Administered 2017-03-07: 75 mL/h via INTRAVENOUS
  Administered 2017-03-08: 14:00:00 via INTRAVENOUS
  Administered 2017-03-09: 75 mL/h via INTRAVENOUS

## 2017-03-07 MED ORDER — ACETAMINOPHEN 650 MG RE SUPP: 650.0000 mg | Freq: Four times a day (QID) | RECTAL | Status: DC | PRN

## 2017-03-07 MED ORDER — CIPROFLOXACIN IN D5W 400 MG/200ML IV SOLN
400.0000 mg | Freq: Once | INTRAVENOUS | Status: AC
  Administered 2017-03-07: 400 mg via INTRAVENOUS
  Filled 2017-03-07: qty 200

## 2017-03-07 MED ORDER — ALPRAZOLAM 0.25 MG PO TABS
0.2500 mg | ORAL_TABLET | Freq: Three times a day (TID) | ORAL | Status: DC | PRN
  Administered 2017-03-07: 0.25 mg via ORAL
  Filled 2017-03-07: qty 1

## 2017-03-07 MED ORDER — ATORVASTATIN CALCIUM 20 MG PO TABS
20.0000 mg | ORAL_TABLET | Freq: Every evening | ORAL | Status: DC
  Administered 2017-03-07 – 2017-03-08 (×2): 20 mg via ORAL
  Filled 2017-03-07 (×2): qty 1

## 2017-03-07 MED ORDER — MAGNESIUM OXIDE 400 (241.3 MG) MG PO TABS
400.0000 mg | ORAL_TABLET | Freq: Two times a day (BID) | ORAL | Status: DC
  Administered 2017-03-07 – 2017-03-09 (×4): 400 mg via ORAL
  Filled 2017-03-07 (×4): qty 1

## 2017-03-07 MED ORDER — FLUTICASONE PROPIONATE 50 MCG/ACT NA SUSP
2.0000 | Freq: Every day | NASAL | Status: DC
  Administered 2017-03-08 – 2017-03-09 (×2): 2 via NASAL
  Filled 2017-03-07: qty 16

## 2017-03-07 MED ORDER — HYDROCHLOROTHIAZIDE 25 MG PO TABS
25.0000 mg | ORAL_TABLET | Freq: Every day | ORAL | Status: DC
  Administered 2017-03-08 – 2017-03-09 (×2): 25 mg via ORAL
  Filled 2017-03-07 (×2): qty 1

## 2017-03-07 MED ORDER — BISACODYL 10 MG RE SUPP: 10.0000 mg | Freq: Every day | RECTAL | Status: DC | PRN

## 2017-03-07 MED ORDER — DOCUSATE SODIUM 100 MG PO CAPS
100.0000 mg | ORAL_CAPSULE | Freq: Two times a day (BID) | ORAL | Status: DC
  Administered 2017-03-07 – 2017-03-08 (×2): 100 mg via ORAL
  Filled 2017-03-07 (×2): qty 1

## 2017-03-07 MED ORDER — ONDANSETRON HCL 4 MG/2ML IJ SOLN: 4.0000 mg | Freq: Four times a day (QID) | INTRAMUSCULAR | Status: DC | PRN

## 2017-03-07 MED ORDER — OXYCODONE HCL 5 MG PO TABS
5.0000 mg | ORAL_TABLET | Freq: Four times a day (QID) | ORAL | Status: DC | PRN
  Administered 2017-03-07: 5 mg via ORAL
  Filled 2017-03-07: qty 1

## 2017-03-07 MED ORDER — PANTOPRAZOLE SODIUM 40 MG PO TBEC
40.0000 mg | DELAYED_RELEASE_TABLET | Freq: Every day | ORAL | Status: DC
  Administered 2017-03-07 – 2017-03-09 (×3): 40 mg via ORAL
  Filled 2017-03-07 (×3): qty 1

## 2017-03-07 MED ORDER — HYDRALAZINE HCL 25 MG PO TABS
25.0000 mg | ORAL_TABLET | Freq: Four times a day (QID) | ORAL | Status: DC
  Administered 2017-03-07 – 2017-03-09 (×6): 25 mg via ORAL
  Filled 2017-03-07 (×7): qty 1

## 2017-03-07 MED ORDER — CIPROFLOXACIN HCL 500 MG PO TABS
500.0000 mg | ORAL_TABLET | Freq: Two times a day (BID) | ORAL | Status: DC
  Administered 2017-03-08 – 2017-03-09 (×3): 500 mg via ORAL
  Filled 2017-03-07 (×3): qty 1

## 2017-03-07 MED ORDER — VITAMIN D 1000 UNITS PO TABS
1000.0000 [IU] | ORAL_TABLET | Freq: Every day | ORAL | Status: DC
  Administered 2017-03-08 – 2017-03-09 (×2): 1000 [IU] via ORAL
  Filled 2017-03-07 (×2): qty 1

## 2017-03-07 MED ORDER — SODIUM CHLORIDE 0.9 % IV BOLUS (SEPSIS)
1000.0000 mL | Freq: Once | INTRAVENOUS | Status: AC
  Administered 2017-03-07: 1000 mL via INTRAVENOUS

## 2017-03-07 MED ORDER — ACETAMINOPHEN 325 MG PO TABS
650.0000 mg | ORAL_TABLET | Freq: Four times a day (QID) | ORAL | Status: DC | PRN
  Administered 2017-03-09: 650 mg via ORAL
  Filled 2017-03-07: qty 2

## 2017-03-07 MED ORDER — LOSARTAN POTASSIUM 50 MG PO TABS
100.0000 mg | ORAL_TABLET | Freq: Every day | ORAL | Status: DC
  Administered 2017-03-07 – 2017-03-09 (×3): 100 mg via ORAL
  Filled 2017-03-07 (×3): qty 2

## 2017-03-07 NOTE — H&P (Signed)
History and Physical    [name]:[id number] DOB: 1933-08-05 DOA: 03/07/2017  Referring physician: Dr. Reita Cliche PCP: [name], MD  Specialists: none  Chief Complaint: weakness  HPI: Janice Perkins is a 83 y.o. female has a past medical history significant for HTN, asthma, recent pneumonia and back surgery now with weakness and anorexia. No fever. Some nausea but no vomiting or diarrhea. Denies CP or SOB. In ER, sodium low and UTI noted. She is now admitted  Review of Systems: The patient denies  fever, weight loss,, vision loss, decreased hearing, hoarseness, chest pain, syncope, dyspnea on exertion, peripheral edema, balance deficits, hemoptysis, abdominal pain, melena, hematochezia, severe indigestion/heartburn, hematuria, incontinence, genital sores, , suspicious skin lesions, transient blindness, difficulty walking, depression, unusual weight change, abnormal bleeding, enlarged lymph nodes, angioedema, and breast masses.   Past Medical History:  Diagnosis Date  . Asthma   . Breast cancer ([place]) 2011  . Bronchitis 04/2015  . Cancer [facility]) 2011   breast  . COPD (chronic obstructive pulmonary disease) ([place])   . Hypertension   . Shingles    10/2015   Past Surgical History:  Procedure Laterality Date  . BREAST EXCISIONAL BIOPSY Right 11/29/13   two areas  . BREAST EXCISIONAL BIOPSY Right 10/30/2009   lumpectomy - radiation   Social History:  reports that she has never smoked. She has never used smokeless tobacco. She reports that she does not drink alcohol or use drugs.  Allergies  Allergen Reactions  . Penicillins Hives    .Has patient had a PCN reaction causing immediate rash, facial/tongue/throat swelling, SOB or lightheadedness with hypotension: Unknown Has patient had a PCN reaction causing severe rash involving mucus membranes or skin necrosis: Unknown Has patient had a PCN reaction that required hospitalization: Unknown Has patient had a PCN reaction occurring  within the last 10 years: Unknown If all of the above answers are "NO", then may proceed with Cephalosporin use.   . Captopril Other (See Comments)  . Enalapril Maleate     Other reaction(s): Unknown  . Seldane  [Terfenadine] Other (See Comments)  . Tape Itching  . Augmentin [Amoxicillin-Pot Clavulanate] Rash  . Biaxin [Clarithromycin] Rash, Other (See Comments) and Hives    Other reaction(s): Unknown    Family History  Problem Relation Age of Onset  . Hypertension Mother   . Heart disease Mother   . Cancer Mother   . Stroke Father   . Hypertension Father   . Breast cancer Sister     Prior to Admission medications   Medication Sig Start Date End Date Taking? Authorizing Provider  acetaminophen (TYLENOL) 325 MG tablet Take 2 tablets (650 mg total) by mouth every 6 (six) hours as needed for mild pain (or Fever >/= 101). 12/22/16  Yes Gouru, Illene Silver, MD  ALPRAZolam Duanne Moron) 0.25 MG tablet Take 1 tablet (0.25 mg total) by mouth 3 (three) times daily as needed for anxiety. 12/22/16  Yes Gouru, Illene Silver, MD  aspirin 81 MG tablet Take 81 mg by mouth daily.    Yes [provider]  atorvastatin (LIPITOR) 20 MG tablet Take 20 mg by mouth daily.   Yes [provider]  cholecalciferol (VITAMIN D) 1000 units tablet Take 1,000 Units by mouth daily.   Yes [provider]  docusate sodium (COLACE) 100 MG capsule Take 1 capsule (100 mg total) by mouth daily. 12/22/16  Yes Gouru, Illene Silver, MD  esomeprazole (NEXIUM) 40 MG capsule Take 40 mg by mouth daily at 12  noon.   Yes [provider]  FLUoxetine (PROZAC) 20 MG capsule Take 20 mg by mouth 2 (two) times daily.   Yes [provider]  fluticasone (FLONASE) 50 MCG/ACT nasal spray Place 2 sprays into both nostrils daily.    Yes [provider]  Fluticasone-Salmeterol (ADVAIR) 250-50 MCG/DOSE AEPB Inhale 1 puff into the lungs 2 (two) times daily.   Yes [provider]  hydrALAZINE (APRESOLINE) 25 MG  tablet Take 1 tablet (25 mg total) by mouth every 6 (six) hours. 12/22/16  Yes Gouru, Illene Silver, MD  hydrochlorothiazide (HYDRODIURIL) 25 MG tablet Take 1 tablet (25 mg total) by mouth daily. Patient taking differently: Take 50 mg by mouth daily.  12/23/16  Yes Gouru, Illene Silver, MD  levothyroxine (SYNTHROID, LEVOTHROID) 75 MCG tablet Take 1 tablet (75 mcg total) by mouth daily before breakfast. 12/23/16  Yes Gouru, Aruna, MD  losartan (COZAAR) 100 MG tablet Take 1 tablet (100 mg total) by mouth daily. 12/23/16  Yes Gouru, Illene Silver, MD  magnesium oxide (MAG-OX) 400 MG tablet Take 400 mg by mouth 2 (two) times daily.   Yes [provider]  metoprolol succinate (TOPROL-XL) 100 MG 24 hr tablet Take 1 tablet (100 mg total) by mouth 2 (two) times daily with a meal. Take with or immediately following a meal. Patient taking differently: Take 50 mg by mouth 2 (two) times daily with a meal. Take with or immediately following a meal. 12/22/16  Yes Gouru, Aruna, MD  ondansetron (ZOFRAN ODT) 4 MG disintegrating tablet Take 1 tablet (4 mg total) by mouth every 8 (eight) hours as needed for nausea or vomiting. 01/21/17  Yes Vaughan Basta, MD  QUEtiapine (SEROQUEL) 25 MG tablet Take 1 tablet (25 mg total) by mouth at bedtime. Patient not taking: Reported on 03/07/2017 12/22/16   Nicholes Mango, MD   Physical Exam: Vitals:   03/07/17 0759 03/07/17 1000 03/07/17 1012 03/07/17 1230  BP: 116/64 (!) 148/89 (!) 148/89 (!) 142/68  Pulse: 64 (!) 59 (!) 59 63  Resp: 18  20 12   Temp: 98.5 F (36.9 C)     TempSrc: Oral     SpO2: 96% 99% 94% 95%  Weight: 77.6 kg (171 lb)     Height: 5' (1.524 m)        General:  No apparent distress, WDWN, [place]/AT  Eyes: PERRL, EOMI, no scleral icterus , conjunctiva clear  ENT: moist oropharynx without exudate, TM's benign, dentition good  Neck: supple, no lymphadenopathy. No bruits or thyromegaly  Cardiovascular: regular rate without MRG; 2+ peripheral pulses, no JVD, trace peripheral  edema  Respiratory: CTA biL, good air movement without wheezing, rhonchi or crackled. Respiratory effort normal  Abdomen: soft, non tender to palpation, positive bowel sounds, no guarding, no rebound  Skin: no rashes or lesions  Musculoskeletal: normal bulk and tone, no joint swelling  Psychiatric: normal mood and affect, A&OX3  Neurologic: CN 2-12 grossly intact, Motor strength 5/5 in all 4 groups with symmetric DTR's and non-focal sensory exam  Labs on Admission:  Basic Metabolic Panel:  Recent Labs Lab 03/07/17 0802  NA 125*  K 3.2*  CL 89*  CO2 26  GLUCOSE 115*  BUN 14  CREATININE 0.79  CALCIUM 9.1   Liver Function Tests: No results for input(s): AST, ALT, ALKPHOS, BILITOT, PROT, ALBUMIN in the last 168 hours. No results for input(s): LIPASE, AMYLASE in the last 168 hours. No results for input(s): AMMONIA in the last 168 hours. CBC:  Recent Labs Lab 03/07/17  0802  WBC 11.4*  HGB 11.8*  HCT 35.1  MCV 79.4*  PLT 206   Cardiac Enzymes:  Recent Labs Lab 03/07/17 0802  TROPONINI <0.03    BNP (last 3 results)  Recent Labs  01/19/17 0510  BNP 827.0*    ProBNP (last 3 results) No results for input(s): PROBNP in the last 8760 hours.  CBG: No results for input(s): GLUCAP in the last 168 hours.  Radiological Exams on Admission: Dg Chest 2 View  Result Date: 03/07/2017 CLINICAL DATA:  Chills since Thursday, nausea this week, began taking Z-Pak and prednisone yesterday, dehydration and poor p.o. intake, headache, weakness, history RIGHT breast cancer,hypertension, COPD EXAM: CHEST  2 VIEW COMPARISON:  01/23/2017 FINDINGS: Enlargement of cardiac silhouette with slight pulmonary vascular congestion. Mediastinal contours normal with atherosclerotic calcification aorta. Chronic bronchitic changes with slight accentuation of interstitial markings versus previous exam question minimal pulmonary edema. No segmental consolidation, pleural effusion or pneumothorax.  Bones diffusely demineralized. LEFT shoulder prosthesis. IMPRESSION: Enlargement of cardiac silhouette with pulmonary vascular congestion. Bronchitic changes and question minimal pulmonary edema. Electronically Signed   By: Lavonia Dana M.D.   On: 03/07/2017 11:25   Ct Head Wo Contrast  Result Date: 03/07/2017 CLINICAL DATA:  Chills since Thursday, nausea this week, began taking Z-Pak and prednisone yesterday, dehydration and poor p.o. intake, headache, weakness, history RIGHT breast cancer, hypertension, COPD EXAM: CT HEAD WITHOUT CONTRAST TECHNIQUE: Contiguous axial images were obtained from the base of the skull through the vertex without intravenous contrast. Sagittal and coronal MPR images reconstructed from axial data set. COMPARISON:  12/18/2016 FINDINGS: Brain: Generalized atrophy. Normal ventricular morphology. No midline shift or mass effect. Small vessel chronic ischemic changes of deep cerebral white matter. No intracranial hemorrhage, mass lesion, evidence of acute infarction, or extra-axial fluid collection. Vascular: Atherosclerotic calcifications at the carotid siphons. Skull: Intact though demineralized Sinuses/Orbits: Clear Other: N/A IMPRESSION: Atrophy with small vessel chronic ischemic changes of deep cerebral white matter. No acute intracranial abnormalities. Electronically Signed   By: Lavonia Dana M.D.   On: 03/07/2017 11:14    EKG: Independently reviewed.  Assessment/Plan Principal Problem:   Hyponatremia Active Problems:   Generalized weakness   Dehydration   UTI (urinary tract infection)   Will observe on floor with IV fluids and po ABX. Consult PT and CM. Repeat labs in AM  Diet: low salt Fluids: NS@75  DVT Prophylaxis: SQ Heparin  Code Status: FULL  Family Communication: yes  Disposition Plan: home  Time spent: 50 min

## 2017-03-07 NOTE — ED Provider Notes (Signed)
[facility] Emergency Department Provider Note ____________________________________________   I have reviewed the triage vital signs and the triage nursing note.  HISTORY  Chief Complaint Dehydration   Historian Patient  HPI Janice Perkins is a 83 y.o. female presents with a complaint of feeling fatigued since Thursday. No focal weakness or numbness but she feels overall weakness. She thought she might be dehydrated. At one point on Thursday she was having chills although she does not have a fever, she found her blood pressure was very high and so she could've blood pressure medication for it.  States that she has been in the hospital since March with a pneumonia, she also had back surgery in March. She is not reporting any new back pain. She did not report any chest pain or trouble breathing.  Reportedly patient started back on a Z-Pak and prednisone yesterday because of the recent diagnosis of pneumonia. She is having a home health care nurse check on her frequently.    Past Medical History:  Diagnosis Date  . Asthma   . Breast cancer ([place]) 2011  . Bronchitis 04/2015  . Cancer [facility]) 2011   breast  . COPD (chronic obstructive pulmonary disease) ([place])   . Hypertension   . Shingles    10/2015    Patient Active Problem List   Diagnosis Date Noted  . HCAP (healthcare-associated pneumonia) 01/19/2017  . AKI (acute kidney injury) ([place]) 12/18/2016  . Primary cancer of upper outer quadrant of right female breast ([place]) 04/07/2016  . Lumbar radiculopathy 03/23/2016  . Spinal stenosis, lumbar region, with neurogenic claudication 03/23/2016  . DDD (degenerative disc disease), lumbar 01/14/2015  . Lumbosacral facet joint syndrome ([place]) 01/14/2015  . Sacroiliac joint dysfunction 01/14/2015  . Greater trochanteric bursitis 01/14/2015  . Bilateral occipital neuralgia 01/14/2015    Past Surgical History:  Procedure Laterality Date  . BREAST EXCISIONAL BIOPSY  Right 11/29/13   two areas  . BREAST EXCISIONAL BIOPSY Right 10/30/2009   lumpectomy - radiation    Prior to Admission medications   Medication Sig Start Date End Date Taking? Authorizing Provider  acetaminophen (TYLENOL) 325 MG tablet Take 2 tablets (650 mg total) by mouth every 6 (six) hours as needed for mild pain (or Fever >/= 101). 12/22/16  Yes Gouru, Illene Silver, MD  ALPRAZolam Duanne Moron) 0.25 MG tablet Take 1 tablet (0.25 mg total) by mouth 3 (three) times daily as needed for anxiety. 12/22/16  Yes Gouru, Illene Silver, MD  aspirin 81 MG tablet Take 81 mg by mouth daily.    Yes [provider]  atorvastatin (LIPITOR) 20 MG tablet Take 20 mg by mouth daily.   Yes [provider]  cholecalciferol (VITAMIN D) 1000 units tablet Take 1,000 Units by mouth daily.   Yes [provider]  docusate sodium (COLACE) 100 MG capsule Take 1 capsule (100 mg total) by mouth daily. 12/22/16  Yes Gouru, Illene Silver, MD  esomeprazole (NEXIUM) 40 MG capsule Take 40 mg by mouth daily at 12 noon.   Yes [provider]  FLUoxetine (PROZAC) 20 MG capsule Take 20 mg by mouth 2 (two) times daily.   Yes [provider]  fluticasone (FLONASE) 50 MCG/ACT nasal spray Place 2 sprays into both nostrils daily.    Yes [provider]  Fluticasone-Salmeterol (ADVAIR) 250-50 MCG/DOSE AEPB Inhale 1 puff into the lungs 2 (two) times daily.   Yes [provider]  hydrALAZINE (APRESOLINE) 25 MG tablet Take 1 tablet (25 mg total) by mouth every  6 (six) hours. 12/22/16  Yes Gouru, Illene Silver, MD  hydrochlorothiazide (HYDRODIURIL) 25 MG tablet Take 1 tablet (25 mg total) by mouth daily. Patient taking differently: Take 50 mg by mouth daily.  12/23/16  Yes Gouru, Illene Silver, MD  levothyroxine (SYNTHROID, LEVOTHROID) 75 MCG tablet Take 1 tablet (75 mcg total) by mouth daily before breakfast. 12/23/16  Yes Gouru, Aruna, MD  losartan (COZAAR) 100 MG tablet Take 1 tablet (100 mg total) by mouth daily. 12/23/16  Yes Gouru,  Illene Silver, MD  magnesium oxide (MAG-OX) 400 MG tablet Take 400 mg by mouth 2 (two) times daily.   Yes [provider]  metoprolol succinate (TOPROL-XL) 100 MG 24 hr tablet Take 1 tablet (100 mg total) by mouth 2 (two) times daily with a meal. Take with or immediately following a meal. Patient taking differently: Take 50 mg by mouth 2 (two) times daily with a meal. Take with or immediately following a meal. 12/22/16  Yes Gouru, Aruna, MD  ondansetron (ZOFRAN ODT) 4 MG disintegrating tablet Take 1 tablet (4 mg total) by mouth every 8 (eight) hours as needed for nausea or vomiting. 01/21/17  Yes Vaughan Basta, MD  QUEtiapine (SEROQUEL) 25 MG tablet Take 1 tablet (25 mg total) by mouth at bedtime. Patient not taking: Reported on 03/07/2017 12/22/16   Nicholes Mango, MD    Allergies  Allergen Reactions  . Penicillins Hives    .Has patient had a PCN reaction causing immediate rash, facial/tongue/throat swelling, SOB or lightheadedness with hypotension: Unknown Has patient had a PCN reaction causing severe rash involving mucus membranes or skin necrosis: Unknown Has patient had a PCN reaction that required hospitalization: Unknown Has patient had a PCN reaction occurring within the last 10 years: Unknown If all of the above answers are "NO", then may proceed with Cephalosporin use.   . Captopril Other (See Comments)  . Enalapril Maleate     Other reaction(s): Unknown  . Seldane  [Terfenadine] Other (See Comments)  . Tape Itching  . Augmentin [Amoxicillin-Pot Clavulanate] Rash  . Biaxin [Clarithromycin] Rash, Other (See Comments) and Hives    Other reaction(s): Unknown    Family History  Problem Relation Age of Onset  . Hypertension Mother   . Heart disease Mother   . Cancer Mother   . Stroke Father   . Hypertension Father   . Breast cancer Sister     Social History Social History  Substance Use Topics  . Smoking status: Never Smoker  . Smokeless tobacco: Never Used  . Alcohol  use No    Review of Systems  Constitutional: Negative for fever. Eyes: Negative for visual changes. ENT: Negative for sore throat. Cardiovascular: Negative for chest pain. Respiratory: Negative for shortness of breath. Gastrointestinal: Negative for abdominal pain, vomiting and diarrhea. Genitourinary: Negative for dysuria. Musculoskeletal: Negative for back pain. Skin: Negative for rash. Neurological: positive for mild general/global headache.  ____________________________________________   PHYSICAL EXAM:  VITAL SIGNS: ED Triage Vitals [03/07/17 0759]  Enc Vitals Group     BP 116/64     Pulse Rate 64     Resp 18     Temp 98.5 F (36.9 C)     Temp Source Oral     SpO2 96 %     Weight 171 lb (77.6 kg)     Height 5' (1.524 m)     Head Circumference      Peak Flow      Pain Score      Pain Loc  Pain Edu?      Excl. in [place]?      Constitutional: Alert and oriented. Well appearing and in no distress. HEENT   Head: Normocephalic and atraumatic.      Eyes: Conjunctivae are normal. Pupils equal and round.       Ears:         Nose: No congestion/rhinnorhea.   Mouth/Throat: Mucous membranes are mildly dry.   Neck: No stridor. Cardiovascular/Chest: Normal rate, regular rhythm.  No murmurs, rubs, or gallops. Respiratory: Normal respiratory effort without tachypnea nor retractions. Breath sounds are clear and equal bilaterally. No wheezes/rales/rhonchi. Gastrointestinal: Soft. No distention, no guarding, no rebound. Nontender.    Genitourinary/rectal:Deferred Musculoskeletal: Nontender with normal range of motion in all extremities. No joint effusions.  No lower extremity tenderness.  No edema. Neurologic:  Normal speech and language. No gross or focal neurologic deficits are appreciated. Skin:  Skin is warm, dry and intact. No rash noted. Psychiatric: Mood and affect are normal. Speech and behavior are normal. Patient exhibits appropriate insight and  judgment.   ____________________________________________  LABS (pertinent positives/negatives)  Labs Reviewed  BASIC METABOLIC PANEL - Abnormal; Notable for the following:       Result Value   Sodium 125 (*)    Potassium 3.2 (*)    Chloride 89 (*)    Glucose, Bld 115 (*)    All other components within normal limits  CBC - Abnormal; Notable for the following:    WBC 11.4 (*)    Hemoglobin 11.8 (*)    MCV 79.4 (*)    RDW 17.5 (*)    All other components within normal limits  URINALYSIS, COMPLETE (UACMP) WITH MICROSCOPIC - Abnormal; Notable for the following:    Color, Urine YELLOW (*)    APPearance CLEAR (*)    Leukocytes, UA TRACE (*)    Bacteria, UA RARE (*)    Squamous Epithelial / LPF 0-5 (*)    All other components within normal limits  URINE CULTURE  TROPONIN I    ____________________________________________    EKG I, Lisa Roca, MD, the attending physician have personally viewed and interpreted all ECGs.  64 bpm. Irregularly irregular consistent with atrial fibrillation. Narrow QRS. Normal axis. Nonspecific ST and T-wave. ____________________________________________  RADIOLOGY All Xrays were viewed by me. Imaging interpreted by Radiologist.  CT without contrast:  IMPRESSION: Atrophy with small vessel chronic ischemic changes of deep cerebral white matter.  No acute intracranial abnormalities.  Chest x-ray:  IMPRESSION: Enlargement of cardiac silhouette with pulmonary vascular congestion.  Bronchitic changes and question minimal pulmonary edema. __________________________________________  PROCEDURES  Procedure(s) performed: None  Critical Care performed: None  ____________________________________________   ED COURSE / ASSESSMENT AND PLAN  Pertinent labs & imaging results that were available during my care of the patient were reviewed by me and considered in my medical decision making (see chart for details).   Ms. Mauriello is here for fit T  did not feeling well for 3-4 days, possibly a little longer than that.Nonspecific symptoms although she is not complaining specifically of chest pain or trouble breathing, I am adding on EKG and troponin. She apparently started back on prednisone and Z-Pak for possibility of pneumonia although it doesn't sound like she is having specific complaints of pneumonia, she has had 2 bouts with pneumonia.  She is found to have hyponatremia, which may be giving her some fatigue and weakness and headache. I'm starting her on IV fluid bolus.  I will go ahead and admit  for hospital observation admission.  Urinalysis looks likely consistent with UTI, given also slightly elevated white blood cell count, I am going to cover with Cipro area she reports allergy to penicillins. Urine culture was sent. Patient does not appear to be septic.  CONSULTATIONS:   Hospitalist for admission.   Patient / Family / Caregiver informed of clinical course, medical decision-making process, and agree with plan.  ___________________________________________   FINAL CLINICAL IMPRESSION(S) / ED DIAGNOSES   Final diagnoses:  Hyponatremia  Fatigue, unspecified type  Urinary tract infection without hematuria, site unspecified              Note: This dictation was prepared with Dragon dictation. Any transcriptional errors that result from this process are unintentional    Lisa Roca, MD 03/07/17 1256

## 2017-03-07 NOTE — ED Notes (Signed)
Patient transported to CT 

## 2017-03-07 NOTE — Progress Notes (Signed)
Pt c/o mid to low back pain. Tylenol PO ordered as PRN for pain. Pt refused to have Tylenol and stated "it will not do anything". Dr Almyra Free paged and placed an order for Oxycodone 5-10mg  every 6hrs PRN for pain. Will administer as ordered.

## 2017-03-07 NOTE — Progress Notes (Signed)
Pt verbalized concern about "not receiving any medicines" this morning. Writer reviewed due medications on MAR with pt. Pt agreed about due meds at 2000 and wanted to take them. Due medications given as ordered.

## 2017-03-07 NOTE — ED Triage Notes (Addendum)
Pt reports Thursday night pt states she had the chills and thought her BP was high. Pt state she has been nauseated during the week.  Pt states she had PNA in the past. Pt states she started taking a Z-Pak and prednisone yesterday, Pt states she took a Zofran yesterday also for nausea. Pt states she had back surgery in March and has been in and out of the hospital since.  Pt c/o dehydration and poor PO intake

## 2017-03-07 NOTE — Care Management Obs Status (Signed)
[facility] NOTIFICATION   Patient Details  Name: Janice Perkins MRN: [id number] Date of Birth: August 10, 1933   Medicare Observation Status Notification Given:  Yes ([place] Letter)    Mardene Speak, RN 03/07/2017, 4:11 PM

## 2017-03-08 DIAGNOSIS — Z7951 Long term (current) use of inhaled steroids: Secondary | ICD-10-CM | POA: Diagnosis not present

## 2017-03-08 DIAGNOSIS — E860 Dehydration: Secondary | ICD-10-CM | POA: Diagnosis present

## 2017-03-08 DIAGNOSIS — F329 Major depressive disorder, single episode, unspecified: Secondary | ICD-10-CM | POA: Diagnosis present

## 2017-03-08 DIAGNOSIS — N390 Urinary tract infection, site not specified: Secondary | ICD-10-CM | POA: Diagnosis present

## 2017-03-08 DIAGNOSIS — E871 Hypo-osmolality and hyponatremia: Secondary | ICD-10-CM | POA: Diagnosis present

## 2017-03-08 DIAGNOSIS — E785 Hyperlipidemia, unspecified: Secondary | ICD-10-CM | POA: Diagnosis present

## 2017-03-08 DIAGNOSIS — Z853 Personal history of malignant neoplasm of breast: Secondary | ICD-10-CM | POA: Diagnosis not present

## 2017-03-08 DIAGNOSIS — I482 Chronic atrial fibrillation: Secondary | ICD-10-CM | POA: Diagnosis present

## 2017-03-08 DIAGNOSIS — Z79899 Other long term (current) drug therapy: Secondary | ICD-10-CM | POA: Diagnosis not present

## 2017-03-08 DIAGNOSIS — I10 Essential (primary) hypertension: Secondary | ICD-10-CM | POA: Diagnosis present

## 2017-03-08 DIAGNOSIS — Z881 Allergy status to other antibiotic agents status: Secondary | ICD-10-CM | POA: Diagnosis not present

## 2017-03-08 DIAGNOSIS — J449 Chronic obstructive pulmonary disease, unspecified: Secondary | ICD-10-CM | POA: Diagnosis present

## 2017-03-08 DIAGNOSIS — E039 Hypothyroidism, unspecified: Secondary | ICD-10-CM | POA: Diagnosis present

## 2017-03-08 DIAGNOSIS — K5900 Constipation, unspecified: Secondary | ICD-10-CM | POA: Diagnosis present

## 2017-03-08 DIAGNOSIS — B09 Unspecified viral infection characterized by skin and mucous membrane lesions: Secondary | ICD-10-CM | POA: Diagnosis present

## 2017-03-08 DIAGNOSIS — Z888 Allergy status to other drugs, medicaments and biological substances status: Secondary | ICD-10-CM | POA: Diagnosis not present

## 2017-03-08 DIAGNOSIS — T502X5A Adverse effect of carbonic-anhydrase inhibitors, benzothiadiazides and other diuretics, initial encounter: Secondary | ICD-10-CM | POA: Diagnosis present

## 2017-03-08 DIAGNOSIS — Z7982 Long term (current) use of aspirin: Secondary | ICD-10-CM | POA: Diagnosis not present

## 2017-03-08 DIAGNOSIS — Z880 Allergy status to penicillin: Secondary | ICD-10-CM | POA: Diagnosis not present

## 2017-03-08 LAB — COMPREHENSIVE METABOLIC PANEL
ALBUMIN: 3.2 g/dL — AB (ref 3.5–5.0)
ALT: 16 U/L (ref 14–54)
AST: 25 U/L (ref 15–41)
Alkaline Phosphatase: 54 U/L (ref 38–126)
Anion gap: 10 (ref 5–15)
BUN: 12 mg/dL (ref 6–20)
CHLORIDE: 92 mmol/L — AB (ref 101–111)
CO2: 26 mmol/L (ref 22–32)
CREATININE: 0.95 mg/dL (ref 0.44–1.00)
Calcium: 8.8 mg/dL — ABNORMAL LOW (ref 8.9–10.3)
GFR calc Af Amer: >60 mL/min (ref >60)
GFR, EST NON AFRICAN AMERICAN: 54 mL/min — AB (ref >60)
GLUCOSE: 105 mg/dL — AB (ref 65–99)
POTASSIUM: 4 mmol/L (ref 3.5–5.1)
Sodium: 128 mmol/L — ABNORMAL LOW (ref 135–145)
Total Bilirubin: 0.8 mg/dL (ref 0.3–1.2)
Total Protein: 6.6 g/dL (ref 6.5–8.1)

## 2017-03-08 LAB — CBC
HEMATOCRIT: 32.7 % — AB (ref 35.0–47.0)
Hemoglobin: 11.2 g/dL — ABNORMAL LOW (ref 12.0–16.0)
MCH: 27.2 pg (ref 26.0–34.0)
MCHC: 34.3 g/dL (ref 32.0–36.0)
MCV: 79.4 fL — AB (ref 80.0–100.0)
PLATELETS: 185 10*3/uL (ref 150–440)
RBC: 4.12 MIL/uL (ref 3.80–5.20)
RDW: 18.5 % — ABNORMAL HIGH (ref 11.5–14.5)
WBC: 10.1 10*3/uL (ref 3.6–11.0)

## 2017-03-08 MED ORDER — SENNOSIDES-DOCUSATE SODIUM 8.6-50 MG PO TABS
2.0000 | ORAL_TABLET | Freq: Two times a day (BID) | ORAL | Status: DC
  Administered 2017-03-08 – 2017-03-09 (×2): 2 via ORAL
  Filled 2017-03-08 (×3): qty 2

## 2017-03-08 NOTE — Progress Notes (Signed)
[facility] NAME: Janice Perkins    MR#:  [id number]  DATE OF BIRTH:  1933/03/12  SUBJECTIVE:  CHIEF COMPLAINT:   Constipated.  REVIEW OF SYSTEMS:  CONSTITUTIONAL: No fever, fatigue or weakness.  EYES: No blurred or double vision.  EARS, NOSE, AND THROAT: No tinnitus or ear pain.  RESPIRATORY: No cough, shortness of breath, wheezing or hemoptysis.  CARDIOVASCULAR: No chest pain, orthopnea, edema.  GASTROINTESTINAL: No nausea, vomiting, diarrhea or abdominal pain.  GENITOURINARY: No dysuria, hematuria.  ENDOCRINE: No polyuria, nocturia,  HEMATOLOGY: No anemia, easy bruising or bleeding SKIN: No rash or lesion. MUSCULOSKELETAL: No joint pain or arthritis.   NEUROLOGIC: No tingling, numbness, weakness.  PSYCHIATRY: No anxiety or depression.  DRUG ALLERGIES:   Allergies  Allergen Reactions  . Penicillins Hives    .Has patient had a PCN reaction causing immediate rash, facial/tongue/throat swelling, SOB or lightheadedness with hypotension: Unknown Has patient had a PCN reaction causing severe rash involving mucus membranes or skin necrosis: Unknown Has patient had a PCN reaction that required hospitalization: Unknown Has patient had a PCN reaction occurring within the last 10 years: Unknown If all of the above answers are "NO", then may proceed with Cephalosporin use.   . Captopril Other (See Comments)  . Enalapril Maleate     Other reaction(s): Unknown  . Seldane  [Terfenadine] Other (See Comments)  . Tape Itching  . Augmentin [Amoxicillin-Pot Clavulanate] Rash  . Biaxin [Clarithromycin] Rash, Other (See Comments) and Hives    Other reaction(s): Unknown    VITALS:  Blood pressure (!) 131/51, pulse 66, temperature 98.6 F (37 C), temperature source Oral, resp. rate 18, height 5' (1.524 m), weight 78.1 kg (172 lb 1.6 oz), SpO2 95 %. PHYSICAL EXAMINATION:  GENERAL:  83 y.o.-year-old patient lying in the bed with no  acute distress.  EYES: Pupils equal, round, reactive to light and accommodation. No scleral icterus. Extraocular muscles intact.  HEENT: Head atraumatic, normocephalic. Oropharynx and nasopharynx clear.  NECK:  Supple, no jugular venous distention. No thyroid enlargement, no tenderness.  LUNGS: Normal breath sounds bilaterally, no wheezing, some crepitation. No use of accessory muscles of respiration.  CARDIOVASCULAR: S1, S2 normal. No murmurs, rubs, or gallops.  ABDOMEN: Soft, nontender, nondistended. Bowel sounds present. No organomegaly or mass.  EXTREMITIES: No pedal edema, cyanosis, or clubbing.  NEUROLOGIC: Cranial nerves II through XII are intact. Muscle strength 4/5 in all extremities. Sensation intact. Gait not checked.  PSYCHIATRIC: The patient is alert and oriented x 3.  SKIN: No obvious rash, lesion, or ulcer.  LABORATORY PANEL:   CBC  Recent Labs Lab 03/08/17 0357  WBC 10.1  HGB 11.2*  HCT 32.7*  PLT 185   ------------------------------------------------------------------------------------------------------------------  Chemistries   Recent Labs Lab 03/08/17 0357  NA 128*  K 4.0  CL 92*  CO2 26  GLUCOSE 105*  BUN 12  CREATININE 0.95  CALCIUM 8.8*  AST 25  ALT 16  ALKPHOS 54  BILITOT 0.8   ------------------------------------------------------------------------------------------------------------------  Cardiac Enzymes  Recent Labs Lab 03/07/17 0802  TROPONINI <0.03   ------------------------------------------------------------------------------------------------------------------  RADIOLOGY:  Dg Chest 2 View  Result Date: 03/07/2017 CLINICAL DATA:  Chills since Thursday, nausea this week, began taking Z-Pak and prednisone yesterday, dehydration and poor p.o. intake, headache, weakness, history RIGHT breast cancer,hypertension, COPD EXAM: CHEST  2 VIEW COMPARISON:  01/23/2017 FINDINGS: Enlargement of cardiac silhouette with slight pulmonary  vascular congestion. Mediastinal contours normal with atherosclerotic calcification aorta. Chronic  bronchitic changes with slight accentuation of interstitial markings versus previous exam question minimal pulmonary edema. No segmental consolidation, pleural effusion or pneumothorax. Bones diffusely demineralized. LEFT shoulder prosthesis. IMPRESSION: Enlargement of cardiac silhouette with pulmonary vascular congestion. Bronchitic changes and question minimal pulmonary edema. Electronically Signed   By: Lavonia Dana M.D.   On: 03/07/2017 11:25   Ct Head Wo Contrast  Result Date: 03/07/2017 CLINICAL DATA:  Chills since Thursday, nausea this week, began taking Z-Pak and prednisone yesterday, dehydration and poor p.o. intake, headache, weakness, history RIGHT breast cancer, hypertension, COPD EXAM: CT HEAD WITHOUT CONTRAST TECHNIQUE: Contiguous axial images were obtained from the base of the skull through the vertex without intravenous contrast. Sagittal and coronal MPR images reconstructed from axial data set. COMPARISON:  12/18/2016 FINDINGS: Brain: Generalized atrophy. Normal ventricular morphology. No midline shift or mass effect. Small vessel chronic ischemic changes of deep cerebral white matter. No intracranial hemorrhage, mass lesion, evidence of acute infarction, or extra-axial fluid collection. Vascular: Atherosclerotic calcifications at the carotid siphons. Skull: Intact though demineralized Sinuses/Orbits: Clear Other: N/A IMPRESSION: Atrophy with small vessel chronic ischemic changes of deep cerebral white matter. No acute intracranial abnormalities. Electronically Signed   By: Lavonia Dana M.D.   On: 03/07/2017 11:14   EKG:   ASSESSMENT AND PLAN:  This is an 83 year old female admitted for severe hyponatremia  * Severe hyponatremia - Likely due to dehydration poor by mouth intake due to constant nausea - The sodium is improving with IV hydration - Sodium has improved from 125->128  * Acute  Viral exanthem - She reports high fever on last Thursday  - urine does not look very impressive.  Her urine culture is negative.  There is no other source of infection. - Continue Cipro for now.  * Nausea - Likely due to severe constipation and opioids - She is feeling somewhat better.  * Severe constipation - She reports no bowel movement since last Saturday - This may be the likely cause of her nausea along with her opioids - Continue aggressive bowel regimen.  * Hypertension:  -Continue hydrochlorothiazide, hydralazine, metoprolol and losartan.  - Monitor blood pressure and adjust meds as needed  * Permanent Atrial fibrillation:  rate controlled on metoprolol. Aspirin only for anticoagulation  * COPD: Continue inhaled corticosteroid. DuoNeb's as needed  * Hypothyroidism: continue Synthroid  * Hyperlipidemia: Continue statin therapy  * Depression: Continue Prozac, xanax   DVT prophylaxis: Heparin GI prophylaxis: None   Discontinue telemetry,  PT/OT eval   All the records are reviewed and case discussed with Care Management/Social Worker. Management plans discussed with the patient, nursing and they are in agreement.  CODE STATUS: Full code   TOTAL TIME TAKING CARE OF THIS PATIENT: 35 minutes.   POSSIBLE D/C IN 1-2 DAYS, DEPENDING ON CLINICAL CONDITION.  Note: This dictation was prepared with Dragon dictation along with smaller phrase technology. Any transcriptional errors that result from this process are unintentional.   Max Sane M.D on 03/08/2017 at 8:45 AM  Between 7am to 6pm - Pager - [phone] After 6pm go to [email or web address] - password [email or web address] [facility]  Office  [phone]  CC: Primary care physician; [name], MD

## 2017-03-08 NOTE — Evaluation (Signed)
Physical Therapy Evaluation Patient Details Name: Janice Perkins MRN: [id number] DOB: 11/16/1932 Today's Date: 03/08/2017   History of Present Illness  presented to ER secondary to fever, cough; admitted wtih hyponatremia, UTI (current NA 128).  Clinical Impression  Upon evaluation, patient alert and oriented, follows all commands and demonstrates good insight/safety awareness.  Bilat UE/LE strength and ROM grossly symmetrical and WFL; no focal weakness noted.  Able to complete bed mobility and sit/stand transfers without assist device, mod indep; gait (x220') without assist device, distant sup.  Good gait mechanics and overall stability; good cadence and gait speed (10' walk, 6-7 seconds) without buckling, LOB or safety concerns (even with head turns, changes of direction and distractional conversation).  Does require 2 standing rest periods throughout distance to manage SOB (sats >92% on RA), but patient able to self-initiate and manage without cuing from therapist. Appears to be at baseline level of functional ability without need for acute PT services.  Will complete order at this time.  Please re-consult should needs change.  Okay for discharge home when medically appropriate.      Follow Up Recommendations No PT follow up    Equipment Recommendations       Recommendations for Other Services       Precautions / Restrictions Precautions Precautions: Fall Restrictions Weight Bearing Restrictions: No      Mobility  Bed Mobility Overal bed mobility: Modified Independent                Transfers Overall transfer level: Modified independent               General transfer comment: sit/stand without assist device from edge of bed, standard toilet, mod indep; good LE strength/power and control with movement transition  Ambulation/Gait Ambulation/Gait assistance: Supervision Ambulation Distance (Feet): 220 Feet Assistive device: None   Gait velocity: 10' walk time, 6-7  seconds   General Gait Details: reciprocal stepping pattern with good step height/length, good truncal rotation and arm swing; completes head turns and distractionary conversation without difficulty or gait disturbance.  Stairs            Wheelchair Mobility    Modified Rankin (Stroke Patients Only)       Balance Overall balance assessment: Needs assistance Sitting-balance support: No upper extremity supported;Feet supported Sitting balance-Leahy Scale: Normal     Standing balance support: No upper extremity supported Standing balance-Leahy Scale: Good                               Pertinent Vitals/Pain Pain Assessment: No/denies pain    Home Living Family/patient expects to be discharged to:: Private residence Living Arrangements: Spouse/significant other;Children Available Help at Discharge: Family;Available PRN/intermittently Type of Home: House Home Access: Level entry     Home Layout: One level Home Equipment: Walker - 2 wheels;Tub bench;Bedside commode;Grab bars - tub/shower Additional Comments: has 1 step inside home (just a few inches)    Prior Function Level of Independence: Independent         Comments: Pt ind with bathing, dressing, cooking.  She denies any falls in the past 6 months.  Has recently completed course of HHPT (was set to 'graduate' at next visit)     Hand Dominance        Extremity/Trunk Assessment   Upper Extremity Assessment Upper Extremity Assessment: Overall WFL for tasks assessed    Lower Extremity Assessment Lower Extremity Assessment: Overall WFL for tasks  assessed (grossly 4+/5 throughout)       Communication   Communication: No difficulties  Cognition Arousal/Alertness: Awake/alert Behavior During Therapy: WFL for tasks assessed/performed Overall Cognitive Status: Within Functional Limits for tasks assessed                                        General Comments      Exercises      Assessment/Plan    PT Assessment Patent does not need any further PT services  PT Problem List         PT Treatment Interventions      PT Goals (Current goals can be found in the Care Plan section)  Acute Rehab PT Goals Patient Stated Goal: to return home PT Goal Formulation: With patient Time For Goal Achievement: 03/08/17    Frequency     Barriers to discharge        Co-evaluation               AM-PAC PT "6 Clicks" Daily Activity  Outcome Measure Difficulty turning over in bed (including adjusting bedclothes, sheets and blankets)?: None Difficulty moving from lying on back to sitting on the side of the bed? : None Difficulty sitting down on and standing up from a chair with arms (e.g., wheelchair, bedside commode, etc,.)?: None Help needed moving to and from a bed to chair (including a wheelchair)?: None Help needed walking in hospital room?: None Help needed climbing 3-5 steps with a railing? : A Little 6 Click Score: 23    End of Session Equipment Utilized During Treatment: Gait belt Activity Tolerance: Patient tolerated treatment well Patient left: in chair;with call bell/phone within reach;with chair alarm set   PT Visit Diagnosis: Muscle weakness (generalized) (M62.81)    Time: 1740-8144 PT Time Calculation (min) (ACUTE ONLY): 19 min   Charges:   PT Evaluation $PT Eval Low Complexity: 1 Procedure     PT G Codes:   PT G-Codes **NOT FOR INPATIENT CLASS** Functional Assessment Tool Used: AM-PAC 6 Clicks Basic Mobility Functional Limitation: Mobility: Walking and moving around Mobility: Walking and Moving Around Current Status (Y1856): At least 1 percent but less than 20 percent impaired, limited or restricted Mobility: Walking and Moving Around Goal Status [phone]): At least 1 percent but less than 20 percent impaired, limited or restricted Mobility: Walking and Moving Around Discharge Status [phone]): At least 1 percent but less than 20 percent  impaired, limited or restricted     Lylie Blacklock H. Owens Shark, PT, DPT, NCS 03/08/17, 9:27 AM [phone]

## 2017-03-09 LAB — BASIC METABOLIC PANEL
Anion gap: 9 (ref 5–15)
BUN: 11 mg/dL (ref 6–20)
CHLORIDE: 99 mmol/L — AB (ref 101–111)
CO2: 25 mmol/L (ref 22–32)
Calcium: 8.9 mg/dL (ref 8.9–10.3)
Creatinine, Ser: 0.88 mg/dL (ref 0.44–1.00)
GFR calc non Af Amer: 60 mL/min — ABNORMAL LOW (ref >60)
Glucose, Bld: 115 mg/dL — ABNORMAL HIGH (ref 65–99)
POTASSIUM: 3.5 mmol/L (ref 3.5–5.1)
SODIUM: 133 mmol/L — AB (ref 135–145)

## 2017-03-09 LAB — CBC
HCT: 32.4 % — ABNORMAL LOW (ref 35.0–47.0)
HEMOGLOBIN: 11.1 g/dL — AB (ref 12.0–16.0)
MCH: 26.8 pg (ref 26.0–34.0)
MCHC: 34.3 g/dL (ref 32.0–36.0)
MCV: 78.1 fL — ABNORMAL LOW (ref 80.0–100.0)
Platelets: 183 10*3/uL (ref 150–440)
RBC: 4.14 MIL/uL (ref 3.80–5.20)
RDW: 17.8 % — ABNORMAL HIGH (ref 11.5–14.5)
WBC: 9.7 10*3/uL (ref 3.6–11.0)

## 2017-03-09 NOTE — Discharge Instructions (Signed)
Hyponatremia Hyponatremia is when the amount of salt (sodium) in your blood is too low. When salt levels are low, your cells absorb extra water and they swell. The swelling happens throughout the body, but it mostly affects the brain. Follow these instructions at home:  Take medicines only as told by your doctor. Many medicines can make this condition worse. Talk with your doctor about any medicines that you are currently taking.  Carefully follow a recommended diet as told by your doctor.  Carefully follow instructions from your doctor about fluid restrictions.  Keep all follow-up visits as told by your doctor. This is important.  Do not drink alcohol. Contact a doctor if:  You feel sicker to your stomach (nauseous).  You feel more confused.  You feel more tired (fatigued).  Your headache gets worse.  You feel weaker.  Your symptoms go away and then they come back.  You have trouble following the diet instructions. Get help right away if:  You start to twitch and shake (have a seizure).  You pass out (faint).  You keep having watery poop (diarrhea).  You keep throwing up (vomiting). This information is not intended to replace advice given to you by your health care provider. Make sure you discuss any questions you have with your health care provider. Document Released: 04/15/2011 Document Revised: 01/09/2016 Document Reviewed: 07/30/2014 Elsevier Interactive Patient Education  2018 [employer]  

## 2017-03-09 NOTE — Progress Notes (Signed)
Pt is being discharged today, Iv x1 removed. Discharge instructions reviewed with the patient and her husband, both verified understanding. 0 paper prescriptions given to them. All belongings packed and returned to the patient. She was rolled out in a wheelchair by staff.

## 2017-03-09 NOTE — Progress Notes (Signed)
[facility]  Patient Status: Active  [facility] is providing the following services: SN  If patient discharges after hours, please call [phone].   Janice Perkins 03/09/2017, 8:58 AM

## 2017-03-09 NOTE — Care Management Important Message (Signed)
Important Message  Patient Details  Name: Janice Perkins MRN: [id number] Date of Birth: May 15, 1933   Medicare Important Message Given:  N/A - LOS <3 / Initial given by admissions    Jolly Mango, RN 03/09/2017, 11:56 AM

## 2017-03-10 LAB — URINE CULTURE

## 2017-03-11 NOTE — Discharge Summary (Signed)
[facility] [place] NAME: Janice Perkins    MR#:  [id number]  DATE OF BIRTH:  [birth year]  DATE OF ADMISSION:  03/07/2017   ADMITTING PHYSICIAN: Idelle Crouch, MD  DATE OF DISCHARGE: 03/09/2017 12:08 PM  PRIMARY CARE PHYSICIAN: [name], MD   ADMISSION DIAGNOSIS:  Hyponatremia [E87.1] Urinary tract infection without hematuria, site unspecified [N39.0] Fatigue, unspecified type [R53.83] DISCHARGE DIAGNOSIS:  Principal Problem:   Hyponatremia Active Problems:   Generalized weakness   Dehydration   UTI (urinary tract infection)  SECONDARY DIAGNOSIS:   Past Medical History:  Diagnosis Date  . Asthma   . Breast cancer ([place]) 2011  . Bronchitis 04/2015  . Cancer [facility]) 2011   breast  . COPD (chronic obstructive pulmonary disease) ([place])   . Hypertension   . Shingles    10/2015   HOSPITAL COURSE:  This is an 83 year old female admitted for severe hyponatremia  * Severe hyponatremia - Likely due to dehydration poor by mouth intake due to constant nausea and also due to HCTZ which is stopped - The sodium is improved with IV hydration - Sodium has improved from 125->133  * Acute Viral exanthem - She reported high fever but none while in the Hospital  - UA does not look very impressive.  Her urine culture is growing E.fecalis which may be contaminant.  There is no other source of infection. - Continue Cipro for now which should cover Urine as well just if in case.  * Nausea - Likely due to severe constipation and opioids - She is feeling somewhat better after bowel movement.  * Severe constipation - She reports no bowel movement for 4-5 days - This may be the likely cause of her nausea along with her opioids - Continue aggressive bowel regimen which relieved her and had BM before D/C.  * Hypertension:  -Continue hydralazine, metoprolol and losartan.  - Stop hydrochlorothiazide as this is likely cause of her Low  Na.  * Permanent Atrial fibrillation:  rate controlled on metoprolol. Aspirin only for anticoagulation. High risk for fall so not a good candidate for any other anticoagulation.  * COPD: Continue inhaled corticosteroid. DuoNeb's as needed  DISCHARGE CONDITIONS:  stable CONSULTS OBTAINED:   DRUG ALLERGIES:   Allergies  Allergen Reactions  . Penicillins Hives    .Has patient had a PCN reaction causing immediate rash, facial/tongue/throat swelling, SOB or lightheadedness with hypotension: Unknown Has patient had a PCN reaction causing severe rash involving mucus membranes or skin necrosis: Unknown Has patient had a PCN reaction that required hospitalization: Unknown Has patient had a PCN reaction occurring within the last 10 years: Unknown If all of the above answers are "NO", then may proceed with Cephalosporin use.   . Captopril Other (See Comments)  . Enalapril Maleate     Other reaction(s): Unknown  . Seldane  [Terfenadine] Other (See Comments)  . Tape Itching  . Augmentin [Amoxicillin-Pot Clavulanate] Rash  . Biaxin [Clarithromycin] Rash, Other (See Comments) and Hives    Other reaction(s): Unknown   DISCHARGE MEDICATIONS:   Allergies as of 03/09/2017      Reactions   Penicillins Hives   .Has patient had a PCN reaction causing immediate rash, facial/tongue/throat swelling, SOB or lightheadedness with hypotension: Unknown Has patient had a PCN reaction causing severe rash involving mucus membranes or skin necrosis: Unknown Has patient had a PCN reaction that required hospitalization: Unknown Has patient had a PCN reaction occurring within the  last 10 years: Unknown If all of the above answers are "NO", then may proceed with Cephalosporin use.   Captopril Other (See Comments)   Enalapril Maleate    Other reaction(s): Unknown   Seldane  [terfenadine] Other (See Comments)   Tape Itching   Augmentin [amoxicillin-pot Clavulanate] Rash   Biaxin [clarithromycin] Rash, Other  (See Comments), Hives   Other reaction(s): Unknown      Medication List    STOP taking these medications   hydrochlorothiazide 25 MG tablet Commonly known as:  HYDRODIURIL     TAKE these medications   acetaminophen 325 MG tablet Commonly known as:  TYLENOL Take 2 tablets (650 mg total) by mouth every 6 (six) hours as needed for mild pain (or Fever >/= 101).   ALPRAZolam 0.25 MG tablet Commonly known as:  XANAX Take 1 tablet (0.25 mg total) by mouth 3 (three) times daily as needed for anxiety.   aspirin 81 MG tablet Take 81 mg by mouth daily.   atorvastatin 20 MG tablet Commonly known as:  LIPITOR Take 20 mg by mouth daily.   cholecalciferol 1000 units tablet Commonly known as:  VITAMIN D Take 1,000 Units by mouth daily.   docusate sodium 100 MG capsule Commonly known as:  COLACE Take 1 capsule (100 mg total) by mouth daily.   esomeprazole 40 MG capsule Commonly known as:  NEXIUM Take 40 mg by mouth daily at 12 noon.   FLUoxetine 20 MG capsule Commonly known as:  PROZAC Take 20 mg by mouth 2 (two) times daily.   fluticasone 50 MCG/ACT nasal spray Commonly known as:  FLONASE Place 2 sprays into both nostrils daily.   Fluticasone-Salmeterol 250-50 MCG/DOSE Aepb Commonly known as:  ADVAIR Inhale 1 puff into the lungs 2 (two) times daily.   hydrALAZINE 25 MG tablet Commonly known as:  APRESOLINE Take 1 tablet (25 mg total) by mouth every 6 (six) hours.   levothyroxine 75 MCG tablet Commonly known as:  SYNTHROID, LEVOTHROID Take 1 tablet (75 mcg total) by mouth daily before breakfast.   losartan 100 MG tablet Commonly known as:  COZAAR Take 1 tablet (100 mg total) by mouth daily.   magnesium oxide 400 MG tablet Commonly known as:  MAG-OX Take 400 mg by mouth 2 (two) times daily.   metoprolol succinate 100 MG 24 hr tablet Commonly known as:  TOPROL-XL Take 1 tablet (100 mg total) by mouth 2 (two) times daily with a meal. Take with or immediately following  a meal. What changed:  how much to take  additional instructions   ondansetron 4 MG disintegrating tablet Commonly known as:  ZOFRAN ODT Take 1 tablet (4 mg total) by mouth every 8 (eight) hours as needed for nausea or vomiting.   QUEtiapine 25 MG tablet Commonly known as:  SEROQUEL Take 1 tablet (25 mg total) by mouth at bedtime.        DISCHARGE INSTRUCTIONS:   DIET:  Regular diet DISCHARGE CONDITION:  Good ACTIVITY:  Activity as tolerated OXYGEN:  Home Oxygen: No.  Oxygen Delivery: room air DISCHARGE LOCATION:  home   If you experience worsening of your admission symptoms, develop shortness of breath, life threatening emergency, suicidal or homicidal thoughts you must seek medical attention immediately by calling 911 or calling your MD immediately  if symptoms less severe.  You Must read complete instructions/literature along with all the possible adverse reactions/side effects for all the Medicines you take and that have been prescribed to you. Take any new  Medicines after you have completely understood and accpet all the possible adverse reactions/side effects.   Please note  You were cared for by a hospitalist during your hospital stay. If you have any questions about your discharge medications or the care you received while you were in the hospital after you are discharged, you can call the unit and asked to speak with the hospitalist on call if the hospitalist that took care of you is not available. Once you are discharged, your primary care physician will handle any further medical issues. Please note that NO REFILLS for any discharge medications will be authorized once you are discharged, as it is imperative that you return to your primary care physician (or establish a relationship with a primary care physician if you do not have one) for your aftercare needs so that they can reassess your need for medications and monitor your lab values.    On the day of  Discharge:  VITAL SIGNS:  Blood pressure (!) 135/92, pulse 77, temperature 98.6 F (37 C), temperature source Oral, resp. rate 18, height 5' (1.524 m), weight 78.8 kg (173 lb 11.2 oz), SpO2 97 %. PHYSICAL EXAMINATION:  GENERAL:  83 y.o.-year-old patient lying in the bed with no acute distress.  EYES: Pupils equal, round, reactive to light and accommodation. No scleral icterus. Extraocular muscles intact.  HEENT: Head atraumatic, normocephalic. Oropharynx and nasopharynx clear.  NECK:  Supple, no jugular venous distention. No thyroid enlargement, no tenderness.  LUNGS: Normal breath sounds bilaterally, no wheezing, rales,rhonchi or crepitation. No use of accessory muscles of respiration.  CARDIOVASCULAR: S1, S2 normal. No murmurs, rubs, or gallops.  ABDOMEN: Soft, non-tender, non-distended. Bowel sounds present. No organomegaly or mass.  EXTREMITIES: No pedal edema, cyanosis, or clubbing.  NEUROLOGIC: Cranial nerves II through XII are intact. Muscle strength 5/5 in all extremities. Sensation intact. Gait not checked.  PSYCHIATRIC: The patient is alert and oriented x 3.  SKIN: No obvious rash, lesion, or ulcer.  DATA REVIEW:   CBC  Recent Labs Lab 03/09/17 0455  WBC 9.7  HGB 11.1*  HCT 32.4*  PLT 183    Chemistries   Recent Labs Lab 03/08/17 0357 03/09/17 0455  NA 128* 133*  K 4.0 3.5  CL 92* 99*  CO2 26 25  GLUCOSE 105* 115*  BUN 12 11  CREATININE 0.95 0.88  CALCIUM 8.8* 8.9  AST 25  --   ALT 16  --   ALKPHOS 54  --   BILITOT 0.8  --      Microbiology Results  Results for orders placed or performed during the hospital encounter of 03/07/17  Urine culture     Status: Abnormal   Collection Time: 03/07/17 11:23 AM  Result Value Ref Range Status   Specimen Description URINE, RANDOM  Final   Special Requests NONE  Final   Culture >=100,000 COLONIES/mL ENTEROCOCCUS FAECALIS (A)  Final   Report Status 03/10/2017 FINAL  Final   Organism ID, Bacteria ENTEROCOCCUS  FAECALIS (A)  Final      Susceptibility   Enterococcus faecalis - MIC*    AMPICILLIN <=2 SENSITIVE Sensitive     LEVOFLOXACIN 1 SENSITIVE Sensitive     NITROFURANTOIN <=16 SENSITIVE Sensitive     VANCOMYCIN <=0.5 SENSITIVE Sensitive     * >=100,000 COLONIES/mL ENTEROCOCCUS FAECALIS    RADIOLOGY:  No results found.   Management plans discussed with the patient, family and they are in agreement.  CODE STATUS: Prior   TOTAL TIME TAKING CARE  OF THIS PATIENT: 45 minutes.    Max Sane M.D on 03/11/2017 at 1:30 PM  Between 7am to 6pm - Pager - [phone]  After 6pm go to [email or web address] - Proofreader  [facility] [place] [facility]  Office  [phone]  CC: Primary care physician; [name], MD   Note: This dictation was prepared with Dragon dictation along with smaller phrase technology. Any transcriptional errors that result from this process are unintentional.

## 2017-03-25 ENCOUNTER — Other Ambulatory Visit: Payer: Self-pay | Admitting: Internal Medicine

## 2017-03-25 DIAGNOSIS — R0602 Shortness of breath: Secondary | ICD-10-CM

## 2017-03-25 DIAGNOSIS — J411 Mucopurulent chronic bronchitis: Secondary | ICD-10-CM

## 2017-03-29 ENCOUNTER — Emergency Department
Admission: EM | Admit: 2017-03-29 | Discharge: 2017-03-29 | Disposition: A | Discharge status: Home / Self Care | Payer: Medicare Other | Attending: Emergency Medicine | Admitting: Emergency Medicine

## 2017-03-29 ENCOUNTER — Emergency Department: Payer: Medicare Other

## 2017-03-29 ENCOUNTER — Encounter: Payer: Self-pay | Admitting: Emergency Medicine

## 2017-03-29 DIAGNOSIS — I10 Essential (primary) hypertension: Secondary | ICD-10-CM | POA: Insufficient documentation

## 2017-03-29 DIAGNOSIS — J90 Pleural effusion, not elsewhere classified: Secondary | ICD-10-CM | POA: Insufficient documentation

## 2017-03-29 DIAGNOSIS — Z7982 Long term (current) use of aspirin: Secondary | ICD-10-CM | POA: Insufficient documentation

## 2017-03-29 DIAGNOSIS — Z853 Personal history of malignant neoplasm of breast: Secondary | ICD-10-CM | POA: Insufficient documentation

## 2017-03-29 DIAGNOSIS — Z79899 Other long term (current) drug therapy: Secondary | ICD-10-CM | POA: Diagnosis not present

## 2017-03-29 DIAGNOSIS — J449 Chronic obstructive pulmonary disease, unspecified: Secondary | ICD-10-CM | POA: Insufficient documentation

## 2017-03-29 DIAGNOSIS — R0602 Shortness of breath: Secondary | ICD-10-CM | POA: Diagnosis present

## 2017-03-29 DIAGNOSIS — I4891 Unspecified atrial fibrillation: Secondary | ICD-10-CM | POA: Insufficient documentation

## 2017-03-29 HISTORY — DX: Unspecified atrial fibrillation: I48.91

## 2017-03-29 LAB — BASIC METABOLIC PANEL
Anion gap: 11 (ref 5–15)
BUN: 20 mg/dL (ref 6–20)
CHLORIDE: 100 mmol/L — AB (ref 101–111)
CO2: 27 mmol/L (ref 22–32)
CREATININE: 1 mg/dL (ref 0.44–1.00)
Calcium: 9.3 mg/dL (ref 8.9–10.3)
GFR calc non Af Amer: 51 mL/min — ABNORMAL LOW (ref >60)
GFR, EST AFRICAN AMERICAN: 59 mL/min — AB (ref >60)
Glucose, Bld: 139 mg/dL — ABNORMAL HIGH (ref 65–99)
POTASSIUM: 3.1 mmol/L — AB (ref 3.5–5.1)
SODIUM: 138 mmol/L (ref 135–145)

## 2017-03-29 LAB — CBC
HEMATOCRIT: 38.4 % (ref 35.0–47.0)
Hemoglobin: 12.5 g/dL (ref 12.0–16.0)
MCH: 26.8 pg (ref 26.0–34.0)
MCHC: 32.6 g/dL (ref 32.0–36.0)
MCV: 82.2 fL (ref 80.0–100.0)
PLATELETS: 232 10*3/uL (ref 150–440)
RBC: 4.68 MIL/uL (ref 3.80–5.20)
RDW: 21 % — ABNORMAL HIGH (ref 11.5–14.5)
WBC: 11.7 10*3/uL — AB (ref 3.6–11.0)

## 2017-03-29 LAB — TROPONIN I: Troponin I: 0.03 ng/mL (ref <0.03)

## 2017-03-29 MED ORDER — IOPAMIDOL (ISOVUE-370) INJECTION 76%
75.0000 mL | Freq: Once | INTRAVENOUS | Status: AC | PRN
  Administered 2017-03-29: 75 mL via INTRAVENOUS

## 2017-03-29 NOTE — Consult Note (Signed)
[facility] [place] NAME: Janice Perkins    MR#:  [id number]  DATE OF BIRTH:  January 16, 1933  DATE OF ADMISSION:  03/29/2017  PRIMARY CARE PHYSICIAN: [name], MD   REQUESTING/REFERRING PHYSICIAN: Darel Hong, MD  CHIEF COMPLAINT:   Chief Complaint  Patient presents with  . Shortness of Breath    HISTORY OF PRESENT ILLNESS:  Janice Perkins  is a 83 y.o. female with a known history of Asthma, A. fib, breast cancer was seen in consultation for dyspnea on exertion.  While in the ED she was noted to have large right-sided pleural effusion on chest x-ray.  All family is at the bedside. She does any other symptoms.  PAST MEDICAL HISTORY:   Past Medical History:  Diagnosis Date  . Asthma   . Atrial fibrillation ([place])   . Breast cancer ([place]) 2011  . Bronchitis 04/2015  . Cancer [facility]) 2011   breast  . COPD (chronic obstructive pulmonary disease) ([place])   . Hypertension   . Shingles    10/2015    PAST SURGICAL HISTOIRY:   Past Surgical History:  Procedure Laterality Date  . BREAST EXCISIONAL BIOPSY Right 11/29/13   two areas  . BREAST EXCISIONAL BIOPSY Right 10/30/2009   lumpectomy - radiation    SOCIAL HISTORY:   Social History  Substance Use Topics  . Smoking status: Never Smoker  . Smokeless tobacco: Never Used  . Alcohol use No    FAMILY HISTORY:   Family History  Problem Relation Age of Onset  . Hypertension Mother   . Heart disease Mother   . Cancer Mother   . Stroke Father   . Hypertension Father   . Breast cancer Sister     DRUG ALLERGIES:   Allergies  Allergen Reactions  . Penicillins Hives    .Has patient had a PCN reaction causing immediate rash, facial/tongue/throat swelling, SOB or lightheadedness with hypotension: Unknown Has patient had a PCN reaction causing severe rash involving mucus membranes or skin necrosis: Unknown Has patient had a PCN reaction that required hospitalization:  Unknown Has patient had a PCN reaction occurring within the last 10 years: Unknown If all of the above answers are "NO", then may proceed with Cephalosporin use.   . Captopril Other (See Comments)  . Enalapril Maleate     Other reaction(s): Unknown  . Seldane  [Terfenadine] Other (See Comments)  . Tape Itching  . Augmentin [Amoxicillin-Pot Clavulanate] Rash  . Biaxin [Clarithromycin] Rash, Other (See Comments) and Hives    Other reaction(s): Unknown    REVIEW OF SYSTEMS:  CONSTITUTIONAL: No fever, fatigue or weakness.  EYES: No blurred or double vision.  EARS, NOSE, AND THROAT: No tinnitus or ear pain.  RESPIRATORY: No cough, + shortness of breath, wheezing or hemoptysis.  CARDIOVASCULAR: No chest pain, orthopnea, edema.  GASTROINTESTINAL: No nausea, vomiting, diarrhea or abdominal pain.  GENITOURINARY: No dysuria, hematuria.  ENDOCRINE: No polyuria, nocturia,  HEMATOLOGY: No anemia, easy bruising or bleeding SKIN: No rash or lesion. MUSCULOSKELETAL: No joint pain or arthritis.   NEUROLOGIC: No tingling, numbness, weakness.  PSYCHIATRY: No anxiety or depression.  MEDICATIONS AT HOME:   Prior to Admission medications   Medication Sig Start Date End Date Taking? Authorizing Provider  acetaminophen (TYLENOL) 325 MG tablet Take 2 tablets (650 mg total) by mouth every 6 (six) hours as needed for mild pain (or Fever >/= 101). 12/22/16  Yes Gouru, Illene Silver, MD  albuterol (PROVENTIL HFA;VENTOLIN  HFA) 108 (90 Base) MCG/ACT inhaler Inhale 1-2 puffs into the lungs every 6 (six) hours as needed for wheezing or shortness of breath.   Yes [provider]  ALPRAZolam (XANAX) 0.25 MG tablet Take 1 tablet (0.25 mg total) by mouth 3 (three) times daily as needed for anxiety. 12/22/16  Yes Gouru, Illene Silver, MD  aspirin 81 MG tablet Take 81 mg by mouth daily.    Yes [provider]  atorvastatin (LIPITOR) 20 MG tablet Take 20 mg by mouth daily.   Yes [provider]  cholecalciferol  (VITAMIN D) 1000 units tablet Take 1,000 Units by mouth daily.   Yes [provider]  docusate sodium (COLACE) 100 MG capsule Take 1 capsule (100 mg total) by mouth daily. 12/22/16  Yes Gouru, Illene Silver, MD  esomeprazole (NEXIUM) 40 MG capsule Take 40 mg by mouth daily at 12 noon.   Yes [provider]  ferrous sulfate 325 (65 FE) MG tablet Take 325 mg by mouth daily with breakfast.   Yes [provider]  FLUoxetine (PROZAC) 20 MG capsule Take 20 mg by mouth 2 (two) times daily.   Yes [provider]  fluticasone (FLONASE) 50 MCG/ACT nasal spray Place 2 sprays into both nostrils daily.    Yes [provider]  Fluticasone-Salmeterol (ADVAIR) 250-50 MCG/DOSE AEPB Inhale 1 puff into the lungs 2 (two) times daily.   Yes [provider]  hydrALAZINE (APRESOLINE) 25 MG tablet Take 1 tablet (25 mg total) by mouth every 6 (six) hours. Patient taking differently: Take 25 mg by mouth 2 (two) times daily.  12/22/16  Yes Gouru, Illene Silver, MD  HYDROcodone-acetaminophen (NORCO/VICODIN) 5-325 MG tablet Take 1 tablet by mouth as needed.   Yes [provider]  levothyroxine (SYNTHROID, LEVOTHROID) 75 MCG tablet Take 1 tablet (75 mcg total) by mouth daily before breakfast. 12/23/16  Yes Gouru, Aruna, MD  losartan (COZAAR) 100 MG tablet Take 1 tablet (100 mg total) by mouth daily. 12/23/16  Yes Gouru, Illene Silver, MD  magnesium oxide (MAG-OX) 400 MG tablet Take 400 mg by mouth 2 (two) times daily.   Yes [provider]  metoprolol succinate (TOPROL-XL) 100 MG 24 hr tablet Take 1 tablet (100 mg total) by mouth 2 (two) times daily with a meal. Take with or immediately following a meal. Patient taking differently: Take 50 mg by mouth 2 (two) times daily with a meal. Take with or immediately following a meal. 12/22/16  Yes Gouru, Aruna, MD  ondansetron (ZOFRAN ODT) 4 MG disintegrating tablet Take 1 tablet (4 mg total) by mouth every 8 (eight) hours as needed for nausea or  vomiting. 01/21/17  Yes Vaughan Basta, MD  predniSONE (DELTASONE) 10 MG tablet Take 10 mg by mouth daily with breakfast.   Yes [provider]  QUEtiapine (SEROQUEL) 25 MG tablet Take 1 tablet (25 mg total) by mouth at bedtime. Patient not taking: Reported on 03/07/2017 12/22/16   Nicholes Mango, MD   VITAL SIGNS:  Blood pressure (!) 146/75, pulse 68, temperature 97.9 F (36.6 C), temperature source Oral, resp. rate 16, height 5' (1.524 m), weight 78.5 kg (173 lb), SpO2 96 %. PHYSICAL EXAMINATION:  GENERAL:  83 y.o.-year-old patient lying in the bed with no acute distress.  EYES: Pupils equal, round, reactive to light and accommodation. No scleral icterus. Extraocular muscles intact.  HEENT: Head atraumatic, normocephalic. Oropharynx and nasopharynx clear.  NECK:  Supple, no jugular venous distention. No thyroid enlargement, no tenderness.  LUNGS: Normal breath sounds bilaterally,  no wheezing, rales,rhonchi or crepitation. No use of accessory muscles of respiration.  CARDIOVASCULAR: S1, S2 normal. No murmurs, rubs, or gallops.  ABDOMEN: Soft, nontender, nondistended. Bowel sounds present. No organomegaly or mass.  EXTREMITIES: No pedal edema, cyanosis, or clubbing.  NEUROLOGIC: Cranial nerves II through XII are intact. Muscle strength 5/5 in all extremities. Sensation intact. Gait not checked.  PSYCHIATRIC: The patient is alert and oriented x 3.  SKIN: No obvious rash, lesion, or ulcer.  LABORATORY PANEL:   CBC  Recent Labs Lab 03/29/17 1503  WBC 11.7*  HGB 12.5  HCT 38.4  PLT 232   ------------------------------------------------------------------------------------------------------------------  Chemistries   Recent Labs Lab 03/29/17 1503  NA 138  K 3.1*  CL 100*  CO2 27  GLUCOSE 139*  BUN 20  CREATININE 1.00  CALCIUM 9.3   ------------------------------------------------------------------------------------------------------------------  Cardiac  Enzymes  Recent Labs Lab 03/29/17 1650  TROPONINI 0.03*   ------------------------------------------------------------------------------------------------------------------  RADIOLOGY:  Dg Chest 2 View  Result Date: 03/29/2017 CLINICAL DATA:  Dyspnea with exertion. EXAM: CHEST  2 VIEW COMPARISON:  Radiographs of March 07, 2017. FINDINGS: Stable cardiomediastinal silhouette. Stable mild central pulmonary vascular congestion is noted. Stable bibasilar interstitial densities are noted which may represent scarring or subsegmental atelectasis. No pneumothorax or pleural effusion is noted. Status post left total shoulder arthroplasty. IMPRESSION: Stable cardiomediastinal silhouette with mild central pulmonary vascular congestion. Stable bibasilar subsegmental atelectasis or scarring. Electronically Signed   By: Marijo Conception, M.D.   On: 03/29/2017 15:33   Ct Angio Chest Pe W/cm &/or Wo Cm  Result Date: 03/29/2017 CLINICAL DATA:  Dyspnea with exertion. History of atrial fibrillation and right-sided breast cancer. EXAM: CT ANGIOGRAPHY CHEST WITH CONTRAST TECHNIQUE: Multidetector CT imaging of the chest was performed using the standard protocol during bolus administration of intravenous contrast. Multiplanar CT image reconstructions and MIPs were obtained to evaluate the vascular anatomy. CONTRAST:  75 mL Isovue 370 COMPARISON:  Chest radiograph 03/29/2017 FINDINGS: Cardiovascular: Contrast injection is sufficient to demonstrate satisfactory opacification of the pulmonary arteries to the segmental level. There is no pulmonary embolus. The main pulmonary artery is within normal limits for size. There is no CT evidence of acute right heart strain. The visualized aorta is normal aside from mild atherosclerotic calcification. There is a normal 3-vessel arch branching pattern. Heart size is enlarged. No pericardial effusion. Mediastinum/Nodes: No mediastinal, hilar or axillary lymphadenopathy. The visualized  thyroid and thoracic esophageal course are unremarkable. Lungs/Pleura: There is a large right pleural effusion with associated atelectasis. No nodules or masses. No focal consolidation. Mild pulmonary edema. Upper Abdomen: Contrast bolus timing is not optimized for evaluation of the abdominal organs. Within this limitation, the visualized organs of the upper abdomen are normal. Musculoskeletal: No chest wall abnormality. No acute or significant osseous findings. Review of the MIP images confirms the above findings. IMPRESSION: 1. No pulmonary embolus. 2. Cardiomegaly and mild pulmonary edema. 3. Large right pleural effusion with associated atelectasis. 4.  Aortic Atherosclerosis (ICD10-I70.0). Electronically Signed   By: Ulyses Jarred M.D.   On: 03/29/2017 17:25    EKG:   Orders placed or performed during the hospital encounter of 03/29/17  . ED EKG within 10 minutes  . ED EKG within 10 minutes    IMPRESSION AND PLAN:   known history of Asthma, A. fib, breast cancer was seen in consultation for dyspnea on exertion.  * Large right pleural effusion - Case discussed with patient, family, ER doctor, interventional radiology (Dr Kathlene Cote). - IR is able  to coordinate ultrasound-guided thoracentesis as an outpatient.  They will call her tomorrow morning to schedule this and likely get it done this week, most likely tomorrow - At this time.  There is no further need to bring her in the hospital  I have discussed the case with patient, family and ER doctor and they are all in agreement - She can continue using her oxygen as needed, on ambulation    All the records are reviewed and case discussed with Consulting provider. Management plans discussed with the patient, family and they are in agreement.  CODE STATUS: Full code  TOTAL TIME TAKING CARE OF THIS PATIENT: 45 minutes.    Max Sane M.D on 03/29/2017 at 7:06 PM  Between 7am to 6pm - Pager - [phone]  After 6pm go to [email or web address] -  Proofreader  [facility] [place] [facility]  Office  [phone]  CC: Primary care Physician: [name], MD   Note: This dictation was prepared with Dragon dictation along with smaller phrase technology. Any transcriptional errors that result from this process are unintentional.

## 2017-03-29 NOTE — ED Triage Notes (Signed)
Pt reports increasing dyspnea with exertion and while at rest. Pt reports PCP recently diagnosed her with bronchitis. Pt reports productive cough. Denies chest pain. Pt speaking in complete sentences without difficulty. Pt reports history of a-fib.

## 2017-03-29 NOTE — ED Provider Notes (Signed)
[facility] Emergency Department Provider Note  ____________________________________________   First MD Initiated Contact with Patient 03/29/17 1642     (approximate)  I have reviewed the triage vital signs and the nursing notes.   HISTORY  Chief Complaint Shortness of Breath   HPI Janice Perkins is a 83 y.o. female who self presents to the emergency Department with roughly 1 week of exertional shortness of breath and fatigue. She normally is quite active and does not get winded although now when she walks throughout her house even to the kitchen she become short of breath and has to sit down.She has no chest pain. She does have a history of COPD but does not require oxygen.   Past Medical History:  Diagnosis Date  . Asthma   . Atrial fibrillation ([place])   . Breast cancer ([place]) 2011  . Bronchitis 04/2015  . Cancer [facility]) 2011   breast  . COPD (chronic obstructive pulmonary disease) ([place])   . Hypertension   . Shingles    10/2015    Patient Active Problem List   Diagnosis Date Noted  . Generalized weakness 03/07/2017  . Hyponatremia 03/07/2017  . Dehydration 03/07/2017  . UTI (urinary tract infection) 03/07/2017  . HCAP (healthcare-associated pneumonia) 01/19/2017  . AKI (acute kidney injury) ([place]) 12/18/2016  . Primary cancer of upper outer quadrant of right female breast ([place]) 04/07/2016  . Lumbar radiculopathy 03/23/2016  . Spinal stenosis, lumbar region, with neurogenic claudication 03/23/2016  . DDD (degenerative disc disease), lumbar 01/14/2015  . Lumbosacral facet joint syndrome ([place]) 01/14/2015  . Sacroiliac joint dysfunction 01/14/2015  . Greater trochanteric bursitis 01/14/2015  . Bilateral occipital neuralgia 01/14/2015    Past Surgical History:  Procedure Laterality Date  . BREAST EXCISIONAL BIOPSY Right 11/29/13   two areas  . BREAST EXCISIONAL BIOPSY Right 10/30/2009   lumpectomy - radiation    Prior to Admission medications    Medication Sig Start Date End Date Taking? Authorizing Provider  acetaminophen (TYLENOL) 325 MG tablet Take 2 tablets (650 mg total) by mouth every 6 (six) hours as needed for mild pain (or Fever >/= 101). 12/22/16  Yes Gouru, Illene Silver, MD  albuterol (PROVENTIL HFA;VENTOLIN HFA) 108 (90 Base) MCG/ACT inhaler Inhale 1-2 puffs into the lungs every 6 (six) hours as needed for wheezing or shortness of breath.   Yes [provider]  ALPRAZolam (XANAX) 0.25 MG tablet Take 1 tablet (0.25 mg total) by mouth 3 (three) times daily as needed for anxiety. 12/22/16  Yes Gouru, Illene Silver, MD  aspirin 81 MG tablet Take 81 mg by mouth daily.    Yes [provider]  atorvastatin (LIPITOR) 20 MG tablet Take 20 mg by mouth daily.   Yes [provider]  cholecalciferol (VITAMIN D) 1000 units tablet Take 1,000 Units by mouth daily.   Yes [provider]  docusate sodium (COLACE) 100 MG capsule Take 1 capsule (100 mg total) by mouth daily. 12/22/16  Yes Gouru, Illene Silver, MD  esomeprazole (NEXIUM) 40 MG capsule Take 40 mg by mouth daily at 12 noon.   Yes [provider]  ferrous sulfate 325 (65 FE) MG tablet Take 325 mg by mouth daily with breakfast.   Yes [provider]  FLUoxetine (PROZAC) 20 MG capsule Take 20 mg by mouth 2 (two) times daily.   Yes [provider]  fluticasone (FLONASE) 50 MCG/ACT nasal spray Place 2 sprays into both nostrils daily.    Yes [provider]  Fluticasone-Salmeterol (  ADVAIR) 250-50 MCG/DOSE AEPB Inhale 1 puff into the lungs 2 (two) times daily.   Yes [provider]  hydrALAZINE (APRESOLINE) 25 MG tablet Take 1 tablet (25 mg total) by mouth every 6 (six) hours. Patient taking differently: Take 25 mg by mouth 2 (two) times daily.  12/22/16  Yes Gouru, Illene Silver, MD  HYDROcodone-acetaminophen (NORCO/VICODIN) 5-325 MG tablet Take 1 tablet by mouth as needed.   Yes [provider]  levothyroxine (SYNTHROID, LEVOTHROID) 75  MCG tablet Take 1 tablet (75 mcg total) by mouth daily before breakfast. 12/23/16  Yes Gouru, Aruna, MD  losartan (COZAAR) 100 MG tablet Take 1 tablet (100 mg total) by mouth daily. 12/23/16  Yes Gouru, Illene Silver, MD  magnesium oxide (MAG-OX) 400 MG tablet Take 400 mg by mouth 2 (two) times daily.   Yes [provider]  metoprolol succinate (TOPROL-XL) 100 MG 24 hr tablet Take 1 tablet (100 mg total) by mouth 2 (two) times daily with a meal. Take with or immediately following a meal. Patient taking differently: Take 50 mg by mouth 2 (two) times daily with a meal. Take with or immediately following a meal. 12/22/16  Yes Gouru, Aruna, MD  ondansetron (ZOFRAN ODT) 4 MG disintegrating tablet Take 1 tablet (4 mg total) by mouth every 8 (eight) hours as needed for nausea or vomiting. 01/21/17  Yes Vaughan Basta, MD  predniSONE (DELTASONE) 10 MG tablet Take 10 mg by mouth daily with breakfast.   Yes [provider]  QUEtiapine (SEROQUEL) 25 MG tablet Take 1 tablet (25 mg total) by mouth at bedtime. Patient not taking: Reported on 03/07/2017 12/22/16   Nicholes Mango, MD    Allergies Penicillins; Captopril; Enalapril maleate; Seldane  [terfenadine]; Tape; Augmentin [amoxicillin-pot clavulanate]; and Biaxin [clarithromycin]  Family History  Problem Relation Age of Onset  . Hypertension Mother   . Heart disease Mother   . Cancer Mother   . Stroke Father   . Hypertension Father   . Breast cancer Sister     Social History Social History  Substance Use Topics  . Smoking status: Never Smoker  . Smokeless tobacco: Never Used  . Alcohol use No    Review of Systems Constitutional: No fever/chills Eyes: No visual changes. ENT: No sore throat. Cardiovascular: Positive chest pain. Respiratory: Positive shortness of breath. Gastrointestinal: No abdominal pain.  No nausea, no vomiting.  No diarrhea.  No constipation. Genitourinary: Negative for dysuria. Musculoskeletal: Negative for back  pain. Skin: Negative for rash. Neurological: Negative for headaches, focal weakness or numbness.   ____________________________________________   PHYSICAL EXAM:  VITAL SIGNS: ED Triage Vitals  Enc Vitals Group     BP 03/29/17 1457 (!) 146/89     Pulse Rate 03/29/17 1457 68     Resp 03/29/17 1457 20     Temp 03/29/17 1457 97.9 F (36.6 C)     Temp Source 03/29/17 1457 Oral     SpO2 03/29/17 1457 96 %     Weight 03/29/17 1458 173 lb (78.5 kg)     Height 03/29/17 1458 5' (1.524 m)     Head Circumference --      Peak Flow --      Pain Score 03/29/17 1457 0     Pain Loc --      Pain Edu? --      Excl. in [place]? --     Constitutional: Alert and oriented 4 appears somewhat uncomfortable nontoxic no diaphoresis speaks in full clear sentences Eyes: PERRL EOMI. Head: Atraumatic.  Nose: No congestion/rhinnorhea. Mouth/Throat: No trismus Neck: No stridor.   Cardiovascular: Normal rate, regular rhythm. Grossly normal heart sounds.  Good peripheral circulation. Respiratory: Slightly increased respiratory effort decreased breath sounds on the right Gastrointestinal: Soft nontender Musculoskeletal: No lower extremity edema   Neurologic:  Normal speech and language. No gross focal neurologic deficits are appreciated. Skin:  Skin is warm, dry and intact. No rash noted. Psychiatric: Mood and affect are normal. Speech and behavior are normal.    ____________________________________________   DIFFERENTIAL includes but not limited to  Pulmonary lungs, pneumothorax, pneumonia, acute coronary syndrome, fluid overload ____________________________________________   LABS (all labs ordered are listed, but only abnormal results are displayed)  Labs Reviewed  BASIC METABOLIC PANEL - Abnormal; Notable for the following:       Result Value   Potassium 3.1 (*)    Chloride 100 (*)    Glucose, Bld 139 (*)    GFR calc non Af Amer 51 (*)    GFR calc Af Amer 59 (*)    All other components  within normal limits  CBC - Abnormal; Notable for the following:    WBC 11.7 (*)    RDW 21.0 (*)    All other components within normal limits  TROPONIN I - Abnormal; Notable for the following:    Troponin I 0.03 (*)    All other components within normal limits    Slightly elevated white count is nonspecific __________________________________________  EKG  ED ECG REPORT I, Darel Hong, the attending physician, personally viewed and interpreted this ECG.  Date: 03/29/2017 Rate: 68 Rhythm: Atrial fibrillation QRS Axis: normal Intervals: normal ST/T Wave abnormalities: normal Narrative Interpretation: Abnormal  ____________________________________________  RADIOLOGY  CT pulmonary angiogram with large pleural effusion on the right no pulmonary embolism____________________________________________   PROCEDURES  Procedure(s) performed: no  Procedures  Critical Care performed: no  Observation: no ____________________________________________   INITIAL IMPRESSION / ASSESSMENT AND PLAN / ED COURSE  Pertinent labs & imaging results that were available during my care of the patient were reviewed by me and considered in my medical decision making (see chart for details).       ----------------------------------------- 6:17 PM on 03/29/2017 -----------------------------------------  Fortunately the patient's CT scan is negative for pulmonary embolism although it does show a large right-sided pleural effusion which is new. The patient can only walk roughly 50 feet before getting winded and having to rest and when I walked her myself she desaturated down to 89%. Given the severity of her oxygen requirements and exertional shortness of breath she requires inpatient admission for IR guided drainage of her pleural effusion and workup. _________________________________________  ----------------------------------------- 7:05 PM on  03/29/2017 -----------------------------------------  Dr. Manuella Ghazi the hospitalist came and evaluated the patient and he is very familiar with her. She has a known breast cancer and this effusion is likely malignant. He then spoke with on-call interventional radiology and scheduled an IR guided thoracentesis tomorrow. The patient does have home oxygen and she's feels well enough to go home with follow-up tomorrow. The patient be discharged home.___   FINAL CLINICAL IMPRESSION(S) / ED DIAGNOSES  Final diagnoses:  Pleural effusion  Shortness of breath      NEW MEDICATIONS STARTED DURING THIS VISIT:  Discharge Medication List as of 03/29/2017  7:07 PM       Note:  This document was prepared using Dragon voice recognition software and may include unintentional dictation errors.     Darel Hong, MD 03/29/17 2332

## 2017-03-29 NOTE — ED Notes (Signed)
Pt O2 dropped to 89% while ambulating

## 2017-03-29 NOTE — Discharge Instructions (Signed)
Please keep your appointment with interventional radiology first thing tomorrow morning for drainage of your effusion. Return to the emergency department sooner for any concerns such as chest pain, shortness of breath, or any other issues.  It was a pleasure to take care of you today, and thank you for coming to our emergency department.  If you have any questions or concerns before leaving please ask the nurse to grab me and I'm more than happy to go through your aftercare instructions again.  If you were prescribed any opioid pain medication today such as Norco, Vicodin, Percocet, morphine, hydrocodone, or oxycodone please make sure you do not drive when you are taking this medication as it can alter your ability to drive safely.  If you have any concerns once you are home that you are not improving or are in fact getting worse before you can make it to your follow-up appointment, please do not hesitate to call 911 and come back for further evaluation.  Darel Hong, MD  Results for orders placed or performed during the hospital encounter of 47/65/46  Basic metabolic panel  Result Value Ref Range   Sodium 138 135 - 145 mmol/L   Potassium 3.1 (L) 3.5 - 5.1 mmol/L   Chloride 100 (L) 101 - 111 mmol/L   CO2 27 22 - 32 mmol/L   Glucose, Bld 139 (H) 65 - 99 mg/dL   BUN 20 6 - 20 mg/dL   Creatinine, Ser 1.00 0.44 - 1.00 mg/dL   Calcium 9.3 8.9 - 10.3 mg/dL   GFR calc non Af Amer 51 (L) >60 mL/min   GFR calc Af Amer 59 (L) >60 mL/min   Anion gap 11 5 - 15  CBC  Result Value Ref Range   WBC 11.7 (H) 3.6 - 11.0 K/uL   RBC 4.68 3.80 - 5.20 MIL/uL   Hemoglobin 12.5 12.0 - 16.0 g/dL   HCT 38.4 35.0 - 47.0 %   MCV 82.2 80.0 - 100.0 fL   MCH 26.8 26.0 - 34.0 pg   MCHC 32.6 32.0 - 36.0 g/dL   RDW 21.0 (H) 11.5 - 14.5 %   Platelets 232 150 - 440 K/uL  Troponin I  Result Value Ref Range   Troponin I 0.03 (HH) <0.03 ng/mL   Dg Chest 2 View  Result Date: 03/29/2017 CLINICAL DATA:  Dyspnea  with exertion. EXAM: CHEST  2 VIEW COMPARISON:  Radiographs of March 07, 2017. FINDINGS: Stable cardiomediastinal silhouette. Stable mild central pulmonary vascular congestion is noted. Stable bibasilar interstitial densities are noted which may represent scarring or subsegmental atelectasis. No pneumothorax or pleural effusion is noted. Status post left total shoulder arthroplasty. IMPRESSION: Stable cardiomediastinal silhouette with mild central pulmonary vascular congestion. Stable bibasilar subsegmental atelectasis or scarring. Electronically Signed   By: Marijo Conception, M.D.   On: 03/29/2017 15:33   Dg Chest 2 View  Result Date: 03/07/2017 CLINICAL DATA:  Chills since Thursday, nausea this week, began taking Z-Pak and prednisone yesterday, dehydration and poor p.o. intake, headache, weakness, history RIGHT breast cancer,hypertension, COPD EXAM: CHEST  2 VIEW COMPARISON:  01/23/2017 FINDINGS: Enlargement of cardiac silhouette with slight pulmonary vascular congestion. Mediastinal contours normal with atherosclerotic calcification aorta. Chronic bronchitic changes with slight accentuation of interstitial markings versus previous exam question minimal pulmonary edema. No segmental consolidation, pleural effusion or pneumothorax. Bones diffusely demineralized. LEFT shoulder prosthesis. IMPRESSION: Enlargement of cardiac silhouette with pulmonary vascular congestion. Bronchitic changes and question minimal pulmonary edema. Electronically Signed   By:  Lavonia Dana M.D.   On: 03/07/2017 11:25   Ct Head Wo Contrast  Result Date: 03/07/2017 CLINICAL DATA:  Chills since Thursday, nausea this week, began taking Z-Pak and prednisone yesterday, dehydration and poor p.o. intake, headache, weakness, history RIGHT breast cancer, hypertension, COPD EXAM: CT HEAD WITHOUT CONTRAST TECHNIQUE: Contiguous axial images were obtained from the base of the skull through the vertex without intravenous contrast. Sagittal and  coronal MPR images reconstructed from axial data set. COMPARISON:  12/18/2016 FINDINGS: Brain: Generalized atrophy. Normal ventricular morphology. No midline shift or mass effect. Small vessel chronic ischemic changes of deep cerebral white matter. No intracranial hemorrhage, mass lesion, evidence of acute infarction, or extra-axial fluid collection. Vascular: Atherosclerotic calcifications at the carotid siphons. Skull: Intact though demineralized Sinuses/Orbits: Clear Other: N/A IMPRESSION: Atrophy with small vessel chronic ischemic changes of deep cerebral white matter. No acute intracranial abnormalities. Electronically Signed   By: Lavonia Dana M.D.   On: 03/07/2017 11:14   Ct Angio Chest Pe W/cm &/or Wo Cm  Result Date: 03/29/2017 CLINICAL DATA:  Dyspnea with exertion. History of atrial fibrillation and right-sided breast cancer. EXAM: CT ANGIOGRAPHY CHEST WITH CONTRAST TECHNIQUE: Multidetector CT imaging of the chest was performed using the standard protocol during bolus administration of intravenous contrast. Multiplanar CT image reconstructions and MIPs were obtained to evaluate the vascular anatomy. CONTRAST:  75 mL Isovue 370 COMPARISON:  Chest radiograph 03/29/2017 FINDINGS: Cardiovascular: Contrast injection is sufficient to demonstrate satisfactory opacification of the pulmonary arteries to the segmental level. There is no pulmonary embolus. The main pulmonary artery is within normal limits for size. There is no CT evidence of acute right heart strain. The visualized aorta is normal aside from mild atherosclerotic calcification. There is a normal 3-vessel arch branching pattern. Heart size is enlarged. No pericardial effusion. Mediastinum/Nodes: No mediastinal, hilar or axillary lymphadenopathy. The visualized thyroid and thoracic esophageal course are unremarkable. Lungs/Pleura: There is a large right pleural effusion with associated atelectasis. No nodules or masses. No focal consolidation. Mild  pulmonary edema. Upper Abdomen: Contrast bolus timing is not optimized for evaluation of the abdominal organs. Within this limitation, the visualized organs of the upper abdomen are normal. Musculoskeletal: No chest wall abnormality. No acute or significant osseous findings. Review of the MIP images confirms the above findings. IMPRESSION: 1. No pulmonary embolus. 2. Cardiomegaly and mild pulmonary edema. 3. Large right pleural effusion with associated atelectasis. 4.  Aortic Atherosclerosis (ICD10-I70.0). Electronically Signed   By: Ulyses Jarred M.D.   On: 03/29/2017 17:25

## 2017-03-30 ENCOUNTER — Ambulatory Visit
Admission: RE | Admit: 2017-03-30 | Discharge: 2017-03-30 | Disposition: A | Discharge status: Home / Self Care | Payer: Medicare Other | Source: Ambulatory Visit | Attending: Internal Medicine | Admitting: Internal Medicine

## 2017-03-30 ENCOUNTER — Other Ambulatory Visit: Payer: Self-pay | Admitting: Internal Medicine

## 2017-03-30 ENCOUNTER — Ambulatory Visit
Admission: RE | Admit: 2017-03-30 | Discharge: 2017-03-30 | Disposition: A | Discharge status: Home / Self Care | Payer: Medicare Other | Source: Ambulatory Visit | Attending: Diagnostic Radiology | Admitting: Diagnostic Radiology

## 2017-03-30 DIAGNOSIS — Z9889 Other specified postprocedural states: Secondary | ICD-10-CM

## 2017-03-30 DIAGNOSIS — J90 Pleural effusion, not elsewhere classified: Secondary | ICD-10-CM | POA: Insufficient documentation

## 2017-03-30 LAB — LACTATE DEHYDROGENASE, PLEURAL OR PERITONEAL FLUID: LD FL: 83 U/L — AB (ref 3–23)

## 2017-03-30 LAB — BODY FLUID CELL COUNT WITH DIFFERENTIAL
Eos, Fluid: 0 %
Lymphs, Fluid: 55 %
Monocyte-Macrophage-Serous Fluid: 36 %
Neutrophil Count, Fluid: 9 %
OTHER CELLS FL: 0 %
Total Nucleated Cell Count, Fluid: 9 cu mm

## 2017-03-30 LAB — GLUCOSE, PLEURAL OR PERITONEAL FLUID: Glucose, Fluid: 108 mg/dL

## 2017-03-30 LAB — PROTEIN, PLEURAL OR PERITONEAL FLUID: Total protein, fluid: <3 g/dL

## 2017-03-30 NOTE — Discharge Instructions (Signed)
Thoracentesis, Care After °Refer to this sheet in the next few weeks. These instructions provide you with information about caring for yourself after your procedure. Your health care provider may also give you more specific instructions. Your treatment has been planned according to current medical practices, but problems sometimes occur. Call your health care provider if you have any problems or questions after your procedure. °What can I expect after the procedure? °After your procedure, it is common to have pain at the puncture site. °Follow these instructions at home: °· Take medicines only as directed by your health care provider. °· You may return to your normal diet and normal activities as directed by your health care provider. °· Drink enough fluid to keep your urine clear or pale yellow. °· Do not take baths, swim, or use a hot tub until your health care provider approves. °· Follow your health care provider's instructions about: °? Puncture site care. °? Bandage (dressing) changes and removal. °· Check your puncture site every day for signs of infection. Watch for: °? Redness, swelling, or pain. °? Fluid, blood, or pus. °· Keep all follow-up visits as directed by your health care provider. This is important. °Contact a health care provider if: °· You have redness, swelling, or pain at your puncture site. °· You have fluid, blood, or pus coming from your puncture site. °· You have a fever. °· You have chills. °· You have nausea or vomiting. °· You have trouble breathing. °· You develop a worsening cough. °Get help right away if: °· You have extreme shortness of breath. °· You develop chest pain. °· You faint or feel light-headed. °This information is not intended to replace advice given to you by your health care provider. Make sure you discuss any questions you have with your health care provider. °Document Released: 08/24/2014 Document Revised: 04/04/2016 Document Reviewed: 05/15/2014 °Elsevier  Interactive Patient Education © 2018 [employer] ° °

## 2017-03-30 NOTE — Procedures (Signed)
US guided right thoracentesis.  Removed 500 ml of yellow fluid.  Minimal blood loss and no immediate complication.   CXR pending.

## 2017-03-31 LAB — MISC LABCORP TEST (SEND OUT): LABCORP TEST CODE: 19588

## 2017-04-02 ENCOUNTER — Ambulatory Visit: Payer: Medicare Other

## 2017-04-03 LAB — BODY FLUID CULTURE: CULTURE: NO GROWTH

## 2017-04-05 LAB — CYTOLOGY - NON PAP

## 2017-04-06 ENCOUNTER — Ambulatory Visit: Payer: Medicare Other

## 2017-04-06 NOTE — Progress Notes (Signed)
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  INTERVAL HISTORY:  Patient returns to clinic today for routine yearly follow-up. She has now completed 5 years of an aromatase inhibitor. She currently feels well and is asymptomatic. She has no neurologic complaints. She denies any recent fevers. She has a good appetite and denies weight loss. She denies any chest pain or shortness of breath.  She denies any nausea, vomiting, constipation , or diarrhea. She has no urinary complaints. Patient feels at her baseline and offers no specific complaints today.  REVIEW OF SYSTEMS:   Review of Systems  Constitutional: Negative.  Negative for fever, malaise/fatigue and weight loss.  Respiratory: Negative.  Negative for cough and shortness of breath.   Cardiovascular: Negative.  Negative for chest pain and leg swelling.  Gastrointestinal: Negative.  Negative for abdominal pain.  Genitourinary: Negative.   Musculoskeletal: Positive for back pain and joint pain.  Skin: Negative.  Negative for rash.  Neurological: Negative.  Negative for sensory change and weakness.  Psychiatric/Behavioral: Negative.  The patient is not nervous/anxious.     As per HPI. Otherwise, a complete review of systems is negative.  PAST MEDICAL HISTORY: Past Medical History:  Diagnosis Date  . Asthma   . Atrial fibrillation ([place])   . Breast cancer ([place]) 2011  . Bronchitis 04/2015  . Cancer [facility]) 2011   breast  . COPD (chronic obstructive pulmonary disease) ([place])   . Hypertension   . Shingles    10/2015    PAST SURGICAL HISTORY: Past Surgical History:  Procedure Laterality Date  . BREAST EXCISIONAL BIOPSY Right 11/29/13   two areas  . BREAST EXCISIONAL BIOPSY Right 10/30/2009   lumpectomy - radiation    FAMILY HISTORY Family History  Problem Relation Age of Onset  . Hypertension Mother   . Heart disease Mother   . Cancer Mother   . Stroke Father   . Hypertension Father   . Breast cancer Sister        ADVANCED DIRECTIVES:    HEALTH MAINTENANCE: Social History  Substance Use Topics  . Smoking status: Never Smoker  . Smokeless tobacco: Never Used  . Alcohol use No     Colonoscopy:  PAP:  Bone density:  Lipid panel:  Allergies  Allergen Reactions  . Penicillins Hives    .Has patient had a PCN reaction causing immediate rash, facial/tongue/throat swelling, SOB or lightheadedness with hypotension: Unknown Has patient had a PCN reaction causing severe rash involving mucus membranes or skin necrosis: Unknown Has patient had a PCN reaction that required hospitalization: Unknown Has patient had a PCN reaction occurring within the last 10 years: Unknown If all of the above answers are "NO", then may proceed with Cephalosporin use.   . Captopril Other (See Comments)  . Enalapril Maleate     Other reaction(s): Unknown  . Seldane  [Terfenadine] Other (See Comments)  . Tape Itching  . Augmentin [Amoxicillin-Pot Clavulanate] Rash  . Biaxin [Clarithromycin] Rash, Other (See Comments) and Hives    Other reaction(s): Unknown    Current Outpatient Prescriptions  Medication Sig Dispense Refill  . acetaminophen (TYLENOL) 325 MG tablet Take 2 tablets (650 mg total) by mouth every 6 (six) hours as needed for mild pain (or  Fever >/= 101).    Marland Kitchen albuterol (PROVENTIL HFA;VENTOLIN HFA) 108 (90 Base) MCG/ACT inhaler Inhale 1-2 puffs into the lungs every 6 (six) hours as needed for wheezing or shortness of breath.    . ALPRAZolam (XANAX) 0.25 MG tablet Take 1 tablet (0.25 mg total) by mouth 3 (three) times daily as needed for anxiety. 15 tablet 0  . aspirin 81 MG tablet Take 81 mg by mouth daily.     Marland Kitchen  atorvastatin (LIPITOR) 20 MG tablet Take 20 mg by mouth daily.    . cholecalciferol (VITAMIN D) 1000 units tablet Take 1,000 Units by mouth daily.    Marland Kitchen docusate sodium (COLACE) 100 MG capsule Take 1 capsule (100 mg total) by mouth daily. 10 capsule 0  . esomeprazole (NEXIUM) 40 MG capsule Take 40 mg by mouth daily at 12 noon.    . ferrous sulfate 325 (65 FE) MG tablet Take 325 mg by mouth daily with breakfast.    . FLUoxetine (PROZAC) 20 MG capsule Take 20 mg by mouth 2 (two) times daily.    . fluticasone (FLONASE) 50 MCG/ACT nasal spray Place 2 sprays into both nostrils daily.     . Fluticasone-Salmeterol (ADVAIR) 250-50 MCG/DOSE AEPB Inhale 1 puff into the lungs 2 (two) times daily.    . furosemide (LASIX) 20 MG tablet furosemide 20 mg tablet    . hydrALAZINE (APRESOLINE) 25 MG tablet Take 1 tablet (25 mg total) by mouth every 6 (six) hours. (Patient taking differently: Take 25 mg by mouth 2 (two) times daily. )    . HYDROcodone-acetaminophen (NORCO/VICODIN) 5-325 MG tablet Take 1 tablet by mouth as needed.    Marland Kitchen levothyroxine (SYNTHROID, LEVOTHROID) 75 MCG tablet Take 1 tablet (75 mcg total) by mouth daily before breakfast.    . losartan (COZAAR) 100 MG tablet Take 1 tablet (100 mg total) by mouth daily.    . magnesium oxide (MAG-OX) 400 MG tablet Take 400 mg by mouth 2 (two) times daily.    . metoprolol succinate (TOPROL-XL) 100 MG 24 hr tablet Take 1 tablet (100 mg total) by mouth 2 (two) times daily with a meal. Take with or immediately following a meal. (Patient taking differently: Take 50 mg by mouth 2 (two) times daily with a meal. Take with or immediately following a meal.)    . ondansetron (ZOFRAN ODT) 4 MG disintegrating tablet Take 1 tablet (4 mg total) by mouth every 8 (eight) hours as needed for nausea or vomiting. 20 tablet 0  . predniSONE (DELTASONE) 10 MG tablet Take 10 mg by mouth daily with breakfast.     No current facility-administered medications for this visit.      OBJECTIVE: Vitals:   04/08/17 1054  BP: 123/73  Pulse: 63  Resp: 18  Temp: 97.7 F (36.5 C)     Body mass index is 33.69 kg/m.    ECOG FS:0 - Asymptomatic  General: Well-developed, well-nourished, no acute distress. Eyes: Pink conjunctiva, anicteric sclera. Breasts: Patient requested exam be deferred today. Lungs: Clear to auscultation bilaterally. Heart: Regular rate and rhythm. No rubs, murmurs, or gallops. Abdomen: Soft, nontender, nondistended. No organomegaly noted, normoactive bowel sounds. Musculoskeletal: No edema, cyanosis, or clubbing. Neuro: Alert, answering all questions appropriately. Cranial nerves grossly intact. Skin: No rashes or petechiae noted. Psych: Normal affect.    LAB RESULTS:  Lab Results  Component Value Date   NA 138 03/29/2017   K 3.1 (L) 03/29/2017   CL 100 (L) 03/29/2017   CO2 27 03/29/2017  GLUCOSE 139 (H) 03/29/2017   BUN 20 03/29/2017   CREATININE 1.00 03/29/2017   CALCIUM 9.3 03/29/2017   PROT 6.6 03/08/2017   ALBUMIN 3.2 (L) 03/08/2017   AST 25 03/08/2017   ALT 16 03/08/2017   ALKPHOS 54 03/08/2017   BILITOT 0.8 03/08/2017   GFRNONAA 51 (L) 03/29/2017   GFRAA 59 (L) 03/29/2017    Lab Results  Component Value Date   WBC 11.7 (H) 03/29/2017   NEUTROABS 12.5 (H) 12/18/2016   HGB 12.5 03/29/2017   HCT 38.4 03/29/2017   MCV 82.2 03/29/2017   PLT 232 03/29/2017     STUDIES: Dg Chest 1 View  Result Date: 03/30/2017 CLINICAL DATA:  83 year old with right pleural effusion. Status post right thoracentesis. EXAM: CHEST 1 VIEW COMPARISON:  03/30/2015 FINDINGS: Small amount of right pleural fluid has been removed. Negative for pneumothorax following the thoracentesis. Few densities at the right costophrenic angle probably represent atelectasis. Heart size is upper limits of normal and stable. Again noted is a left shoulder arthroplasty. Atherosclerotic calcifications at the aortic arch. IMPRESSION: Slightly improved aeration at  the right lung base. Negative for pneumothorax following the thoracentesis. Electronically Signed   By: Markus Daft M.D.   On: 03/30/2017 16:29   Dg Chest 2 View  Result Date: 03/29/2017 CLINICAL DATA:  Dyspnea with exertion. EXAM: CHEST  2 VIEW COMPARISON:  Radiographs of March 07, 2017. FINDINGS: Stable cardiomediastinal silhouette. Stable mild central pulmonary vascular congestion is noted. Stable bibasilar interstitial densities are noted which may represent scarring or subsegmental atelectasis. No pneumothorax or pleural effusion is noted. Status post left total shoulder arthroplasty. IMPRESSION: Stable cardiomediastinal silhouette with mild central pulmonary vascular congestion. Stable bibasilar subsegmental atelectasis or scarring. Electronically Signed   By: Marijo Conception, M.D.   On: 03/29/2017 15:33   Ct Angio Chest Pe W/cm &/or Wo Cm  Result Date: 03/29/2017 CLINICAL DATA:  Dyspnea with exertion. History of atrial fibrillation and right-sided breast cancer. EXAM: CT ANGIOGRAPHY CHEST WITH CONTRAST TECHNIQUE: Multidetector CT imaging of the chest was performed using the standard protocol during bolus administration of intravenous contrast. Multiplanar CT image reconstructions and MIPs were obtained to evaluate the vascular anatomy. CONTRAST:  75 mL Isovue 370 COMPARISON:  Chest radiograph 03/29/2017 FINDINGS: Cardiovascular: Contrast injection is sufficient to demonstrate satisfactory opacification of the pulmonary arteries to the segmental level. There is no pulmonary embolus. The main pulmonary artery is within normal limits for size. There is no CT evidence of acute right heart strain. The visualized aorta is normal aside from mild atherosclerotic calcification. There is a normal 3-vessel arch branching pattern. Heart size is enlarged. No pericardial effusion. Mediastinum/Nodes: No mediastinal, hilar or axillary lymphadenopathy. The visualized thyroid and thoracic esophageal course are  unremarkable. Lungs/Pleura: There is a large right pleural effusion with associated atelectasis. No nodules or masses. No focal consolidation. Mild pulmonary edema. Upper Abdomen: Contrast bolus timing is not optimized for evaluation of the abdominal organs. Within this limitation, the visualized organs of the upper abdomen are normal. Musculoskeletal: No chest wall abnormality. No acute or significant osseous findings. Review of the MIP images confirms the above findings. IMPRESSION: 1. No pulmonary embolus. 2. Cardiomegaly and mild pulmonary edema. 3. Large right pleural effusion with associated atelectasis. 4.  Aortic Atherosclerosis (ICD10-I70.0). Electronically Signed   By: Ulyses Jarred M.D.   On: 03/29/2017 17:25   US Thoracentesis Asp Pleural Space W/img Guide  Result Date: 03/30/2017 INDICATION: 83 year old with a right pleural effusion and shortness of  breath. EXAM: ULTRASOUND GUIDED RIGHT THORACENTESIS MEDICATIONS: None. COMPLICATIONS: None immediate. PROCEDURE: An ultrasound guided thoracentesis was thoroughly discussed with the patient and questions answered. The benefits, risks, alternatives and complications were also discussed. The patient understands and wishes to proceed with the procedure. Written consent was obtained. Ultrasound was performed to localize and mark an adequate pocket of fluid in the right chest. The area was then prepped and draped in the normal sterile fashion. 1% Lidocaine was used for local anesthesia. Under ultrasound guidance, a 6 Fr Safe-T-Centesis catheter was introduced. Thoracentesis was performed. The catheter was removed and a dressing applied. FINDINGS: A total of approximately 500 mL of yellow fluid was removed. Samples were sent to the laboratory as requested by the clinical team. IMPRESSION: Successful ultrasound guided right thoracentesis yielding 500 mL of pleural fluid. Electronically Signed   By: Markus Daft M.D.   On: 03/30/2017 16:33    ASSESSMENT:  Stage  1b ER/PR positive, HER-2 negative adenocarcinoma of the upper outer quadrant of the right breast.   PLAN:    1. 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  Her most recent on December 06, 2015 was reported as BI-RADS 3. Repeat right breast mammogram in October 2017. Return to clinic in 1 year for routine evaluation.  2. Bone mineral density: Bone mineral density from May 2015 was reported as normal with a T score of -1.0. Now the patient has discontinued her aromatase inhibitor, this can be monitored by her primary care physician.  Approximately 20 minutes was spent in discussion of which greater than 50% was consultation.   Patient expressed understanding and was in agreement with this plan. She also understands that She can call clinic at any time with any questions, concerns, or complaints.    Lloyd Huger, MD   04/10/2017 7:58 AM

## 2017-04-08 ENCOUNTER — Inpatient Hospital Stay: Payer: Medicare Other | Attending: Oncology | Admitting: Oncology

## 2017-04-08 VITALS — BP 123/73 | HR 63 | Temp 97.7°F | Resp 18 | Wt 172.5 lb

## 2017-04-08 DIAGNOSIS — Z79899 Other long term (current) drug therapy: Secondary | ICD-10-CM | POA: Diagnosis not present

## 2017-04-08 DIAGNOSIS — I4891 Unspecified atrial fibrillation: Secondary | ICD-10-CM

## 2017-04-08 DIAGNOSIS — Z880 Allergy status to penicillin: Secondary | ICD-10-CM

## 2017-04-08 DIAGNOSIS — I119 Hypertensive heart disease without heart failure: Secondary | ICD-10-CM

## 2017-04-08 DIAGNOSIS — J90 Pleural effusion, not elsewhere classified: Secondary | ICD-10-CM | POA: Diagnosis not present

## 2017-04-08 DIAGNOSIS — M549 Dorsalgia, unspecified: Secondary | ICD-10-CM

## 2017-04-08 DIAGNOSIS — C50411 Malignant neoplasm of upper-outer quadrant of right female breast: Secondary | ICD-10-CM

## 2017-04-08 DIAGNOSIS — I517 Cardiomegaly: Secondary | ICD-10-CM | POA: Diagnosis not present

## 2017-04-08 DIAGNOSIS — J449 Chronic obstructive pulmonary disease, unspecified: Secondary | ICD-10-CM | POA: Diagnosis not present

## 2017-04-08 DIAGNOSIS — Z9223 Personal history of estrogen therapy: Secondary | ICD-10-CM | POA: Diagnosis not present

## 2017-04-08 DIAGNOSIS — Z170 Estrogen receptor positive status [ER+]: Secondary | ICD-10-CM | POA: Diagnosis not present

## 2017-04-08 DIAGNOSIS — Z7952 Long term (current) use of systemic steroids: Secondary | ICD-10-CM

## 2017-04-08 DIAGNOSIS — Z7982 Long term (current) use of aspirin: Secondary | ICD-10-CM | POA: Diagnosis not present

## 2017-04-08 DIAGNOSIS — Z923 Personal history of irradiation: Secondary | ICD-10-CM

## 2017-04-08 DIAGNOSIS — Z853 Personal history of malignant neoplasm of breast: Secondary | ICD-10-CM

## 2017-04-08 NOTE — Progress Notes (Signed)
Patient is here for a follow up, she is doing ok, she mentions some pain in her lower back.

## 2017-05-15 ENCOUNTER — Inpatient Hospital Stay
Admission: EM | Admit: 2017-05-15 | Discharge: 2017-05-17 | DRG: 186 | Disposition: A | Discharge status: Home / Self Care | Payer: Medicare Other | Attending: Internal Medicine | Admitting: Internal Medicine

## 2017-05-15 ENCOUNTER — Encounter: Payer: Self-pay | Admitting: Emergency Medicine

## 2017-05-15 ENCOUNTER — Emergency Department: Payer: Medicare Other

## 2017-05-15 DIAGNOSIS — I110 Hypertensive heart disease with heart failure: Secondary | ICD-10-CM | POA: Diagnosis present

## 2017-05-15 DIAGNOSIS — Z7982 Long term (current) use of aspirin: Secondary | ICD-10-CM

## 2017-05-15 DIAGNOSIS — Z853 Personal history of malignant neoplasm of breast: Secondary | ICD-10-CM

## 2017-05-15 DIAGNOSIS — I5031 Acute diastolic (congestive) heart failure: Secondary | ICD-10-CM | POA: Diagnosis present

## 2017-05-15 DIAGNOSIS — J90 Pleural effusion, not elsewhere classified: Principal | ICD-10-CM | POA: Diagnosis present

## 2017-05-15 DIAGNOSIS — Z96612 Presence of left artificial shoulder joint: Secondary | ICD-10-CM | POA: Diagnosis present

## 2017-05-15 DIAGNOSIS — Z8249 Family history of ischemic heart disease and other diseases of the circulatory system: Secondary | ICD-10-CM

## 2017-05-15 DIAGNOSIS — Z803 Family history of malignant neoplasm of breast: Secondary | ICD-10-CM

## 2017-05-15 DIAGNOSIS — Z7951 Long term (current) use of inhaled steroids: Secondary | ICD-10-CM

## 2017-05-15 DIAGNOSIS — R0600 Dyspnea, unspecified: Secondary | ICD-10-CM

## 2017-05-15 DIAGNOSIS — Y95 Nosocomial condition: Secondary | ICD-10-CM | POA: Diagnosis present

## 2017-05-15 DIAGNOSIS — R0602 Shortness of breath: Secondary | ICD-10-CM

## 2017-05-15 DIAGNOSIS — J189 Pneumonia, unspecified organism: Secondary | ICD-10-CM | POA: Diagnosis present

## 2017-05-15 DIAGNOSIS — I480 Paroxysmal atrial fibrillation: Secondary | ICD-10-CM

## 2017-05-15 DIAGNOSIS — Z91048 Other nonmedicinal substance allergy status: Secondary | ICD-10-CM

## 2017-05-15 DIAGNOSIS — J449 Chronic obstructive pulmonary disease, unspecified: Secondary | ICD-10-CM | POA: Diagnosis present

## 2017-05-15 DIAGNOSIS — I10 Essential (primary) hypertension: Secondary | ICD-10-CM

## 2017-05-15 DIAGNOSIS — Z9981 Dependence on supplemental oxygen: Secondary | ICD-10-CM

## 2017-05-15 DIAGNOSIS — Z880 Allergy status to penicillin: Secondary | ICD-10-CM

## 2017-05-15 DIAGNOSIS — Z881 Allergy status to other antibiotic agents status: Secondary | ICD-10-CM

## 2017-05-15 DIAGNOSIS — R0902 Hypoxemia: Secondary | ICD-10-CM

## 2017-05-15 DIAGNOSIS — C50919 Malignant neoplasm of unspecified site of unspecified female breast: Secondary | ICD-10-CM

## 2017-05-15 DIAGNOSIS — Z9889 Other specified postprocedural states: Secondary | ICD-10-CM

## 2017-05-15 DIAGNOSIS — E871 Hypo-osmolality and hyponatremia: Secondary | ICD-10-CM | POA: Diagnosis not present

## 2017-05-15 DIAGNOSIS — R531 Weakness: Secondary | ICD-10-CM

## 2017-05-15 DIAGNOSIS — Z888 Allergy status to other drugs, medicaments and biological substances status: Secondary | ICD-10-CM

## 2017-05-15 LAB — BASIC METABOLIC PANEL
ANION GAP: 11 (ref 5–15)
BUN: 17 mg/dL (ref 6–20)
CO2: 27 mmol/L (ref 22–32)
Calcium: 9.6 mg/dL (ref 8.9–10.3)
Chloride: 91 mmol/L — ABNORMAL LOW (ref 101–111)
Creatinine, Ser: 1 mg/dL (ref 0.44–1.00)
GFR calc Af Amer: 59 mL/min — ABNORMAL LOW (ref >60)
GFR calc non Af Amer: 51 mL/min — ABNORMAL LOW (ref >60)
GLUCOSE: 135 mg/dL — AB (ref 65–99)
POTASSIUM: 3.7 mmol/L (ref 3.5–5.1)
Sodium: 129 mmol/L — ABNORMAL LOW (ref 135–145)

## 2017-05-15 LAB — CBC
HEMATOCRIT: 38.2 % (ref 35.0–47.0)
HEMOGLOBIN: 12.9 g/dL (ref 12.0–16.0)
MCH: 29 pg (ref 26.0–34.0)
MCHC: 33.7 g/dL (ref 32.0–36.0)
MCV: 86.2 fL (ref 80.0–100.0)
Platelets: 192 10*3/uL (ref 150–440)
RBC: 4.43 MIL/uL (ref 3.80–5.20)
RDW: 20.1 % — ABNORMAL HIGH (ref 11.5–14.5)
WBC: 9.4 10*3/uL (ref 3.6–11.0)

## 2017-05-15 LAB — TROPONIN I

## 2017-05-15 LAB — BRAIN NATRIURETIC PEPTIDE: B NATRIURETIC PEPTIDE 5: 801 pg/mL — AB (ref 0.0–100.0)

## 2017-05-15 NOTE — ED Triage Notes (Addendum)
Pt to ed with c/o sob, and cough.  Pt states hx of fluid retention and dizziness. Pt able to speak in full sentences without difficulty.  No fluid noted in feet or legs.

## 2017-05-15 NOTE — ED Provider Notes (Signed)
[facility] Emergency Department Provider Note   ____________________________________________   First MD Initiated Contact with Patient 05/15/17 1957     (approximate)  I have reviewed the triage vital signs and the nursing notes.   HISTORY  Chief Complaint Shortness of Breath    HPI Janice Perkins is a 83 y.o. female Patient complains of some nausea today and increasing shortness of breath. She desats to 57 when she ambulates. Chest x-ray shows an effusion on the right side which is larger than she was previously had. She denies any chest tightness or pain. She does say she really doesn't feel good. She has had this effusion drained in the past. When it was drained previously was not as large as this tonight.   Past Medical History:  Diagnosis Date  . Asthma   . Atrial fibrillation ([place])   . Breast cancer ([place]) 2011  . Bronchitis 04/2015  . Cancer [facility]) 2011   breast  . COPD (chronic obstructive pulmonary disease) ([place])   . Hypertension   . Shingles    10/2015    Patient Active Problem List   Diagnosis Date Noted  . Generalized weakness 03/07/2017  . Hyponatremia 03/07/2017  . Dehydration 03/07/2017  . UTI (urinary tract infection) 03/07/2017  . HCAP (healthcare-associated pneumonia) 01/19/2017  . AKI (acute kidney injury) ([place]) 12/18/2016  . Primary cancer of upper outer quadrant of right female breast ([place]) 04/07/2016  . Lumbar radiculopathy 03/23/2016  . Spinal stenosis, lumbar region, with neurogenic claudication 03/23/2016  . DDD (degenerative disc disease), lumbar 01/14/2015  . Lumbosacral facet joint syndrome ([place]) 01/14/2015  . Sacroiliac joint dysfunction 01/14/2015  . Greater trochanteric bursitis 01/14/2015  . Bilateral occipital neuralgia 01/14/2015    Past Surgical History:  Procedure Laterality Date  . BREAST EXCISIONAL BIOPSY Right 11/29/13   two areas  . BREAST EXCISIONAL BIOPSY Right 10/30/2009   lumpectomy - radiation     Prior to Admission medications   Medication Sig Start Date End Date Taking? Authorizing Provider  acetaminophen (TYLENOL) 325 MG tablet Take 2 tablets (650 mg total) by mouth every 6 (six) hours as needed for mild pain (or Fever >/= 101). 12/22/16   Gouru, Illene Silver, MD  albuterol (PROVENTIL HFA;VENTOLIN HFA) 108 (90 Base) MCG/ACT inhaler Inhale 1-2 puffs into the lungs every 6 (six) hours as needed for wheezing or shortness of breath.    [provider]  ALPRAZolam Duanne Moron) 0.25 MG tablet Take 1 tablet (0.25 mg total) by mouth 3 (three) times daily as needed for anxiety. 12/22/16   Nicholes Mango, MD  aspirin 81 MG tablet Take 81 mg by mouth daily.     [provider]  atorvastatin (LIPITOR) 20 MG tablet Take 20 mg by mouth daily.    [provider]  cholecalciferol (VITAMIN D) 1000 units tablet Take 1,000 Units by mouth daily.    [provider]  docusate sodium (COLACE) 100 MG capsule Take 1 capsule (100 mg total) by mouth daily. 12/22/16   Nicholes Mango, MD  esomeprazole (NEXIUM) 40 MG capsule Take 40 mg by mouth daily at 12 noon.    [provider]  ferrous sulfate 325 (65 FE) MG tablet Take 325 mg by mouth daily with breakfast.    [provider]  FLUoxetine (PROZAC) 20 MG capsule Take 20 mg by mouth 2 (two) times daily.    [provider]  fluticasone (FLONASE) 50 MCG/ACT nasal spray Place 2 sprays into both nostrils daily.  [provider]  Fluticasone-Salmeterol (ADVAIR) 250-50 MCG/DOSE AEPB Inhale 1 puff into the lungs 2 (two) times daily.    [provider]  furosemide (LASIX) 20 MG tablet furosemide 20 mg tablet    [provider]  hydrALAZINE (APRESOLINE) 25 MG tablet Take 1 tablet (25 mg total) by mouth every 6 (six) hours. Patient taking differently: Take 25 mg by mouth 2 (two) times daily.  12/22/16   Nicholes Mango, MD  HYDROcodone-acetaminophen (NORCO/VICODIN) 5-325 MG tablet Take 1 tablet by mouth as  needed.    [provider]  levothyroxine (SYNTHROID, LEVOTHROID) 75 MCG tablet Take 1 tablet (75 mcg total) by mouth daily before breakfast. 12/23/16   Gouru, Illene Silver, MD  losartan (COZAAR) 100 MG tablet Take 1 tablet (100 mg total) by mouth daily. 12/23/16   Nicholes Mango, MD  magnesium oxide (MAG-OX) 400 MG tablet Take 400 mg by mouth 2 (two) times daily.    [provider]  metoprolol succinate (TOPROL-XL) 100 MG 24 hr tablet Take 1 tablet (100 mg total) by mouth 2 (two) times daily with a meal. Take with or immediately following a meal. Patient taking differently: Take 50 mg by mouth 2 (two) times daily with a meal. Take with or immediately following a meal. 12/22/16   Gouru, Aruna, MD  ondansetron (ZOFRAN ODT) 4 MG disintegrating tablet Take 1 tablet (4 mg total) by mouth every 8 (eight) hours as needed for nausea or vomiting. 01/21/17   Vaughan Basta, MD  predniSONE (DELTASONE) 10 MG tablet Take 10 mg by mouth daily with breakfast.    [provider]    Allergies Penicillins; Captopril; Enalapril maleate; Seldane  [terfenadine]; Tape; Augmentin [amoxicillin-pot clavulanate]; and Biaxin [clarithromycin]  Family History  Problem Relation Age of Onset  . Hypertension Mother   . Heart disease Mother   . Cancer Mother   . Stroke Father   . Hypertension Father   . Breast cancer Sister     Social History Social History  Substance Use Topics  . Smoking status: Never Smoker  . Smokeless tobacco: Never Used  . Alcohol use No    Review of Systems  Constitutional: No fever/chills Eyes: No visual changes. ENT: No sore throat. Cardiovascular: Denies chest pain. Respiratory:  shortness of breath. Gastrointestinal: No abdominal pain.  No nausea, no vomiting.  No diarrhea.  No constipation. Genitourinary: Negative for dysuria. Musculoskeletal: Negative for back pain. Skin: Negative for rash. Neurological: Negative for headaches, focal weakness    ____________________________________________   PHYSICAL EXAM:  VITAL SIGNS: ED Triage Vitals  Enc Vitals Group     BP 05/15/17 1644 140/76     Pulse Rate 05/15/17 1644 79     Resp 05/15/17 1644 18     Temp 05/15/17 1644 97.6 F (36.4 C)     Temp Source 05/15/17 1644 Oral     SpO2 05/15/17 1644 96 %     Weight 05/15/17 1644 172 lb (78 kg)     Height --      Head Circumference --      Peak Flow --      Pain Score 05/15/17 2114 6     Pain Loc --      Pain Edu? --      Excl. in [place]? --     Constitutional: Alert and oriented. Well appearing and in no acute distress. Eyes: Conjunctivae are normal.  Head: Atraumatic. Nose: No congestion/rhinnorhea. Mouth/Throat: Mucous membranes are moist.  Oropharynx non-erythematous. Neck: No stridor.  Cardiovascular: Normal rate, regular rhythm. Grossly normal heart sounds.  Good peripheral circulation. Respiratory: Normal respiratory effort.  No retractions. Lungs CTAB. Gastrointestinal: Soft and nontender. No distention. No abdominal bruits. No CVA tenderness. Musculoskeletal: No lower extremity tenderness nor edema.  No joint effusions. Neurologic:  Normal speech and language. No gross focal neurologic deficits are appreciated. No gait instability. Skin:  Skin is warm, dry and intact. No rash noted. Psychiatric: Mood and affect are normal. Speech and behavior are normal.  ____________________________________________   LABS (all labs ordered are listed, but only abnormal results are displayed)  Labs Reviewed  BASIC METABOLIC PANEL - Abnormal; Notable for the following:       Result Value   Sodium 129 (*)    Chloride 91 (*)    Glucose, Bld 135 (*)    GFR calc non Af Amer 51 (*)    GFR calc Af Amer 59 (*)    All other components within normal limits  CBC - Abnormal; Notable for the following:    RDW 20.1 (*)    All other components within normal limits  BRAIN NATRIURETIC PEPTIDE - Abnormal; Notable for the following:    B  Natriuretic Peptide 801.0 (*)    All other components within normal limits  TROPONIN I   ____________________________________________  EKG  b                 EKG read and interpreted by me shows A. fib at a rate of 81  normal axis no acute ST-T changes                                                                                                                                                                                   ____________________________________________  RADIOLOGY  Dg Chest 2 View  Result Date: 05/15/2017 CLINICAL DATA:  Pt to ed with c/o sob, and cough. Pt states hx of fluid retention and dizziness. Hx/o A-fib, breast cancer, bronchitis, COPD, HTN, and shingles EXAM: CHEST  2 VIEW COMPARISON:  03/30/2017 FINDINGS: Heart is enlarged. There is a right pleural effusion associated right basilar atelectasis or infiltrate which obscures the hemidiaphragm. There is mild perihilar peribronchial thickening. No pulmonary edema. Left shoulder arthroplasty. IMPRESSION: 1. Cardiomegaly without pulmonary edema. 2. Right pleural effusion and right lower lobe atelectasis or infiltrate. Electronically Signed   By: Nolon Nations M.D.   On: 05/15/2017 18:09   chest x-ray shows ____________________________________________   PROCEDURES  Procedure(s) performed: she was ambulated and desatted to 88%  Procedures  Critical Care performed:   ____________________________________________   INITIAL IMPRESSION / ASSESSMENT AND PLAN / ED COURSE  Pertinent labs & imaging results that were  available during my care of the patient were reviewed by me and considered in my medical decision making (see chart for details).    Clinical Course as of May 16 2147  Sat May 15, 2017  1957 Glucose: Marland Kitchen 135 [PM]    Clinical Course User Index [PM] Nena Polio, MD   since patient desaturates and has a large right pleural effusion and usually and  hyponatremia  ____________________________________________   FINAL CLINICAL IMPRESSION(S) / ED DIAGNOSES  Final diagnoses:  Hypoxia  Dyspnea, unspecified type  Pleural effusion  Hyponatremia      NEW MEDICATIONS STARTED DURING THIS VISIT:  New Prescriptions   No medications on file     Note:  This document was prepared using Dragon voice recognition software and may include unintentional dictation errors.    Nena Polio, MD 05/15/17 2149

## 2017-05-15 NOTE — ED Notes (Signed)
Per Dr. Cinda Quest order to walk pt and observe oxygen saturation. Pt oxygen saturation prior to walk 93% RA pulse 80. Pt tolerated walking oxygen saturation dropped to 88% pt denied any shortness of breath, pulse increased to 90bpm

## 2017-05-15 NOTE — ED Notes (Signed)
Pt reports developed shortness of breath about 2 days ago, reports increases with activities, pt reports she got some fluid removed from her right lung about 2 months ago, reports she has COPD uses inhalers as recommended, reports has chronic back pain. Pt talks in complete sentences denies any chest pain

## 2017-05-16 DIAGNOSIS — Z9981 Dependence on supplemental oxygen: Secondary | ICD-10-CM | POA: Diagnosis not present

## 2017-05-16 DIAGNOSIS — Y95 Nosocomial condition: Secondary | ICD-10-CM | POA: Diagnosis present

## 2017-05-16 DIAGNOSIS — Z880 Allergy status to penicillin: Secondary | ICD-10-CM | POA: Diagnosis not present

## 2017-05-16 DIAGNOSIS — Z96612 Presence of left artificial shoulder joint: Secondary | ICD-10-CM | POA: Diagnosis present

## 2017-05-16 DIAGNOSIS — I110 Hypertensive heart disease with heart failure: Secondary | ICD-10-CM | POA: Diagnosis present

## 2017-05-16 DIAGNOSIS — J90 Pleural effusion, not elsewhere classified: Secondary | ICD-10-CM | POA: Diagnosis present

## 2017-05-16 DIAGNOSIS — Z853 Personal history of malignant neoplasm of breast: Secondary | ICD-10-CM | POA: Diagnosis not present

## 2017-05-16 DIAGNOSIS — I10 Essential (primary) hypertension: Secondary | ICD-10-CM

## 2017-05-16 DIAGNOSIS — Z803 Family history of malignant neoplasm of breast: Secondary | ICD-10-CM | POA: Diagnosis not present

## 2017-05-16 DIAGNOSIS — I5031 Acute diastolic (congestive) heart failure: Secondary | ICD-10-CM | POA: Diagnosis present

## 2017-05-16 DIAGNOSIS — E871 Hypo-osmolality and hyponatremia: Secondary | ICD-10-CM | POA: Diagnosis present

## 2017-05-16 DIAGNOSIS — Z888 Allergy status to other drugs, medicaments and biological substances status: Secondary | ICD-10-CM | POA: Diagnosis not present

## 2017-05-16 DIAGNOSIS — Z7951 Long term (current) use of inhaled steroids: Secondary | ICD-10-CM | POA: Diagnosis not present

## 2017-05-16 DIAGNOSIS — I480 Paroxysmal atrial fibrillation: Secondary | ICD-10-CM | POA: Diagnosis present

## 2017-05-16 DIAGNOSIS — Z7982 Long term (current) use of aspirin: Secondary | ICD-10-CM | POA: Diagnosis not present

## 2017-05-16 DIAGNOSIS — Z8249 Family history of ischemic heart disease and other diseases of the circulatory system: Secondary | ICD-10-CM | POA: Diagnosis not present

## 2017-05-16 DIAGNOSIS — J449 Chronic obstructive pulmonary disease, unspecified: Secondary | ICD-10-CM | POA: Diagnosis present

## 2017-05-16 DIAGNOSIS — Z91048 Other nonmedicinal substance allergy status: Secondary | ICD-10-CM | POA: Diagnosis not present

## 2017-05-16 DIAGNOSIS — Z881 Allergy status to other antibiotic agents status: Secondary | ICD-10-CM | POA: Diagnosis not present

## 2017-05-16 DIAGNOSIS — C50919 Malignant neoplasm of unspecified site of unspecified female breast: Secondary | ICD-10-CM

## 2017-05-16 LAB — CBC
HEMATOCRIT: 39.6 % (ref 35.0–47.0)
Hemoglobin: 13.4 g/dL (ref 12.0–16.0)
MCH: 29.3 pg (ref 26.0–34.0)
MCHC: 33.9 g/dL (ref 32.0–36.0)
MCV: 86.5 fL (ref 80.0–100.0)
PLATELETS: 187 10*3/uL (ref 150–440)
RBC: 4.58 MIL/uL (ref 3.80–5.20)
RDW: 19.4 % — AB (ref 11.5–14.5)
WBC: 8.2 10*3/uL (ref 3.6–11.0)

## 2017-05-16 LAB — OSMOLALITY: Osmolality: 271 mOsm/kg — ABNORMAL LOW (ref 275–295)

## 2017-05-16 LAB — BASIC METABOLIC PANEL
Anion gap: 11 (ref 5–15)
BUN: 14 mg/dL (ref 6–20)
CHLORIDE: 90 mmol/L — AB (ref 101–111)
CO2: 29 mmol/L (ref 22–32)
CREATININE: 0.93 mg/dL (ref 0.44–1.00)
Calcium: 9.5 mg/dL (ref 8.9–10.3)
GFR calc Af Amer: >60 mL/min (ref >60)
GFR calc non Af Amer: 56 mL/min — ABNORMAL LOW (ref >60)
GLUCOSE: 126 mg/dL — AB (ref 65–99)
POTASSIUM: 3.5 mmol/L (ref 3.5–5.1)
SODIUM: 130 mmol/L — AB (ref 135–145)

## 2017-05-16 LAB — PROTEIN, TOTAL: Total Protein: 7.4 g/dL (ref 6.5–8.1)

## 2017-05-16 LAB — TSH: TSH: 2.817 u[IU]/mL (ref 0.350–4.500)

## 2017-05-16 LAB — LACTATE DEHYDROGENASE: LDH: 245 U/L — ABNORMAL HIGH (ref 98–192)

## 2017-05-16 LAB — PROCALCITONIN: Procalcitonin: <0.1 ng/mL

## 2017-05-16 MED ORDER — VANCOMYCIN HCL 10 G IV SOLR
1250.0000 mg | INTRAVENOUS | Status: DC
Start: 2017-05-16 — End: 2017-05-16
  Filled 2017-05-16: qty 1250

## 2017-05-16 MED ORDER — HYDRALAZINE HCL 25 MG PO TABS
25.0000 mg | ORAL_TABLET | Freq: Four times a day (QID) | ORAL | Status: DC
  Administered 2017-05-16 – 2017-05-17 (×6): 25 mg via ORAL
  Filled 2017-05-16 (×6): qty 1

## 2017-05-16 MED ORDER — LEVOTHYROXINE SODIUM 50 MCG PO TABS
75.0000 ug | ORAL_TABLET | Freq: Every day | ORAL | Status: DC
  Administered 2017-05-16 – 2017-05-17 (×2): 75 ug via ORAL
  Filled 2017-05-16 (×2): qty 1

## 2017-05-16 MED ORDER — METOPROLOL SUCCINATE ER 50 MG PO TB24
50.0000 mg | ORAL_TABLET | Freq: Two times a day (BID) | ORAL | Status: DC
  Administered 2017-05-16 – 2017-05-17 (×3): 50 mg via ORAL
  Filled 2017-05-16 (×3): qty 1

## 2017-05-16 MED ORDER — LEVOFLOXACIN 500 MG PO TABS: 250.0000 mg | ORAL_TABLET | Freq: Every day | ORAL | Status: DC

## 2017-05-16 MED ORDER — IPRATROPIUM-ALBUTEROL 0.5-2.5 (3) MG/3ML IN SOLN: 3.0000 mL | RESPIRATORY_TRACT | Status: DC | PRN

## 2017-05-16 MED ORDER — ALPRAZOLAM 0.25 MG PO TABS
0.2500 mg | ORAL_TABLET | Freq: Three times a day (TID) | ORAL | Status: DC | PRN
  Administered 2017-05-16 (×3): 0.25 mg via ORAL
  Filled 2017-05-16 (×3): qty 1

## 2017-05-16 MED ORDER — TORSEMIDE 5 MG PO TABS
5.0000 mg | ORAL_TABLET | Freq: Every day | ORAL | Status: DC
  Administered 2017-05-17: 5 mg via ORAL
  Filled 2017-05-16: qty 1

## 2017-05-16 MED ORDER — LEVOFLOXACIN 500 MG PO TABS
500.0000 mg | ORAL_TABLET | Freq: Once | ORAL | Status: AC
  Administered 2017-05-16: 500 mg via ORAL
  Filled 2017-05-16: qty 1

## 2017-05-16 MED ORDER — ATORVASTATIN CALCIUM 20 MG PO TABS
20.0000 mg | ORAL_TABLET | Freq: Every day | ORAL | Status: DC
  Administered 2017-05-16: 20 mg via ORAL
  Filled 2017-05-16: qty 1

## 2017-05-16 MED ORDER — ONDANSETRON HCL 4 MG/2ML IJ SOLN: 4.0000 mg | Freq: Four times a day (QID) | INTRAMUSCULAR | Status: DC | PRN

## 2017-05-16 MED ORDER — AZTREONAM 1 G IJ SOLR
1.0000 g | Freq: Three times a day (TID) | INTRAMUSCULAR | Status: DC
  Administered 2017-05-16: 1 g via INTRAVENOUS
  Filled 2017-05-16 (×3): qty 1

## 2017-05-16 MED ORDER — METHYLPREDNISOLONE SODIUM SUCC 40 MG IJ SOLR
40.0000 mg | Freq: Two times a day (BID) | INTRAMUSCULAR | Status: DC
  Administered 2017-05-16 – 2017-05-17 (×3): 40 mg via INTRAVENOUS
  Filled 2017-05-16 (×3): qty 1

## 2017-05-16 MED ORDER — TORSEMIDE 5 MG PO TABS
5.0000 mg | ORAL_TABLET | Freq: Every day | ORAL | Status: DC
  Filled 2017-05-16 (×2): qty 1

## 2017-05-16 MED ORDER — ONDANSETRON HCL 4 MG PO TABS: 4.0000 mg | ORAL_TABLET | Freq: Four times a day (QID) | ORAL | Status: DC | PRN

## 2017-05-16 MED ORDER — FUROSEMIDE 10 MG/ML IJ SOLN
60.0000 mg | Freq: Once | INTRAMUSCULAR | Status: AC
  Administered 2017-05-16: 60 mg via INTRAVENOUS
  Filled 2017-05-16: qty 8

## 2017-05-16 MED ORDER — ENOXAPARIN SODIUM 40 MG/0.4ML ~~LOC~~ SOLN
40.0000 mg | SUBCUTANEOUS | Status: DC
  Administered 2017-05-16 – 2017-05-17 (×2): 40 mg via SUBCUTANEOUS
  Filled 2017-05-16 (×2): qty 0.4

## 2017-05-16 MED ORDER — PANTOPRAZOLE SODIUM 40 MG PO TBEC
40.0000 mg | DELAYED_RELEASE_TABLET | Freq: Every day | ORAL | Status: DC
  Administered 2017-05-16 – 2017-05-17 (×2): 40 mg via ORAL
  Filled 2017-05-16 (×2): qty 1

## 2017-05-16 MED ORDER — LOSARTAN POTASSIUM 50 MG PO TABS
100.0000 mg | ORAL_TABLET | Freq: Every day | ORAL | Status: DC
  Administered 2017-05-16 – 2017-05-17 (×2): 100 mg via ORAL
  Filled 2017-05-16 (×2): qty 2

## 2017-05-16 MED ORDER — MAGNESIUM OXIDE 400 (241.3 MG) MG PO TABS
400.0000 mg | ORAL_TABLET | Freq: Two times a day (BID) | ORAL | Status: DC
  Administered 2017-05-16 – 2017-05-17 (×4): 400 mg via ORAL
  Filled 2017-05-16 (×4): qty 1

## 2017-05-16 MED ORDER — ACETAMINOPHEN 325 MG PO TABS
650.0000 mg | ORAL_TABLET | Freq: Four times a day (QID) | ORAL | Status: DC | PRN
  Filled 2017-05-16: qty 2

## 2017-05-16 MED ORDER — POLYETHYLENE GLYCOL 3350 17 G PO PACK: 17.0000 g | PACK | Freq: Every day | ORAL | Status: DC | PRN

## 2017-05-16 MED ORDER — ACETAMINOPHEN 650 MG RE SUPP: 650.0000 mg | Freq: Four times a day (QID) | RECTAL | Status: DC | PRN

## 2017-05-16 MED ORDER — FLUOXETINE HCL 20 MG PO CAPS
20.0000 mg | ORAL_CAPSULE | Freq: Two times a day (BID) | ORAL | Status: DC
  Administered 2017-05-16 – 2017-05-17 (×3): 20 mg via ORAL
  Filled 2017-05-16 (×4): qty 1

## 2017-05-16 MED ORDER — VANCOMYCIN HCL IN DEXTROSE 1-5 GM/200ML-% IV SOLN
1000.0000 mg | Freq: Once | INTRAVENOUS | Status: AC
  Administered 2017-05-16: 1000 mg via INTRAVENOUS
  Filled 2017-05-16: qty 200

## 2017-05-16 MED ORDER — SENNA 8.6 MG PO TABS: 1.0000 | ORAL_TABLET | Freq: Every day | ORAL | Status: DC | PRN

## 2017-05-16 MED ORDER — POTASSIUM CHLORIDE CRYS ER 20 MEQ PO TBCR
40.0000 meq | EXTENDED_RELEASE_TABLET | Freq: Once | ORAL | Status: AC
  Administered 2017-05-16: 40 meq via ORAL
  Filled 2017-05-16: qty 2

## 2017-05-16 MED ORDER — HYDROCODONE-ACETAMINOPHEN 5-325 MG PO TABS
1.0000 | ORAL_TABLET | Freq: Four times a day (QID) | ORAL | Status: DC | PRN
  Administered 2017-05-16: 1 via ORAL
  Filled 2017-05-16 (×2): qty 1

## 2017-05-16 MED ORDER — DICYCLOMINE HCL 10 MG PO CAPS
10.0000 mg | ORAL_CAPSULE | Freq: Two times a day (BID) | ORAL | Status: DC
  Filled 2017-05-16: qty 1

## 2017-05-16 NOTE — ED Notes (Signed)
Margarita Grizzle, RN aware of bed assigned

## 2017-05-16 NOTE — H&P (Signed)
PCP:   [name], MD   Chief Complaint:  Not feeling right  HPI: This is a 83 year old female who states that for the past week she is just not felt right. She reports nausea and being of breath easily. She reports pain in her left upper and lower quadrants. She reports being lethargic. She's had a nonproductive cough for the last week and wheezing last 5 days. She's been dizzy and lightheaded. She does have lots of sinus issues. She saw her PCP who is treating her for UTI with Cipro. He thought she could have a diverticulitis and ordered Flagyl which has not been initiated. She came to the ER because she is simply not feeling well. History provided by the patient as well as her husband is present at bedside. She has had recurrent pneumonia and thoracentesis twice.  Review of Systems:  The patient denies anorexia, fever, weight loss,, vision loss, decreased hearing, weakness, hoarseness, chest pain, syncope, dyspnea on exertion, peripheral edema, balance deficits, hemoptysis, abdominal pain, melena, hematochezia, severe indigestion/heartburn, hematuria, incontinence, genital sores, muscle weakness, suspicious skin lesions, transient blindness, difficulty walking, depression, unusual weight change, abnormal bleeding, enlarged lymph nodes, angioedema, and breast masses.  Past Medical History: Past Medical History:  Diagnosis Date  . Asthma   . Atrial fibrillation ([place])   . Breast cancer ([place]) 2011  . Bronchitis 04/2015  . Cancer [facility]) 2011   breast  . COPD (chronic obstructive pulmonary disease) ([place])   . Hypertension   . Shingles    10/2015   Past Surgical History:  Procedure Laterality Date  . BREAST EXCISIONAL BIOPSY Right 11/29/13   two areas  . BREAST EXCISIONAL BIOPSY Right 10/30/2009   lumpectomy - radiation    Medications: Prior to Admission medications   Medication Sig Start Date End Date Taking? Authorizing Provider  acetaminophen (TYLENOL) 325 MG tablet Take 2  tablets (650 mg total) by mouth every 6 (six) hours as needed for mild pain (or Fever >/= 101). 12/22/16  Yes Gouru, Illene Silver, MD  albuterol (PROVENTIL HFA;VENTOLIN HFA) 108 (90 Base) MCG/ACT inhaler Inhale 1-2 puffs into the lungs every 6 (six) hours as needed for wheezing or shortness of breath.   Yes [provider]  ALPRAZolam (XANAX) 0.25 MG tablet Take 1 tablet (0.25 mg total) by mouth 3 (three) times daily as needed for anxiety. 12/22/16  Yes Gouru, Illene Silver, MD  aspirin 81 MG tablet Take 81 mg by mouth daily.    Yes [provider]  atorvastatin (LIPITOR) 20 MG tablet Take 20 mg by mouth daily.   Yes [provider]  cholecalciferol (VITAMIN D) 1000 units tablet Take 1,000 Units by mouth daily.   Yes [provider]  ciprofloxacin (CIPRO) 500 MG tablet Take 500 mg by mouth 2 (two) times daily. 05/07/17 05/17/17 Yes [provider]  esomeprazole (NEXIUM) 40 MG capsule Take 40 mg by mouth daily at 12 noon.   Yes [provider]  ferrous sulfate 325 (65 FE) MG tablet Take 325 mg by mouth daily with breakfast.   Yes [provider]  FLUoxetine (PROZAC) 20 MG capsule Take 20 mg by mouth 2 (two) times daily.   Yes [provider]  fluticasone (FLONASE) 50 MCG/ACT nasal spray Place 2 sprays into both nostrils daily.    Yes [provider]  Fluticasone-Salmeterol (ADVAIR) 250-50 MCG/DOSE AEPB Inhale 1 puff into the lungs 2 (two) times daily.   Yes [provider]  HYDROcodone-acetaminophen (NORCO/VICODIN) 5-325 MG tablet Take 1  tablet by mouth as needed.   Yes [provider]  levothyroxine (SYNTHROID, LEVOTHROID) 75 MCG tablet Take 1 tablet (75 mcg total) by mouth daily before breakfast. 12/23/16  Yes Gouru, Aruna, MD  losartan (COZAAR) 100 MG tablet Take 1 tablet (100 mg total) by mouth daily. 12/23/16  Yes Gouru, Illene Silver, MD  magnesium oxide (MAG-OX) 400 MG tablet Take 400 mg by mouth 2 (two) times daily.   Yes  [provider]  metoprolol succinate (TOPROL-XL) 100 MG 24 hr tablet Take 1 tablet (100 mg total) by mouth 2 (two) times daily with a meal. Take with or immediately following a meal. Patient taking differently: Take 50 mg by mouth 2 (two) times daily with a meal. Take with or immediately following a meal. 12/22/16  Yes Gouru, Aruna, MD  ondansetron (ZOFRAN ODT) 4 MG disintegrating tablet Take 1 tablet (4 mg total) by mouth every 8 (eight) hours as needed for nausea or vomiting. 01/21/17  Yes Vaughan Basta, MD  senna (SENOKOT) 8.6 MG TABS tablet Take 1 tablet by mouth daily as needed for mild constipation.   Yes [provider]  torsemide (DEMADEX) 5 MG tablet Take 5 mg by mouth daily.   Yes [provider]  dicyclomine (BENTYL) 10 MG capsule Take 10 mg by mouth 2 (two) times daily. 05/07/17 05/07/18  [provider]  docusate sodium (COLACE) 100 MG capsule Take 1 capsule (100 mg total) by mouth daily. Patient not taking: Reported on 05/15/2017 12/22/16   Nicholes Mango, MD  furosemide (LASIX) 20 MG tablet furosemide 20 mg tablet    [provider]  hydrALAZINE (APRESOLINE) 25 MG tablet Take 1 tablet (25 mg total) by mouth every 6 (six) hours. Patient not taking: Reported on 05/15/2017 12/22/16   Nicholes Mango, MD  metroNIDAZOLE (FLAGYL) 500 MG tablet Take 500 mg by mouth 2 (two) times daily. 05/07/17 05/17/17  [provider]  predniSONE (DELTASONE) 10 MG tablet Take 10 mg by mouth daily with breakfast.    [provider]    Allergies:   Allergies  Allergen Reactions  . Penicillins Hives    .Has patient had a PCN reaction causing immediate rash, facial/tongue/throat swelling, SOB or lightheadedness with hypotension: Unknown Has patient had a PCN reaction causing severe rash involving mucus membranes or skin necrosis: Unknown Has patient had a PCN reaction that required hospitalization: Unknown Has patient had a PCN reaction occurring  within the last 10 years: Unknown If all of the above answers are "NO", then may proceed with Cephalosporin use.   . Captopril Other (See Comments)  . Enalapril Maleate     Other reaction(s): Unknown  . Seldane  [Terfenadine] Other (See Comments)  . Tape Itching  . Augmentin [Amoxicillin-Pot Clavulanate] Rash  . Biaxin [Clarithromycin] Rash, Other (See Comments) and Hives    Other reaction(s): Unknown    Social History:  reports that she has never smoked. She has never used smokeless tobacco. She reports that she does not drink alcohol or use drugs.  Family History: Family History  Problem Relation Age of Onset  . Hypertension Mother   . Heart disease Mother   . Cancer Mother   . Stroke Father   . Hypertension Father   . Breast cancer Sister     Physical Exam: Vitals:   05/15/17 2114 05/15/17 2230 05/15/17 2330 05/16/17 0000  BP: (!) 153/84 (!) 156/82 (!) 158/89 (!) 148/74  Pulse: 84 71 78 73  Resp: 19 16 16 18   Temp:  87 F (36.7 C)     TempSrc: Oral     SpO2: 94% 94% 94% 93%  Weight:        General:  Alert and oriented times three, well developed and nourished, no acute distress Eyes: PERRLA, pink conjunctiva, no scleral icterus ENT: Moist oral mucosa, neck supple, no thyromegaly Lungs: clear to ascultation, no wheeze, no crackles, no use of accessory muscles Cardiovascular: regular rate and rhythm, no regurgitation, no gallops, no murmurs. No carotid bruits, no JVD Abdomen: soft, positive BS, No specific tenderness to palpation greatest on the left, non-distended, no organomegaly, not an acute abdomen GU: not examined Neuro: CN II - XII grossly intact, sensation intact Musculoskeletal: strength 5/5 all extremities, no clubbing, cyanosis or edema Skin: no rash, no subcutaneous crepitation, no decubitus Psych: appropriate patient   Labs on Admission:   Recent Labs  05/15/17 1656  NA 129*  K 3.7  CL 91*  CO2 27  GLUCOSE 135*  BUN 17  CREATININE 1.00   CALCIUM 9.6   No results for input(s): AST, ALT, ALKPHOS, BILITOT, PROT, ALBUMIN in the last 72 hours. No results for input(s): LIPASE, AMYLASE in the last 72 hours.  Recent Labs  05/15/17 1656  WBC 9.4  HGB 12.9  HCT 38.2  MCV 86.2  PLT 192    Recent Labs  05/15/17 1656  TROPONINI <0.03   Invalid input(s): POCBNP No results for input(s): DDIMER in the last 72 hours. No results for input(s): HGBA1C in the last 72 hours. No results for input(s): CHOL, HDL, LDLCALC, TRIG, CHOLHDL, LDLDIRECT in the last 72 hours. No results for input(s): TSH, T4TOTAL, T3FREE, THYROIDAB in the last 72 hours.  Invalid input(s): FREET3 No results for input(s): VITAMINB12, FOLATE, FERRITIN, TIBC, IRON, RETICCTPCT in the last 72 hours.  Micro Results: No results found for this or any previous visit (from the past 240 hour(s)).   Radiological Exams on Admission: Dg Chest 2 View  Result Date: 05/15/2017 CLINICAL DATA:  Pt to ed with c/o sob, and cough. Pt states hx of fluid retention and dizziness. Hx/o A-fib, breast cancer, bronchitis, COPD, HTN, and shingles EXAM: CHEST  2 VIEW COMPARISON:  03/30/2017 FINDINGS: Heart is enlarged. There is a right pleural effusion associated right basilar atelectasis or infiltrate which obscures the hemidiaphragm. There is mild perihilar peribronchial thickening. No pulmonary edema. Left shoulder arthroplasty. IMPRESSION: 1. Cardiomegaly without pulmonary edema. 2. Right pleural effusion and right lower lobe atelectasis or infiltrate. Electronically Signed   By: Nolon Nations M.D.   On: 05/15/2017 18:09    Assessment/Plan Present on Admission: . Pleural effusion recurrent/shortness of breath  -Admit to MedSurg -blood cultures 2 -Consult interventional radiologist for thoracentesis, sent off for cytology and absent  . Hyponatremia -Likely due to pulmonary issues. Lasix tablets held -TSH, urine sodium, urine and plasma osmolality ordered  -Gentle IV fluid  rehydration -Repeat BMP in am  . HCAP (healthcare-associated pneumonia) -Given patient's recurrent pleural effusion, but she patient for underlying pneumonia with azithromycin and vancomycin -Given patient's history of breast cancer tumor markers have been sent off including Ca15-3 and ca 27-29. Plus other routine tumor markers   History of breast cancer -See above  COPD Dependence on nocturnal oxygen -Doing abs, though the Solu-Medrol, oxygen  Hypertension -Stable, home medications resumed  Daquann Merriott 05/16/2017, 12:22 AM

## 2017-05-16 NOTE — ED Notes (Signed)
Pt transported to room 141

## 2017-05-16 NOTE — Progress Notes (Addendum)
Date: 05/16/2017,   MRN# [id number] Janice Perkins 12-11-1932 Code Status:     Code Status Orders        Start     Ordered   05/16/17 0042  Full code  Continuous     05/16/17 0041    Code Status History    Date Active Date Inactive Code Status Order ID Comments User Context   03/07/2017  2:46 PM 03/09/2017  3:09 PM Full Code [id number]  Idelle Crouch, MD Inpatient   01/19/2017  8:37 AM 01/21/2017  2:15 PM Full Code [id number]  Harrie Foreman, MD Inpatient   12/18/2016  7:18 AM 12/22/2016  7:47 PM Full Code [id number]  Harrie Foreman, MD Inpatient    Advance Directive Documentation     Most Recent Value  Type of Advance Directive  Healthcare Power of Attorney  Pre-existing out of facility DNR order (yellow form or pink MOST form)  -  "MOST" Form in Place?  -     Hosp day:@LENGTHOFSTAYDAYS @ Referring MD: @ATDPROV @      CC: persistent right pleural effusion  HPI: this is an 83 yr old lady, phx of right breast cancer who came in with lightheadedness, nausea, " not feeling good. " on w/u noted to have a persistent vs recurrent right effusion. Status post thoracentesis 8/18. Data shown below support a transudate.  Total protein, fluid g/dL <3.0   Comment: (NOTE)    LD, Fluid 3 - 23 U/L 83     Glucose, Fluid mg/dL 108    No growth, cytology - ve On review there is a small right pleural effusion. ECHO: Feb 2018: EF: 50% with mild MR and TR      PMHX:   Past Medical History:  Diagnosis Date  . Asthma   . Atrial fibrillation ([place])   . Breast cancer ([place]) 2011  . Bronchitis 04/2015  . Cancer [facility]) 2011   breast  . COPD (chronic obstructive pulmonary disease) ([place])   . Hypertension   . Shingles    10/2015   Surgical Hx:  Past Surgical History:  Procedure Laterality Date  . BREAST EXCISIONAL BIOPSY Right 11/29/13   two areas  . BREAST EXCISIONAL BIOPSY Right 10/30/2009   lumpectomy - radiation   Family Hx:  Family History  Problem Relation Age of Onset  .  Hypertension Mother   . Heart disease Mother   . Cancer Mother   . Stroke Father   . Hypertension Father   . Breast cancer Sister    Social Hx:   Social History  Substance Use Topics  . Smoking status: Never Smoker  . Smokeless tobacco: Never Used  . Alcohol use No   Medication:    Home Medication:    Current Medication: @CURMEDTAB @   Allergies:  Penicillins; Captopril; Enalapril maleate; Seldane  [terfenadine]; Tape; Augmentin [amoxicillin-pot clavulanate]; and Biaxin [clarithromycin]  Review of Systems: Gen:  Denies  fever, sweats, chills HEENT: Denies blurred vision, double vision, ear pain, eye pain, hearing loss, nose bleeds, sore throat Cvc:  No dizziness, chest pain or heaviness Resp:    Gi: Denies swallowing difficulty, stomach pain, nausea or vomiting, diarrhea, constipation, bowel incontinence Gu:  Denies bladder incontinence, burning urine Ext:   No Joint pain, stiffness or swelling Skin: No skin rash, easy bruising or bleeding or hives Endoc:  No polyuria, polydipsia , polyphagia or weight change Psych: No depression, insomnia or hallucinations  Other:  All other systems negative  Physical Examination:   VS:  BP (!) 155/80   Pulse 83   Temp 98.5 F (36.9 C) (Axillary)   Resp 20   Wt 173 lb 4.8 oz (78.6 kg)   SpO2 90%   BMI 33.85 kg/m   General Appearance: No distress  Neuro: without focal findings, mental status, speech normal, alert and oriented, cranial nerves 2-12 intact, reflexes normal and symmetric, sensation grossly normal  HEENT: PERRLA, EOM intact, no ptosis, no other lesions noticed, Mallampati: Pulmonary:.No wheezing, No rales  Sputum Production:   Cardiovascular:  Normal S1,S2.  No m/r/g.  Abdominal aorta pulsation normal.    Abdomen:Benign, Soft, non-tender, No masses, hepatosplenomegaly, No lymphadenopathy Endoc: No evident thyromegaly, no signs of acromegaly or Cushing features Skin:   warm, no rashes, no ecchymosis  Extremities:  normal, no cyanosis, clubbing, no edema, warm with normal capillary refill. Other findings:   Labs results:   Recent Labs     05/15/17  1656  05/16/17  0120  HGB  12.9  13.4  HCT  38.2  39.6  MCV  86.2  86.5  WBC  9.4  8.2  BUN  17  14  CREATININE  1.00  0.93  GLUCOSE  135*  126*  CALCIUM  9.6  9.5  ,    No results for input(s): PH in the last 72 hours.  Invalid input(s): PCO2, PO2, BASEEXCESS, BASEDEFICITE, TFT  Culture results:      SPECIMEN SUBMITTED:  A. 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 PLEURAL FLUID, RIGHT; THORACENTESIS:  - NEGATIVE FOR MALIGNANCY, SEE COMMENT  - REACTIVE MESOTHELIAL CELLS AND MIXED INFLAMMATION. Dg Chest 2 View  Result Date: 05/15/2017 CLINICAL DATA:  Pt to ed with c/o sob, and cough. Pt states hx of fluid retention and dizziness. Hx/o A-fib, breast cancer, bronchitis, COPD, HTN, and shingles EXAM: CHEST  2 VIEW COMPARISON:  03/30/2017 FINDINGS: Heart is enlarged. There is a right pleural effusion associated right basilar atelectasis or infiltrate which obscures the hemidiaphragm. There is mild perihilar peribronchial thickening. No pulmonary edema. Left shoulder arthroplasty. IMPRESSION: 1. Cardiomegaly without pulmonary edema. 2. Right pleural effusion and right lower lobe atelectasis or infiltrate. Electronically Signed   By: Nolon Nations M.D.   On: 05/15/2017 18:09    Assessment and Plan: The pleural effusion thus far is a transudate, repeat thoracentesis planned if there is enough fluid there. Not clear there is a an associated pneumonia or just atelectasis.  -agree with repeat thoracentesis -incentive spirometry -echo -further orders per above   I have  personally obtained a history, examined the patient, evaluated laboratory and imaging results, formulated the assessment and plan and placed orders.  The Patient requires high complexity decision making for assessment and support, frequent evaluation and titration of therapies, application of advanced monitoring technologies and extensive interpretation of multiple databases.   Henrine Hayter,M.D. Pulmonary & Critical care Medicine [facility]

## 2017-05-16 NOTE — Progress Notes (Signed)
  Pharmacy Antibiotic Note  Janice Perkins is a 83 y.o. female admitted on 05/15/2017 with COPD and CAP .  Pharmacy has been consulted for levofloxacin dosing.  Plan: Levofloxacin 500 mg PO once followed by levofloxacin 250 mg PO daily for total duration 7 days  CrCl: 35 (IBW=47.8kg)   Weight: 173 lb 4.8 oz (78.6 kg)  Temp (24hrs), Avg:98 F (36.7 C), Min:97.6 F (36.4 C), Max:98.5 F (36.9 C)   Recent Labs Lab 05/15/17 1656 05/16/17 0120  WBC 9.4 8.2  CREATININE 1.00 0.93    Estimated Creatinine Clearance: 43.2 mL/min (by C-G formula based on SCr of 0.93 mg/dL).    Allergies  Allergen Reactions  . Penicillins Hives    .Has patient had a PCN reaction causing immediate rash, facial/tongue/throat swelling, SOB or lightheadedness with hypotension: Unknown Has patient had a PCN reaction causing severe rash involving mucus membranes or skin necrosis: Unknown Has patient had a PCN reaction that required hospitalization: Unknown Has patient had a PCN reaction occurring within the last 10 years: Unknown If all of the above answers are "NO", then may proceed with Cephalosporin use.   . Captopril Other (See Comments)  . Enalapril Maleate     Other reaction(s): Unknown  . Seldane  [Terfenadine] Other (See Comments)  . Tape Itching  . Augmentin [Amoxicillin-Pot Clavulanate] Rash  . Biaxin [Clarithromycin] Rash, Other (See Comments) and Hives    Other reaction(s): Unknown    Antimicrobials this admission: 9/30 Vancomycin >> once 9/30 Aztreonam >>  9/30 Levofloxacin >>   Microbiology results: 9/30 Body Fluid Cx: Sent    Thank you for allowing pharmacy to be a part of this patient's care.  [place] Resident  05/16/2017 8:35 AM

## 2017-05-16 NOTE — Progress Notes (Signed)
[place] NAME: Janice Perkins    MR#:  [id number]  DATE OF BIRTH:  24-Jan-1933  SUBJECTIVE:  CHIEF COMPLAINT:   Chief Complaint  Patient presents with  . Shortness of Breath   Still has SOB and cough. Feels weak REVIEW OF SYSTEMS:    Review of Systems  Constitutional: Positive for malaise/fatigue. Negative for chills and fever.  HENT: Negative for sore throat.   Eyes: Negative for blurred vision, double vision and pain.  Respiratory: Positive for cough and shortness of breath. Negative for hemoptysis and wheezing.   Cardiovascular: Negative for chest pain, palpitations, orthopnea and leg swelling.  Gastrointestinal: Negative for abdominal pain, constipation, diarrhea, heartburn, nausea and vomiting.  Genitourinary: Negative for dysuria and hematuria.  Musculoskeletal: Negative for back pain and joint pain.  Skin: Negative for rash.  Neurological: Positive for weakness. Negative for sensory change, speech change, focal weakness and headaches.  Endo/Heme/Allergies: Does not bruise/bleed easily.  Psychiatric/Behavioral: Negative for depression. The patient is not nervous/anxious.     DRUG ALLERGIES:   Allergies  Allergen Reactions  . Penicillins Hives    .Has patient had a PCN reaction causing immediate rash, facial/tongue/throat swelling, SOB or lightheadedness with hypotension: Unknown Has patient had a PCN reaction causing severe rash involving mucus membranes or skin necrosis: Unknown Has patient had a PCN reaction that required hospitalization: Unknown Has patient had a PCN reaction occurring within the last 10 years: Unknown If all of the above answers are "NO", then may proceed with Cephalosporin use.   . Captopril Other (See Comments)  . Enalapril Maleate     Other reaction(s): Unknown  . Seldane  [Terfenadine] Other (See Comments)  . Tape Itching  . Augmentin [Amoxicillin-Pot Clavulanate] Rash  . Biaxin  [Clarithromycin] Rash, Other (See Comments) and Hives    Other reaction(s): Unknown    VITALS:  Blood pressure (!) 155/80, pulse 83, temperature 98.5 F (36.9 C), temperature source Axillary, resp. rate 20, weight 78.6 kg (173 lb 4.8 oz), SpO2 90 %.  PHYSICAL EXAMINATION:   Physical Exam  GENERAL:  83 y.o.-year-old patient lying in the bed with no acute distress.  EYES: Pupils equal, round, reactive to light and accommodation. No scleral icterus. Extraocular muscles intact.  HEENT: Head atraumatic, normocephalic. Oropharynx and nasopharynx clear.  NECK:  Supple, no jugular venous distention. No thyroid enlargement, no tenderness.  LUNGS: Normal breath sounds bilaterally, no wheezing, rales, rhonchi. No use of accessory muscles of respiration.  CARDIOVASCULAR: S1, S2 normal. No murmurs, rubs, or gallops.  ABDOMEN: Soft, nontender, nondistended. Bowel sounds present. No organomegaly or mass.  EXTREMITIES: No cyanosis, clubbing or edema b/l.    NEUROLOGIC: Cranial nerves II through XII are intact. No focal Motor or sensory deficits b/l.   PSYCHIATRIC: The patient is alert and oriented x 3.  SKIN: No obvious rash, lesion, or ulcer.   LABORATORY PANEL:   CBC  Recent Labs Lab 05/16/17 0120  WBC 8.2  HGB 13.4  HCT 39.6  PLT 187   ------------------------------------------------------------------------------------------------------------------ Chemistries   Recent Labs Lab 05/16/17 0120  NA 130*  K 3.5  CL 90*  CO2 29  GLUCOSE 126*  BUN 14  CREATININE 0.93  CALCIUM 9.5   ------------------------------------------------------------------------------------------------------------------  Cardiac Enzymes  Recent Labs Lab 05/15/17 1656  TROPONINI <0.03   ------------------------------------------------------------------------------------------------------------------  RADIOLOGY:  Dg Chest 2 View  Result Date: 05/15/2017 CLINICAL DATA:  Pt to ed with c/o sob, and  cough. Pt states hx  of fluid retention and dizziness. Hx/o A-fib, breast cancer, bronchitis, COPD, HTN, and shingles EXAM: CHEST  2 VIEW COMPARISON:  03/30/2017 FINDINGS: Heart is enlarged. There is a right pleural effusion associated right basilar atelectasis or infiltrate which obscures the hemidiaphragm. There is mild perihilar peribronchial thickening. No pulmonary edema. Left shoulder arthroplasty. IMPRESSION: 1. Cardiomegaly without pulmonary edema. 2. Right pleural effusion and right lower lobe atelectasis or infiltrate. Electronically Signed   By: Nolon Nations M.D.   On: 05/15/2017 18:09     ASSESSMENT AND PLAN:   * Right pleural effusion I dont think this is pneumonia Afebrile. Normal WBC. Negative procalcitonin. Will change broad-spectrum IV antibiotics to Levaquin to get thoracentesis. Cultures pending. Consulted her pulmonologist.  * chf? She does have elevated BNP and associated hyponatremia. Seems hypervolemic hyponatremia. We'll give her IV Lasix and reassess in the morning. Repeat labs in a.m.  * History of breast cancer. Tumor markers ordered at admission.  * COPD. No wheezing.  * Hypertension. Continue home medications.  * DVT prophylaxis with Lovenox  All the records are reviewed and case discussed with Care Management/Social Worker Management plans discussed with the patient, family and they are in agreement.  CODE STATUS: FULL CODE  DVT Prophylaxis: SCDs  TOTAL TIME TAKING CARE OF THIS PATIENT: 30 minutes.   POSSIBLE D/C IN 1-2 DAYS, DEPENDING ON CLINICAL CONDITION.  Hillary Bow R M.D on 05/16/2017 at 11:57 AM  Between 7am to 6pm - Pager - [phone]  After 6pm go to [email or web address] - password [email or web address] [facility]  Office  [phone]  CC: Primary care physician; [name], MD  Note: This dictation was prepared with Dragon dictation along with smaller phrase technology. Any transcriptional errors that result from  this process are unintentional.

## 2017-05-16 NOTE — ED Notes (Signed)
Pt resting in bed; admitting MD has been in to see pt; waiting on room assignment

## 2017-05-16 NOTE — Progress Notes (Signed)
Pharmacy Antibiotic Note  Janice Perkins is a 83 y.o. female admitted on 05/15/2017 with pneumonia.  Pharmacy has been consulted for vancomycin dosing.  Plan: Patient received vanc 1g IV x 1  Will f/u w/ vanc 1.25g IV q24h w/ 6 hours stack dose. Will draw a vanc trough 10/03 @ 0700 prior to 4th dose. Patient is also on aztreonam 1g IV q8h--might be able to get away w/ zosyn for anaerobic coverage, pt has unknown reaction to PCNs, but low severity rash w/ augmentin.  Ke 0.0377 T1/2 18 ~ 24 hrs and increased dose to 1.25g IV for ease of administration Goal trough 15 - 20 mcg/mL  Weight: 173 lb 4.8 oz (78.6 kg)  Temp (24hrs), Avg:98 F (36.7 C), Min:97.6 F (36.4 C), Max:98.5 F (36.9 C)   Recent Labs Lab 05/15/17 1656 05/16/17 0120  WBC 9.4 8.2  CREATININE 1.00  --     Estimated Creatinine Clearance: 40.2 mL/min (by C-G formula based on SCr of 1 mg/dL).    Allergies  Allergen Reactions  . Penicillins Hives    .Has patient had a PCN reaction causing immediate rash, facial/tongue/throat swelling, SOB or lightheadedness with hypotension: Unknown Has patient had a PCN reaction causing severe rash involving mucus membranes or skin necrosis: Unknown Has patient had a PCN reaction that required hospitalization: Unknown Has patient had a PCN reaction occurring within the last 10 years: Unknown If all of the above answers are "NO", then may proceed with Cephalosporin use.   . Captopril Other (See Comments)  . Enalapril Maleate     Other reaction(s): Unknown  . Seldane  [Terfenadine] Other (See Comments)  . Tape Itching  . Augmentin [Amoxicillin-Pot Clavulanate] Rash  . Biaxin [Clarithromycin] Rash, Other (See Comments) and Hives    Other reaction(s): Unknown    Thank you for allowing pharmacy to be a part of this patient's care.  Tobie Lords, PharmD, BCPS Clinical Pharmacist 05/16/2017

## 2017-05-17 ENCOUNTER — Inpatient Hospital Stay: Payer: Medicare Other

## 2017-05-17 LAB — BASIC METABOLIC PANEL
Anion gap: 9 (ref 5–15)
BUN: 17 mg/dL (ref 6–20)
CALCIUM: 9 mg/dL (ref 8.9–10.3)
CO2: 30 mmol/L (ref 22–32)
Chloride: 91 mmol/L — ABNORMAL LOW (ref 101–111)
Creatinine, Ser: 0.97 mg/dL (ref 0.44–1.00)
GFR calc Af Amer: >60 mL/min (ref >60)
GFR, EST NON AFRICAN AMERICAN: 53 mL/min — AB (ref >60)
GLUCOSE: 152 mg/dL — AB (ref 65–99)
Potassium: 3.9 mmol/L (ref 3.5–5.1)
Sodium: 130 mmol/L — ABNORMAL LOW (ref 135–145)

## 2017-05-17 NOTE — Progress Notes (Signed)
[place] at [place] NAME: Janice Perkins    MR#:  [id number]  DATE OF BIRTH:  1932/10/22  SUBJECTIVE:  Feels a lot better today. Breathing comfortably  REVIEW OF SYSTEMS:   Review of Systems  Constitutional: Negative for chills, fever and weight loss.  HENT: Negative for ear discharge, ear pain and nosebleeds.   Eyes: Negative for blurred vision, pain and discharge.  Respiratory: Negative for sputum production, shortness of breath, wheezing and stridor.   Cardiovascular: Negative for chest pain, palpitations, orthopnea and PND.  Gastrointestinal: Negative for abdominal pain, diarrhea, nausea and vomiting.  Genitourinary: Negative for frequency and urgency.  Musculoskeletal: Negative for back pain and joint pain.  Neurological: Positive for weakness. Negative for sensory change, speech change and focal weakness.  Psychiatric/Behavioral: Negative for depression and hallucinations. The patient is not nervous/anxious.    Tolerating Diet:yes Tolerating PT: ambulatory  DRUG ALLERGIES:   Allergies  Allergen Reactions  . Penicillins Hives    .Has patient had a PCN reaction causing immediate rash, facial/tongue/throat swelling, SOB or lightheadedness with hypotension: Unknown Has patient had a PCN reaction causing severe rash involving mucus membranes or skin necrosis: Unknown Has patient had a PCN reaction that required hospitalization: Unknown Has patient had a PCN reaction occurring within the last 10 years: Unknown If all of the above answers are "NO", then may proceed with Cephalosporin use.   . Captopril Other (See Comments)  . Enalapril Maleate     Other reaction(s): Unknown  . Seldane  [Terfenadine] Other (See Comments)  . Tape Itching  . Augmentin [Amoxicillin-Pot Clavulanate] Rash  . Biaxin [Clarithromycin] Rash, Other (See Comments) and Hives    Other reaction(s): Unknown    VITALS:  Blood pressure (!) 163/88, pulse 76,  temperature 98.3 F (36.8 C), temperature source Oral, resp. rate 18, height 5\' 2"  (1.575 m), weight 78.6 kg (173 lb 4.8 oz), SpO2 94 %.  PHYSICAL EXAMINATION:   Physical Exam  GENERAL:  83 y.o.-year-old patient lying in the bed with no acute distress.  EYES: Pupils equal, round, reactive to light and accommodation. No scleral icterus. Extraocular muscles intact.  HEENT: Head atraumatic, normocephalic. Oropharynx and nasopharynx clear.  NECK:  Supple, no jugular venous distention. No thyroid enlargement, no tenderness.  LUNGS: Normal breath sounds bilaterally, no wheezing, rales, rhonchi. No use of accessory muscles of respiration.  CARDIOVASCULAR: S1, S2 normal. No murmurs, rubs, or gallops.  ABDOMEN: Soft, nontender, nondistended. Bowel sounds present. No organomegaly or mass.  EXTREMITIES: No cyanosis, clubbing or edema b/l.    NEUROLOGIC: Cranial nerves II through XII are intact. No focal Motor or sensory deficits b/l.   PSYCHIATRIC:  patient is alert and oriented x 3.  SKIN: No obvious rash, lesion, or ulcer.   LABORATORY PANEL:  CBC  Recent Labs Lab 05/16/17 0120  WBC 8.2  HGB 13.4  HCT 39.6  PLT 187    Chemistries   Recent Labs Lab 05/17/17 0309  NA 130*  K 3.9  CL 91*  CO2 30  GLUCOSE 152*  BUN 17  CREATININE 0.97  CALCIUM 9.0   Cardiac Enzymes  Recent Labs Lab 05/15/17 1656  TROPONINI <0.03   RADIOLOGY:  Dg Chest 2 View  Result Date: 05/15/2017 CLINICAL DATA:  Pt to ed with c/o sob, and cough. Pt states hx of fluid retention and dizziness. Hx/o A-fib, breast cancer, bronchitis, COPD, HTN, and shingles EXAM: CHEST  2 VIEW COMPARISON:  03/30/2017 FINDINGS: Heart is  enlarged. There is a right pleural effusion associated right basilar atelectasis or infiltrate which obscures the hemidiaphragm. There is mild perihilar peribronchial thickening. No pulmonary edema. Left shoulder arthroplasty. IMPRESSION: 1. Cardiomegaly without pulmonary edema. 2. Right  pleural effusion and right lower lobe atelectasis or infiltrate. Electronically Signed   By: Nolon Nations M.D.   On: 05/15/2017 18:09   ASSESSMENT AND PLAN:   83 year old female who states that for the past week she is just not felt right. She reports nausea and being of breath easily. She reports pain in her left upper and lower quadrants. She reports being lethargic. She's had a nonproductive cough for the last week and wheezing last 5 days  * recurrent Right pleural effusion I dont think this is pneumonia---d/c antibiotics, pt informed Afebrile. Normal WBC. Negative procalcitonin. -attempt to get right sided thoracentesis (last done aug 2018--transudate) -Consulted her pulmonologist dr Raul Del  * Acute diastolic chf-new She does have elevated BNP and associated hyponatremia. Seems hypervolemic hyponatremia. - recieved IV Lasix yday and diuresed 1800cc yday. Feels a lot better -Echo in feb 2018 showe ef 55% moderate MR  *h/o PAF Rate controlled Follows with dr Ubaldo Glassing  * History of breast cancer. Tumor markers ordered at admission.  * COPD. No wheezing. Received Iv solumederol for couple days. D/c now  * Hypertension. Continue home medications.  * DVT prophylaxis with Lovenox  D/c home after thoracentesis.  Case discussed with Care Management/Social Worker. Management plans discussed with the patient, family and they are in agreement.  CODE STATUS: Full   TOTAL TIME TAKING CARE OF THIS PATIENT: **40* minutes.  >50% time spent on counselling and coordination of care  POSSIBLE D/C IN 1 DAYS, DEPENDING ON CLINICAL CONDITION.  Note: This dictation was prepared with Dragon dictation along with smaller phrase technology. Any transcriptional errors that result from this process are unintentional.  Sumayya Muha M.D on 05/17/2017 at 7:30 AM  Between 7am to 6pm - Pager - [phone]  After 6pm go to [email or web address] - Proofreader  [facility]  Office   [phone]  CC: Primary care physician; [name], MDPatient ID: Janice Perkins, female   DOB: [birth year], 83 y.o.   MRN: [id number]

## 2017-05-17 NOTE — Discharge Summary (Signed)
[place] at [place] NAME: Janice Perkins    MR#:  [id number]  DATE OF BIRTH:  Feb 28, 1933  DATE OF ADMISSION:  05/15/2017 ADMITTING PHYSICIAN: Quintella Baton, MD  DATE OF DISCHARGE: 05/17/17  PRIMARY CARE PHYSICIAN: [name], MD    ADMISSION DIAGNOSIS:  Hyponatremia [E87.1] SOB (shortness of breath) [R06.02] Pleural effusion [J90] Hypoxia [R09.02] Dyspnea, unspecified type [R06.00]  DISCHARGE DIAGNOSIS:  Recurrent right Pleural Effusion Acute Diastolic CHF   SECONDARY DIAGNOSIS:   Past Medical History:  Diagnosis Date  . Asthma   . Atrial fibrillation ([place])   . Breast cancer ([place]) 2011  . Bronchitis 04/2015  . Cancer [facility]) 2011   breast  . COPD (chronic obstructive pulmonary disease) ([place])   . Hypertension   . Shingles    10/2015    HOSPITAL COURSE:   83 year old female who states that for the past week she is just not felt right. She reports nausea and beingof breath easily. She reports pain in her left upper and lower quadrants. She reports being lethargic. She'shad a nonproductive cough for the last week and wheezinglast 5 days  * recurrent Right pleural effusion I dont think this is pneumonia---d/c antibiotics, pt informed Afebrile. Normal WBC. Negative procalcitonin. -attempt to get right sided thoracentesis (last done aug 2018--transudate) -Consulted her pulmonologist dr Raul Del  * Acute diastolic chf-new She does have elevated BNP and associated hyponatremia. Seems hypervolemic hyponatremia. - recieved IV Lasix yday and diuresed 1800cc yday. Feels a lot better -Echo in feb 2018 showe ef 55% moderate MR -S/p US thoracentesis with 750 cc fluid removal  *h/o PAF Rate controlled Follows with dr Ubaldo Glassing  * History of breast cancer. Tumor markers ordered at admission.  * COPD. No wheezing. Received Iv solumederol for couple days. D/c now  * Hypertension. Continue home medications.  * DVT  prophylaxis with Lovenox  Overall improving D/c home CONSULTS OBTAINED:    DRUG ALLERGIES:   Allergies  Allergen Reactions  . Penicillins Hives    .Has patient had a PCN reaction causing immediate rash, facial/tongue/throat swelling, SOB or lightheadedness with hypotension: Unknown Has patient had a PCN reaction causing severe rash involving mucus membranes or skin necrosis: Unknown Has patient had a PCN reaction that required hospitalization: Unknown Has patient had a PCN reaction occurring within the last 10 years: Unknown If all of the above answers are "NO", then may proceed with Cephalosporin use.   . Captopril Other (See Comments)  . Enalapril Maleate     Other reaction(s): Unknown  . Seldane  [Terfenadine] Other (See Comments)  . Tape Itching  . Augmentin [Amoxicillin-Pot Clavulanate] Rash  . Biaxin [Clarithromycin] Rash, Other (See Comments) and Hives    Other reaction(s): Unknown    DISCHARGE MEDICATIONS:   Current Discharge Medication List    CONTINUE these medications which have NOT CHANGED   Details  acetaminophen (TYLENOL) 325 MG tablet Take 2 tablets (650 mg total) by mouth every 6 (six) hours as needed for mild pain (or Fever >/= 101).    albuterol (PROVENTIL HFA;VENTOLIN HFA) 108 (90 Base) MCG/ACT inhaler Inhale 1-2 puffs into the lungs every 6 (six) hours as needed for wheezing or shortness of breath.    ALPRAZolam (XANAX) 0.25 MG tablet Take 1 tablet (0.25 mg total) by mouth 3 (three) times daily as needed for anxiety. Qty: 15 tablet, Refills: 0    aspirin 81 MG tablet Take 81 mg by mouth daily.     atorvastatin (  LIPITOR) 20 MG tablet Take 20 mg by mouth daily.    cholecalciferol (VITAMIN D) 1000 units tablet Take 1,000 Units by mouth daily.    esomeprazole (NEXIUM) 40 MG capsule Take 40 mg by mouth daily at 12 noon.    ferrous sulfate 325 (65 FE) MG tablet Take 325 mg by mouth daily with breakfast.    FLUoxetine (PROZAC) 20 MG capsule Take 20 mg  by mouth 2 (two) times daily.    fluticasone (FLONASE) 50 MCG/ACT nasal spray Place 2 sprays into both nostrils daily.     Fluticasone-Salmeterol (ADVAIR) 250-50 MCG/DOSE AEPB Inhale 1 puff into the lungs 2 (two) times daily.    HYDROcodone-acetaminophen (NORCO/VICODIN) 5-325 MG tablet Take 1 tablet by mouth as needed.    levothyroxine (SYNTHROID, LEVOTHROID) 75 MCG tablet Take 1 tablet (75 mcg total) by mouth daily before breakfast.    losartan (COZAAR) 100 MG tablet Take 1 tablet (100 mg total) by mouth daily.    magnesium oxide (MAG-OX) 400 MG tablet Take 400 mg by mouth 2 (two) times daily.    metoprolol succinate (TOPROL-XL) 100 MG 24 hr tablet Take 1 tablet (100 mg total) by mouth 2 (two) times daily with a meal. Take with or immediately following a meal.    ondansetron (ZOFRAN ODT) 4 MG disintegrating tablet Take 1 tablet (4 mg total) by mouth every 8 (eight) hours as needed for nausea or vomiting. Qty: 20 tablet, Refills: 0    senna (SENOKOT) 8.6 MG TABS tablet Take 1 tablet by mouth daily as needed for mild constipation.    torsemide (DEMADEX) 5 MG tablet Take 5 mg by mouth daily.    dicyclomine (BENTYL) 10 MG capsule Take 10 mg by mouth 2 (two) times daily.    docusate sodium (COLACE) 100 MG capsule Take 1 capsule (100 mg total) by mouth daily. Qty: 10 capsule, Refills: 0    hydrALAZINE (APRESOLINE) 25 MG tablet Take 1 tablet (25 mg total) by mouth every 6 (six) hours.    metroNIDAZOLE (FLAGYL) 500 MG tablet Take 500 mg by mouth 2 (two) times daily.      STOP taking these medications     ciprofloxacin (CIPRO) 500 MG tablet      furosemide (LASIX) 20 MG tablet      predniSONE (DELTASONE) 10 MG tablet         If you experience worsening of your admission symptoms, develop shortness of breath, life threatening emergency, suicidal or homicidal thoughts you must seek medical attention immediately by calling 911 or calling your MD immediately  if symptoms less  severe.  You Must read complete instructions/literature along with all the possible adverse reactions/side effects for all the Medicines you take and that have been prescribed to you. Take any new Medicines after you have completely understood and accept all the possible adverse reactions/side effects.   Please note  You were cared for by a hospitalist during your hospital stay. If you have any questions about your discharge medications or the care you received while you were in the hospital after you are discharged, you can call the unit and asked to speak with the hospitalist on call if the hospitalist that took care of you is not available. Once you are discharged, your primary care physician will handle any further medical issues. Please note that NO REFILLS for any discharge medications will be authorized once you are discharged, as it is imperative that you return to your primary care physician (or establish a relationship  with a primary care physician if you do not have one) for your aftercare needs so that they can reassess your need for medications and monitor your lab values.    DATA REVIEW:   CBC   Recent Labs Lab 05/16/17 0120  WBC 8.2  HGB 13.4  HCT 39.6  PLT 187    Chemistries   Recent Labs Lab 05/17/17 0309  NA 130*  K 3.9  CL 91*  CO2 30  GLUCOSE 152*  BUN 17  CREATININE 0.97  CALCIUM 9.0    Microbiology Results   No results found for this or any previous visit (from the past 240 hour(s)).  RADIOLOGY:  Dg Chest 1 View  Result Date: 05/17/2017 CLINICAL DATA:  Status post right sided thoracentesis. History of breast malignancy, asthma -COPD. EXAM: CHEST 1 VIEW COMPARISON:  Chest x-ray of May 15, 2017 FINDINGS: There has been marked reduction in the volume of the right pleural effusion minimal blunting of the right lateral costophrenic angle persists. There is no left pleural effusion. The cardiac silhouette remains enlarged. The central pulmonary  vascularity remains mildly engorged. There is calcification in the wall of the aortic arch. The bony thorax exhibits no acute abnormality. IMPRESSION: No postprocedure complication following right thoracentesis. Mild CHF. Thoracic aortic atherosclerosis. Electronically Signed   By: David  Martinique M.D.   On: 05/17/2017 10:53   Dg Chest 2 View  Result Date: 05/15/2017 CLINICAL DATA:  Pt to ed with c/o sob, and cough. Pt states hx of fluid retention and dizziness. Hx/o A-fib, breast cancer, bronchitis, COPD, HTN, and shingles EXAM: CHEST  2 VIEW COMPARISON:  03/30/2017 FINDINGS: Heart is enlarged. There is a right pleural effusion associated right basilar atelectasis or infiltrate which obscures the hemidiaphragm. There is mild perihilar peribronchial thickening. No pulmonary edema. Left shoulder arthroplasty. IMPRESSION: 1. Cardiomegaly without pulmonary edema. 2. Right pleural effusion and right lower lobe atelectasis or infiltrate. Electronically Signed   By: Nolon Nations M.D.   On: 05/15/2017 18:09   US Thoracentesis Asp Pleural Space W/img Guide  Result Date: 05/17/2017 CLINICAL DATA:  Right pleural effusion. EXAM: ULTRASOUND GUIDED RIGHT THORACENTESIS COMPARISON:  None. PROCEDURE: An ultrasound guided thoracentesis was thoroughly discussed with the patient and questions answered. The benefits, risks, alternatives and complications were also discussed. The patient understands and wishes to proceed with the procedure. Written consent was obtained. A time-out was performed prior to initiating the procedure. Ultrasound was performed to localize and mark an adequate pocket of fluid in the right chest. The area was then prepped and draped in the normal sterile fashion. 1% Lidocaine was used for local anesthesia. Under ultrasound guidance a 6 French Safe-T-Centesis catheter was introduced. Thoracentesis was performed. The catheter was removed and a dressing applied. COMPLICATIONS: None FINDINGS: A total of  approximately 750 mL of yellowish fluid was removed. IMPRESSION: Successful ultrasound guided right thoracentesis yielding 750 mL of pleural fluid. Electronically Signed   By: Aletta Edouard M.D.   On: 05/17/2017 12:22     Management plans discussed with the patient, family and they are in agreement.  CODE STATUS:     Code Status Orders        Start     Ordered   05/16/17 0042  Full code  Continuous     05/16/17 0041    Code Status History    Date Active Date Inactive Code Status Order ID Comments User Context   03/07/2017  2:46 PM 03/09/2017  3:09 PM Full Code [id number]  Idelle Crouch, MD Inpatient   01/19/2017  8:37 AM 01/21/2017  2:15 PM Full Code [id number]  Harrie Foreman, MD Inpatient   12/18/2016  7:18 AM 12/22/2016  7:47 PM Full Code [id number]  Harrie Foreman, MD Inpatient    Advance Directive Documentation     Most Recent Value  Type of Advance Directive  Healthcare Power of Attorney  Pre-existing out of facility DNR order (yellow form or pink MOST form)  -  "MOST" Form in Place?  -      TOTAL TIME TAKING CARE OF THIS PATIENT: 40 minutes.    Avigail Pilling M.D on 05/17/2017 at 12:34 PM  Between 7am to 6pm - Pager - [phone] After 6pm go to [email or web address] - password [email or web address]  Office  [phone]  CC: Primary care physician; [name], MD

## 2017-05-17 NOTE — Procedures (Signed)
Interventional Radiology Procedure Note  Procedure: US guided right thoracentesis  Complications: None  Estimated Blood Loss: None  750 mL yellow fluid removed from right pleural space.  Venetia Night. Kathlene Cote, M.D Pager:  [phone]

## 2017-05-17 NOTE — Discharge Planning (Signed)
Patient IV removed.  Discharge papers given, explained and educated.  Informed of suggested FU appt and appts made. No scripts needed at this time,  RN assessment and VS revealed stability for DC to home.  Once ready, will be wheeled to front and family transporting home via car.

## 2017-05-18 LAB — CANCER ANTIGEN 15-3: CAN 15 3: 16.3 U/mL (ref 0.0–25.0)

## 2017-05-18 LAB — CEA: CEA: 6.4 ng/mL — ABNORMAL HIGH (ref 0.0–4.7)

## 2017-05-18 LAB — CANCER ANTIGEN 27.29: CAN 27.29: 16.2 U/mL (ref 0.0–38.6)

## 2017-05-18 LAB — AFP TUMOR MARKER: AFP, SERUM, TUMOR MARKER: 2.9 ng/mL (ref 0.0–8.3)

## 2017-05-18 LAB — CA 125: CANCER ANTIGEN (CA) 125: 169 U/mL — AB (ref 0.0–38.1)

## 2017-05-18 LAB — CANCER ANTIGEN 19-9: CA 19-9: 57 U/mL — ABNORMAL HIGH (ref 0–35)

## 2017-07-14 ENCOUNTER — Other Ambulatory Visit: Payer: Self-pay | Admitting: Physical Medicine and Rehabilitation

## 2017-07-14 DIAGNOSIS — M545 Low back pain: Secondary | ICD-10-CM

## 2017-07-21 ENCOUNTER — Ambulatory Visit
Admission: RE | Admit: 2017-07-21 | Discharge: 2017-07-21 | Disposition: A | Discharge status: Home / Self Care | Payer: Medicare Other | Source: Ambulatory Visit | Attending: Physical Medicine and Rehabilitation | Admitting: Physical Medicine and Rehabilitation

## 2017-07-21 DIAGNOSIS — M48061 Spinal stenosis, lumbar region without neurogenic claudication: Secondary | ICD-10-CM | POA: Insufficient documentation

## 2017-07-21 DIAGNOSIS — M545 Low back pain: Secondary | ICD-10-CM | POA: Diagnosis present

## 2017-07-21 DIAGNOSIS — M5126 Other intervertebral disc displacement, lumbar region: Secondary | ICD-10-CM | POA: Diagnosis not present

## 2017-11-15 ENCOUNTER — Other Ambulatory Visit: Payer: Self-pay

## 2017-11-15 ENCOUNTER — Inpatient Hospital Stay
Admission: AD | Admit: 2017-11-15 | Discharge: 2017-11-21 | DRG: 193 | Disposition: A | Discharge status: Home Health | Payer: Medicare Other | Source: Ambulatory Visit | Attending: Internal Medicine | Admitting: Internal Medicine

## 2017-11-15 ENCOUNTER — Inpatient Hospital Stay: Payer: Medicare Other

## 2017-11-15 DIAGNOSIS — J040 Acute laryngitis: Secondary | ICD-10-CM | POA: Diagnosis present

## 2017-11-15 DIAGNOSIS — Z803 Family history of malignant neoplasm of breast: Secondary | ICD-10-CM

## 2017-11-15 DIAGNOSIS — I482 Chronic atrial fibrillation: Secondary | ICD-10-CM | POA: Diagnosis present

## 2017-11-15 DIAGNOSIS — Z7982 Long term (current) use of aspirin: Secondary | ICD-10-CM

## 2017-11-15 DIAGNOSIS — J189 Pneumonia, unspecified organism: Secondary | ICD-10-CM

## 2017-11-15 DIAGNOSIS — Z853 Personal history of malignant neoplasm of breast: Secondary | ICD-10-CM

## 2017-11-15 DIAGNOSIS — Z79891 Long term (current) use of opiate analgesic: Secondary | ICD-10-CM

## 2017-11-15 DIAGNOSIS — R0782 Intercostal pain: Secondary | ICD-10-CM | POA: Diagnosis not present

## 2017-11-15 DIAGNOSIS — J181 Lobar pneumonia, unspecified organism: Secondary | ICD-10-CM | POA: Diagnosis present

## 2017-11-15 DIAGNOSIS — E871 Hypo-osmolality and hyponatremia: Secondary | ICD-10-CM | POA: Diagnosis not present

## 2017-11-15 DIAGNOSIS — R042 Hemoptysis: Secondary | ICD-10-CM | POA: Diagnosis present

## 2017-11-15 DIAGNOSIS — E785 Hyperlipidemia, unspecified: Secondary | ICD-10-CM | POA: Diagnosis present

## 2017-11-15 DIAGNOSIS — E860 Dehydration: Secondary | ICD-10-CM | POA: Diagnosis not present

## 2017-11-15 DIAGNOSIS — Z923 Personal history of irradiation: Secondary | ICD-10-CM | POA: Diagnosis not present

## 2017-11-15 DIAGNOSIS — J439 Emphysema, unspecified: Secondary | ICD-10-CM | POA: Diagnosis present

## 2017-11-15 DIAGNOSIS — J9621 Acute and chronic respiratory failure with hypoxia: Secondary | ICD-10-CM | POA: Diagnosis present

## 2017-11-15 DIAGNOSIS — R5383 Other fatigue: Secondary | ICD-10-CM | POA: Diagnosis present

## 2017-11-15 DIAGNOSIS — Z881 Allergy status to other antibiotic agents status: Secondary | ICD-10-CM

## 2017-11-15 DIAGNOSIS — Z8619 Personal history of other infectious and parasitic diseases: Secondary | ICD-10-CM | POA: Diagnosis not present

## 2017-11-15 DIAGNOSIS — Z8701 Personal history of pneumonia (recurrent): Secondary | ICD-10-CM | POA: Diagnosis not present

## 2017-11-15 DIAGNOSIS — I10 Essential (primary) hypertension: Secondary | ICD-10-CM | POA: Diagnosis present

## 2017-11-15 DIAGNOSIS — F329 Major depressive disorder, single episode, unspecified: Secondary | ICD-10-CM | POA: Diagnosis present

## 2017-11-15 DIAGNOSIS — M25512 Pain in left shoulder: Secondary | ICD-10-CM | POA: Diagnosis not present

## 2017-11-15 DIAGNOSIS — Z79899 Other long term (current) drug therapy: Secondary | ICD-10-CM

## 2017-11-15 DIAGNOSIS — N179 Acute kidney failure, unspecified: Secondary | ICD-10-CM | POA: Diagnosis present

## 2017-11-15 DIAGNOSIS — Z7951 Long term (current) use of inhaled steroids: Secondary | ICD-10-CM

## 2017-11-15 DIAGNOSIS — R0602 Shortness of breath: Secondary | ICD-10-CM

## 2017-11-15 DIAGNOSIS — Z880 Allergy status to penicillin: Secondary | ICD-10-CM

## 2017-11-15 DIAGNOSIS — Z8249 Family history of ischemic heart disease and other diseases of the circulatory system: Secondary | ICD-10-CM | POA: Diagnosis not present

## 2017-11-15 DIAGNOSIS — G8929 Other chronic pain: Secondary | ICD-10-CM | POA: Diagnosis present

## 2017-11-15 DIAGNOSIS — Z79890 Hormone replacement therapy (postmenopausal): Secondary | ICD-10-CM

## 2017-11-15 DIAGNOSIS — Z91048 Other nonmedicinal substance allergy status: Secondary | ICD-10-CM

## 2017-11-15 DIAGNOSIS — Z888 Allergy status to other drugs, medicaments and biological substances status: Secondary | ICD-10-CM

## 2017-11-15 DIAGNOSIS — Z823 Family history of stroke: Secondary | ICD-10-CM

## 2017-11-15 DIAGNOSIS — Z8709 Personal history of other diseases of the respiratory system: Secondary | ICD-10-CM

## 2017-11-15 DIAGNOSIS — Z9981 Dependence on supplemental oxygen: Secondary | ICD-10-CM

## 2017-11-15 DIAGNOSIS — M549 Dorsalgia, unspecified: Secondary | ICD-10-CM | POA: Diagnosis present

## 2017-11-15 LAB — CBC
HCT: 36.6 % (ref 35.0–47.0)
HEMOGLOBIN: 12.6 g/dL (ref 12.0–16.0)
MCH: 30 pg (ref 26.0–34.0)
MCHC: 34.4 g/dL (ref 32.0–36.0)
MCV: 87.4 fL (ref 80.0–100.0)
Platelets: 155 10*3/uL (ref 150–440)
RBC: 4.19 MIL/uL (ref 3.80–5.20)
RDW: 16 % — AB (ref 11.5–14.5)
WBC: 27.3 10*3/uL — AB (ref 3.6–11.0)

## 2017-11-15 LAB — CREATININE, SERUM
CREATININE: 1.27 mg/dL — AB (ref 0.44–1.00)
GFR calc Af Amer: 44 mL/min — ABNORMAL LOW (ref >60)
GFR, EST NON AFRICAN AMERICAN: 38 mL/min — AB (ref >60)

## 2017-11-15 LAB — INFLUENZA PANEL BY PCR (TYPE A & B)
INFLBPCR: NEGATIVE
Influenza A By PCR: NEGATIVE

## 2017-11-15 MED ORDER — HEPARIN SODIUM (PORCINE) 5000 UNIT/ML IJ SOLN
5000.0000 [IU] | Freq: Three times a day (TID) | INTRAMUSCULAR | Status: DC
  Administered 2017-11-15 – 2017-11-19 (×12): 5000 [IU] via SUBCUTANEOUS
  Filled 2017-11-15 (×12): qty 1

## 2017-11-15 MED ORDER — GUAIFENESIN-DM 100-10 MG/5ML PO SYRP
5.0000 mL | ORAL_SOLUTION | ORAL | Status: DC | PRN
  Administered 2017-11-15 – 2017-11-18 (×9): 5 mL via ORAL
  Filled 2017-11-15 (×12): qty 5

## 2017-11-15 MED ORDER — SENNA 8.6 MG PO TABS
1.0000 | ORAL_TABLET | Freq: Every day | ORAL | Status: DC | PRN
  Administered 2017-11-19 – 2017-11-21 (×3): 8.6 mg via ORAL
  Filled 2017-11-15 (×3): qty 1

## 2017-11-15 MED ORDER — LEVOTHYROXINE SODIUM 25 MCG PO TABS
75.0000 ug | ORAL_TABLET | Freq: Every day | ORAL | Status: DC
  Administered 2017-11-16 – 2017-11-21 (×6): 75 ug via ORAL
  Filled 2017-11-15 (×6): qty 1

## 2017-11-15 MED ORDER — ONDANSETRON 4 MG PO TBDP
4.0000 mg | ORAL_TABLET | Freq: Three times a day (TID) | ORAL | Status: DC | PRN
  Administered 2017-11-15 – 2017-11-21 (×3): 4 mg via ORAL
  Filled 2017-11-15 (×4): qty 1

## 2017-11-15 MED ORDER — ATORVASTATIN CALCIUM 20 MG PO TABS
20.0000 mg | ORAL_TABLET | Freq: Every evening | ORAL | Status: DC
  Administered 2017-11-15 – 2017-11-20 (×6): 20 mg via ORAL
  Filled 2017-11-15 (×6): qty 1

## 2017-11-15 MED ORDER — PANTOPRAZOLE SODIUM 40 MG PO TBEC
40.0000 mg | DELAYED_RELEASE_TABLET | Freq: Every day | ORAL | Status: DC
  Administered 2017-11-16 – 2017-11-21 (×6): 40 mg via ORAL
  Filled 2017-11-15 (×6): qty 1

## 2017-11-15 MED ORDER — MAGNESIUM OXIDE 400 (241.3 MG) MG PO TABS
400.0000 mg | ORAL_TABLET | Freq: Two times a day (BID) | ORAL | Status: DC
  Administered 2017-11-15 – 2017-11-21 (×12): 400 mg via ORAL
  Filled 2017-11-15 (×12): qty 1

## 2017-11-15 MED ORDER — METHYLPREDNISOLONE SODIUM SUCC 125 MG IJ SOLR
60.0000 mg | INTRAMUSCULAR | Status: DC
  Administered 2017-11-15 – 2017-11-18 (×4): 60 mg via INTRAVENOUS
  Filled 2017-11-15 (×4): qty 2

## 2017-11-15 MED ORDER — DOCUSATE SODIUM 100 MG PO CAPS
100.0000 mg | ORAL_CAPSULE | Freq: Two times a day (BID) | ORAL | Status: DC | PRN
Start: 2017-11-15 — End: 2017-11-21
  Administered 2017-11-20 – 2017-11-21 (×2): 100 mg via ORAL
  Filled 2017-11-15 (×2): qty 1

## 2017-11-15 MED ORDER — ACETAMINOPHEN 325 MG PO TABS: 650.0000 mg | ORAL_TABLET | Freq: Four times a day (QID) | ORAL | Status: DC | PRN

## 2017-11-15 MED ORDER — VITAMIN D 1000 UNITS PO TABS
1000.0000 [IU] | ORAL_TABLET | Freq: Every day | ORAL | Status: DC
  Administered 2017-11-16 – 2017-11-21 (×6): 1000 [IU] via ORAL
  Filled 2017-11-15 (×6): qty 1

## 2017-11-15 MED ORDER — LOSARTAN POTASSIUM 50 MG PO TABS
100.0000 mg | ORAL_TABLET | Freq: Every day | ORAL | Status: DC
  Administered 2017-11-16: 10:00:00 100 mg via ORAL
  Filled 2017-11-15: qty 2

## 2017-11-15 MED ORDER — FLUTICASONE PROPIONATE 50 MCG/ACT NA SUSP
2.0000 | Freq: Every day | NASAL | Status: DC
  Administered 2017-11-16 – 2017-11-21 (×6): 2 via NASAL
  Filled 2017-11-15: qty 16

## 2017-11-15 MED ORDER — ALPRAZOLAM 0.25 MG PO TABS
0.2500 mg | ORAL_TABLET | Freq: Three times a day (TID) | ORAL | Status: DC | PRN
  Administered 2017-11-15 – 2017-11-19 (×5): 0.25 mg via ORAL
  Filled 2017-11-15 (×5): qty 1

## 2017-11-15 MED ORDER — LEVOFLOXACIN IN D5W 750 MG/150ML IV SOLN
750.0000 mg | INTRAVENOUS | Status: DC
  Administered 2017-11-15 – 2017-11-19 (×3): 750 mg via INTRAVENOUS
  Filled 2017-11-15 (×3): qty 150

## 2017-11-15 MED ORDER — ASPIRIN EC 81 MG PO TBEC
81.0000 mg | DELAYED_RELEASE_TABLET | Freq: Every day | ORAL | Status: DC
  Administered 2017-11-16 – 2017-11-19 (×4): 81 mg via ORAL
  Filled 2017-11-15 (×4): qty 1

## 2017-11-15 MED ORDER — FLUOXETINE HCL 20 MG PO CAPS
20.0000 mg | ORAL_CAPSULE | Freq: Two times a day (BID) | ORAL | Status: DC
  Administered 2017-11-15 – 2017-11-21 (×12): 20 mg via ORAL
  Filled 2017-11-15 (×13): qty 1

## 2017-11-15 MED ORDER — IPRATROPIUM-ALBUTEROL 0.5-2.5 (3) MG/3ML IN SOLN
3.0000 mL | RESPIRATORY_TRACT | Status: DC
  Administered 2017-11-15 (×2): 3 mL via RESPIRATORY_TRACT
  Filled 2017-11-15 (×2): qty 3

## 2017-11-15 MED ORDER — HYDROCODONE-ACETAMINOPHEN 5-325 MG PO TABS
1.0000 | ORAL_TABLET | Freq: Four times a day (QID) | ORAL | Status: DC | PRN
  Administered 2017-11-15: 1 via ORAL
  Filled 2017-11-15: qty 1

## 2017-11-15 MED ORDER — FERROUS SULFATE 325 (65 FE) MG PO TABS
325.0000 mg | ORAL_TABLET | Freq: Every day | ORAL | Status: DC
  Administered 2017-11-16 – 2017-11-21 (×6): 325 mg via ORAL
  Filled 2017-11-15 (×6): qty 1

## 2017-11-15 NOTE — Consult Note (Signed)
Pharmacy Antibiotic Note  Janice Perkins is a 84 y.o. female admitted on 11/15/2017 with pneumonia.  Pharmacy has been consulted for levofloxacin dosing.  Plan: Start levofloxacin 750mg  IV every 48 hours based on current CrCl of 30.83ml/min.   Height: 5\' 1"  (154.9 cm) Weight: 158 lb 1.1 oz (71.7 kg) IBW/kg (Calculated) : 47.8  Temp (24hrs), Avg:97.7 F (36.5 C), Min:97.7 F (36.5 C), Max:97.7 F (36.5 C)  Recent Labs  Lab 11/15/17 1908  WBC 27.3*  CREATININE 1.27*    Estimated Creatinine Clearance: 30.4 mL/min (A) (by C-G formula based on SCr of 1.27 mg/dL (H)).    Allergies  Allergen Reactions  . Penicillins Hives    .Has patient had a PCN reaction causing immediate rash, facial/tongue/throat swelling, SOB or lightheadedness with hypotension: Unknown Has patient had a PCN reaction causing severe rash involving mucus membranes or skin necrosis: Unknown Has patient had a PCN reaction that required hospitalization: Unknown Has patient had a PCN reaction occurring within the last 10 years: Unknown If all of the above answers are "NO", then may proceed with Cephalosporin use.   . Captopril Other (See Comments)  . Enalapril Maleate     Other reaction(s): Unknown  . Seldane  [Terfenadine] Other (See Comments)  . Tape Itching  . Augmentin [Amoxicillin-Pot Clavulanate] Rash  . Biaxin [Clarithromycin] Rash, Other (See Comments) and Hives    Other reaction(s): Unknown    Antimicrobials this admission: 4/1 levofloxacin  >>   Thank you for allowing pharmacy to be a part of this patient's care.  Pernell Dupre, PharmD, BCPS Clinical Pharmacist 11/15/2017 7:41 PM

## 2017-11-15 NOTE — H&P (Signed)
[facility] [facility] [place] NAME: Janice Perkins    MR#:  [id number]  DATE OF BIRTH:  Jul 08, 1933  DATE OF ADMISSION:  11/15/2017  PRIMARY CARE PHYSICIAN: [name], MD   REQUESTING/REFERRING PHYSICIAN: Hande  CHIEF COMPLAINT:  No chief complaint on file.   HISTORY OF PRESENT ILLNESS: Janice Perkins  is a 84 y.o. female with a known history of asthma, atrial fibrillation, breast cancer, COPD, hypertension, pneumonia and pleural effusion in past, started having worsening shortness of breath for last 1 week, went to her pulmonologist, who prescribed oral steroids and doxycycline. She was feeling very jittery with steroids, continued to have cough and shortness of breath so went to her primary care physician today.on chest x-ray noted to have pneumonia and because of her failure of outpatient treatment he sent her for direct admission.  PAST MEDICAL HISTORY:   Past Medical History:  Diagnosis Date  . Asthma   . Atrial fibrillation ([place])   . Breast cancer ([place]) 2011  . Bronchitis 04/2015  . Cancer [facility] 2011   breast  . COPD (chronic obstructive pulmonary disease) ([place])   . Hypertension   . Shingles    10/2015    PAST SURGICAL HISTORY:  Past Surgical History:  Procedure Laterality Date  . BREAST EXCISIONAL BIOPSY Right 11/29/13   two areas  . BREAST EXCISIONAL BIOPSY Right 10/30/2009   lumpectomy - radiation    SOCIAL HISTORY:  Social History   Tobacco Use  . Smoking status: Never Smoker  . Smokeless tobacco: Never Used  Substance Use Topics  . Alcohol use: No    Alcohol/week: 0.0 oz    FAMILY HISTORY:  Family History  Problem Relation Age of Onset  . Hypertension Mother   . Heart disease Mother   . Cancer Mother   . Stroke Father   . Hypertension Father   . Breast cancer Sister     DRUG ALLERGIES:  Allergies  Allergen Reactions  . Penicillins Hives    .Has patient had a PCN reaction causing immediate rash,  facial/tongue/throat swelling, SOB or lightheadedness with hypotension: Unknown Has patient had a PCN reaction causing severe rash involving mucus membranes or skin necrosis: Unknown Has patient had a PCN reaction that required hospitalization: Unknown Has patient had a PCN reaction occurring within the last 10 years: Unknown If all of the above answers are "NO", then may proceed with Cephalosporin use.   . Captopril Other (See Comments)  . Enalapril Maleate     Other reaction(s): Unknown  . Seldane  [Terfenadine] Other (See Comments)  . Tape Itching  . Augmentin [Amoxicillin-Pot Clavulanate] Rash  . Biaxin [Clarithromycin] Rash, Other (See Comments) and Hives    Other reaction(s): Unknown    REVIEW OF SYSTEMS:   CONSTITUTIONAL: No fever, fatigue or weakness.  EYES: No blurred or double vision.  EARS, NOSE, AND THROAT: No tinnitus or ear pain.  RESPIRATORY: have cough, shortness of breath,no wheezing , had some hemoptysis.  CARDIOVASCULAR: No chest pain, orthopnea, edema.  GASTROINTESTINAL: No nausea, vomiting, diarrhea or abdominal pain.  GENITOURINARY: No dysuria, hematuria.  ENDOCRINE: No polyuria, nocturia,  HEMATOLOGY: No anemia, easy bruising or bleeding. SKIN: No rash or lesion. MUSCULOSKELETAL: No joint pain or arthritis.   NEUROLOGIC: No tingling, numbness, weakness.  PSYCHIATRY: No anxiety or depression.   MEDICATIONS AT HOME:  Prior to Admission medications   Medication Sig Start Date End Date Taking? Authorizing Provider  acetaminophen (TYLENOL) 325 MG tablet Take 2  tablets (650 mg total) by mouth every 6 (six) hours as needed for mild pain (or Fever >/= 101). 12/22/16   Gouru, Illene Silver, MD  albuterol (PROVENTIL HFA;VENTOLIN HFA) 108 (90 Base) MCG/ACT inhaler Inhale 1-2 puffs into the lungs every 6 (six) hours as needed for wheezing or shortness of breath.    [provider]  ALPRAZolam Duanne Moron) 0.25 MG tablet Take 1 tablet (0.25 mg total) by mouth 3 (three) times  daily as needed for anxiety. 12/22/16   Nicholes Mango, MD  aspirin 81 MG tablet Take 81 mg by mouth daily.     [provider]  atorvastatin (LIPITOR) 20 MG tablet Take 20 mg by mouth daily.    [provider]  cholecalciferol (VITAMIN D) 1000 units tablet Take 1,000 Units by mouth daily.    [provider]  dicyclomine (BENTYL) 10 MG capsule Take 10 mg by mouth 2 (two) times daily. 05/07/17 05/07/18  [provider]  docusate sodium (COLACE) 100 MG capsule Take 1 capsule (100 mg total) by mouth daily. Patient not taking: Reported on 05/15/2017 12/22/16   Nicholes Mango, MD  esomeprazole (NEXIUM) 40 MG capsule Take 40 mg by mouth daily at 12 noon.    [provider]  ferrous sulfate 325 (65 FE) MG tablet Take 325 mg by mouth daily with breakfast.    [provider]  FLUoxetine (PROZAC) 20 MG capsule Take 20 mg by mouth 2 (two) times daily.    [provider]  fluticasone (FLONASE) 50 MCG/ACT nasal spray Place 2 sprays into both nostrils daily.     [provider]  Fluticasone-Salmeterol (ADVAIR) 250-50 MCG/DOSE AEPB Inhale 1 puff into the lungs 2 (two) times daily.    [provider]  hydrALAZINE (APRESOLINE) 25 MG tablet Take 1 tablet (25 mg total) by mouth every 6 (six) hours. Patient not taking: Reported on 05/15/2017 12/22/16   Nicholes Mango, MD  HYDROcodone-acetaminophen (NORCO/VICODIN) 5-325 MG tablet Take 1 tablet by mouth as needed.    [provider]  levothyroxine (SYNTHROID, LEVOTHROID) 75 MCG tablet Take 1 tablet (75 mcg total) by mouth daily before breakfast. 12/23/16   Gouru, Illene Silver, MD  losartan (COZAAR) 100 MG tablet Take 1 tablet (100 mg total) by mouth daily. 12/23/16   Nicholes Mango, MD  magnesium oxide (MAG-OX) 400 MG tablet Take 400 mg by mouth 2 (two) times daily.    [provider]  metoprolol succinate (TOPROL-XL) 100 MG 24 hr tablet Take 1 tablet (100 mg total) by mouth 2 (two) times daily with a  meal. Take with or immediately following a meal. Patient taking differently: Take 50 mg by mouth 2 (two) times daily with a meal. Take with or immediately following a meal. 12/22/16   Gouru, Aruna, MD  ondansetron (ZOFRAN ODT) 4 MG disintegrating tablet Take 1 tablet (4 mg total) by mouth every 8 (eight) hours as needed for nausea or vomiting. 01/21/17   Vaughan Basta, MD  senna (SENOKOT) 8.6 MG TABS tablet Take 1 tablet by mouth daily as needed for mild constipation.    [provider]  torsemide (DEMADEX) 5 MG tablet Take 5 mg by mouth daily.    [provider]      PHYSICAL EXAMINATION:   VITAL SIGNS: Blood pressure 123/67, pulse 96, temperature 97.9 F (36.6 C), temperature source Oral, resp. rate 18, height 5\' 1"  (1.549 m), weight 71.7 kg (158 lb 1.1 oz), SpO2 98 %.  GENERAL:  84 y.o.-year-old patient lying in the  bed with no acute distress.  EYES: Pupils equal, round, reactive to light and accommodation. No scleral icterus. Extraocular muscles intact.  HEENT: Head atraumatic, normocephalic. Oropharynx and nasopharynx clear.  NECK:  Supple, no jugular venous distention. No thyroid enlargement, no tenderness.  LUNGS: Normal breath sounds bilaterally, no wheezing, some crepitation. No use of accessory muscles of respiration.  CARDIOVASCULAR: S1, S2 normal. No murmurs, rubs, or gallops.  ABDOMEN: Soft, nontender, nondistended. Bowel sounds present. No organomegaly or mass.  EXTREMITIES: No pedal edema, cyanosis, or clubbing.  NEUROLOGIC: Cranial nerves II through XII are intact. Muscle strength 5/5 in all extremities. Sensation intact. Gait not checked.  PSYCHIATRIC: The patient is alert and oriented x 3.  SKIN: No obvious rash, lesion, or ulcer.   LABORATORY PANEL:   CBC Recent Labs  Lab 11/15/17 1908  WBC 27.3*  HGB 12.6  HCT 36.6  PLT 155  MCV 87.4  MCH 30.0  MCHC 34.4  RDW 16.0*    ------------------------------------------------------------------------------------------------------------------  Chemistries  Recent Labs  Lab 11/15/17 1908  CREATININE 1.27*   ------------------------------------------------------------------------------------------------------------------ estimated creatinine clearance is 30.4 mL/min (A) (by C-G formula based on SCr of 1.27 mg/dL (H)). ------------------------------------------------------------------------------------------------------------------ No results for input(s): TSH, T4TOTAL, T3FREE, THYROIDAB in the last 72 hours.  Invalid input(s): FREET3   Coagulation profile No results for input(s): INR, PROTIME in the last 168 hours. ------------------------------------------------------------------------------------------------------------------- No results for input(s): DDIMER in the last 72 hours. -------------------------------------------------------------------------------------------------------------------  Cardiac Enzymes No results for input(s): CKMB, TROPONINI, MYOGLOBIN in the last 168 hours.  Invalid input(s): CK ------------------------------------------------------------------------------------------------------------------ Invalid input(s): POCBNP  ---------------------------------------------------------------------------------------------------------------  Urinalysis    Component Value Date/Time   COLORURINE YELLOW (A) 03/07/2017 1123   APPEARANCEUR CLEAR (A) 03/07/2017 1123   LABSPEC 1.012 03/07/2017 1123   PHURINE 6.0 03/07/2017 1123   GLUCOSEU NEGATIVE 03/07/2017 1123   HGBUR NEGATIVE 03/07/2017 1123   BILIRUBINUR NEGATIVE 03/07/2017 1123   KETONESUR NEGATIVE 03/07/2017 1123   PROTEINUR NEGATIVE 03/07/2017 1123   NITRITE NEGATIVE 03/07/2017 1123   LEUKOCYTESUR TRACE (A) 03/07/2017 1123     RADIOLOGY: Dg Chest 2 View  Result Date: 11/15/2017 CLINICAL DATA:  Evaluate for pneumonia. EXAM:  CHEST - 2 VIEW COMPARISON:  Chest x-ray dated May 17, 2017. FINDINGS: Heart size remains borderline enlarged. Normal pulmonary vascularity. Patchy opacities in the left upper lobe and lingula. The right lung is clear. No pleural effusion or pneumothorax. No acute osseous abnormality. IMPRESSION: Patchy opacities in left upper lobe and lingula, concerning for pneumonia. Followup PA and lateral chest X-ray is recommended in 3-4 weeks following trial of antibiotic therapy to ensure resolution and exclude underlying malignancy. Electronically Signed   By: Titus Dubin M.D.   On: 11/15/2017 19:12    EKG: Orders placed or performed during the hospital encounter of 05/15/17  . ED EKG within 10 minutes  . ED EKG within 10 minutes  . ED EKG  . ED EKG  . EKG    IMPRESSION AND PLAN:  * pneumonia    Failed outpatient treatment.    Levaquin, follow up x-ray or CT scan after resolution as she had pneumonia an effusion in the past.  * COPD exacerbation   IV steroid, inhaled bronchodilator.  * hypertension   Continue losartan.  * hyperlipidemia   Continue atorvastatin.  All the records are reviewed and case discussed with ED provider. Management plans discussed with the patient, family and they are in agreement.  CODE STATUS:full code    Code Status Orders  (From admission, onward)  Start     Ordered   11/15/17 1830  Full code  Continuous     11/15/17 1829    Code Status History    Date Active Date Inactive Code Status Order ID Comments User Context   05/16/2017 0041 05/17/2017 1824 Full Code [id number]  Quintella Baton, MD ED   03/07/2017 1446 03/09/2017 1509 Full Code [id number]  Idelle Crouch, MD Inpatient   01/19/2017 0837 01/21/2017 1415 Full Code [id number]  Harrie Foreman, MD Inpatient   12/18/2016 0718 12/22/2016 1947 Full Code [id number]  Harrie Foreman, MD Inpatient    Advance Directive Documentation     Most Recent Value  Type of Advance Directive  Living will   Pre-existing out of facility DNR order (yellow form or pink MOST form)  -  "MOST" Form in Place?  -     Husband in the room during my visit.  TOTAL TIME TAKING CARE OF THIS PATIENT: 50 minutes.    Vaughan Basta M.D on 11/15/2017   Between 7am to 6pm - Pager - [phone]  After 6pm go to [email or web address] - password [email or web address] [place]  Office  [phone]  CC: Primary care physician; [name], MD   Note: This dictation was prepared with Dragon dictation along with smaller phrase technology. Any transcriptional errors that result from this process are unintentional.

## 2017-11-16 LAB — COMPREHENSIVE METABOLIC PANEL
ALBUMIN: 3 g/dL — AB (ref 3.5–5.0)
ALK PHOS: 57 U/L (ref 38–126)
ALT: 41 U/L (ref 14–54)
AST: 47 U/L — AB (ref 15–41)
Anion gap: 9 (ref 5–15)
BILIRUBIN TOTAL: 1.2 mg/dL (ref 0.3–1.2)
BUN: 32 mg/dL — AB (ref 6–20)
CALCIUM: 8.8 mg/dL — AB (ref 8.9–10.3)
CO2: 26 mmol/L (ref 22–32)
CREATININE: 1.17 mg/dL — AB (ref 0.44–1.00)
Chloride: 90 mmol/L — ABNORMAL LOW (ref 101–111)
GFR calc Af Amer: 49 mL/min — ABNORMAL LOW (ref >60)
GFR calc non Af Amer: 42 mL/min — ABNORMAL LOW (ref >60)
GLUCOSE: 169 mg/dL — AB (ref 65–99)
Potassium: 4.9 mmol/L (ref 3.5–5.1)
Sodium: 125 mmol/L — ABNORMAL LOW (ref 135–145)
TOTAL PROTEIN: 6.8 g/dL (ref 6.5–8.1)

## 2017-11-16 LAB — BASIC METABOLIC PANEL
Anion gap: 10 (ref 5–15)
Anion gap: 10 (ref 5–15)
BUN: 32 mg/dL — AB (ref 6–20)
BUN: 32 mg/dL — AB (ref 6–20)
CALCIUM: 9 mg/dL (ref 8.9–10.3)
CALCIUM: 9.1 mg/dL (ref 8.9–10.3)
CO2: 24 mmol/L (ref 22–32)
CO2: 25 mmol/L (ref 22–32)
Chloride: 89 mmol/L — ABNORMAL LOW (ref 101–111)
Chloride: 90 mmol/L — ABNORMAL LOW (ref 101–111)
Creatinine, Ser: 0.93 mg/dL (ref 0.44–1.00)
Creatinine, Ser: 1 mg/dL (ref 0.44–1.00)
GFR calc Af Amer: >60 mL/min (ref >60)
GFR calc Af Amer: 59 mL/min — ABNORMAL LOW (ref >60)
GFR, EST NON AFRICAN AMERICAN: 55 mL/min — AB (ref >60)
GFR, EST NON AFRICAN AMERICAN: 51 mL/min — AB (ref >60)
GLUCOSE: 145 mg/dL — AB (ref 65–99)
GLUCOSE: 166 mg/dL — AB (ref 65–99)
POTASSIUM: 4.2 mmol/L (ref 3.5–5.1)
Potassium: 4.2 mmol/L (ref 3.5–5.1)
Sodium: 123 mmol/L — ABNORMAL LOW (ref 135–145)
Sodium: 125 mmol/L — ABNORMAL LOW (ref 135–145)

## 2017-11-16 LAB — CBC
HCT: 36.9 % (ref 35.0–47.0)
Hemoglobin: 12.4 g/dL (ref 12.0–16.0)
MCH: 29.4 pg (ref 26.0–34.0)
MCHC: 33.6 g/dL (ref 32.0–36.0)
MCV: 87.5 fL (ref 80.0–100.0)
Platelets: 159 10*3/uL (ref 150–440)
RBC: 4.22 MIL/uL (ref 3.80–5.20)
RDW: 15.7 % — ABNORMAL HIGH (ref 11.5–14.5)
WBC: 23.7 10*3/uL — ABNORMAL HIGH (ref 3.6–11.0)

## 2017-11-16 MED ORDER — IPRATROPIUM-ALBUTEROL 0.5-2.5 (3) MG/3ML IN SOLN
3.0000 mL | Freq: Four times a day (QID) | RESPIRATORY_TRACT | Status: DC
  Administered 2017-11-16 – 2017-11-19 (×14): 3 mL via RESPIRATORY_TRACT
  Filled 2017-11-16 (×14): qty 3

## 2017-11-16 MED ORDER — HYDROCODONE-ACETAMINOPHEN 5-325 MG PO TABS
1.0000 | ORAL_TABLET | Freq: Four times a day (QID) | ORAL | Status: DC | PRN
  Administered 2017-11-16 – 2017-11-21 (×13): 1 via ORAL
  Filled 2017-11-16 (×14): qty 1

## 2017-11-16 MED ORDER — IPRATROPIUM-ALBUTEROL 0.5-2.5 (3) MG/3ML IN SOLN
3.0000 mL | RESPIRATORY_TRACT | Status: DC | PRN
  Administered 2017-11-20 (×2): 3 mL via RESPIRATORY_TRACT
  Filled 2017-11-16 (×2): qty 3

## 2017-11-16 MED ORDER — SODIUM CHLORIDE 0.9 % IV SOLN
INTRAVENOUS | Status: DC
  Administered 2017-11-16 – 2017-11-17 (×2): via INTRAVENOUS

## 2017-11-16 MED ORDER — BENZONATATE 100 MG PO CAPS
100.0000 mg | ORAL_CAPSULE | Freq: Three times a day (TID) | ORAL | Status: DC
  Administered 2017-11-16 – 2017-11-21 (×15): 100 mg via ORAL
  Filled 2017-11-16 (×15): qty 1

## 2017-11-16 NOTE — Progress Notes (Signed)
[place] at [place] NAME: Beyza Bellino    MR#:  [id number]  DATE OF BIRTH:  11-02-1932  SUBJECTIVE: Admitted for COPD exacerbation, laryngitis, patient husband states she lost voice for last to 3 days and had to really poor p.o. intake for 3 days, she just started to get her voice back.  Patient still has very dry cough and requesting cough drops.  Sodium is 125.  CHIEF COMPLAINT:  No chief complaint on file.   REVIEW OF SYSTEMS:   ROS CONSTITUTIONAL: No fever, .  EYES: No blurred or double vision.  EARS, NOSE, AND THROAT: No tinnitus or ear pain.  RESPIRATORY: Cough, shortness of breath,  cARDIOVASCULAR: No chest pain, orthopnea, edema.  GASTROINTESTINAL: No nausea, vomiting, diarrhea or abdominal pain.  GENITOURINARY: No dysuria, hematuria.  ENDOCRINE: No polyuria, nocturia,  HEMATOLOGY: No anemia, easy bruising or bleeding SKIN: No rash or lesion. MUSCULOSKELETAL: No joint pain or arthritis.   NEUROLOGIC: No tingling, numbness, weakness.  PSYCHIATRY: No anxiety or depression.   DRUG ALLERGIES:   Allergies  Allergen Reactions  . Penicillins Hives    .Has patient had a PCN reaction causing immediate rash, facial/tongue/throat swelling, SOB or lightheadedness with hypotension: Unknown Has patient had a PCN reaction causing severe rash involving mucus membranes or skin necrosis: Unknown Has patient had a PCN reaction that required hospitalization: Unknown Has patient had a PCN reaction occurring within the last 10 years: Unknown If all of the above answers are "NO", then may proceed with Cephalosporin use.   . Captopril Other (See Comments)  . Enalapril Maleate     Other reaction(s): Unknown  . Seldane  [Terfenadine] Other (See Comments)  . Tape Itching  . Augmentin [Amoxicillin-Pot Clavulanate] Rash  . Biaxin [Clarithromycin] Rash, Other (See Comments) and Hives    Other reaction(s): Unknown    VITALS:  Blood pressure  120/88, pulse 78, temperature (!) 97.4 F (36.3 C), temperature source Oral, resp. rate 18, height 5\' 1"  (1.549 m), weight 71.7 kg (158 lb 1.1 oz), SpO2 99 %.  PHYSICAL EXAMINATION:  GENERAL:  84 y.o.-year-old patient lying in the bed with no acute distress.  EYES: Pupils equal, round, reactive to light and accommodation. No scleral icterus. Extraocular muscles intact.  HEENT: Head atraumatic, normocephalic. Oropharynx and nasopharynx clear.  NECK:  Supple, no jugular venous distention. No thyroid enlargement, no tenderness.  LUNGS: Overall diminished breath sound bilaterally but very faint wheezing at the bases,no rales,rhonchi or crepitation. No use of accessory muscles of respiration.  CARDIOVASCULAR: S1, S2 normal. No murmurs, rubs, or gallops.  ABDOMEN: Soft, nontender, nondistended. Bowel sounds present. No organomegaly or mass.  EXTREMITIES: No pedal edema, cyanosis, or clubbing.  NEUROLOGIC: Cranial nerves II through XII are intact. Muscle strength 5/5 in all extremities. Sensation intact. Gait not checked.  PSYCHIATRIC: The patient is alert and oriented x 3.  SKIN: No obvious rash, lesion, or ulcer.    LABORATORY PANEL:   CBC Recent Labs  Lab 11/16/17 0636  WBC 23.7*  HGB 12.4  HCT 36.9  PLT 159   ------------------------------------------------------------------------------------------------------------------  Chemistries  Recent Labs  Lab 11/16/17 0636  NA 125*  K 4.9  CL 90*  CO2 26  GLUCOSE 169*  BUN 32*  CREATININE 1.17*  CALCIUM 8.8*  AST 47*  ALT 41  ALKPHOS 57  BILITOT 1.2   ------------------------------------------------------------------------------------------------------------------  Cardiac Enzymes No results for input(s): TROPONINI in the last 168 hours. ------------------------------------------------------------------------------------------------------------------  RADIOLOGY:  Dg Chest  2 View  Result Date: 11/15/2017 CLINICAL DATA:   Evaluate for pneumonia. EXAM: CHEST - 2 VIEW COMPARISON:  Chest x-ray dated May 17, 2017. FINDINGS: Heart size remains borderline enlarged. Normal pulmonary vascularity. Patchy opacities in the left upper lobe and lingula. The right lung is clear. No pleural effusion or pneumothorax. No acute osseous abnormality. IMPRESSION: Patchy opacities in left upper lobe and lingula, concerning for pneumonia. Followup PA and lateral chest X-ray is recommended in 3-4 weeks following trial of antibiotic therapy to ensure resolution and exclude underlying malignancy. Electronically Signed   By: Titus Dubin M.D.   On: 11/15/2017 19:12    EKG:   Orders placed or performed during the hospital encounter of 05/15/17  . ED EKG within 10 minutes  . ED EKG within 10 minutes  . ED EKG  . ED EKG  . EKG    ASSESSMENT AND PLAN:   1 .community-acquired pneumonia, failed outpatient therapy, continue Levaquin, bronchodilator, IV steroids, add Tussionex, Patient O2 sat 92% on room air, patient has nocturnal oxygen, according to husband recently for the last to 3 days patient needed oxygen during daytime also.. #2 COPD: Currently well controlled, bronchodilators, large.  Pleural fluid cytology is negative for malignancy.  No pleural effusion by chest x-ray this time. 3.  History of breast cancer, history of pleural effusion, status post thoracocentesis recently in 2018, get CT of the chest tomorrow. 4.  Acute hyponatremia secondary to dehydration, start gentle hydration and check BMP every 8 hours to prevent rapid correction.     Essential hypertension: Controlled  #7. depression: Continue Prozac, Xanax                                         Deconditioning: Physical therapy consult.     Discussed with husband All the records are reviewed and case discussed with Care Management/Social Workerr. Management plans discussed with the patient, family and they are in agreement.  CODE STATUS: full  TOTAL TIME TAKING  CARE OF THIS PATIENT: 35 minutes.   POSSIBLE D/C IN 1-2DAYS, DEPENDING ON CLINICAL CONDITION.   Epifanio Lesches M.D on 11/16/2017 at 12:12 PM  Between 7am to 6pm - Pager - [phone]  After 6pm go to [email or web address] - password [email or web address] [facility] [facility]  Office  [phone]  CC: Primary care physician; [name], MD   Note: This dictation was prepared with Dragon dictation along with smaller phrase technology. Any transcriptional errors that result from this process are unintentional.

## 2017-11-16 NOTE — Evaluation (Signed)
Physical Therapy Evaluation Patient Details Name: Janice Perkins MRN: [id number] DOB: 1933-05-03 Today's Date: 11/16/2017   History of Present Illness  Pt is a 84 y/o F who presented to hospital w/ SOB and cough 11/15/17. Pt admitted 11/15/17 w/ diagnosis of pneumonia. PMHx includes: asthma, afib, breast cancer, COPD, HTN, pneumonia, pleural effusion, shingles (2017), and back surgery (10/2016).    Clinical Impression  Pt demonstrated bed mobility (supervision), transfers and ambulation RW CGA (see mobility section for details). Pt demonstrated tremors, Pt reported this is her baseline and that she takes a medication for nerves that she had not taken yet this am; RN notified. Pt reported fatigue at 40 ft ambulation and requested to return to room; Pt demonstrated good stability with toileting upon return to room and then repositioned in recliner. Pt reports that she lives at home w/ her husband and two grown children and that someone is available at home with her at all times post hospitalization.  Pt would benefit from skilled PT to progress strength and activity tolerance. Recommend HHPT w/ 24/7 supervision/assistance upon discharge from acute hospitalization.   Follow Up Recommendations Home health PT;Supervision/Assistance - 24 hour    Equipment Recommendations  Rolling walker with 5" wheels    Recommendations for Other Services       Precautions / Restrictions Precautions Precautions: Fall Restrictions Weight Bearing Restrictions: No      Mobility  Bed Mobility Overal bed mobility: Modified Independent;Needs Assistance Bed Mobility: Supine to Sit     Supine to sit: Supervision;HOB elevated     General bed mobility comments: required a little extra time and effort  Transfers Overall transfer level: Needs assistance Equipment used: Rolling walker (2 wheeled) Transfers: Sit to/from Stand Sit to Stand: Min guard         General transfer comment: Pt reports feeling weaker than  normal, CGA for safety. Pt demonstrated mild tremors and reported that she takes a medication for nerves that reduces tremors and she had not taken it yet this am; RN notified.  Ambulation/Gait Ambulation/Gait assistance: Min guard Ambulation Distance (Feet): 60 Feet Assistive device: Rolling walker (2 wheeled) Gait Pattern/deviations: Step-through pattern;Decreased step length - right;Decreased step length - left Gait velocity: decreased   General Gait Details: Pt reported fatigue at 40 ft and requested to ambulate back to room; Pt ambulated back to room and stated need to use BSC; Pt demonstrated good stability w/ toileting; Pt then repositioned in recliner.  Stairs            Wheelchair Mobility    Modified Rankin (Stroke Patients Only)       Balance Overall balance assessment: Needs assistance Sitting-balance support: No upper extremity supported;Feet supported Sitting balance-Leahy Scale: Good Sitting balance - Comments: Pt demonstrated good weight shifting when managing clothing reaching within BOS at EOB     Standing balance-Leahy Scale: Poor Standing balance comment: required RW; CGA for safety                             Pertinent Vitals/Pain      Home Living Family/patient expects to be discharged to:: Private residence Living Arrangements: Spouse/significant other;Children(2 adult children live at home) Available Help at Discharge: Family;Available PRN/intermittently Type of Home: House Home Access: Level entry     Home Layout: One level Home Equipment: Walker - 2 wheels;Tub bench;Bedside commode;Grab bars - tub/shower      Prior Function Level of Independence: Independent  Comments: Pt reports being independent w/ all ADLs and driving prior to hospitalization     Hand Dominance   Dominant Hand: Right    Extremity/Trunk Assessment   Upper Extremity Assessment Upper Extremity Assessment: Generalized weakness;RUE  deficits/detail;LUE deficits/detail RUE Deficits / Details: Pt AROM WFL and strength at least 3/5 shoulder flexion, elbow flexion/extension, pronation/supination LUE Deficits / Details: Pt AROM WFL and strength at least 3/5 shoulder flexion, elbow flexion/extension, pronation/supination    Lower Extremity Assessment Lower Extremity Assessment: Generalized weakness;RLE deficits/detail;LLE deficits/detail RLE Deficits / Details: Pt AROM hip flexion limited (Pt reports that lifting her knee any higher causes pain in her back); knee flexion/extension AROM WFL; hip strength at least 3/5 demonstrated by standing marching at EOB LLE Deficits / Details: Pt AROM hip flexion limited (Pt reports that lifting her knee any higher causes pain in her back); knee flexion/extension AROM WFL; hip strength at least 3/5 demonstrated by standing marching at EOB       Communication   Communication: No difficulties  Cognition Arousal/Alertness: Awake/alert Behavior During Therapy: WFL for tasks assessed/performed Overall Cognitive Status: Within Functional Limits for tasks assessed                                 General Comments: Pt A&O x 4      General Comments  Pt agreeable to session    Exercises     Assessment/Plan    PT Assessment Patient needs continued PT services  PT Problem List Decreased strength;Decreased range of motion;Decreased activity tolerance;Decreased balance;Decreased mobility;Decreased coordination;Decreased knowledge of use of DME;Decreased safety awareness;Decreased knowledge of precautions;Cardiopulmonary status limiting activity;Pain       PT Treatment Interventions DME instruction;Balance training;Functional mobility training;Therapeutic activities;Therapeutic exercise;Patient/family education;Gait training    PT Goals (Current goals can be found in the Care Plan section)  Acute Rehab PT Goals Patient Stated Goal: I want to get stronger PT Goal Formulation:  With patient Time For Goal Achievement: 11/30/17 Potential to Achieve Goals: Good    Frequency Min 2X/week   Barriers to discharge        Co-evaluation               AM-PAC PT "6 Clicks" Daily Activity  Outcome Measure Difficulty turning over in bed (including adjusting bedclothes, sheets and blankets)?: A Little Difficulty moving from lying on back to sitting on the side of the bed? : A Little Difficulty sitting down on and standing up from a chair with arms (e.g., wheelchair, bedside commode, etc,.)?: Unable Help needed moving to and from a bed to chair (including a wheelchair)?: A Little Help needed walking in hospital room?: A Little Help needed climbing 3-5 steps with a railing? : A Lot 6 Click Score: 15    End of Session Equipment Utilized During Treatment: Gait belt Activity Tolerance: Patient limited by fatigue;Patient limited by pain Patient left: in chair;with call bell/phone within reach;with chair alarm set(B heels elevated by pillow) Nurse Communication: Mobility status;Other (comment)(RN notified back pain 6/10) PT Visit Diagnosis: Unsteadiness on feet (R26.81);Muscle weakness (generalized) (M62.81);Difficulty in walking, not elsewhere classified (R26.2);Other abnormalities of gait and mobility (R26.89);Pain Pain - Right/Left: (back) Pain - part of body: (back)    Time: 8676-1950 PT Time Calculation (min) (ACUTE ONLY): 25 min   Charges:   PT Evaluation $PT Eval Low Complexity: 1 Low PT Treatments $Therapeutic Activity: 8-22 mins   PT G Codes:  Leandrew Keech Mondrian-Pardue, SPT 11/16/2017, 1:33 PM

## 2017-11-17 ENCOUNTER — Inpatient Hospital Stay: Payer: Medicare Other

## 2017-11-17 LAB — BASIC METABOLIC PANEL
ANION GAP: 10 (ref 5–15)
ANION GAP: 9 (ref 5–15)
BUN: 30 mg/dL — AB (ref 6–20)
BUN: 28 mg/dL — ABNORMAL HIGH (ref 6–20)
CHLORIDE: 90 mmol/L — AB (ref 101–111)
CO2: 24 mmol/L (ref 22–32)
CO2: 26 mmol/L (ref 22–32)
Calcium: 9.4 mg/dL (ref 8.9–10.3)
Calcium: 9.1 mg/dL (ref 8.9–10.3)
Chloride: 92 mmol/L — ABNORMAL LOW (ref 101–111)
Creatinine, Ser: 0.93 mg/dL (ref 0.44–1.00)
Creatinine, Ser: 1.02 mg/dL — ABNORMAL HIGH (ref 0.44–1.00)
GFR, EST AFRICAN AMERICAN: 57 mL/min — AB (ref >60)
GFR, EST NON AFRICAN AMERICAN: 55 mL/min — AB (ref >60)
GFR, EST NON AFRICAN AMERICAN: 49 mL/min — AB (ref >60)
Glucose, Bld: 170 mg/dL — ABNORMAL HIGH (ref 65–99)
Glucose, Bld: 132 mg/dL — ABNORMAL HIGH (ref 65–99)
POTASSIUM: 4.5 mmol/L (ref 3.5–5.1)
POTASSIUM: 4.6 mmol/L (ref 3.5–5.1)
SODIUM: 124 mmol/L — AB (ref 135–145)
Sodium: 127 mmol/L — ABNORMAL LOW (ref 135–145)

## 2017-11-17 MED ORDER — DILTIAZEM HCL ER COATED BEADS 120 MG PO CP24
120.0000 mg | ORAL_CAPSULE | Freq: Every day | ORAL | Status: DC
  Administered 2017-11-17 – 2017-11-21 (×5): 120 mg via ORAL
  Filled 2017-11-17 (×5): qty 1

## 2017-11-17 MED ORDER — MENTHOL 3 MG MT LOZG
1.0000 | LOZENGE | OROMUCOSAL | Status: DC | PRN
  Administered 2017-11-18 (×2): 3 mg via ORAL
  Filled 2017-11-17 (×2): qty 9

## 2017-11-17 MED ORDER — HYDRALAZINE HCL 50 MG PO TABS
25.0000 mg | ORAL_TABLET | Freq: Three times a day (TID) | ORAL | Status: DC
  Administered 2017-11-17 – 2017-11-21 (×13): 25 mg via ORAL
  Filled 2017-11-17 (×13): qty 1

## 2017-11-17 NOTE — Progress Notes (Signed)
Janice Perkins at [place] NAME: Janice Perkins    MR#:  [id number]  DATE OF BIRTH:  30-Jul-1933  SUBJECTIVE: Patient still has hoarseness of the voice but much better than yesterday.  CHIEF COMPLAINT:  No chief complaint on file.   REVIEW OF SYSTEMS:   ROS CONSTITUTIONAL: No fever, .  EYES: No blurred or double vision.  EARS, NOSE, AND THROAT: No tinnitus or ear pain.  RESPIRATORY: Cough, shortness of breath,  cARDIOVASCULAR: No chest pain, orthopnea, edema.  GASTROINTESTINAL: No nausea, vomiting, diarrhea or abdominal pain.  GENITOURINARY: No dysuria, hematuria.  ENDOCRINE: No polyuria, nocturia,  HEMATOLOGY: No anemia, easy bruising or bleeding SKIN: No rash or lesion. MUSCULOSKELETAL: No joint pain or arthritis.   NEUROLOGIC: No tingling, numbness, weakness.  PSYCHIATRY: No anxiety or depression.   DRUG ALLERGIES:   Allergies  Allergen Reactions  . Penicillins Hives    .Has patient had a PCN reaction causing immediate rash, facial/tongue/throat swelling, SOB or lightheadedness with hypotension: Unknown Has patient had a PCN reaction causing severe rash involving mucus membranes or skin necrosis: Unknown Has patient had a PCN reaction that required hospitalization: Unknown Has patient had a PCN reaction occurring within the last 10 years: Unknown If all of the above answers are "NO", then may proceed with Cephalosporin use.   . Captopril Other (See Comments)  . Enalapril Maleate     Other reaction(s): Unknown  . Seldane  [Terfenadine] Other (See Comments)  . Tape Itching  . Augmentin [Amoxicillin-Pot Clavulanate] Rash  . Biaxin [Clarithromycin] Rash, Other (See Comments) and Hives    Other reaction(s): Unknown    VITALS:  Blood pressure (!) 122/110, pulse (!) 122, temperature 97.7 F (36.5 C), temperature source Oral, resp. rate 18, height 5\' 1"  (1.549 m), weight 71.7 kg (158 lb 1.1 oz), SpO2 93 %.  PHYSICAL EXAMINATION:   GENERAL:  84 y.o.-year-old patient lying in the bed with no acute distress.  EYES: Pupils equal, round, reactive to light and accommodation. No scleral icterus. Extraocular muscles intact.  HEENT: Head atraumatic, normocephalic. Oropharynx and nasopharynx clear.  NECK:  Supple, no jugular venous distention. No thyroid enlargement, no tenderness.  LUNGS: Overall diminished breath sound bilaterally but very faint wheezing at the bases,no rales,rhonchi or crepitation. No use of accessory muscles of respiration.  CARDIOVASCULAR: S1, S2 normal. No murmurs, rubs, or gallops.  ABDOMEN: Soft, nontender, nondistended. Bowel sounds present. No organomegaly or mass.  EXTREMITIES: No pedal edema, cyanosis, or clubbing.  NEUROLOGIC: Cranial nerves II through XII are intact. Muscle strength 5/5 in all extremities. Sensation intact. Gait not checked.  PSYCHIATRIC: The patient is alert and oriented x 3.  SKIN: No obvious rash, lesion, or ulcer.    LABORATORY PANEL:   CBC Recent Labs  Lab 11/16/17 0636  WBC 23.7*  HGB 12.4  HCT 36.9  PLT 159   ------------------------------------------------------------------------------------------------------------------  Chemistries  Recent Labs  Lab 11/16/17 0636  11/17/17 0900  NA 125*   < > 127*  K 4.9   < > 4.6  CL 90*   < > 92*  CO2 26   < > 26  GLUCOSE 169*   < > 132*  BUN 32*   < > 28*  CREATININE 1.17*   < > 1.02*  CALCIUM 8.8*   < > 9.1  AST 47*  --   --   ALT 41  --   --   ALKPHOS 57  --   --  BILITOT 1.2  --   --    < > = values in this interval not displayed.   ------------------------------------------------------------------------------------------------------------------  Cardiac Enzymes No results for input(s): TROPONINI in the last 168 hours. ------------------------------------------------------------------------------------------------------------------  RADIOLOGY:  Dg Chest 2 View  Result Date: 11/15/2017 CLINICAL DATA:   Evaluate for pneumonia. EXAM: CHEST - 2 VIEW COMPARISON:  Chest x-ray dated May 17, 2017. FINDINGS: Heart size remains borderline enlarged. Normal pulmonary vascularity. Patchy opacities in the left upper lobe and lingula. The right lung is clear. No pleural effusion or pneumothorax. No acute osseous abnormality. IMPRESSION: Patchy opacities in left upper lobe and lingula, concerning for pneumonia. Followup PA and lateral chest X-ray is recommended in 3-4 weeks following trial of antibiotic therapy to ensure resolution and exclude underlying malignancy. Electronically Signed   By: Titus Dubin M.D.   On: 11/15/2017 19:12   Dg Chest Port 1 View  Result Date: 11/17/2017 CLINICAL DATA:  84 year old female with shortness of breath, suspected multilobar left lung pneumonia. EXAM: PORTABLE CHEST 1 VIEW COMPARISON:  11/15/2017 and earlier. FINDINGS: Portable AP upright view at 0804 hours. Persistent patchy and confluent opacities in the left mid and lower lung. Still, left mid lung ventilation does appear mildly improved from 2 days ago. Stable cardiomegaly and mediastinal contours. The left lung apex and right lung are stable in clear. No pneumothorax or pleural effusion. Stable visualized osseous structures. IMPRESSION: 1. Continued multifocal left lung opacity compatible with pneumonia. Stable to mildly improved left mid lung ventilation. No pleural effusion is evident. 2. No new cardiopulmonary abnormality. Electronically Signed   By: Genevie Ann M.D.   On: 11/17/2017 09:48    EKG:   Orders placed or performed during the hospital encounter of 05/15/17  . ED EKG within 10 minutes  . ED EKG within 10 minutes  . ED EKG  . ED EKG  . EKG    ASSESSMENT AND PLAN:   1 .community-acquired pneumonia, failed outpatient therapy, continue Levaquin, bronchodilator, IV steroids, add Tussionex,  slowly improving.  Add cough drops.  #2 COPD: Currently well controlled, bronchodilators, large.  Pleural fluid cytology  is negative for malignancy.  No pleural effusion by chest x-ray this time.  Patient chest x-ray today morning showed multifocal pneumonia on the left side.  Hypoxia resolving.,  Add incentive spirometry to help with her hoarseness.  Patient likely has laryngitis the past 2-3 days with loss of voice.   3.  History of breast cancer, history of pleural effusion, status post thoracocentesis recently in 2018, no need for CT chest.  4.  Acute hyponatremia secondary to dehydration, improving, sodium 127 today.  Patient sodium dropped to 124 today morning for which nephrology consult requested.  We can hold off on nephrology consult.    Essential hypertension: Controlled, change  home medication to her home medicines. #7. depression: Continue Prozac, Xanax                                         Deconditioning: Physical therapy consult.    Discussed with RN All the records are reviewed and case discussed with Care Management/Social Workerr. Management plans discussed with the patient, family and they are in agreement.  CODE STATUS: full  TOTAL TIME TAKING CARE OF THIS PATIENT: 35 minutes.   POSSIBLE D/C IN 1-2DAYS, DEPENDING ON CLINICAL CONDITION.   Epifanio Lesches M.D on 11/17/2017 at 1:51 PM  Between 7am to 6pm - Pager - [phone]  After 6pm go to [email or web address] - password [email or web address] [facility]  Office  [phone]  CC: Primary care physician; [name], MD   Note: This dictation was prepared with Dragon dictation along with smaller phrase technology. Any transcriptional errors that result from this process are unintentional.

## 2017-11-17 NOTE — Care Management Note (Signed)
Case Management Note  Patient Details  Name: Janice Perkins MRN: [id number] Date of Birth: 1933-08-07  Subjective/Objective:                  Admitted to [facility] with the diagnosis of pneumonia. Lives with husband [name] [phone]). Last seen Dr. Ginette Pitman 11/15/17. Prescriptions are filled at [facility] in [place] x 2 with [place] in the past. [place] x 2 in the past. Home oxygen 2 liters per nasal cannula at night only. Oxygen is provided by [facility]. Rolling walker, shower bench and nebulizer in the home. Takes care of all basic activities of daily living herself, drives. Last fall was a week ago. ([place]). Decreased appetite.  Action/Plan: Physical therapy evaluation completed. Would like [facility] again.  Will update Floydene Flock, [facility] representative.   Expected Discharge Date:                  Expected Discharge Plan:     In-House Referral:   yes  Discharge planning Services   yes  Post Acute Care Choice:   yes Choice offered to:   patient  DME Arranged:    DME Agency:     HH Arranged:   yes HH Agency:   [facility]  Status of Service:     If discussed at [place] of Stay Meetings, dates discussed:    Additional Comments:  Shelbie Ammons, RN MSN CCM Care Management [phone] 11/17/2017, 11:27 AM

## 2017-11-18 LAB — BASIC METABOLIC PANEL
Anion gap: 8 (ref 5–15)
BUN: 28 mg/dL — AB (ref 6–20)
CHLORIDE: 93 mmol/L — AB (ref 101–111)
CO2: 28 mmol/L (ref 22–32)
Calcium: 9.2 mg/dL (ref 8.9–10.3)
Creatinine, Ser: 1.05 mg/dL — ABNORMAL HIGH (ref 0.44–1.00)
GFR calc non Af Amer: 48 mL/min — ABNORMAL LOW (ref >60)
GFR, EST AFRICAN AMERICAN: 55 mL/min — AB (ref >60)
Glucose, Bld: 137 mg/dL — ABNORMAL HIGH (ref 65–99)
POTASSIUM: 5.3 mmol/L — AB (ref 3.5–5.1)
SODIUM: 129 mmol/L — AB (ref 135–145)

## 2017-11-18 MED ORDER — METHOCARBAMOL 500 MG PO TABS
500.0000 mg | ORAL_TABLET | Freq: Two times a day (BID) | ORAL | Status: DC | PRN
  Administered 2017-11-18 – 2017-11-21 (×5): 500 mg via ORAL
  Filled 2017-11-18 (×7): qty 1

## 2017-11-18 MED ORDER — HYDROCOD POLST-CPM POLST ER 10-8 MG/5ML PO SUER
5.0000 mL | Freq: Two times a day (BID) | ORAL | Status: DC
  Administered 2017-11-18 – 2017-11-21 (×7): 5 mL via ORAL
  Filled 2017-11-18 (×7): qty 5

## 2017-11-18 NOTE — Care Management Important Message (Signed)
Important Message  Patient Details  Name: Janice Perkins MRN: [id number] Date of Birth: August 18, 1932   Medicare Important Message Given:  Yes    Shelbie Ammons, RN 11/18/2017, 7:42 AM

## 2017-11-18 NOTE — Progress Notes (Signed)
PT Cancellation Note  Patient Details Name: Janice Perkins MRN: [id number] DOB: May 18, 1933   Cancelled Treatment:    Reason Eval/Treat Not Completed: Medical issues which prohibited therapy(Per chart review, patient noted with critically elevated potassium (5.3) this date.  Will hold therapy and continue efforts as labs normalized and patient appropriate for exertional activity.)   Anastacia Reinecke H. Owens Shark, PT, DPT, NCS 11/18/17, 12:17 PM [phone]

## 2017-11-18 NOTE — Progress Notes (Signed)
[place] at [place] NAME: Janice Perkins    MR#:  [id number]  DATE OF BIRTH:  10-08-1932  Subjective: Patient still has dry cough, spitting up some blood she is otherwise okay 84,and denies any complaints.  Not on oxygen.  CHIEF COMPLAINT:  No chief complaint on file.   REVIEW OF SYSTEMS:   ROS CONSTITUTIONAL: No fever, .  EYES: No blurred or double vision.  EARS, NOSE, AND THROAT: No tinnitus or ear pain.  RESPIRATORY: Dry cough, mild shortness of breath cARDIOVASCULAR: No chest pain, orthopnea, edema.  GASTROINTESTINAL: No nausea, vomiting, diarrhea or abdominal pain.  GENITOURINARY: No dysuria, hematuria.  ENDOCRINE: No polyuria, nocturia,  HEMATOLOGY: No anemia, easy bruising or bleeding SKIN: No rash or lesion. MUSCULOSKELETAL: No joint pain or arthritis.   NEUROLOGIC: No tingling, numbness, weakness.  PSYCHIATRY: No anxiety or depression.   DRUG ALLERGIES:   Allergies  Allergen Reactions  . Penicillins Hives    .Has patient had a PCN reaction causing immediate rash, facial/tongue/throat swelling, SOB or lightheadedness with hypotension: Unknown Has patient had a PCN reaction causing severe rash involving mucus membranes or skin necrosis: Unknown Has patient had a PCN reaction that required hospitalization: Unknown Has patient had a PCN reaction occurring within the last 10 years: Unknown If all of the above answers are "NO", then may proceed with Cephalosporin use.   . Captopril Other (See Comments)  . Enalapril Maleate     Other reaction(s): Unknown  . Seldane  [Terfenadine] Other (See Comments)  . Tape Itching  . Augmentin [Amoxicillin-Pot Clavulanate] Rash  . Biaxin [Clarithromycin] Rash, Other (See Comments) and Hives    Other reaction(s): Unknown    VITALS:  Blood pressure 129/83, pulse 96, temperature 97.7 F (36.5 C), temperature source Oral, resp. rate 18, height 5\' 1"  (1.549 m), weight 71.7 kg (158 lb 1.1  oz), SpO2 94 %.  PHYSICAL EXAMINATION:  GENERAL:  84 y.o.-year-old patient lying in the bed with no acute distress.  EYES: Pupils equal, round, reactive to light and accommodation. No scleral icterus. Extraocular muscles intact.  HEENT: Head atraumatic, normocephalic. Oropharynx and nasopharynx clear.  NECK:  Supple, no jugular venous distention. No thyroid enlargement, no tenderness.  LUNGS: Overall diminished breath sound bilaterally but very faint wheezing at the bases,no rales,rhonchi or crepitation. No use of accessory muscles of respiration.  CARDIOVASCULAR: S1, S2 normal. No murmurs, rubs, or gallops.  ABDOMEN: Soft, nontender, nondistended. Bowel sounds present. No organomegaly or mass.  EXTREMITIES: No pedal edema, cyanosis, or clubbing.  NEUROLOGIC: Cranial nerves II through XII are intact. Muscle strength 5/5 in all extremities. Sensation intact. Gait not checked.  PSYCHIATRIC: The patient is alert and oriented x 3.  SKIN: No obvious rash, lesion, or ulcer.    LABORATORY PANEL:   CBC Recent Labs  Lab 11/16/17 0636  WBC 23.7*  HGB 12.4  HCT 36.9  PLT 159   ------------------------------------------------------------------------------------------------------------------  Chemistries  Recent Labs  Lab 11/16/17 0636  11/18/17 0747  NA 125*   < > 129*  K 4.9   < > 5.3*  CL 90*   < > 93*  CO2 26   < > 28  GLUCOSE 169*   < > 137*  BUN 32*   < > 28*  CREATININE 1.17*   < > 1.05*  CALCIUM 8.8*   < > 9.2  AST 47*  --   --   ALT 41  --   --  ALKPHOS 57  --   --   BILITOT 1.2  --   --    < > = values in this interval not displayed.   ------------------------------------------------------------------------------------------------------------------  Cardiac Enzymes No results for input(s): TROPONINI in the last 168 hours. ------------------------------------------------------------------------------------------------------------------  RADIOLOGY:  Dg Chest Port 1  View  Result Date: 11/17/2017 CLINICAL DATA:  84 year old female with shortness of breath, suspected multilobar left lung pneumonia. EXAM: PORTABLE CHEST 1 VIEW COMPARISON:  11/15/2017 and earlier. FINDINGS: Portable AP upright view at 0804 hours. Persistent patchy and confluent opacities in the left mid and lower lung. Still, left mid lung ventilation does appear mildly improved from 2 days ago. Stable cardiomegaly and mediastinal contours. The left lung apex and right lung are stable in clear. No pneumothorax or pleural effusion. Stable visualized osseous structures. IMPRESSION: 1. Continued multifocal left lung opacity compatible with pneumonia. Stable to mildly improved left mid lung ventilation. No pleural effusion is evident. 2. No new cardiopulmonary abnormality. Electronically Signed   By: Genevie Ann M.D.   On: 11/17/2017 09:48    EKG:   Orders placed or performed during the hospital encounter of 05/15/17  . ED EKG within 10 minutes  . ED EKG within 10 minutes  . ED EKG  . ED EKG  . EKG    ASSESSMENT AND PLAN:   1 .community-acquired pneumonia, failed outpatient therapy, continue Levaquin, bronchodilator, IV steroids, add Tussionex,  slowly improving.  Patient feels like she is not ready to go home yet she still has dry cough and,blood tinged .   #2. COPD:, Acute on chronic respiratory failure, patient has 2 L of oxygen at night.  Currently well controlled, bronchodilators, large.  Pleural fluid cytology is negative for malignancy.  No pleural effusion by chest x-ray this time.  Patient chest x-ray today morning showed multifocal pneumonia on the left side.  Hypoxia resolving.,  Add incentive spirometry to help with her hoarseness.  Patient likely has laryngitis the past 2-3 days with loss of voice. ,  Blood tinged sputum.  3.  History of breast cancer, history of pleural effusion, status post thoracocentesis recently in 2018, no need for CT chest.  4.  Acute hyponatremia secondary to  dehydration, improving, sodium 129 today.   morning for which nephrology consult requested.  We can hold off on nephrology consult.    Essential hypertension: Controlled, change  home medication to her home medicines. #7. depression: Continue Prozac, Xanax                                         Deconditioning: Physical therapy recommended home health physical therapy Discussed with RN All the records are reviewed and case discussed with Care Management/Social Workerr. Management plans discussed with the patient, family and they are in agreement.  CODE STATUS: full  TOTAL TIME TAKING CARE OF THIS PATIENT: 35 minutes.   POSSIBLE D/C IN 1-2DAYS, DEPENDING ON CLINICAL CONDITION.   Epifanio Lesches M.D on 11/18/2017 at 10:04 AM  Between 7am to 6pm - Pager - [phone]  After 6pm go to [email or web address] - password [email or web address] [facility]  Office  [phone]  CC: Primary care physician; [name], MD   Note: This dictation was prepared with Dragon dictation along with smaller phrase technology. Any transcriptional errors that result from this process are unintentional.

## 2017-11-19 LAB — BASIC METABOLIC PANEL
Anion gap: 9 (ref 5–15)
Anion gap: 10 (ref 5–15)
BUN: 30 mg/dL — ABNORMAL HIGH (ref 6–20)
BUN: 37 mg/dL — ABNORMAL HIGH (ref 6–20)
CHLORIDE: 90 mmol/L — AB (ref 101–111)
CHLORIDE: 88 mmol/L — AB (ref 101–111)
CO2: 27 mmol/L (ref 22–32)
CO2: 25 mmol/L (ref 22–32)
CREATININE: 1.01 mg/dL — AB (ref 0.44–1.00)
CREATININE: 1.05 mg/dL — AB (ref 0.44–1.00)
Calcium: 9.1 mg/dL (ref 8.9–10.3)
Calcium: 9.1 mg/dL (ref 8.9–10.3)
GFR calc Af Amer: 58 mL/min — ABNORMAL LOW (ref >60)
GFR calc non Af Amer: 50 mL/min — ABNORMAL LOW (ref >60)
GFR calc non Af Amer: 48 mL/min — ABNORMAL LOW (ref >60)
GFR, EST AFRICAN AMERICAN: 55 mL/min — AB (ref >60)
Glucose, Bld: 145 mg/dL — ABNORMAL HIGH (ref 65–99)
Glucose, Bld: 135 mg/dL — ABNORMAL HIGH (ref 65–99)
POTASSIUM: 5.1 mmol/L (ref 3.5–5.1)
Potassium: 5.3 mmol/L — ABNORMAL HIGH (ref 3.5–5.1)
SODIUM: 126 mmol/L — AB (ref 135–145)
SODIUM: 123 mmol/L — AB (ref 135–145)

## 2017-11-19 LAB — SODIUM: SODIUM: 125 mmol/L — AB (ref 135–145)

## 2017-11-19 MED ORDER — PREDNISONE 50 MG PO TABS
50.0000 mg | ORAL_TABLET | Freq: Every day | ORAL | Status: DC
  Administered 2017-11-20 – 2017-11-21 (×2): 50 mg via ORAL
  Filled 2017-11-19 (×2): qty 1

## 2017-11-19 MED ORDER — TOLVAPTAN 15 MG PO TABS
15.0000 mg | ORAL_TABLET | ORAL | Status: DC
  Administered 2017-11-19: 15 mg via ORAL
  Filled 2017-11-19 (×4): qty 1

## 2017-11-19 MED ORDER — IPRATROPIUM-ALBUTEROL 0.5-2.5 (3) MG/3ML IN SOLN
3.0000 mL | Freq: Three times a day (TID) | RESPIRATORY_TRACT | Status: DC
  Administered 2017-11-19 – 2017-11-21 (×5): 3 mL via RESPIRATORY_TRACT
  Filled 2017-11-19 (×6): qty 3

## 2017-11-19 NOTE — Plan of Care (Signed)
Sodium level 123, [place] initiated. Pt a&ox4. Hemoptysis, very small amt. Dr Vianne Bulls notified, aspirin d/c. Last BM on 11/16/17, senokot given, no BM yet. C/o abdominal cramps, norco given with improvement. Xanax given once for anxiety with improvement.

## 2017-11-19 NOTE — Progress Notes (Signed)
[facility] [place] NAME: Shawnda Mauney    MR#:  [id number]  DATE OF BIRTH:  [birth year]  Subjective: Hoarse voice, dry cough but still bothering.  Patient states that she had a bad night with back pain and has to be systolic recliner.  She is so uncomfortable in the bed because of the position.  No hypoxia but she still has a dry cough, occasional blood-tinged sputum.  CHIEF COMPLAINT:  No chief complaint on file.   REVIEW OF SYSTEMS:   Review of Systems  Respiratory: Positive for cough, sputum production and wheezing.   Musculoskeletal: Positive for back pain.   CONSTITUTIONAL: No fever, .  EYES: No blurred or double vision.  EARS, NOSE, AND THROAT: No tinnitus or ear pain.  RESPIRATORY: Dry cough, mild shortness of breath, hoarseness of the voice. cARDIOVASCULAR: No chest pain, orthopnea, edema.  GASTROINTESTINAL: No nausea, vomiting, diarrhea or abdominal pain.  GENITOURINARY: No dysuria, hematuria.  ENDOCRINE: No polyuria, nocturia,  HEMATOLOGY: No anemia, easy bruising or bleeding SKIN: No rash or lesion. MUSCULOSKELETAL: No joint pain or arthritis.   NEUROLOGIC: No tingling, numbness, weakness.  PSYCHIATRY: No anxiety or depression.   DRUG ALLERGIES:   Allergies  Allergen Reactions  . Penicillins Hives    .Has patient had a PCN reaction causing immediate rash, facial/tongue/throat swelling, SOB or lightheadedness with hypotension: Unknown Has patient had a PCN reaction causing severe rash involving mucus membranes or skin necrosis: Unknown Has patient had a PCN reaction that required hospitalization: Unknown Has patient had a PCN reaction occurring within the last 10 years: Unknown If all of the above answers are "NO", then may proceed with Cephalosporin use.   . Captopril Other (See Comments)  . Enalapril Maleate     Other reaction(s): Unknown  . Seldane  [Terfenadine] Other (See Comments)  . Tape Itching  .  Augmentin [Amoxicillin-Pot Clavulanate] Rash  . Biaxin [Clarithromycin] Rash, Other (See Comments) and Hives    Other reaction(s): Unknown    VITALS:  Blood pressure 117/71, pulse 76, temperature 97.7 F (36.5 C), temperature source Oral, resp. rate 18, height 5\' 1"  (1.549 m), weight 71.7 kg (158 lb 1.1 oz), SpO2 97 %.  PHYSICAL EXAMINATION:  GENERAL:  84 y.o.-year-old patient lying in the bed with no acute distress.  EYES: Pupils equal, round, reactive to light and accommodation. No scleral icterus. Extraocular muscles intact.  HEENT: Head atraumatic, normocephalic. Oropharynx and nasopharynx clear.  NECK:  Supple, no jugular venous distention. No thyroid enlargement, no tenderness.  LUNGS: Overall diminished breath sound bilaterally but very faint wheezing at the bases,no rales,rhonchi or crepitation. No use of accessory muscles of respiration.  CARDIOVASCULAR: S1, S2 normal. No murmurs, rubs, or gallops.  ABDOMEN: Soft, nontender, nondistended. Bowel sounds present. No organomegaly or mass.  EXTREMITIES: No pedal edema, cyanosis, or clubbing.  NEUROLOGIC: Cranial nerves II through XII are intact. Muscle strength 5/5 in all extremities. Sensation intact. Gait not checked.  PSYCHIATRIC: The patient is alert and oriented x 3.  SKIN: No obvious rash, lesion, or ulcer.    LABORATORY PANEL:   CBC Recent Labs  Lab 11/16/17 0636  WBC 23.7*  HGB 12.4  HCT 36.9  PLT 159   ------------------------------------------------------------------------------------------------------------------  Chemistries  Recent Labs  Lab 11/16/17 0636  11/18/17 0747  NA 125*   < > 129*  K 4.9   < > 5.3*  CL 90*   < > 93*  CO2 26   < >  28  GLUCOSE 169*   < > 137*  BUN 32*   < > 28*  CREATININE 1.17*   < > 1.05*  CALCIUM 8.8*   < > 9.2  AST 47*  --   --   ALT 41  --   --   ALKPHOS 57  --   --   BILITOT 1.2  --   --    < > = values in this interval not displayed.    ------------------------------------------------------------------------------------------------------------------  Cardiac Enzymes No results for input(s): TROPONINI in the last 168 hours. ------------------------------------------------------------------------------------------------------------------  RADIOLOGY:  No results found.  EKG:   Orders placed or performed during the hospital encounter of 05/15/17  . ED EKG within 10 minutes  . ED EKG within 10 minutes  . ED EKG  . ED EKG  . EKG    ASSESSMENT AND PLAN:   1 .community-acquired pneumonia, failed outpatient therapy, continue Levaquin, bronchodilator, IV steroids, add Tussionex,  slowly improving.  Patient feels like she is not ready to go home yet she still has dry cough and,blood tinged .   #2. COPD:, Acute on chronic respiratory failure, patient has 2 L of oxygen at night.  Currently well controlled, bronchodilators, large.  Pleural fluid cytology is negative for malignancy.  No pleural effusion by chest x-ray this time.  Patient chest x-ray today morning showed multifocal pneumonia on the left side.  Hypoxia resolving.,  Add incentive spirometry to help with her hoarseness.  Patient likely has laryngitis the past 2-3 days with loss of voice. ,  Blood tinged sputum. Hoarseness of the voice is still main Main concern, patient says her cough is loosening up.she says she does not want to go home yet.likley d/c am,decrease iv steroids to help with er shakes   3.  History of breast cancer, history of pleural effusion, status post thoracocentesis recently in 2018, no need for CT chest.  4.  Acute hyponatremia secondary to dehydration, improving, sodium 129 today.   Check sodium today.     Essential hypertension: Controlled, continue hydralazine, Cardizem.   #7. depression: Continue Prozac, Xanax, patient has chronic back pain,                                          Deconditioning: Physical therapy recommended home health  physical therapy.  Likely discharge tomorrow.  Discussed with RN All the records are reviewed and case discussed with Care Management/Social Workerr. Management plans discussed with the patient, family and they are in agreement.  CODE STATUS: full  TOTAL TIME TAKING CARE OF THIS PATIENT: 35 minutes.   POSSIBLE D/C IN 1-2DAYS, DEPENDING ON CLINICAL CONDITION.   Epifanio Lesches M.D on 11/19/2017 at 8:59 AM  Between 7am to 6pm - Pager - [phone]  After 6pm go to [email or web address] - password [email or web address] [facility]  Office  [phone]  CC: Primary care physician; [name], MD   Note: This dictation was prepared with Dragon dictation along with smaller phrase technology. Any transcriptional errors that result from this process are unintentional.

## 2017-11-20 LAB — BASIC METABOLIC PANEL
Anion gap: 11 (ref 5–15)
BUN: 29 mg/dL — ABNORMAL HIGH (ref 6–20)
CHLORIDE: 90 mmol/L — AB (ref 101–111)
CO2: 26 mmol/L (ref 22–32)
Calcium: 9.1 mg/dL (ref 8.9–10.3)
Creatinine, Ser: 1.05 mg/dL — ABNORMAL HIGH (ref 0.44–1.00)
GFR calc Af Amer: 55 mL/min — ABNORMAL LOW (ref >60)
GFR, EST NON AFRICAN AMERICAN: 48 mL/min — AB (ref >60)
GLUCOSE: 113 mg/dL — AB (ref 65–99)
POTASSIUM: 4.8 mmol/L (ref 3.5–5.1)
Sodium: 127 mmol/L — ABNORMAL LOW (ref 135–145)

## 2017-11-20 MED ORDER — BLISTEX MEDICATED EX OINT
TOPICAL_OINTMENT | CUTANEOUS | Status: DC | PRN
  Filled 2017-11-20: qty 6.3

## 2017-11-20 MED ORDER — GUAIFENESIN ER 600 MG PO TB12
600.0000 mg | ORAL_TABLET | Freq: Two times a day (BID) | ORAL | Status: DC
  Administered 2017-11-20 – 2017-11-21 (×2): 600 mg via ORAL
  Filled 2017-11-20 (×2): qty 1

## 2017-11-20 MED ORDER — GUAIFENESIN-DM 100-10 MG/5ML PO SYRP
5.0000 mL | ORAL_SOLUTION | ORAL | Status: DC | PRN
  Administered 2017-11-20 – 2017-11-21 (×4): 5 mL via ORAL
  Filled 2017-11-20 (×5): qty 5

## 2017-11-20 MED ORDER — LEVOFLOXACIN 750 MG PO TABS: 750.0000 mg | ORAL_TABLET | ORAL | Status: DC

## 2017-11-20 MED ORDER — LIP MEDEX EX OINT: 1.0000 "application " | TOPICAL_OINTMENT | CUTANEOUS | Status: DC | PRN

## 2017-11-20 NOTE — Progress Notes (Signed)
Physical Therapy Treatment Patient Details Name: Janice Perkins MRN: [id number] DOB: [birth year] Today's Date: 11/20/2017    History of Present Illness Pt is a 84 y/o F who presented to hospital w/ SOB and cough 11/15/17. Pt admitted 11/15/17 w/ diagnosis of pneumonia. PMHx includes: asthma, afib, breast cancer, COPD, HTN, pneumonia, pleural effusion, shingles (2017), and back surgery (10/2016).    PT Comments    Pt sitting edge of bed with husband and grandson when walking by room.  Pt needing to use bathroom but hesitant to get up with family.  Assisted pt to bedside commode where she relied heavily on walker to turn.  After voiding returned to bed with min guard/assist to transfer.    Pt had removed oxygen upon arrival with family.  Sats 83% on room air.  Replaced O2 cannula at 2 lpm and she increased to 92% with proper breathing.  Pt coughing up bloody secretions during time in room.  Pt declined further gait at this time due to general fatigue.  While initial recommendations were for HHPT since she performed well during eval, she has had limited therapy due and contraindications at times.  She was limited in her abilities today and may need further mobility assessments to make sure d/c to home is the most appropriate as she recovers.       Follow Up Recommendations  Home health PT;Supervision/Assistance - 24 hour;Supervision for mobility/OOB  See above     Equipment Recommendations  Rolling walker with 5" wheels    Recommendations for Other Services       Precautions / Restrictions Precautions Precautions: Fall Restrictions Weight Bearing Restrictions: No    Mobility  Bed Mobility Overal bed mobility: Modified Independent;Needs Assistance Bed Mobility: Supine to Sit;Sit to Supine     Supine to sit: Supervision;HOB elevated Sit to supine: Supervision;HOB elevated      Transfers Overall transfer level: Needs assistance Equipment used: None Transfers: Sit to/from Stand Sit  to Stand: Min guard            Ambulation/Gait             General Gait Details: declined due to fatigue   Stairs            Wheelchair Mobility    Modified Rankin (Stroke Patients Only)       Balance Overall balance assessment: Needs assistance Sitting-balance support: No upper extremity supported;Feet supported Sitting balance-Leahy Scale: Good     Standing balance support: No upper extremity supported Standing balance-Leahy Scale: Poor Standing balance comment: requries UE support on commode to turn                            Cognition Arousal/Alertness: Awake/alert Behavior During Therapy: WFL for tasks assessed/performed Overall Cognitive Status: Within Functional Limits for tasks assessed                                        Exercises      General Comments        Pertinent Vitals/Pain Pain Assessment: 0-10 Pain Score: 5  Pain Location: back (Pt reports back surgery 10/2016) Pain Descriptors / Indicators: Sore Pain Intervention(s): Limited activity within patient's tolerance    Home Living                      Prior Function  PT Goals (current goals can now be found in the care plan section) Progress towards PT goals: Not progressing toward goals - comment    Frequency    Min 2X/week      PT Plan Current plan remains appropriate;Other (comment)    Co-evaluation              AM-PAC PT "6 Clicks" Daily Activity  Outcome Measure  Difficulty turning over in bed (including adjusting bedclothes, sheets and blankets)?: A Little Difficulty moving from lying on back to sitting on the side of the bed? : A Little Difficulty sitting down on and standing up from a chair with arms (e.g., wheelchair, bedside commode, etc,.)?: Unable Help needed moving to and from a bed to chair (including a wheelchair)?: A Little Help needed walking in hospital room?: A Lot Help needed climbing 3-5  steps with a railing? : A Lot 6 Click Score: 14    End of Session Equipment Utilized During Treatment: Gait belt Activity Tolerance: Patient limited by fatigue Patient left: in bed;with bed alarm set;with call bell/phone within reach;with family/visitor present         Time: 5697-9480 PT Time Calculation (min) (ACUTE ONLY): 13 min  Charges:  $Therapeutic Activity: 8-22 mins                    G Codes:       Chesley Noon, PTA 11/20/17, 2:49 PM

## 2017-11-20 NOTE — Progress Notes (Signed)
[place] at [facility]                                                                                                                                                                                  Patient Demographics   Janice Perkins, is a 84 y.o. female, DOB - 1932-11-27, GDJ:[id number]  Admit date - 11/15/2017   Admitting Physician Nicholes Mango, MD  Outpatient Primary MD for the patient is [name], MD   LOS - 5  Subjective: Patient seen and evaluated today Has cough productive of yellowish phlegm and still has some blood-streaked sputum Currently on oxygen via 2 L via nasal cannula at nighttime No chest pain Fever and chills Has pain in the left shoulder as well as left lower rib cage on coughing   Review of Systems:   CONSTITUTIONAL: No documented fever. No fatigue, weakness. No weight gain, no weight loss.  EYES: No blurry or double vision.  ENT: No tinnitus. No postnasal drip. No redness of the oropharynx.  RESPIRATORY: has cough, scattered wheezing,  no hemoptysis. No dyspnea.  CARDIOVASCULAR: No chest pain. No orthopnea. No palpitations. No syncope.  GASTROINTESTINAL: No nausea, no vomiting or diarrhea. No abdominal pain. No melena or hematochezia.  GENITOURINARY: No dysuria or hematuria.  ENDOCRINE: No polyuria or nocturia. No heat or cold intolerance.  HEMATOLOGY: No anemia. No bruising. No bleeding.  INTEGUMENTARY: No rashes. No lesions.  MUSCULOSKELETAL: No arthritis. No swelling. No gout.  NEUROLOGIC: No numbness, tingling, or ataxia. No seizure-type activity.  PSYCHIATRIC: No anxiety. No insomnia. No ADD.    Vitals:   Vitals:   11/19/17 1936 11/20/17 0444 11/20/17 0718 11/20/17 1316  BP: (!) 146/74 122/64  123/75  Pulse: (!) 109 (!) 102  86  Resp: 18 17  20   Temp: 98.9 F (37.2 C) 99.5 F (37.5 C)  98.2 F (36.8 C)  TempSrc: Oral Oral  Oral  SpO2: 94% 92% 92% 94%  Weight:      Height:        Wt Readings from Last  3 Encounters:  11/15/17 71.7 kg (158 lb 1.1 oz)  05/16/17 78.6 kg (173 lb 4.8 oz)  04/08/17 78.2 kg (172 lb 8 oz)     Intake/Output Summary (Last 24 hours) at 11/20/2017 1500 Last data filed at 11/20/2017 1047 Gross per 24 hour  Intake 480 ml  Output 2700 ml  Net -2220 ml    Physical Exam:   GENERAL: Pleasant-appearing female patient in no acute distress.  HEAD, EYES, EARS, NOSE AND THROAT: Atraumatic, normocephalic. Extraocular muscles are intact. Pupils equal and reactive to light. Sclerae anicteric. No conjunctival injection. No oro-pharyngeal erythema.  NECK: Supple. There is no jugular venous distention. No bruits, no lymphadenopathy, no thyromegaly.  HEART: Regular rate and rhythm,. No murmurs, no rubs, no clicks.  LUNGS: Decreased air flow left lung. Rales heard in left lung. Wheezing in both lungs  ABDOMEN: Soft, flat, nontender, nondistended. Has good bowel sounds. No hepatosplenomegaly appreciated.  EXTREMITIES: No evidence of any cyanosis, clubbing, or peripheral edema.  +2 pedal and radial pulses bilaterally.  NEUROLOGIC: The patient is alert, awake, and oriented x3 with no focal motor or sensory deficits appreciated bilaterally.  SKIN: Moist and warm with no rashes appreciated.  Psych: Not anxious, depressed LN: No inguinal LN enlargement    Antibiotics   Anti-infectives (From admission, onward)   Start     Dose/Rate Route Frequency Ordered Stop   11/21/17 1800  levofloxacin (LEVAQUIN) tablet 750 mg     750 mg Oral Every 48 hours 11/20/17 1449     11/15/17 2000  levofloxacin (LEVAQUIN) IVPB 750 mg  Status:  Discontinued     750 mg 100 mL/hr over 90 Minutes Intravenous Every 48 hours 11/15/17 1940 11/20/17 1449      Medications   Scheduled Meds: . atorvastatin  20 mg Oral QPM  . benzonatate  100 mg Oral TID  . chlorpheniramine-HYDROcodone  5 mL Oral Q12H  . cholecalciferol  1,000 Units Oral Daily  . diltiazem  120 mg Oral Daily  . ferrous sulfate  325 mg  Oral Q breakfast  . FLUoxetine  20 mg Oral BID  . fluticasone  2 spray Each Nare Daily  . hydrALAZINE  25 mg Oral TID  . ipratropium-albuterol  3 mL Nebulization TID  . [START ON 11/21/2017] levofloxacin  750 mg Oral Q48H  . levothyroxine  75 mcg Oral QAC breakfast  . magnesium oxide  400 mg Oral BID  . pantoprazole  40 mg Oral Daily  . predniSONE  50 mg Oral Q breakfast  . tolvaptan  15 mg Oral Q24H   Continuous Infusions: PRN Meds:.acetaminophen, ALPRAZolam, docusate sodium, guaiFENesin-dextromethorphan, HYDROcodone-acetaminophen, ipratropium-albuterol, lip balm, menthol-cetylpyridinium, methocarbamol, ondansetron, senna   Data Review:   Micro Results No results found for this or any previous visit (from the past 240 hour(s)).  Radiology Reports Dg Chest 2 View  Result Date: 11/15/2017 CLINICAL DATA:  Evaluate for pneumonia. EXAM: CHEST - 2 VIEW COMPARISON:  Chest x-ray dated May 17, 2017. FINDINGS: Heart size remains borderline enlarged. Normal pulmonary vascularity. Patchy opacities in the left upper lobe and lingula. The right lung is clear. No pleural effusion or pneumothorax. No acute osseous abnormality. IMPRESSION: Patchy opacities in left upper lobe and lingula, concerning for pneumonia. Followup PA and lateral chest X-ray is recommended in 3-4 weeks following trial of antibiotic therapy to ensure resolution and exclude underlying malignancy. Electronically Signed   By: Titus Dubin M.D.   On: 11/15/2017 19:12   Dg Chest Port 1 View  Result Date: 11/17/2017 CLINICAL DATA:  84 year old female with shortness of breath, suspected multilobar left lung pneumonia. EXAM: PORTABLE CHEST 1 VIEW COMPARISON:  11/15/2017 and earlier. FINDINGS: Portable AP upright view at 0804 hours. Persistent patchy and confluent opacities in the left mid and lower lung. Still, left mid lung ventilation does appear mildly improved from 2 days ago. Stable cardiomegaly and mediastinal contours. The left  lung apex and right lung are stable in clear. No pneumothorax or pleural effusion. Stable visualized osseous structures. IMPRESSION: 1. Continued multifocal left lung opacity compatible with pneumonia. Stable to mildly improved left mid lung ventilation.  No pleural effusion is evident. 2. No new cardiopulmonary abnormality. Electronically Signed   By: Genevie Ann M.D.   On: 11/17/2017 09:48     CBC Recent Labs  Lab 11/15/17 1908 11/16/17 0636  WBC 27.3* 23.7*  HGB 12.6 12.4  HCT 36.6 36.9  PLT 155 159  MCV 87.4 87.5  MCH 30.0 29.4  MCHC 34.4 33.6  RDW 16.0* 15.7*    Chemistries  Recent Labs  Lab 11/16/17 0636  11/17/17 0900 11/18/17 0747 11/19/17 0913 11/19/17 1452 11/19/17 2108 11/20/17 0523  NA 125*   < > 127* 129* 126* 123* 125* 127*  K 4.9   < > 4.6 5.3* 5.3* 5.1  --  4.8  CL 90*   < > 92* 93* 90* 88*  --  90*  CO2 26   < > 26 28 27 25   --  26  GLUCOSE 169*   < > 132* 137* 145* 135*  --  113*  BUN 32*   < > 28* 28* 30* 37*  --  29*  CREATININE 1.17*   < > 1.02* 1.05* 1.01* 1.05*  --  1.05*  CALCIUM 8.8*   < > 9.1 9.2 9.1 9.1  --  9.1  AST 47*  --   --   --   --   --   --   --   ALT 41  --   --   --   --   --   --   --   ALKPHOS 57  --   --   --   --   --   --   --   BILITOT 1.2  --   --   --   --   --   --   --    < > = values in this interval not displayed.   ------------------------------------------------------------------------------------------------------------------ estimated creatinine clearance is 36.8 mL/min (A) (by C-G formula based on SCr of 1.05 mg/dL (H)). ------------------------------------------------------------------------------------------------------------------ No results for input(s): HGBA1C in the last 72 hours. ------------------------------------------------------------------------------------------------------------------ No results for input(s): CHOL, HDL, LDLCALC, TRIG, CHOLHDL, LDLDIRECT in the last 72  hours. ------------------------------------------------------------------------------------------------------------------ No results for input(s): TSH, T4TOTAL, T3FREE, THYROIDAB in the last 72 hours.  Invalid input(s): FREET3 ------------------------------------------------------------------------------------------------------------------ No results for input(s): VITAMINB12, FOLATE, FERRITIN, TIBC, IRON, RETICCTPCT in the last 72 hours.  Coagulation profile No results for input(s): INR, PROTIME in the last 168 hours.  No results for input(s): DDIMER in the last 72 hours.  Cardiac Enzymes No results for input(s): CKMB, TROPONINI, MYOGLOBIN in the last 168 hours.  Invalid input(s): CK ------------------------------------------------------------------------------------------------------------------ Invalid input(s): [place]   1.  Community-acquired pneumonia improving slowly Continue IV Levaquin antibiotic Continue bronchodilators Oxygen at bedtime by nasal cannula  2.  Acute on chronic respiratory failure Continue oxygen at bedtime via nasal cannula Pleural fluid cytology is negative for malignancy No pleural effusion in the current chest x-ray Hypoxia improved  3.  Laryngitis Responsible for blood-streaked sputum Cough is loosening up slowly  4.  History of breast cancer  5.  Emphysema Continue steroids and nebulization treatments  6.  Physical deconditioning Physical therapy evaluation  7.  Disposition in a.m. if clinically stable       Code Status Orders  (From admission, onward)        Start     Ordered   11/15/17 1830  Full code  Continuous     11/15/17 1829    Code Status History  Date Active Date Inactive Code Status Order ID Comments User Context   05/16/2017 0041 05/17/2017 1824 Full Code [id number]  Quintella Baton, MD ED   03/07/2017 1446 03/09/2017 1509 Full Code [id number]  Idelle Crouch, MD Inpatient   01/19/2017 0837  01/21/2017 1415 Full Code [id number]  Harrie Foreman, MD Inpatient   12/18/2016 0718 12/22/2016 1947 Full Code [id number]  Harrie Foreman, MD Inpatient    Advance Directive Documentation     Most Recent Value  Type of Advance Directive  Living will  Pre-existing out of facility DNR order (yellow form or pink MOST form)  -  "MOST" Form in Place?  -     Time Spent in minutes   35  Greater than 50% of time spent in care coordination and counseling patient regarding the condition and plan of care.   Saundra Shelling M.D on 11/20/2017 at 3:00 PM  Between 7am to 6pm - Pager - [phone]  After 6pm go to [email or web address] - Proofreader  [employer]   Office  [phone]

## 2017-11-20 NOTE — Consult Note (Addendum)
Pharmacy Antibiotic Note  Janice Perkins is a 84 y.o. female admitted on 11/15/2017 with pneumonia.  Pharmacy has been consulted for levofloxacin dosing.  Plan: Day 6  IV Abx. Will change to PO per protocol. Levofloxacin 750mg   PO every 48 hours based on current CrCl of 36.103ml/min.   Height: 5\' 1"  (154.9 cm) Weight: 158 lb 1.1 oz (71.7 kg) IBW/kg (Calculated) : 47.8  Temp (24hrs), Avg:98.9 F (37.2 C), Min:98.2 F (36.8 C), Max:99.5 F (37.5 C)  Recent Labs  Lab 11/15/17 1908 11/16/17 0636  11/17/17 0900 11/18/17 0747 11/19/17 0913 11/19/17 1452 11/20/17 0523  WBC 27.3* 23.7*  --   --   --   --   --   --   CREATININE 1.27* 1.17*   < > 1.02* 1.05* 1.01* 1.05* 1.05*   < > = values in this interval not displayed.    Estimated Creatinine Clearance: 36.8 mL/min (A) (by C-G formula based on SCr of 1.05 mg/dL (H)).    Allergies  Allergen Reactions  . Penicillins Hives    .Has patient had a PCN reaction causing immediate rash, facial/tongue/throat swelling, SOB or lightheadedness with hypotension: Unknown Has patient had a PCN reaction causing severe rash involving mucus membranes or skin necrosis: Unknown Has patient had a PCN reaction that required hospitalization: Unknown Has patient had a PCN reaction occurring within the last 10 years: Unknown If all of the above answers are "NO", then may proceed with Cephalosporin use.   . Captopril Other (See Comments)  . Enalapril Maleate     Other reaction(s): Unknown  . Seldane  [Terfenadine] Other (See Comments)  . Tape Itching  . Augmentin [Amoxicillin-Pot Clavulanate] Rash  . Biaxin [Clarithromycin] Rash, Other (See Comments) and Hives    Other reaction(s): Unknown    Antimicrobials this admission: 4/1 levofloxacin  >>   Thank you for allowing pharmacy to be a part of this patient's care.  Pernell Dupre, PharmD, BCPS Clinical Pharmacist 11/20/2017 2:46 PM

## 2017-11-21 LAB — SODIUM: SODIUM: 131 mmol/L — AB (ref 135–145)

## 2017-11-21 LAB — PROCALCITONIN: PROCALCITONIN: 0.36 ng/mL

## 2017-11-21 MED ORDER — LEVOFLOXACIN 500 MG PO TABS: 500.0000 mg | ORAL_TABLET | Freq: Every day | ORAL | 0 refills | Status: AC

## 2017-11-21 MED ORDER — GUAIFENESIN-DM 100-10 MG/5ML PO SYRP: 5.0000 mL | ORAL_SOLUTION | ORAL | 0 refills | Status: AC | PRN

## 2017-11-21 MED ORDER — PREDNISONE 50 MG PO TABS: ORAL_TABLET | ORAL | 0 refills | Status: DC

## 2017-11-21 MED ORDER — GUAIFENESIN ER 600 MG PO TB12: 600.0000 mg | ORAL_TABLET | Freq: Two times a day (BID) | ORAL | 0 refills | Status: DC

## 2017-11-21 NOTE — Progress Notes (Signed)
Family Meeting Note  Advance Directive:no  Today a meeting took place with patient  Patient wants to be full resuscitative efforts she needs CPR and also want to be on ventilator for respiratory condition gets worse.    Patient wants to be full code. Recommend DNR status because of her advanced age, COPD, hypertension  The following were discussed:Patient's diagnosis: , Patient's progosis: Guarded will follow and make further recommendation based on her improvement  Additional follow-up to be provided: We will involve palliative care if needed Time spent during discussion: time spent 15 minutes Epifanio Lesches, MD

## 2017-11-21 NOTE — Discharge Summary (Addendum)
[facility], 84 y.o., DOB Aug 15, 1933, MRN [id number]. Admission date: 11/15/2017 Discharge Date 11/21/2017 Primary MD [name], MD Admitting Physician Nicholes Mango, MD  Admission Diagnosis   1.  Community-acquired pneumonia 2.  Acute COPD exacerbation 3.  Hypertension 4.  Hyperlipidemia 5.  History of breast cancer  Discharge Diagnosis        1 pneumonia improved.  2.  COPD exacerbation resolved 3.  Hypertension 4.  Hyperlipidemia 5.  History of breast cancer  Hospital Course  84 year old elderly female patient with history of chronic atrial fibrillation, breast cancer, emphysema on home oxygen, hypertension, chronic bronchitis was admitted on 11/15/2017 for community-acquired pneumonia.  She failed outpatient antibiotic therapy.  Patient had acute on chronic respiratory failure with COPD exacerbation.  Patient received a nebulization treatments aggressively in the hospital along with IV Solu-Medrol which was switched to oral prednisone at the time of discharge.  She received IV Levaquin antibiotic for the community-acquired pneumonia which was switched to oral antibiotics with the help of pharmacy recommendations.  Patient tolerated antibiotics, steroids well.  Her breathing improved.  Her pneumonia improved.  Patient also had hyponatremia during her stay in the hospital, she received tolvaptan.  At the time of discharge sodium is 131 .she will be discharged home with home health services and follow-up with primary care physician in the clinic.  Flu test was negative.  Consults none Significant Tests:  See full reports for all details    Dg Chest 2 View  Result Date: 11/15/2017 CLINICAL DATA:  Evaluate for pneumonia. EXAM: CHEST - 2 VIEW COMPARISON:  Chest x-ray dated May 17, 2017. FINDINGS: Heart size remains borderline enlarged. Normal pulmonary vascularity. Patchy opacities in the left upper lobe and lingula. The right lung  is clear. No pleural effusion or pneumothorax. No acute osseous abnormality. IMPRESSION: Patchy opacities in left upper lobe and lingula, concerning for pneumonia. Followup PA and lateral chest X-ray is recommended in 3-4 weeks following trial of antibiotic therapy to ensure resolution and exclude underlying malignancy. Electronically Signed   By: Titus Dubin M.D.   On: 11/15/2017 19:12   Dg Chest Port 1 View  Result Date: 11/17/2017 CLINICAL DATA:  84 year old female with shortness of breath, suspected multilobar left lung pneumonia. EXAM: PORTABLE CHEST 1 VIEW COMPARISON:  11/15/2017 and earlier. FINDINGS: Portable AP upright view at 0804 hours. Persistent patchy and confluent opacities in the left mid and lower lung. Still, left mid lung ventilation does appear mildly improved from 2 days ago. Stable cardiomegaly and mediastinal contours. The left lung apex and right lung are stable in clear. No pneumothorax or pleural effusion. Stable visualized osseous structures. IMPRESSION: 1. Continued multifocal left lung opacity compatible with pneumonia. Stable to mildly improved left mid lung ventilation. No pleural effusion is evident. 2. No new cardiopulmonary abnormality. Electronically Signed   By: Genevie Ann M.D.   On: 11/17/2017 09:48       Today   Subjective:   Janice Perkins is an elderly pleasant female patient in no acute distress Has no cough today Breathing comfortably No chest pain  Objective:   Blood pressure (!) 152/76, pulse 90, temperature 97.7 F (36.5 C), temperature source Oral, resp. rate 20, height 5\' 1"  (1.549 m), weight 71.7 kg (158 lb 1.1 oz), SpO2 97 %.  . No intake or output data in the 24 hours ending 11/21/17 1328  Exam VITAL SIGNS: Blood pressure (!) 152/76, pulse 90, temperature 97.7 F (  36.5 C), temperature source Oral, resp. rate 20, height 5\' 1"  (1.549 m), weight 71.7 kg (158 lb 1.1 oz), SpO2 97 %.  GENERAL:  84 y.o.-year-old patient lying in the bed with no  acute distress.  EYES: Pupils equal, round, reactive to light and accommodation. No scleral icterus. Extraocular muscles intact.  HEENT: Head atraumatic, normocephalic. Oropharynx and nasopharynx clear.  NECK:  Supple, no jugular venous distention. No thyroid enlargement, no tenderness.  LUNGS: Normal breath sounds bilaterally, no wheezing, rales,rhonchi or crepitation. No use of accessory muscles of respiration.  CARDIOVASCULAR: S1, S2 normal. No murmurs, rubs, or gallops.  ABDOMEN: Soft, nontender, nondistended. Bowel sounds present. No organomegaly or mass.  EXTREMITIES: No pedal edema, cyanosis, or clubbing.  NEUROLOGIC: Cranial nerves II through XII are intact. Muscle strength 5/5 in all extremities. Sensation intact. Gait not checked.  PSYCHIATRIC: The patient is alert and oriented x 3.  SKIN: No obvious rash, lesion, or ulcer.   Data Review     CBC w Diff:  Lab Results  Component Value Date   WBC 23.7 (H) 11/16/2017   HGB 12.4 11/16/2017   HGB 11.6 (L) 11/26/2014   HCT 36.9 11/16/2017   HCT 35.9 11/26/2014   PLT 159 11/16/2017   PLT 186 11/26/2014   LYMPHOPCT 8 12/18/2016   LYMPHOPCT 25.9 11/26/2014   MONOPCT 14 12/18/2016   MONOPCT 12.1 11/26/2014   EOSPCT 0 12/18/2016   EOSPCT 11.0 11/26/2014   BASOPCT 0 12/18/2016   BASOPCT 1.5 11/26/2014   CMP:  Lab Results  Component Value Date   NA 131 (L) 11/21/2017   NA 140 11/26/2014   K 4.8 11/20/2017   K 3.5 11/26/2014   CL 90 (L) 11/20/2017   CL 102 11/26/2014   CO2 26 11/20/2017   CO2 28 11/26/2014   BUN 29 (H) 11/20/2017   BUN 15 11/26/2014   CREATININE 1.05 (H) 11/20/2017   CREATININE 1.07 (H) 11/26/2014   PROT 6.8 11/16/2017   ALBUMIN 3.0 (L) 11/16/2017   BILITOT 1.2 11/16/2017   ALKPHOS 57 11/16/2017   AST 47 (H) 11/16/2017   ALT 41 11/16/2017  .  Micro Results No results found for this or any previous visit (from the past 240 hour(s)).      Code Status Orders  (From admission, onward)         Start     Ordered   11/15/17 1830  Full code  Continuous     11/15/17 1829    Code Status History    Date Active Date Inactive Code Status Order ID Comments User Context   05/16/2017 0041 05/17/2017 1824 Full Code [id number]  Quintella Baton, MD ED   03/07/2017 1446 03/09/2017 1509 Full Code [id number]  Idelle Crouch, MD Inpatient   01/19/2017 0837 01/21/2017 1415 Full Code [id number]  Harrie Foreman, MD Inpatient   12/18/2016 0718 12/22/2016 1947 Full Code [id number]  Harrie Foreman, MD Inpatient    Advance Directive Documentation     Most Recent Value  Type of Advance Directive  Living will  Pre-existing out of facility DNR order (yellow form or pink MOST form)  -  "MOST" Form in Place?  -          Follow-up Information    Hande, Cherlyn Labella, MD Follow up in 1 week(s).   Specialty:  Internal Medicine Contact information: [address] [place] [address] [phone]           Discharge Medications  Allergies as of 11/21/2017      Reactions   Penicillins Hives   .Has patient had a PCN reaction causing immediate rash, facial/tongue/throat swelling, SOB or lightheadedness with hypotension: Unknown Has patient had a PCN reaction causing severe rash involving mucus membranes or skin necrosis: Unknown Has patient had a PCN reaction that required hospitalization: Unknown Has patient had a PCN reaction occurring within the last 10 years: Unknown If all of the above answers are "NO", then may proceed with Cephalosporin use.   Captopril Other (See Comments)   Enalapril Maleate    Other reaction(s): Unknown   Seldane  [terfenadine] Other (See Comments)   Tape Itching   Augmentin [amoxicillin-pot Clavulanate] Rash   Biaxin [clarithromycin] Rash, Other (See Comments), Hives   Other reaction(s): Unknown      Medication List    STOP taking these medications   ALPRAZolam 0.25 MG tablet Commonly known as:  XANAX   dicyclomine 10 MG  capsule Commonly known as:  BENTYL   FLUoxetine 20 MG capsule Commonly known as:  PROZAC   losartan 100 MG tablet Commonly known as:  COZAAR   metoprolol succinate 100 MG 24 hr tablet Commonly known as:  TOPROL-XL   ondansetron 4 MG disintegrating tablet Commonly known as:  ZOFRAN ODT   torsemide 5 MG tablet Commonly known as:  DEMADEX     TAKE these medications   acetaminophen 325 MG tablet Commonly known as:  TYLENOL Take 2 tablets (650 mg total) by mouth every 6 (six) hours as needed for mild pain (or Fever >/= 101).   albuterol 108 (90 Base) MCG/ACT inhaler Commonly known as:  PROVENTIL HFA;VENTOLIN HFA Inhale 1-2 puffs into the lungs every 6 (six) hours as needed for wheezing or shortness of breath.   aspirin 81 MG tablet Take 81 mg by mouth daily.   atorvastatin 20 MG tablet Commonly known as:  LIPITOR Take 20 mg by mouth daily.   cholecalciferol 1000 units tablet Commonly known as:  VITAMIN D Take 1,000 Units by mouth daily.   diltiazem 120 MG 24 hr capsule Commonly known as:  DILACOR XR Take 120 mg by mouth daily.   docusate sodium 100 MG capsule Commonly known as:  COLACE Take 1 capsule (100 mg total) by mouth daily.   esomeprazole 40 MG capsule Commonly known as:  NEXIUM Take 40 mg by mouth 2 (two) times daily before a meal.   ferrous sulfate 325 (65 FE) MG tablet Take 325 mg by mouth daily with breakfast.   fluticasone 50 MCG/ACT nasal spray Commonly known as:  FLONASE Place 2 sprays into both nostrils daily.   Fluticasone-Salmeterol 250-50 MCG/DOSE Aepb Commonly known as:  ADVAIR Inhale 1 puff into the lungs 2 (two) times daily.   guaiFENesin 600 MG 12 hr tablet Commonly known as:  MUCINEX Take 1 tablet (600 mg total) by mouth 2 (two) times daily.   guaiFENesin-dextromethorphan 100-10 MG/5ML syrup Commonly known as:  ROBITUSSIN DM Take 5 mLs by mouth every 4 (four) hours as needed for up to 7 days for cough (chest congestion).    hydrALAZINE 25 MG tablet Commonly known as:  APRESOLINE Take 1 tablet (25 mg total) by mouth every 6 (six) hours.   HYDROcodone-acetaminophen 5-325 MG tablet Commonly known as:  NORCO/VICODIN Take 1 tablet by mouth as needed.   levofloxacin 500 MG tablet Commonly known as:  LEVAQUIN Take 1 tablet (500 mg total) by mouth daily for 3 days. For 3 days   levothyroxine 75  MCG tablet Commonly known as:  SYNTHROID, LEVOTHROID Take 1 tablet (75 mcg total) by mouth daily before breakfast.   magnesium oxide 400 MG tablet Commonly known as:  MAG-OX Take 400 mg by mouth 2 (two) times daily.   montelukast 10 MG tablet Commonly known as:  SINGULAIR Take 10 mg by mouth daily.   predniSONE 50 MG tablet Commonly known as:  DELTASONE Label  & dispense according to the schedule below.  6 tablets day one, then 5 table day 2, then 4 tablets day 3, then 3 tablets day 4, 2 tablets day 5, then 1 tablet day 6, then stop   senna 8.6 MG Tabs tablet Commonly known as:  SENOKOT Take 1 tablet by mouth daily as needed for mild constipation.   spironolactone 25 MG tablet Commonly known as:  ALDACTONE Take 25 mg by mouth daily.          Total Time in preparing paper work, data evaluation and todays exam - 35 minutes  Saundra Shelling M.D on 11/21/2017 at 1:28 PM [place]  [phone]

## 2017-11-21 NOTE — Care Management Important Message (Signed)
Important Message  Patient Details  Name: Janice Perkins MRN: [id number] Date of Birth: 09/06/1932   Medicare Important Message Given:  Yes    Katrina Stack, RN 11/21/2017, 3:04 PM

## 2017-11-24 ENCOUNTER — Emergency Department
Admission: EM | Admit: 2017-11-24 | Discharge: 2017-11-24 | Disposition: A | Discharge status: Home / Self Care | Payer: Medicare Other | Attending: Emergency Medicine | Admitting: Emergency Medicine

## 2017-11-24 ENCOUNTER — Encounter: Payer: Self-pay | Admitting: Emergency Medicine

## 2017-11-24 ENCOUNTER — Other Ambulatory Visit: Payer: Self-pay

## 2017-11-24 DIAGNOSIS — Z853 Personal history of malignant neoplasm of breast: Secondary | ICD-10-CM | POA: Insufficient documentation

## 2017-11-24 DIAGNOSIS — I480 Paroxysmal atrial fibrillation: Secondary | ICD-10-CM | POA: Insufficient documentation

## 2017-11-24 DIAGNOSIS — I10 Essential (primary) hypertension: Secondary | ICD-10-CM | POA: Diagnosis not present

## 2017-11-24 DIAGNOSIS — R040 Epistaxis: Secondary | ICD-10-CM | POA: Diagnosis present

## 2017-11-24 DIAGNOSIS — Z7982 Long term (current) use of aspirin: Secondary | ICD-10-CM | POA: Insufficient documentation

## 2017-11-24 DIAGNOSIS — Z79899 Other long term (current) drug therapy: Secondary | ICD-10-CM | POA: Insufficient documentation

## 2017-11-24 DIAGNOSIS — J449 Chronic obstructive pulmonary disease, unspecified: Secondary | ICD-10-CM | POA: Insufficient documentation

## 2017-11-24 MED ORDER — CLONIDINE HCL 0.1 MG PO TABS
0.1000 mg | ORAL_TABLET | Freq: Once | ORAL | Status: AC
  Administered 2017-11-24: 0.1 mg via ORAL
  Filled 2017-11-24: qty 1

## 2017-11-24 MED ORDER — DIAZEPAM 2 MG PO TABS
2.0000 mg | ORAL_TABLET | Freq: Once | ORAL | Status: AC
  Administered 2017-11-24: 2 mg via ORAL
  Filled 2017-11-24 (×2): qty 1

## 2017-11-24 MED ORDER — CLONIDINE HCL 0.1 MG PO TABS: 0.1000 mg | ORAL_TABLET | Freq: Two times a day (BID) | ORAL | 0 refills | Status: DC | PRN

## 2017-11-24 MED ORDER — BACITRACIN ZINC 500 UNIT/GM EX OINT
TOPICAL_OINTMENT | Freq: Once | CUTANEOUS | Status: AC
  Administered 2017-11-24: 1 via TOPICAL
  Filled 2017-11-24: qty 0.9

## 2017-11-24 NOTE — ED Provider Notes (Signed)
[facility] Emergency Department Provider Note  ___________________________________________   First MD Initiated Contact with Patient 11/24/17 1356     (approximate)  I have reviewed the triage vital signs and the nursing notes.   HISTORY  Chief Complaint Epistaxis   HPI Janice Perkins is a 84 y.o. female with a history of atrial fibrillation as well as epistaxis who is presenting with epistaxis.  Patient started having bleeding from her nose, from the left nare, about 30 minutes prior to arrival.  However, the bleeding stopped in route.  Patient said that she had blood nodules from her nare but also from her mouth as well.  Patient also with nosebleeds the past 2 mornings that have stopped spontaneously.  Patient says that she was recently hospitalized for respiratory distress and is on steroids.  Says that this usually makes her jittery and her blood pressure high.  Family was seen by home nursing today as well with a high heart rate in addition to the high blood pressure.  She says that she is feeling very anxious and took an alprazolam already this morning but says that she does not feel relieved.  Says that she has 3 more days of her steroids.   Past Medical History:  Diagnosis Date  . Asthma   . Atrial fibrillation ([place])   . Breast cancer ([place]) 2011  . Bronchitis 04/2015  . Cancer [facility]) 2011   breast  . COPD (chronic obstructive pulmonary disease) ([place])   . Hypertension   . Shingles    10/2015    Patient Active Problem List   Diagnosis Date Noted  . Pneumonia 11/15/2017  . Pleural effusion 05/16/2017  . AF (paroxysmal atrial fibrillation) ([place]) 05/16/2017  . Breast cancer ([place]) 05/16/2017  . Dependence on nocturnal oxygen therapy 05/16/2017  . HTN (hypertension) 05/16/2017  . Generalized weakness 03/07/2017  . Hyponatremia 03/07/2017  . Dehydration 03/07/2017  . UTI (urinary tract infection) 03/07/2017  . HCAP (healthcare-associated  pneumonia) 01/19/2017  . AKI (acute kidney injury) ([place]) 12/18/2016  . Primary cancer of upper outer quadrant of right female breast ([place]) 04/07/2016  . Lumbar radiculopathy 03/23/2016  . Spinal stenosis, lumbar region, with neurogenic claudication 03/23/2016  . DDD (degenerative disc disease), lumbar 01/14/2015  . Lumbosacral facet joint syndrome 01/14/2015  . Sacroiliac joint dysfunction 01/14/2015  . Greater trochanteric bursitis 01/14/2015  . Bilateral occipital neuralgia 01/14/2015    Past Surgical History:  Procedure Laterality Date  . BREAST EXCISIONAL BIOPSY Right 11/29/13   two areas  . BREAST EXCISIONAL BIOPSY Right 10/30/2009   lumpectomy - radiation    Prior to Admission medications   Medication Sig Start Date End Date Taking? Authorizing Provider  acetaminophen (TYLENOL) 325 MG tablet Take 2 tablets (650 mg total) by mouth every 6 (six) hours as needed for mild pain (or Fever >/= 101). 12/22/16   Gouru, Illene Silver, MD  albuterol (PROVENTIL HFA;VENTOLIN HFA) 108 (90 Base) MCG/ACT inhaler Inhale 1-2 puffs into the lungs every 6 (six) hours as needed for wheezing or shortness of breath.    [provider]  aspirin 81 MG tablet Take 81 mg by mouth daily.     [provider]  atorvastatin (LIPITOR) 20 MG tablet Take 20 mg by mouth daily.    [provider]  cholecalciferol (VITAMIN D) 1000 units tablet Take 1,000 Units by mouth daily.    [provider]  diltiazem (DILACOR XR) 120 MG 24 hr capsule Take 120 mg by mouth  daily.    [provider]  docusate sodium (COLACE) 100 MG capsule Take 1 capsule (100 mg total) by mouth daily. Patient not taking: Reported on 05/15/2017 12/22/16   Nicholes Mango, MD  esomeprazole (NEXIUM) 40 MG capsule Take 40 mg by mouth 2 (two) times daily before a meal.     [provider]  ferrous sulfate 325 (65 FE) MG tablet Take 325 mg by mouth daily with breakfast.    [provider]  fluticasone  (FLONASE) 50 MCG/ACT nasal spray Place 2 sprays into both nostrils daily.     [provider]  Fluticasone-Salmeterol (ADVAIR) 250-50 MCG/DOSE AEPB Inhale 1 puff into the lungs 2 (two) times daily.    [provider]  guaiFENesin (MUCINEX) 600 MG 12 hr tablet Take 1 tablet (600 mg total) by mouth 2 (two) times daily. 11/21/17   Pyreddy, Reatha Harps, MD  guaiFENesin-dextromethorphan (ROBITUSSIN DM) 100-10 MG/5ML syrup Take 5 mLs by mouth every 4 (four) hours as needed for up to 7 days for cough (chest congestion). 11/21/17 11/28/17  Saundra Shelling, MD  hydrALAZINE (APRESOLINE) 25 MG tablet Take 1 tablet (25 mg total) by mouth every 6 (six) hours. 12/22/16   Nicholes Mango, MD  HYDROcodone-acetaminophen (NORCO/VICODIN) 5-325 MG tablet Take 1 tablet by mouth as needed.    [provider]  levofloxacin (LEVAQUIN) 500 MG tablet Take 1 tablet (500 mg total) by mouth daily for 3 days. For 3 days 11/21/17 11/24/17  Saundra Shelling, MD  levothyroxine (SYNTHROID, LEVOTHROID) 75 MCG tablet Take 1 tablet (75 mcg total) by mouth daily before breakfast. Patient not taking: Reported on 11/19/2017 12/23/16   Nicholes Mango, MD  magnesium oxide (MAG-OX) 400 MG tablet Take 400 mg by mouth 2 (two) times daily.    [provider]  montelukast (SINGULAIR) 10 MG tablet Take 10 mg by mouth daily.    [provider]  predniSONE (DELTASONE) 50 MG tablet Label  & dispense according to the schedule below.  6 tablets day one, then 5 table day 2, then 4 tablets day 3, then 3 tablets day 4, 2 tablets day 5, then 1 tablet day 6, then stop 11/21/17   Saundra Shelling, MD  senna (SENOKOT) 8.6 MG TABS tablet Take 1 tablet by mouth daily as needed for mild constipation.    [provider]  spironolactone (ALDACTONE) 25 MG tablet Take 25 mg by mouth daily.    [provider]    Allergies Penicillins; Captopril; Enalapril maleate; Seldane  [terfenadine]; Tape; Augmentin [amoxicillin-pot clavulanate];  and Biaxin [clarithromycin]  Family History  Problem Relation Age of Onset  . Hypertension Mother   . Heart disease Mother   . Cancer Mother   . Stroke Father   . Hypertension Father   . Breast cancer Sister     Social History Social History   Tobacco Use  . Smoking status: Never Smoker  . Smokeless tobacco: Never Used  Substance Use Topics  . Alcohol use: No    Alcohol/week: 0.0 oz  . Drug use: No    Review of Systems  Constitutional: No fever/chills Eyes: No visual changes. ENT: No sore throat. Cardiovascular: Denies chest pain. Respiratory: Cough that is improving on steroids. Gastrointestinal: No abdominal pain.  No nausea, no vomiting.  No diarrhea.  No constipation. Genitourinary: Negative for dysuria. Musculoskeletal: Negative for back pain. Skin: Negative for rash. Neurological: Negative for headaches, focal weakness or numbness.   ____________________________________________   PHYSICAL EXAM:  VITAL SIGNS: ED Triage Vitals  Enc Vitals Group     BP 11/24/17 1343 (!) 184/106     Pulse Rate 11/24/17 1343 (!) 108     Resp 11/24/17 1343 18     Temp 11/24/17 1343 99.6 F (37.6 C)     Temp Source 11/24/17 1343 Oral     SpO2 11/24/17 1343 95 %     Weight 11/24/17 1345 162 lb (73.5 kg)     Height 11/24/17 1345 5\' 1"  (1.549 m)     Head Circumference --      Peak Flow --      Pain Score 11/24/17 1345 0     Pain Loc --      Pain Edu? --      Excl. in [place]? --     Constitutional: Alert and oriented. Well appearing and in no acute distress. Eyes: Conjunctivae are normal.  Head: Atraumatic. Nose: No congestion/rhinnorhea.  No active bleeding to the bilateral nares however the anterior septums do appear to be erythematous. Mouth/Throat: Mucous membranes are moist.  No bleeding to the posterior pharynx. Neck: No stridor.   Cardiovascular: Irregularly irregular rhythm with a rate between 80s and 1 teens. Grossly normal heart sounds.   Respiratory: Normal  respiratory effort.  No retractions. Lungs CTAB. Gastrointestinal: Soft and nontender. No distention. Musculoskeletal: No lower extremity tenderness nor edema.   Neurologic:  Normal speech and language. No gross focal neurologic deficits are appreciated. Skin:  Skin is warm, dry and intact. No rash noted. Psychiatric: Mood and affect are normal. Speech and behavior are normal.  ____________________________________________   LABS (all labs ordered are listed, but only abnormal results are displayed)  Labs Reviewed - No data to display ____________________________________________  EKG   ____________________________________________  RADIOLOGY   ____________________________________________   PROCEDURES  Procedure(s) performed:   Procedures  Critical Care performed:   ____________________________________________   INITIAL IMPRESSION / ASSESSMENT AND PLAN / ED COURSE  Pertinent labs & imaging results that were available during my care of the patient were reviewed by me and considered in my medical decision making (see chart for details).  DDX: Epistaxis, atrial fibrillation, steroid reaction, hypertension As part of my medical decision making, I reviewed the following data within the [place] Notes from prior ED visits and [place] Controlled Substance Database  ----------------------------------------- 3:40 PM on 11/24/2017 -----------------------------------------  Heart rate in the 80s and 90s at this time.  Placed antibacterial into the bilateral nares and the patient has not had any further nose bleeding.  Also given clonidine the blood pressures in the 170s over 100s at this time.  Patient to be discharged home.  The husband says that they have mupirocin at home and they will use this to the nares, bilaterally, twice a day.  Patient also to be given clonidine to be used as needed for blood pressure over 170 until the steroids are completed.  I feel that  this is likely a steroid reaction as the patient has had steroid reactions in the past.  I do not want to take her off her steroids prematurely to further exacerbate her breathing issues.  I believe the clonidine will be a temporary effect until she is completed her steroid course.  The patient as well as family are understanding of this plan willing to comply. ____________________________________________   FINAL CLINICAL IMPRESSION(S) / ED DIAGNOSES  Epistaxis.  Hypertension.  NEW MEDICATIONS STARTED DURING THIS VISIT:  New Prescriptions   No medications on file     Note:  This document was prepared using Dragon voice recognition software and may include unintentional dictation errors.     Orbie Pyo, MD 11/24/17 [phone]

## 2017-11-24 NOTE — ED Notes (Signed)
Unable to sign discharge paperwork due to topaz not working. IT called.

## 2017-11-24 NOTE — ED Triage Notes (Addendum)
Pt presents to ED via [facility] from home c/o epistaxis x20-25min PTA. Pt given x2 afrin by EMS PTA, no frank bleeding noted at this time. BP 190/104. Pt denies blood thinner use.

## 2018-01-20 ENCOUNTER — Ambulatory Visit
Admission: RE | Admit: 2018-01-20 | Discharge: 2018-01-20 | Disposition: A | Discharge status: Home / Self Care | Payer: Medicare Other | Source: Ambulatory Visit | Attending: Oncology | Admitting: Oncology

## 2018-01-20 DIAGNOSIS — C50411 Malignant neoplasm of upper-outer quadrant of right female breast: Secondary | ICD-10-CM | POA: Insufficient documentation

## 2018-03-21 NOTE — Progress Notes (Signed)
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  Leukocytosis.  INTERVAL HISTORY: Patient returns to clinic today as an add-on after being found to have a persistent leukocytosis along with a chronic cough of greater than 2 months.  She otherwise has felt well. She has no neurologic complaints.  She denies any recent fevers, but admits to recent episodes of pneumonia.  She has a good appetite and denies weight loss.  She denies any chest pain, hemoptysis, or shortness of breath.  She denies any nausea, vomiting, constipation , or diarrhea. She has no urinary complaints.  Patient offers no further specific complaints today.  REVIEW OF SYSTEMS:   Review of Systems  Constitutional: Negative.  Negative for fever, malaise/fatigue and weight loss.  Respiratory: Positive for shortness of breath. Negative for cough.   Cardiovascular: Negative.  Negative for chest pain and leg swelling.  Gastrointestinal: Negative.  Negative for abdominal pain, blood in stool and melena.  Genitourinary: Negative.  Negative for dysuria.  Musculoskeletal: Positive for back pain. Negative for joint pain.  Skin: Negative.  Negative for rash.  Neurological: Negative.  Negative for sensory change, focal weakness, weakness and headaches.  Psychiatric/Behavioral: Negative.  The patient is not nervous/anxious.     As per HPI. Otherwise, a complete review of systems is negative.  PAST MEDICAL HISTORY: Past Medical History:  Diagnosis Date  . Asthma   . Atrial fibrillation ([place])   . Breast cancer ([place]) 2011  . Bronchitis 04/2015  . Cancer [facility]) 2011   breast  . COPD (chronic obstructive pulmonary disease) ([place])   .  Hypertension   . Shingles    10/2015    PAST SURGICAL HISTORY: Past Surgical History:  Procedure Laterality Date  . BREAST EXCISIONAL BIOPSY Right 11/29/13   two areas FAT NECROSIS  . BREAST EXCISIONAL BIOPSY Right 10/30/2009   lumpectomy - radiation    FAMILY HISTORY Family History  Problem Relation Age of Onset  . Hypertension Mother   . Heart disease Mother   . Cancer Mother   . Stroke Father   . Hypertension Father   . Breast cancer Sister        ADVANCED DIRECTIVES:    HEALTH MAINTENANCE: Social History   Tobacco Use  . Smoking status: Never Smoker  . Smokeless tobacco: Never Used  Substance Use Topics  . Alcohol use: No    Alcohol/week: 0.0 standard drinks  . Drug use: No     Colonoscopy:  PAP:  Bone density:  Lipid panel:  Allergies  Allergen Reactions  . Diphenhydramine Hcl Rash  . Penicillins Hives    .Has patient had a PCN reaction causing immediate rash, facial/tongue/throat swelling, SOB or lightheadedness with hypotension: Unknown Has patient had a PCN reaction causing severe rash involving mucus membranes or skin necrosis: Unknown Has patient had a PCN reaction that required hospitalization: Unknown Has patient had a PCN reaction occurring within the last 10 years: Unknown If all of the above answers are "NO", then may proceed with Cephalosporin use.   . Captopril Other (See Comments)  . Enalapril Maleate     Other reaction(s): Unknown  . Tape Itching  . Terfenadine Other (See Comments)  . Augmentin [Amoxicillin-Pot Clavulanate] Rash  . Biaxin [Clarithromycin]  Rash, Other (See Comments) and Hives    Other reaction(s): Unknown    Current Outpatient Medications  Medication Sig Dispense Refill  . acetaminophen (TYLENOL) 325 MG tablet Take 2 tablets (650 mg total) by mouth every 6 (six) hours as needed for mild pain (or Fever >/= 101).    Marland Kitchen albuterol (PROVENTIL HFA;VENTOLIN HFA) 108 (90 Base) MCG/ACT inhaler Inhale 1-2 puffs into the lungs  every 6 (six) hours as needed for wheezing or shortness of breath.    . ALPRAZolam (XANAX) 0.25 MG tablet Take 1 tablet by mouth every morning.    Marland Kitchen aspirin 81 MG tablet Take 81 mg by mouth daily.     Marland Kitchen atorvastatin (LIPITOR) 20 MG tablet Take 20 mg by mouth daily.    . cholecalciferol (VITAMIN D) 1000 units tablet Take 1,000 Units by mouth daily.    Marland Kitchen diltiazem (DILACOR XR) 120 MG 24 hr capsule Take 120 mg by mouth daily.    Marland Kitchen esomeprazole (NEXIUM) 40 MG capsule Take 40 mg by mouth 2 (two) times daily before a meal.     . ferrous sulfate 325 (65 FE) MG tablet Take 325 mg by mouth daily with breakfast.    . fluticasone (FLONASE) 50 MCG/ACT nasal spray Place 2 sprays into both nostrils daily.     . Fluticasone-Salmeterol (ADVAIR) 250-50 MCG/DOSE AEPB Inhale 1 puff into the lungs 2 (two) times daily.    . hydrALAZINE (APRESOLINE) 25 MG tablet Take 1 tablet (25 mg total) by mouth every 6 (six) hours.    Marland Kitchen HYDROcodone-acetaminophen (NORCO/VICODIN) 5-325 MG tablet Take 1 tablet by mouth as needed.    Marland Kitchen levothyroxine (SYNTHROID, LEVOTHROID) 75 MCG tablet Take 1 tablet (75 mcg total) by mouth daily before breakfast.    . magnesium oxide (MAG-OX) 400 MG tablet Take 400 mg by mouth 2 (two) times daily.    . montelukast (SINGULAIR) 10 MG tablet Take 10 mg by mouth daily.    . ondansetron (ZOFRAN) 4 MG tablet Take 1 tablet by mouth daily.    . cloNIDine (CATAPRES) 0.1 MG tablet Take 1 tablet (0.1 mg total) by mouth 2 (two) times daily as needed (Take as needed for systolic blood pressure (top number) over 170.  Hold if diastolic (bottom number) is below 70.). (Patient not taking: Reported on 03/24/2018) 10 tablet 0  . docusate sodium (COLACE) 100 MG capsule Take 1 capsule (100 mg total) by mouth daily. (Patient not taking: Reported on 05/15/2017) 10 capsule 0  . guaiFENesin (MUCINEX) 600 MG 12 hr tablet Take 1 tablet (600 mg total) by mouth 2 (two) times daily. (Patient not taking: Reported on 03/24/2018) 20  tablet 0  . senna (SENOKOT) 8.6 MG TABS tablet Take 1 tablet by mouth daily as needed for mild constipation.    Marland Kitchen spironolactone (ALDACTONE) 25 MG tablet Take 25 mg by mouth daily.     No current facility-administered medications for this visit.     OBJECTIVE: Vitals:   03/24/18 1549  BP: 139/79  Pulse: 82  Resp: (!) 22  Temp: (!) 97.1 F (36.2 C)     Body mass index is 30.16 kg/m.    ECOG FS:0 - Asymptomatic  General: Well-developed, well-nourished, no acute distress. Eyes: Pink conjunctiva, anicteric sclera. HEENT: Normocephalic, moist mucous membranes, clear oropharnyx. Lungs: Clear to auscultation bilaterally. Heart: Regular rate and rhythm. No rubs, murmurs, or gallops. Abdomen: Soft, nontender, nondistended. No organomegaly noted, normoactive bowel sounds. Musculoskeletal: No edema, cyanosis, or clubbing. Neuro: Alert, answering all  questions appropriately. Cranial nerves grossly intact. Skin: No rashes or petechiae noted. Psych: Normal affect. Lymphatics: No cervical, calvicular, axillary or inguinal LAD.   LAB RESULTS:  Lab Results  Component Value Date   NA 130 (L) 03/24/2018   K 4.5 03/24/2018   CL 95 (L) 03/24/2018   CO2 25 03/24/2018   GLUCOSE 107 (H) 03/24/2018   BUN 20 03/24/2018   CREATININE 0.86 03/24/2018   CALCIUM 10.0 03/24/2018   PROT 6.8 11/16/2017   ALBUMIN 3.0 (L) 11/16/2017   AST 47 (H) 11/16/2017   ALT 41 11/16/2017   ALKPHOS 57 11/16/2017   BILITOT 1.2 11/16/2017   GFRNONAA >60 03/24/2018   GFRAA >60 03/24/2018    Lab Results  Component Value Date   WBC 11.3 (H) 03/24/2018   NEUTROABS 7.0 (H) 03/24/2018   HGB 12.0 03/24/2018   HCT 35.2 03/24/2018   MCV 87.0 03/24/2018   PLT 386 03/24/2018     STUDIES: No results found.  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  Leukocytosis.   PLAN:    1. Stage 1b ER/PR positive, HER-2 negative adenocarcinoma of  the upper outer quadrant of the right breast: Patient completed 5 years of adjuvant letrozole in August 2016.  Her most recent mammogram on January 20, 2018 was reported as BI-RADS 2, repeat in June 2020.  Return to clinic as previously scheduled for routine evaluation. 2. Bone mineral density: Bone mineral density from May 2015 was reported as normal with a T score of -1.0. Now the patient has discontinued her aromatase inhibitor, this can be monitored by her primary care physician. 3.  Leukocytosis: Patient's white blood cell count is 11.3 today.  Likely reactive.  We will continue to monitor consider additional work-up is not normalized in the near future.  Patient does not require bone marrow biopsy at this time. 4.  Persistent cough: Patient reports she has had a persistent cough for the greater than 2 months.  We will get a CT of the chest for further evaluation. 5.  Disposition: Return to clinic in 2 weeks to discuss her CT scan results.   Patient expressed understanding and was in agreement with this plan. She also understands that She can call clinic at any time with any questions, concerns, or complaints.    Lloyd Huger, MD   03/28/2018 8:38 AM

## 2018-03-22 ENCOUNTER — Other Ambulatory Visit: Payer: Self-pay | Admitting: *Deleted

## 2018-03-22 DIAGNOSIS — E871 Hypo-osmolality and hyponatremia: Secondary | ICD-10-CM

## 2018-03-22 DIAGNOSIS — D72829 Elevated white blood cell count, unspecified: Secondary | ICD-10-CM

## 2018-03-24 ENCOUNTER — Other Ambulatory Visit: Payer: Self-pay

## 2018-03-24 ENCOUNTER — Inpatient Hospital Stay (HOSPITAL_BASED_OUTPATIENT_CLINIC_OR_DEPARTMENT_OTHER): Payer: Medicare Other | Admitting: Oncology

## 2018-03-24 ENCOUNTER — Inpatient Hospital Stay: Payer: Medicare Other | Attending: Oncology

## 2018-03-24 VITALS — BP 139/79 | HR 82 | Temp 97.1°F | Resp 22 | Ht 61.0 in | Wt 159.6 lb

## 2018-03-24 DIAGNOSIS — R05 Cough: Secondary | ICD-10-CM | POA: Diagnosis not present

## 2018-03-24 DIAGNOSIS — I10 Essential (primary) hypertension: Secondary | ICD-10-CM

## 2018-03-24 DIAGNOSIS — D72829 Elevated white blood cell count, unspecified: Secondary | ICD-10-CM | POA: Insufficient documentation

## 2018-03-24 DIAGNOSIS — Z853 Personal history of malignant neoplasm of breast: Secondary | ICD-10-CM | POA: Insufficient documentation

## 2018-03-24 DIAGNOSIS — Z923 Personal history of irradiation: Secondary | ICD-10-CM

## 2018-03-24 DIAGNOSIS — Z170 Estrogen receptor positive status [ER+]: Secondary | ICD-10-CM | POA: Diagnosis not present

## 2018-03-24 DIAGNOSIS — Z803 Family history of malignant neoplasm of breast: Secondary | ICD-10-CM | POA: Insufficient documentation

## 2018-03-24 DIAGNOSIS — E871 Hypo-osmolality and hyponatremia: Secondary | ICD-10-CM

## 2018-03-24 DIAGNOSIS — Z809 Family history of malignant neoplasm, unspecified: Secondary | ICD-10-CM | POA: Insufficient documentation

## 2018-03-24 DIAGNOSIS — C50411 Malignant neoplasm of upper-outer quadrant of right female breast: Secondary | ICD-10-CM

## 2018-03-24 LAB — BASIC METABOLIC PANEL
Anion gap: 10 (ref 5–15)
BUN: 20 mg/dL (ref 8–23)
CALCIUM: 10 mg/dL (ref 8.9–10.3)
CO2: 25 mmol/L (ref 22–32)
CREATININE: 0.86 mg/dL (ref 0.44–1.00)
Chloride: 95 mmol/L — ABNORMAL LOW (ref 98–111)
GFR calc Af Amer: >60 mL/min (ref >60)
GFR calc non Af Amer: >60 mL/min (ref >60)
Glucose, Bld: 107 mg/dL — ABNORMAL HIGH (ref 70–99)
Potassium: 4.5 mmol/L (ref 3.5–5.1)
SODIUM: 130 mmol/L — AB (ref 135–145)

## 2018-03-24 LAB — CBC WITH DIFFERENTIAL/PLATELET
BASOS PCT: 1 %
Basophils Absolute: 0.1 10*3/uL (ref 0–0.1)
EOS ABS: 1.2 10*3/uL — AB (ref 0–0.7)
Eosinophils Relative: 10 %
HCT: 35.2 % (ref 35.0–47.0)
Hemoglobin: 12 g/dL (ref 12.0–16.0)
Lymphocytes Relative: 16 %
Lymphs Abs: 1.8 10*3/uL (ref 1.0–3.6)
MCH: 29.6 pg (ref 26.0–34.0)
MCHC: 34.1 g/dL (ref 32.0–36.0)
MCV: 87 fL (ref 80.0–100.0)
MONOS PCT: 11 %
Monocytes Absolute: 1.2 10*3/uL — ABNORMAL HIGH (ref 0.2–0.9)
Neutro Abs: 7 10*3/uL — ABNORMAL HIGH (ref 1.4–6.5)
Neutrophils Relative %: 62 %
Platelets: 386 10*3/uL (ref 150–440)
RBC: 4.04 MIL/uL (ref 3.80–5.20)
RDW: 16.4 % — ABNORMAL HIGH (ref 11.5–14.5)
WBC: 11.3 10*3/uL — AB (ref 3.6–11.0)

## 2018-03-29 ENCOUNTER — Ambulatory Visit
Admission: RE | Admit: 2018-03-29 | Discharge: 2018-03-29 | Disposition: A | Discharge status: Home / Self Care | Payer: Medicare Other | Source: Ambulatory Visit | Attending: Oncology | Admitting: Oncology

## 2018-03-29 DIAGNOSIS — N200 Calculus of kidney: Secondary | ICD-10-CM | POA: Insufficient documentation

## 2018-03-29 DIAGNOSIS — C50411 Malignant neoplasm of upper-outer quadrant of right female breast: Secondary | ICD-10-CM | POA: Insufficient documentation

## 2018-03-29 DIAGNOSIS — I700 Atherosclerosis of aorta: Secondary | ICD-10-CM | POA: Diagnosis not present

## 2018-03-29 DIAGNOSIS — J439 Emphysema, unspecified: Secondary | ICD-10-CM | POA: Insufficient documentation

## 2018-03-29 MED ORDER — IOHEXOL 300 MG/ML  SOLN
100.0000 mL | Freq: Once | INTRAMUSCULAR | Status: AC | PRN
  Administered 2018-03-29: 75 mL via INTRAVENOUS

## 2018-04-05 ENCOUNTER — Other Ambulatory Visit: Payer: Self-pay | Admitting: Oncology

## 2018-04-05 DIAGNOSIS — D72829 Elevated white blood cell count, unspecified: Secondary | ICD-10-CM | POA: Insufficient documentation

## 2018-04-05 NOTE — Progress Notes (Deleted)
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  Leukocytosis.  INTERVAL HISTORY: Patient returns to clinic today as an add-on after being found to have a persistent leukocytosis along with a chronic cough of greater than 2 months.  She otherwise has felt well. She has no neurologic complaints.  She denies any recent fevers, but admits to recent episodes of pneumonia.  She has a good appetite and denies weight loss.  She denies any chest pain, hemoptysis, or shortness of breath.  She denies any nausea, vomiting, constipation , or diarrhea. She has no urinary complaints.  Patient offers no further specific complaints today.  REVIEW OF SYSTEMS:   Review of Systems  Constitutional: Negative.  Negative for fever, malaise/fatigue and weight loss.  Respiratory: Positive for shortness of breath. Negative for cough.   Cardiovascular: Negative.  Negative for chest pain and leg swelling.  Gastrointestinal: Negative.  Negative for abdominal pain, blood in stool and melena.  Genitourinary: Negative.  Negative for dysuria.  Musculoskeletal: Positive for back pain. Negative for joint pain.  Skin: Negative.  Negative for rash.  Neurological: Negative.  Negative for sensory change, focal weakness, weakness and headaches.  Psychiatric/Behavioral: Negative.  The patient is not nervous/anxious.     As per HPI. Otherwise, a complete review of systems is negative.  PAST MEDICAL HISTORY: Past Medical History:  Diagnosis Date  . Asthma   . Atrial fibrillation ([place])   . Breast cancer ([place]) 2011  . Bronchitis 04/2015  . Cancer [facility]) 2011   breast  . COPD (chronic obstructive pulmonary disease) ([place])   .  Hypertension   . Shingles    10/2015    PAST SURGICAL HISTORY: Past Surgical History:  Procedure Laterality Date  . BREAST EXCISIONAL BIOPSY Right 11/29/13   two areas FAT NECROSIS  . BREAST EXCISIONAL BIOPSY Right 10/30/2009   lumpectomy - radiation    FAMILY HISTORY Family History  Problem Relation Age of Onset  . Hypertension Mother   . Heart disease Mother   . Cancer Mother   . Stroke Father   . Hypertension Father   . Breast cancer Sister        ADVANCED DIRECTIVES:    HEALTH MAINTENANCE: Social History   Tobacco Use  . Smoking status: Never Smoker  . Smokeless tobacco: Never Used  Substance Use Topics  . Alcohol use: No    Alcohol/week: 0.0 standard drinks  . Drug use: No     Colonoscopy:  PAP:  Bone density:  Lipid panel:  Allergies  Allergen Reactions  . Diphenhydramine Hcl Rash  . Penicillins Hives    .Has patient had a PCN reaction causing immediate rash, facial/tongue/throat swelling, SOB or lightheadedness with hypotension: Unknown Has patient had a PCN reaction causing severe rash involving mucus membranes or skin necrosis: Unknown Has patient had a PCN reaction that required hospitalization: Unknown Has patient had a PCN reaction occurring within the last 10 years: Unknown If all of the above answers are "NO", then may proceed with Cephalosporin use.   . Captopril Other (See Comments)  . Enalapril Maleate     Other reaction(s): Unknown  . Tape Itching  . Terfenadine Other (See Comments)  . Augmentin [Amoxicillin-Pot Clavulanate] Rash  . Biaxin [Clarithromycin]  Rash, Other (See Comments) and Hives    Other reaction(s): Unknown    Current Outpatient Medications  Medication Sig Dispense Refill  . acetaminophen (TYLENOL) 325 MG tablet Take 2 tablets (650 mg total) by mouth every 6 (six) hours as needed for mild pain (or Fever >/= 101).    Marland Kitchen albuterol (PROVENTIL HFA;VENTOLIN HFA) 108 (90 Base) MCG/ACT inhaler Inhale 1-2 puffs into the lungs  every 6 (six) hours as needed for wheezing or shortness of breath.    . ALPRAZolam (XANAX) 0.25 MG tablet Take 1 tablet by mouth every morning.    Marland Kitchen aspirin 81 MG tablet Take 81 mg by mouth daily.     Marland Kitchen atorvastatin (LIPITOR) 20 MG tablet Take 20 mg by mouth daily.    . cholecalciferol (VITAMIN D) 1000 units tablet Take 1,000 Units by mouth daily.    . cloNIDine (CATAPRES) 0.1 MG tablet Take 1 tablet (0.1 mg total) by mouth 2 (two) times daily as needed (Take as needed for systolic blood pressure (top number) over 170.  Hold if diastolic (bottom number) is below 70.). (Patient not taking: Reported on 03/24/2018) 10 tablet 0  . diltiazem (DILACOR XR) 120 MG 24 hr capsule Take 120 mg by mouth daily.    Marland Kitchen docusate sodium (COLACE) 100 MG capsule Take 1 capsule (100 mg total) by mouth daily. (Patient not taking: Reported on 05/15/2017) 10 capsule 0  . esomeprazole (NEXIUM) 40 MG capsule Take 40 mg by mouth 2 (two) times daily before a meal.     . ferrous sulfate 325 (65 FE) MG tablet Take 325 mg by mouth daily with breakfast.    . fluticasone (FLONASE) 50 MCG/ACT nasal spray Place 2 sprays into both nostrils daily.     . Fluticasone-Salmeterol (ADVAIR) 250-50 MCG/DOSE AEPB Inhale 1 puff into the lungs 2 (two) times daily.    Marland Kitchen guaiFENesin (MUCINEX) 600 MG 12 hr tablet Take 1 tablet (600 mg total) by mouth 2 (two) times daily. (Patient not taking: Reported on 03/24/2018) 20 tablet 0  . hydrALAZINE (APRESOLINE) 25 MG tablet Take 1 tablet (25 mg total) by mouth every 6 (six) hours.    Marland Kitchen HYDROcodone-acetaminophen (NORCO/VICODIN) 5-325 MG tablet Take 1 tablet by mouth as needed.    Marland Kitchen levothyroxine (SYNTHROID, LEVOTHROID) 75 MCG tablet Take 1 tablet (75 mcg total) by mouth daily before breakfast.    . magnesium oxide (MAG-OX) 400 MG tablet Take 400 mg by mouth 2 (two) times daily.    . montelukast (SINGULAIR) 10 MG tablet Take 10 mg by mouth daily.    . ondansetron (ZOFRAN) 4 MG tablet Take 1 tablet by mouth  daily.    Marland Kitchen senna (SENOKOT) 8.6 MG TABS tablet Take 1 tablet by mouth daily as needed for mild constipation.    Marland Kitchen spironolactone (ALDACTONE) 25 MG tablet Take 25 mg by mouth daily.     No current facility-administered medications for this visit.     OBJECTIVE: There were no vitals filed for this visit.   There is no height or weight on file to calculate BMI.    ECOG FS:0 - Asymptomatic  General: Well-developed, well-nourished, no acute distress. Eyes: Pink conjunctiva, anicteric sclera. HEENT: Normocephalic, moist mucous membranes, clear oropharnyx. Lungs: Clear to auscultation bilaterally. Heart: Regular rate and rhythm. No rubs, murmurs, or gallops. Abdomen: Soft, nontender, nondistended. No organomegaly noted, normoactive bowel sounds. Musculoskeletal: No edema, cyanosis, or clubbing. Neuro: Alert, answering all questions appropriately. Cranial nerves grossly intact. Skin: No rashes or petechiae  noted. Psych: Normal affect. Lymphatics: No cervical, calvicular, axillary or inguinal LAD.   LAB RESULTS:  Lab Results  Component Value Date   NA 130 (L) 03/24/2018   K 4.5 03/24/2018   CL 95 (L) 03/24/2018   CO2 25 03/24/2018   GLUCOSE 107 (H) 03/24/2018   BUN 20 03/24/2018   CREATININE 0.86 03/24/2018   CALCIUM 10.0 03/24/2018   PROT 6.8 11/16/2017   ALBUMIN 3.0 (L) 11/16/2017   AST 47 (H) 11/16/2017   ALT 41 11/16/2017   ALKPHOS 57 11/16/2017   BILITOT 1.2 11/16/2017   GFRNONAA >60 03/24/2018   GFRAA >60 03/24/2018    Lab Results  Component Value Date   WBC 11.3 (H) 03/24/2018   NEUTROABS 7.0 (H) 03/24/2018   HGB 12.0 03/24/2018   HCT 35.2 03/24/2018   MCV 87.0 03/24/2018   PLT 386 03/24/2018     STUDIES: Ct Chest W Contrast  Result Date: 03/29/2018 CLINICAL DATA:  Right breast cancer diagnosed in 2011 with lumpectomy and radiation therapy. Recent hospitalization for pneumonia. Chronic cough. EXAM: CT CHEST WITH CONTRAST TECHNIQUE: Multidetector CT imaging of  the chest was performed during intravenous contrast administration. CONTRAST:  33m OMNIPAQUE IOHEXOL 300 MG/ML  SOLN COMPARISON:  Radiographs 11/17/2017 and 11/15/2017.  CT 03/29/2017. FINDINGS: Cardiovascular: Atherosclerosis of the aorta, great vessels and coronary arteries. No acute vascular findings. The heart remains mildly enlarged. No significant pericardial effusion. Mediastinum/Nodes: Multiple small mediastinal and hilar lymph nodes are stable. The thyroid gland, trachea and esophagus demonstrate no significant findings. Lungs/Pleura: The right pleural effusion has resolved. There is interval improved aeration of the right lung base. There is moderate emphysema with central airway thickening and subpleural reticulation throughout both lungs. No suspicious pulmonary nodule or confluent airspace opacity. Upper abdomen: Mild contour irregularity of the liver, suggesting cirrhosis. There is no significant residual reflux of contrast into the IVC or hepatic veins. There is a small nonobstructing calculus in the interpolar region of the right kidney (image 162/2). Musculoskeletal/Chest wall: There is no chest wall mass or suspicious osseous finding. Left shoulder arthroplasty noted. IMPRESSION: 1. No acute findings or evidence of pneumonia. 2. Interval resolution of right pleural effusion with improved aeration of the right lung base. 3. Stable mildly prominent mediastinal and hilar lymph nodes bilaterally, likely reactive. 4. Aortic Atherosclerosis (ICD10-I70.0) and Emphysema (ICD10-J43.9). 5. Mild contour irregularity of the liver suggesting possible cirrhosis. Based on previous findings, this could be due to chronic passive congestion. 6. Nonobstructing small right renal calculus. Electronically Signed   By: WRichardean SaleM.D.   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  Leukocytosis.   PLAN:    1.  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  Her most recent mammogram on January 20, 2018 was reported as BI-RADS 2, repeat in June 2020.  Return to clinic as previously scheduled for routine evaluation. 2. Bone mineral density: Bone mineral density from May 2015 was reported as normal with a T score of -1.0. Now the patient has discontinued her aromatase inhibitor, this can be monitored by her primary care physician. 3.  Leukocytosis: Patient's white blood cell count is 11.3 today.  Likely reactive.  We will continue to monitor consider additional work-up is not normalized in the near future.  Patient does not require  bone marrow biopsy at this time. 4.  Persistent cough: Patient reports she has had a persistent cough for the greater than 2 months.  We will get a CT of the chest for further evaluation. 5.  Disposition: Return to clinic in 2 weeks to discuss her CT scan results.   Patient expressed understanding and was in agreement with this plan. She also understands that She can call clinic at any time with any questions, concerns, or complaints.    Lloyd Huger, MD   04/05/2018 10:35 PM

## 2018-04-06 ENCOUNTER — Inpatient Hospital Stay: Payer: Medicare Other | Admitting: Oncology

## 2018-04-06 ENCOUNTER — Inpatient Hospital Stay: Payer: Medicare Other

## 2018-04-07 ENCOUNTER — Other Ambulatory Visit: Payer: Medicare Other

## 2018-04-07 ENCOUNTER — Ambulatory Visit: Payer: Medicare Other | Admitting: Oncology

## 2018-04-10 NOTE — Progress Notes (Signed)
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  Leukocytosis.  INTERVAL HISTORY: Patient returns to clinic today for further evaluation and discussion of her imaging results.  She continues to have a chronic cough, but states this has improved.  She also has multiple other medical complaints that are chronic and unchanged in nature.  She has no neurologic complaints.  She denies any recent fevers.  She has a good appetite and denies weight loss.  She denies any chest pain, hemoptysis, or shortness of breath.  She denies any nausea, vomiting, constipation , or diarrhea. She has no urinary complaints.  Patient offers no further specific complaints today.  REVIEW OF SYSTEMS:   Review of Systems  Constitutional: Negative.  Negative for fever, malaise/fatigue and weight loss.  Respiratory: Positive for cough and shortness of breath.   Cardiovascular: Negative.  Negative for chest pain and leg swelling.  Gastrointestinal: Negative.  Negative for abdominal pain, blood in stool and melena.  Genitourinary: Negative.  Negative for dysuria.  Musculoskeletal: Positive for back pain and joint pain.  Skin: Negative.  Negative for rash.  Neurological: Negative.  Negative for sensory change, focal weakness, weakness and headaches.  Psychiatric/Behavioral: Negative.  The patient is not nervous/anxious.     As per HPI. Otherwise, a complete review of systems is negative.  PAST MEDICAL HISTORY: Past Medical History:  Diagnosis Date  . Asthma   . Atrial fibrillation ([place])   . Breast cancer ([place]) 2011  . Bronchitis 04/2015  . Cancer [facility]) 2011   breast  . COPD (chronic obstructive pulmonary disease) ([place])   .  Hypertension   . Shingles    10/2015    PAST SURGICAL HISTORY: Past Surgical History:  Procedure Laterality Date  . BREAST EXCISIONAL BIOPSY Right 11/29/13   two areas FAT NECROSIS  . BREAST EXCISIONAL BIOPSY Right 10/30/2009   lumpectomy - radiation    FAMILY HISTORY Family History  Problem Relation Age of Onset  . Hypertension Mother   . Heart disease Mother   . Cancer Mother   . Stroke Father   . Hypertension Father   . Breast cancer Sister        ADVANCED DIRECTIVES:    HEALTH MAINTENANCE: Social History   Tobacco Use  . Smoking status: Never Smoker  . Smokeless tobacco: Never Used  Substance Use Topics  . Alcohol use: No    Alcohol/week: 0.0 standard drinks  . Drug use: No     Colonoscopy:  PAP:  Bone density:  Lipid panel:  Allergies  Allergen Reactions  . Diphenhydramine Hcl Rash  . Penicillins Hives    .Has patient had a PCN reaction causing immediate rash, facial/tongue/throat swelling, SOB or lightheadedness with hypotension: Unknown Has patient had a PCN reaction causing severe rash involving mucus membranes or skin necrosis: Unknown Has patient had a PCN reaction that required hospitalization: Unknown Has patient had a PCN reaction occurring within the last 10 years: Unknown If all of the above answers are "NO", then may proceed with Cephalosporin use.   . Captopril Other (See Comments)  . Enalapril Maleate     Other reaction(s): Unknown  . Tape Itching  . Terfenadine Other (See Comments)  . Augmentin [Amoxicillin-Pot Clavulanate] Rash  .  Biaxin [Clarithromycin] Rash, Other (See Comments) and Hives    Other reaction(s): Unknown    Current Outpatient Medications  Medication Sig Dispense Refill  . ALPRAZolam (XANAX) 0.25 MG tablet Take 1 tablet by mouth every morning.    Marland Kitchen aspirin 81 MG tablet Take 81 mg by mouth daily.     Marland Kitchen atorvastatin (LIPITOR) 20 MG tablet Take 20 mg by mouth daily.    . cholecalciferol (VITAMIN D) 1000 units tablet  Take 1,000 Units by mouth daily.    Marland Kitchen diltiazem (DILACOR XR) 120 MG 24 hr capsule Take 120 mg by mouth daily.    Marland Kitchen esomeprazole (NEXIUM) 40 MG capsule Take 40 mg by mouth 2 (two) times daily before a meal.     . ferrous sulfate 325 (65 FE) MG tablet Take 325 mg by mouth daily with breakfast.    . fluticasone (FLONASE) 50 MCG/ACT nasal spray Place 2 sprays into both nostrils daily.     . Fluticasone-Salmeterol (ADVAIR) 250-50 MCG/DOSE AEPB Inhale 1 puff into the lungs 2 (two) times daily.    . hydrALAZINE (APRESOLINE) 25 MG tablet Take 1 tablet (25 mg total) by mouth every 6 (six) hours.    Marland Kitchen HYDROcodone-acetaminophen (NORCO/VICODIN) 5-325 MG tablet Take 1 tablet by mouth as needed.    Marland Kitchen levothyroxine (SYNTHROID, LEVOTHROID) 75 MCG tablet Take 1 tablet (75 mcg total) by mouth daily before breakfast.    . magnesium oxide (MAG-OX) 400 MG tablet Take 400 mg by mouth 2 (two) times daily.    . montelukast (SINGULAIR) 10 MG tablet Take 10 mg by mouth daily.    Marland Kitchen spironolactone (ALDACTONE) 25 MG tablet Take 25 mg by mouth daily.    Marland Kitchen acetaminophen (TYLENOL) 325 MG tablet Take 2 tablets (650 mg total) by mouth every 6 (six) hours as needed for mild pain (or Fever >/= 101). (Patient not taking: Reported on 04/11/2018)    . albuterol (PROVENTIL HFA;VENTOLIN HFA) 108 (90 Base) MCG/ACT inhaler Inhale 1-2 puffs into the lungs every 6 (six) hours as needed for wheezing or shortness of breath.    . cloNIDine (CATAPRES) 0.1 MG tablet Take 1 tablet (0.1 mg total) by mouth 2 (two) times daily as needed (Take as needed for systolic blood pressure (top number) over 170.  Hold if diastolic (bottom number) is below 70.). (Patient not taking: Reported on 03/24/2018) 10 tablet 0  . docusate sodium (COLACE) 100 MG capsule Take 1 capsule (100 mg total) by mouth daily. (Patient not taking: Reported on 04/11/2018) 10 capsule 0  . guaiFENesin (MUCINEX) 600 MG 12 hr tablet Take 1 tablet (600 mg total) by mouth 2 (two) times daily.  (Patient not taking: Reported on 03/24/2018) 20 tablet 0  . ondansetron (ZOFRAN) 4 MG tablet Take 1 tablet by mouth daily.    Marland Kitchen senna (SENOKOT) 8.6 MG TABS tablet Take 1 tablet by mouth daily as needed for mild constipation.     No current facility-administered medications for this visit.     OBJECTIVE: Vitals:   04/11/18 1025  BP: (!) 143/69  Pulse: 76  Resp: 18  Temp: (!) 96.8 F (36 C)     Body mass index is 28.95 kg/m.    ECOG FS:0 - Asymptomatic  General: Well-developed, well-nourished, no acute distress. Eyes: Pink conjunctiva, anicteric sclera. HEENT: Normocephalic, moist mucous membranes. Lungs: Clear to auscultation bilaterally. Heart: Regular rate and rhythm. No rubs, murmurs, or gallops. Abdomen: Soft, nontender, nondistended. No organomegaly noted, normoactive bowel sounds. Musculoskeletal: No edema, cyanosis,  or clubbing. Neuro: Alert, answering all questions appropriately. Cranial nerves grossly intact. Skin: No rashes or petechiae noted. Psych: Normal affect.  LAB RESULTS:  Lab Results  Component Value Date   NA 130 (L) 03/24/2018   K 4.5 03/24/2018   CL 95 (L) 03/24/2018   CO2 25 03/24/2018   GLUCOSE 107 (H) 03/24/2018   BUN 20 03/24/2018   CREATININE 0.86 03/24/2018   CALCIUM 10.0 03/24/2018   PROT 6.8 11/16/2017   ALBUMIN 3.0 (L) 11/16/2017   AST 47 (H) 11/16/2017   ALT 41 11/16/2017   ALKPHOS 57 11/16/2017   BILITOT 1.2 11/16/2017   GFRNONAA >60 03/24/2018   GFRAA >60 03/24/2018    Lab Results  Component Value Date   WBC 11.3 (H) 03/24/2018   NEUTROABS 7.0 (H) 03/24/2018   HGB 12.0 03/24/2018   HCT 35.2 03/24/2018   MCV 87.0 03/24/2018   PLT 386 03/24/2018     STUDIES: Ct Chest W Contrast  Result Date: 03/29/2018 CLINICAL DATA:  Right breast cancer diagnosed in 2011 with lumpectomy and radiation therapy. Recent hospitalization for pneumonia. Chronic cough. EXAM: CT CHEST WITH CONTRAST TECHNIQUE: Multidetector CT imaging of the chest  was performed during intravenous contrast administration. CONTRAST:  57m OMNIPAQUE IOHEXOL 300 MG/ML  SOLN COMPARISON:  Radiographs 11/17/2017 and 11/15/2017.  CT 03/29/2017. FINDINGS: Cardiovascular: Atherosclerosis of the aorta, great vessels and coronary arteries. No acute vascular findings. The heart remains mildly enlarged. No significant pericardial effusion. Mediastinum/Nodes: Multiple small mediastinal and hilar lymph nodes are stable. The thyroid gland, trachea and esophagus demonstrate no significant findings. Lungs/Pleura: The right pleural effusion has resolved. There is interval improved aeration of the right lung base. There is moderate emphysema with central airway thickening and subpleural reticulation throughout both lungs. No suspicious pulmonary nodule or confluent airspace opacity. Upper abdomen: Mild contour irregularity of the liver, suggesting cirrhosis. There is no significant residual reflux of contrast into the IVC or hepatic veins. There is a small nonobstructing calculus in the interpolar region of the right kidney (image 162/2). Musculoskeletal/Chest wall: There is no chest wall mass or suspicious osseous finding. Left shoulder arthroplasty noted. IMPRESSION: 1. No acute findings or evidence of pneumonia. 2. Interval resolution of right pleural effusion with improved aeration of the right lung base. 3. Stable mildly prominent mediastinal and hilar lymph nodes bilaterally, likely reactive. 4. Aortic Atherosclerosis (ICD10-I70.0) and Emphysema (ICD10-J43.9). 5. Mild contour irregularity of the liver suggesting possible cirrhosis. Based on previous findings, this could be due to chronic passive congestion. 6. Nonobstructing small right renal calculus. Electronically Signed   By: WRichardean SaleM.D.   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  Leukocytosis.   PLAN:    1. Stage 1b  ER/PR positive, HER-2 negative adenocarcinoma of the upper outer quadrant of the right breast: Patient completed 5 years of adjuvant letrozole in August 2016.  Her most recent mammogram on January 20, 2018 was reported as BI-RADS 2, repeat in June 2020.  Return to clinic in June 2020 after her mammogram for routine evaluation.   2. Bone mineral density: Bone mineral density from May 2015 was reported as normal with a T score of -1.0. Now the patient has discontinued her aromatase inhibitor, this can be monitored by her primary care physician. 3.  Leukocytosis: Previously, patient's white blood cell count was only mildly elevated at 11.3.  This is most likely reactive.  CT scan results from March 29, 2018 reviewed independently and report as above with no obvious evidence of infection or pneumonia.   4.  Persistent cough: CT scan results as above.  No obvious evidence of malignancy. 5.  Disposition: Return to clinic as previously scheduled in June 2020 for routine follow-up for her breast cancer.   Patient expressed understanding and was in agreement with this plan. She also understands that She can call clinic at any time with any questions, concerns, or complaints.    Lloyd Huger, MD   04/12/2018 1:43 PM

## 2018-04-11 ENCOUNTER — Inpatient Hospital Stay (HOSPITAL_BASED_OUTPATIENT_CLINIC_OR_DEPARTMENT_OTHER): Payer: Medicare Other | Admitting: Oncology

## 2018-04-11 ENCOUNTER — Inpatient Hospital Stay: Payer: Medicare Other

## 2018-04-11 ENCOUNTER — Other Ambulatory Visit: Payer: Self-pay

## 2018-04-11 VITALS — BP 143/69 | HR 76 | Temp 96.8°F | Resp 18 | Wt 153.2 lb

## 2018-04-11 DIAGNOSIS — D72829 Elevated white blood cell count, unspecified: Secondary | ICD-10-CM | POA: Diagnosis not present

## 2018-04-11 DIAGNOSIS — Z853 Personal history of malignant neoplasm of breast: Secondary | ICD-10-CM | POA: Diagnosis not present

## 2018-04-11 DIAGNOSIS — I10 Essential (primary) hypertension: Secondary | ICD-10-CM | POA: Diagnosis not present

## 2018-04-11 DIAGNOSIS — R05 Cough: Secondary | ICD-10-CM | POA: Diagnosis not present

## 2018-04-11 DIAGNOSIS — Z809 Family history of malignant neoplasm, unspecified: Secondary | ICD-10-CM

## 2018-04-11 DIAGNOSIS — C50411 Malignant neoplasm of upper-outer quadrant of right female breast: Secondary | ICD-10-CM

## 2018-04-11 DIAGNOSIS — Z803 Family history of malignant neoplasm of breast: Secondary | ICD-10-CM

## 2018-04-11 NOTE — Progress Notes (Signed)
Here for follow up. Stated overall feeling well but added she stated she has been having lower back pain, arms and legs hurt ( osteoarthritis )  On going she stated.  Stated she had CT scan of lungs she stated  To " rule out more cancer " pt stated.

## 2018-05-11 ENCOUNTER — Ambulatory Visit
Admission: RE | Admit: 2018-05-11 | Discharge: 2018-05-11 | Disposition: A | Discharge status: Home / Self Care | Payer: Medicare Other | Source: Ambulatory Visit | Attending: Nurse Practitioner | Admitting: Nurse Practitioner

## 2018-05-11 ENCOUNTER — Other Ambulatory Visit: Payer: Self-pay | Admitting: Nurse Practitioner

## 2018-05-11 DIAGNOSIS — I700 Atherosclerosis of aorta: Secondary | ICD-10-CM | POA: Insufficient documentation

## 2018-05-11 DIAGNOSIS — R1032 Left lower quadrant pain: Secondary | ICD-10-CM | POA: Insufficient documentation

## 2018-05-11 DIAGNOSIS — R1031 Right lower quadrant pain: Secondary | ICD-10-CM | POA: Insufficient documentation

## 2018-05-11 MED ORDER — IOPAMIDOL (ISOVUE-300) INJECTION 61%
100.0000 mL | Freq: Once | INTRAVENOUS | Status: AC | PRN
  Administered 2018-05-11: 100 mL via INTRAVENOUS

## 2018-05-12 ENCOUNTER — Other Ambulatory Visit
Admission: RE | Admit: 2018-05-12 | Discharge: 2018-05-12 | Disposition: A | Discharge status: Home / Self Care | Payer: Medicare Other | Source: Ambulatory Visit | Attending: Nurse Practitioner | Admitting: Nurse Practitioner

## 2018-05-12 DIAGNOSIS — R197 Diarrhea, unspecified: Secondary | ICD-10-CM | POA: Diagnosis present

## 2018-05-12 LAB — GASTROINTESTINAL PANEL BY PCR, STOOL (REPLACES STOOL CULTURE)

## 2018-05-12 LAB — C DIFFICILE QUICK SCREEN W PCR REFLEX
C Diff antigen: NEGATIVE
C Diff interpretation: NOT DETECTED
C Diff toxin: NEGATIVE

## 2018-05-20 ENCOUNTER — Ambulatory Visit
Admission: RE | Admit: 2018-05-20 | Discharge: 2018-05-20 | Disposition: A | Discharge status: Home / Self Care | Payer: Medicare Other | Source: Ambulatory Visit | Attending: Cardiology | Admitting: Cardiology

## 2018-05-20 ENCOUNTER — Other Ambulatory Visit: Payer: Self-pay | Admitting: Cardiology

## 2018-05-20 DIAGNOSIS — G319 Degenerative disease of nervous system, unspecified: Secondary | ICD-10-CM | POA: Insufficient documentation

## 2018-05-20 DIAGNOSIS — G459 Transient cerebral ischemic attack, unspecified: Secondary | ICD-10-CM

## 2018-05-20 DIAGNOSIS — I6789 Other cerebrovascular disease: Secondary | ICD-10-CM | POA: Insufficient documentation

## 2018-05-23 ENCOUNTER — Encounter: Payer: Self-pay | Admitting: Emergency Medicine

## 2018-05-23 ENCOUNTER — Emergency Department
Admission: EM | Admit: 2018-05-23 | Discharge: 2018-05-23 | Disposition: A | Discharge status: Home / Self Care | Payer: Medicare Other | Attending: Emergency Medicine | Admitting: Emergency Medicine

## 2018-05-23 ENCOUNTER — Other Ambulatory Visit
Admission: RE | Admit: 2018-05-23 | Discharge: 2018-05-23 | Disposition: A | Discharge status: Home / Self Care | Payer: Medicare Other | Source: Ambulatory Visit | Attending: Nurse Practitioner | Admitting: Nurse Practitioner

## 2018-05-23 DIAGNOSIS — Z7982 Long term (current) use of aspirin: Secondary | ICD-10-CM | POA: Insufficient documentation

## 2018-05-23 DIAGNOSIS — J45909 Unspecified asthma, uncomplicated: Secondary | ICD-10-CM | POA: Insufficient documentation

## 2018-05-23 DIAGNOSIS — R197 Diarrhea, unspecified: Secondary | ICD-10-CM | POA: Insufficient documentation

## 2018-05-23 DIAGNOSIS — I10 Essential (primary) hypertension: Secondary | ICD-10-CM | POA: Insufficient documentation

## 2018-05-23 DIAGNOSIS — R1030 Lower abdominal pain, unspecified: Secondary | ICD-10-CM | POA: Insufficient documentation

## 2018-05-23 DIAGNOSIS — Z79899 Other long term (current) drug therapy: Secondary | ICD-10-CM | POA: Diagnosis not present

## 2018-05-23 LAB — COMPREHENSIVE METABOLIC PANEL
ALBUMIN: 4 g/dL (ref 3.5–5.0)
ALK PHOS: 44 U/L (ref 38–126)
ALT: 22 U/L (ref 0–44)
AST: 23 U/L (ref 15–41)
Anion gap: 11 (ref 5–15)
BILIRUBIN TOTAL: 0.9 mg/dL (ref 0.3–1.2)
BUN: 13 mg/dL (ref 8–23)
CALCIUM: 9.2 mg/dL (ref 8.9–10.3)
CO2: 26 mmol/L (ref 22–32)
CREATININE: 0.89 mg/dL (ref 0.44–1.00)
Chloride: 92 mmol/L — ABNORMAL LOW (ref 98–111)
GFR calc Af Amer: >60 mL/min (ref >60)
GFR, EST NON AFRICAN AMERICAN: 58 mL/min — AB (ref >60)
GLUCOSE: 107 mg/dL — AB (ref 70–99)
POTASSIUM: 3.5 mmol/L (ref 3.5–5.1)
Sodium: 129 mmol/L — ABNORMAL LOW (ref 135–145)
TOTAL PROTEIN: 7.4 g/dL (ref 6.5–8.1)

## 2018-05-23 LAB — GASTROINTESTINAL PANEL BY PCR, STOOL (REPLACES STOOL CULTURE)

## 2018-05-23 LAB — TYPE AND SCREEN
ABO/RH(D): A POS
ANTIBODY SCREEN: NEGATIVE

## 2018-05-23 LAB — C DIFFICILE QUICK SCREEN W PCR REFLEX
C Diff antigen: NEGATIVE
C Diff interpretation: NOT DETECTED
C Diff toxin: NEGATIVE

## 2018-05-23 LAB — CBC
HCT: 39.6 % (ref 35.0–47.0)
Hemoglobin: 13.4 g/dL (ref 12.0–16.0)
MCH: 30.5 pg (ref 26.0–34.0)
MCHC: 33.8 g/dL (ref 32.0–36.0)
MCV: 90.3 fL (ref 80.0–100.0)
PLATELETS: 216 10*3/uL (ref 150–440)
RBC: 4.39 MIL/uL (ref 3.80–5.20)
RDW: 15.8 % — AB (ref 11.5–14.5)
WBC: 12.6 10*3/uL — AB (ref 3.6–11.0)

## 2018-05-23 LAB — LACTOFERRIN, FECAL, QUALITATIVE: Lactoferrin, Fecal, Qual: POSITIVE — AB

## 2018-05-23 LAB — LIPASE, BLOOD: Lipase: 24 U/L (ref 11–51)

## 2018-05-23 NOTE — ED Notes (Signed)
Pt to and from bathroom with steady gait. 1 episode of diarrhea per pt

## 2018-05-23 NOTE — ED Triage Notes (Signed)
Pt reports has had diarrhea for about 4 weeks. Pt states that she is also on eloquis and went to her MD today but they advised her to come to the ED because they thought she may have diverticulitis. Pt reports some bright blood in her diarrhea at times. Pt reports pain in her abd intermittently.

## 2018-05-23 NOTE — ED Notes (Signed)
Kinner MD at bedside 

## 2018-05-23 NOTE — ED Provider Notes (Signed)
[facility] Emergency Department Provider Note   ____________________________________________    I have reviewed the triage vital signs and the nursing notes.   HISTORY  Chief Complaint Diarrhea and GI Problem     HPI Janice Perkins is a 84 y.o. female who was sent for evaluation because of some blood in her stools.  Patient reports she was recently restarted on Eliquis against her judgment because she reports she has had GI bleeding in the past from Eliquis.  Has been on Eliquis for several days, followed up with GI today for recurrent diarrhea and was sent to the ED for evaluation because of some dark red blood mixed in with stool.  Patient denies lightheadedness.  Yesterday she had some significant abdominal pain however this is greatly improved today and she feels much better.  She does not want to continue Eliquis anymore.  No fevers or chills.  Had recent GI panel which was unremarkable, had recent C. difficile panel which was negative   Past Medical History:  Diagnosis Date  . Asthma   . Atrial fibrillation ([place])   . Breast cancer ([place]) 2011  . Bronchitis 04/2015  . Cancer [facility]) 2011   breast  . COPD (chronic obstructive pulmonary disease) ([place])   . Hypertension   . Shingles    10/2015    Patient Active Problem List   Diagnosis Date Noted  . Leukocytosis 04/05/2018  . Pneumonia 11/15/2017  . Pleural effusion 05/16/2017  . AF (paroxysmal atrial fibrillation) ([place]) 05/16/2017  . Breast cancer ([place]) 05/16/2017  . Dependence on nocturnal oxygen therapy 05/16/2017  . HTN (hypertension) 05/16/2017  . Generalized weakness 03/07/2017  . Hyponatremia 03/07/2017  . Dehydration 03/07/2017  . UTI (urinary tract infection) 03/07/2017  . HCAP (healthcare-associated pneumonia) 01/19/2017  . AKI (acute kidney injury) ([place]) 12/18/2016  . Primary cancer of upper outer quadrant of right female breast ([place]) 04/07/2016  . Lumbar radiculopathy 03/23/2016   . Spinal stenosis, lumbar region, with neurogenic claudication 03/23/2016  . DDD (degenerative disc disease), lumbar 01/14/2015  . Lumbosacral facet joint syndrome 01/14/2015  . Sacroiliac joint dysfunction 01/14/2015  . Greater trochanteric bursitis 01/14/2015  . Bilateral occipital neuralgia 01/14/2015    Past Surgical History:  Procedure Laterality Date  . BREAST EXCISIONAL BIOPSY Right 11/29/13   two areas FAT NECROSIS  . BREAST EXCISIONAL BIOPSY Right 10/30/2009   lumpectomy - radiation    Prior to Admission medications   Medication Sig Start Date End Date Taking? Authorizing Provider  acetaminophen (TYLENOL) 325 MG tablet Take 2 tablets (650 mg total) by mouth every 6 (six) hours as needed for mild pain (or Fever >/= 101). Patient not taking: Reported on 04/11/2018 12/22/16   Nicholes Mango, MD  albuterol (PROVENTIL HFA;VENTOLIN HFA) 108 (90 Base) MCG/ACT inhaler Inhale 1-2 puffs into the lungs every 6 (six) hours as needed for wheezing or shortness of breath.    [provider]  ALPRAZolam Duanne Moron) 0.25 MG tablet Take 1 tablet by mouth every morning.    [provider]  aspirin 81 MG tablet Take 81 mg by mouth daily.     [provider]  atorvastatin (LIPITOR) 20 MG tablet Take 20 mg by mouth daily.    [provider]  cholecalciferol (VITAMIN D) 1000 units tablet Take 1,000 Units by mouth daily.    [provider]  cloNIDine (CATAPRES) 0.1 MG tablet Take 1 tablet (0.1 mg total) by mouth 2 (two) times daily as needed (Take  as needed for systolic blood pressure (top number) over 170.  Hold if diastolic (bottom number) is below 70.). Patient not taking: Reported on 03/24/2018 11/24/17 11/24/18  Schaevitz, Randall An, MD  diltiazem (DILACOR XR) 120 MG 24 hr capsule Take 120 mg by mouth daily.    [provider]  docusate sodium (COLACE) 100 MG capsule Take 1 capsule (100 mg total) by mouth daily. Patient not taking: Reported on 04/11/2018  12/22/16   Nicholes Mango, MD  esomeprazole (NEXIUM) 40 MG capsule Take 40 mg by mouth 2 (two) times daily before a meal.     [provider]  ferrous sulfate 325 (65 FE) MG tablet Take 325 mg by mouth daily with breakfast.    [provider]  fluticasone (FLONASE) 50 MCG/ACT nasal spray Place 2 sprays into both nostrils daily.     [provider]  Fluticasone-Salmeterol (ADVAIR) 250-50 MCG/DOSE AEPB Inhale 1 puff into the lungs 2 (two) times daily.    [provider]  guaiFENesin (MUCINEX) 600 MG 12 hr tablet Take 1 tablet (600 mg total) by mouth 2 (two) times daily. Patient not taking: Reported on 03/24/2018 11/21/17   Saundra Shelling, MD  hydrALAZINE (APRESOLINE) 25 MG tablet Take 1 tablet (25 mg total) by mouth every 6 (six) hours. 12/22/16   Nicholes Mango, MD  HYDROcodone-acetaminophen (NORCO/VICODIN) 5-325 MG tablet Take 1 tablet by mouth as needed.    [provider]  levothyroxine (SYNTHROID, LEVOTHROID) 75 MCG tablet Take 1 tablet (75 mcg total) by mouth daily before breakfast. 12/23/16   Gouru, Illene Silver, MD  magnesium oxide (MAG-OX) 400 MG tablet Take 400 mg by mouth 2 (two) times daily.    [provider]  montelukast (SINGULAIR) 10 MG tablet Take 10 mg by mouth daily.    [provider]  ondansetron (ZOFRAN) 4 MG tablet Take 1 tablet by mouth daily.    [provider]  senna (SENOKOT) 8.6 MG TABS tablet Take 1 tablet by mouth daily as needed for mild constipation.    [provider]  spironolactone (ALDACTONE) 25 MG tablet Take 25 mg by mouth daily.    [provider]     Allergies Diphenhydramine hcl; Penicillins; Captopril; Enalapril maleate; Tape; Terfenadine; Augmentin [amoxicillin-pot clavulanate]; and Biaxin [clarithromycin]  Family History  Problem Relation Age of Onset  . Hypertension Mother   . Heart disease Mother   . Cancer Mother   . Stroke Father   . Hypertension Father   . Breast cancer  Sister     Social History Social History   Tobacco Use  . Smoking status: Never Smoker  . Smokeless tobacco: Never Used  Substance Use Topics  . Alcohol use: No    Alcohol/week: 0.0 standard drinks  . Drug use: No    Review of Systems  Constitutional: No fever/chills Eyes: No visual changes.  ENT: No sore throat. Cardiovascular: Denies chest pain. Respiratory: Denies shortness of breath. Gastrointestinal: As above Genitourinary: Negative for dysuria. Musculoskeletal: Negative for back pain. Skin: Negative for rash. Neurological: Negative for headaches or weakness   ____________________________________________   PHYSICAL EXAM:  VITAL SIGNS: ED Triage Vitals  Enc Vitals Group     BP 05/23/18 1223 123/69     Pulse Rate 05/23/18 1223 74     Resp 05/23/18 1223 18     Temp 05/23/18 1223 99.3 F (37.4 C)     Temp Source 05/23/18 1223 Oral     SpO2 05/23/18 1223 99 %  Weight 05/23/18 1223 71.7 kg (158 lb)     Height 05/23/18 1223 1.524 m (5')     Head Circumference --      Peak Flow --      Pain Score 05/23/18 1240 3     Pain Loc --      Pain Edu? --      Excl. in [place]? --     Constitutional: Alert and oriented Eyes: Conjunctivae are normal.   Nose: No congestion/rhinnorhea. Mouth/Throat: Mucous membranes are moist.    Cardiovascular: Normal rate, regular rhythm. Grossly normal heart sounds.  Good peripheral circulation. Respiratory: Normal respiratory effort.  No retractions. Lungs CTAB. Gastrointestinal: Soft and nontender. No distention.  Reassuring exam  Musculoskeletal: No lower extremity tenderness nor edema.  Warm and well perfused Neurologic:  Normal speech and language. No gross focal neurologic deficits are appreciated.  Skin:  Skin is warm, dry and intact. No rash noted. Psychiatric: Mood and affect are normal. Speech and behavior are normal.  ____________________________________________   LABS (all labs ordered are listed, but only abnormal  results are displayed)  Labs Reviewed  COMPREHENSIVE METABOLIC PANEL - Abnormal; Notable for the following components:      Result Value   Sodium 129 (*)    Chloride 92 (*)    Glucose, Bld 107 (*)    GFR calc non Af Amer 58 (*)    All other components within normal limits  CBC - Abnormal; Notable for the following components:   WBC 12.6 (*)    RDW 15.8 (*)    All other components within normal limits  LIPASE, BLOOD  URINALYSIS, COMPLETE (UACMP) WITH MICROSCOPIC  POC OCCULT BLOOD, ED  TYPE AND SCREEN   ____________________________________________  EKG  None ____________________________________________  RADIOLOGY   ____________________________________________   PROCEDURES  Procedure(s) performed: No  Procedures   Critical Care performed: No ____________________________________________   INITIAL IMPRESSION / ASSESSMENT AND PLAN / ED COURSE  Pertinent labs & imaging results that were available during my care of the patient were reviewed by me and considered in my medical decision making (see chart for details).  Patient well-appearing in no acute distress.  Lab work is overall quite reassuring, hemoglobin is completely stable.  Minimal elevation white blood cell count is nonspecific.  Abdominal pain that she had yesterday has greatly improved.  No fevers or chills.  No abdominal tenderness palpation.  Discussed with patient at length, she states that she will no longer take Eliquis which is reasonable given her experience.  I offered admission but she does not feel this is necessary given normal hemoglobin and I tend to agree.  I will discharge her with close outpatient follow-up, strict return precautions discussed including worsening bleeding abdominal pain nausea dizziness etc.    ____________________________________________   FINAL CLINICAL IMPRESSION(S) / ED DIAGNOSES  Final diagnoses:  Diarrhea in adult patient  Lower abdominal pain        Note:   This document was prepared using Dragon voice recognition software and may include unintentional dictation errors.    Lavonia Drafts, MD 05/23/18 [phone]

## 2018-05-24 LAB — CALPROTECTIN, FECAL: Calprotectin, Fecal: 224 ug/g — ABNORMAL HIGH (ref 0–120)

## 2018-05-30 ENCOUNTER — Observation Stay
Admission: EM | Admit: 2018-05-30 | Discharge: 2018-05-31 | Disposition: A | Discharge status: Home / Self Care | Payer: Medicare Other | Attending: Internal Medicine | Admitting: Internal Medicine

## 2018-05-30 ENCOUNTER — Emergency Department: Payer: Medicare Other

## 2018-05-30 ENCOUNTER — Other Ambulatory Visit: Payer: Self-pay

## 2018-05-30 ENCOUNTER — Inpatient Hospital Stay: Payer: Medicare Other

## 2018-05-30 DIAGNOSIS — I700 Atherosclerosis of aorta: Secondary | ICD-10-CM | POA: Insufficient documentation

## 2018-05-30 DIAGNOSIS — E039 Hypothyroidism, unspecified: Secondary | ICD-10-CM | POA: Diagnosis not present

## 2018-05-30 DIAGNOSIS — E785 Hyperlipidemia, unspecified: Secondary | ICD-10-CM | POA: Insufficient documentation

## 2018-05-30 DIAGNOSIS — Z803 Family history of malignant neoplasm of breast: Secondary | ICD-10-CM | POA: Insufficient documentation

## 2018-05-30 DIAGNOSIS — J449 Chronic obstructive pulmonary disease, unspecified: Secondary | ICD-10-CM | POA: Insufficient documentation

## 2018-05-30 DIAGNOSIS — Z7951 Long term (current) use of inhaled steroids: Secondary | ICD-10-CM | POA: Insufficient documentation

## 2018-05-30 DIAGNOSIS — Z91048 Other nonmedicinal substance allergy status: Secondary | ICD-10-CM | POA: Insufficient documentation

## 2018-05-30 DIAGNOSIS — N179 Acute kidney failure, unspecified: Secondary | ICD-10-CM | POA: Insufficient documentation

## 2018-05-30 DIAGNOSIS — M5481 Occipital neuralgia: Secondary | ICD-10-CM | POA: Insufficient documentation

## 2018-05-30 DIAGNOSIS — Z7901 Long term (current) use of anticoagulants: Secondary | ICD-10-CM | POA: Diagnosis not present

## 2018-05-30 DIAGNOSIS — Z853 Personal history of malignant neoplasm of breast: Secondary | ICD-10-CM | POA: Insufficient documentation

## 2018-05-30 DIAGNOSIS — C50411 Malignant neoplasm of upper-outer quadrant of right female breast: Secondary | ICD-10-CM | POA: Insufficient documentation

## 2018-05-30 DIAGNOSIS — R1110 Vomiting, unspecified: Secondary | ICD-10-CM | POA: Diagnosis not present

## 2018-05-30 DIAGNOSIS — I480 Paroxysmal atrial fibrillation: Secondary | ICD-10-CM | POA: Insufficient documentation

## 2018-05-30 DIAGNOSIS — I252 Old myocardial infarction: Secondary | ICD-10-CM | POA: Insufficient documentation

## 2018-05-30 DIAGNOSIS — R0902 Hypoxemia: Secondary | ICD-10-CM | POA: Diagnosis present

## 2018-05-30 DIAGNOSIS — N390 Urinary tract infection, site not specified: Secondary | ICD-10-CM | POA: Insufficient documentation

## 2018-05-30 DIAGNOSIS — Z7982 Long term (current) use of aspirin: Secondary | ICD-10-CM | POA: Insufficient documentation

## 2018-05-30 DIAGNOSIS — J9601 Acute respiratory failure with hypoxia: Secondary | ICD-10-CM | POA: Insufficient documentation

## 2018-05-30 DIAGNOSIS — Z809 Family history of malignant neoplasm, unspecified: Secondary | ICD-10-CM | POA: Insufficient documentation

## 2018-05-30 DIAGNOSIS — R0789 Other chest pain: Secondary | ICD-10-CM | POA: Diagnosis not present

## 2018-05-30 DIAGNOSIS — R197 Diarrhea, unspecified: Secondary | ICD-10-CM | POA: Diagnosis not present

## 2018-05-30 DIAGNOSIS — Z79899 Other long term (current) drug therapy: Secondary | ICD-10-CM | POA: Diagnosis not present

## 2018-05-30 DIAGNOSIS — R05 Cough: Secondary | ICD-10-CM | POA: Diagnosis not present

## 2018-05-30 DIAGNOSIS — I2510 Atherosclerotic heart disease of native coronary artery without angina pectoris: Secondary | ICD-10-CM | POA: Diagnosis not present

## 2018-05-30 DIAGNOSIS — Z880 Allergy status to penicillin: Secondary | ICD-10-CM | POA: Insufficient documentation

## 2018-05-30 DIAGNOSIS — Z8669 Personal history of other diseases of the nervous system and sense organs: Secondary | ICD-10-CM | POA: Insufficient documentation

## 2018-05-30 DIAGNOSIS — I10 Essential (primary) hypertension: Secondary | ICD-10-CM | POA: Insufficient documentation

## 2018-05-30 DIAGNOSIS — A419 Sepsis, unspecified organism: Secondary | ICD-10-CM | POA: Insufficient documentation

## 2018-05-30 DIAGNOSIS — K760 Fatty (change of) liver, not elsewhere classified: Secondary | ICD-10-CM | POA: Diagnosis not present

## 2018-05-30 DIAGNOSIS — Z9981 Dependence on supplemental oxygen: Secondary | ICD-10-CM | POA: Insufficient documentation

## 2018-05-30 DIAGNOSIS — Z96612 Presence of left artificial shoulder joint: Secondary | ICD-10-CM | POA: Insufficient documentation

## 2018-05-30 DIAGNOSIS — M48062 Spinal stenosis, lumbar region with neurogenic claudication: Secondary | ICD-10-CM | POA: Insufficient documentation

## 2018-05-30 DIAGNOSIS — Z823 Family history of stroke: Secondary | ICD-10-CM | POA: Insufficient documentation

## 2018-05-30 DIAGNOSIS — J189 Pneumonia, unspecified organism: Secondary | ICD-10-CM | POA: Diagnosis not present

## 2018-05-30 DIAGNOSIS — Z881 Allergy status to other antibiotic agents status: Secondary | ICD-10-CM | POA: Insufficient documentation

## 2018-05-30 DIAGNOSIS — Z8249 Family history of ischemic heart disease and other diseases of the circulatory system: Secondary | ICD-10-CM | POA: Insufficient documentation

## 2018-05-30 DIAGNOSIS — Z888 Allergy status to other drugs, medicaments and biological substances status: Secondary | ICD-10-CM | POA: Insufficient documentation

## 2018-05-30 LAB — URINALYSIS, ROUTINE W REFLEX MICROSCOPIC
Bilirubin Urine: NEGATIVE
GLUCOSE, UA: NEGATIVE mg/dL
Hgb urine dipstick: NEGATIVE
Ketones, ur: NEGATIVE mg/dL
Nitrite: NEGATIVE
PROTEIN: NEGATIVE mg/dL
SPECIFIC GRAVITY, URINE: 1.014 (ref 1.005–1.030)
pH: 7 (ref 5.0–8.0)

## 2018-05-30 LAB — COMPREHENSIVE METABOLIC PANEL
ALBUMIN: 3.8 g/dL (ref 3.5–5.0)
ALT: 16 U/L (ref 0–44)
AST: 30 U/L (ref 15–41)
Alkaline Phosphatase: 49 U/L (ref 38–126)
Anion gap: 18 — ABNORMAL HIGH (ref 5–15)
BILIRUBIN TOTAL: 0.9 mg/dL (ref 0.3–1.2)
BUN: 17 mg/dL (ref 8–23)
CALCIUM: 9.2 mg/dL (ref 8.9–10.3)
CO2: 22 mmol/L (ref 22–32)
Chloride: 94 mmol/L — ABNORMAL LOW (ref 98–111)
Creatinine, Ser: 0.99 mg/dL (ref 0.44–1.00)
GFR calc Af Amer: 59 mL/min — ABNORMAL LOW (ref >60)
GFR calc non Af Amer: 51 mL/min — ABNORMAL LOW (ref >60)
GLUCOSE: 126 mg/dL — AB (ref 70–99)
Potassium: 3.9 mmol/L (ref 3.5–5.1)
Sodium: 134 mmol/L — ABNORMAL LOW (ref 135–145)
TOTAL PROTEIN: 7.6 g/dL (ref 6.5–8.1)

## 2018-05-30 LAB — CBC WITH DIFFERENTIAL/PLATELET
Abs Immature Granulocytes: 0.16 10*3/uL — ABNORMAL HIGH (ref 0.00–0.07)
BASOS ABS: 0.1 10*3/uL (ref 0.0–0.1)
Basophils Relative: 0 %
EOS PCT: 1 %
Eosinophils Absolute: 0.3 10*3/uL (ref 0.0–0.5)
HEMATOCRIT: 38.2 % (ref 36.0–46.0)
Hemoglobin: 12.9 g/dL (ref 12.0–15.0)
IMMATURE GRANULOCYTES: 1 %
LYMPHS PCT: 6 %
Lymphs Abs: 1.4 10*3/uL (ref 0.7–4.0)
MCH: 29.7 pg (ref 26.0–34.0)
MCHC: 33.8 g/dL (ref 30.0–36.0)
MCV: 88 fL (ref 80.0–100.0)
MONOS PCT: 7 %
Monocytes Absolute: 1.6 10*3/uL — ABNORMAL HIGH (ref 0.1–1.0)
NRBC: 0 % (ref 0.0–0.2)
Neutro Abs: 18.7 10*3/uL — ABNORMAL HIGH (ref 1.7–7.7)
Neutrophils Relative %: 85 %
Platelets: 266 10*3/uL (ref 150–400)
RBC: 4.34 MIL/uL (ref 3.87–5.11)
RDW: 14.7 % (ref 11.5–15.5)
WBC: 22.2 10*3/uL — ABNORMAL HIGH (ref 4.0–10.5)

## 2018-05-30 LAB — LIPASE, BLOOD: Lipase: 20 U/L (ref 11–51)

## 2018-05-30 LAB — LACTIC ACID, PLASMA
Lactic Acid, Venous: 2.4 mmol/L (ref 0.5–1.9)
Lactic Acid, Venous: 1.5 mmol/L (ref 0.5–1.9)

## 2018-05-30 LAB — TROPONIN I: Troponin I: 0.06 ng/mL (ref <0.03)

## 2018-05-30 MED ORDER — ONDANSETRON HCL 4 MG PO TABS
4.0000 mg | ORAL_TABLET | Freq: Four times a day (QID) | ORAL | Status: DC | PRN
  Administered 2018-05-31: 09:00:00 4 mg via ORAL

## 2018-05-30 MED ORDER — ALBUTEROL SULFATE HFA 108 (90 BASE) MCG/ACT IN AERS: 1.0000 | INHALATION_SPRAY | Freq: Four times a day (QID) | RESPIRATORY_TRACT | Status: DC | PRN

## 2018-05-30 MED ORDER — VITAMIN D 1000 UNITS PO TABS
1000.0000 [IU] | ORAL_TABLET | Freq: Every day | ORAL | Status: DC
  Administered 2018-05-30 – 2018-05-31 (×2): 1000 [IU] via ORAL
  Filled 2018-05-30 (×2): qty 1

## 2018-05-30 MED ORDER — FLUTICASONE PROPIONATE 50 MCG/ACT NA SUSP
2.0000 | Freq: Every day | NASAL | Status: DC
  Administered 2018-05-30 – 2018-05-31 (×2): 2 via NASAL
  Filled 2018-05-30 (×2): qty 16

## 2018-05-30 MED ORDER — MOMETASONE FURO-FORMOTEROL FUM 200-5 MCG/ACT IN AERO
2.0000 | INHALATION_SPRAY | Freq: Two times a day (BID) | RESPIRATORY_TRACT | Status: DC
  Administered 2018-05-30 – 2018-05-31 (×3): 2 via RESPIRATORY_TRACT
  Filled 2018-05-30: qty 8.8

## 2018-05-30 MED ORDER — DILTIAZEM HCL ER 120 MG PO CP24
120.0000 mg | ORAL_CAPSULE | Freq: Every day | ORAL | Status: DC
  Administered 2018-05-31: 120 mg via ORAL
  Filled 2018-05-30: qty 1

## 2018-05-30 MED ORDER — LEVOTHYROXINE SODIUM 75 MCG PO TABS
75.0000 ug | ORAL_TABLET | Freq: Every day | ORAL | Status: DC
  Administered 2018-05-30 – 2018-05-31 (×2): 75 ug via ORAL
  Filled 2018-05-30: qty 1
  Filled 2018-05-30: qty 3
  Filled 2018-05-30: qty 1
  Filled 2018-05-30: qty 3

## 2018-05-30 MED ORDER — ATORVASTATIN CALCIUM 20 MG PO TABS
20.0000 mg | ORAL_TABLET | Freq: Every day | ORAL | Status: DC
  Administered 2018-05-30: 20 mg via ORAL
  Filled 2018-05-30: qty 1

## 2018-05-30 MED ORDER — SENNA 8.6 MG PO TABS: 1.0000 | ORAL_TABLET | Freq: Every day | ORAL | Status: DC | PRN

## 2018-05-30 MED ORDER — SODIUM CHLORIDE 0.9% FLUSH
3.0000 mL | Freq: Two times a day (BID) | INTRAVENOUS | Status: DC
  Administered 2018-05-30 – 2018-05-31 (×3): 3 mL via INTRAVENOUS

## 2018-05-30 MED ORDER — APIXABAN 5 MG PO TABS
5.0000 mg | ORAL_TABLET | Freq: Two times a day (BID) | ORAL | Status: DC
  Administered 2018-05-30: 5 mg via ORAL
  Filled 2018-05-30 (×2): qty 1

## 2018-05-30 MED ORDER — ALPRAZOLAM 0.25 MG PO TABS
0.2500 mg | ORAL_TABLET | Freq: Every day | ORAL | Status: DC
  Administered 2018-05-30 – 2018-05-31 (×2): 0.25 mg via ORAL
  Filled 2018-05-30 (×2): qty 1

## 2018-05-30 MED ORDER — MAGNESIUM OXIDE 400 (241.3 MG) MG PO TABS
400.0000 mg | ORAL_TABLET | Freq: Two times a day (BID) | ORAL | Status: DC
  Administered 2018-05-30 – 2018-05-31 (×3): 400 mg via ORAL
  Filled 2018-05-30 (×5): qty 1

## 2018-05-30 MED ORDER — LOPERAMIDE HCL 2 MG PO CAPS
2.0000 mg | ORAL_CAPSULE | Freq: Once | ORAL | Status: AC
  Administered 2018-05-30: 2 mg via ORAL
  Filled 2018-05-30: qty 1

## 2018-05-30 MED ORDER — HYDRALAZINE HCL 50 MG PO TABS
25.0000 mg | ORAL_TABLET | Freq: Four times a day (QID) | ORAL | Status: DC
  Administered 2018-05-31 (×2): 25 mg via ORAL
  Filled 2018-05-30 (×3): qty 1

## 2018-05-30 MED ORDER — PANTOPRAZOLE SODIUM 40 MG PO TBEC
40.0000 mg | DELAYED_RELEASE_TABLET | Freq: Every day | ORAL | Status: DC
  Administered 2018-05-30 – 2018-05-31 (×2): 40 mg via ORAL
  Filled 2018-05-30 (×2): qty 1

## 2018-05-30 MED ORDER — SODIUM CHLORIDE 0.9% FLUSH: 3.0000 mL | INTRAVENOUS | Status: DC | PRN

## 2018-05-30 MED ORDER — LEVOFLOXACIN IN D5W 750 MG/150ML IV SOLN
750.0000 mg | INTRAVENOUS | Status: DC
  Administered 2018-05-30: 750 mg via INTRAVENOUS

## 2018-05-30 MED ORDER — GUAIFENESIN 100 MG/5ML PO SOLN
5.0000 mL | ORAL | Status: DC | PRN
  Filled 2018-05-30: qty 5

## 2018-05-30 MED ORDER — ONDANSETRON HCL 4 MG PO TABS
4.0000 mg | ORAL_TABLET | Freq: Once | ORAL | Status: AC
  Administered 2018-05-30: 4 mg via ORAL
  Filled 2018-05-30: qty 1

## 2018-05-30 MED ORDER — ACETAMINOPHEN 650 MG RE SUPP: 650.0000 mg | Freq: Four times a day (QID) | RECTAL | Status: DC | PRN

## 2018-05-30 MED ORDER — SODIUM CHLORIDE 0.9 % IV SOLN
2.0000 g | Freq: Once | INTRAVENOUS | Status: AC
  Administered 2018-05-30: 2 g via INTRAVENOUS
  Filled 2018-05-30: qty 2

## 2018-05-30 MED ORDER — ASPIRIN EC 81 MG PO TBEC: 81.0000 mg | DELAYED_RELEASE_TABLET | Freq: Every day | ORAL | Status: DC

## 2018-05-30 MED ORDER — MONTELUKAST SODIUM 10 MG PO TABS
10.0000 mg | ORAL_TABLET | Freq: Every day | ORAL | Status: DC
  Administered 2018-05-30: 21:00:00 10 mg via ORAL
  Filled 2018-05-30: qty 1

## 2018-05-30 MED ORDER — ALBUTEROL SULFATE (2.5 MG/3ML) 0.083% IN NEBU: 2.5000 mg | INHALATION_SOLUTION | Freq: Four times a day (QID) | RESPIRATORY_TRACT | Status: DC | PRN

## 2018-05-30 MED ORDER — HYDROCODONE-ACETAMINOPHEN 5-325 MG PO TABS: 1.0000 | ORAL_TABLET | ORAL | Status: DC | PRN

## 2018-05-30 MED ORDER — ONDANSETRON HCL 4 MG PO TABS
4.0000 mg | ORAL_TABLET | Freq: Every day | ORAL | Status: DC | PRN
  Filled 2018-05-30: qty 1

## 2018-05-30 MED ORDER — ACETAMINOPHEN 325 MG PO TABS: 650.0000 mg | ORAL_TABLET | Freq: Four times a day (QID) | ORAL | Status: DC | PRN

## 2018-05-30 MED ORDER — SPIRONOLACTONE 25 MG PO TABS
25.0000 mg | ORAL_TABLET | Freq: Every day | ORAL | Status: DC
  Administered 2018-05-30 – 2018-05-31 (×2): 25 mg via ORAL
  Filled 2018-05-30 (×2): qty 1

## 2018-05-30 MED ORDER — IOHEXOL 350 MG/ML SOLN
75.0000 mL | Freq: Once | INTRAVENOUS | Status: AC | PRN
  Administered 2018-05-30: 75 mL via INTRAVENOUS

## 2018-05-30 MED ORDER — ONDANSETRON HCL 4 MG/2ML IJ SOLN: 4.0000 mg | Freq: Four times a day (QID) | INTRAMUSCULAR | Status: DC | PRN

## 2018-05-30 MED ORDER — POLYETHYLENE GLYCOL 3350 17 G PO PACK: 17.0000 g | PACK | Freq: Every day | ORAL | Status: DC | PRN

## 2018-05-30 MED ORDER — SODIUM CHLORIDE 0.9 % IV SOLN: 250.0000 mL | INTRAVENOUS | Status: DC | PRN

## 2018-05-30 MED ORDER — LEVOTHYROXINE SODIUM 50 MCG PO TABS: 75.0000 ug | ORAL_TABLET | Freq: Every day | ORAL | Status: DC

## 2018-05-30 MED ORDER — FERROUS SULFATE 325 (65 FE) MG PO TABS
325.0000 mg | ORAL_TABLET | Freq: Every day | ORAL | Status: DC
  Administered 2018-05-31: 325 mg via ORAL
  Filled 2018-05-30: qty 1

## 2018-05-30 MED ORDER — LEVOFLOXACIN IN D5W 750 MG/150ML IV SOLN
INTRAVENOUS | Status: AC
  Administered 2018-05-30: 750 mg via INTRAVENOUS
  Filled 2018-05-30: qty 150

## 2018-05-30 MED ORDER — HYDROCODONE-ACETAMINOPHEN 5-325 MG PO TABS
1.0000 | ORAL_TABLET | Freq: Two times a day (BID) | ORAL | Status: DC
  Administered 2018-05-30 – 2018-05-31 (×3): 1 via ORAL
  Filled 2018-05-30 (×3): qty 1

## 2018-05-30 NOTE — ED Notes (Addendum)
1st attempt to call report. Placed on hold for 5 minutes. Will attempt again

## 2018-05-30 NOTE — ED Notes (Signed)
Date and time results received: 05/30/18   Test: troponin Critical Value: 0.06  Name of Provider Notified: Dr Clearnce Hasten

## 2018-05-30 NOTE — Progress Notes (Signed)
Family Meeting Note  Advance Directive:no  Today a meeting took place with the Patient.  The following clinical team members were present during this meeting:MD RN  The following were discussed:Patient's diagnosis acute hypoxic respiratory failure with sepsis and pneumonia: , Patient's progosis: > 12 months and Goals for treatment: Full Code  Additional follow-up to be provided: Chaplain consult to start advanced directives  Time spent during discussion:16 minutes  Janice Elliston, MD

## 2018-05-30 NOTE — ED Notes (Signed)
ED Provider at bedside. 

## 2018-05-30 NOTE — ED Provider Notes (Addendum)
[facility] Emergency Department Provider Note  ___________________________________________   First MD Initiated Contact with Patient 05/30/18 [phone]     (approximate)  I have reviewed the triage vital signs and the nursing notes.   HISTORY  Chief Complaint Emesis  HPI Janice Perkins is a 84 y.o. female with atrial fibrillation on aspirin as well as COPD and asthma who is presenting to the emergency department today with left-sided chest pain as well as a productive cough.  She says that she coughed up blood this morning.  Says that it was just several drops and she has not coughed up blood since.  Says that she has had diarrhea over the past month and is Artie gone 3 times today.  Denies any blood in her diarrhea.  Says that she also vomited yesterday and did not see any blood in her vomitus.  Denies any abdominal pain at this time.  Says that she also has left-sided chest and back pain with deep breathing which is sharp and a 10 out of 10.  Says that this feels similar to the previous time that she has had pneumonia.  Says that she was called in Levaquin yesterday by her primary care doctor but was unable to pick it up because of the pharmacy being closed.  She reported the emergency department this morning because of the coughing of blood this morning.  Also says that she is runny nose but denies any nosebleeds.  Has had a history of nosebleeds in the past.  Does not report any shortness of breath or chest pain with exertion.  Patient says that she has had 2 stool studies over the past month that are both negative.  She says that she is dissipating having a colonoscopy in the next several weeks.  Past Medical History:  Diagnosis Date  . Asthma   . Atrial fibrillation ([place])   . Breast cancer ([place]) 2011  . Bronchitis 04/2015  . Cancer [facility]) 2011   breast  . COPD (chronic obstructive pulmonary disease) ([place])   . Hypertension   . Shingles    10/2015    Patient  Active Problem List   Diagnosis Date Noted  . Leukocytosis 04/05/2018  . Pneumonia 11/15/2017  . Pleural effusion 05/16/2017  . AF (paroxysmal atrial fibrillation) ([place]) 05/16/2017  . Breast cancer ([place]) 05/16/2017  . Dependence on nocturnal oxygen therapy 05/16/2017  . HTN (hypertension) 05/16/2017  . Generalized weakness 03/07/2017  . Hyponatremia 03/07/2017  . Dehydration 03/07/2017  . UTI (urinary tract infection) 03/07/2017  . HCAP (healthcare-associated pneumonia) 01/19/2017  . AKI (acute kidney injury) ([place]) 12/18/2016  . Primary cancer of upper outer quadrant of right female breast ([place]) 04/07/2016  . Lumbar radiculopathy 03/23/2016  . Spinal stenosis, lumbar region, with neurogenic claudication 03/23/2016  . DDD (degenerative disc disease), lumbar 01/14/2015  . Lumbosacral facet joint syndrome 01/14/2015  . Sacroiliac joint dysfunction 01/14/2015  . Greater trochanteric bursitis 01/14/2015  . Bilateral occipital neuralgia 01/14/2015    Past Surgical History:  Procedure Laterality Date  . BREAST EXCISIONAL BIOPSY Right 11/29/13   two areas FAT NECROSIS  . BREAST EXCISIONAL BIOPSY Right 10/30/2009   lumpectomy - radiation    Prior to Admission medications   Medication Sig Start Date End Date Taking? Authorizing Provider  albuterol (ACCUNEB) 1.25 MG/3ML nebulizer solution Inhale 3 mLs into the lungs every 6 (six) hours as needed for wheezing.    Yes [provider]  albuterol (PROVENTIL HFA;VENTOLIN HFA) 108 (90  Base) MCG/ACT inhaler Inhale 1-2 puffs into the lungs every 6 (six) hours as needed for wheezing or shortness of breath.   Yes [provider]  ALPRAZolam (XANAX) 0.25 MG tablet Take 0.25 mg by mouth daily.    Yes [provider]  aspirin EC 81 MG tablet Take 81 mg by mouth daily.   Yes [provider]  atorvastatin (LIPITOR) 20 MG tablet Take 20 mg by mouth at bedtime.    Yes [provider]  cholecalciferol (VITAMIN D)  1000 units tablet Take 1,000 Units by mouth daily.   Yes [provider]  diltiazem (DILACOR XR) 120 MG 24 hr capsule Take 120 mg by mouth daily.   Yes [provider]  esomeprazole (NEXIUM) 40 MG capsule Take 40 mg by mouth 2 (two) times daily before a meal.    Yes [provider]  ferrous sulfate 325 (65 FE) MG tablet Take 325 mg by mouth daily with breakfast.   Yes [provider]  fluticasone (FLONASE) 50 MCG/ACT nasal spray Place 2 sprays into both nostrils daily.    Yes [provider]  Fluticasone-Salmeterol (ADVAIR) 250-50 MCG/DOSE AEPB Inhale 1 puff into the lungs 2 (two) times daily.   Yes [provider]  hydrALAZINE (APRESOLINE) 25 MG tablet Take 1 tablet (25 mg total) by mouth every 6 (six) hours. 12/22/16  Yes Gouru, Illene Silver, MD  HYDROcodone-acetaminophen (NORCO/VICODIN) 5-325 MG tablet Take 1 tablet by mouth 2 (two) times daily.    Yes [provider]  levothyroxine (SYNTHROID, LEVOTHROID) 75 MCG tablet Take 1 tablet (75 mcg total) by mouth daily before breakfast. 12/23/16  Yes Gouru, Aruna, MD  magnesium oxide (MAG-OX) 400 MG tablet Take 400 mg by mouth 2 (two) times daily.   Yes [provider]  montelukast (SINGULAIR) 10 MG tablet Take 10 mg by mouth at bedtime.    Yes [provider]  ondansetron (ZOFRAN) 4 MG tablet Take 1 tablet by mouth daily as needed for nausea.    Yes [provider]  senna (SENOKOT) 8.6 MG TABS tablet Take 1 tablet by mouth daily as needed for mild constipation.   Yes [provider]  spironolactone (ALDACTONE) 25 MG tablet Take 25 mg by mouth daily.   Yes [provider]  apixaban (ELIQUIS) 5 MG TABS tablet Take 5 mg by mouth 2 (two) times daily. 05/20/18   [provider]  cloNIDine (CATAPRES) 0.1 MG tablet Take 1 tablet (0.1 mg total) by mouth 2 (two) times daily as needed (Take as needed for systolic blood pressure (top number) over 170.  Hold if  diastolic (bottom number) is below 70.). Patient not taking: Reported on 03/24/2018 11/24/17 11/24/18  Orbie Pyo, MD  levofloxacin (LEVAQUIN) 500 MG tablet Take 500 mg by mouth daily. 05/29/18 06/05/18  [provider]    Allergies Diphenhydramine hcl; Penicillins; Captopril; Enalapril maleate; Tape; Terfenadine; Augmentin [amoxicillin-pot clavulanate]; and Biaxin [clarithromycin]  Family History  Problem Relation Age of Onset  . Hypertension Mother   . Heart disease Mother   . Cancer Mother   . Stroke Father   . Hypertension Father   . Breast cancer Sister     Social History Social History   Tobacco Use  . Smoking status: Never Smoker  . Smokeless tobacco: Never Used  Substance Use Topics  . Alcohol use: No    Alcohol/week: 0.0 standard drinks  . Drug use: No    Review of Systems  Constitutional: Positive for chills  yesterday. Eyes: No visual changes. ENT: No sore throat. Cardiovascular: As above Respiratory: As above Gastrointestinal: No abdominal pain.    No constipation. Genitourinary: Negative for dysuria. Musculoskeletal: Negative for back pain. Skin: Negative for rash. Neurological: Negative for headaches, focal weakness or numbness.   ____________________________________________   PHYSICAL EXAM:  VITAL SIGNS: ED Triage Vitals [05/30/18 0651]  Enc Vitals Group     BP (!) 147/82     Pulse Rate 92     Resp 18     Temp 98.4 F (36.9 C)     Temp Source Oral     SpO2      Weight      Height      Head Circumference      Peak Flow      Pain Score 0     Pain Loc      Pain Edu?      Excl. in [place]?     Constitutional: Alert and oriented. Well appearing and in no acute distress. Eyes: Conjunctivae are normal.  Head: Atraumatic. Nose: No congestion/rhinnorhea. Mouth/Throat: Mucous membranes are moist.  Neck: No stridor.   Cardiovascular: Normal rate, regular rhythm. Grossly normal heart sounds.  Mild tenderness to palpation to the  anterior lower left chest wall without any crepitus or deformity. Respiratory: Normal respiratory effort.  No retractions. Lungs CTAB. Gastrointestinal: Soft and nontender. No distention. No CVA tenderness. Musculoskeletal: No lower extremity tenderness nor edema.  No joint effusions.  Mild tenderness palpation to the left-sided thoracic back, left side, without any deformity or crepitus.  Tenderness is mild and without any point tenderness. Neurologic:  Normal speech and language. No gross focal neurologic deficits are appreciated. Skin:  Skin is warm, dry and intact. No rash noted. Psychiatric: Mood and affect are normal. Speech and behavior are normal.  ____________________________________________   LABS (all labs ordered are listed, but only abnormal results are displayed)  Labs Reviewed  CBC WITH DIFFERENTIAL/PLATELET - Abnormal; Notable for the following components:      Result Value   WBC 22.2 (*)    Neutro Abs 18.7 (*)    Monocytes Absolute 1.6 (*)    Abs Immature Granulocytes 0.16 (*)    All other components within normal limits  COMPREHENSIVE METABOLIC PANEL - Abnormal; Notable for the following components:   Sodium 134 (*)    Chloride 94 (*)    Glucose, Bld 126 (*)    GFR calc non Af Amer 51 (*)    GFR calc Af Amer 59 (*)    Anion gap 18 (*)    All other components within normal limits  TROPONIN I - Abnormal; Notable for the following components:   Troponin I 0.06 (*)    All other components within normal limits  LIPASE, BLOOD  LACTIC ACID, PLASMA  LACTIC ACID, PLASMA   ____________________________________________  EKG  ED ECG REPORT I, Doran Stabler, the attending physician, personally viewed and interpreted this ECG.   Date: 05/30/2018  EKG Time: 0745  Rate: 87  Rhythm: atrial fibrillation, rate 87.  PVCs  Axis: Normal  Intervals:nonspecific intraventricular conduction delay  ST&T Change: T wave inversions in aVL.  No ST segment elevation or  depression. New QRS widening in 2 3 and aVF. ____________________________________________  RADIOLOGY  X-ray read with likely chronic lung disease without obvious acute infiltrate. ____________________________________________   PROCEDURES  Procedure(s) performed:   Procedures  Critical Care performed:   ____________________________________________   INITIAL IMPRESSION / ASSESSMENT AND PLAN / ED  COURSE  Pertinent labs & imaging results that were available during my care of the patient were reviewed by me and considered in my medical decision making (see chart for details).  Differential diagnosis includes, but is not limited to, ACS, aortic dissection, pulmonary embolism, cardiac tamponade, pneumothorax, pneumonia, pericarditis, myocarditis, GI-related causes including esophagitis/gastritis, and musculoskeletal chest wall pain.   As part of my medical decision making, I reviewed the following data within the electronic MEDICAL RECORD NUMBER Notes from prior ED visits  ----------------------------------------- 8:52 AM on 05/30/2018 -----------------------------------------  Patient at this time with oxygen desaturation to 86%.  Clinical picture fits pneumonia.  Upper respiratory symptoms of chronic cough, runny nose as well as left-sided chest pain where she has had pneumonia in the past.  Placed on 2 L of nasal cannula oxygen.  Patient says that she does take oxygen at night but does not need it during the day.  Patient also reports that she has had past allergic reaction to penicillin and amoxicillin with a rash but does not remember any facial swelling or difficult he breathing.  Patient to be admitted for pneumonia.  Elevated white count as well to 22.  Patient aware of diagnosis as well as treatment plan willing to comply. ____________________________________________   FINAL CLINICAL IMPRESSION(S) / ED DIAGNOSES  Community-acquired pneumonia.  NEW MEDICATIONS STARTED DURING THIS  VISIT:  New Prescriptions   No medications on file     Note:  This document was prepared using Dragon voice recognition software and may include unintentional dictation errors.     Thurlow Gallaga, Randall An, MD 05/30/18 [phone]  Less likely to be pulmonary embolus.  Patient with cough upper respiratory symptoms and elevated white blood cell count.  Previous pneumonia with similar symptoms.  Chest x-ray without call of pneumonia from radiology but with "increased markings at the bases."  On my review of the x-ray therapies to be more dramatically increased markings to the left lower base.  Previous pneumonia on the left side.  Signed out to Dr. Jerelyn Charles.    Orbie Pyo, MD 05/30/18 [phone]

## 2018-05-30 NOTE — H&P (Signed)
[facility] [place] NAME: Janice Perkins    MR#:  [id number]  DATE OF BIRTH:  1933/08/02  DATE OF ADMISSION:  05/30/2018  PRIMARY CARE PHYSICIAN: [name], MD   REQUESTING/REFERRING PHYSICIAN: dr Dineen Kid  CHIEF COMPLAINT:   Cough and shortness of breath HISTORY OF PRESENT ILLNESS:  Janice Perkins  is a 84 y.o. female with a known history of chronic atrial fibrillation who presents to the ER due to shortness of breath and cough.  Patient has a productive cough that is greenish in nature.  She had one blood-tinged sputum.  She has had several episodes and pneumonia in the past. She denies fever or chills.  She denies recent travel history.  PAST MEDICAL HISTORY:   Past Medical History:  Diagnosis Date  . Asthma   . Atrial fibrillation ([place])   . Breast cancer ([place]) 2011  . Bronchitis 04/2015  . Cancer [facility]) 2011   breast  . COPD (chronic obstructive pulmonary disease) ([place])   . Hypertension   . Shingles    10/2015    PAST SURGICAL HISTORY:   Past Surgical History:  Procedure Laterality Date  . BREAST EXCISIONAL BIOPSY Right 11/29/13   two areas FAT NECROSIS  . BREAST EXCISIONAL BIOPSY Right 10/30/2009   lumpectomy - radiation    SOCIAL HISTORY:   Social History   Tobacco Use  . Smoking status: Never Smoker  . Smokeless tobacco: Never Used  Substance Use Topics  . Alcohol use: No    Alcohol/week: 0.0 standard drinks    FAMILY HISTORY:   Family History  Problem Relation Age of Onset  . Hypertension Mother   . Heart disease Mother   . Cancer Mother   . Stroke Father   . Hypertension Father   . Breast cancer Sister     DRUG ALLERGIES:   Allergies  Allergen Reactions  . Diphenhydramine Hcl Rash  . Penicillins Hives    .Has patient had a PCN reaction causing immediate rash, facial/tongue/throat swelling, SOB or lightheadedness with hypotension: Unknown Has patient had a PCN reaction causing severe rash  involving mucus membranes or skin necrosis: Unknown Has patient had a PCN reaction that required hospitalization: Unknown Has patient had a PCN reaction occurring within the last 10 years: Unknown If all of the above answers are "NO", then may proceed with Cephalosporin use.   . Captopril Other (See Comments)  . Enalapril Maleate     Other reaction(s): Unknown  . Tape Itching  . Terfenadine Other (See Comments)  . Augmentin [Amoxicillin-Pot Clavulanate] Rash    Has patient had a PCN reaction causing immediate rash, facial/tongue/throat swelling, SOB or lightheadedness with hypotension: Unknown Has patient had a PCN reaction causing severe rash involving mucus membranes or skin necrosis: Unknown Has patient had a PCN reaction that required hospitalization: Unknown Has patient had a PCN reaction occurring within the last 10 years: Unknown If all of the above answers are "NO", then may proceed with Cephalosporin use.  . Biaxin [Clarithromycin] Rash, Other (See Comments) and Hives    Other reaction(s): Unknown    REVIEW OF SYSTEMS:   Review of Systems  Constitutional: Negative.  Negative for chills, fever and malaise/fatigue.  HENT: Negative.  Negative for ear discharge, ear pain, hearing loss, nosebleeds and sore throat.   Eyes: Negative.  Negative for blurred vision and pain.  Respiratory: Positive for cough and shortness of breath. Negative for hemoptysis and wheezing.   Cardiovascular: Negative.  Negative  for chest pain, palpitations and leg swelling.  Gastrointestinal: Negative.  Negative for abdominal pain, blood in stool, diarrhea, nausea and vomiting.  Genitourinary: Negative.  Negative for dysuria.  Musculoskeletal: Negative.  Negative for back pain.  Skin: Negative.   Neurological: Negative for dizziness, tremors, speech change, focal weakness, seizures and headaches.  Endo/Heme/Allergies: Negative.  Does not bruise/bleed easily.  Psychiatric/Behavioral: Negative.  Negative  for depression, hallucinations and suicidal ideas.    MEDICATIONS AT HOME:   Prior to Admission medications   Medication Sig Start Date End Date Taking? Authorizing Provider  albuterol (ACCUNEB) 1.25 MG/3ML nebulizer solution Inhale 3 mLs into the lungs every 6 (six) hours as needed for wheezing.    Yes [provider]  albuterol (PROVENTIL HFA;VENTOLIN HFA) 108 (90 Base) MCG/ACT inhaler Inhale 1-2 puffs into the lungs every 6 (six) hours as needed for wheezing or shortness of breath.   Yes [provider]  ALPRAZolam (XANAX) 0.25 MG tablet Take 0.25 mg by mouth daily.    Yes [provider]  aspirin EC 81 MG tablet Take 81 mg by mouth daily.   Yes [provider]  atorvastatin (LIPITOR) 20 MG tablet Take 20 mg by mouth at bedtime.    Yes [provider]  cholecalciferol (VITAMIN D) 1000 units tablet Take 1,000 Units by mouth daily.   Yes [provider]  diltiazem (DILACOR XR) 120 MG 24 hr capsule Take 120 mg by mouth daily.   Yes [provider]  esomeprazole (NEXIUM) 40 MG capsule Take 40 mg by mouth 2 (two) times daily before a meal.    Yes [provider]  ferrous sulfate 325 (65 FE) MG tablet Take 325 mg by mouth daily with breakfast.   Yes [provider]  fluticasone (FLONASE) 50 MCG/ACT nasal spray Place 2 sprays into both nostrils daily.    Yes [provider]  Fluticasone-Salmeterol (ADVAIR) 250-50 MCG/DOSE AEPB Inhale 1 puff into the lungs 2 (two) times daily.   Yes [provider]  hydrALAZINE (APRESOLINE) 25 MG tablet Take 1 tablet (25 mg total) by mouth every 6 (six) hours. 12/22/16  Yes Gouru, Illene Silver, MD  HYDROcodone-acetaminophen (NORCO/VICODIN) 5-325 MG tablet Take 1 tablet by mouth 2 (two) times daily.    Yes [provider]  levothyroxine (SYNTHROID, LEVOTHROID) 75 MCG tablet Take 1 tablet (75 mcg total) by mouth daily before breakfast. 12/23/16  Yes Gouru, Aruna, MD   magnesium oxide (MAG-OX) 400 MG tablet Take 400 mg by mouth 2 (two) times daily.   Yes [provider]  montelukast (SINGULAIR) 10 MG tablet Take 10 mg by mouth at bedtime.    Yes [provider]  ondansetron (ZOFRAN) 4 MG tablet Take 1 tablet by mouth daily as needed for nausea.    Yes [provider]  senna (SENOKOT) 8.6 MG TABS tablet Take 1 tablet by mouth daily as needed for mild constipation.   Yes [provider]  spironolactone (ALDACTONE) 25 MG tablet Take 25 mg by mouth daily.   Yes [provider]  apixaban (ELIQUIS) 5 MG TABS tablet Take 5 mg by mouth 2 (two) times daily. 05/20/18   [provider]  cloNIDine (CATAPRES) 0.1 MG tablet Take 1 tablet (0.1 mg total) by mouth 2 (two) times daily as needed (Take as needed for systolic blood pressure (top number) over 170.  Hold if diastolic (bottom number) is below 70.). Patient not taking: Reported on 03/24/2018 11/24/17 11/24/18  Schaevitz, Randall An, MD  levofloxacin (LEVAQUIN) 500 MG tablet Take 500 mg by mouth daily. 05/29/18 06/05/18  [provider]      VITAL SIGNS:  Blood pressure 129/66, pulse 74, temperature 98.4 F (36.9 C), temperature source Oral, resp. rate 18, SpO2 93 %.  PHYSICAL EXAMINATION:   Physical Exam  Constitutional: She is oriented to person, place, and time. No distress.  HENT:  Head: Normocephalic.  Eyes: No scleral icterus.  Neck: Normal range of motion. Neck supple. No JVD present. No tracheal deviation present.  Cardiovascular: Normal rate and normal heart sounds. Exam reveals no gallop and no friction rub.  No murmur heard. Irr, irr  Pulmonary/Chest: Effort normal and breath sounds normal. No respiratory distress. She has no wheezes. She has no rales. She exhibits no tenderness.  Abdominal: Soft. Bowel sounds are normal. She exhibits no distension and no mass. There is no tenderness. There is no rebound and no guarding.  Musculoskeletal:  Normal range of motion. She exhibits no edema.  Neurological: She is alert and oriented to person, place, and time.  Skin: Skin is warm. No rash noted. No erythema.  Psychiatric: Judgment normal.      LABORATORY PANEL:   CBC Recent Labs  Lab 05/30/18 0740  WBC 22.2*  HGB 12.9  HCT 38.2  PLT 266   ------------------------------------------------------------------------------------------------------------------  Chemistries  Recent Labs  Lab 05/30/18 0740  NA 134*  K 3.9  CL 94*  CO2 22  GLUCOSE 126*  BUN 17  CREATININE 0.99  CALCIUM 9.2  AST 30  ALT 16  ALKPHOS 49  BILITOT 0.9   ------------------------------------------------------------------------------------------------------------------  Cardiac Enzymes Recent Labs  Lab 05/30/18 0740  TROPONINI 0.06*   ------------------------------------------------------------------------------------------------------------------  RADIOLOGY:  Dg Chest 2 View  Result Date: 05/30/2018 CLINICAL DATA:  Left side pain, cough. EXAM: CHEST - 2 VIEW COMPARISON:  11/17/2017 FINDINGS: Mild cardiomegaly. Prominent interstitial markings within the lungs bilaterally, likely chronic lung disease/fibrosis. This is most pronounced in the lung bases. No acute confluent airspace opacity or effusion. No acute bony abnormality. IMPRESSION: Cardiomegaly. Chronic increased markings in the bases, likely chronic lung disease/fibrosis. No active disease. Electronically Signed   By: Rolm Baptise M.D.   On: 05/30/2018 08:20    EKG:  Atrial fibrillation no ST elevation or depression  IMPRESSION AND PLAN:   84 year old female with chronic atrial fibrillation and COPD who presents to the emergency room with productive cough and shortness of breath.  1.  Acute hypoxic respiratory failure with presumed pneumonia Wean oxygen as tolerated CT chest rule out PE  2.  Sepsis due to pneumonia: She presented with leukocytosis and tachypnea . Sepsis  due to pneumonia . start Levaquin for 5 days  3.  Chronic atrial fibrillation: Continue Eliquis and diltiazem  4.  Hypothyroidism: Continue Synthroid  5.  Essential hypertension: Continue diltiazem  6.  Hyperlipidemia: Continue Lipitor    All the records are reviewed and case discussed with ED provider. Management plans discussed with the patient and she is in agreement  CODE STATUS: Full  TOTAL TIME TAKING CARE OF THIS PATIENT: 43 minutes.    Martina Brodbeck M.D on 05/30/2018 at 9:20 AM  Between 7am to 6pm - Pager - [phone]  After 6pm go to [email or web address] - password [email or web address]  Office  [phone]  CC: Primary care physician; [name], MD

## 2018-05-30 NOTE — Progress Notes (Signed)
Pharmacy Antibiotic Note  Janice Perkins is a 84 y.o. female admitted on 05/30/2018 with pneumonia.  Pharmacy has been consulted for levofloxacin dosing.  Plan: Levofloxacin 750 mg IV q48h based on current renal function.    Temp (24hrs), Avg:98.8 F (37.1 C), Min:98.4 F (36.9 C), Max:99.1 F (37.3 C)  Recent Labs  Lab 05/23/18 1224 05/30/18 0740 05/30/18 0817  WBC 12.6* 22.2*  --   CREATININE 0.89 0.99  --   LATICACIDVEN  --   --  2.4*    Estimated Creatinine Clearance: 38.1 mL/min (by C-G formula based on SCr of 0.99 mg/dL).    Allergies  Allergen Reactions  . Diphenhydramine Hcl Rash  . Penicillins Hives    .Has patient had a PCN reaction causing immediate rash, facial/tongue/throat swelling, SOB or lightheadedness with hypotension: Unknown Has patient had a PCN reaction causing severe rash involving mucus membranes or skin necrosis: Unknown Has patient had a PCN reaction that required hospitalization: Unknown Has patient had a PCN reaction occurring within the last 10 years: Unknown If all of the above answers are "NO", then may proceed with Cephalosporin use.   . Captopril Other (See Comments)  . Enalapril Maleate     Other reaction(s): Unknown  . Tape Itching  . Terfenadine Other (See Comments)  . Augmentin [Amoxicillin-Pot Clavulanate] Rash    Has patient had a PCN reaction causing immediate rash, facial/tongue/throat swelling, SOB or lightheadedness with hypotension: Unknown Has patient had a PCN reaction causing severe rash involving mucus membranes or skin necrosis: Unknown Has patient had a PCN reaction that required hospitalization: Unknown Has patient had a PCN reaction occurring within the last 10 years: Unknown If all of the above answers are "NO", then may proceed with Cephalosporin use.  . Biaxin [Clarithromycin] Rash, Other (See Comments) and Hives    Other reaction(s): Unknown    Antimicrobials this admission: Levofloxacin 10/14>>   Dose  adjustments this admission:   Microbiology results: 10/14 BCx: sent   Thank you for allowing pharmacy to be a part of this patient's care.  Rocky Morel 05/30/2018 9:31 AM

## 2018-05-30 NOTE — Progress Notes (Signed)
Chaplain responded to an OR for an AD. Pt is alert and talkative. Pt talked about her husban who is a Company secretary and her family which include ministers. Chaplain asked if Patient requested and AD. Pt said she has a LW and did not request . Chaplain asked if she would like prayer and she said if [facility] the nurse doesn't mind. RN consented and Chaplain prayed for Pt, family and care team.    05/30/18 1300  Clinical Encounter Type  Visited With Patient  Visit Type Initial  Referral From Physician  Spiritual Encounters  Spiritual Needs Brochure;Prayer

## 2018-05-30 NOTE — Progress Notes (Signed)
CODE SEPSIS - PHARMACY COMMUNICATION  **Broad Spectrum Antibiotics should be administered within 1 hour of Sepsis diagnosis**  Time Code Sepsis Called/Page Received: 0981  Antibiotics Ordered: Cefepime   Time of 1st antibiotic administration: 0937 - Cefepime   Additional action taken by pharmacy: none indicated  If necessary, Name of Provider/Nurse Contacted: N/A    Simpson,Michael L ,PharmD Clinical Pharmacist  05/30/2018  9:00 AM

## 2018-05-30 NOTE — ED Triage Notes (Signed)
Patient states "when I spit up this morning there was blood in it".

## 2018-05-31 LAB — BASIC METABOLIC PANEL
ANION GAP: 6 (ref 5–15)
BUN: 13 mg/dL (ref 8–23)
CALCIUM: 9.1 mg/dL (ref 8.9–10.3)
CO2: 29 mmol/L (ref 22–32)
Chloride: 96 mmol/L — ABNORMAL LOW (ref 98–111)
Creatinine, Ser: 0.98 mg/dL (ref 0.44–1.00)
GFR, EST NON AFRICAN AMERICAN: 52 mL/min — AB (ref >60)
Glucose, Bld: 109 mg/dL — ABNORMAL HIGH (ref 70–99)
Potassium: 4.1 mmol/L (ref 3.5–5.1)
Sodium: 131 mmol/L — ABNORMAL LOW (ref 135–145)

## 2018-05-31 LAB — CBC
HCT: 33.3 % — ABNORMAL LOW (ref 36.0–46.0)
Hemoglobin: 10.9 g/dL — ABNORMAL LOW (ref 12.0–15.0)
MCH: 29.9 pg (ref 26.0–34.0)
MCHC: 32.7 g/dL (ref 30.0–36.0)
MCV: 91.2 fL (ref 80.0–100.0)
NRBC: 0 % (ref 0.0–0.2)
PLATELETS: 227 10*3/uL (ref 150–400)
RBC: 3.65 MIL/uL — AB (ref 3.87–5.11)
RDW: 14.7 % (ref 11.5–15.5)
WBC: 15.5 10*3/uL — ABNORMAL HIGH (ref 4.0–10.5)

## 2018-05-31 MED ORDER — FUROSEMIDE 10 MG/ML IJ SOLN: 10.0000 mg | Freq: Once | INTRAMUSCULAR | Status: DC

## 2018-05-31 MED ORDER — FUROSEMIDE 10 MG/ML IJ SOLN: 20.0000 mg | Freq: Once | INTRAMUSCULAR | Status: DC

## 2018-05-31 MED ORDER — FUROSEMIDE 10 MG/ML IJ SOLN
20.0000 mg | Freq: Once | INTRAMUSCULAR | Status: AC
  Administered 2018-05-31: 20 mg via INTRAVENOUS
  Filled 2018-05-31: qty 2

## 2018-05-31 MED ORDER — LEVOFLOXACIN 750 MG PO TABS: 750.0000 mg | ORAL_TABLET | Freq: Every day | ORAL | 0 refills | Status: DC

## 2018-05-31 MED ORDER — LEVOFLOXACIN 250 MG PO TABS: 250.0000 mg | ORAL_TABLET | Freq: Every day | ORAL | 0 refills | Status: AC

## 2018-05-31 NOTE — Care Management CC44 (Signed)
Condition Code 44 Documentation Completed  Patient Details  Name: Janice Perkins MRN: [id number] Date of Birth: 1933/05/02   Condition Code 44 given:    Patient signature on Condition Code 44 notice:    Documentation of 2 MD's agreement:    Code 44 added to claim:       Shelbie Ammons, RN 05/31/2018, 1:24 PM

## 2018-05-31 NOTE — Care Management Note (Addendum)
Case Management Note  Patient Details  Name: Janice Perkins MRN: [id number] Date of Birth: 09-06-1932  Subjective/Objective:   Admitted to [facility] with the diagnosis of hypoxia. Lives with husband, [name] [phone]). Next appointment with Dr. Ginette Pitman is 06/22/18. Prescriptions are filled at [facility].   [facility] x 2 in the past. [facility] x 2 in the past. Home oxygen per [facility]. Uses at night and as needed. Rolling walker, shower bench, and nebulizer in the home. No falls. Decreased appetite. Husband will transport.               Action/Plan: Home Health services ordered, [facility]. Floydene Flock, [facility] representative updated.   Expected Discharge Date:  05/31/18               Expected Discharge Plan:     In-House Referral:   yes  Discharge planning Services   yes  Post Acute Care Choice:   yes Choice offered to:   patient  DME Arranged:    DME Agency:     HH Arranged:   yes HH Agency:   [facility]  Status of Service:     If discussed at [place] of Stay Meetings, dates discussed:    Additional Comments:  Shelbie Ammons, RN MSN CCM Care Management [phone] 05/31/2018, 10:26 AM

## 2018-05-31 NOTE — Discharge Summary (Addendum)
[facility] [place] NAME: Janice Perkins    MR#:  [id number]  DATE OF BIRTH:  09-Apr-1933  DATE OF ADMISSION:  05/30/2018 ADMITTING PHYSICIAN: Bettey Costa, MD  DATE OF DISCHARGE: 05/31/2018  PRIMARY CARE PHYSICIAN: [name], MD    ADMISSION DIAGNOSIS:  Hypoxia [R09.02] Community acquired pneumonia, unspecified laterality [J18.9]  DISCHARGE DIAGNOSIS:  Active Problems:   Hypoxia   SECONDARY DIAGNOSIS:   Past Medical History:  Diagnosis Date  . Asthma   . Atrial fibrillation ([place])   . Breast cancer ([place]) 2011  . Bronchitis 04/2015  . Cancer [facility]) 2011   breast  . COPD (chronic obstructive pulmonary disease) ([place])   . Hypertension   . Shingles    10/2015    HOSPITAL COURSE:   84 year old female with history of chronic atrial fibrillation currently not on Eliquis due to suspected GI bleed who presented to the emergency room with productive cough and shortness of breath.  1.  Acute hypoxic respiratory failure with community-acquired pneumonia: Oxygen has been weaned CT chest was negative for PE  2.  Sepsis due to pneumonia: Patient presented with leukocytosis and tachypnea from pneumonia.  Sepsis has improved.  Patient will continue Levaquin for 5 days.  3.  Chronic atrial fibrillation: Patient reports having lower GI bleed and is currently off of Eliquis.  She will continue with diltiazem for heart rate control. She was advised about the risks of anticoagulation/atrial fibrillation and CVA risk. She will follow-up with her cardiologist at the end of the week.  4.  Hypothyroidism: Continue Synthroid   DISCHARGE CONDITIONS AND DIET:   Stable for discharge on heart healthy diet  CONSULTS OBTAINED:    DRUG ALLERGIES:   Allergies  Allergen Reactions  . Diphenhydramine Hcl Rash  . Penicillins Hives    .Has patient had a PCN reaction causing immediate rash, facial/tongue/throat swelling, SOB or lightheadedness with  hypotension: Unknown Has patient had a PCN reaction causing severe rash involving mucus membranes or skin necrosis: Unknown Has patient had a PCN reaction that required hospitalization: Unknown Has patient had a PCN reaction occurring within the last 10 years: Unknown If all of the above answers are "NO", then may proceed with Cephalosporin use.   . Captopril Other (See Comments)  . Enalapril Maleate     Other reaction(s): Unknown  . Tape Itching  . Terfenadine Other (See Comments)  . Augmentin [Amoxicillin-Pot Clavulanate] Rash    Has patient had a PCN reaction causing immediate rash, facial/tongue/throat swelling, SOB or lightheadedness with hypotension: Unknown Has patient had a PCN reaction causing severe rash involving mucus membranes or skin necrosis: Unknown Has patient had a PCN reaction that required hospitalization: Unknown Has patient had a PCN reaction occurring within the last 10 years: Unknown If all of the above answers are "NO", then may proceed with Cephalosporin use.  . Biaxin [Clarithromycin] Rash, Other (See Comments) and Hives    Other reaction(s): Unknown    DISCHARGE MEDICATIONS:   Allergies as of 05/31/2018      Reactions   Diphenhydramine Hcl Rash   Penicillins Hives   .Has patient had a PCN reaction causing immediate rash, facial/tongue/throat swelling, SOB or lightheadedness with hypotension: Unknown Has patient had a PCN reaction causing severe rash involving mucus membranes or skin necrosis: Unknown Has patient had a PCN reaction that required hospitalization: Unknown Has patient had a PCN reaction occurring within the last 10 years: Unknown If all of the above answers are "  NO", then may proceed with Cephalosporin use.   Captopril Other (See Comments)   Enalapril Maleate    Other reaction(s): Unknown   Tape Itching   Terfenadine Other (See Comments)   Augmentin [amoxicillin-pot Clavulanate] Rash   Has patient had a PCN reaction causing immediate  rash, facial/tongue/throat swelling, SOB or lightheadedness with hypotension: Unknown Has patient had a PCN reaction causing severe rash involving mucus membranes or skin necrosis: Unknown Has patient had a PCN reaction that required hospitalization: Unknown Has patient had a PCN reaction occurring within the last 10 years: Unknown If all of the above answers are "NO", then may proceed with Cephalosporin use.   Biaxin [clarithromycin] Rash, Other (See Comments), Hives   Other reaction(s): Unknown      Medication List    STOP taking these medications   apixaban 5 MG Tabs tablet Commonly known as:  ELIQUIS     TAKE these medications   albuterol 108 (90 Base) MCG/ACT inhaler Commonly known as:  PROVENTIL HFA;VENTOLIN HFA Inhale 1-2 puffs into the lungs every 6 (six) hours as needed for wheezing or shortness of breath.   albuterol 1.25 MG/3ML nebulizer solution Commonly known as:  ACCUNEB Inhale 3 mLs into the lungs every 6 (six) hours as needed for wheezing.   ALPRAZolam 0.25 MG tablet Commonly known as:  XANAX Take 0.25 mg by mouth daily.   aspirin EC 81 MG tablet Take 81 mg by mouth daily.   atorvastatin 20 MG tablet Commonly known as:  LIPITOR Take 20 mg by mouth at bedtime.   cholecalciferol 1000 units tablet Commonly known as:  VITAMIN D Take 1,000 Units by mouth daily.   cloNIDine 0.1 MG tablet Commonly known as:  CATAPRES Take 1 tablet (0.1 mg total) by mouth 2 (two) times daily as needed (Take as needed for systolic blood pressure (top number) over 170.  Hold if diastolic (bottom number) is below 70.).   diltiazem 120 MG 24 hr capsule Commonly known as:  DILACOR XR Take 120 mg by mouth daily.   esomeprazole 40 MG capsule Commonly known as:  NEXIUM Take 40 mg by mouth 2 (two) times daily before a meal.   ferrous sulfate 325 (65 FE) MG tablet Take 325 mg by mouth daily with breakfast.   fluticasone 50 MCG/ACT nasal spray Commonly known as:  FLONASE Place 2  sprays into both nostrils daily.   Fluticasone-Salmeterol 250-50 MCG/DOSE Aepb Commonly known as:  ADVAIR Inhale 1 puff into the lungs 2 (two) times daily.   hydrALAZINE 25 MG tablet Commonly known as:  APRESOLINE Take 1 tablet (25 mg total) by mouth every 6 (six) hours.   HYDROcodone-acetaminophen 5-325 MG tablet Commonly known as:  NORCO/VICODIN Take 1 tablet by mouth 2 (two) times daily.   levofloxacin 250 MG tablet Commonly known as:  LEVAQUIN Take 1 tablet (250 mg total) by mouth daily for 4 days. What changed:    medication strength  how much to take   levothyroxine 75 MCG tablet Commonly known as:  SYNTHROID, LEVOTHROID Take 1 tablet (75 mcg total) by mouth daily before breakfast.   magnesium oxide 400 MG tablet Commonly known as:  MAG-OX Take 400 mg by mouth 2 (two) times daily.   montelukast 10 MG tablet Commonly known as:  SINGULAIR Take 10 mg by mouth at bedtime.   ondansetron 4 MG tablet Commonly known as:  ZOFRAN Take 1 tablet by mouth daily as needed for nausea.   senna 8.6 MG Tabs tablet Commonly known  asDonavan Burnet Take 1 tablet by mouth daily as needed for mild constipation.   spironolactone 25 MG tablet Commonly known as:  ALDACTONE Take 25 mg by mouth daily.         Today   CHIEF COMPLAINT:  Doing okay this morning.  Shortness of breath has improved   VITAL SIGNS:  Blood pressure 137/65, pulse 64, temperature 98.6 F (37 C), temperature source Oral, resp. rate 16, SpO2 (!) 7 %.   REVIEW OF SYSTEMS:  Review of Systems  Constitutional: Negative.  Negative for chills, fever and malaise/fatigue.  HENT: Negative.  Negative for ear discharge, ear pain, hearing loss, nosebleeds and sore throat.   Eyes: Negative.  Negative for blurred vision and pain.  Respiratory: Negative.  Negative for cough, hemoptysis, shortness of breath and wheezing.   Cardiovascular: Negative.  Negative for chest pain, palpitations and leg swelling.   Gastrointestinal: Negative.  Negative for abdominal pain, blood in stool, diarrhea, nausea and vomiting.  Genitourinary: Negative.  Negative for dysuria.  Musculoskeletal: Negative.  Negative for back pain.  Skin: Negative.   Neurological: Negative for dizziness, tremors, speech change, focal weakness, seizures and headaches.  Endo/Heme/Allergies: Negative.  Does not bruise/bleed easily.  Psychiatric/Behavioral: Negative.  Negative for depression, hallucinations and suicidal ideas.     PHYSICAL EXAMINATION:  GENERAL:  84 y.o.-year-old patient lying in the bed with no acute distress.  NECK:  Supple, no jugular venous distention. No thyroid enlargement, no tenderness.  LUNGS: Normal breath sounds bilaterally, no wheezing, rales,rhonchi  No use of accessory muscles of respiration.  CARDIOVASCULAR: S1, S2 normal. No murmurs, rubs, or gallops.  ABDOMEN: Soft, non-tender, non-distended. Bowel sounds present. No organomegaly or mass.  EXTREMITIES: No pedal edema, cyanosis, or clubbing.  PSYCHIATRIC: The patient is alert and oriented x 3.  SKIN: No obvious rash, lesion, or ulcer.   DATA REVIEW:   CBC Recent Labs  Lab 05/31/18 0355  WBC 15.5*  HGB 10.9*  HCT 33.3*  PLT 227    Chemistries  Recent Labs  Lab 05/30/18 0740 05/31/18 0355  NA 134* 131*  K 3.9 4.1  CL 94* 96*  CO2 22 29  GLUCOSE 126* 109*  BUN 17 13  CREATININE 0.99 0.98  CALCIUM 9.2 9.1  AST 30  --   ALT 16  --   ALKPHOS 49  --   BILITOT 0.9  --     Cardiac Enzymes Recent Labs  Lab 05/30/18 0740  TROPONINI 0.06*    Microbiology Results  @[email or web address] @  RADIOLOGY:  Dg Chest 2 View  Result Date: 05/30/2018 CLINICAL DATA:  Left side pain, cough. EXAM: CHEST - 2 VIEW COMPARISON:  11/17/2017 FINDINGS: Mild cardiomegaly. Prominent interstitial markings within the lungs bilaterally, likely chronic lung disease/fibrosis. This is most pronounced in the lung bases. No acute confluent airspace opacity or  effusion. No acute bony abnormality. IMPRESSION: Cardiomegaly. Chronic increased markings in the bases, likely chronic lung disease/fibrosis. No active disease. Electronically Signed   By: Rolm Baptise M.D.   On: 05/30/2018 08:20   Ct Angio Chest Pe W Or Wo Contrast  Result Date: 05/30/2018 CLINICAL DATA:  84 year old female with shortness breath. Breast cancer. Atrial fibrillation. COPD. Subsequent encounter. EXAM: CT ANGIOGRAPHY CHEST WITH CONTRAST TECHNIQUE: Multidetector CT imaging of the chest was performed using the standard protocol during bolus administration of intravenous contrast. Multiplanar CT image reconstructions and MIPs were obtained to evaluate the vascular anatomy. CONTRAST:  67mL OMNIPAQUE IOHEXOL 350 MG/ML SOLN COMPARISON:  05/30/2018 chest  x-ray.  03/29/2018 CT chest. FINDINGS: Cardiovascular: No pulmonary embolus detected. Cardiomegaly. Coronary artery calcification. Atherosclerotic changes thoracic aorta without aneurysm or dissection. Mediastinum/Nodes: Multiple mediastinal and hilar top-normal to minimally prominent size lymph nodes, most which are stable, others have changed minimally. Largest lymph node aortic pulmonary window region which short axis dimension of 1.6 cm versus prior 1.2 cm. Right supraclavicular lymph node which short axis dimension of 1 cm versus prior 0.9 cm. No thyroid or esophageal abnormality noted. Lungs/Pleura: Interval development of bilateral parenchymal changes greater within the left lung. Slight thickening of fissures and small amount of pleural fluid. Question possibility of mild congestive heart failure? Infectious infiltrate could not be excluded in proper clinical setting. Peribronchial thickening may represent changes of bronchitis. No large central obstructing lesion. Upper Abdomen: Hyperplasia adrenal glands. Fatty liver with slightly lobular contour possibly representing cirrhosis. Musculoskeletal: No osseous destructive lesion. Post left shoulder  replacement. Other: Post therapy changes right breast similar to prior exam. Axillary lymph nodes and subpectoral lymph nodes slightly more notable on left without change. Review of the MIP images confirms the above findings. IMPRESSION: 1. No pulmonary embolus detected. 2. Interval development of bilateral parenchymal changes greater within the left lung. Slight thickening of fissures and small amount of pleural fluid. Question possibility of mild congestive heart failure? Infectious infiltrate could not be excluded in proper clinical setting. Peribronchial thickening may represent changes of bronchitis. 3. Top-normal to slightly enlarged mediastinal and hilar lymph nodes. Majority are stable although some have increased slightly in size (as noted above). It is possible this is related to reactive changes rather than malignancy. Stability can be confirmed on follow-up. 4. Post therapy changes right breast. Similar appearance of left axillary/sub pectoralis lymph nodes. 5. Cardiomegaly.  Coronary artery calcifications 6. Slight irregularity liver contour possibly representing cirrhosis. 7. Bilateral adrenal gland hyperplasia similar to prior exam. Aortic Atherosclerosis (ICD10-I70.0).  Emphysema (ICD10-J43.9). Electronically Signed   By: Genia Del M.D.   On: 05/30/2018 12:53      Allergies as of 05/31/2018      Reactions   Diphenhydramine Hcl Rash   Penicillins Hives   .Has patient had a PCN reaction causing immediate rash, facial/tongue/throat swelling, SOB or lightheadedness with hypotension: Unknown Has patient had a PCN reaction causing severe rash involving mucus membranes or skin necrosis: Unknown Has patient had a PCN reaction that required hospitalization: Unknown Has patient had a PCN reaction occurring within the last 10 years: Unknown If all of the above answers are "NO", then may proceed with Cephalosporin use.   Captopril Other (See Comments)   Enalapril Maleate    Other reaction(s):  Unknown   Tape Itching   Terfenadine Other (See Comments)   Augmentin [amoxicillin-pot Clavulanate] Rash   Has patient had a PCN reaction causing immediate rash, facial/tongue/throat swelling, SOB or lightheadedness with hypotension: Unknown Has patient had a PCN reaction causing severe rash involving mucus membranes or skin necrosis: Unknown Has patient had a PCN reaction that required hospitalization: Unknown Has patient had a PCN reaction occurring within the last 10 years: Unknown If all of the above answers are "NO", then may proceed with Cephalosporin use.   Biaxin [clarithromycin] Rash, Other (See Comments), Hives   Other reaction(s): Unknown      Medication List    STOP taking these medications   apixaban 5 MG Tabs tablet Commonly known as:  ELIQUIS     TAKE these medications   albuterol 108 (90 Base) MCG/ACT inhaler Commonly known as:  PROVENTIL HFA;VENTOLIN HFA Inhale 1-2 puffs into the lungs every 6 (six) hours as needed for wheezing or shortness of breath.   albuterol 1.25 MG/3ML nebulizer solution Commonly known as:  ACCUNEB Inhale 3 mLs into the lungs every 6 (six) hours as needed for wheezing.   ALPRAZolam 0.25 MG tablet Commonly known as:  XANAX Take 0.25 mg by mouth daily.   aspirin EC 81 MG tablet Take 81 mg by mouth daily.   atorvastatin 20 MG tablet Commonly known as:  LIPITOR Take 20 mg by mouth at bedtime.   cholecalciferol 1000 units tablet Commonly known as:  VITAMIN D Take 1,000 Units by mouth daily.   cloNIDine 0.1 MG tablet Commonly known as:  CATAPRES Take 1 tablet (0.1 mg total) by mouth 2 (two) times daily as needed (Take as needed for systolic blood pressure (top number) over 170.  Hold if diastolic (bottom number) is below 70.).   diltiazem 120 MG 24 hr capsule Commonly known as:  DILACOR XR Take 120 mg by mouth daily.   esomeprazole 40 MG capsule Commonly known as:  NEXIUM Take 40 mg by mouth 2 (two) times daily before a meal.    ferrous sulfate 325 (65 FE) MG tablet Take 325 mg by mouth daily with breakfast.   fluticasone 50 MCG/ACT nasal spray Commonly known as:  FLONASE Place 2 sprays into both nostrils daily.   Fluticasone-Salmeterol 250-50 MCG/DOSE Aepb Commonly known as:  ADVAIR Inhale 1 puff into the lungs 2 (two) times daily.   hydrALAZINE 25 MG tablet Commonly known as:  APRESOLINE Take 1 tablet (25 mg total) by mouth every 6 (six) hours.   HYDROcodone-acetaminophen 5-325 MG tablet Commonly known as:  NORCO/VICODIN Take 1 tablet by mouth 2 (two) times daily.   levofloxacin 250 MG tablet Commonly known as:  LEVAQUIN Take 1 tablet (250 mg total) by mouth daily for 4 days. What changed:    medication strength  how much to take   levothyroxine 75 MCG tablet Commonly known as:  SYNTHROID, LEVOTHROID Take 1 tablet (75 mcg total) by mouth daily before breakfast.   magnesium oxide 400 MG tablet Commonly known as:  MAG-OX Take 400 mg by mouth 2 (two) times daily.   montelukast 10 MG tablet Commonly known as:  SINGULAIR Take 10 mg by mouth at bedtime.   ondansetron 4 MG tablet Commonly known as:  ZOFRAN Take 1 tablet by mouth daily as needed for nausea.   senna 8.6 MG Tabs tablet Commonly known as:  SENOKOT Take 1 tablet by mouth daily as needed for mild constipation.   spironolactone 25 MG tablet Commonly known as:  ALDACTONE Take 25 mg by mouth daily.         Management plans discussed with the patient and she is in agreement. Stable for discharge home  Patient should follow up with pcp  CODE STATUS:     Code Status Orders  (From admission, onward)         Start     Ordered   05/30/18 1127  Full code  Continuous     05/30/18 1127        Code Status History    Date Active Date Inactive Code Status Order ID Comments User Context   11/15/2017 1829 11/21/2017 1821 Full Code [id number]  Vaughan Basta, MD Inpatient   05/16/2017 0041 05/17/2017 1824 Full Code  [id number]  Quintella Baton, MD ED   03/07/2017 1446 03/09/2017 1509 Full Code [id number]  Sparks, Leonie Douglas, MD  Inpatient   01/19/2017 0837 01/21/2017 1415 Full Code [id number]  Harrie Foreman, MD Inpatient   12/18/2016 0718 12/22/2016 1947 Full Code [id number]  Harrie Foreman, MD Inpatient      TOTAL TIME TAKING CARE OF THIS PATIENT: 38 minutes.    Note: This dictation was prepared with Dragon dictation along with smaller phrase technology. Any transcriptional errors that result from this process are unintentional.  Terea Neubauer M.D on 05/31/2018 at 9:36 AM  Between 7am to 6pm - Pager - [phone] After 6pm go to [email or web address] - password [email or web address]  Office  [phone]  CC: Primary care physician; [name], MD

## 2018-05-31 NOTE — Care Management Obs Status (Signed)
[facility] NOTIFICATION   Patient Details  Name: MELANI BRISBANE MRN: [id number] Date of Birth: 1933-04-08   Medicare Observation Status Notification Given:  Yes    Shelbie Ammons, RN 05/31/2018, 1:24 PM

## 2018-06-04 LAB — CULTURE, BLOOD (ROUTINE X 2)
Culture: NO GROWTH
Culture: NO GROWTH
Special Requests: ADEQUATE
Special Requests: ADEQUATE

## 2018-06-10 ENCOUNTER — Encounter
Admission: RE | Disposition: A | Discharge status: Home / Self Care | Payer: Self-pay | Source: Ambulatory Visit | Attending: Unknown Physician Specialty

## 2018-06-10 ENCOUNTER — Encounter: Payer: Self-pay | Admitting: Anesthesiology

## 2018-06-10 ENCOUNTER — Ambulatory Visit: Payer: Medicare Other | Admitting: Anesthesiology

## 2018-06-10 ENCOUNTER — Other Ambulatory Visit: Payer: Self-pay

## 2018-06-10 ENCOUNTER — Ambulatory Visit
Admission: RE | Admit: 2018-06-10 | Discharge: 2018-06-10 | Disposition: A | Discharge status: Home / Self Care | Payer: Medicare Other | Source: Ambulatory Visit | Attending: Unknown Physician Specialty | Admitting: Unknown Physician Specialty

## 2018-06-10 DIAGNOSIS — I10 Essential (primary) hypertension: Secondary | ICD-10-CM | POA: Insufficient documentation

## 2018-06-10 DIAGNOSIS — R195 Other fecal abnormalities: Secondary | ICD-10-CM | POA: Diagnosis present

## 2018-06-10 DIAGNOSIS — Z79891 Long term (current) use of opiate analgesic: Secondary | ICD-10-CM | POA: Insufficient documentation

## 2018-06-10 DIAGNOSIS — R197 Diarrhea, unspecified: Secondary | ICD-10-CM | POA: Diagnosis not present

## 2018-06-10 DIAGNOSIS — Z7982 Long term (current) use of aspirin: Secondary | ICD-10-CM | POA: Insufficient documentation

## 2018-06-10 DIAGNOSIS — J449 Chronic obstructive pulmonary disease, unspecified: Secondary | ICD-10-CM | POA: Insufficient documentation

## 2018-06-10 DIAGNOSIS — Z79899 Other long term (current) drug therapy: Secondary | ICD-10-CM | POA: Insufficient documentation

## 2018-06-10 DIAGNOSIS — Z853 Personal history of malignant neoplasm of breast: Secondary | ICD-10-CM | POA: Insufficient documentation

## 2018-06-10 DIAGNOSIS — K319 Disease of stomach and duodenum, unspecified: Secondary | ICD-10-CM | POA: Insufficient documentation

## 2018-06-10 DIAGNOSIS — Z79890 Hormone replacement therapy (postmenopausal): Secondary | ICD-10-CM | POA: Diagnosis not present

## 2018-06-10 DIAGNOSIS — R1013 Epigastric pain: Secondary | ICD-10-CM | POA: Insufficient documentation

## 2018-06-10 DIAGNOSIS — Z7951 Long term (current) use of inhaled steroids: Secondary | ICD-10-CM | POA: Diagnosis not present

## 2018-06-10 HISTORY — PX: ESOPHAGOGASTRODUODENOSCOPY (EGD) WITH PROPOFOL: SHX5813

## 2018-06-10 HISTORY — PX: COLONOSCOPY WITH PROPOFOL: SHX5780

## 2018-06-10 SURGERY — COLONOSCOPY WITH PROPOFOL
Anesthesia: General

## 2018-06-10 MED ORDER — PROPOFOL 500 MG/50ML IV EMUL
INTRAVENOUS | Status: DC | PRN
  Administered 2018-06-10: 170 ug/kg/min via INTRAVENOUS

## 2018-06-10 MED ORDER — SODIUM CHLORIDE 0.9 % IV SOLN
INTRAVENOUS | Status: DC
  Administered 2018-06-10: 12:00:00 via INTRAVENOUS

## 2018-06-10 MED ORDER — PROPOFOL 10 MG/ML IV BOLUS
INTRAVENOUS | Status: AC
  Filled 2018-06-10: qty 20

## 2018-06-10 MED ORDER — FENTANYL CITRATE (PF) 100 MCG/2ML IJ SOLN
INTRAMUSCULAR | Status: DC | PRN
  Administered 2018-06-10: 50 ug via INTRAVENOUS

## 2018-06-10 MED ORDER — LIDOCAINE 2% (20 MG/ML) 5 ML SYRINGE
INTRAMUSCULAR | Status: DC | PRN
  Administered 2018-06-10: 30 mg via INTRAVENOUS

## 2018-06-10 MED ORDER — SODIUM CHLORIDE 0.9 % IV SOLN: INTRAVENOUS | Status: DC

## 2018-06-10 MED ORDER — PROPOFOL 500 MG/50ML IV EMUL
INTRAVENOUS | Status: AC
  Filled 2018-06-10: qty 50

## 2018-06-10 MED ORDER — PROPOFOL 10 MG/ML IV BOLUS
INTRAVENOUS | Status: DC | PRN
  Administered 2018-06-10: 80 mg via INTRAVENOUS

## 2018-06-10 MED ORDER — ONDANSETRON HCL 4 MG/2ML IJ SOLN
INTRAMUSCULAR | Status: DC | PRN
  Administered 2018-06-10: 4 mg via INTRAVENOUS

## 2018-06-10 MED ORDER — ONDANSETRON HCL 4 MG/2ML IJ SOLN
INTRAMUSCULAR | Status: AC
  Filled 2018-06-10: qty 2

## 2018-06-10 MED ORDER — FENTANYL CITRATE (PF) 100 MCG/2ML IJ SOLN
INTRAMUSCULAR | Status: AC
  Filled 2018-06-10: qty 2

## 2018-06-10 NOTE — Anesthesia Preprocedure Evaluation (Addendum)
Anesthesia Evaluation  Patient identified by MRN, date of birth, ID band Patient awake    Reviewed: Allergy & Precautions, NPO status , Patient's Chart, lab work & pertinent test results, reviewed documented beta blocker date and time   Airway Mallampati: III  TM Distance: >3 FB     Dental  (+) Upper Dentures, Lower Dentures   Pulmonary asthma , pneumonia, resolved, COPD,           Cardiovascular hypertension, Pt. on medications      Neuro/Psych  Neuromuscular disease    GI/Hepatic   Endo/Other    Renal/GU Renal disease     Musculoskeletal  (+) Arthritis ,   Abdominal   Peds  Hematology   Anesthesia Other Findings Uses 2L O2 at nite. EKG shows LVH abd AF. EF 55-60. O2 sats on the low side - 96%.  Reproductive/Obstetrics                            Anesthesia Physical Anesthesia Plan  ASA: III  Anesthesia Plan: General   Post-op Pain Management:    Induction: Intravenous  PONV Risk Score and Plan:   Airway Management Planned:   Additional Equipment:   Intra-op Plan:   Post-operative Plan:   Informed Consent: I have reviewed the patients History and Physical, chart, labs and discussed the procedure including the risks, benefits and alternatives for the proposed anesthesia with the patient or authorized representative who has indicated his/her understanding and acceptance.     Plan Discussed with: CRNA  Anesthesia Plan Comments:         Anesthesia Quick Evaluation

## 2018-06-10 NOTE — Anesthesia Postprocedure Evaluation (Signed)
Anesthesia Post Note  Patient: Janice Perkins  Procedure(s) Performed: COLONOSCOPY WITH PROPOFOL (N/A ) ESOPHAGOGASTRODUODENOSCOPY (EGD) WITH PROPOFOL (N/A )  Patient location during evaluation: Endoscopy Anesthesia Type: General Level of consciousness: awake and alert Pain management: pain level controlled Vital Signs Assessment: post-procedure vital signs reviewed and stable Respiratory status: spontaneous breathing, nonlabored ventilation, respiratory function stable and patient connected to nasal cannula oxygen Cardiovascular status: blood pressure returned to baseline and stable Postop Assessment: no apparent nausea or vomiting Anesthetic complications: no     Last Vitals:  Vitals:   06/10/18 1231 06/10/18 1259  BP:  (!) 141/75  Pulse: 75   Resp: 14   Temp: (!) 36.1 C   SpO2: 93%     Last Pain:  Vitals:   06/10/18 1239  TempSrc:   PainSc: [place]

## 2018-06-10 NOTE — Op Note (Signed)
[facility] Gastroenterology Patient Name: Janice Perkins Procedure Date: 06/10/2018 11:44 AM MRN: [id number] Account #: [id number] Date of Birth: 1933-07-31 Admit Type: Outpatient Age: 84 Room: [facility] ENDO ROOM 1 Gender: Female Note Status: Finalized Procedure:            Colonoscopy Indications:          Generalized abdominal pain Providers:            Manya Silvas, MD Referring MD:         [name], MD (Referring MD) Medicines:            Propofol per Anesthesia Complications:        No immediate complications. Procedure:            Pre-Anesthesia Assessment:                       - After reviewing the risks and benefits, the patient                        was deemed in satisfactory condition to undergo the                        procedure.                       After obtaining informed consent, the colonoscope was                        passed under direct vision. Throughout the procedure,                        the patient's blood pressure, pulse, and oxygen                        saturations were monitored continuously. The                        Colonoscope was introduced through the anus with the                        intention of advancing to the cecum. The scope was                        advanced to the sigmoid colon before the procedure was                        aborted. Medications were given. The colonoscopy was                        performed with difficulty. The colonoscopy was                        performed with difficulty due to Large stool mass in                        sigmoid colon. Findings:      Large hard stool mass in sigmoid colon which could not be passed, very       hard stool mass. snare used but able to only pick at it and could not       make much dent in the  mass.      Dr. Vicente Males came to see the mass and agreed it could not be removed with a       scope and she will need to do repeat enemas to remove the mass      A  large amount of stool was found in the sigmoid colon, precluding       visualization. Impression:           - No specimens collected. Recommendation:       Repeat enemas, drink Miralax or Golytely.                       - The findings and recommendations were discussed with                        the patient's family. Manya Silvas, MD 06/10/2018 12:37:47 PM This report has been signed electronically. Number of Addenda: 0 Note Initiated On: 06/10/2018 11:44 AM Total Procedure Duration: 0 hours 13 minutes 21 seconds       [facility]

## 2018-06-10 NOTE — Op Note (Signed)
[facility] Gastroenterology Patient Name: Janice Perkins Procedure Date: 06/10/2018 11:45 AM MRN: [id number] Account #: [id number] Date of Birth: 30-Jun-1933 Admit Type: Outpatient Age: 84 Room: [facility] ENDO ROOM 1 Gender: Female Note Status: Finalized Procedure:            Upper GI endoscopy Indications:          Epigastric abdominal pain, Generalized abdominal pain Providers:            Manya Silvas, MD Referring MD:         [name], MD (Referring MD) Medicines:            Propofol per Anesthesia Complications:        No immediate complications. Procedure:            Pre-Anesthesia Assessment:                       - After reviewing the risks and benefits, the patient                        was deemed in satisfactory condition to undergo the                        procedure.                       After obtaining informed consent, the endoscope was                        passed under direct vision. Throughout the procedure,                        the patient's blood pressure, pulse, and oxygen                        saturations were monitored continuously. The Endoscope                        was introduced through the mouth, and advanced to the                        second part of duodenum. The upper GI endoscopy was                        accomplished without difficulty. The patient tolerated                        the procedure well. Findings:      Diffuse, white plaques were found in the upper third of the esophagus,       in the middle third of the esophagus and in the lower third of the       esophagus. candida yeast infection.      Localized mildly erythematous mucosa without bleeding was found in the       gastric antrum. Biopsies were taken with a cold forceps for histology.       Biopsies were taken with a cold forceps for Helicobacter pylori testing.      The examined duodenum was normal. Impression:           - Esophageal plaques were  found, consistent with  candidiasis.                       - Erythematous mucosa in the antrum. Biopsied.                       - Normal examined duodenum. Recommendation:       - Await pathology results. Manya Silvas, MD 06/10/2018 12:07:36 PM This report has been signed electronically. Number of Addenda: 0 Note Initiated On: 06/10/2018 11:45 AM      [facility]

## 2018-06-10 NOTE — Transfer of Care (Signed)
Immediate Anesthesia Transfer of Care Note  Patient: Janice Perkins  Procedure(s) Performed: COLONOSCOPY WITH PROPOFOL (N/A ) ESOPHAGOGASTRODUODENOSCOPY (EGD) WITH PROPOFOL (N/A )  Patient Location: PACU and Endoscopy Unit  Anesthesia Type:General  Level of Consciousness: drowsy  Airway & Oxygen Therapy: Patient Spontanous Breathing and Patient connected to nasal cannula oxygen  Post-op Assessment: Report given to RN and Post -op Vital signs reviewed and stable  Post vital signs: Reviewed and stable  Last Vitals:  Vitals Value Taken Time  BP    Temp    Pulse 75 06/10/2018 12:29 PM  Resp 14 06/10/2018 12:29 PM  SpO2 93 % 06/10/2018 12:29 PM  Vitals shown include unvalidated device data.  Last Pain:  Vitals:   06/10/18 1051  TempSrc: Tympanic  PainSc: 5          Complications: No apparent anesthesia complications

## 2018-06-10 NOTE — Anesthesia Post-op Follow-up Note (Signed)
Anesthesia QCDR form completed.        

## 2018-06-10 NOTE — H&P (Signed)
Primary Care Physician:  [name], MD Primary Gastroenterologist:  Dr. Vira Agar  Pre-Procedure History & Physical: HPI:  Janice Perkins is a 84 y.o. female is here for an endoscopy and colonoscopy.  Done for heme positive stool and abd pain.   Past Medical History:  Diagnosis Date  . Asthma   . Atrial fibrillation ([place])   . Breast cancer ([place]) 2011  . Bronchitis 04/2015  . Cancer [facility]) 2011   breast  . COPD (chronic obstructive pulmonary disease) ([place])   . Hypertension   . Shingles    10/2015    Past Surgical History:  Procedure Laterality Date  . BREAST EXCISIONAL BIOPSY Right 11/29/13   two areas FAT NECROSIS  . BREAST EXCISIONAL BIOPSY Right 10/30/2009   lumpectomy - radiation  . NASAL SINUS SURGERY      Prior to Admission medications   Medication Sig Start Date End Date Taking? Authorizing Provider  albuterol (ACCUNEB) 1.25 MG/3ML nebulizer solution Inhale 3 mLs into the lungs every 6 (six) hours as needed for wheezing.    Yes [provider]  albuterol (PROVENTIL HFA;VENTOLIN HFA) 108 (90 Base) MCG/ACT inhaler Inhale 1-2 puffs into the lungs every 6 (six) hours as needed for wheezing or shortness of breath.   Yes [provider]  ALPRAZolam (XANAX) 0.25 MG tablet Take 0.25 mg by mouth daily.    Yes [provider]  aspirin EC 81 MG tablet Take 81 mg by mouth daily.   Yes [provider]  atorvastatin (LIPITOR) 20 MG tablet Take 20 mg by mouth at bedtime.    Yes [provider]  cholecalciferol (VITAMIN D) 1000 units tablet Take 1,000 Units by mouth daily.   Yes [provider]  diltiazem (DILACOR XR) 120 MG 24 hr capsule Take 120 mg by mouth daily.   Yes [provider]  esomeprazole (NEXIUM) 40 MG capsule Take 40 mg by mouth 2 (two) times daily before a meal.    Yes [provider]  ferrous sulfate 325 (65 FE) MG tablet Take 325 mg by mouth daily with breakfast.   Yes [provider]   FLUoxetine (PROZAC) 10 MG tablet Take 10 mg by mouth daily.   Yes [provider]  fluticasone (FLONASE) 50 MCG/ACT nasal spray Place 2 sprays into both nostrils daily.    Yes [provider]  Fluticasone-Salmeterol (ADVAIR) 250-50 MCG/DOSE AEPB Inhale 1 puff into the lungs 2 (two) times daily.   Yes [provider]  hydrALAZINE (APRESOLINE) 25 MG tablet Take 1 tablet (25 mg total) by mouth every 6 (six) hours. 12/22/16  Yes Gouru, Illene Silver, MD  HYDROcodone-acetaminophen (NORCO/VICODIN) 5-325 MG tablet Take 1 tablet by mouth 2 (two) times daily.    Yes [provider]  levothyroxine (SYNTHROID, LEVOTHROID) 75 MCG tablet Take 1 tablet (75 mcg total) by mouth daily before breakfast. 12/23/16  Yes Gouru, Aruna, MD  magnesium oxide (MAG-OX) 400 MG tablet Take 400 mg by mouth 2 (two) times daily.   Yes [provider]  montelukast (SINGULAIR) 10 MG tablet Take 10 mg by mouth at bedtime.    Yes [provider]  ondansetron (ZOFRAN) 4 MG tablet Take 1 tablet by mouth daily as needed for nausea.    Yes [provider]  spironolactone (ALDACTONE) 25 MG tablet Take 25 mg by mouth daily.   Yes [provider]  cloNIDine (CATAPRES) 0.1 MG tablet Take 1 tablet (0.1 mg total) by mouth 2 (two) times daily as  needed (Take as needed for systolic blood pressure (top number) over 170.  Hold if diastolic (bottom number) is below 70.). Patient not taking: Reported on 03/24/2018 11/24/17 11/24/18  Orbie Pyo, MD  senna (SENOKOT) 8.6 MG TABS tablet Take 1 tablet by mouth daily as needed for mild constipation.    [provider]    Allergies as of 05/31/2018 - Review Complete 05/30/2018  Allergen Reaction Noted  . Diphenhydramine hcl Rash 11/09/2017  . Penicillins Hives 04/08/2015  . Captopril Other (See Comments) 04/08/2015  . Enalapril maleate  04/08/2015  . Tape Itching 04/08/2015  . Terfenadine Other (See Comments) 12/20/2013  .  Augmentin [amoxicillin-pot clavulanate] Rash 01/15/2015  . Biaxin [clarithromycin] Rash, Other (See Comments), and Hives 01/15/2015    Family History  Problem Relation Age of Onset  . Hypertension Mother   . Heart disease Mother   . Cancer Mother   . Stroke Father   . Hypertension Father   . Breast cancer Sister     Social History   Socioeconomic History  . Marital status: Married    Spouse name: Not on file  . Number of children: Not on file  . Years of education: Not on file  . Highest education level: Not on file  Occupational History  . Not on file  Social Needs  . Financial resource strain: Not on file  . Food insecurity:    Worry: Not on file    Inability: Not on file  . Transportation needs:    Medical: Not on file    Non-medical: Not on file  Tobacco Use  . Smoking status: Never Smoker  . Smokeless tobacco: Never Used  Substance and Sexual Activity  . Alcohol use: No    Alcohol/week: 0.0 standard drinks  . Drug use: No  . Sexual activity: Not on file  Lifestyle  . Physical activity:    Days per week: Not on file    Minutes per session: Not on file  . Stress: Not on file  Relationships  . Social connections:    Talks on phone: Not on file    Gets together: Not on file    Attends religious service: Not on file    Active member of club or organization: Not on file    Attends meetings of clubs or organizations: Not on file    Relationship status: Not on file  . Intimate partner violence:    Fear of current or ex partner: Not on file    Emotionally abused: Not on file    Physically abused: Not on file    Forced sexual activity: Not on file  Other Topics Concern  . Not on file  Social History Narrative  . Not on file    Review of Systems: See HPI, otherwise negative ROS  Physical Exam: BP (!) 156/93   Pulse 90   Temp (!) 97.4 F (36.3 C) (Tympanic)   Resp 20  General:   Alert,  pleasant and cooperative in NAD Head:  Normocephalic and  atraumatic. Neck:  Supple; no masses or thyromegaly. Lungs:  Clear throughout to auscultation.    Heart:  Regular rate and rhythm. Abdomen:  Soft, nontender and nondistended. Normal bowel sounds, without guarding, and without rebound.   Neurologic:  Alert and  oriented x4;  grossly normal neurologically.  Impression/Plan: STEFFIE WAGGONER is here for an endoscopy and colonoscopy to be performed for heme positive stool and diarrhea.  Risks, benefits, limitations, and alternatives regarding  endoscopy  and colonoscopy have been reviewed with the patient.  Questions have been answered.  All parties agreeable.   Gaylyn Cheers, MD  06/10/2018, 11:52 AM

## 2018-06-13 LAB — SURGICAL PATHOLOGY

## 2018-06-14 ENCOUNTER — Encounter: Payer: Self-pay | Admitting: Unknown Physician Specialty

## 2018-07-04 ENCOUNTER — Other Ambulatory Visit: Payer: Self-pay

## 2018-07-04 ENCOUNTER — Emergency Department: Payer: Medicare Other

## 2018-07-04 ENCOUNTER — Emergency Department
Admission: EM | Admit: 2018-07-04 | Discharge: 2018-07-04 | Disposition: A | Discharge status: Home / Self Care | Payer: Medicare Other | Attending: Emergency Medicine | Admitting: Emergency Medicine

## 2018-07-04 ENCOUNTER — Encounter: Payer: Self-pay | Admitting: Emergency Medicine

## 2018-07-04 DIAGNOSIS — Z79899 Other long term (current) drug therapy: Secondary | ICD-10-CM | POA: Insufficient documentation

## 2018-07-04 DIAGNOSIS — R0602 Shortness of breath: Secondary | ICD-10-CM | POA: Diagnosis not present

## 2018-07-04 DIAGNOSIS — J45909 Unspecified asthma, uncomplicated: Secondary | ICD-10-CM | POA: Insufficient documentation

## 2018-07-04 DIAGNOSIS — I10 Essential (primary) hypertension: Secondary | ICD-10-CM | POA: Diagnosis not present

## 2018-07-04 DIAGNOSIS — R531 Weakness: Secondary | ICD-10-CM | POA: Insufficient documentation

## 2018-07-04 DIAGNOSIS — J181 Lobar pneumonia, unspecified organism: Secondary | ICD-10-CM

## 2018-07-04 DIAGNOSIS — J189 Pneumonia, unspecified organism: Secondary | ICD-10-CM | POA: Diagnosis not present

## 2018-07-04 LAB — URINALYSIS, COMPLETE (UACMP) WITH MICROSCOPIC
BACTERIA UA: NONE SEEN
Bilirubin Urine: NEGATIVE
Glucose, UA: NEGATIVE mg/dL
HGB URINE DIPSTICK: NEGATIVE
Ketones, ur: NEGATIVE mg/dL
NITRITE: NEGATIVE
Protein, ur: 100 mg/dL — AB
SPECIFIC GRAVITY, URINE: 1.008 (ref 1.005–1.030)
pH: 7 (ref 5.0–8.0)

## 2018-07-04 LAB — BASIC METABOLIC PANEL
ANION GAP: 9 (ref 5–15)
BUN: 14 mg/dL (ref 8–23)
CALCIUM: 9.7 mg/dL (ref 8.9–10.3)
CO2: 29 mmol/L (ref 22–32)
Chloride: 92 mmol/L — ABNORMAL LOW (ref 98–111)
Creatinine, Ser: 0.9 mg/dL (ref 0.44–1.00)
GFR, EST NON AFRICAN AMERICAN: 58 mL/min — AB (ref >60)
GLUCOSE: 132 mg/dL — AB (ref 70–99)
Potassium: 3.8 mmol/L (ref 3.5–5.1)
Sodium: 130 mmol/L — ABNORMAL LOW (ref 135–145)

## 2018-07-04 LAB — CBC
HCT: 41.9 % (ref 36.0–46.0)
Hemoglobin: 14.2 g/dL (ref 12.0–15.0)
MCH: 29.9 pg (ref 26.0–34.0)
MCHC: 33.9 g/dL (ref 30.0–36.0)
MCV: 88.2 fL (ref 80.0–100.0)
PLATELETS: 206 10*3/uL (ref 150–400)
RBC: 4.75 MIL/uL (ref 3.87–5.11)
RDW: 14.4 % (ref 11.5–15.5)
WBC: 7.6 10*3/uL (ref 4.0–10.5)
nRBC: 0 % (ref 0.0–0.2)

## 2018-07-04 LAB — BRAIN NATRIURETIC PEPTIDE: B Natriuretic Peptide: 170 pg/mL — ABNORMAL HIGH (ref 0.0–100.0)

## 2018-07-04 LAB — TROPONIN I

## 2018-07-04 MED ORDER — ALPRAZOLAM 0.5 MG PO TABS
0.5000 mg | ORAL_TABLET | Freq: Once | ORAL | Status: AC
  Administered 2018-07-04: 0.5 mg via ORAL
  Filled 2018-07-04: qty 1

## 2018-07-04 MED ORDER — SODIUM CHLORIDE 0.9 % IV BOLUS
500.0000 mL | Freq: Once | INTRAVENOUS | Status: AC
  Administered 2018-07-04: 500 mL via INTRAVENOUS

## 2018-07-04 MED ORDER — LEVOFLOXACIN 750 MG PO TABS: 750.0000 mg | ORAL_TABLET | Freq: Every day | ORAL | 0 refills | Status: AC

## 2018-07-04 MED ORDER — ACETAMINOPHEN 500 MG PO TABS
1000.0000 mg | ORAL_TABLET | Freq: Once | ORAL | Status: AC
  Administered 2018-07-04: 1000 mg via ORAL
  Filled 2018-07-04: qty 2

## 2018-07-04 NOTE — ED Triage Notes (Signed)
Pt brought in to ED from home via [facility] with complaints of weakness, headache and chest pain, EMS reports that family told them 30 minutes PTA pt was not able to gather her thoughts. Pt reports "I am Shaky can you calm me down."

## 2018-07-04 NOTE — ED Notes (Signed)
ED Provider at bedside. 

## 2018-07-04 NOTE — ED Notes (Signed)
Patient transported to CT 

## 2018-07-04 NOTE — Discharge Instructions (Addendum)
Take the antibiotic as prescribed and finish the full course.  Follow-up with your doctor within 1 to 2 weeks.  Return to the ER immediately for new, worsening, persistent weakness, lightheadedness, confusion or feeling like you cannot think straight, worsening cough, shortness of breath, chest pain, fevers, or any other new or worsening symptoms that concern you.

## 2018-07-04 NOTE — ED Notes (Signed)
Family at bedside. 

## 2018-07-04 NOTE — ED Provider Notes (Signed)
[facility] Emergency Department Provider Note ____________________________________________   First MD Initiated Contact with Patient 07/04/18 1158     (approximate)  I have reviewed the triage vital signs and the nursing notes.   HISTORY  Chief Complaint Panic Attack; Chest Pain; and Weakness    HPI Janice Perkins is a 84 y.o. female with PMH as noted below who presents with a primary complaint of weakness, acute onset this morning, associated with feeling shaky as well as with the feeling like she could not think clearly.  She denies any speech disturbance.  She states that she feels very anxious and wants something to help her calm down.  She also reports some shortness of breath but denies chest pain, vomiting, or fever.  Past Medical History:  Diagnosis Date  . Asthma   . Atrial fibrillation ([place])   . Breast cancer ([place]) 2011  . Bronchitis 04/2015  . Cancer [facility]) 2011   breast  . COPD (chronic obstructive pulmonary disease) ([place])   . Hypertension   . Shingles    10/2015    Patient Active Problem List   Diagnosis Date Noted  . Hypoxia 05/30/2018  . Leukocytosis 04/05/2018  . Pneumonia 11/15/2017  . Pleural effusion 05/16/2017  . AF (paroxysmal atrial fibrillation) ([place]) 05/16/2017  . Breast cancer ([place]) 05/16/2017  . Dependence on nocturnal oxygen therapy 05/16/2017  . HTN (hypertension) 05/16/2017  . Generalized weakness 03/07/2017  . Hyponatremia 03/07/2017  . Dehydration 03/07/2017  . UTI (urinary tract infection) 03/07/2017  . HCAP (healthcare-associated pneumonia) 01/19/2017  . AKI (acute kidney injury) ([place]) 12/18/2016  . Primary cancer of upper outer quadrant of right female breast ([place]) 04/07/2016  . Lumbar radiculopathy 03/23/2016  . Spinal stenosis, lumbar region, with neurogenic claudication 03/23/2016  . DDD (degenerative disc disease), lumbar 01/14/2015  . Lumbosacral facet joint syndrome 01/14/2015  . Sacroiliac joint  dysfunction 01/14/2015  . Greater trochanteric bursitis 01/14/2015  . Bilateral occipital neuralgia 01/14/2015    Past Surgical History:  Procedure Laterality Date  . BREAST EXCISIONAL BIOPSY Right 11/29/13   two areas FAT NECROSIS  . BREAST EXCISIONAL BIOPSY Right 10/30/2009   lumpectomy - radiation  . COLONOSCOPY WITH PROPOFOL N/A 06/10/2018   Procedure: COLONOSCOPY WITH PROPOFOL;  Surgeon: Manya Silvas, MD;  Location: [facility] ENDOSCOPY;  Service: Endoscopy;  Laterality: N/A;  . ESOPHAGOGASTRODUODENOSCOPY (EGD) WITH PROPOFOL N/A 06/10/2018   Procedure: ESOPHAGOGASTRODUODENOSCOPY (EGD) WITH PROPOFOL;  Surgeon: Manya Silvas, MD;  Location: [facility] ENDOSCOPY;  Service: Endoscopy;  Laterality: N/A;  . NASAL SINUS SURGERY      Prior to Admission medications   Medication Sig Start Date End Date Taking? Authorizing Provider  albuterol (ACCUNEB) 1.25 MG/3ML nebulizer solution Inhale 3 mLs into the lungs every 6 (six) hours as needed for wheezing.     [provider]  albuterol (PROVENTIL HFA;VENTOLIN HFA) 108 (90 Base) MCG/ACT inhaler Inhale 1-2 puffs into the lungs every 6 (six) hours as needed for wheezing or shortness of breath.    [provider]  ALPRAZolam Duanne Moron) 0.25 MG tablet Take 0.25 mg by mouth daily.     [provider]  aspirin EC 81 MG tablet Take 81 mg by mouth daily.    [provider]  atorvastatin (LIPITOR) 20 MG tablet Take 20 mg by mouth at bedtime.     [provider]  cholecalciferol (VITAMIN D) 1000 units tablet Take 1,000 Units by mouth daily.    [provider]  cloNIDine (CATAPRES)  0.1 MG tablet Take 1 tablet (0.1 mg total) by mouth 2 (two) times daily as needed (Take as needed for systolic blood pressure (top number) over 170.  Hold if diastolic (bottom number) is below 70.). Patient not taking: Reported on 03/24/2018 11/24/17 11/24/18  Schaevitz, Randall An, MD  diltiazem (DILACOR XR) 120 MG 24 hr capsule Take  120 mg by mouth daily.    [provider]  esomeprazole (NEXIUM) 40 MG capsule Take 40 mg by mouth 2 (two) times daily before a meal.     [provider]  ferrous sulfate 325 (65 FE) MG tablet Take 325 mg by mouth daily with breakfast.    [provider]  FLUoxetine (PROZAC) 10 MG tablet Take 10 mg by mouth daily.    [provider]  fluticasone (FLONASE) 50 MCG/ACT nasal spray Place 2 sprays into both nostrils daily.     [provider]  Fluticasone-Salmeterol (ADVAIR) 250-50 MCG/DOSE AEPB Inhale 1 puff into the lungs 2 (two) times daily.    [provider]  hydrALAZINE (APRESOLINE) 25 MG tablet Take 1 tablet (25 mg total) by mouth every 6 (six) hours. 12/22/16   Nicholes Mango, MD  HYDROcodone-acetaminophen (NORCO/VICODIN) 5-325 MG tablet Take 1 tablet by mouth 2 (two) times daily.     [provider]  levothyroxine (SYNTHROID, LEVOTHROID) 75 MCG tablet Take 1 tablet (75 mcg total) by mouth daily before breakfast. 12/23/16   Gouru, Illene Silver, MD  magnesium oxide (MAG-OX) 400 MG tablet Take 400 mg by mouth 2 (two) times daily.    [provider]  montelukast (SINGULAIR) 10 MG tablet Take 10 mg by mouth at bedtime.     [provider]  ondansetron (ZOFRAN) 4 MG tablet Take 1 tablet by mouth daily as needed for nausea.     [provider]  senna (SENOKOT) 8.6 MG TABS tablet Take 1 tablet by mouth daily as needed for mild constipation.    [provider]  spironolactone (ALDACTONE) 25 MG tablet Take 25 mg by mouth daily.    [provider]    Allergies Diphenhydramine hcl; Penicillins; Captopril; Enalapril maleate; Tape; Terfenadine; Augmentin [amoxicillin-pot clavulanate]; and Biaxin [clarithromycin]  Family History  Problem Relation Age of Onset  . Hypertension Mother   . Heart disease Mother   . Cancer Mother   . Stroke Father   . Hypertension Father   . Breast cancer Sister     Social  History Social History   Tobacco Use  . Smoking status: Never Smoker  . Smokeless tobacco: Never Used  Substance Use Topics  . Alcohol use: No    Alcohol/week: 0.0 standard drinks  . Drug use: No    Review of Systems  Constitutional: No fever. Eyes: No redness. ENT: No sore throat. Cardiovascular: Denies chest pain. Respiratory: Positive for shortness of breath. Gastrointestinal: No vomiting.  Genitourinary: Negative for dysuria.  Musculoskeletal: Negative for back pain. Skin: Negative for rash. Neurological: Negative for headache.   ____________________________________________   PHYSICAL EXAM:  VITAL SIGNS: ED Triage Vitals  Enc Vitals Group     BP 07/04/18 1143 (!) 212/98     Pulse Rate 07/04/18 1143 86     Resp 07/04/18 1143 20     Temp 07/04/18 1143 98.4 F (36.9 C)     Temp Source 07/04/18 1143 Oral     SpO2 07/04/18 1143 97 %     Weight 07/04/18 1144 150 lb (68 kg)     Height 07/04/18 1144  5\' 4"  (1.626 m)     Head Circumference --      Peak Flow --      Pain Score 07/04/18 1144 0     Pain Loc --      Pain Edu? --      Excl. in [place]? --     Constitutional: Alert and oriented.  Relatively well appearing and in no acute distress. Eyes: Conjunctivae are normal.  Head: Atraumatic. Nose: No congestion/rhinnorhea. Mouth/Throat: Mucous membranes are moist.   Neck: Normal range of motion.  Cardiovascular: Normal rate, regular rhythm. Grossly normal heart sounds.  Good peripheral circulation. Respiratory: Normal respiratory effort.  No retractions. Lungs CTAB. Gastrointestinal: Soft and nontender. No distention.  Genitourinary: No flank tenderness. Musculoskeletal: No lower extremity edema.  Extremities warm and well perfused.  Neurologic:  Normal speech and language.  Motor intact in all extremities.  No gross focal neurologic deficits are appreciated.  Skin:  Skin is warm and dry. No rash noted. Psychiatric: Anxious appearing.  Speech and behavior are  normal.  ____________________________________________   LABS (all labs ordered are listed, but only abnormal results are displayed)  Labs Reviewed  BASIC METABOLIC PANEL - Abnormal; Notable for the following components:      Result Value   Sodium 130 (*)    Chloride 92 (*)    Glucose, Bld 132 (*)    GFR calc non Af Amer 58 (*)    All other components within normal limits  BRAIN NATRIURETIC PEPTIDE - Abnormal; Notable for the following components:   B Natriuretic Peptide 170.0 (*)    All other components within normal limits  URINALYSIS, COMPLETE (UACMP) WITH MICROSCOPIC - Abnormal; Notable for the following components:   Color, Urine YELLOW (*)    APPearance CLEAR (*)    Protein, ur 100 (*)    Leukocytes, UA TRACE (*)    All other components within normal limits  CBC  TROPONIN I   ____________________________________________  EKG  ED ECG REPORT I, Arta Silence, the attending physician, personally viewed and interpreted this ECG.  Date: 07/04/2018 EKG Time: 1143 Rate: 91 Rhythm: Atrial fibrillation QRS Axis: normal Intervals: LAFB ST/T Wave abnormalities: normal Narrative Interpretation: no evidence of acute ischemia  ____________________________________________  RADIOLOGY  CT head: No ICH or acute stroke CXR: Possible left lower lobe opacity  ____________________________________________   PROCEDURES  Procedure(s) performed: No  Procedures  Critical Care performed: No ____________________________________________   INITIAL IMPRESSION / ASSESSMENT AND PLAN / ED COURSE  Pertinent labs & imaging results that were available during my care of the patient were reviewed by me and considered in my medical decision making (see chart for details).  84 year old female with PMH as noted above presents with generalized weakness, feeling shaky and anxious, and some mild shortness of breath since this morning.  On exam the patient's blood pressure was  initially very elevated although it improved without intervention.  Her other vital signs are normal.  The remainder of the exam is unremarkable.  She has no focal neurologic findings.  On review of the past medical records in [place], the patient has had several prior ED visits over the last month for unrelated symptoms, and was admitted for pneumonia and hypoxia last month.  Differential for this presentation is broad but includes dehydration or other elect light abnormality, recurrent pneumonia or other infectious cause, anxiety, or less likely primary CNS cause.  The patient has no focal neurologic findings, however given her description of feeling like she could  not think clearly, I will obtain a CT to rule out recent infarct or ICH,, as well as lab work-up, UA, and chest x-ray.  ----------------------------------------- 3:42 PM on 07/04/2018 -----------------------------------------  CT head shows no evidence of acute stroke or other concerning acute findings.  The patient's labs are reassuring.  Her sodium is 130 although it seems that her baseline is in the low 130s so this is not a significant change for her.  Troponin is negative.  The chest x-ray does show a left lower lobe opacity which could be atelectasis versus developing pneumonia.  The patient was admitted overnight last month for pneumonia with respiratory failure and sepsis at that time, but was discharged after 1 day and was given Levaquin as an outpatient with successful improvement in her symptoms.  At this time, there is no indication for admission.  The patient's O2 saturation is in the high 90s on room air.  Her blood pressure has improved on its own without intervention, she is afebrile, has no elevated WBC count or other concerning lab abnormalities, and no evidence of sepsis.  Given the very brief admission last month and the other factors above, I do not think that the patient requires treatment for HCAP.  I think it would be  reasonable to restart on Levaquin for 1 week.  I had an extensive discussion with the patient about the results of the work-up.  She continues to seem quite anxious and repeatedly asks me about why I think that she might have felt confused and like she could not think straight before.  She definitely appeared somewhat dehydrated I think this is the likely culprit of her symptoms, in addition to her anxiety.  At this time she is fully alert, conversant, and answering questions appropriately.  At this time, the patient is stable for discharge home.  She agrees with this plan.  I will prescribe Levaquin and I recommend that she follows up with her regular doctor.  I gave her thorough return precautions and she expressed understanding.   ____________________________________________   FINAL CLINICAL IMPRESSION(S) / ED DIAGNOSES  Final diagnoses:  Generalized weakness  Pneumonia of left lower lobe due to infectious organism [facility])      NEW MEDICATIONS STARTED DURING THIS VISIT:  New Prescriptions   No medications on file     Note:  This document was prepared using Dragon voice recognition software and may include unintentional dictation errors.    Arta Silence, MD 07/04/18 1547

## 2018-07-04 NOTE — ED Notes (Signed)
Pt assisted to toilet to void 

## 2018-08-08 ENCOUNTER — Emergency Department: Payer: Medicare Other

## 2018-08-08 ENCOUNTER — Emergency Department
Admission: EM | Admit: 2018-08-08 | Discharge: 2018-08-08 | Disposition: A | Discharge status: Home / Self Care | Payer: Medicare Other | Attending: Emergency Medicine | Admitting: Emergency Medicine

## 2018-08-08 ENCOUNTER — Encounter: Payer: Self-pay | Admitting: Emergency Medicine

## 2018-08-08 DIAGNOSIS — Z9981 Dependence on supplemental oxygen: Secondary | ICD-10-CM | POA: Diagnosis not present

## 2018-08-08 DIAGNOSIS — Z79899 Other long term (current) drug therapy: Secondary | ICD-10-CM | POA: Diagnosis not present

## 2018-08-08 DIAGNOSIS — J449 Chronic obstructive pulmonary disease, unspecified: Secondary | ICD-10-CM | POA: Diagnosis not present

## 2018-08-08 DIAGNOSIS — Z7982 Long term (current) use of aspirin: Secondary | ICD-10-CM | POA: Diagnosis not present

## 2018-08-08 DIAGNOSIS — I10 Essential (primary) hypertension: Secondary | ICD-10-CM | POA: Diagnosis present

## 2018-08-08 DIAGNOSIS — Z853 Personal history of malignant neoplasm of breast: Secondary | ICD-10-CM | POA: Diagnosis not present

## 2018-08-08 LAB — URINALYSIS, COMPLETE (UACMP) WITH MICROSCOPIC
BILIRUBIN URINE: NEGATIVE
Bacteria, UA: NONE SEEN
Glucose, UA: NEGATIVE mg/dL
HGB URINE DIPSTICK: NEGATIVE
KETONES UR: NEGATIVE mg/dL
LEUKOCYTES UA: NEGATIVE
NITRITE: NEGATIVE
PROTEIN: NEGATIVE mg/dL
SPECIFIC GRAVITY, URINE: 1.002 — AB (ref 1.005–1.030)
pH: 8 (ref 5.0–8.0)

## 2018-08-08 LAB — CBC
HCT: 41.2 % (ref 36.0–46.0)
HEMOGLOBIN: 14 g/dL (ref 12.0–15.0)
MCH: 30.1 pg (ref 26.0–34.0)
MCHC: 34 g/dL (ref 30.0–36.0)
MCV: 88.6 fL (ref 80.0–100.0)
Platelets: 202 10*3/uL (ref 150–400)
RBC: 4.65 MIL/uL (ref 3.87–5.11)
RDW: 14.2 % (ref 11.5–15.5)
WBC: 7.9 10*3/uL (ref 4.0–10.5)
nRBC: 0 % (ref 0.0–0.2)

## 2018-08-08 LAB — BASIC METABOLIC PANEL
Anion gap: 12 (ref 5–15)
BUN: 10 mg/dL (ref 8–23)
CO2: 27 mmol/L (ref 22–32)
Calcium: 9.6 mg/dL (ref 8.9–10.3)
Chloride: 89 mmol/L — ABNORMAL LOW (ref 98–111)
Creatinine, Ser: 0.8 mg/dL (ref 0.44–1.00)
GFR calc Af Amer: >60 mL/min (ref >60)
Glucose, Bld: 121 mg/dL — ABNORMAL HIGH (ref 70–99)
POTASSIUM: 3.9 mmol/L (ref 3.5–5.1)
Sodium: 128 mmol/L — ABNORMAL LOW (ref 135–145)

## 2018-08-08 LAB — TROPONIN I

## 2018-08-08 MED ORDER — DIAZEPAM 2 MG PO TABS
2.0000 mg | ORAL_TABLET | Freq: Once | ORAL | Status: AC
  Administered 2018-08-08: 2 mg via ORAL
  Filled 2018-08-08: qty 1

## 2018-08-08 MED ORDER — HYDRALAZINE HCL 50 MG PO TABS
50.0000 mg | ORAL_TABLET | Freq: Once | ORAL | Status: AC
  Administered 2018-08-08: 50 mg via ORAL
  Filled 2018-08-08 (×2): qty 1

## 2018-08-08 NOTE — ED Notes (Signed)
Pt given urine cup, unable to void at this time.  

## 2018-08-08 NOTE — ED Notes (Signed)
Pt c/o feeling anxious and requesting a valium to help calm her down. Pt states she thinks she is having a panic attack. Dr. Corky Downs informed.

## 2018-08-08 NOTE — ED Provider Notes (Signed)
[facility] Emergency Department Provider Note   ____________________________________________    I have reviewed the triage vital signs and the nursing notes.   HISTORY  Chief Complaint Hypertension     HPI Janice Perkins is a 84 y.o. female presents with complaints of high blood pressure.  Patient has a history of hypertension.  She reports compliance with her antihypertensives.  She reports she checked her blood pressure today and found it to be elevated.  She went to her PCPs office who referred her to the emergency department.  She is essentially asymptomatic, no headache, no neuro deficits, no chest pain, no shortness of breath, no swelling.   Past Medical History:  Diagnosis Date  . Asthma   . Atrial fibrillation ([place])   . Breast cancer ([place]) 2011  . Bronchitis 04/2015  . Cancer [place]) 2011   breast  . COPD (chronic obstructive pulmonary disease) ([place])   . Hypertension   . Shingles    10/2015    Patient Active Problem List   Diagnosis Date Noted  . Hypoxia 05/30/2018  . Leukocytosis 04/05/2018  . Pneumonia 11/15/2017  . Pleural effusion 05/16/2017  . AF (paroxysmal atrial fibrillation) ([place]) 05/16/2017  . Breast cancer ([place]) 05/16/2017  . Dependence on nocturnal oxygen therapy 05/16/2017  . HTN (hypertension) 05/16/2017  . Generalized weakness 03/07/2017  . Hyponatremia 03/07/2017  . Dehydration 03/07/2017  . UTI (urinary tract infection) 03/07/2017  . HCAP (healthcare-associated pneumonia) 01/19/2017  . AKI (acute kidney injury) ([place]) 12/18/2016  . Primary cancer of upper outer quadrant of right female breast ([place]) 04/07/2016  . Lumbar radiculopathy 03/23/2016  . Spinal stenosis, lumbar region, with neurogenic claudication 03/23/2016  . DDD (degenerative disc disease), lumbar 01/14/2015  . Lumbosacral facet joint syndrome 01/14/2015  . Sacroiliac joint dysfunction 01/14/2015  . Greater trochanteric bursitis 01/14/2015  .  Bilateral occipital neuralgia 01/14/2015    Past Surgical History:  Procedure Laterality Date  . BREAST EXCISIONAL BIOPSY Right 11/29/13   two areas FAT NECROSIS  . BREAST EXCISIONAL BIOPSY Right 10/30/2009   lumpectomy - radiation  . COLONOSCOPY WITH PROPOFOL N/A 06/10/2018   Procedure: COLONOSCOPY WITH PROPOFOL;  Surgeon: Manya Silvas, MD;  Location: [facility] ENDOSCOPY;  Service: Endoscopy;  Laterality: N/A;  . ESOPHAGOGASTRODUODENOSCOPY (EGD) WITH PROPOFOL N/A 06/10/2018   Procedure: ESOPHAGOGASTRODUODENOSCOPY (EGD) WITH PROPOFOL;  Surgeon: Manya Silvas, MD;  Location: [facility] ENDOSCOPY;  Service: Endoscopy;  Laterality: N/A;  . NASAL SINUS SURGERY      Prior to Admission medications   Medication Sig Start Date End Date Taking? Authorizing Provider  albuterol (ACCUNEB) 1.25 MG/3ML nebulizer solution Inhale 3 mLs into the lungs every 6 (six) hours as needed for wheezing.     [provider]  albuterol (PROVENTIL HFA;VENTOLIN HFA) 108 (90 Base) MCG/ACT inhaler Inhale 1-2 puffs into the lungs every 6 (six) hours as needed for wheezing or shortness of breath.    [provider]  ALPRAZolam Duanne Moron) 0.25 MG tablet Take 0.25 mg by mouth daily.     [provider]  aspirin EC 81 MG tablet Take 81 mg by mouth daily.    [provider]  atorvastatin (LIPITOR) 20 MG tablet Take 20 mg by mouth at bedtime.     [provider]  cholecalciferol (VITAMIN D) 1000 units tablet Take 1,000 Units by mouth daily.    [provider]  cloNIDine (CATAPRES) 0.1 MG tablet Take 1 tablet (0.1 mg total) by mouth 2 (two) times  daily as needed (Take as needed for systolic blood pressure (top number) over 170.  Hold if diastolic (bottom number) is below 70.). Patient not taking: Reported on 03/24/2018 11/24/17 11/24/18  Schaevitz, Randall An, MD  diltiazem (DILACOR XR) 120 MG 24 hr capsule Take 120 mg by mouth daily.    [provider]  esomeprazole (NEXIUM)  40 MG capsule Take 40 mg by mouth 2 (two) times daily before a meal.     [provider]  ferrous sulfate 325 (65 FE) MG tablet Take 325 mg by mouth daily with breakfast.    [provider]  FLUoxetine (PROZAC) 10 MG tablet Take 10 mg by mouth daily.    [provider]  fluticasone (FLONASE) 50 MCG/ACT nasal spray Place 2 sprays into both nostrils daily.     [provider]  Fluticasone-Salmeterol (ADVAIR) 250-50 MCG/DOSE AEPB Inhale 1 puff into the lungs 2 (two) times daily.    [provider]  hydrALAZINE (APRESOLINE) 25 MG tablet Take 1 tablet (25 mg total) by mouth every 6 (six) hours. 12/22/16   Nicholes Mango, MD  HYDROcodone-acetaminophen (NORCO/VICODIN) 5-325 MG tablet Take 1 tablet by mouth 2 (two) times daily.     [provider]  levothyroxine (SYNTHROID, LEVOTHROID) 75 MCG tablet Take 1 tablet (75 mcg total) by mouth daily before breakfast. 12/23/16   Gouru, Illene Silver, MD  magnesium oxide (MAG-OX) 400 MG tablet Take 400 mg by mouth 2 (two) times daily.    [provider]  montelukast (SINGULAIR) 10 MG tablet Take 10 mg by mouth at bedtime.     [provider]  ondansetron (ZOFRAN) 4 MG tablet Take 1 tablet by mouth daily as needed for nausea.     [provider]  senna (SENOKOT) 8.6 MG TABS tablet Take 1 tablet by mouth daily as needed for mild constipation.    [provider]  spironolactone (ALDACTONE) 25 MG tablet Take 25 mg by mouth daily.    [provider]     Allergies Diphenhydramine hcl; Penicillins; Captopril; Enalapril maleate; Tape; Terfenadine; Augmentin [amoxicillin-pot clavulanate]; and Biaxin [clarithromycin]  Family History  Problem Relation Age of Onset  . Hypertension Mother   . Heart disease Mother   . Cancer Mother   . Stroke Father   . Hypertension Father   . Breast cancer Sister     Social History Social History   Tobacco Use  . Smoking status: Never Smoker  .  Smokeless tobacco: Never Used  Substance Use Topics  . Alcohol use: No    Alcohol/week: 0.0 standard drinks  . Drug use: No    Review of Systems  Constitutional: No dizziness Eyes: No visual changes.  ENT: No s neck pain Cardiovascular: Denies chest pain. Respiratory: Denies shortness of breath.    Genitourinary: Frequency Musculoskeletal: Chronic back pain Neurological: Negative for headaches, no deficits   ____________________________________________   PHYSICAL EXAM:  VITAL SIGNS: ED Triage Vitals  Enc Vitals Group     BP 08/08/18 1320 (!) 208/102     Pulse Rate 08/08/18 1320 89     Resp 08/08/18 1320 19     Temp 08/08/18 1320 (!) 97.5 F (36.4 C)     Temp Source 08/08/18 1320 Oral     SpO2 08/08/18 1320 98 %     Weight 08/08/18 1321 70.3 kg (155 lb)     Height 08/08/18 1321 1.524 m (5')     Head Circumference --      Peak Flow --  Pain Score 08/08/18 1324 0     Pain Loc --      Pain Edu? --      Excl. in [place]? --     Constitutional: Alert and oriented. No acute distress. Pleasant and interactive Eyes: Conjunctivae are normal.   Nose: No congestion/rhinnorhea. Mouth/Throat: Mucous membranes are moist.    Cardiovascular: Normal rate, regular rhythm. Grossly normal heart sounds.  Good peripheral circulation. Respiratory: Normal respiratory effort.  No retractions. Lungs CTAB. Gastrointestinal: Soft and nontender. No distention.  No CVA tenderness.  Musculoskeletal:  Warm and well perfused Neurologic:  Normal speech and language. No gross focal neurologic deficits are appreciated.  Skin:  Skin is warm, dry and intact. No rash noted. Psychiatric: Mood and affect are normal. Speech and behavior are normal.  ____________________________________________   LABS (all labs ordered are listed, but only abnormal results are displayed)  Labs Reviewed  BASIC METABOLIC PANEL - Abnormal; Notable for the following components:      Result Value   Sodium 128 (*)      Chloride 89 (*)    Glucose, Bld 121 (*)    All other components within normal limits  URINALYSIS, COMPLETE (UACMP) WITH MICROSCOPIC - Abnormal; Notable for the following components:   Color, Urine COLORLESS (*)    APPearance CLEAR (*)    Specific Gravity, Urine 1.002 (*)    All other components within normal limits  CBC  TROPONIN I   ____________________________________________  EKG  ED ECG REPORT I, Lavonia Drafts, the attending physician, personally viewed and interpreted this ECG.  Date: 08/08/2018  Rhythm: Atrial fibrillation QRS Axis: normal Intervals abnormal ST/T Wave abnormalities: normal Narrative Interpretation: no evidence of acute ischemia  ____________________________________________  RADIOLOGY  Chest x-ray no acute distress ____________________________________________   PROCEDURES  Procedure(s) performed: No  Procedures   Critical Care performed:No ____________________________________________   INITIAL IMPRESSION / ASSESSMENT AND PLAN / ED COURSE  Pertinent labs & imaging results that were available during my care of the patient were reviewed by me and considered in my medical decision making (see chart for details).  Patient presents with concerns of high blood pressure, her blood pressure is elevated with systolics in the low 841L however she is asymptomatic.  We will give her her home medications and reevaluate   ----------------------------------------- 5:10 PM on 08/08/2018 -----------------------------------------  Patient's blood pressure has improved significantly, she is requesting a Valium for her anxiety.  Pending urinalysis for her frequency  Urinalysis unremarkable    ____________________________________________   FINAL CLINICAL IMPRESSION(S) / ED DIAGNOSES  Final diagnoses:  Essential hypertension        Note:  This document was prepared using Dragon voice recognition software and may include unintentional  dictation errors.    Lavonia Drafts, MD 08/08/18 [id number]

## 2018-08-08 NOTE — ED Notes (Signed)
ED Provider at bedside. 

## 2018-08-08 NOTE — ED Triage Notes (Signed)
Pt reports her BP was really high this am and she took medication but it has remained high. Pt denies CP, states she feels a little anxious and has a hx of a-fib.  Pt also reports received a shot in her arm last week and now is it swollen some and she doesn't know why.

## 2018-08-08 NOTE — ED Notes (Signed)
Pt reports elevated BP, takes BP meds at home, due for hydralazine now.  Pt states she took Clonidine while she was at the doctor's office around 12pm

## 2018-08-11 ENCOUNTER — Ambulatory Visit
Admission: RE | Admit: 2018-08-11 | Discharge: 2018-08-11 | Disposition: A | Discharge status: Home / Self Care | Payer: Medicare Other | Source: Ambulatory Visit | Attending: Physician Assistant | Admitting: Physician Assistant

## 2018-08-11 ENCOUNTER — Other Ambulatory Visit: Payer: Self-pay | Admitting: Physician Assistant

## 2018-08-11 DIAGNOSIS — M7989 Other specified soft tissue disorders: Secondary | ICD-10-CM

## 2018-09-02 ENCOUNTER — Encounter: Payer: Self-pay | Admitting: Emergency Medicine

## 2018-09-02 ENCOUNTER — Emergency Department: Payer: Medicare Other

## 2018-09-02 ENCOUNTER — Inpatient Hospital Stay
Admission: EM | Admit: 2018-09-02 | Discharge: 2018-09-08 | DRG: 644 | Disposition: A | Discharge status: Home Health | Payer: Medicare Other | Attending: Internal Medicine | Admitting: Internal Medicine

## 2018-09-02 ENCOUNTER — Other Ambulatory Visit: Payer: Self-pay

## 2018-09-02 DIAGNOSIS — E039 Hypothyroidism, unspecified: Secondary | ICD-10-CM | POA: Diagnosis present

## 2018-09-02 DIAGNOSIS — I10 Essential (primary) hypertension: Secondary | ICD-10-CM | POA: Diagnosis present

## 2018-09-02 DIAGNOSIS — Z880 Allergy status to penicillin: Secondary | ICD-10-CM

## 2018-09-02 DIAGNOSIS — Z9989 Dependence on other enabling machines and devices: Secondary | ICD-10-CM | POA: Diagnosis not present

## 2018-09-02 DIAGNOSIS — Z888 Allergy status to other drugs, medicaments and biological substances status: Secondary | ICD-10-CM

## 2018-09-02 DIAGNOSIS — Z8249 Family history of ischemic heart disease and other diseases of the circulatory system: Secondary | ICD-10-CM | POA: Diagnosis not present

## 2018-09-02 DIAGNOSIS — F05 Delirium due to known physiological condition: Secondary | ICD-10-CM | POA: Diagnosis not present

## 2018-09-02 DIAGNOSIS — Z803 Family history of malignant neoplasm of breast: Secondary | ICD-10-CM | POA: Diagnosis not present

## 2018-09-02 DIAGNOSIS — Z9981 Dependence on supplemental oxygen: Secondary | ICD-10-CM

## 2018-09-02 DIAGNOSIS — Z791 Long term (current) use of non-steroidal anti-inflammatories (NSAID): Secondary | ICD-10-CM

## 2018-09-02 DIAGNOSIS — E876 Hypokalemia: Secondary | ICD-10-CM | POA: Diagnosis present

## 2018-09-02 DIAGNOSIS — Z79891 Long term (current) use of opiate analgesic: Secondary | ICD-10-CM

## 2018-09-02 DIAGNOSIS — M5116 Intervertebral disc disorders with radiculopathy, lumbar region: Secondary | ICD-10-CM | POA: Diagnosis present

## 2018-09-02 DIAGNOSIS — Z7902 Long term (current) use of antithrombotics/antiplatelets: Secondary | ICD-10-CM

## 2018-09-02 DIAGNOSIS — E871 Hypo-osmolality and hyponatremia: Secondary | ICD-10-CM

## 2018-09-02 DIAGNOSIS — Z9181 History of falling: Secondary | ICD-10-CM | POA: Diagnosis not present

## 2018-09-02 DIAGNOSIS — Z79890 Hormone replacement therapy (postmenopausal): Secondary | ICD-10-CM | POA: Diagnosis not present

## 2018-09-02 DIAGNOSIS — Z853 Personal history of malignant neoplasm of breast: Secondary | ICD-10-CM

## 2018-09-02 DIAGNOSIS — R296 Repeated falls: Secondary | ICD-10-CM | POA: Diagnosis present

## 2018-09-02 DIAGNOSIS — I480 Paroxysmal atrial fibrillation: Secondary | ICD-10-CM | POA: Diagnosis present

## 2018-09-02 DIAGNOSIS — F329 Major depressive disorder, single episode, unspecified: Secondary | ICD-10-CM | POA: Diagnosis present

## 2018-09-02 DIAGNOSIS — R9431 Abnormal electrocardiogram [ECG] [EKG]: Secondary | ICD-10-CM | POA: Diagnosis not present

## 2018-09-02 DIAGNOSIS — M48062 Spinal stenosis, lumbar region with neurogenic claudication: Secondary | ICD-10-CM | POA: Diagnosis present

## 2018-09-02 DIAGNOSIS — J449 Chronic obstructive pulmonary disease, unspecified: Secondary | ICD-10-CM | POA: Diagnosis present

## 2018-09-02 DIAGNOSIS — Z79899 Other long term (current) drug therapy: Secondary | ICD-10-CM

## 2018-09-02 DIAGNOSIS — Z7951 Long term (current) use of inhaled steroids: Secondary | ICD-10-CM

## 2018-09-02 DIAGNOSIS — F419 Anxiety disorder, unspecified: Secondary | ICD-10-CM | POA: Diagnosis present

## 2018-09-02 DIAGNOSIS — E222 Syndrome of inappropriate secretion of antidiuretic hormone: Secondary | ICD-10-CM | POA: Diagnosis not present

## 2018-09-02 DIAGNOSIS — R531 Weakness: Secondary | ICD-10-CM | POA: Diagnosis not present

## 2018-09-02 DIAGNOSIS — Z7982 Long term (current) use of aspirin: Secondary | ICD-10-CM | POA: Diagnosis not present

## 2018-09-02 DIAGNOSIS — Z8619 Personal history of other infectious and parasitic diseases: Secondary | ICD-10-CM | POA: Diagnosis not present

## 2018-09-02 DIAGNOSIS — Z823 Family history of stroke: Secondary | ICD-10-CM

## 2018-09-02 DIAGNOSIS — Z91048 Other nonmedicinal substance allergy status: Secondary | ICD-10-CM

## 2018-09-02 DIAGNOSIS — Z881 Allergy status to other antibiotic agents status: Secondary | ICD-10-CM

## 2018-09-02 LAB — CBC
HCT: 37.6 % (ref 36.0–46.0)
HCT: 38.6 % (ref 36.0–46.0)
Hemoglobin: 14.1 g/dL (ref 12.0–15.0)
Hemoglobin: 14.1 g/dL (ref 12.0–15.0)
MCH: 30.5 pg (ref 26.0–34.0)
MCH: 30.2 pg (ref 26.0–34.0)
MCHC: 37.5 g/dL — ABNORMAL HIGH (ref 30.0–36.0)
MCHC: 36.5 g/dL — ABNORMAL HIGH (ref 30.0–36.0)
MCV: 81.4 fL (ref 80.0–100.0)
MCV: 82.7 fL (ref 80.0–100.0)
PLATELETS: 262 10*3/uL (ref 150–400)
Platelets: 268 10*3/uL (ref 150–400)
RBC: 4.62 MIL/uL (ref 3.87–5.11)
RBC: 4.67 MIL/uL (ref 3.87–5.11)
RDW: 13.2 % (ref 11.5–15.5)
RDW: 13.3 % (ref 11.5–15.5)
WBC: 7.2 10*3/uL (ref 4.0–10.5)
WBC: 7 10*3/uL (ref 4.0–10.5)
nRBC: 0 % (ref 0.0–0.2)
nRBC: 0 % (ref 0.0–0.2)

## 2018-09-02 LAB — DIFFERENTIAL
Abs Immature Granulocytes: 0.04 10*3/uL (ref 0.00–0.07)
BASOS PCT: 0 %
Basophils Absolute: 0 10*3/uL (ref 0.0–0.1)
Eosinophils Absolute: 0.1 10*3/uL (ref 0.0–0.5)
Eosinophils Relative: 1 %
Immature Granulocytes: 1 %
Lymphocytes Relative: 22 %
Lymphs Abs: 1.6 10*3/uL (ref 0.7–4.0)
Monocytes Absolute: 0.9 10*3/uL (ref 0.1–1.0)
Monocytes Relative: 12 %
NEUTROS PCT: 64 %
Neutro Abs: 4.7 10*3/uL (ref 1.7–7.7)

## 2018-09-02 LAB — CREATININE, SERUM
Creatinine, Ser: 0.67 mg/dL (ref 0.44–1.00)
GFR calc Af Amer: >60 mL/min (ref >60)
GFR calc non Af Amer: >60 mL/min (ref >60)

## 2018-09-02 LAB — COMPREHENSIVE METABOLIC PANEL
ALT: 29 U/L (ref 0–44)
ANION GAP: 14 (ref 5–15)
AST: 44 U/L — ABNORMAL HIGH (ref 15–41)
Albumin: 4.2 g/dL (ref 3.5–5.0)
Alkaline Phosphatase: 60 U/L (ref 38–126)
BUN: 10 mg/dL (ref 8–23)
CO2: 23 mmol/L (ref 22–32)
Calcium: 9.2 mg/dL (ref 8.9–10.3)
Chloride: 74 mmol/L — ABNORMAL LOW (ref 98–111)
Creatinine, Ser: 0.78 mg/dL (ref 0.44–1.00)
GFR calc non Af Amer: >60 mL/min (ref >60)
Glucose, Bld: 121 mg/dL — ABNORMAL HIGH (ref 70–99)
Potassium: 3.1 mmol/L — ABNORMAL LOW (ref 3.5–5.1)
Sodium: 111 mmol/L — CL (ref 135–145)
Total Bilirubin: 1.3 mg/dL — ABNORMAL HIGH (ref 0.3–1.2)
Total Protein: 7.5 g/dL (ref 6.5–8.1)

## 2018-09-02 LAB — TROPONIN I: Troponin I: <0.03 ng/mL (ref <0.03)

## 2018-09-02 LAB — GLUCOSE, CAPILLARY: Glucose-Capillary: 140 mg/dL — ABNORMAL HIGH (ref 70–99)

## 2018-09-02 LAB — URINE DRUG SCREEN, QUALITATIVE (ARMC ONLY)
AMPHETAMINES, UR SCREEN: NOT DETECTED
Barbiturates, Ur Screen: NOT DETECTED
Benzodiazepine, Ur Scrn: POSITIVE — AB
Cannabinoid 50 Ng, Ur ~~LOC~~: NOT DETECTED
Cocaine Metabolite,Ur ~~LOC~~: NOT DETECTED
MDMA (Ecstasy)Ur Screen: NOT DETECTED
METHADONE SCREEN, URINE: NOT DETECTED
Opiate, Ur Screen: POSITIVE — AB
Phencyclidine (PCP) Ur S: NOT DETECTED
TRICYCLIC, UR SCREEN: NOT DETECTED

## 2018-09-02 LAB — SODIUM
SODIUM: 111 mmol/L — AB (ref 135–145)
Sodium: 110 mmol/L — CL (ref 135–145)
Sodium: 112 mmol/L — CL (ref 135–145)
Sodium: 110 mmol/L — CL (ref 135–145)

## 2018-09-02 LAB — SODIUM, URINE, RANDOM: Sodium, Ur: 74 mmol/L

## 2018-09-02 LAB — URINALYSIS, ROUTINE W REFLEX MICROSCOPIC
Bacteria, UA: NONE SEEN
Bilirubin Urine: NEGATIVE
Glucose, UA: NEGATIVE mg/dL
Hgb urine dipstick: NEGATIVE
Ketones, ur: 5 mg/dL — AB
Leukocytes, UA: NEGATIVE
Nitrite: NEGATIVE
Protein, ur: 100 mg/dL — AB
Specific Gravity, Urine: 1.006 (ref 1.005–1.030)
Squamous Epithelial / HPF: NONE SEEN (ref 0–5)
pH: 8 (ref 5.0–8.0)

## 2018-09-02 LAB — PROTIME-INR
INR: 0.88
Prothrombin Time: 11.9 seconds (ref 11.4–15.2)

## 2018-09-02 LAB — POTASSIUM: Potassium: 3.6 mmol/L (ref 3.5–5.1)

## 2018-09-02 LAB — MRSA PCR SCREENING: MRSA BY PCR: NEGATIVE

## 2018-09-02 LAB — APTT: aPTT: 33 seconds (ref 24–36)

## 2018-09-02 LAB — OSMOLALITY, URINE: Osmolality, Ur: 246 mOsm/kg — ABNORMAL LOW (ref 300–900)

## 2018-09-02 LAB — ETHANOL: Alcohol, Ethyl (B): <10 mg/dL (ref <10)

## 2018-09-02 MED ORDER — ALPRAZOLAM 0.25 MG PO TABS
0.2500 mg | ORAL_TABLET | Freq: Two times a day (BID) | ORAL | Status: DC | PRN
  Administered 2018-09-02 – 2018-09-08 (×10): 0.25 mg via ORAL
  Filled 2018-09-02 (×10): qty 1

## 2018-09-02 MED ORDER — ENOXAPARIN SODIUM 40 MG/0.4ML ~~LOC~~ SOLN
40.0000 mg | SUBCUTANEOUS | Status: DC
  Administered 2018-09-02 – 2018-09-03 (×2): 40 mg via SUBCUTANEOUS
  Filled 2018-09-02 (×2): qty 0.4

## 2018-09-02 MED ORDER — ALPRAZOLAM 0.5 MG PO TABS
0.5000 mg | ORAL_TABLET | Freq: Once | ORAL | Status: AC
  Administered 2018-09-02: 0.5 mg via ORAL
  Filled 2018-09-02: qty 1

## 2018-09-02 MED ORDER — PANTOPRAZOLE SODIUM 40 MG PO TBEC
40.0000 mg | DELAYED_RELEASE_TABLET | Freq: Every day | ORAL | Status: DC
  Administered 2018-09-02 – 2018-09-08 (×7): 40 mg via ORAL
  Filled 2018-09-02 (×7): qty 1

## 2018-09-02 MED ORDER — ACETAMINOPHEN 325 MG PO TABS
650.0000 mg | ORAL_TABLET | Freq: Four times a day (QID) | ORAL | Status: DC | PRN
  Administered 2018-09-02 – 2018-09-08 (×6): 650 mg via ORAL
  Filled 2018-09-02 (×6): qty 2

## 2018-09-02 MED ORDER — LABETALOL HCL 5 MG/ML IV SOLN
10.0000 mg | Freq: Once | INTRAVENOUS | Status: AC
  Administered 2018-09-02: 10 mg via INTRAVENOUS
  Filled 2018-09-02: qty 4

## 2018-09-02 MED ORDER — ALBUTEROL SULFATE (2.5 MG/3ML) 0.083% IN NEBU: 2.5000 mg | INHALATION_SOLUTION | Freq: Four times a day (QID) | RESPIRATORY_TRACT | Status: DC | PRN

## 2018-09-02 MED ORDER — POTASSIUM CHLORIDE 20 MEQ PO PACK
20.0000 meq | PACK | Freq: Once | ORAL | Status: AC
  Administered 2018-09-03: 20 meq via ORAL
  Filled 2018-09-02: qty 1

## 2018-09-02 MED ORDER — POTASSIUM CHLORIDE 10 MEQ/100ML IV SOLN
10.0000 meq | INTRAVENOUS | Status: AC
  Administered 2018-09-02: 10 meq via INTRAVENOUS
  Filled 2018-09-02 (×3): qty 100

## 2018-09-02 MED ORDER — MAGNESIUM OXIDE 400 (241.3 MG) MG PO TABS
400.0000 mg | ORAL_TABLET | Freq: Two times a day (BID) | ORAL | Status: DC
  Administered 2018-09-02 – 2018-09-08 (×13): 400 mg via ORAL
  Filled 2018-09-02 (×13): qty 1

## 2018-09-02 MED ORDER — LORATADINE 10 MG PO TABS
10.0000 mg | ORAL_TABLET | Freq: Every day | ORAL | Status: DC
  Administered 2018-09-02 – 2018-09-08 (×7): 10 mg via ORAL
  Filled 2018-09-02 (×7): qty 1

## 2018-09-02 MED ORDER — ACETAMINOPHEN 650 MG RE SUPP: 650.0000 mg | Freq: Four times a day (QID) | RECTAL | Status: DC | PRN

## 2018-09-02 MED ORDER — SODIUM CHLORIDE 0.9 % IV BOLUS
1000.0000 mL | Freq: Once | INTRAVENOUS | Status: AC
  Administered 2018-09-02: 1000 mL via INTRAVENOUS

## 2018-09-02 MED ORDER — HYDROCODONE-ACETAMINOPHEN 5-325 MG PO TABS
1.0000 | ORAL_TABLET | Freq: Two times a day (BID) | ORAL | Status: DC | PRN
  Administered 2018-09-02 – 2018-09-07 (×7): 1 via ORAL
  Filled 2018-09-02 (×7): qty 1

## 2018-09-02 MED ORDER — LEVOTHYROXINE SODIUM 50 MCG PO TABS
75.0000 ug | ORAL_TABLET | Freq: Every day | ORAL | Status: DC
  Administered 2018-09-03 – 2018-09-08 (×6): 75 ug via ORAL
  Filled 2018-09-02: qty 2
  Filled 2018-09-02 (×2): qty 1
  Filled 2018-09-02: qty 2
  Filled 2018-09-02 (×2): qty 1

## 2018-09-02 MED ORDER — MECLIZINE HCL 25 MG PO TABS
25.0000 mg | ORAL_TABLET | Freq: Once | ORAL | Status: AC
  Administered 2018-09-02: 25 mg via ORAL
  Filled 2018-09-02: qty 1

## 2018-09-02 MED ORDER — POTASSIUM CHLORIDE CRYS ER 20 MEQ PO TBCR
40.0000 meq | EXTENDED_RELEASE_TABLET | Freq: Once | ORAL | Status: AC
  Administered 2018-09-02: 40 meq via ORAL
  Filled 2018-09-02: qty 2

## 2018-09-02 MED ORDER — ATORVASTATIN CALCIUM 20 MG PO TABS
20.0000 mg | ORAL_TABLET | Freq: Every day | ORAL | Status: DC
  Administered 2018-09-02 – 2018-09-07 (×6): 20 mg via ORAL
  Filled 2018-09-02 (×6): qty 1

## 2018-09-02 MED ORDER — SODIUM CHLORIDE 0.9% FLUSH
3.0000 mL | Freq: Two times a day (BID) | INTRAVENOUS | Status: DC
  Administered 2018-09-02 – 2018-09-08 (×13): 3 mL via INTRAVENOUS
  Filled 2018-09-02: qty 3

## 2018-09-02 MED ORDER — DIAZEPAM 2 MG PO TABS: 5.0000 mg | ORAL_TABLET | Freq: Every day | ORAL | Status: DC | PRN

## 2018-09-02 MED ORDER — FLUOXETINE HCL 20 MG PO CAPS
40.0000 mg | ORAL_CAPSULE | Freq: Every day | ORAL | Status: DC
  Administered 2018-09-02 – 2018-09-07 (×6): 40 mg via ORAL
  Filled 2018-09-02 (×6): qty 2

## 2018-09-02 MED ORDER — CLONIDINE HCL 0.1 MG PO TABS
0.1000 mg | ORAL_TABLET | Freq: Two times a day (BID) | ORAL | Status: DC | PRN
  Administered 2018-09-04 – 2018-09-06 (×4): 0.1 mg via ORAL
  Filled 2018-09-02 (×4): qty 1

## 2018-09-02 MED ORDER — CLOPIDOGREL BISULFATE 75 MG PO TABS
75.0000 mg | ORAL_TABLET | Freq: Every day | ORAL | Status: DC
  Administered 2018-09-02 – 2018-09-08 (×7): 75 mg via ORAL
  Filled 2018-09-02 (×7): qty 1

## 2018-09-02 MED ORDER — BUSPIRONE HCL 10 MG PO TABS
10.0000 mg | ORAL_TABLET | Freq: Two times a day (BID) | ORAL | Status: DC
  Administered 2018-09-02 – 2018-09-08 (×13): 10 mg via ORAL
  Filled 2018-09-02 (×15): qty 1

## 2018-09-02 MED ORDER — SODIUM CHLORIDE 0.9 % IV SOLN
INTRAVENOUS | Status: DC
  Administered 2018-09-02 (×2): via INTRAVENOUS

## 2018-09-02 MED ORDER — POTASSIUM CHLORIDE CRYS ER 20 MEQ PO TBCR
20.0000 meq | EXTENDED_RELEASE_TABLET | Freq: Once | ORAL | Status: AC
  Administered 2018-09-02: 20 meq via ORAL
  Filled 2018-09-02: qty 1

## 2018-09-02 MED ORDER — VITAMIN D3 25 MCG (1000 UNIT) PO TABS
1000.0000 [IU] | ORAL_TABLET | Freq: Every day | ORAL | Status: DC
  Administered 2018-09-02 – 2018-09-08 (×7): 1000 [IU] via ORAL
  Filled 2018-09-02 (×7): qty 1

## 2018-09-02 MED ORDER — ONDANSETRON HCL 4 MG PO TABS
4.0000 mg | ORAL_TABLET | Freq: Four times a day (QID) | ORAL | Status: DC | PRN
  Administered 2018-09-02 – 2018-09-04 (×3): 4 mg via ORAL
  Filled 2018-09-02 (×3): qty 1

## 2018-09-02 MED ORDER — ALBUTEROL SULFATE HFA 108 (90 BASE) MCG/ACT IN AERS: 1.0000 | INHALATION_SPRAY | Freq: Four times a day (QID) | RESPIRATORY_TRACT | Status: DC | PRN

## 2018-09-02 MED ORDER — SODIUM CHLORIDE 3 % IV SOLN
INTRAVENOUS | Status: DC
  Administered 2018-09-03: 30 mL/h via INTRAVENOUS
  Filled 2018-09-02 (×4): qty 500

## 2018-09-02 MED ORDER — MOMETASONE FURO-FORMOTEROL FUM 100-5 MCG/ACT IN AERO
2.0000 | INHALATION_SPRAY | Freq: Two times a day (BID) | RESPIRATORY_TRACT | Status: DC
  Administered 2018-09-02 – 2018-09-08 (×12): 2 via RESPIRATORY_TRACT
  Filled 2018-09-02: qty 8.8

## 2018-09-02 MED ORDER — DILTIAZEM HCL ER COATED BEADS 120 MG PO TB24
120.0000 mg | ORAL_TABLET | Freq: Every day | ORAL | Status: DC
  Administered 2018-09-02 – 2018-09-08 (×7): 120 mg via ORAL
  Filled 2018-09-02 (×11): qty 1

## 2018-09-02 MED ORDER — SENNA 8.6 MG PO TABS
1.0000 | ORAL_TABLET | Freq: Every day | ORAL | Status: DC | PRN
  Administered 2018-09-07: 8.6 mg via ORAL
  Filled 2018-09-02: qty 1

## 2018-09-02 MED ORDER — ONDANSETRON HCL 4 MG/2ML IJ SOLN
4.0000 mg | Freq: Four times a day (QID) | INTRAMUSCULAR | Status: DC | PRN
  Administered 2018-09-07: 4 mg via INTRAVENOUS
  Filled 2018-09-02: qty 2

## 2018-09-02 MED ORDER — AZELASTINE HCL 0.1 % NA SOLN
1.0000 | Freq: Two times a day (BID) | NASAL | Status: DC
  Administered 2018-09-02 – 2018-09-08 (×13): 1 via NASAL
  Filled 2018-09-02: qty 30

## 2018-09-02 MED ORDER — LABETALOL HCL 5 MG/ML IV SOLN
10.0000 mg | INTRAVENOUS | Status: DC | PRN
  Administered 2018-09-03 – 2018-09-07 (×10): 10 mg via INTRAVENOUS
  Filled 2018-09-02 (×10): qty 4

## 2018-09-02 MED ORDER — FERROUS SULFATE 325 (65 FE) MG PO TABS
325.0000 mg | ORAL_TABLET | Freq: Every day | ORAL | Status: DC
  Administered 2018-09-03 – 2018-09-08 (×6): 325 mg via ORAL
  Filled 2018-09-02 (×6): qty 1

## 2018-09-02 NOTE — H&P (Signed)
[facility] NAME: Janice Perkins    MR#:  [id number]  DATE OF BIRTH:  06/01/1933  DATE OF ADMISSION:  09/02/2018  PRIMARY CARE PHYSICIAN: [name], MD   REQUESTING/REFERRING PHYSICIAN: Orbie Pyo, MD  CHIEF COMPLAINT:  Dizziness and fall  HISTORY OF PRESENT ILLNESS:  Janice Perkins  is a 85 y.o. female with a known history of chronic atrial fibrillation, essential hypertension recently started on chlorthalidone by cardiologist, COPD, anxiety and multiple other medical problems is brought into the ED so she was complaining of dizziness  approximately for the past 10 days which has been progressively getting worse Patient sustained a fall last night and landed on her bottom.  Denies any loss of consciousness patient is brought into the emergency department, CT head is negative and sodium is found to be at 111.  Currently patient is reporting dizziness and shakes.  Received 1 L of fluid bolus.  Daughter is at bedside.  PAST MEDICAL HISTORY:   Past Medical History:  Diagnosis Date  . Asthma   . Atrial fibrillation ([place])   . Breast cancer ([place]) 2011  . Bronchitis 04/2015  . Cancer [facility]) 2011   breast  . COPD (chronic obstructive pulmonary disease) ([place])   . Hypertension   . Shingles    10/2015    PAST SURGICAL HISTOIRY:   Past Surgical History:  Procedure Laterality Date  . BREAST EXCISIONAL BIOPSY Right 11/29/13   two areas FAT NECROSIS  . BREAST EXCISIONAL BIOPSY Right 10/30/2009   lumpectomy - radiation  . COLONOSCOPY WITH PROPOFOL N/A 06/10/2018   Procedure: COLONOSCOPY WITH PROPOFOL;  Surgeon: Manya Silvas, MD;  Location[facility] ENDOSCOPY;  Service: Endoscopy;  Laterality: N/A;  . ESOPHAGOGASTRODUODENOSCOPY (EGD) WITH PROPOFOL N/A 06/10/2018   Procedure: ESOPHAGOGASTRODUODENOSCOPY (EGD) WITH PROPOFOL;  Surgeon: Manya Silvas, MD;  Location[facility] ENDOSCOPY;  Service: Endoscopy;  Laterality:  N/A;  . NASAL SINUS SURGERY      SOCIAL HISTORY:   Social History   Tobacco Use  . Smoking status: Never Smoker  . Smokeless tobacco: Never Used  Substance Use Topics  . Alcohol use: No    Alcohol/week: 0.0 standard drinks    FAMILY HISTORY:   Family History  Problem Relation Age of Onset  . Hypertension Mother   . Heart disease Mother   . Cancer Mother   . Stroke Father   . Hypertension Father   . Breast cancer Sister     DRUG ALLERGIES:   Allergies  Allergen Reactions  . Diphenhydramine Hcl Rash  . Penicillins Hives    .Has patient had a PCN reaction causing immediate rash, facial/tongue/throat swelling, SOB or lightheadedness with hypotension: Unknown Has patient had a PCN reaction causing severe rash involving mucus membranes or skin necrosis: Unknown Has patient had a PCN reaction that required hospitalization: Unknown Has patient had a PCN reaction occurring within the last 10 years: Unknown If all of the above answers are "NO", then may proceed with Cephalosporin use.   . Captopril Other (See Comments)  . Enalapril Maleate     Other reaction(s): Unknown  . Tape Itching  . Terfenadine Other (See Comments)  . Augmentin [Amoxicillin-Pot Clavulanate] Rash    Has patient had a PCN reaction causing immediate rash, facial/tongue/throat swelling, SOB or lightheadedness with hypotension: Unknown Has patient had a PCN reaction causing severe rash involving mucus membranes or skin necrosis: Unknown Has patient had a PCN reaction that required hospitalization:  Unknown Has patient had a PCN reaction occurring within the last 10 years: Unknown If all of the above answers are "NO", then may proceed with Cephalosporin use.  . Biaxin [Clarithromycin] Rash, Other (See Comments) and Hives    Other reaction(s): Unknown    REVIEW OF SYSTEMS:  CONSTITUTIONAL: No fever, fatigue or weakness.  Reporting shakes and dizziness EYES: No blurred or double vision.  EARS, NOSE, AND  THROAT: No tinnitus or ear pain.  RESPIRATORY: No cough, shortness of breath, wheezing or hemoptysis.  CARDIOVASCULAR: No chest pain, orthopnea, edema.  GASTROINTESTINAL: No nausea, vomiting, diarrhea or abdominal pain.  GENITOURINARY: No dysuria, hematuria.  ENDOCRINE: No polyuria, nocturia,  HEMATOLOGY: No anemia, easy bruising or bleeding SKIN: No rash or lesion. MUSCULOSKELETAL: No joint pain or arthritis.   NEUROLOGIC: No tingling, numbness, weakness.  PSYCHIATRY: Endorses anxiety   MEDICATIONS AT HOME:   Prior to Admission medications   Medication Sig Start Date End Date Taking? Authorizing Provider  albuterol (ACCUNEB) 1.25 MG/3ML nebulizer solution Inhale 3 mLs into the lungs every 6 (six) hours as needed for wheezing.    Yes [provider]  albuterol (PROVENTIL HFA;VENTOLIN HFA) 108 (90 Base) MCG/ACT inhaler Inhale 1-2 puffs into the lungs every 6 (six) hours as needed for wheezing or shortness of breath.   Yes [provider]  atorvastatin (LIPITOR) 20 MG tablet Take 20 mg by mouth at bedtime.    Yes [provider]  azelastine (ASTELIN) 0.1 % nasal spray Place 1 spray into both nostrils 2 (two) times daily.   Yes [provider]  busPIRone (BUSPAR) 10 MG tablet Take 10 mg by mouth 2 (two) times daily.   Yes [provider]  chlorthalidone (HYGROTON) 25 MG tablet Take 25 mg by mouth daily.   Yes [provider]  cholecalciferol (VITAMIN D) 1000 units tablet Take 1,000 Units by mouth daily.   Yes [provider]  cloNIDine (CATAPRES) 0.1 MG tablet Take 0.1 mg by mouth 2 (two) times daily as needed (SBP >170).   Yes [provider]  clopidogrel (PLAVIX) 75 MG tablet Take 75 mg by mouth daily.   Yes [provider]  diazepam (VALIUM) 5 MG tablet Take 5 mg by mouth daily as needed for anxiety.   Yes [provider]  diltiazem (DILACOR XR) 120 MG 24 hr capsule Take 120 mg by mouth daily.   Yes  [provider]  esomeprazole (NEXIUM) 40 MG capsule Take 40 mg by mouth 2 (two) times daily before a meal.    Yes [provider]  ferrous sulfate 325 (65 FE) MG tablet Take 325 mg by mouth daily with breakfast.   Yes [provider]  FLUoxetine (PROZAC) 40 MG capsule Take 40 mg by mouth daily.    Yes [provider]  hydrALAZINE (APRESOLINE) 50 MG tablet Take 50 mg by mouth 3 (three) times daily.   Yes [provider]  HYDROcodone-acetaminophen (NORCO/VICODIN) 5-325 MG tablet Take 1 tablet by mouth 2 (two) times daily as needed for moderate pain or severe pain.    Yes [provider]  levothyroxine (SYNTHROID, LEVOTHROID) 75 MCG tablet Take 1 tablet (75 mcg total) by mouth daily before breakfast. 12/23/16  Yes Tifanie Gardiner, MD  loratadine (CLARITIN) 10 MG tablet Take 10 mg by mouth daily.   Yes [provider]  magnesium oxide (MAG-OX) 400 MG tablet Take 400 mg by mouth 2 (two) times daily.   Yes [provider]  meloxicam (  MOBIC) 7.5 MG tablet Take 7.5 mg by mouth daily.   Yes [provider]  mometasone-formoterol (DULERA) 100-5 MCG/ACT AERO Inhale 2 puffs into the lungs 2 (two) times daily.   Yes [provider]  senna (SENOKOT) 8.6 MG TABS tablet Take 1 tablet by mouth daily as needed for mild constipation.   Yes [provider]  cloNIDine (CATAPRES) 0.1 MG tablet Take 1 tablet (0.1 mg total) by mouth 2 (two) times daily as needed (Take as needed for systolic blood pressure (top number) over 170.  Hold if diastolic (bottom number) is below 70.). Patient not taking: Reported on 03/24/2018 11/24/17 11/24/18  Orbie Pyo, MD  hydrALAZINE (APRESOLINE) 25 MG tablet Take 1 tablet (25 mg total) by mouth every 6 (six) hours. Patient not taking: Reported on 09/02/2018 12/22/16   Nicholes Mango, MD      VITAL SIGNS:  Blood pressure (!) 206/122, pulse (!) 120, temperature 98.1 F (36.7 C), temperature  source Oral, resp. rate (!) 23, SpO2 98 %.  PHYSICAL EXAMINATION:  GENERAL:  85 y.o.-year-old patient lying in the bed with no acute distress.  EYES: Pupils equal, round, reactive to light and accommodation. No scleral icterus. Extraocular muscles intact.  HEENT: Head atraumatic, normocephalic. Oropharynx and nasopharynx clear.  NECK:  Supple, no jugular venous distention. No thyroid enlargement, no tenderness.  LUNGS: Normal breath sounds bilaterally, no wheezing, rales,rhonchi or crepitation. No use of accessory muscles of respiration.  CARDIOVASCULAR: S1, S2 normal. No murmurs, rubs, or gallops.  ABDOMEN: Soft, nontender, nondistended. Bowel sounds present. EXTREMITIES: No pedal edema, cyanosis, or clubbing.  NEUROLOGIC: Awake, alert and oriented x3 sensation intact. Gait not checked.  PSYCHIATRIC: The patient is alert and oriented x 3.  Very anxious SKIN: No obvious rash, lesion, or ulcer.  Positive few bruises  LABORATORY PANEL:   CBC Recent Labs  Lab 09/02/18 0913  WBC 7.2  HGB 14.1  HCT 37.6  PLT 262   ------------------------------------------------------------------------------------------------------------------  Chemistries  Recent Labs  Lab 09/02/18 0913  NA 111*  K 3.1*  CL 74*  CO2 23  GLUCOSE 121*  BUN 10  CREATININE 0.78  CALCIUM 9.2  AST 44*  ALT 29  ALKPHOS 60  BILITOT 1.3*   ------------------------------------------------------------------------------------------------------------------  Cardiac Enzymes Recent Labs  Lab 09/02/18 0913  TROPONINI <0.03   ------------------------------------------------------------------------------------------------------------------  RADIOLOGY:  Dg Chest 2 View  Result Date: 09/02/2018 CLINICAL DATA:  Follow-up pneumonia. Dizziness. EXAM: CHEST - 2 VIEW COMPARISON:  08/08/2018 FINDINGS: Heart size is normal. Chronic aortic atherosclerosis. Bronchial thickening pattern without consolidation, collapse or  effusion. No significant bone finding. Previous left shoulder replacement. IMPRESSION: 1. Bronchitis pattern. No consolidation or collapse. 2. Aortic atherosclerosis. Electronically Signed   By: Nelson Chimes M.D.   On: 09/02/2018 09:57   Ct Head Wo Contrast  Result Date: 09/02/2018 CLINICAL DATA:  Fall, vertigo. EXAM: CT HEAD WITHOUT CONTRAST TECHNIQUE: Contiguous axial images were obtained from the base of the skull through the vertex without intravenous contrast. COMPARISON:  CT scan of June 24, 2018. FINDINGS: Brain: Mild chronic ischemic white matter disease. Stable right basal ganglia lacunar infarction. No mass effect or midline shift is noted. Ventricular size is within normal limits. There is no evidence of mass lesion, hemorrhage or acute infarction. Vascular: No hyperdense vessel or unexpected calcification. Skull: Normal. Negative for fracture or focal lesion. Sinuses/Orbits: No acute finding. Other: None. IMPRESSION: Mild chronic ischemic white matter disease. No acute intracranial abnormality seen. Electronically Signed   By: [name]  Murlean Caller, M.D.   On: 09/02/2018 10:04    EKG:   Orders placed or performed during the hospital encounter of 09/02/18  . EKG 12-Lead  . EKG 12-Lead  . ED EKG  . ED EKG    IMPRESSION AND PLAN:   85 year old pleasant female with history of chronic atrial fibrillation, essential hypertension recently started on chlorthalidone has been feeling dizzy and sustained a fall last night but denies any loss of consciousness is brought into the ED and sodium is at 111 CT head negative  #Symptomatic acute severe hyponatremia Admit patient to stepdown unit Discontinue chlorthalidone which could be the etiology Patient has received 1 L fluid bolus with normal saline in the ED Will provide normal saline at 50 mL/h and monitor serial sodiums and avoid overzealous correction CT head is negative Discussed with nephrology Dr. Zollie Scale Check random urine sodium and  osmolarity   #Malignant hypertension Discontinue chlorthalidone from severe hyponatremia and hydralazine in view of A. fib with RVR Resume home medications Cardizem and clonidine Labetalol IV as needed  #Hypokalemia replete and recheck  #Chronic atrial fibrillation Rate controlled continue Cardizem  #Hypothyroidism continue Synthyroid  #Chronic COPD no exacerbation Albuterol as needed and duo nebs scheduled  #Weakness and fall-probably from severe hyponatremia CT head is negative Hydrate with IV fluids and PT assessment  #Anxiety Valium as needed   All the records are reviewed and case discussed with ED provider. Management plans discussed with the patient, family and they are in agreement.  CODE STATUS: fc   TOTAL CRITICAL CARE TIME TAKING CARE OF THIS PATIENT: 50 minutes.   Note: This dictation was prepared with Dragon dictation along with smaller phrase technology. Any transcriptional errors that result from this process are unintentional.  Nicholes Mango M.D on 09/02/2018 at 11:00 AM  Between 7am to 6pm - Pager - [phone]  After 6pm go to [email or web address] - password [email or web address] [facility]  Office  [phone]  CC: Primary care physician; [name], MD

## 2018-09-02 NOTE — ED Provider Notes (Signed)
[facility] Emergency Department Provider Note  ____________________________________________   First MD Initiated Contact with Patient 09/02/18 [id number]     (approximate)  I have reviewed the triage vital signs and the nursing notes.   HISTORY  Chief Complaint Dizziness and Fall   HPI Janice Perkins is a 85 y.o. female with a history of atrial fibrillation, COPD, hypertension and anxiety who is presenting to the emergency department today complaining of dizziness over the past 1 to 2 weeks.  Says that she is a spinning sensation when standing and walking and when moving her head from side to side.  She states that she had a fall last night where she "fell on my bottom."  She is denying any pain at this time.  Denies hitting her head or losing consciousness.  Denies any headache.  Denies any hip or limb pain.  Transported by EMS who found the patient to be hypertensive but she has not taken her morning medications.  Also with recent cough and placed on chlorthalidone by her cardiologist.  Says that she took a Xanax earlier this morning and was able to sleep for several hours but awoke feeling dizzy and shaky once again.   Past Medical History:  Diagnosis Date  . Asthma   . Atrial fibrillation ([place])   . Breast cancer ([place]) 2011  . Bronchitis 04/2015  . Cancer [facility]) 2011   breast  . COPD (chronic obstructive pulmonary disease) ([place])   . Hypertension   . Shingles    10/2015    Patient Active Problem List   Diagnosis Date Noted  . Hypoxia 05/30/2018  . Leukocytosis 04/05/2018  . Pneumonia 11/15/2017  . Pleural effusion 05/16/2017  . AF (paroxysmal atrial fibrillation) ([place]) 05/16/2017  . Breast cancer ([place]) 05/16/2017  . Dependence on nocturnal oxygen therapy 05/16/2017  . HTN (hypertension) 05/16/2017  . Generalized weakness 03/07/2017  . Hyponatremia 03/07/2017  . Dehydration 03/07/2017  . UTI (urinary tract infection) 03/07/2017  . HCAP  (healthcare-associated pneumonia) 01/19/2017  . AKI (acute kidney injury) ([place]) 12/18/2016  . Primary cancer of upper outer quadrant of right female breast ([place]) 04/07/2016  . Lumbar radiculopathy 03/23/2016  . Spinal stenosis, lumbar region, with neurogenic claudication 03/23/2016  . DDD (degenerative disc disease), lumbar 01/14/2015  . Lumbosacral facet joint syndrome 01/14/2015  . Sacroiliac joint dysfunction 01/14/2015  . Greater trochanteric bursitis 01/14/2015  . Bilateral occipital neuralgia 01/14/2015    Past Surgical History:  Procedure Laterality Date  . BREAST EXCISIONAL BIOPSY Right 11/29/13   two areas FAT NECROSIS  . BREAST EXCISIONAL BIOPSY Right 10/30/2009   lumpectomy - radiation  . COLONOSCOPY WITH PROPOFOL N/A 06/10/2018   Procedure: COLONOSCOPY WITH PROPOFOL;  Surgeon: Manya Silvas, MD;  Location: [facility] ENDOSCOPY;  Service: Endoscopy;  Laterality: N/A;  . ESOPHAGOGASTRODUODENOSCOPY (EGD) WITH PROPOFOL N/A 06/10/2018   Procedure: ESOPHAGOGASTRODUODENOSCOPY (EGD) WITH PROPOFOL;  Surgeon: Manya Silvas, MD;  Location: [facility] ENDOSCOPY;  Service: Endoscopy;  Laterality: N/A;  . NASAL SINUS SURGERY      Prior to Admission medications   Medication Sig Start Date End Date Taking? Authorizing Provider  albuterol (ACCUNEB) 1.25 MG/3ML nebulizer solution Inhale 3 mLs into the lungs every 6 (six) hours as needed for wheezing.    Yes [provider]  albuterol (PROVENTIL HFA;VENTOLIN HFA) 108 (90 Base) MCG/ACT inhaler Inhale 1-2 puffs into the lungs every 6 (six) hours as needed for wheezing or shortness of breath.   Yes [provider]  atorvastatin (LIPITOR) 20 MG tablet Take 20 mg by mouth at bedtime.    Yes [provider]  aspirin EC 81 MG tablet Take 81 mg by mouth daily.    [provider]  cholecalciferol (VITAMIN D) 1000 units tablet Take 1,000 Units by mouth daily.    [provider]  cloNIDine (CATAPRES) 0.1 MG tablet  Take 1 tablet (0.1 mg total) by mouth 2 (two) times daily as needed (Take as needed for systolic blood pressure (top number) over 170.  Hold if diastolic (bottom number) is below 70.). Patient not taking: Reported on 03/24/2018 11/24/17 11/24/18  Welby Montminy, Randall An, MD  diltiazem (DILACOR XR) 120 MG 24 hr capsule Take 120 mg by mouth daily.    [provider]  esomeprazole (NEXIUM) 40 MG capsule Take 40 mg by mouth 2 (two) times daily before a meal.     [provider]  ferrous sulfate 325 (65 FE) MG tablet Take 325 mg by mouth daily with breakfast.    [provider]  FLUoxetine (PROZAC) 10 MG tablet Take 10 mg by mouth daily.    [provider]  fluticasone (FLONASE) 50 MCG/ACT nasal spray Place 2 sprays into both nostrils daily.     [provider]  Fluticasone-Salmeterol (ADVAIR) 250-50 MCG/DOSE AEPB Inhale 1 puff into the lungs 2 (two) times daily.    [provider]  hydrALAZINE (APRESOLINE) 25 MG tablet Take 1 tablet (25 mg total) by mouth every 6 (six) hours. 12/22/16   Nicholes Mango, MD  HYDROcodone-acetaminophen (NORCO/VICODIN) 5-325 MG tablet Take 1 tablet by mouth 2 (two) times daily.     [provider]  levothyroxine (SYNTHROID, LEVOTHROID) 75 MCG tablet Take 1 tablet (75 mcg total) by mouth daily before breakfast. 12/23/16   Gouru, Illene Silver, MD  magnesium oxide (MAG-OX) 400 MG tablet Take 400 mg by mouth 2 (two) times daily.    [provider]  montelukast (SINGULAIR) 10 MG tablet Take 10 mg by mouth at bedtime.     [provider]  ondansetron (ZOFRAN) 4 MG tablet Take 1 tablet by mouth daily as needed for nausea.     [provider]  senna (SENOKOT) 8.6 MG TABS tablet Take 1 tablet by mouth daily as needed for mild constipation.    [provider]  spironolactone (ALDACTONE) 25 MG tablet Take 25 mg by mouth daily.    [provider]    Allergies Diphenhydramine hcl; Penicillins;  Captopril; Enalapril maleate; Tape; Terfenadine; Augmentin [amoxicillin-pot clavulanate]; and Biaxin [clarithromycin]  Family History  Problem Relation Age of Onset  . Hypertension Mother   . Heart disease Mother   . Cancer Mother   . Stroke Father   . Hypertension Father   . Breast cancer Sister     Social History Social History   Tobacco Use  . Smoking status: Never Smoker  . Smokeless tobacco: Never Used  Substance Use Topics  . Alcohol use: No    Alcohol/week: 0.0 standard drinks  . Drug use: No    Review of Systems  Constitutional: No fever/chills Eyes: No visual changes. ENT: No sore throat. Cardiovascular: Denies chest pain. Respiratory: Productive cough as above.  Productive of clear sputum. Gastrointestinal: No abdominal pain.  No nausea, no vomiting.  No diarrhea.  No constipation. Genitourinary: Negative for dysuria. Musculoskeletal: Negative for back pain. Skin: Negative for rash. Neurological: Negative for headaches, focal weakness or numbness.  ____________________________________________   PHYSICAL EXAM:  VITAL SIGNS: ED Triage Vitals  Enc Vitals Group     BP --      Pulse Rate 09/02/18 0910 77     Resp 09/02/18 0910 (!) 29     Temp 09/02/18 0910 98.1 F (36.7 C)     Temp Source 09/02/18 0910 Oral     SpO2 09/02/18 0910 99 %     Weight --      Height --      Head Circumference --      Peak Flow --      Pain Score 09/02/18 0906 0     Pain Loc --      Pain Edu? --      Excl. in [place]? --     Constitutional: Alert and oriented. Well appearing and in no acute distress. Eyes: Conjunctivae are normal.  Head: Atraumatic.  Normal TMs bilaterally. Nose: No congestion/rhinnorhea. Mouth/Throat: Mucous membranes are moist.  Neck: No stridor.  No tenderness to the cervical spine.  No deformity or step-off. Cardiovascular: Normal rate, regular rhythm. Grossly normal heart sounds.   Respiratory: Normal respiratory effort.  No retractions. Lungs  CTAB. Gastrointestinal: Soft and nontender. No distention. Musculoskeletal: No lower extremity tenderness nor edema.  No joint effusions.  5 out of 5 strength of the bilateral lower extremities.  No tenderness to the bilateral hips. Neurologic:  Normal speech and language. No gross focal neurologic deficits are appreciated.  No nystagmus.  No ataxia on finger-nose testing. Skin:  Skin is warm, dry and intact. No rash noted. Psychiatric: Mood and affect are normal. Speech and behavior are normal.  ____________________________________________   LABS (all labs ordered are listed, but only abnormal results are displayed)  Labs Reviewed  CBC - Abnormal; Notable for the following components:      Result Value   MCHC 37.5 (*)    All other components within normal limits  COMPREHENSIVE METABOLIC PANEL - Abnormal; Notable for the following components:   Sodium 111 (*)    Potassium 3.1 (*)    Chloride 74 (*)    Glucose, Bld 121 (*)    AST 44 (*)    Total Bilirubin 1.3 (*)    All other components within normal limits  ETHANOL  PROTIME-INR  APTT  DIFFERENTIAL  TROPONIN I  URINE DRUG SCREEN, QUALITATIVE ([facility] ONLY)  URINALYSIS, ROUTINE W REFLEX MICROSCOPIC   ____________________________________________  EKG  ED ECG REPORT I, Doran Stabler, the attending physician, personally viewed and interpreted this ECG.   Date: 09/02/2018  EKG Time: 0909  Rate: 89  Rhythm: atrial fibrillation, rate 89  Axis: Normal axis  Intervals: Borderline wide QRS  ST&T Change: No ST segment elevation or depression.  Single T wave inversion in aVL  ____________________________________________  RADIOLOGY  Chest x-ray with bronchitis pattern.  No consolidation.  CT head with mild chronic ischemic white matter disease without acute intracranial abnormality. ____________________________________________   PROCEDURES  Procedure(s) performed:   .Critical Care Performed by: Orbie Pyo, MD Authorized by: Orbie Pyo, MD   Critical care provider statement:    Critical care time (minutes):  35   Critical care time was exclusive of:  Separately billable procedures and treating other patients   Critical care was necessary to treat or prevent imminent or life-threatening deterioration of the following conditions:  Metabolic crisis   Critical care was time spent personally by me on the following activities:  Development of treatment plan with patient or surrogate, discussions with consultants, evaluation of patient's response to treatment, examination  of patient, obtaining history from patient or surrogate, ordering and performing treatments and interventions, ordering and review of laboratory studies, ordering and review of radiographic studies, pulse oximetry, re-evaluation of patient's condition and review of old [place] performed:   ____________________________________________   INITIAL IMPRESSION / [place] / ED COURSE  Pertinent labs & imaging results that were available during my care of the patient were reviewed by me and considered in my medical decision making (see chart for details).  DDX: Vertigo, hypertension, UTI, CVA, electrolyte abnormality, kidney failure, anxiety, AMI, anemia As part of my medical decision making, I reviewed the following data within the [place] Notes from prior ED visits  ----------------------------------------- 10:08 AM on 09/02/2018 -----------------------------------------  Patient at this time still feeling "shaky."  I believe this is likely secondary to her sodium which was found to be 111.  Started on normal saline at 500 cc/h.  Will be admitted to the ICU.  Patient and family updated regarding low sodium.  Suspect chlorthalidone is exacerbating factors the patient had a somewhat baseline low sodium in the low 130s and high 120s.  Patient will be admitted to the ICU.   Signed out to Dr. Tressia Miners. ____________________________________________   FINAL CLINICAL IMPRESSION(S) / ED DIAGNOSES  Symptomatic hyponatremia    NEW MEDICATIONS STARTED DURING THIS VISIT:  New Prescriptions   No medications on file     Note:  This document was prepared using Dragon voice recognition software and may include unintentional dictation errors.     Orbie Pyo, MD 09/02/18 1010

## 2018-09-02 NOTE — ED Notes (Signed)
Pt in CT scan. Family at bedside. 

## 2018-09-02 NOTE — ED Notes (Signed)
ED TO INPATIENT HANDOFF REPORT  Name/Age/Gender Janice Perkins 85 y.o. female  Code Status Code Status History    Date Active Date Inactive Code Status Order ID Comments User Context   05/30/2018 1127 05/31/2018 1740 Full Code [id number]  Bettey Costa, MD Inpatient   11/15/2017 1829 11/21/2017 1821 Full Code [id number]  Vaughan Basta, MD Inpatient   05/16/2017 0041 05/17/2017 1824 Full Code [id number]  Quintella Baton, MD ED   03/07/2017 1446 03/09/2017 1509 Full Code [id number]  Idelle Crouch, MD Inpatient   01/19/2017 0837 01/21/2017 1415 Full Code [id number]  Harrie Foreman, MD Inpatient   12/18/2016 0718 12/22/2016 1947 Full Code [id number]  Harrie Foreman, MD Inpatient      Home/SNF/Other Home  Chief Complaint Weakness/Fall   Level of Care/Admitting Diagnosis ED Disposition    ED Disposition Condition [place]: [place] [100120]  Level of Care: Stepdown [14]  Diagnosis: Hyponatremia [592924]  Admitting Physician: Nicholes Mango [[id number]]  Attending Physician: Nicholes Mango [[id number]]  Estimated length of stay: 3 - 4 days  Certification:: I certify this patient will need inpatient services for at least 2 midnights  PT Class (Do Not Modify): Inpatient [101]  PT Acc Code (Do Not Modify): Private [1]       Medical History Past Medical History:  Diagnosis Date  . Asthma   . Atrial fibrillation ([place])   . Breast cancer ([place]) 2011  . Bronchitis 04/2015  . Cancer [facility]) 2011   breast  . COPD (chronic obstructive pulmonary disease) ([place])   . Hypertension   . Shingles    10/2015    Allergies Allergies  Allergen Reactions  . Diphenhydramine Hcl Rash  . Penicillins Hives    .Has patient had a PCN reaction causing immediate rash, facial/tongue/throat swelling, SOB or lightheadedness with hypotension: Unknown Has patient had a PCN reaction causing severe rash involving mucus membranes or skin necrosis: Unknown Has patient had a PCN  reaction that required hospitalization: Unknown Has patient had a PCN reaction occurring within the last 10 years: Unknown If all of the above answers are "NO", then may proceed with Cephalosporin use.   . Captopril Other (See Comments)  . Enalapril Maleate     Other reaction(s): Unknown  . Tape Itching  . Terfenadine Other (See Comments)  . Augmentin [Amoxicillin-Pot Clavulanate] Rash    Has patient had a PCN reaction causing immediate rash, facial/tongue/throat swelling, SOB or lightheadedness with hypotension: Unknown Has patient had a PCN reaction causing severe rash involving mucus membranes or skin necrosis: Unknown Has patient had a PCN reaction that required hospitalization: Unknown Has patient had a PCN reaction occurring within the last 10 years: Unknown If all of the above answers are "NO", then may proceed with Cephalosporin use.  . Biaxin [Clarithromycin] Rash, Other (See Comments) and Hives    Other reaction(s): Unknown    IV Location/Drains/Wounds Patient Lines/Drains/Airways Status   Active Line/Drains/Airways    Name:   Placement date:   Placement time:   Site:   Days:   Peripheral IV 09/02/18 Left Wrist   09/02/18    -    Wrist   less than 1   Peripheral IV 09/02/18 Right Antecubital   09/02/18    1009    Antecubital   less than 1          Labs/Imaging Results for orders placed or performed during the hospital encounter of 09/02/18 (from the past 48 hour(s))  Ethanol     Status: None   Collection Time: 09/02/18  9:13 AM  Result Value Ref Range   Alcohol, Ethyl (B) <10 <10 mg/dL    Comment: (NOTE) Lowest detectable limit for serum alcohol is 10 mg/dL. For medical purposes only. Performed at [facility], [address] [place], [place] [address]   Protime-INR     Status: None   Collection Time: 09/02/18  9:13 AM  Result Value Ref Range   Prothrombin Time 11.9 11.4 - 15.2 seconds   INR 0.88     Comment: Performed at [facility], [address] [place], [place] [address]  APTT     Status: None   Collection Time: 09/02/18  9:13 AM  Result Value Ref Range   aPTT 33 24 - 36 seconds    Comment: Performed at [facility], [address] [place], [place] [address]  CBC     Status: Abnormal   Collection Time: 09/02/18  9:13 AM  Result Value Ref Range   WBC 7.2 4.0 - 10.5 K/uL   RBC 4.62 3.87 - 5.11 MIL/uL   Hemoglobin 14.1 12.0 - 15.0 g/dL   HCT 37.6 36.0 - 46.0 %   MCV 81.4 80.0 - 100.0 fL   MCH 30.5 26.0 - 34.0 pg   MCHC 37.5 (H) 30.0 - 36.0 g/dL   RDW 13.2 11.5 - 15.5 %   Platelets 262 150 - 400 K/uL   nRBC 0.0 0.0 - 0.2 %    Comment: Performed at [facility], [address] [place],  [address]  Differential     Status: None   Collection Time: 09/02/18  9:13 AM  Result Value Ref Range   Neutrophils Relative % 64 %   Neutro Abs 4.7 1.7 - 7.7 K/uL   Lymphocytes Relative 22 %   Lymphs Abs 1.6 0.7 - 4.0 K/uL   Monocytes Relative 12 %   Monocytes Absolute 0.9 0.1 - 1.0 K/uL   Eosinophils Relative 1 %   Eosinophils Absolute 0.1 0.0 - 0.5 K/uL   Basophils Relative 0 %   Basophils Absolute 0.0 0.0 - 0.1 K/uL   Immature Granulocytes 1 %   Abs Immature Granulocytes 0.04 0.00 - 0.07 K/uL    Comment: Performed at [facility], [address] [place],  [address]  Comprehensive metabolic panel     Status: Abnormal   Collection Time: 09/02/18  9:13 AM  Result Value Ref Range   Sodium 111 (LL) 135 - 145 mmol/L    Comment: CRITICAL RESULT CALLED TO, READ BACK BY AND VERIFIED WITH Alizia Greif AT 8502 09/02/2018.PMF   Potassium 3.1 (L) 3.5 - 5.1 mmol/L   Chloride 74 (L) 98 - 111 mmol/L   CO2 23 22 - 32 mmol/L   Glucose, Bld 121 (H) 70 - 99 mg/dL   BUN 10 8 - 23 mg/dL   Creatinine, Ser 0.78 0.44 - 1.00 mg/dL   Calcium 9.2 8.9 - 10.3 mg/dL   Total Protein 7.5 6.5 - 8.1 g/dL   Albumin 4.2 3.5 - 5.0 g/dL   AST 44 (H) 15 - 41 U/L   ALT 29 0 - 44 U/L   Alkaline  Phosphatase 60 38 - 126 U/L   Total Bilirubin 1.3 (H) 0.3 - 1.2 mg/dL   GFR calc non Af Amer >60 >60 mL/min   GFR calc Af Amer >60 >60 mL/min   Anion gap 14 5 - 15    Comment:  Performed at [facility], [address] [place], [place] [address]  Troponin I - ONCE - STAT     Status: None   Collection Time: 09/02/18  9:13 AM  Result Value Ref Range   Troponin I <0.03 <0.03 ng/mL    Comment: Performed at [facility], [address] [place], [place] [address]  Urine Drug Screen, Qualitative     Status: Abnormal   Collection Time: 09/02/18  9:18 AM  Result Value Ref Range   Tricyclic, Ur Screen NONE DETECTED NONE DETECTED   Amphetamines, Ur Screen NONE DETECTED NONE DETECTED   MDMA (Ecstasy)Ur Screen NONE DETECTED NONE DETECTED   Cocaine Metabolite,Ur [place] NONE DETECTED NONE DETECTED   Opiate, Ur Screen POSITIVE (A) NONE DETECTED   Phencyclidine (PCP) Ur S NONE DETECTED NONE DETECTED   Cannabinoid 50 Ng, Ur [place] NONE DETECTED NONE DETECTED   Barbiturates, Ur Screen NONE DETECTED NONE DETECTED   Benzodiazepine, Ur Scrn POSITIVE (A) NONE DETECTED   Methadone Scn, Ur NONE DETECTED NONE DETECTED    Comment: (NOTE) Tricyclics + metabolites, urine    Cutoff 1000 ng/mL Amphetamines + metabolites, urine  Cutoff 1000 ng/mL MDMA (Ecstasy), urine              Cutoff 500 ng/mL Cocaine Metabolite, urine          Cutoff 300 ng/mL Opiate + metabolites, urine        Cutoff 300 ng/mL Phencyclidine (PCP), urine         Cutoff 25 ng/mL Cannabinoid, urine                 Cutoff 50 ng/mL Barbiturates + metabolites, urine  Cutoff 200 ng/mL Benzodiazepine, urine              Cutoff 200 ng/mL Methadone, urine                   Cutoff 300 ng/mL The urine drug screen provides only a preliminary, unconfirmed analytical test result and should not be used for non-medical purposes. Clinical consideration and professional judgment should be applied to any positive drug screen result due to  possible interfering substances. A more specific alternate chemical method must be used in order to obtain a confirmed analytical result. Gas chromatography / mass spectrometry (GC/MS) is the preferred confirmat ory method. Performed at [facility], [address] [place], [place] [address]   Urinalysis, Routine w reflex microscopic     Status: Abnormal   Collection Time: 09/02/18  9:36 AM  Result Value Ref Range   Color, Urine STRAW (A) YELLOW   APPearance CLEAR (A) CLEAR   Specific Gravity, Urine 1.006 1.005 - 1.030   pH 8.0 5.0 - 8.0   Glucose, UA NEGATIVE NEGATIVE mg/dL   Hgb urine dipstick NEGATIVE NEGATIVE   Bilirubin Urine NEGATIVE NEGATIVE   Ketones, ur 5 (A) NEGATIVE mg/dL   Protein, ur 100 (A) NEGATIVE mg/dL   Nitrite NEGATIVE NEGATIVE   Leukocytes, UA NEGATIVE NEGATIVE   RBC / HPF 0-5 0 - 5 RBC/hpf   WBC, UA 0-5 0 - 5 WBC/hpf   Bacteria, UA NONE SEEN NONE SEEN   Squamous Epithelial / LPF NONE SEEN 0 - 5    Comment: Performed at [facility], [address], [place],  [address]   Dg Chest 2 View  Result Date: 09/02/2018 CLINICAL DATA:  Follow-up pneumonia. Dizziness. EXAM: CHEST - 2 VIEW COMPARISON:  08/08/2018 FINDINGS: Heart size is normal. Chronic  aortic atherosclerosis. Bronchial thickening pattern without consolidation, collapse or effusion. No significant bone finding. Previous left shoulder replacement. IMPRESSION: 1. Bronchitis pattern. No consolidation or collapse. 2. Aortic atherosclerosis. Electronically Signed   By: Nelson Chimes M.D.   On: 09/02/2018 09:57   Ct Head Wo Contrast  Result Date: 09/02/2018 CLINICAL DATA:  Fall, vertigo. EXAM: CT HEAD WITHOUT CONTRAST TECHNIQUE: Contiguous axial images were obtained from the base of the skull through the vertex without intravenous contrast. COMPARISON:  CT scan of June 24, 2018. FINDINGS: Brain: Mild chronic ischemic white matter disease. Stable right basal ganglia lacunar  infarction. No mass effect or midline shift is noted. Ventricular size is within normal limits. There is no evidence of mass lesion, hemorrhage or acute infarction. Vascular: No hyperdense vessel or unexpected calcification. Skull: Normal. Negative for fracture or focal lesion. Sinuses/Orbits: No acute finding. Other: None. IMPRESSION: Mild chronic ischemic white matter disease. No acute intracranial abnormality seen. Electronically Signed   By: Marijo Conception, M.D.   On: 09/02/2018 10:04    Pending Labs Unresulted Labs (From admission, onward)    Start     Ordered   09/02/18 1054  Sodium, urine, random  ONCE - STAT,   STAT     09/02/18 1054   09/02/18 1054  Osmolality, urine  Once,   STAT     09/02/18 1054   09/02/18 1054  Sodium  Now then every 4 hours,   STAT     09/02/18 1054   Signed and Held  CBC  (enoxaparin (LOVENOX)    CrCl >/= 30 ml/min)  Once,   R    Comments:  Baseline for enoxaparin therapy IF NOT ALREADY DRAWN.  Notify MD if PLT < 100 K.    Signed and Held   Signed and Held  Creatinine, serum  (enoxaparin (LOVENOX)    CrCl >/= 30 ml/min)  Once,   R    Comments:  Baseline for enoxaparin therapy IF NOT ALREADY DRAWN.    Signed and Held   Signed and Held  Creatinine, serum  (enoxaparin (LOVENOX)    CrCl >/= 30 ml/min)  Weekly,   R    Comments:  while on enoxaparin therapy    Signed and Held   Signed and Held  TSH  Tomorrow morning,   R     Signed and Held   Signed and Held  Comprehensive metabolic panel  Tomorrow morning,   R     Signed and Held   Signed and Held  CBC  Tomorrow morning,   R     Signed and Held          Vitals/Pain Today's Vitals   09/02/18 0906 09/02/18 0910 09/02/18 0930 09/02/18 1003  BP:  (!) 203/111 (!) 193/95 (!) 206/122  Pulse:  77 75 (!) 120  Resp:  19 17 (!) 23  Temp:  98.1 F (36.7 C)    TempSrc:  Oral    SpO2:  99% 99% 98%  PainSc: 0-No pain       Isolation Precautions No active isolations  Medications Medications  sodium  chloride 0.9 % bolus 1,000 mL (1,000 mLs Intravenous Transfusing/Transfer 09/02/18 1116)  labetalol (NORMODYNE,TRANDATE) injection 10 mg (has no administration in time range)  0.9 %  sodium chloride infusion (has no administration in time range)  ALPRAZolam (XANAX) tablet 0.25 mg (has no administration in time range)  potassium chloride SA (K-DUR,KLOR-CON) CR tablet 40 mEq (has no administration in time range)  meclizine (  ANTIVERT) tablet 25 mg (25 mg Oral Given 09/02/18 0935)  labetalol (NORMODYNE,TRANDATE) injection 10 mg (10 mg Intravenous Given 09/02/18 1023)    Mobility walks

## 2018-09-02 NOTE — ED Notes (Signed)
Admitting MD at bedside.

## 2018-09-02 NOTE — ED Notes (Signed)
Family at bedside. Pt remains to be forgetful.  Agitated.

## 2018-09-02 NOTE — Consult Note (Addendum)
Name: Janice Perkins MRN: [id number] DOB: 05-Sep-1932     CONSULTATION DATE: 09/02/2018  REFERRING MD :  Nicholes Mango, MD  CHIEF COMPLAINT:  Dizziness x 1wk  SIGNIFICANT EVENTS/STUDIES:  1/14 - Given Chlorthalidone for persistent HTN 1/16 - Given Valium for anxiety, took 1 dose, had a fall at night w/o LOC 1/17 - Arrived at ER with dizziness. Negative head CT w/o contrast, Na 111, received 1L of fluid.  1/17 - Transfer to ICU    HISTORY OF PRESENT ILLNESS:  Patient is a 85 y.o. female with medical history of Chronic Atrial Fibrillation, COPD, Hypertension, Hypothyroidism, Asthma, Breast Cancer, Anxiety, Shingles, presents to the ER with dizziness. Pt reports her blood pressure has been high, has ongoing dizziness for about a week, and feels like the room is spinning. She has been taking her medications daily. Went to see her cardiologist Dr. Ubaldo Glassing on 1/14 for HTN and was given Chlorthalidone 25 MG tablet. She continues to feel dizzy and went to see her PCP on 1/16 when she was given Valium for anxiety and took 1 dose at night. She reports had a fall on night of 1/16 on her back, did not hit her head and no LOC. She woke up this morning but continues to feel dizzy and shaky, and called EMS. She states she has been taking Xanax for her anxiety in the past. She did not take any medications prior to EMS arrival, and she was given 1 dose of Xanax when she was transferred to ICU. Her baseline sodium has been around 120s to low 130s since 06/2017, and her Na was 111 at the ED. Last ECHO was 55% on 08/2015. She reports on chronic home O2 when she sleeps, 2L on [place]. At ICU, she reports still feeling dizzy and is asking for her anxiety medications. Denies chest pain, SOB, abdominal pain.    PAST MEDICAL HISTORY :   has a past medical history of Asthma, Atrial fibrillation ([place]), Breast cancer ([place]) (2011), Bronchitis (04/2015), Cancer ([place]) (2011), COPD (chronic obstructive pulmonary disease) ([place]),  Hypertension, and Shingles.  has a past surgical history that includes Breast excisional biopsy (Right, 11/29/13); Breast excisional biopsy (Right, 10/30/2009); Nasal sinus surgery; Colonoscopy with propofol (N/A, 06/10/2018); and Esophagogastroduodenoscopy (egd) with propofol (N/A, 06/10/2018). Prior to Admission medications   Medication Sig Start Date End Date Taking? Authorizing Provider  albuterol (ACCUNEB) 1.25 MG/3ML nebulizer solution Inhale 3 mLs into the lungs every 6 (six) hours as needed for wheezing.    Yes [provider]  albuterol (PROVENTIL HFA;VENTOLIN HFA) 108 (90 Base) MCG/ACT inhaler Inhale 1-2 puffs into the lungs every 6 (six) hours as needed for wheezing or shortness of breath.   Yes [provider]  atorvastatin (LIPITOR) 20 MG tablet Take 20 mg by mouth at bedtime.    Yes [provider]  azelastine (ASTELIN) 0.1 % nasal spray Place 1 spray into both nostrils 2 (two) times daily.   Yes [provider]  busPIRone (BUSPAR) 10 MG tablet Take 10 mg by mouth 2 (two) times daily.   Yes [provider]  chlorthalidone (HYGROTON) 25 MG tablet Take 25 mg by mouth daily.   Yes [provider]  cholecalciferol (VITAMIN D) 1000 units tablet Take 1,000 Units by mouth daily.   Yes [provider]  cloNIDine (CATAPRES) 0.1 MG tablet Take 0.1 mg by mouth 2 (two) times daily as needed (SBP >170).   Yes [provider]  clopidogrel (PLAVIX) 75 MG tablet  Take 75 mg by mouth daily.   Yes [provider]  diazepam (VALIUM) 5 MG tablet Take 5 mg by mouth daily as needed for anxiety.   Yes [provider]  diltiazem (DILACOR XR) 120 MG 24 hr capsule Take 120 mg by mouth daily.   Yes [provider]  esomeprazole (NEXIUM) 40 MG capsule Take 40 mg by mouth 2 (two) times daily before a meal.    Yes [provider]  ferrous sulfate 325 (65 FE) MG tablet Take 325 mg by mouth daily with breakfast.    Yes [provider]  FLUoxetine (PROZAC) 40 MG capsule Take 40 mg by mouth daily.    Yes [provider]  hydrALAZINE (APRESOLINE) 50 MG tablet Take 50 mg by mouth 3 (three) times daily.   Yes [provider]  HYDROcodone-acetaminophen (NORCO/VICODIN) 5-325 MG tablet Take 1 tablet by mouth 2 (two) times daily as needed for moderate pain or severe pain.    Yes [provider]  levothyroxine (SYNTHROID, LEVOTHROID) 75 MCG tablet Take 1 tablet (75 mcg total) by mouth daily before breakfast. 12/23/16  Yes Gouru, Aruna, MD  loratadine (CLARITIN) 10 MG tablet Take 10 mg by mouth daily.   Yes [provider]  magnesium oxide (MAG-OX) 400 MG tablet Take 400 mg by mouth 2 (two) times daily.   Yes [provider]  meloxicam (MOBIC) 7.5 MG tablet Take 7.5 mg by mouth daily.   Yes [provider]  mometasone-formoterol (DULERA) 100-5 MCG/ACT AERO Inhale 2 puffs into the lungs 2 (two) times daily.   Yes [provider]  senna (SENOKOT) 8.6 MG TABS tablet Take 1 tablet by mouth daily as needed for mild constipation.   Yes [provider]  cloNIDine (CATAPRES) 0.1 MG tablet Take 1 tablet (0.1 mg total) by mouth 2 (two) times daily as needed (Take as needed for systolic blood pressure (top number) over 170.  Hold if diastolic (bottom number) is below 70.). Patient not taking: Reported on 03/24/2018 11/24/17 11/24/18  Orbie Pyo, MD  hydrALAZINE (APRESOLINE) 25 MG tablet Take 1 tablet (25 mg total) by mouth every 6 (six) hours. Patient not taking: Reported on 09/02/2018 12/22/16   Nicholes Mango, MD   Allergies  Allergen Reactions  . Diphenhydramine Hcl Rash  . Penicillins Hives    .Has patient had a PCN reaction causing immediate rash, facial/tongue/throat swelling, SOB or lightheadedness with hypotension: Unknown Has patient had a PCN reaction causing severe rash involving mucus membranes or skin necrosis: Unknown Has patient  had a PCN reaction that required hospitalization: Unknown Has patient had a PCN reaction occurring within the last 10 years: Unknown If all of the above answers are "NO", then may proceed with Cephalosporin use.   . Captopril Other (See Comments)  . Enalapril Maleate     Other reaction(s): Unknown  . Tape Itching  . Terfenadine Other (See Comments)  . Augmentin [Amoxicillin-Pot Clavulanate] Rash    Has patient had a PCN reaction causing immediate rash, facial/tongue/throat swelling, SOB or lightheadedness with hypotension: Unknown Has patient had a PCN reaction causing severe rash involving mucus membranes or skin necrosis: Unknown Has patient had a PCN reaction that required hospitalization: Unknown Has patient had a PCN reaction occurring within the last 10 years: Unknown If all of the above answers are "NO", then may proceed with Cephalosporin use.  . Biaxin [Clarithromycin] Rash, Other (See Comments) and Hives    Other reaction(s): Unknown    FAMILY  HISTORY:  family history includes Breast cancer in her sister; Cancer in her mother; Heart disease in her mother; Hypertension in her father and mother; Stroke in her father. SOCIAL HISTORY:  reports that she has never smoked. She has never used smokeless tobacco. She reports that she does not drink alcohol or use drugs.  REVIEW OF SYSTEMS:   Review of Systems  Constitutional: Negative for chills and fever.  Respiratory: Negative for cough, shortness of breath and wheezing.   Cardiovascular: Positive for leg swelling. Negative for chest pain.  Gastrointestinal: Negative for abdominal pain, constipation and diarrhea.  Musculoskeletal: Positive for falls (Last night, no LOC, on her back).  Neurological: Positive for dizziness and headaches. Negative for loss of consciousness.  All other systems reviewed and are negative.   VITAL SIGNS: Temp:  [98.1 F (36.7 C)] 98.1 F (36.7 C) (01/17 0910) Pulse Rate:  [53-120] 65 (01/17  1200) Resp:  [16-23] 16 (01/17 1200) BP: (167-206)/(87-122) 167/87 (01/17 1200) SpO2:  [98 %-100 %] 100 % (01/17 1200)  Physical Examination:  GENERAL: In no acute distress HEAD: Normocephalic, atraumatic.  EYES: Pupils equal, round, reactive to light.  No scleral icterus.  MOUTH: Moist mucosal membrane. NECK: Supple. No JVD.  PULMONARY: Clear lung sound bilaterally, no work of breathing CARDIOVASCULAR: S1 and S2. Regular rate and rhythm. No murmurs, rubs, or gallops.  GASTROINTESTINAL: Soft, nontender, -distended. No masses. Positive bowel sounds. No hepatosplenomegaly.  MUSCULOSKELETAL: BLE 1+ edema.  NEUROLOGIC: Alert, awake, oriented. SKIN:intact,warm,dry  I personally reviewed lab work that was obtained in last 24 hrs.  CXR Independently reviewed- 1. Bronchitis pattern. No consolidation or collapse. 2. Aortic atherosclerosis. Head CT w/o contrast: Mild chronic ischemic white matter disease. No acute intracranial abnormality seen.   ASSESSMENT / PLAN:  Patient is a 85 y.o. female presents at ICU Stepdown status with dizziness and severe hyponatremia with uncontrolled HTN   Severe hypotonic Hyponatremia, likely related to diuretics. She rcieved one liter of saline in ER --Continue normal saline at 50 mL/h -Monitor serial sodiums, random urine sodium and osmolarity If NA doesn't improve consider 3% saline.  Malignant HTN with Afib RVR -Continue diltiazem -Labetalol PRN -Clonidine PRN  Anxiety -Xanax PRN  Hypothyroidism -Check TSH  Jo-ku Teng, PA-Student   Pt seen and examined. Above note reviewed edited/addended by me. Will repeat serum na if no sig change will consider 3% saline. She is full code

## 2018-09-02 NOTE — ED Notes (Signed)
Date and time results received: 09/02/18 0947 (use smartphrase ".now" to insert current time)  Test: NA Critical Value: 111  Name of Provider Notified: [email or web address]

## 2018-09-02 NOTE — ED Triage Notes (Signed)
Pt being treated for PNA currently. Has been having dizziness and fell last night. Generalized weakness.  Has been started on valium this week for anxiety.

## 2018-09-03 DIAGNOSIS — R9431 Abnormal electrocardiogram [ECG] [EKG]: Secondary | ICD-10-CM

## 2018-09-03 LAB — COMPREHENSIVE METABOLIC PANEL
ALT: 30 U/L (ref 0–44)
AST: 43 U/L — ABNORMAL HIGH (ref 15–41)
Albumin: 4 g/dL (ref 3.5–5.0)
Alkaline Phosphatase: 55 U/L (ref 38–126)
Anion gap: 12 (ref 5–15)
BUN: 7 mg/dL — ABNORMAL LOW (ref 8–23)
CO2: 21 mmol/L — AB (ref 22–32)
Calcium: 8.6 mg/dL — ABNORMAL LOW (ref 8.9–10.3)
Chloride: 77 mmol/L — ABNORMAL LOW (ref 98–111)
Creatinine, Ser: 0.55 mg/dL (ref 0.44–1.00)
GFR calc Af Amer: >60 mL/min (ref >60)
GFR calc non Af Amer: >60 mL/min (ref >60)
Glucose, Bld: 114 mg/dL — ABNORMAL HIGH (ref 70–99)
Potassium: 3.3 mmol/L — ABNORMAL LOW (ref 3.5–5.1)
Sodium: 110 mmol/L — CL (ref 135–145)
Total Bilirubin: 1.2 mg/dL (ref 0.3–1.2)
Total Protein: 7.1 g/dL (ref 6.5–8.1)

## 2018-09-03 LAB — CBC WITH DIFFERENTIAL/PLATELET
Abs Immature Granulocytes: 0.05 10*3/uL (ref 0.00–0.07)
Basophils Absolute: 0 10*3/uL (ref 0.0–0.1)
Basophils Relative: 0 %
Eosinophils Absolute: 0.2 10*3/uL (ref 0.0–0.5)
Eosinophils Relative: 2 %
HCT: 40.3 % (ref 36.0–46.0)
Hemoglobin: 14.9 g/dL (ref 12.0–15.0)
Immature Granulocytes: 1 %
Lymphocytes Relative: 22 %
Lymphs Abs: 1.7 10*3/uL (ref 0.7–4.0)
MCH: 30.5 pg (ref 26.0–34.0)
MCHC: 37 g/dL — ABNORMAL HIGH (ref 30.0–36.0)
MCV: 82.4 fL (ref 80.0–100.0)
Monocytes Absolute: 1.3 10*3/uL — ABNORMAL HIGH (ref 0.1–1.0)
Monocytes Relative: 17 %
Neutro Abs: 4.5 10*3/uL (ref 1.7–7.7)
Neutrophils Relative %: 58 %
Platelets: 266 10*3/uL (ref 150–400)
RBC: 4.89 MIL/uL (ref 3.87–5.11)
RDW: 13.2 % (ref 11.5–15.5)
WBC: 7.8 10*3/uL (ref 4.0–10.5)
nRBC: 0 % (ref 0.0–0.2)

## 2018-09-03 LAB — CBC
HCT: 38.9 % (ref 36.0–46.0)
Hemoglobin: 14.6 g/dL (ref 12.0–15.0)
MCH: 30.4 pg (ref 26.0–34.0)
MCHC: 37.5 g/dL — ABNORMAL HIGH (ref 30.0–36.0)
MCV: 80.9 fL (ref 80.0–100.0)
Platelets: 289 10*3/uL (ref 150–400)
RBC: 4.81 MIL/uL (ref 3.87–5.11)
RDW: 13.2 % (ref 11.5–15.5)
WBC: 7.3 10*3/uL (ref 4.0–10.5)
nRBC: 0 % (ref 0.0–0.2)

## 2018-09-03 LAB — OSMOLALITY, URINE
Osmolality, Ur: 297 mOsm/kg — ABNORMAL LOW (ref 300–900)
Osmolality, Ur: 236 mOsm/kg — ABNORMAL LOW (ref 300–900)

## 2018-09-03 LAB — SODIUM, URINE, RANDOM
SODIUM UR: 91 mmol/L
Sodium, Ur: 55 mmol/L

## 2018-09-03 LAB — BASIC METABOLIC PANEL
Anion gap: 10 (ref 5–15)
Anion gap: 9 (ref 5–15)
BUN: 8 mg/dL (ref 8–23)
BUN: 10 mg/dL (ref 8–23)
CALCIUM: 8.9 mg/dL (ref 8.9–10.3)
CO2: 22 mmol/L (ref 22–32)
CO2: 22 mmol/L (ref 22–32)
Calcium: 8.7 mg/dL — ABNORMAL LOW (ref 8.9–10.3)
Chloride: 80 mmol/L — ABNORMAL LOW (ref 98–111)
Chloride: 87 mmol/L — ABNORMAL LOW (ref 98–111)
Creatinine, Ser: 0.58 mg/dL (ref 0.44–1.00)
Creatinine, Ser: 0.84 mg/dL (ref 0.44–1.00)
GFR calc Af Amer: >60 mL/min (ref >60)
GFR calc Af Amer: >60 mL/min (ref >60)
GFR calc non Af Amer: >60 mL/min (ref >60)
Glucose, Bld: 104 mg/dL — ABNORMAL HIGH (ref 70–99)
Glucose, Bld: 107 mg/dL — ABNORMAL HIGH (ref 70–99)
Potassium: 3.8 mmol/L (ref 3.5–5.1)
Potassium: 3.7 mmol/L (ref 3.5–5.1)
Sodium: 112 mmol/L — CL (ref 135–145)
Sodium: 118 mmol/L — CL (ref 135–145)

## 2018-09-03 LAB — SODIUM
SODIUM: 120 mmol/L — AB (ref 135–145)
Sodium: 115 mmol/L — CL (ref 135–145)
Sodium: 116 mmol/L — CL (ref 135–145)

## 2018-09-03 LAB — URIC ACID: Uric Acid, Serum: 2.4 mg/dL — ABNORMAL LOW (ref 2.5–7.1)

## 2018-09-03 LAB — MAGNESIUM
Magnesium: 1.8 mg/dL (ref 1.7–2.4)
Magnesium: 2.2 mg/dL (ref 1.7–2.4)

## 2018-09-03 LAB — CORTISOL: CORTISOL PLASMA: 20.1 ug/dL

## 2018-09-03 LAB — OSMOLALITY
Osmolality: 232 mOsm/kg — CL (ref 275–295)
Osmolality: 244 mOsm/kg — CL (ref 275–295)

## 2018-09-03 LAB — TSH: TSH: 5.89 u[IU]/mL — ABNORMAL HIGH (ref 0.350–4.500)

## 2018-09-03 MED ORDER — PROMETHAZINE HCL 25 MG/ML IJ SOLN
6.2500 mg | Freq: Once | INTRAMUSCULAR | Status: AC
  Administered 2018-09-03: 6.25 mg via INTRAVENOUS
  Filled 2018-09-03: qty 1

## 2018-09-03 MED ORDER — POTASSIUM CHLORIDE 20 MEQ PO PACK
20.0000 meq | PACK | Freq: Once | ORAL | Status: AC
  Administered 2018-09-03: 20 meq via ORAL
  Filled 2018-09-03: qty 1

## 2018-09-03 MED ORDER — SODIUM CHLORIDE 3 % IV SOLN
INTRAVENOUS | Status: DC
  Filled 2018-09-03: qty 500

## 2018-09-03 MED ORDER — MAGNESIUM SULFATE 2 GM/50ML IV SOLN
2.0000 g | Freq: Once | INTRAVENOUS | Status: AC
  Administered 2018-09-03: 2 g via INTRAVENOUS
  Filled 2018-09-03: qty 50

## 2018-09-03 NOTE — Progress Notes (Signed)
[place] [facility] [place] NAME: Janice Perkins    MR#:  [id number]  DATE OF BIRTH:  1933/05/10  SUBJECTIVE:  CHIEF COMPLAINT:   Chief Complaint  Patient presents with  . Dizziness  . Fall   -Continues to feel dizzy and had an episode of nausea this morning -On 3% saline.  Sodium at 112  REVIEW OF SYSTEMS:  Review of Systems  Constitutional: Positive for malaise/fatigue. Negative for chills and fever.  HENT: Negative for congestion, ear discharge, hearing loss and nosebleeds.   Eyes: Negative for blurred vision and double vision.  Respiratory: Negative for cough, shortness of breath and wheezing.   Cardiovascular: Negative for chest pain and palpitations.  Gastrointestinal: Positive for nausea. Negative for abdominal pain, constipation, diarrhea and vomiting.  Genitourinary: Negative for dysuria.  Musculoskeletal: Negative for myalgias.  Neurological: Positive for dizziness. Negative for focal weakness, seizures, weakness and headaches.  Psychiatric/Behavioral: Negative for depression.    DRUG ALLERGIES:   Allergies  Allergen Reactions  . Diphenhydramine Hcl Rash  . Penicillins Hives    .Has patient had a PCN reaction causing immediate rash, facial/tongue/throat swelling, SOB or lightheadedness with hypotension: Unknown Has patient had a PCN reaction causing severe rash involving mucus membranes or skin necrosis: Unknown Has patient had a PCN reaction that required hospitalization: Unknown Has patient had a PCN reaction occurring within the last 10 years: Unknown If all of the above answers are "NO", then may proceed with Cephalosporin use.   . Captopril Other (See Comments)  . Enalapril Maleate     Other reaction(s): Unknown  . Tape Itching  . Terfenadine Other (See Comments)  . Augmentin [Amoxicillin-Pot Clavulanate] Rash    Has patient had a PCN reaction causing immediate rash, facial/tongue/throat swelling, SOB or  lightheadedness with hypotension: Unknown Has patient had a PCN reaction causing severe rash involving mucus membranes or skin necrosis: Unknown Has patient had a PCN reaction that required hospitalization: Unknown Has patient had a PCN reaction occurring within the last 10 years: Unknown If all of the above answers are "NO", then may proceed with Cephalosporin use.  . Biaxin [Clarithromycin] Rash, Other (See Comments) and Hives    Other reaction(s): Unknown    VITALS:  Blood pressure (!) 156/70, pulse (!) 53, temperature 97.8 F (36.6 C), temperature source Oral, resp. rate 14, weight 71.8 kg, SpO2 95 %.  PHYSICAL EXAMINATION:  Physical Exam   GENERAL:  85 y.o.-year-old patient lying in the bed with no acute distress.  EYES: Pupils equal, round, reactive to light and accommodation. No scleral icterus. Extraocular muscles intact.  HEENT: Head atraumatic, normocephalic. Oropharynx and nasopharynx clear.  NECK:  Supple, no jugular venous distention. No thyroid enlargement, no tenderness.  LUNGS: Normal breath sounds bilaterally, no wheezing, rales,rhonchi or crepitation. No use of accessory muscles of respiration.  Decreased bibasilar breath sounds CARDIOVASCULAR: S1, S2 normal. No murmurs, rubs, or gallops.  ABDOMEN: Soft, nontender, nondistended. Bowel sounds present. No organomegaly or mass.  EXTREMITIES: No pedal edema, cyanosis, or clubbing.  NEUROLOGIC: Cranial nerves II through XII are intact. Muscle strength 5/5 in all extremities. Sensation intact. Gait not checked.  PSYCHIATRIC: The patient is alert and oriented x 3.  Intermittent confusion noted SKIN: No obvious rash, lesion, or ulcer.    LABORATORY PANEL:   CBC Recent Labs  Lab 09/03/18 0346  WBC 7.8  HGB 14.9  HCT 40.3  PLT 266   ------------------------------------------------------------------------------------------------------------------  Chemistries  Recent Labs  Lab  09/03/18 0001 09/03/18 0346  NA 110*  112*  K 3.3* 3.8  CL 77* 80*  CO2 21* 22  GLUCOSE 114* 104*  BUN 7* 8  CREATININE 0.55 0.58  CALCIUM 8.6* 8.7*  MG  --  1.8  AST 43*  --   ALT 30  --   ALKPHOS 55  --   BILITOT 1.2  --    ------------------------------------------------------------------------------------------------------------------  Cardiac Enzymes Recent Labs  Lab 09/02/18 0913  TROPONINI <0.03   ------------------------------------------------------------------------------------------------------------------  RADIOLOGY:  Dg Chest 2 View  Result Date: 09/02/2018 CLINICAL DATA:  Follow-up pneumonia. Dizziness. EXAM: CHEST - 2 VIEW COMPARISON:  08/08/2018 FINDINGS: Heart size is normal. Chronic aortic atherosclerosis. Bronchial thickening pattern without consolidation, collapse or effusion. No significant bone finding. Previous left shoulder replacement. IMPRESSION: 1. Bronchitis pattern. No consolidation or collapse. 2. Aortic atherosclerosis. Electronically Signed   By: Nelson Chimes M.D.   On: 09/02/2018 09:57   Ct Head Wo Contrast  Result Date: 09/02/2018 CLINICAL DATA:  Fall, vertigo. EXAM: CT HEAD WITHOUT CONTRAST TECHNIQUE: Contiguous axial images were obtained from the base of the skull through the vertex without intravenous contrast. COMPARISON:  CT scan of June 24, 2018. FINDINGS: Brain: Mild chronic ischemic white matter disease. Stable right basal ganglia lacunar infarction. No mass effect or midline shift is noted. Ventricular size is within normal limits. There is no evidence of mass lesion, hemorrhage or acute infarction. Vascular: No hyperdense vessel or unexpected calcification. Skull: Normal. Negative for fracture or focal lesion. Sinuses/Orbits: No acute finding. Other: None. IMPRESSION: Mild chronic ischemic white matter disease. No acute intracranial abnormality seen. Electronically Signed   By: Marijo Conception, M.D.   On: 09/02/2018 10:04    EKG:   Orders placed or performed during the  hospital encounter of 09/02/18  . EKG 12-Lead  . EKG 12-Lead  . ED EKG  . ED EKG    ASSESSMENT AND PLAN:   85 year old female with past medical history significant for chronic A. Fib not on anticoagulation, hypothyroidism, hypertension and depression presents to hospital secondary to dizziness and fatigue.  1.  Hyponatremia-concern for SIADH -Did not improve with IV normal saline.  Fluids changed to 3% hypertonic saline.  Adjust the doses very slow increase in sodium noted -Fluid restriction ordered. -Patient was recently on chlorthalidone which has been discontinued -Nephrology consult  2.  Hypokalemia-being replaced  3.  Hypertension-on Cardizem, as needed clonidine and IV labetalol as needed  4.  Hypothyroidism-continue Synthroid  5.  Depression and anxiety-on Prozac and BuSpar  6.  DVT prophylaxis-on Lovenox  Ambulates with a cane at baseline.  Once sodium improves greater than 120- consider physical therapy     All the records are reviewed and case discussed with Care Management/Social Workerr. Management plans discussed with the patient, family and they are in agreement.  CODE STATUS: Full code  TOTAL TIME TAKING CARE OF THIS PATIENT: 37 minutes.   POSSIBLE D/C IN 1-2 DAYS, DEPENDING ON CLINICAL CONDITION.   Gladstone Lighter M.D on 09/03/2018 at 7:46 AM  Between 7am to 6pm - Pager - [phone]  After 6pm go to [email or web address] - password [facility]  Office  [phone]  CC: Primary care physician; [name], MD

## 2018-09-03 NOTE — Progress Notes (Signed)
CRITICAL CARE NOTE  CC  follow uphyponatremia SUBJECTIVE . Mild nausea earlier. No vomiting. No headaches or seziures Gets anxious easily. H/o chronic anxiety and panic. Denies CO,SOB, abd pain    SIGNIFICANT EVENTS none    BP (!) 156/70   Pulse (!) 53   Temp 97.8 F (36.6 C) (Oral)   Resp 14   Wt 71.8 kg Comment: outpatient weight on 08/30/18  SpO2 95%   BMI 30.91 kg/m    REVIEW OF SYSTEMS  See subjective  PHYSICAL EXAMINATION:  GENERAL comfortable without resp distress HEAD: Normocephalic, atraumatic.  EYES: Pupils equal, round, reactive to light.  No scleral icterus.  MOUTH: Moist mucosal membrane. NECK: Supple. No thyromegaly. No nodules. No JVD.  PULMONARY: CTA without wheezing CARDIOVASCULAR: S1 and S2. Regular rate and rhythm. No murmurs, rubs, or gallops.  GASTROINTESTINAL: Soft, nontender, -distended. No masses. Positive bowel sounds. No hepatosplenomegaly.  MUSCULOSKELETAL: No swelling, clubbing, or edema.  NEUROLOGIC: awake and alert , appropiate non focal with intact CN. SKIN:intact,warm,dry  INTAKE/OUTPUT  Intake/Output Summary (Last 24 hours) at 09/03/2018 0846 Last data filed at 09/03/2018 0600 Gross per 24 hour  Intake 1187.44 ml  Output 1300 ml  Net -112.56 ml    LABS  CBC Recent Labs  Lab 09/02/18 1429 09/03/18 0001 09/03/18 0346  WBC 7.0 7.3 7.8  HGB 14.1 14.6 14.9  HCT 38.6 38.9 40.3  PLT 268 289 266   Coag's Recent Labs  Lab 09/02/18 0913  APTT 33  INR 0.88   BMET Recent Labs  Lab 09/02/18 0913  09/02/18 1429  09/02/18 2246 09/03/18 0001 09/03/18 0346  NA 111*   < > 111*   < > 110* 110* 112*  K 3.1*  --   --   --  3.6 3.3* 3.8  CL 74*  --   --   --   --  77* 80*  CO2 23  --   --   --   --  21* 22  BUN 10  --   --   --   --  7* 8  CREATININE 0.78  --  0.67  --   --  0.55 0.58  GLUCOSE 121*  --   --   --   --  114* 104*   < > = values in this interval not displayed.   Electrolytes Recent Labs  Lab  09/02/18 0913 09/03/18 0001 09/03/18 0346  CALCIUM 9.2 8.6* 8.7*  MG  --   --  1.8   Sepsis Markers No results for input(s): LATICACIDVEN, PROCALCITON, O2SATVEN in the last 168 hours. ABG No results for input(s): PHART, PCO2ART, PO2ART in the last 168 hours. Liver Enzymes Recent Labs  Lab 09/02/18 0913 09/03/18 0001  AST 44* 43*  ALT 29 30  ALKPHOS 60 55  BILITOT 1.3* 1.2  ALBUMIN 4.2 4.0   Cardiac Enzymes Recent Labs  Lab 09/02/18 0913  TROPONINI <0.03   Glucose Recent Labs  Lab 09/02/18 1140  GLUCAP 140*     Recent Results (from the past 240 hour(s))  MRSA PCR Screening     Status: None   Collection Time: 09/02/18 12:05 PM  Result Value Ref Range Status   MRSA by PCR NEGATIVE NEGATIVE Final    Comment:        The GeneXpert MRSA Assay (FDA approved for NASAL specimens only), is one component of a comprehensive MRSA colonization surveillance program. It is not intended to diagnose MRSA infection nor to guide or monitor treatment  for MRSA infections. Performed at [facility], [address] [place], [place] [address]     MEDICATIONS   Current Facility-Administered Medications:  .  acetaminophen (TYLENOL) tablet 650 mg, 650 mg, Oral, Q6H PRN, 650 mg at 09/02/18 1937 **OR** acetaminophen (TYLENOL) suppository 650 mg, 650 mg, Rectal, Q6H PRN, Gouru, Aruna, MD .  albuterol (PROVENTIL) (2.5 MG/3ML) 0.083% nebulizer solution 2.5 mg, 2.5 mg, Nebulization, Q6H PRN, Gouru, Aruna, MD .  ALPRAZolam Duanne Moron) tablet 0.25 mg, 0.25 mg, Oral, BID PRN, Gouru, Aruna, MD, 0.25 mg at 09/03/18 0039 .  atorvastatin (LIPITOR) tablet 20 mg, 20 mg, Oral, QHS, Gouru, Aruna, MD, 20 mg at 09/02/18 2134 .  azelastine (ASTELIN) 0.1 % nasal spray 1 spray, 1 spray, Each Nare, BID, Gouru, Aruna, MD, 1 spray at 09/02/18 2134 .  busPIRone (BUSPAR) tablet 10 mg, 10 mg, Oral, BID, Gouru, Aruna, MD, 10 mg at 09/02/18 2134 .  cholecalciferol (VITAMIN D) tablet 1,000 Units, 1,000  Units, Oral, Daily, Gouru, Aruna, MD, 1,000 Units at 09/02/18 1243 .  cloNIDine (CATAPRES) tablet 0.1 mg, 0.1 mg, Oral, BID PRN, Gouru, Aruna, MD .  clopidogrel (PLAVIX) tablet 75 mg, 75 mg, Oral, Daily, Gouru, Aruna, MD, 75 mg at 09/02/18 1244 .  diltiazem (CARDIZEM LA) 24 hr tablet 120 mg, 120 mg, Oral, Daily, Gouru, Aruna, MD, 120 mg at 09/02/18 1244 .  enoxaparin (LOVENOX) injection 40 mg, 40 mg, Subcutaneous, Q24H, Gouru, Aruna, MD, 40 mg at 09/02/18 1938 .  ferrous sulfate tablet 325 mg, 325 mg, Oral, Q breakfast, Gouru, Aruna, MD .  FLUoxetine (PROZAC) capsule 40 mg, 40 mg, Oral, Daily, Gouru, Aruna, MD, 40 mg at 09/02/18 1243 .  HYDROcodone-acetaminophen (NORCO/VICODIN) 5-325 MG per tablet 1 tablet, 1 tablet, Oral, BID PRN, Gouru, Aruna, MD, 1 tablet at 09/02/18 1649 .  labetalol (NORMODYNE,TRANDATE) injection 10 mg, 10 mg, Intravenous, Q4H PRN, Gouru, Aruna, MD .  levothyroxine (SYNTHROID, LEVOTHROID) tablet 75 mcg, 75 mcg, Oral, QAC breakfast, Gouru, Aruna, MD .  loratadine (CLARITIN) tablet 10 mg, 10 mg, Oral, Daily, Gouru, Aruna, MD, 10 mg at 09/02/18 1243 .  magnesium oxide (MAG-OX) tablet 400 mg, 400 mg, Oral, BID, Gouru, Aruna, MD, 400 mg at 09/02/18 2134 .  mometasone-formoterol (DULERA) 100-5 MCG/ACT inhaler 2 puff, 2 puff, Inhalation, BID, Gouru, Aruna, MD, 2 puff at 09/02/18 1938 .  ondansetron (ZOFRAN) tablet 4 mg, 4 mg, Oral, Q6H PRN, 4 mg at 09/03/18 0026 **OR** ondansetron (ZOFRAN) injection 4 mg, 4 mg, Intravenous, Q6H PRN, Gouru, Aruna, MD .  pantoprazole (PROTONIX) EC tablet 40 mg, 40 mg, Oral, Daily, Gouru, Aruna, MD, 40 mg at 09/02/18 1244 .  senna (SENOKOT) tablet 8.6 mg, 1 tablet, Oral, Daily PRN, Gouru, Aruna, MD .  sodium chloride (hypertonic) 3 % solution, , Intravenous, Continuous, Last Rate: 30 mL/hr at 09/03/18 0600 **AND** hypertonic 3% sodium chloride pharmacy monitoring, , , Until Discontinued, Blakeney, Dana G, NP .  sodium chloride flush (NS) 0.9 % injection  3 mL, 3 mL, Intravenous, Q12H, Gouru, Aruna, MD, 3 mL at 09/02/18 2134         ASSESSMENT AND PLAN SYNOPSIS   85 y.o. female admitted  with dizziness and severe hyponatremia with uncontrolled HTN   Severe hypotonic Hyponatremia, likely related to diuretics. She rcieved one liter of saline in ER Changed to 3% saline overnight due to no sig change in serum NA with n/s. -Monitor serial sodiums, q4h She remains pretty asymptomatic    HTN with Afib RVR,rate controlled at  present -Continue diltiazem -Labetalol PRN -Clonidine PRN  Anxiety -Xanax PRN  Hypothyroidism -mildly elevated TSH, cont levothyroxine.   Cont other out pt meds.  Full code, lovenox for DVT prophylaxis.  Time spent 35 minutes.  Rosine Door, MD  09/03/2018 8:46 AM [facility]

## 2018-09-03 NOTE — Consult Note (Signed)
[facility] CONSULT NOTE    Date: 09/03/2018                  Patient Name:  Janice Perkins  MRN: [id number]  DOB: Jun 25, 1933  Age / Sex: 85 y.o., female         PCP: [name], MD                 Service Requesting Consult: Hospitalist                 Reason for Consult: Hyponatremia            History of Present Illness: Patient is a 85 y.o. female with a PMHx of asthma, atrial fibrillation, breast cancer, COPD, hypertension, who was admitted to [facility] on 09/02/2018 for evaluation of dizziness and severe hyponatremia.  Patient was noted to be on chlorthalidone at home.  Upon presentation her serum sodium was quite low at 111.  This a.m. she was switched over to 3% saline.  Serum sodium currently is 112.  Potassium was low at 3.1 but now up to 3.8 with repletion.  She has had some mild nausea but no vomiting.  She also denies diarrhea or any other form of volume loss.  TSH was performed and was slightly high at 5.89.  Serum osmolality found to be low at 232.  Patient seen at bedside in the critical care unit.   Medications: Outpatient medications: Medications Prior to Admission  Medication Sig Dispense Refill Last Dose  . albuterol (ACCUNEB) 1.25 MG/3ML nebulizer solution Inhale 3 mLs into the lungs every 6 (six) hours as needed for wheezing.    Unknown at PRN  . albuterol (PROVENTIL HFA;VENTOLIN HFA) 108 (90 Base) MCG/ACT inhaler Inhale 1-2 puffs into the lungs every 6 (six) hours as needed for wheezing or shortness of breath.   Unknown at PRN  . atorvastatin (LIPITOR) 20 MG tablet Take 20 mg by mouth at bedtime.    09/01/2018 at 2000  . azelastine (ASTELIN) 0.1 % nasal spray Place 1 spray into both nostrils 2 (two) times daily.   09/01/2018 at Unknown time  . busPIRone (BUSPAR) 10 MG tablet Take 10 mg by mouth 2 (two) times daily.   09/01/2018 at 1900  . chlorthalidone (HYGROTON) 25 MG tablet Take 25 mg by mouth daily.   09/01/2018 at Unknown time  .  cholecalciferol (VITAMIN D) 1000 units tablet Take 1,000 Units by mouth daily.   09/01/2018 at 0800  . cloNIDine (CATAPRES) 0.1 MG tablet Take 0.1 mg by mouth 2 (two) times daily as needed (SBP >170).   09/02/2018 at PRN  . clopidogrel (PLAVIX) 75 MG tablet Take 75 mg by mouth daily.   09/01/2018 at 0800  . diazepam (VALIUM) 5 MG tablet Take 5 mg by mouth daily as needed for anxiety.   Unknown at PRN  . diltiazem (DILACOR XR) 120 MG 24 hr capsule Take 120 mg by mouth daily.   09/01/2018 at 0800  . esomeprazole (NEXIUM) 40 MG capsule Take 40 mg by mouth 2 (two) times daily before a meal.    09/01/2018 at 1700  . ferrous sulfate 325 (65 FE) MG tablet Take 325 mg by mouth daily with breakfast.   09/01/2018 at 0800  . FLUoxetine (PROZAC) 40 MG capsule Take 40 mg by mouth daily.    09/01/2018 at 0800  . hydrALAZINE (APRESOLINE) 50 MG tablet Take 50 mg by mouth 3 (three) times daily.   09/01/2018 at  1900  . HYDROcodone-acetaminophen (NORCO/VICODIN) 5-325 MG tablet Take 1 tablet by mouth 2 (two) times daily as needed for moderate pain or severe pain.    Unknown at PRN  . levothyroxine (SYNTHROID, LEVOTHROID) 75 MCG tablet Take 1 tablet (75 mcg total) by mouth daily before breakfast.   09/01/2018 at 0700  . loratadine (CLARITIN) 10 MG tablet Take 10 mg by mouth daily.   09/01/2018 at 0800  . magnesium oxide (MAG-OX) 400 MG tablet Take 400 mg by mouth 2 (two) times daily.   09/01/2018 at 1900  . meloxicam (MOBIC) 7.5 MG tablet Take 7.5 mg by mouth daily.   09/01/2018 at 0800  . mometasone-formoterol (DULERA) 100-5 MCG/ACT AERO Inhale 2 puffs into the lungs 2 (two) times daily.   09/01/2018 at 1800  . senna (SENOKOT) 8.6 MG TABS tablet Take 1 tablet by mouth daily as needed for mild constipation.   Unknown at PRN  . cloNIDine (CATAPRES) 0.1 MG tablet Take 1 tablet (0.1 mg total) by mouth 2 (two) times daily as needed (Take as needed for systolic blood pressure (top number) over 170.  Hold if diastolic (bottom number) is  below 70.). (Patient not taking: Reported on 03/24/2018) 10 tablet 0 Not Taking at Unknown time  . hydrALAZINE (APRESOLINE) 25 MG tablet Take 1 tablet (25 mg total) by mouth every 6 (six) hours. (Patient not taking: Reported on 09/02/2018)   Not Taking at Unknown time    Current medications: Current Facility-Administered Medications  Medication Dose Route Frequency Provider Last Rate Last Dose  . acetaminophen (TYLENOL) tablet 650 mg  650 mg Oral Q6H PRN Gouru, Aruna, MD   650 mg at 09/02/18 1937   Or  . acetaminophen (TYLENOL) suppository 650 mg  650 mg Rectal Q6H PRN Gouru, Aruna, MD      . albuterol (PROVENTIL) (2.5 MG/3ML) 0.083% nebulizer solution 2.5 mg  2.5 mg Nebulization Q6H PRN Gouru, Aruna, MD      . ALPRAZolam Duanne Moron) tablet 0.25 mg  0.25 mg Oral BID PRN Gouru, Aruna, MD   0.25 mg at 09/03/18 0039  . atorvastatin (LIPITOR) tablet 20 mg  20 mg Oral QHS Gouru, Aruna, MD   20 mg at 09/02/18 2134  . azelastine (ASTELIN) 0.1 % nasal spray 1 spray  1 spray Each Nare BID Nicholes Mango, MD   1 spray at 09/03/18 0858  . busPIRone (BUSPAR) tablet 10 mg  10 mg Oral BID Gouru, Aruna, MD   10 mg at 09/03/18 0900  . cholecalciferol (VITAMIN D) tablet 1,000 Units  1,000 Units Oral Daily Gouru, Aruna, MD   1,000 Units at 09/03/18 0900  . cloNIDine (CATAPRES) tablet 0.1 mg  0.1 mg Oral BID PRN Gouru, Aruna, MD      . clopidogrel (PLAVIX) tablet 75 mg  75 mg Oral Daily Gouru, Aruna, MD   75 mg at 09/03/18 0900  . diltiazem (CARDIZEM LA) 24 hr tablet 120 mg  120 mg Oral Daily Gouru, Aruna, MD   120 mg at 09/03/18 0900  . enoxaparin (LOVENOX) injection 40 mg  40 mg Subcutaneous Q24H Gouru, Aruna, MD   40 mg at 09/02/18 1938  . ferrous sulfate tablet 325 mg  325 mg Oral Q breakfast Gouru, Aruna, MD   325 mg at 09/03/18 0900  . FLUoxetine (PROZAC) capsule 40 mg  40 mg Oral Daily Gouru, Aruna, MD   40 mg at 09/03/18 0900  . HYDROcodone-acetaminophen (NORCO/VICODIN) 5-325 MG per tablet 1 tablet  1 tablet Oral  BID PRN Nicholes Mango, MD   1 tablet at 09/02/18 1649  . labetalol (NORMODYNE,TRANDATE) injection 10 mg  10 mg Intravenous Q4H PRN Gouru, Aruna, MD      . levothyroxine (SYNTHROID, LEVOTHROID) tablet 75 mcg  75 mcg Oral QAC breakfast Gouru, Aruna, MD   75 mcg at 09/03/18 0856  . loratadine (CLARITIN) tablet 10 mg  10 mg Oral Daily Gouru, Aruna, MD   10 mg at 09/03/18 0902  . magnesium oxide (MAG-OX) tablet 400 mg  400 mg Oral BID Gouru, Aruna, MD   400 mg at 09/03/18 0900  . mometasone-formoterol (DULERA) 100-5 MCG/ACT inhaler 2 puff  2 puff Inhalation BID Nicholes Mango, MD   2 puff at 09/03/18 0854  . ondansetron (ZOFRAN) tablet 4 mg  4 mg Oral Q6H PRN Gouru, Aruna, MD   4 mg at 09/03/18 0026   Or  . ondansetron (ZOFRAN) injection 4 mg  4 mg Intravenous Q6H PRN Gouru, Aruna, MD      . pantoprazole (PROTONIX) EC tablet 40 mg  40 mg Oral Daily Gouru, Aruna, MD   40 mg at 09/03/18 0900  . senna (SENOKOT) tablet 8.6 mg  1 tablet Oral Daily PRN Gouru, Aruna, MD      . sodium chloride (hypertonic) 3 % solution   Intravenous Continuous Awilda Bill, NP 30 mL/hr at 09/03/18 0911    . sodium chloride flush (NS) 0.9 % injection 3 mL  3 mL Intravenous Q12H Gouru, Illene Silver, MD   3 mL at 09/03/18 0903      Allergies: Allergies  Allergen Reactions  . Diphenhydramine Hcl Rash  . Penicillins Hives    .Has patient had a PCN reaction causing immediate rash, facial/tongue/throat swelling, SOB or lightheadedness with hypotension: Unknown Has patient had a PCN reaction causing severe rash involving mucus membranes or skin necrosis: Unknown Has patient had a PCN reaction that required hospitalization: Unknown Has patient had a PCN reaction occurring within the last 10 years: Unknown If all of the above answers are "NO", then may proceed with Cephalosporin use.   . Captopril Other (See Comments)  . Enalapril Maleate     Other reaction(s): Unknown  . Tape Itching  . Terfenadine Other (See Comments)  .  Augmentin [Amoxicillin-Pot Clavulanate] Rash    Has patient had a PCN reaction causing immediate rash, facial/tongue/throat swelling, SOB or lightheadedness with hypotension: Unknown Has patient had a PCN reaction causing severe rash involving mucus membranes or skin necrosis: Unknown Has patient had a PCN reaction that required hospitalization: Unknown Has patient had a PCN reaction occurring within the last 10 years: Unknown If all of the above answers are "NO", then may proceed with Cephalosporin use.  . Biaxin [Clarithromycin] Rash, Other (See Comments) and Hives    Other reaction(s): Unknown      Past Medical History: Past Medical History:  Diagnosis Date  . Asthma   . Atrial fibrillation ([place])   . Breast cancer ([place]) 2011  . Bronchitis 04/2015  . Cancer [facility]) 2011   breast  . COPD (chronic obstructive pulmonary disease) ([place])   . Hypertension   . Shingles    10/2015     Past Surgical History: Past Surgical History:  Procedure Laterality Date  . BREAST EXCISIONAL BIOPSY Right 11/29/13   two areas FAT NECROSIS  . BREAST EXCISIONAL BIOPSY Right 10/30/2009   lumpectomy - radiation  . COLONOSCOPY WITH PROPOFOL N/A 06/10/2018   Procedure: COLONOSCOPY WITH PROPOFOL;  Surgeon: Manya Silvas, MD;  Location: [facility] ENDOSCOPY;  Service: Endoscopy;  Laterality: N/A;  . ESOPHAGOGASTRODUODENOSCOPY (EGD) WITH PROPOFOL N/A 06/10/2018   Procedure: ESOPHAGOGASTRODUODENOSCOPY (EGD) WITH PROPOFOL;  Surgeon: Manya Silvas, MD;  Location[facility] ENDOSCOPY;  Service: Endoscopy;  Laterality: N/A;  . NASAL SINUS SURGERY       Family History: Family History  Problem Relation Age of Onset  . Hypertension Mother   . Heart disease Mother   . Cancer Mother   . Stroke Father   . Hypertension Father   . Breast cancer Sister      Social History: Social History   Socioeconomic History  . Marital status: Married    Spouse name: Not on file  . Number of children: Not on file  . Years  of education: Not on file  . Highest education level: Not on file  Occupational History  . Not on file  Social Needs  . Financial resource strain: Not on file  . Food insecurity:    Worry: Not on file    Inability: Not on file  . Transportation needs:    Medical: Not on file    Non-medical: Not on file  Tobacco Use  . Smoking status: Never Smoker  . Smokeless tobacco: Never Used  Substance and Sexual Activity  . Alcohol use: No    Alcohol/week: 0.0 standard drinks  . Drug use: No  . Sexual activity: Not on file  Lifestyle  . Physical activity:    Days per week: Not on file    Minutes per session: Not on file  . Stress: Not on file  Relationships  . Social connections:    Talks on phone: Not on file    Gets together: Not on file    Attends religious service: Not on file    Active member of club or organization: Not on file    Attends meetings of clubs or organizations: Not on file    Relationship status: Not on file  . Intimate partner violence:    Fear of current or ex partner: Not on file    Emotionally abused: Not on file    Physically abused: Not on file    Forced sexual activity: Not on file  Other Topics Concern  . Not on file  Social History Narrative  . Not on file     Review of Systems: Review of Systems  Constitutional: Positive for malaise/fatigue. Negative for chills and fever.  HENT: Negative for congestion, hearing loss and tinnitus.   Eyes: Negative for blurred vision and double vision.  Respiratory: Negative for cough, hemoptysis and sputum production.   Cardiovascular: Negative for chest pain, palpitations and orthopnea.  Gastrointestinal: Positive for nausea. Negative for diarrhea, heartburn and vomiting.  Genitourinary: Negative for dysuria, frequency and urgency.  Musculoskeletal: Negative for joint pain and myalgias.  Skin: Negative for rash.  Neurological: Positive for dizziness and weakness.  Endo/Heme/Allergies: Negative for polydipsia.  Does not bruise/bleed easily.  Psychiatric/Behavioral: The patient is nervous/anxious.      Vital Signs: Blood pressure 111/60, pulse 63, temperature 98.1 F (36.7 C), temperature source Oral, resp. rate 17, weight 71.8 kg, SpO2 99 %.  Weight trends: Filed Weights   09/02/18 1100  Weight: 71.8 kg    Physical Exam: General: NAD,   Head: Normocephalic, atraumatic.  Eyes: Anicteric, EOMI  Nose: Mucous membranes moist, not inflammed, nonerythematous.  Throat: Oropharynx nonerythematous, no exudate appreciated.   Neck: Supple, trachea midline.  Lungs:  Normal respiratory effort. Clear to auscultation BL without crackles  or wheezes.  Heart: RRR. S1 and S2 normal without gallop, murmur, or rubs.  Abdomen:  BS normoactive. Soft, Nondistended, non-tender.  No masses or organomegaly.  Extremities: No pretibial edema.  Neurologic: A&O X3, Motor strength is 5/5 in the all 4 extremities  Skin: No visible rashes, scars.    Lab results: Basic Metabolic Panel: Recent Labs  Lab 09/02/18 0913  09/02/18 1429  09/02/18 2246 09/03/18 0001 09/03/18 0346  NA 111*   < > 111*   < > 110* 110* 112*  K 3.1*  --   --   --  3.6 3.3* 3.8  CL 74*  --   --   --   --  77* 80*  CO2 23  --   --   --   --  21* 22  GLUCOSE 121*  --   --   --   --  114* 104*  BUN 10  --   --   --   --  7* 8  CREATININE 0.78  --  0.67  --   --  0.55 0.58  CALCIUM 9.2  --   --   --   --  8.6* 8.7*  MG  --   --   --   --   --   --  1.8   < > = values in this interval not displayed.    Liver Function Tests: Recent Labs  Lab 09/02/18 0913 09/03/18 0001  AST 44* 43*  ALT 29 30  ALKPHOS 60 55  BILITOT 1.3* 1.2  PROT 7.5 7.1  ALBUMIN 4.2 4.0   No results for input(s): LIPASE, AMYLASE in the last 168 hours. No results for input(s): AMMONIA in the last 168 hours.  CBC: Recent Labs  Lab 09/02/18 0913 09/02/18 1429 09/03/18 0001 09/03/18 0346  WBC 7.2 7.0 7.3 7.8  NEUTROABS 4.7  --   --  4.5  HGB 14.1 14.1  14.6 14.9  HCT 37.6 38.6 38.9 40.3  MCV 81.4 82.7 80.9 82.4  PLT 262 268 289 266    Cardiac Enzymes: Recent Labs  Lab 09/02/18 0913  TROPONINI <0.03    BNP: Invalid input(s): POCBNP  CBG: Recent Labs  Lab 09/02/18 1140  GLUCAP 140*    Microbiology: Results for orders placed or performed during the hospital encounter of 09/02/18  MRSA PCR Screening     Status: None   Collection Time: 09/02/18 12:05 PM  Result Value Ref Range Status   MRSA by PCR NEGATIVE NEGATIVE Final    Comment:        The GeneXpert MRSA Assay (FDA approved for NASAL specimens only), is one component of a comprehensive MRSA colonization surveillance program. It is not intended to diagnose MRSA infection nor to guide or monitor treatment for MRSA infections. Performed at [facility], [address] [place], [place] [address]     Coagulation Studies: Recent Labs    09/02/18 0913  LABPROT 11.9  INR 0.88    Urinalysis: Recent Labs    09/02/18 0936  COLORURINE STRAW*  LABSPEC 1.006  PHURINE 8.0  GLUCOSEU NEGATIVE  HGBUR NEGATIVE  BILIRUBINUR NEGATIVE  KETONESUR 5*  PROTEINUR 100*  NITRITE NEGATIVE  LEUKOCYTESUR NEGATIVE      Imaging: Dg Chest 2 View  Result Date: 09/02/2018 CLINICAL DATA:  Follow-up pneumonia. Dizziness. EXAM: CHEST - 2 VIEW COMPARISON:  08/08/2018 FINDINGS: Heart size is normal. Chronic aortic atherosclerosis. Bronchial thickening pattern without consolidation, collapse or effusion. No significant bone finding.  Previous left shoulder replacement. IMPRESSION: 1. Bronchitis pattern. No consolidation or collapse. 2. Aortic atherosclerosis. Electronically Signed   By: Nelson Chimes M.D.   On: 09/02/2018 09:57   Ct Head Wo Contrast  Result Date: 09/02/2018 CLINICAL DATA:  Fall, vertigo. EXAM: CT HEAD WITHOUT CONTRAST TECHNIQUE: Contiguous axial images were obtained from the base of the skull through the vertex without intravenous contrast. COMPARISON:  CT  scan of June 24, 2018. FINDINGS: Brain: Mild chronic ischemic white matter disease. Stable right basal ganglia lacunar infarction. No mass effect or midline shift is noted. Ventricular size is within normal limits. There is no evidence of mass lesion, hemorrhage or acute infarction. Vascular: No hyperdense vessel or unexpected calcification. Skull: Normal. Negative for fracture or focal lesion. Sinuses/Orbits: No acute finding. Other: None. IMPRESSION: Mild chronic ischemic white matter disease. No acute intracranial abnormality seen. Electronically Signed   By: Marijo Conception, M.D.   On: 09/02/2018 10:04      Assessment & Plan: Pt is a 85 y.o. female ith a PMHx of asthma, atrial fibrillation, breast cancer, COPD, hypertension, who was admitted to [facility] on 09/02/2018 for evaluation of dizziness and severe hyponatremia.    1.  Severe hyponatremia.  Patient was on chlorthalidone at home. 2.  Hypokalemia, improved. 3.  Hypertension.  Plan: Patient admitted with severe hyponatremia.  Serum sodium has been as low as 110.  Patient now switched over to 3% saline at 30 cc/h.  Recommend frequent monitoring of serum sodium.  Serum sodium target in the next 24 hours is 120.  Serum sodiums have been ordered for every 4 hours.  Agree with efforts at potassium repletion.  We will also recheck serum osmolality, urine osmolality, cortisol, and uric acid.  Further plan as patient progresses.

## 2018-09-03 NOTE — Progress Notes (Signed)
MEDICATION RELATED CONSULT NOTE -  Pharmacy Consult for hypertonic saline (3% saline) Indication: severe hyponatremia possibly s/t chlorthalidone txt  Allergies  Allergen Reactions  . Diphenhydramine Hcl Rash  . Penicillins Hives    .Has patient had a PCN reaction causing immediate rash, facial/tongue/throat swelling, SOB or lightheadedness with hypotension: Unknown Has patient had a PCN reaction causing severe rash involving mucus membranes or skin necrosis: Unknown Has patient had a PCN reaction that required hospitalization: Unknown Has patient had a PCN reaction occurring within the last 10 years: Unknown If all of the above answers are "NO", then may proceed with Cephalosporin use.   . Captopril Other (See Comments)  . Enalapril Maleate     Other reaction(s): Unknown  . Tape Itching  . Terfenadine Other (See Comments)  . Augmentin [Amoxicillin-Pot Clavulanate] Rash    Has patient had a PCN reaction causing immediate rash, facial/tongue/throat swelling, SOB or lightheadedness with hypotension: Unknown Has patient had a PCN reaction causing severe rash involving mucus membranes or skin necrosis: Unknown Has patient had a PCN reaction that required hospitalization: Unknown Has patient had a PCN reaction occurring within the last 10 years: Unknown If all of the above answers are "NO", then may proceed with Cephalosporin use.  . Biaxin [Clarithromycin] Rash, Other (See Comments) and Hives    Other reaction(s): Unknown    Patient Measurements: Weight: 158 lb 4.6 oz (71.8 kg)(outpatient weight on 08/30/18) Adjusted Body Weight: 71.8 kg  Vital Signs: Temp: 98.1 F (36.7 C) (01/18 0800) Temp Source: Oral (01/18 0800) BP: 111/60 (01/18 0900) Pulse Rate: 63 (01/18 0900) Intake/Output from previous day: 01/17 0701 - 01/18 0700 In: 1187.4 [P.O.:477; I.V.:559.7; IV Piggyback:150.7] Out: 1300 [Urine:1300] Intake/Output from this shift: Total I/O In: 698.5 [P.O.:600;  I.V.:98.5] Out: 150 [Urine:150]  Labs: Recent Labs    09/02/18 0913 09/02/18 1429 09/03/18 0001 09/03/18 0346  WBC 7.2 7.0 7.3 7.8  HGB 14.1 14.1 14.6 14.9  HCT 37.6 38.6 38.9 40.3  PLT 262 268 289 266  APTT 33  --   --   --   CREATININE 0.78 0.67 0.55 0.58  MG  --   --   --  1.8  ALBUMIN 4.2  --  4.0  --   PROT 7.5  --  7.1  --   AST 44*  --  43*  --   ALT 29  --  30  --   ALKPHOS 60  --  55  --   BILITOT 1.3*  --  1.2  --    Estimated Creatinine Clearance: 47.1 mL/min (by C-G formula based on SCr of 0.58 mg/dL).   Microbiology: Recent Results (from the past 720 hour(s))  MRSA PCR Screening     Status: None   Collection Time: 09/02/18 12:05 PM  Result Value Ref Range Status   MRSA by PCR NEGATIVE NEGATIVE Final    Comment:        The GeneXpert MRSA Assay (FDA approved for NASAL specimens only), is one component of a comprehensive MRSA colonization surveillance program. It is not intended to diagnose MRSA infection nor to guide or monitor treatment for MRSA infections. Performed at [facility], [address] [place], [place] [address]     Medical History: Past Medical History:  Diagnosis Date  . Asthma   . Atrial fibrillation ([place])   . Breast cancer ([place]) 2011  . Bronchitis 04/2015  . Cancer [facility]) 2011   breast  . COPD (chronic obstructive pulmonary disease) ([place])   .  Hypertension   . Shingles    10/2015    Medications:  Scheduled:  . atorvastatin  20 mg Oral QHS  . azelastine  1 spray Each Nare BID  . busPIRone  10 mg Oral BID  . cholecalciferol  1,000 Units Oral Daily  . clopidogrel  75 mg Oral Daily  . diltiazem  120 mg Oral Daily  . enoxaparin (LOVENOX) injection  40 mg Subcutaneous Q24H  . ferrous sulfate  325 mg Oral Q breakfast  . FLUoxetine  40 mg Oral Daily  . levothyroxine  75 mcg Oral QAC breakfast  . loratadine  10 mg Oral Daily  . magnesium oxide  400 mg Oral BID  . mometasone-formoterol  2 puff Inhalation BID  .  pantoprazole  40 mg Oral Daily  . sodium chloride flush  3 mL Intravenous Q12H    Assessment: Patient arrives w/ C/o dizziness and fall w/ h/o of afib, HTN and was started on chlorthalidone by cardiology. Dizziness started 10 days ago but progressively worsening. On initial labs patient presented w/ a Na of 111. CT head: no acute intracranial abnormality.  01/17: 0900 111 1130 110 1430 111 1900 112 2300 110   01/18: 0000 110 0400 112 1030 115   Goal of Therapy:  Na: 119 - 120 mEq/L @ 24 hours. Goal range: 135 - 145; to not correct more than 8 - 10 mEq/L in a 24 hour period as to prevent central pontine myelinosis.  Plan:  Patient initially started on NS thought to be hypovolemic hyponatremia; but Na was not improving therefore was placed on 3% saline. Renal has been consulted. Patient is currently receiving 3% saline @ 30 ml/hr w/ Na trending up appropriately. Will continue to monitor- q4h Na labs ordered.  Chinita Greenland PharmD Clinical Pharmacist 09/03/2018

## 2018-09-03 NOTE — Progress Notes (Signed)
MEDICATION RELATED CONSULT NOTE -  Pharmacy Consult for hypertonic saline (3% saline) Indication: severe hyponatremia possibly s/t chlorthalidone txt  Allergies  Allergen Reactions  . Diphenhydramine Hcl Rash  . Penicillins Hives    .Has patient had a PCN reaction causing immediate rash, facial/tongue/throat swelling, SOB or lightheadedness with hypotension: Unknown Has patient had a PCN reaction causing severe rash involving mucus membranes or skin necrosis: Unknown Has patient had a PCN reaction that required hospitalization: Unknown Has patient had a PCN reaction occurring within the last 10 years: Unknown If all of the above answers are "NO", then may proceed with Cephalosporin use.   . Captopril Other (See Comments)  . Enalapril Maleate     Other reaction(s): Unknown  . Tape Itching  . Terfenadine Other (See Comments)  . Augmentin [Amoxicillin-Pot Clavulanate] Rash    Has patient had a PCN reaction causing immediate rash, facial/tongue/throat swelling, SOB or lightheadedness with hypotension: Unknown Has patient had a PCN reaction causing severe rash involving mucus membranes or skin necrosis: Unknown Has patient had a PCN reaction that required hospitalization: Unknown Has patient had a PCN reaction occurring within the last 10 years: Unknown If all of the above answers are "NO", then may proceed with Cephalosporin use.  . Biaxin [Clarithromycin] Rash, Other (See Comments) and Hives    Other reaction(s): Unknown    Patient Measurements: Weight: 158 lb 4.6 oz (71.8 kg)(outpatient weight on 08/30/18) Adjusted Body Weight: 71.8 kg  Vital Signs: Temp: 98.5 F (36.9 C) (01/18 1400) Temp Source: Oral (01/18 1400) BP: 137/77 (01/18 1400) Pulse Rate: 65 (01/18 1400) Intake/Output from previous day: 01/17 0701 - 01/18 0700 In: 1187.4 [P.O.:477; I.V.:559.7; IV Piggyback:150.7] Out: 1300 [Urine:1300] Intake/Output from this shift: Total I/O In: 774.9 [P.O.:600;  I.V.:174.9] Out: [place]: Recent Labs    09/02/18 0913 09/02/18 1429 09/03/18 0001 09/03/18 0346  WBC 7.2 7.0 7.3 7.8  HGB 14.1 14.1 14.6 14.9  HCT 37.6 38.6 38.9 40.3  PLT 262 268 289 266  APTT 33  --   --   --   CREATININE 0.78 0.67 0.55 0.58  MG  --   --   --  1.8  ALBUMIN 4.2  --  4.0  --   PROT 7.5  --  7.1  --   AST 44*  --  43*  --   ALT 29  --  30  --   ALKPHOS 60  --  55  --   BILITOT 1.3*  --  1.2  --    Estimated Creatinine Clearance: 47.1 mL/min (by C-G formula based on SCr of 0.58 mg/dL).   Microbiology: Recent Results (from the past 720 hour(s))  MRSA PCR Screening     Status: None   Collection Time: 09/02/18 12:05 PM  Result Value Ref Range Status   MRSA by PCR NEGATIVE NEGATIVE Final    Comment:        The GeneXpert MRSA Assay (FDA approved for NASAL specimens only), is one component of a comprehensive MRSA colonization surveillance program. It is not intended to diagnose MRSA infection nor to guide or monitor treatment for MRSA infections. Performed at [facility], [address], [place], [place] [address]     Medical History: Past Medical History:  Diagnosis Date  . Asthma   . Atrial fibrillation ([place])   . Breast cancer ([place]) 2011  . Bronchitis 04/2015  . Cancer [facility]) 2011   breast  . COPD (chronic obstructive pulmonary disease) ([place])   .  Hypertension   . Shingles    10/2015    Medications:  Scheduled:  . atorvastatin  20 mg Oral QHS  . azelastine  1 spray Each Nare BID  . busPIRone  10 mg Oral BID  . cholecalciferol  1,000 Units Oral Daily  . clopidogrel  75 mg Oral Daily  . diltiazem  120 mg Oral Daily  . enoxaparin (LOVENOX) injection  40 mg Subcutaneous Q24H  . ferrous sulfate  325 mg Oral Q breakfast  . FLUoxetine  40 mg Oral Daily  . levothyroxine  75 mcg Oral QAC breakfast  . loratadine  10 mg Oral Daily  . magnesium oxide  400 mg Oral BID  . mometasone-formoterol  2 puff Inhalation BID  .  pantoprazole  40 mg Oral Daily  . sodium chloride flush  3 mL Intravenous Q12H    Assessment: Patient arrives w/ C/o dizziness and fall w/ h/o of afib, HTN and was started on chlorthalidone by cardiology. Dizziness started 10 days ago but progressively worsening. On initial labs patient presented w/ a Na of 111. CT head: no acute intracranial abnormality.  01/17: 0900 111 1130 110 1430 111 1900 112 2300 110   01/18: 0000 110 0400 112 1030 115 1417 116    Goal of Therapy:  Na: 119 - 120 mEq/L @ 24 hours. Goal range: 135 - 145; to not correct more than 8 - 10 mEq/L in a 24 hour period as to prevent central pontine myelinosis.  Plan:  Patient initially started on NS thought to be hypovolemic hyponatremia; but Na was not improving therefore was placed on 3% saline. Renal has been consulted. Patient is currently receiving 3% saline @ 20 ml/hr w/ Na trending up appropriately. Will continue to monitor- q4h Na labs ordered.  Chinita Greenland PharmD Clinical Pharmacist 09/03/2018

## 2018-09-03 NOTE — Progress Notes (Signed)
MEDICATION RELATED CONSULT NOTE - INITIAL   Pharmacy Consult for hypertonic saline (3% saline) Indication: severe hyponatremia possibly s/t chlorthalidone txt  Allergies  Allergen Reactions  . Diphenhydramine Hcl Rash  . Penicillins Hives    .Has patient had a PCN reaction causing immediate rash, facial/tongue/throat swelling, SOB or lightheadedness with hypotension: Unknown Has patient had a PCN reaction causing severe rash involving mucus membranes or skin necrosis: Unknown Has patient had a PCN reaction that required hospitalization: Unknown Has patient had a PCN reaction occurring within the last 10 years: Unknown If all of the above answers are "NO", then may proceed with Cephalosporin use.   . Captopril Other (See Comments)  . Enalapril Maleate     Other reaction(s): Unknown  . Tape Itching  . Terfenadine Other (See Comments)  . Augmentin [Amoxicillin-Pot Clavulanate] Rash    Has patient had a PCN reaction causing immediate rash, facial/tongue/throat swelling, SOB or lightheadedness with hypotension: Unknown Has patient had a PCN reaction causing severe rash involving mucus membranes or skin necrosis: Unknown Has patient had a PCN reaction that required hospitalization: Unknown Has patient had a PCN reaction occurring within the last 10 years: Unknown If all of the above answers are "NO", then may proceed with Cephalosporin use.  . Biaxin [Clarithromycin] Rash, Other (See Comments) and Hives    Other reaction(s): Unknown    Patient Measurements: Weight: 158 lb 4.6 oz (71.8 kg)(outpatient weight on 08/30/18) Adjusted Body Weight: 71.8 kg  Vital Signs: Temp: 97.8 F (36.6 C) (01/18 0400) Temp Source: Oral (01/18 0400) BP: 156/70 (01/18 0400) Pulse Rate: 54 (01/18 0400) Intake/Output from previous day: 01/17 0701 - 01/18 0700 In: 1081.7 [P.O.:477; I.V.:499.7; IV Piggyback:105] Out: 1300 [Urine:1300] Intake/Output from this shift: Total I/O In: 615.1 [P.O.:237;  I.V.:273.1; IV Piggyback:105] Out: 250 [Urine:250]  Labs: Recent Labs    09/02/18 0913 09/02/18 1429 09/03/18 0001 09/03/18 0346  WBC 7.2 7.0 7.3 7.8  HGB 14.1 14.1 14.6 14.9  HCT 37.6 38.6 38.9 40.3  PLT 262 268 289 266  APTT 33  --   --   --   CREATININE 0.78 0.67 0.55 0.58  MG  --   --   --  1.8  ALBUMIN 4.2  --  4.0  --   PROT 7.5  --  7.1  --   AST 44*  --  43*  --   ALT 29  --  30  --   ALKPHOS 60  --  55  --   BILITOT 1.3*  --  1.2  --    Estimated Creatinine Clearance: 47.1 mL/min (by C-G formula based on SCr of 0.58 mg/dL).   Microbiology: Recent Results (from the past 720 hour(s))  MRSA PCR Screening     Status: None   Collection Time: 09/02/18 12:05 PM  Result Value Ref Range Status   MRSA by PCR NEGATIVE NEGATIVE Final    Comment:        The GeneXpert MRSA Assay (FDA approved for NASAL specimens only), is one component of a comprehensive MRSA colonization surveillance program. It is not intended to diagnose MRSA infection nor to guide or monitor treatment for MRSA infections. Performed at [facility], [address] [place], [place] [address]     Medical History: Past Medical History:  Diagnosis Date  . Asthma   . Atrial fibrillation ([place])   . Breast cancer ([place]) 2011  . Bronchitis 04/2015  . Cancer [facility]) 2011   breast  . COPD (chronic  obstructive pulmonary disease) ([place])   . Hypertension   . Shingles    10/2015    Medications:  Scheduled:  . atorvastatin  20 mg Oral QHS  . azelastine  1 spray Each Nare BID  . busPIRone  10 mg Oral BID  . cholecalciferol  1,000 Units Oral Daily  . clopidogrel  75 mg Oral Daily  . diltiazem  120 mg Oral Daily  . enoxaparin (LOVENOX) injection  40 mg Subcutaneous Q24H  . ferrous sulfate  325 mg Oral Q breakfast  . FLUoxetine  40 mg Oral Daily  . levothyroxine  75 mcg Oral QAC breakfast  . loratadine  10 mg Oral Daily  . magnesium oxide  400 mg Oral BID  . mometasone-formoterol  2 puff  Inhalation BID  . pantoprazole  40 mg Oral Daily  . sodium chloride flush  3 mL Intravenous Q12H    Assessment: Patient arrives w/ C/o dizziness and fall w/ h/o of afib, HTN and was started on chlorthalidone by cardiology. Dizziness started 10 days ago but progressively worsening. On initial labs patient presented w/ a Na of 111. CT head: no acute intracranial abnormality.  01/17: 0900 111 1130 110 1430 111 1900 112 2300 110   01/18: 0000 110 0400 112   Goal of Therapy:  Na: 119 - 120 mEq/L @ 24 hours. Goal range: 135 - 145; to not correct more than 8 - 10 mEq/L in a 24 hour period as to prevent central pontine myelinosis.  Plan:  Patient initially started on NS thought to be hypovolemic hyponatremia; but Na was not improving therefore was placed on 3% saline. Renal has been consulted. Patient is currently received 3% saline @ 30 ml/hr w/ Na trending up appropriately. Will continue to monitor w/ q4h labs.  Tobie Lords, PharmD, BCPS Clinical Pharmacist 09/03/2018

## 2018-09-04 LAB — SODIUM
SODIUM: 123 mmol/L — AB (ref 135–145)
Sodium: 121 mmol/L — ABNORMAL LOW (ref 135–145)
Sodium: 120 mmol/L — ABNORMAL LOW (ref 135–145)
Sodium: 123 mmol/L — ABNORMAL LOW (ref 135–145)

## 2018-09-04 LAB — BASIC METABOLIC PANEL
ANION GAP: 6 (ref 5–15)
BUN: 12 mg/dL (ref 8–23)
CO2: 26 mmol/L (ref 22–32)
Calcium: 9 mg/dL (ref 8.9–10.3)
Chloride: 89 mmol/L — ABNORMAL LOW (ref 98–111)
Creatinine, Ser: 0.94 mg/dL (ref 0.44–1.00)
GFR calc Af Amer: >60 mL/min (ref >60)
GFR calc non Af Amer: 56 mL/min — ABNORMAL LOW (ref >60)
Glucose, Bld: 107 mg/dL — ABNORMAL HIGH (ref 70–99)
POTASSIUM: 4.4 mmol/L (ref 3.5–5.1)
Sodium: 121 mmol/L — ABNORMAL LOW (ref 135–145)

## 2018-09-04 MED ORDER — SODIUM CHLORIDE 3 % IV SOLN
INTRAVENOUS | Status: DC
  Administered 2018-09-04: 25 mL/h via INTRAVENOUS
  Filled 2018-09-04 (×4): qty 500

## 2018-09-04 MED ORDER — ENOXAPARIN SODIUM 40 MG/0.4ML ~~LOC~~ SOLN
40.0000 mg | SUBCUTANEOUS | Status: DC
  Administered 2018-09-04 – 2018-09-07 (×4): 40 mg via SUBCUTANEOUS
  Filled 2018-09-04 (×4): qty 0.4

## 2018-09-04 NOTE — Progress Notes (Signed)
[facility] [place] NAME: Janice Perkins    MR#:  [id number]  DATE OF BIRTH:  01-Jun-1933  SUBJECTIVE:  CHIEF COMPLAINT:   Chief Complaint  Patient presents with  . Dizziness  . Fall   -Feels much better today, sodium at 120. -Off 3% saline  REVIEW OF SYSTEMS:  Review of Systems  Constitutional: Positive for malaise/fatigue. Negative for chills and fever.  HENT: Negative for congestion, ear discharge, hearing loss and nosebleeds.   Eyes: Negative for blurred vision and double vision.  Respiratory: Negative for cough, shortness of breath and wheezing.   Cardiovascular: Negative for chest pain and palpitations.  Gastrointestinal: Positive for nausea. Negative for abdominal pain, constipation, diarrhea and vomiting.  Genitourinary: Negative for dysuria.  Musculoskeletal: Negative for myalgias.  Neurological: Positive for dizziness. Negative for focal weakness, seizures, weakness and headaches.  Psychiatric/Behavioral: Negative for depression.    DRUG ALLERGIES:   Allergies  Allergen Reactions  . Diphenhydramine Hcl Rash  . Penicillins Hives    .Has patient had a PCN reaction causing immediate rash, facial/tongue/throat swelling, SOB or lightheadedness with hypotension: Unknown Has patient had a PCN reaction causing severe rash involving mucus membranes or skin necrosis: Unknown Has patient had a PCN reaction that required hospitalization: Unknown Has patient had a PCN reaction occurring within the last 10 years: Unknown If all of the above answers are "NO", then may proceed with Cephalosporin use.   . Captopril Other (See Comments)  . Enalapril Maleate     Other reaction(s): Unknown  . Tape Itching  . Terfenadine Other (See Comments)  . Augmentin [Amoxicillin-Pot Clavulanate] Rash    Has patient had a PCN reaction causing immediate rash, facial/tongue/throat swelling, SOB or lightheadedness with hypotension: Unknown Has patient  had a PCN reaction causing severe rash involving mucus membranes or skin necrosis: Unknown Has patient had a PCN reaction that required hospitalization: Unknown Has patient had a PCN reaction occurring within the last 10 years: Unknown If all of the above answers are "NO", then may proceed with Cephalosporin use.  . Biaxin [Clarithromycin] Rash, Other (See Comments) and Hives    Other reaction(s): Unknown    VITALS:  Blood pressure 135/86, pulse (!) 56, temperature 98.1 F (36.7 C), temperature source Oral, resp. rate 13, weight 71.8 kg, SpO2 99 %.  PHYSICAL EXAMINATION:  Physical Exam   GENERAL:  85 y.o.-year-old patient lying in the bed with no acute distress.  EYES: Pupils equal, round, reactive to light and accommodation. No scleral icterus. Extraocular muscles intact.  HEENT: Head atraumatic, normocephalic. Oropharynx and nasopharynx clear.  NECK:  Supple, no jugular venous distention. No thyroid enlargement, no tenderness.  LUNGS: Normal breath sounds bilaterally, no wheezing, rales,rhonchi or crepitation. No use of accessory muscles of respiration.  Decreased bibasilar breath sounds CARDIOVASCULAR: S1, S2 normal. No murmurs, rubs, or gallops.  ABDOMEN: Soft, nontender, nondistended. Bowel sounds present. No organomegaly or mass.  EXTREMITIES: No pedal edema, cyanosis, or clubbing.  NEUROLOGIC: Cranial nerves II through XII are intact. Muscle strength 5/5 in all extremities. Sensation intact. Gait not checked.  PSYCHIATRIC: The patient is alert and oriented x 3.   SKIN: No obvious rash, lesion, or ulcer.    LABORATORY PANEL:   CBC Recent Labs  Lab 09/03/18 0346  WBC 7.8  HGB 14.9  HCT 40.3  PLT 266   ------------------------------------------------------------------------------------------------------------------  Chemistries  Recent Labs  Lab 09/03/18 0001  09/03/18 1713  09/04/18 0154 09/04/18 0600  NA  110*   < > 118*   < > 121* 121*  K 3.3*   < > 3.7  --  4.4   --   CL 77*   < > 87*  --  89*  --   CO2 21*   < > 22  --  26  --   GLUCOSE 114*   < > 107*  --  107*  --   BUN 7*   < > 10  --  12  --   CREATININE 0.55   < > 0.84  --  0.94  --   CALCIUM 8.6*   < > 8.9  --  9.0  --   MG  --    < > 2.2  --   --   --   AST 43*  --   --   --   --   --   ALT 30  --   --   --   --   --   ALKPHOS 55  --   --   --   --   --   BILITOT 1.2  --   --   --   --   --    < > = values in this interval not displayed.   ------------------------------------------------------------------------------------------------------------------  Cardiac Enzymes Recent Labs  Lab 09/02/18 0913  TROPONINI <0.03   ------------------------------------------------------------------------------------------------------------------  RADIOLOGY:  Dg Chest 2 View  Result Date: 09/02/2018 CLINICAL DATA:  Follow-up pneumonia. Dizziness. EXAM: CHEST - 2 VIEW COMPARISON:  08/08/2018 FINDINGS: Heart size is normal. Chronic aortic atherosclerosis. Bronchial thickening pattern without consolidation, collapse or effusion. No significant bone finding. Previous left shoulder replacement. IMPRESSION: 1. Bronchitis pattern. No consolidation or collapse. 2. Aortic atherosclerosis. Electronically Signed   By: Nelson Chimes M.D.   On: 09/02/2018 09:57   Ct Head Wo Contrast  Result Date: 09/02/2018 CLINICAL DATA:  Fall, vertigo. EXAM: CT HEAD WITHOUT CONTRAST TECHNIQUE: Contiguous axial images were obtained from the base of the skull through the vertex without intravenous contrast. COMPARISON:  CT scan of June 24, 2018. FINDINGS: Brain: Mild chronic ischemic white matter disease. Stable right basal ganglia lacunar infarction. No mass effect or midline shift is noted. Ventricular size is within normal limits. There is no evidence of mass lesion, hemorrhage or acute infarction. Vascular: No hyperdense vessel or unexpected calcification. Skull: Normal. Negative for fracture or focal lesion. Sinuses/Orbits:  No acute finding. Other: None. IMPRESSION: Mild chronic ischemic white matter disease. No acute intracranial abnormality seen. Electronically Signed   By: Marijo Conception, M.D.   On: 09/02/2018 10:04    EKG:   Orders placed or performed during the hospital encounter of 09/02/18  . EKG 12-Lead  . EKG 12-Lead  . ED EKG  . ED EKG  . EKG 12-Lead  . EKG 12-Lead    ASSESSMENT AND PLAN:   85 year old female with past medical history significant for chronic A. Fib not on anticoagulation, hypothyroidism, hypertension and depression presents to hospital secondary to dizziness and fatigue.  1.  Hyponatremia-possible SIADH -Did not improve with IV normal saline.  Fluids changed to 3% hypertonic saline.   -Sodium is improving.  At 121 today.  Off 3% saline now. -Fluid restriction ordered. -Patient was recently on chlorthalidone which has been discontinued -Nephrology consult appreciated -Continue to monitor.  Can be transferred to floor. -If starts to drop again, consider tolvaptan -Last CT chest was done in October 2019 and did not show any lung masses.  2.  Hypokalemia-replaced  3.  Hypertension-on Cardizem, as needed clonidine and IV labetalol as needed  4.  Hypothyroidism-continue Synthroid  5.  Depression and anxiety-on Prozac and BuSpar  6.  DVT prophylaxis-on Lovenox  Ambulates with a cane at baseline.   physical therapy today as sodium greater than 120 Husband updated at bedside    All the records are reviewed and case discussed with Care Management/Social Workerr. Management plans discussed with the patient, family and they are in agreement.  CODE STATUS: Full code  TOTAL TIME TAKING CARE OF THIS PATIENT: 38 minutes.   POSSIBLE D/C IN 1-2 DAYS, DEPENDING ON CLINICAL CONDITION.   Gladstone Lighter M.D on 09/04/2018 at 8:35 AM  Between 7am to 6pm - Pager - [phone]  After 6pm go to [email or web address] - password [email or web address]  Office   [phone]  CC: Primary care physician; [name], MD

## 2018-09-04 NOTE — Progress Notes (Signed)
MEDICATION RELATED CONSULT NOTE -  Pharmacy Consult for hypertonic saline (3% saline) Indication: severe hyponatremia possibly s/t chlorthalidone txt  Patient Measurements: Weight: 158 lb 4.6 oz (71.8 kg)(outpatient weight on 08/30/18) Adjusted Body Weight: 71.8 kg  Vital Signs: Temp: 98.1 F (36.7 C) (01/19 0746) Temp Source: Oral (01/19 0746) BP: 159/93 (01/19 0900) Pulse Rate: 49 (01/19 0900) Intake/Output from previous day: 01/18 0701 - 01/19 0700 In: 1014.9 [P.O.:840; I.V.:174.9] Out: 925 [Urine:925] Intake/Output from this shift: Total I/O In: 3 [I.V.:3] Out: 450 [Urine:450]  Labs: Recent Labs    09/02/18 0913 09/02/18 1429 09/03/18 0001 09/03/18 0346 09/03/18 1713 09/04/18 0154  WBC 7.2 7.0 7.3 7.8  --   --   HGB 14.1 14.1 14.6 14.9  --   --   HCT 37.6 38.6 38.9 40.3  --   --   PLT 262 268 289 266  --   --   APTT 33  --   --   --   --   --   CREATININE 0.78 0.67 0.55 0.58 0.84 0.94  MG  --   --   --  1.8 2.2  --   ALBUMIN 4.2  --  4.0  --   --   --   PROT 7.5  --  7.1  --   --   --   AST 44*  --  43*  --   --   --   ALT 29  --  30  --   --   --   ALKPHOS 60  --  55  --   --   --   BILITOT 1.3*  --  1.2  --   --   --    Estimated Creatinine Clearance: 40.1 mL/min (by C-G formula based on SCr of 0.94 mg/dL).  Medications:  Scheduled:  . atorvastatin  20 mg Oral QHS  . azelastine  1 spray Each Nare BID  . busPIRone  10 mg Oral BID  . cholecalciferol  1,000 Units Oral Daily  . clopidogrel  75 mg Oral Daily  . diltiazem  120 mg Oral Daily  . enoxaparin (LOVENOX) injection  40 mg Subcutaneous Q24H  . ferrous sulfate  325 mg Oral Q breakfast  . FLUoxetine  40 mg Oral Daily  . levothyroxine  75 mcg Oral QAC breakfast  . loratadine  10 mg Oral Daily  . magnesium oxide  400 mg Oral BID  . mometasone-formoterol  2 puff Inhalation BID  . pantoprazole  40 mg Oral Daily  . sodium chloride flush  3 mL Intravenous Q12H    Assessment: Patient arrives w/ C/o  dizziness and fall w/ h/o of afib, HTN and was started on chlorthalidone by cardiology. Dizziness started 10 days ago but progressively worsening. On initial labs patient presented w/ a Na of 111. Hypertonic saline was stopped on 1/18 at 1800 but subsequently restarted 1/19.  01/17: 0900 111 1130 110 1430 111 1900 112 2300 110   01/18: 0000 110 0400 112 1030 115 1417 116 1713 118  2157 120  01/19: 0154 121 0600 121 1010 120  Goal of Therapy:  Na: 128 - 130 mEq/L @ 24 hours. Goal range: 135 - 145; to not correct more than 8 - 10 mEq/L in a 24 hour period as to prevent central pontine myelinosis.  Plan:  Patient initially started on NS thought to be hypovolemic hyponatremia; but Na was not improving therefore was placed on 3% saline. Renal has been consulted. Patient is  currently receiving 3% saline @ 25 ml/hr  Will continue to monitor- q4h Na labs ordered.  Vallery Sa, PharmD Clinical Pharmacist 09/04/2018

## 2018-09-04 NOTE — Evaluation (Signed)
Physical Therapy Evaluation Patient Details Name: Janice Perkins MRN: [id number] DOB: November 01, 1932 Today's Date: 09/04/2018   History of Present Illness  Patient is an 85 y/o female that presents with dizziness and fatigue. She was initially taken to the CCU for low sodium levels, however is now on the floor.   Clinical Impression  Patient is an 85 y/o female that presented with altered electrolyte levels, with notable dizziness and hypertension. She is now on the floor with improved lab values but does endorse weakness and residual dizziness. She has been living at home with her husband, though is a poor historian as her thoughts are scattered throughout this session. She requires min A to complete bed mobility, however after that requires no more than cga x 1 with RW to complete transfers and ambulation in the hallway. She requires consistent redirecting in the hallways while ambulating, though is otherwise noted to likely be near her physical baseline. She would likely benefit from HHPT when she is ready for discharge.     Follow Up Recommendations Home health PT    Equipment Recommendations  Rolling walker with 5" wheels    Recommendations for Other Services       Precautions / Restrictions Precautions Precautions: Fall Restrictions Weight Bearing Restrictions: No      Mobility  Bed Mobility Overal bed mobility: Needs Assistance Bed Mobility: Supine to Sit     Supine to sit: Min assist     General bed mobility comments: Patient requires UE assistance to complete transfer due to trunkal weakness.   Transfers Overall transfer level: Needs assistance Equipment used: Rolling walker (2 wheeled) Transfers: Sit to/from Stand Sit to Stand: Min guard         General transfer comment: Patient is able to complete sit to stand transfer with cga only.   Ambulation/Gait Ambulation/Gait assistance: Min guard Gait Distance (Feet): 150 Feet Assistive device: Rolling walker (2  wheeled) Gait Pattern/deviations: WFL(Within Functional Limits)   Gait velocity interpretation: <1.31 ft/sec, indicative of household ambulator General Gait Details: Patient requires constant cuing for directions however no loss of balance observed with adequate stride length noted.   Stairs            Wheelchair Mobility    Modified Rankin (Stroke Patients Only)       Balance Overall balance assessment: Needs assistance Sitting-balance support: Feet supported Sitting balance-Leahy Scale: Fair     Standing balance support: Bilateral upper extremity supported Standing balance-Leahy Scale: Fair                               Pertinent Vitals/Pain Pain Assessment: No/denies pain    Home Living Family/patient expects to be discharged to:: Private residence Living Arrangements: Spouse/significant other Available Help at Discharge: Family;Available PRN/intermittently Type of Home: House Home Access: Level entry     Home Layout: One level Home Equipment: Walker - 2 wheels;Tub bench;Bedside commode;Grab bars - tub/shower Additional Comments: 1 small step inside the house     Prior Function Level of Independence: Independent         Comments: Pt reports being independent w/ all ADLs and driving prior to hospitalization (per a note from 9 months prior)     Hand Dominance   Dominant Hand: Right    Extremity/Trunk Assessment   Upper Extremity Assessment Upper Extremity Assessment: Overall WFL for tasks assessed    Lower Extremity Assessment Lower Extremity Assessment: Overall WFL for tasks assessed  Communication   Communication: No difficulties  Cognition Arousal/Alertness: Awake/alert Behavior During Therapy: Restless Overall Cognitive Status: Within Functional Limits for tasks assessed                                 General Comments: Patient is very talkative with scattered thoughts.       General Comments       Exercises General Exercises - Lower Extremity Long Arc Quad: AAROM;AROM;15 reps;Both Hip Flexion/Marching: AROM;AAROM;15 reps;Both;Seated Other Exercises Other Exercises: Assisted RN staff with changing linens, diaper, gown as patient had soiled these.    Assessment/Plan    PT Assessment Patient needs continued PT services  PT Problem List Decreased strength;Decreased balance;Decreased cognition;Decreased knowledge of precautions;Decreased mobility;Decreased activity tolerance;Decreased safety awareness       PT Treatment Interventions DME instruction;Functional mobility training;Balance training;Patient/family education;Gait training;Therapeutic activities;Neuromuscular re-education;Therapeutic exercise;Stair training    PT Goals (Current goals can be found in the Care Plan section)  Acute Rehab PT Goals Patient Stated Goal: To return home safely  PT Goal Formulation: With patient Time For Goal Achievement: 09/18/18 Potential to Achieve Goals: Good    Frequency Min 2X/week   Barriers to discharge        Co-evaluation               AM-PAC PT "6 Clicks" Mobility  Outcome Measure Help needed turning from your back to your side while in a flat bed without using bedrails?: A Little Help needed moving from lying on your back to sitting on the side of a flat bed without using bedrails?: A Little Help needed moving to and from a bed to a chair (including a wheelchair)?: A Little Help needed standing up from a chair using your arms (e.g., wheelchair or bedside chair)?: A Little Help needed to walk in hospital room?: A Little Help needed climbing 3-5 steps with a railing? : A Little 6 Click Score: 18    End of Session Equipment Utilized During Treatment: Gait belt Activity Tolerance: Patient tolerated treatment well;Patient limited by fatigue Patient left: in chair;with call bell/phone within reach;with chair alarm set Nurse Communication: Mobility status PT Visit  Diagnosis: Muscle weakness (generalized) (M62.81);Unsteadiness on feet (R26.81)    Time: 8841-6606 PT Time Calculation (min) (ACUTE ONLY): 29 min   Charges:   PT Evaluation $PT Eval Moderate Complexity: 1 Mod PT Treatments $Therapeutic Activity: 8-22 mins(Assisting RN staff with patient dressing/linen change working on transfers, standing balance)        Royce Macadamia PT, DPT, CSCS    09/04/2018, 2:24 PM

## 2018-09-04 NOTE — Progress Notes (Signed)
[facility]  ROUNDING NOTE   Subjective:  Serum sodium has come up appropriately. Serum sodium currently 121. 3% saline was held when sodium became 118 yesterday. Patient conversant this a.m. but slightly confused.   Objective:  Vital signs in last 24 hours:  Temp:  [97.9 F (36.6 C)-98.7 F (37.1 C)] 98.1 F (36.7 C) (01/19 0746) Pulse Rate:  [49-80] 49 (01/19 0900) Resp:  [12-23] 17 (01/19 0900) BP: (135-187)/(56-93) 159/93 (01/19 0900) SpO2:  [95 %-100 %] 99 % (01/19 0900)  Weight change:  Filed Weights   09/02/18 1100  Weight: 71.8 kg    Intake/Output: I/O last 3 completed shifts: In: 1735.7 [P.O.:1077; I.V.:508; IV Piggyback:150.7] Out: 1175 [Urine:1175]   Intake/Output this shift:  Total I/O In: 3 [I.V.:3] Out: 450 [Urine:450]  Physical Exam: General: No acute distress  Head: Normocephalic, atraumatic. Moist oral mucosal membranes  Eyes: Anicteric  Neck: Supple, trachea midline  Lungs:  Clear to auscultation, normal effort  Heart: S1S2 no rubs  Abdomen:  Soft, nontender, bowel sounds present  Extremities: Trace peripheral edema.  Neurologic: Awake, alert, following commands, slightly confused at times  Skin: No lesions       Basic Metabolic Panel: Recent Labs  Lab 09/02/18 0913  09/02/18 1429  09/02/18 2246 09/03/18 0001 09/03/18 0346  09/03/18 1713 09/03/18 2157 09/04/18 0154 09/04/18 0600 09/04/18 1010  NA 111*   < > 111*   < > 110* 110* 112*   < > 118* 120* 121* 121* 120*  K 3.1*  --   --   --  3.6 3.3* 3.8  --  3.7  --  4.4  --   --   CL 74*  --   --   --   --  77* 80*  --  87*  --  89*  --   --   CO2 23  --   --   --   --  21* 22  --  22  --  26  --   --   GLUCOSE 121*  --   --   --   --  114* 104*  --  107*  --  107*  --   --   BUN 10  --   --   --   --  7* 8  --  10  --  12  --   --   CREATININE 0.78  --  0.67  --   --  0.55 0.58  --  0.84  --  0.94  --   --   CALCIUM 9.2  --   --   --   --  8.6* 8.7*  --  8.9  --  9.0   --   --   MG  --   --   --   --   --   --  1.8  --  2.2  --   --   --   --    < > = values in this interval not displayed.    Liver Function Tests: Recent Labs  Lab 09/02/18 0913 09/03/18 0001  AST 44* 43*  ALT 29 30  ALKPHOS 60 55  BILITOT 1.3* 1.2  PROT 7.5 7.1  ALBUMIN 4.2 4.0   No results for input(s): LIPASE, AMYLASE in the last 168 hours. No results for input(s): AMMONIA in the last 168 hours.  CBC: Recent Labs  Lab 09/02/18 0913 09/02/18 1429 09/03/18 0001 09/03/18 0346  WBC 7.2 7.0 7.3  7.8  NEUTROABS 4.7  --   --  4.5  HGB 14.1 14.1 14.6 14.9  HCT 37.6 38.6 38.9 40.3  MCV 81.4 82.7 80.9 82.4  PLT 262 268 289 266    Cardiac Enzymes: Recent Labs  Lab 09/02/18 0913  TROPONINI <0.03    BNP: Invalid input(s): POCBNP  CBG: Recent Labs  Lab 09/02/18 1140  GLUCAP 140*    Microbiology: Results for orders placed or performed during the hospital encounter of 09/02/18  MRSA PCR Screening     Status: None   Collection Time: 09/02/18 12:05 PM  Result Value Ref Range Status   MRSA by PCR NEGATIVE NEGATIVE Final    Comment:        The GeneXpert MRSA Assay (FDA approved for NASAL specimens only), is one component of a comprehensive MRSA colonization surveillance program. It is not intended to diagnose MRSA infection nor to guide or monitor treatment for MRSA infections. Performed at [facility], [address] [place], [place] [address]     Coagulation Studies: Recent Labs    09/02/18 0913  LABPROT 11.9  INR 0.88    Urinalysis: Recent Labs    09/02/18 0936  COLORURINE STRAW*  LABSPEC 1.006  PHURINE 8.0  GLUCOSEU NEGATIVE  HGBUR NEGATIVE  BILIRUBINUR NEGATIVE  KETONESUR 5*  PROTEINUR 100*  NITRITE NEGATIVE  LEUKOCYTESUR NEGATIVE      Imaging: No results found.   Medications:   . sodium chloride (hypertonic)     . atorvastatin  20 mg Oral QHS  . azelastine  1 spray Each Nare BID  . busPIRone  10 mg Oral BID   . cholecalciferol  1,000 Units Oral Daily  . clopidogrel  75 mg Oral Daily  . diltiazem  120 mg Oral Daily  . enoxaparin (LOVENOX) injection  40 mg Subcutaneous Q24H  . ferrous sulfate  325 mg Oral Q breakfast  . FLUoxetine  40 mg Oral Daily  . levothyroxine  75 mcg Oral QAC breakfast  . loratadine  10 mg Oral Daily  . magnesium oxide  400 mg Oral BID  . mometasone-formoterol  2 puff Inhalation BID  . pantoprazole  40 mg Oral Daily  . sodium chloride flush  3 mL Intravenous Q12H   acetaminophen **OR** acetaminophen, albuterol, ALPRAZolam, cloNIDine, HYDROcodone-acetaminophen, labetalol, ondansetron **OR** ondansetron (ZOFRAN) IV, senna  Assessment/ Plan:  85 y.o. female with a PMHx of asthma, atrial fibrillation, breast cancer, COPD, hypertension, who was admitted to [facility] on 09/02/2018 for evaluation of dizziness and severe hyponatremia.    1.  Severe hyponatremia.  Patient was on chlorthalidone at home. 2.  Hypokalemia, improved. 3.  Hypertension.  Plan: Patient was started on 3% saline yesterday.  Serum sodium did come up to 118 at which point in time 3% saline was stopped.  Serum sodium currently 120.  We will reinitiate 3% saline at 25 cc/h and continue to monitor serum sodium.  Hypokalemia appears to have resolved with most recent serum potassium of 4.4.  Maintain the patient on diltiazem for blood pressure control.  Further plan as patient progresses.   LOS: 2 Arshia Spellman 1/19/202010:41 AM

## 2018-09-04 NOTE — Progress Notes (Signed)
MEDICATION RELATED CONSULT NOTE -  Pharmacy Consult for hypertonic saline (3% saline) Indication: severe hyponatremia possibly s/t chlorthalidone txt  Patient Measurements: Height: 5' (152.4 cm) Weight: 148 lb 14.4 oz (67.5 kg) IBW/kg (Calculated) : 45.5 Adjusted Body Weight: 71.8 kg  Vital Signs: Temp: 98 F (36.7 C) (01/19 1515) Temp Source: Oral (01/19 1515) BP: 121/66 (01/19 1515) Pulse Rate: 57 (01/19 1515) Intake/Output from previous day: 01/18 0701 - 01/19 0700 In: 1014.9 [P.O.:840; I.V.:174.9] Out: 925 [Urine:925] Intake/Output from this shift: Total I/O In: 87.9 [I.V.:87.9] Out: 450 [Urine:450]  Labs: Recent Labs    09/02/18 0913 09/02/18 1429 09/03/18 0001 09/03/18 0346 09/03/18 1713 09/04/18 0154  WBC 7.2 7.0 7.3 7.8  --   --   HGB 14.1 14.1 14.6 14.9  --   --   HCT 37.6 38.6 38.9 40.3  --   --   PLT 262 268 289 266  --   --   APTT 33  --   --   --   --   --   CREATININE 0.78 0.67 0.55 0.58 0.84 0.94  MG  --   --   --  1.8 2.2  --   ALBUMIN 4.2  --  4.0  --   --   --   PROT 7.5  --  7.1  --   --   --   AST 44*  --  43*  --   --   --   ALT 29  --  30  --   --   --   ALKPHOS 60  --  55  --   --   --   BILITOT 1.3*  --  1.2  --   --   --    Estimated Creatinine Clearance: 38.9 mL/min (by C-G formula based on SCr of 0.94 mg/dL).  Medications:  Scheduled:  . atorvastatin  20 mg Oral QHS  . azelastine  1 spray Each Nare BID  . busPIRone  10 mg Oral BID  . cholecalciferol  1,000 Units Oral Daily  . clopidogrel  75 mg Oral Daily  . diltiazem  120 mg Oral Daily  . enoxaparin (LOVENOX) injection  40 mg Subcutaneous Q24H  . ferrous sulfate  325 mg Oral Q breakfast  . FLUoxetine  40 mg Oral Daily  . levothyroxine  75 mcg Oral QAC breakfast  . loratadine  10 mg Oral Daily  . magnesium oxide  400 mg Oral BID  . mometasone-formoterol  2 puff Inhalation BID  . pantoprazole  40 mg Oral Daily  . sodium chloride flush  3 mL Intravenous Q12H     Assessment: Patient arrives w/ C/o dizziness and fall w/ h/o of afib, HTN and was started on chlorthalidone by cardiology. Dizziness started 10 days ago but progressively worsening. On initial labs patient presented w/ a Na of 111. Hypertonic saline was stopped on 1/18 at 1800 but subsequently restarted 1/19.  01/17: 0900 111 1130 110 1430 111 1900 112 2300 110   01/18: 0000 110 0400 112 1030 115 1417 116 1713 118  2157 120  01/19: 0154 121 0600 121 1010 120 1627 123  Goal of Therapy:  Na: 128 - 130 mEq/L @ 24 hours. Goal range: 135 - 145; to not correct more than 8 - 10 mEq/L in a 24 hour period as to prevent central pontine myelinosis.  Plan:  Patient initially started on NS thought to be hypovolemic hyponatremia; but Na was not improving therefore was placed on 3%  saline. Renal has been consulted. Patient is currently receiving 3% saline @ 25 ml/hr  Will continue to monitor- q4h Na labs ordered.  Evelena Asa, PharmD Clinical Pharmacist 09/04/2018

## 2018-09-04 NOTE — Progress Notes (Signed)
Called and notified husband of pending transfer to room 237. Report called to nurse caring for room 237.

## 2018-09-04 NOTE — Progress Notes (Signed)
Patient transferred to 237 at this time.

## 2018-09-05 LAB — BASIC METABOLIC PANEL
Anion gap: 8 (ref 5–15)
BUN: 21 mg/dL (ref 8–23)
CALCIUM: 8.9 mg/dL (ref 8.9–10.3)
CO2: 23 mmol/L (ref 22–32)
Chloride: 96 mmol/L — ABNORMAL LOW (ref 98–111)
Creatinine, Ser: 0.88 mg/dL (ref 0.44–1.00)
GFR calc Af Amer: >60 mL/min (ref >60)
GFR calc non Af Amer: >60 mL/min (ref >60)
Glucose, Bld: 111 mg/dL — ABNORMAL HIGH (ref 70–99)
Potassium: 3.9 mmol/L (ref 3.5–5.1)
SODIUM: 127 mmol/L — AB (ref 135–145)

## 2018-09-05 LAB — SODIUM
Sodium: 125 mmol/L — ABNORMAL LOW (ref 135–145)
Sodium: 127 mmol/L — ABNORMAL LOW (ref 135–145)
Sodium: 127 mmol/L — ABNORMAL LOW (ref 135–145)
Sodium: 128 mmol/L — ABNORMAL LOW (ref 135–145)

## 2018-09-05 MED ORDER — HYDRALAZINE HCL 50 MG PO TABS
50.0000 mg | ORAL_TABLET | Freq: Three times a day (TID) | ORAL | Status: DC
  Administered 2018-09-05 – 2018-09-08 (×7): 50 mg via ORAL
  Filled 2018-09-05 (×8): qty 1

## 2018-09-05 NOTE — Progress Notes (Signed)
MEDICATION RELATED CONSULT NOTE -  Pharmacy Consult for hypertonic saline (3% saline) Indication: severe hyponatremia possibly s/t chlorthalidone txt  Patient Measurements: Height: 5' (152.4 cm) Weight: 148 lb 14.4 oz (67.5 kg) IBW/kg (Calculated) : 45.5 Adjusted Body Weight: 71.8 kg  Vital Signs: Temp: 98.3 F (36.8 C) (01/20 0407) Temp Source: Oral (01/20 0407) BP: 210/114 (01/20 0417) Pulse Rate: 63 (01/20 0417) Intake/Output from previous day: 01/19 0701 - 01/20 0700 In: 359.9 [I.V.:359.9] Out: 450 [Urine:450] Intake/Output from this shift: Total I/O In: 172 [I.V.:172] Out: 0   Labs: Recent Labs    09/02/18 0913 09/02/18 1429 09/03/18 0001 09/03/18 0346 09/03/18 1713 09/04/18 0154  WBC 7.2 7.0 7.3 7.8  --   --   HGB 14.1 14.1 14.6 14.9  --   --   HCT 37.6 38.6 38.9 40.3  --   --   PLT 262 268 289 266  --   --   APTT 33  --   --   --   --   --   CREATININE 0.78 0.67 0.55 0.58 0.84 0.94  MG  --   --   --  1.8 2.2  --   ALBUMIN 4.2  --  4.0  --   --   --   PROT 7.5  --  7.1  --   --   --   AST 44*  --  43*  --   --   --   ALT 29  --  30  --   --   --   ALKPHOS 60  --  55  --   --   --   BILITOT 1.3*  --  1.2  --   --   --    Estimated Creatinine Clearance: 38.9 mL/min (by C-G formula based on SCr of 0.94 mg/dL).  Medications:  Scheduled:  . atorvastatin  20 mg Oral QHS  . azelastine  1 spray Each Nare BID  . busPIRone  10 mg Oral BID  . cholecalciferol  1,000 Units Oral Daily  . clopidogrel  75 mg Oral Daily  . diltiazem  120 mg Oral Daily  . enoxaparin (LOVENOX) injection  40 mg Subcutaneous Q24H  . ferrous sulfate  325 mg Oral Q breakfast  . FLUoxetine  40 mg Oral Daily  . levothyroxine  75 mcg Oral QAC breakfast  . loratadine  10 mg Oral Daily  . magnesium oxide  400 mg Oral BID  . mometasone-formoterol  2 puff Inhalation BID  . pantoprazole  40 mg Oral Daily  . sodium chloride flush  3 mL Intravenous Q12H    Assessment: Patient arrives w/  C/o dizziness and fall w/ h/o of afib, HTN and was started on chlorthalidone by cardiology. Dizziness started 10 days ago but progressively worsening. On initial labs patient presented w/ a Na of 111. Hypertonic saline was stopped on 1/18 at 1800 but subsequently restarted 1/19.  01/17: 0900 111 1130 110 1430 111 1900 112 2300 110   01/18: 0000 110 0400 112 1030 115 1417 116 1713 118  2157 120  01/19: 0154 121 0600 121 1010 120 1627 123 1927 123  2345 125  Goal of Therapy:  Na: 128 - 130 mEq/L @ 24 hours. Goal range: 135 - 145; to not correct more than 8 - 10 mEq/L in a 24 hour period as to prevent central pontine myelinosis.  Plan:  01/19 @ 2345 Na 125 -- has been trending appropriately, goal is 127 - 130 mEq/L @  24 hrs from now. Will continue to monitor.  Tobie Lords, PharmD, BCPS Clinical Pharmacist 09/05/2018

## 2018-09-05 NOTE — Care Management Note (Signed)
Case Management Note  Patient Details  Name: ALMETER WESTHOFF MRN: [id number] Date of Birth: 06-20-1933  Subjective/Objective:                 Patient admitted from home with hyponatremia which has required 3% saline. Transferred out of icu 1/19. 3% Saline discontinued today.  Physical therapy recommending home health and patient is in agreement.  Agency preference is [facility].  Patient verbalizes she is under a lot of stress at home due to several family members ( adult children and adult grandchild)  expecting patient to provide for financial needs and housing.  Patient is agreeable to home health nurse PT and social work. Social work may be able to provide some counseling and resources to assist patient with dealing with her family stress.  Action/Plan:  Referral called to and accepted by [facility] for RN pT SW  Expected Discharge Date:                  Expected Discharge Plan:  [place]  In-House Referral:   CM  Discharge planning Services  CM Consult  Post Acute Care Choice:  Home Health Choice offered to:  Patient  DME Arranged:    DME Agency:     HH Arranged:  RN, PT, Social Work [employer] Agency:  [place]  Status of Service:  In process, will continue to follow  If discussed at Long Length of Stay Meetings, dates discussed:    Additional Comments:  Katrina Stack, RN 09/05/2018, 10:54 AM

## 2018-09-05 NOTE — Care Management Important Message (Signed)
Copy of signed Medicare IM left with patient in room. 

## 2018-09-05 NOTE — Progress Notes (Signed)
Physical Therapy Treatment Patient Details Name: Janice Perkins MRN: [id number] DOB: 02/25/1933 Today's Date: 09/05/2018    History of Present Illness Patient is an 85 y/o female that presents with dizziness and fatigue. She was initially taken to the CCU for low sodium levels, however is now on the floor.     PT Comments    Initial attempt pt receiving medication; requested return. Second attempt, pt agreeable to PT. Recent BP high (within the hour) with current recheck at 168/75. Pt complains of all over body aches. Pt participated with LE exercises and up to the chair. Pt requires Min A to Min guard for mobility and frequent re direct to stay on task, as pt tends to converse rapidly changing subjects frequently and forgetting task at hand. Pt participates in long sit exercises and encouraged to repeat several times throughout the evening. Continue PT to progress strength and improve all functional mobility towards baseline.   Follow Up Recommendations  Home health PT     Equipment Recommendations  Rolling walker with 5" wheels    Recommendations for Other Services       Precautions / Restrictions Precautions Precautions: Fall Restrictions Weight Bearing Restrictions: No    Mobility  Bed Mobility Overal bed mobility: Needs Assistance Bed Mobility: Supine to Sit     Supine to sit: Min assist     General bed mobility comments: repeated cues for attention to task; Min a for trunk  Transfers Overall transfer level: Needs assistance Equipment used: Rolling walker (2 wheeled) Transfers: Sit to/from Stand Sit to Stand: Min guard         General transfer comment: cues for hand placement   Ambulation/Gait Ambulation/Gait assistance: Min guard Gait Distance (Feet): 5 Feet(minimized due to recent BP issues) Assistive device: Rolling walker (2 wheeled)       General Gait Details: Pt had recent BP of 219/99; post IV medication 168/75. Kept ambulation bed to chair to  ensure stability and due to pt pain complaints. Pt states she feels less anxious up in chair   Stairs             Wheelchair Mobility    Modified Rankin (Stroke Patients Only)       Balance Overall balance assessment: Needs assistance Sitting-balance support: Feet supported Sitting balance-Leahy Scale: Good     Standing balance support: Bilateral upper extremity supported Standing balance-Leahy Scale: Fair                              Cognition Arousal/Alertness: Awake/alert Behavior During Therapy: WFL for tasks assessed/performed Overall Cognitive Status: Within Functional Limits for tasks assessed                                 General Comments: subjectively reports feeling "antsy"      Exercises General Exercises - Lower Extremity Ankle Circles/Pumps: AROM;Both;20 reps Quad Sets: Strengthening;Both;20 reps Gluteal Sets: Strengthening;Both;20 reps Long Arc Quad: AROM;Both;10 reps Heel Slides: AROM;Both;10 reps Hip ABduction/ADduction: AROM;Both;10 reps    General Comments        Pertinent Vitals/Pain Pain Assessment: Faces Faces Pain Scale: Hurts little more Pain Location: general aches all over Pain Intervention(s): Monitored during session    Home Living                      Prior Function  PT Goals (current goals can now be found in the care plan section) Progress towards PT goals: Progressing toward goals    Frequency    Min 2X/week      PT Plan Current plan remains appropriate    Co-evaluation              AM-PAC PT "6 Clicks" Mobility   Outcome Measure  Help needed turning from your back to your side while in a flat bed without using bedrails?: A Little Help needed moving from lying on your back to sitting on the side of a flat bed without using bedrails?: A Little Help needed moving to and from a bed to a chair (including a wheelchair)?: A Little Help needed standing up  from a chair using your arms (e.g., wheelchair or bedside chair)?: A Little Help needed to walk in hospital room?: A Little Help needed climbing 3-5 steps with a railing? : A Little 6 Click Score: 18    End of Session Equipment Utilized During Treatment: Gait belt Activity Tolerance: Patient tolerated treatment well;Patient limited by fatigue;Patient limited by pain;Other (comment)(recent unstable BP) Patient left: in chair;with call bell/phone within reach;with chair alarm set   PT Visit Diagnosis: Muscle weakness (generalized) (M62.81);Unsteadiness on feet (R26.81)     Time: 5520-8022 PT Time Calculation (min) (ACUTE ONLY): 27 min  Charges:  $Gait Training: 8-22 mins $Therapeutic Exercise: 8-22 mins                      Larae Grooms, PTA 09/05/2018, 4:03 PM

## 2018-09-05 NOTE — Progress Notes (Signed)
[facility]  ROUNDING NOTE   Subjective:   Continues to be confused.   Na 128  Off hypertonic saline infusion  Objective:  Vital signs in last 24 hours:  Temp:  [97.5 F (36.4 C)-98.3 F (36.8 C)] 97.5 F (36.4 C) (01/20 0814) Pulse Rate:  [57-73] 64 (01/20 0814) Resp:  [16-20] 18 (01/20 0831) BP: (121-210)/(66-114) 142/87 (01/20 1138) SpO2:  [96 %-100 %] 100 % (01/20 0814) Weight:  [67.3 kg] 67.3 kg (01/20 0436)  Weight change:  Filed Weights   09/02/18 1100 09/04/18 1100 09/05/18 0436  Weight: 71.8 kg 67.5 kg 67.3 kg    Intake/Output: I/O last 3 completed shifts: In: 557 [I.V.:557] Out: 1350 [Urine:1350]   Intake/Output this shift:  Total I/O In: 240 [P.O.:240] Out: -   Physical Exam: General: No acute distress  Head: Normocephalic, atraumatic. Moist oral mucosal membranes  Eyes: Anicteric  Neck: Supple, trachea midline  Lungs:  Clear to auscultation, normal effort  Heart: S1S2 no rubs  Abdomen:  Soft, nontender, bowel sounds present  Extremities: No peripheral edema.  Neurologic: Awake, alert, following commands, confused.   Skin: No lesions       Basic Metabolic Panel: Recent Labs  Lab 09/03/18 0001 09/03/18 0346  09/03/18 1713  09/04/18 0154  09/04/18 1627 09/04/18 1927 09/04/18 2345 09/05/18 0408 09/05/18 0754  NA 110* 112*   < > 118*   < > 121*   < > 123* 123* 125* 127* 128*  K 3.3* 3.8  --  3.7  --  4.4  --   --   --   --  3.9  --   CL 77* 80*  --  87*  --  89*  --   --   --   --  96*  --   CO2 21* 22  --  22  --  26  --   --   --   --  23  --   GLUCOSE 114* 104*  --  107*  --  107*  --   --   --   --  111*  --   BUN 7* 8  --  10  --  12  --   --   --   --  21  --   CREATININE 0.55 0.58  --  0.84  --  0.94  --   --   --   --  0.88  --   CALCIUM 8.6* 8.7*  --  8.9  --  9.0  --   --   --   --  8.9  --   MG  --  1.8  --  2.2  --   --   --   --   --   --   --   --    < > = values in this interval not displayed.    Liver  Function Tests: Recent Labs  Lab 09/02/18 0913 09/03/18 0001  AST 44* 43*  ALT 29 30  ALKPHOS 60 55  BILITOT 1.3* 1.2  PROT 7.5 7.1  ALBUMIN 4.2 4.0   No results for input(s): LIPASE, AMYLASE in the last 168 hours. No results for input(s): AMMONIA in the last 168 hours.  CBC: Recent Labs  Lab 09/02/18 0913 09/02/18 1429 09/03/18 0001 09/03/18 0346  WBC 7.2 7.0 7.3 7.8  NEUTROABS 4.7  --   --  4.5  HGB 14.1 14.1 14.6 14.9  HCT 37.6 38.6 38.9 40.3  MCV  81.4 82.7 80.9 82.4  PLT 262 268 289 266    Cardiac Enzymes: Recent Labs  Lab 09/02/18 0913  TROPONINI <0.03    BNP: Invalid input(s): POCBNP  CBG: Recent Labs  Lab 09/02/18 1140  GLUCAP 140*    Microbiology: Results for orders placed or performed during the hospital encounter of 09/02/18  MRSA PCR Screening     Status: None   Collection Time: 09/02/18 12:05 PM  Result Value Ref Range Status   MRSA by PCR NEGATIVE NEGATIVE Final    Comment:        The GeneXpert MRSA Assay (FDA approved for NASAL specimens only), is one component of a comprehensive MRSA colonization surveillance program. It is not intended to diagnose MRSA infection nor to guide or monitor treatment for MRSA infections. Performed at [facility], [address] [place], [place] [address]     Coagulation Studies: No results for input(s): LABPROT, INR in the last 72 hours.  Urinalysis: No results for input(s): COLORURINE, LABSPEC, PHURINE, GLUCOSEU, HGBUR, BILIRUBINUR, KETONESUR, PROTEINUR, UROBILINOGEN, NITRITE, LEUKOCYTESUR in the last 72 hours.  Invalid input(s): APPERANCEUR    Imaging: No results found.   Medications:    . atorvastatin  20 mg Oral QHS  . azelastine  1 spray Each Nare BID  . busPIRone  10 mg Oral BID  . cholecalciferol  1,000 Units Oral Daily  . clopidogrel  75 mg Oral Daily  . diltiazem  120 mg Oral Daily  . enoxaparin (LOVENOX) injection  40 mg Subcutaneous Q24H  . ferrous sulfate  325  mg Oral Q breakfast  . FLUoxetine  40 mg Oral Daily  . levothyroxine  75 mcg Oral QAC breakfast  . loratadine  10 mg Oral Daily  . magnesium oxide  400 mg Oral BID  . mometasone-formoterol  2 puff Inhalation BID  . pantoprazole  40 mg Oral Daily  . sodium chloride flush  3 mL Intravenous Q12H   acetaminophen **OR** acetaminophen, albuterol, ALPRAZolam, cloNIDine, HYDROcodone-acetaminophen, labetalol, ondansetron **OR** ondansetron (ZOFRAN) IV, senna  Assessment/ Plan:  85 y.o.white female with a PMHx of asthma, atrial fibrillation, breast cancer, COPD, hypertension, who was admitted to [facility] on 09/02/2018 for evaluation of dizziness and severe hyponatremia.    1.  Severe hyponatremia.  Patient was on chlorthalidone at home. 2.  Hypokalemia, improved. 3.  Hypertension.  Status post hypertonic saline infusion.  Continue to hold thiazide diuretics.  Fluid restriction No indication for salt tabs.  Continue diltiazem and PRN clonidine.    LOS: 3 Halsey Persaud 1/20/202012:31 PM

## 2018-09-05 NOTE — Progress Notes (Signed)
[place] [facility] [place] NAME: Janice Perkins    MR#:  [id number]  DATE OF BIRTH:  03-08-1933  SUBJECTIVE:  CHIEF COMPLAINT:   Chief Complaint  Patient presents with  . Dizziness  . Fall   -Patient is confused this morning.  Sodium improving at 127  REVIEW OF SYSTEMS:  Review of Systems  Constitutional: Positive for malaise/fatigue. Negative for chills and fever.  HENT: Negative for congestion, ear discharge, hearing loss and nosebleeds.   Eyes: Negative for blurred vision and double vision.  Respiratory: Negative for cough, shortness of breath and wheezing.   Cardiovascular: Negative for chest pain and palpitations.  Gastrointestinal: Negative for abdominal pain, constipation, diarrhea, nausea and vomiting.  Genitourinary: Negative for dysuria.  Musculoskeletal: Negative for myalgias.  Neurological: Negative for dizziness, focal weakness, seizures, weakness and headaches.  Psychiatric/Behavioral: Negative for depression.    DRUG ALLERGIES:   Allergies  Allergen Reactions  . Diphenhydramine Hcl Rash  . Penicillins Hives    .Has patient had a PCN reaction causing immediate rash, facial/tongue/throat swelling, SOB or lightheadedness with hypotension: Unknown Has patient had a PCN reaction causing severe rash involving mucus membranes or skin necrosis: Unknown Has patient had a PCN reaction that required hospitalization: Unknown Has patient had a PCN reaction occurring within the last 10 years: Unknown If all of the above answers are "NO", then may proceed with Cephalosporin use.   . Captopril Other (See Comments)  . Enalapril Maleate     Other reaction(s): Unknown  . Tape Itching  . Terfenadine Other (See Comments)  . Augmentin [Amoxicillin-Pot Clavulanate] Rash    Has patient had a PCN reaction causing immediate rash, facial/tongue/throat swelling, SOB or lightheadedness with hypotension: Unknown Has patient had a PCN reaction  causing severe rash involving mucus membranes or skin necrosis: Unknown Has patient had a PCN reaction that required hospitalization: Unknown Has patient had a PCN reaction occurring within the last 10 years: Unknown If all of the above answers are "NO", then may proceed with Cephalosporin use.  . Biaxin [Clarithromycin] Rash, Other (See Comments) and Hives    Other reaction(s): Unknown    VITALS:  Blood pressure (!) 186/96, pulse 64, temperature (!) 97.5 F (36.4 C), temperature source Oral, resp. rate 20, height 5' (1.524 m), weight 67.3 kg, SpO2 100 %.  PHYSICAL EXAMINATION:  Physical Exam   GENERAL:  85 y.o.-year-old patient lying in the bed with no acute distress.  EYES: Pupils equal, round, reactive to light and accommodation. No scleral icterus. Extraocular muscles intact.  HEENT: Head atraumatic, normocephalic. Oropharynx and nasopharynx clear.  NECK:  Supple, no jugular venous distention. No thyroid enlargement, no tenderness.  LUNGS: Normal breath sounds bilaterally, no wheezing, rales,rhonchi or crepitation. No use of accessory muscles of respiration.  Decreased bibasilar breath sounds CARDIOVASCULAR: S1, S2 normal. No murmurs, rubs, or gallops.  ABDOMEN: Soft, nontender, nondistended. Bowel sounds present. No organomegaly or mass.  EXTREMITIES: No pedal edema, cyanosis, or clubbing.  NEUROLOGIC: Cranial nerves II through XII are intact. Muscle strength 5/5 in all extremities. Sensation intact. Gait not checked.  PSYCHIATRIC: The patient is alert and oriented x 3.  Very confused at times. SKIN: No obvious rash, lesion, or ulcer.    LABORATORY PANEL:   CBC Recent Labs  Lab 09/03/18 0346  WBC 7.8  HGB 14.9  HCT 40.3  PLT 266   ------------------------------------------------------------------------------------------------------------------  Chemistries  Recent Labs  Lab 09/03/18 0001  09/03/18 1713  09/05/18 0408  NA 110*   < > 118*   < > 127*  K 3.3*   < >  3.7   < > 3.9  CL 77*   < > 87*   < > 96*  CO2 21*   < > 22   < > 23  GLUCOSE 114*   < > 107*   < > 111*  BUN 7*   < > 10   < > 21  CREATININE 0.55   < > 0.84   < > 0.88  CALCIUM 8.6*   < > 8.9   < > 8.9  MG  --    < > 2.2  --   --   AST 43*  --   --   --   --   ALT 30  --   --   --   --   ALKPHOS 55  --   --   --   --   BILITOT 1.2  --   --   --   --    < > = values in this interval not displayed.   ------------------------------------------------------------------------------------------------------------------  Cardiac Enzymes Recent Labs  Lab 09/02/18 0913  TROPONINI <0.03   ------------------------------------------------------------------------------------------------------------------  RADIOLOGY:  No results found.  EKG:   Orders placed or performed during the hospital encounter of 09/02/18  . EKG 12-Lead  . EKG 12-Lead  . ED EKG  . ED EKG  . EKG 12-Lead  . EKG 12-Lead    ASSESSMENT AND PLAN:   85 year old female with past medical history significant for chronic A. Fib not on anticoagulation, hypothyroidism, hypertension and depression presents to hospital secondary to dizziness and fatigue.  1.  Hyponatremia-possible SIADH - currently on 3% hypertonic saline.  Sodium of 127. -Discontinue 3% saline today.  Fluid restriction at 1200 cc/day -Patient was recently on chlorthalidone which has been discontinued -Nephrology consult appreciated -Continue to monitor.    -Last CT chest was done in October 2019 and did not show any lung masses.  2.  Acute delirium-likely being in the hospital.  Avoid any pain or sleeping medications.  Easily reoriented at this time.  Continue to monitor  3.  Hypertension-on Cardizem, as needed clonidine and IV labetalol as needed  4.  Hypothyroidism-continue Synthroid  5.  Depression and anxiety-on Prozac and BuSpar  6.  DVT prophylaxis-on Lovenox  Ambulates with a cane at baseline.   Physical therapy recommended home  health   All the records are reviewed and case discussed with Care Management/Social Workerr. Management plans discussed with the patient, family and they are in agreement.  CODE STATUS: Full code  TOTAL TIME TAKING CARE OF THIS PATIENT: 37 minutes.   POSSIBLE D/C IN 1-2 DAYS, DEPENDING ON CLINICAL CONDITION.   Gladstone Lighter M.D on 09/05/2018 at 8:15 AM  Between 7am to 6pm - Pager - [phone]  After 6pm go to [email or web address] - password [facility]  Office  [phone]  CC: Primary care physician; [name], MD

## 2018-09-05 NOTE — Progress Notes (Signed)
MEDICATION RELATED CONSULT NOTE -  Pharmacy Consult for hypertonic saline (3% saline) Indication: severe hyponatremia possibly s/t chlorthalidone txt  Patient Measurements: Height: 5' (152.4 cm) Weight: 148 lb 4.8 oz (67.3 kg) IBW/kg (Calculated) : 45.5 Adjusted Body Weight: 71.8 kg  Vital Signs: Temp: 98.3 F (36.8 C) (01/20 0407) Temp Source: Oral (01/20 0407) BP: 173/87 (01/20 0556) Pulse Rate: 58 (01/20 0556) Intake/Output from previous day: 01/19 0701 - 01/20 0700 In: 457.1 [I.V.:457.1] Out: 1050 [Urine:1050] Intake/Output from this shift: Total I/O In: 269.2 [I.V.:269.2] Out: 600 [Urine:600]  Labs: Recent Labs    09/02/18 0913 09/02/18 1429 09/03/18 0001 09/03/18 0346 09/03/18 1713 09/04/18 0154 09/05/18 0408  WBC 7.2 7.0 7.3 7.8  --   --   --   HGB 14.1 14.1 14.6 14.9  --   --   --   HCT 37.6 38.6 38.9 40.3  --   --   --   PLT 262 268 289 266  --   --   --   APTT 33  --   --   --   --   --   --   CREATININE 0.78 0.67 0.55 0.58 0.84 0.94 0.88  MG  --   --   --  1.8 2.2  --   --   ALBUMIN 4.2  --  4.0  --   --   --   --   PROT 7.5  --  7.1  --   --   --   --   AST 44*  --  43*  --   --   --   --   ALT 29  --  30  --   --   --   --   ALKPHOS 60  --  55  --   --   --   --   BILITOT 1.3*  --  1.2  --   --   --   --    Estimated Creatinine Clearance: 41.4 mL/min (by C-G formula based on SCr of 0.88 mg/dL).  Medications:  Scheduled:  . atorvastatin  20 mg Oral QHS  . azelastine  1 spray Each Nare BID  . busPIRone  10 mg Oral BID  . cholecalciferol  1,000 Units Oral Daily  . clopidogrel  75 mg Oral Daily  . diltiazem  120 mg Oral Daily  . enoxaparin (LOVENOX) injection  40 mg Subcutaneous Q24H  . ferrous sulfate  325 mg Oral Q breakfast  . FLUoxetine  40 mg Oral Daily  . levothyroxine  75 mcg Oral QAC breakfast  . loratadine  10 mg Oral Daily  . magnesium oxide  400 mg Oral BID  . mometasone-formoterol  2 puff Inhalation BID  . pantoprazole  40 mg  Oral Daily  . sodium chloride flush  3 mL Intravenous Q12H    Assessment: Patient arrives w/ C/o dizziness and fall w/ h/o of afib, HTN and was started on chlorthalidone by cardiology. Dizziness started 10 days ago but progressively worsening. On initial labs patient presented w/ a Na of 111. Hypertonic saline was stopped on 1/18 at 1800 but subsequently restarted 1/19.  01/17: 0900 111 1130 110 1430 111 1900 112 2300 110   01/18: 0000 110 0400 112 1030 115 1417 116 1713 118  2157 120  01/19: 0154 121 0600 121 1010 120 1627 123 1927 123  2345 125  Goal of Therapy:  Na: 128 - 130 mEq/L @ 24 hours. Goal range: 135 - 145; to  not correct more than 8 - 10 mEq/L in a 24 hour period as to prevent central pontine myelinosis.  Plan:  01/20 @ 0400 Na 127 -- has been trending appropriately, patient is at goal for today of 127 mEq/L. Next 24 hour goal is 135 mEq/L, should consider slowing down/d/cing 3% saline/or switching to NS if patient continue to rise towards goal. Patient's mentation is much better this am per RN,  Tobie Lords, PharmD, BCPS Clinical Pharmacist 09/05/2018

## 2018-09-06 LAB — BASIC METABOLIC PANEL
Anion gap: 7 (ref 5–15)
BUN: 20 mg/dL (ref 8–23)
CO2: 25 mmol/L (ref 22–32)
Calcium: 8.8 mg/dL — ABNORMAL LOW (ref 8.9–10.3)
Chloride: 94 mmol/L — ABNORMAL LOW (ref 98–111)
Creatinine, Ser: 0.9 mg/dL (ref 0.44–1.00)
GFR calc Af Amer: >60 mL/min (ref >60)
GFR calc non Af Amer: 59 mL/min — ABNORMAL LOW (ref >60)
GLUCOSE: 118 mg/dL — AB (ref 70–99)
Potassium: 3.4 mmol/L — ABNORMAL LOW (ref 3.5–5.1)
Sodium: 126 mmol/L — ABNORMAL LOW (ref 135–145)

## 2018-09-06 LAB — SODIUM
Sodium: 127 mmol/L — ABNORMAL LOW (ref 135–145)
Sodium: 126 mmol/L — ABNORMAL LOW (ref 135–145)

## 2018-09-06 MED ORDER — POTASSIUM CHLORIDE CRYS ER 20 MEQ PO TBCR
40.0000 meq | EXTENDED_RELEASE_TABLET | Freq: Once | ORAL | Status: AC
  Administered 2018-09-06: 40 meq via ORAL
  Filled 2018-09-06: qty 2

## 2018-09-06 NOTE — Plan of Care (Signed)
  Problem: Clinical Measurements: Goal: Ability to maintain clinical measurements within normal limits will improve Outcome: Progressing  Sodium levels 127, Potassium 3.4 (prior to replacement) Problem: Activity: Goal: Risk for activity intolerance will decrease Outcome: Progressing  Pt ambulated around 2A nursing station and 2C with walker, no assistance, or complaints.

## 2018-09-06 NOTE — Progress Notes (Signed)
Physical Therapy Treatment Patient Details Name: Janice Perkins MRN: [id number] DOB: Apr 17, 1933 Today's Date: 09/06/2018    History of Present Illness Patient is an 85 y/o female that presents with dizziness and fatigue. She was initially taken to the CCU for low sodium levels, however is now on the floor.     PT Comments    Pt pleasantly to PT session and showed good effort and willingness to participate and push herself for prolonged bout of ambulation. ~450 ft.  She had some minimal fatigue with the effort but no LOBs, good confidence and vitals remained relatively stable (O2 stayed in high 90s, HR increased from 80s to 110s).  Overall pt did well and needed only minimal cuing during ambulation.     Follow Up Recommendations  Home health PT     Equipment Recommendations       Recommendations for Other Services       Precautions / Restrictions Restrictions Weight Bearing Restrictions: No    Mobility  Bed Mobility Overal bed mobility: Modified Independent Bed Mobility: Supine to Sit     Supine to sit: Supervision     General bed mobility comments: Pt able to get to EOB today w/o assist  Transfers Overall transfer level: Modified independent Equipment used: Rolling walker (2 wheeled) Transfers: Sit to/from Stand Sit to Stand: Supervision         General transfer comment: Cues for sequencing and hand placement, but no phyiscal assist  Ambulation/Gait Ambulation/Gait assistance: Min guard Gait Distance (Feet): 450 Feet Assistive device: Rolling walker (2 wheeled)       General Gait Details: Pt able to ambulate with good confidence, safety and consistent cadence.  She needed directional cuing and pointers about consistent cadnece/gait but no physical assist required t/o the effort.  Her O2 remained in the high 90s on room air t/o the effort, she did have increase in HR from the 80s to the 110s with prolonged ambulation.    Stairs             Wheelchair  Mobility    Modified Rankin (Stroke Patients Only)       Balance Overall balance assessment: Modified Independent                                          Cognition Arousal/Alertness: Awake/alert Behavior During Therapy: WFL for tasks assessed/performed Overall Cognitive Status: Within Functional Limits for tasks assessed                                        Exercises      General Comments        Pertinent Vitals/Pain Pain Assessment: No/denies pain    Home Living                      Prior Function            PT Goals (current goals can now be found in the care plan section) Progress towards PT goals: Progressing toward goals    Frequency    Min 2X/week      PT Plan Current plan remains appropriate    Co-evaluation              AM-PAC PT "6 Clicks" Mobility   Outcome Measure  Help needed turning from your back to your side while in a flat bed without using bedrails?: None Help needed moving from lying on your back to sitting on the side of a flat bed without using bedrails?: None Help needed moving to and from a bed to a chair (including a wheelchair)?: None Help needed standing up from a chair using your arms (e.g., wheelchair or bedside chair)?: None Help needed to walk in hospital room?: None Help needed climbing 3-5 steps with a railing? : A Little 6 Click Score: 23    End of Session Equipment Utilized During Treatment: Gait belt Activity Tolerance: Patient tolerated treatment well Patient left: in chair;with call bell/phone within reach;with chair alarm set Nurse Communication: Mobility status PT Visit Diagnosis: Muscle weakness (generalized) (M62.81);Unsteadiness on feet (R26.81)     Time: 5701-7793 PT Time Calculation (min) (ACUTE ONLY): 18 min  Charges:  $Gait Training: 8-22 mins                     Kreg Shropshire, DPT 09/06/2018, 1:37 PM

## 2018-09-06 NOTE — Progress Notes (Signed)
[place] [facility] [place] NAME: Janice Perkins    MR#:  [id number]  DATE OF BIRTH:  [birth year]  SUBJECTIVE:  CHIEF COMPLAINT:   Chief Complaint  Patient presents with  . Dizziness  . Fall   -mentation is much improved today - sodium at 126  REVIEW OF SYSTEMS:  Review of Systems  Constitutional: Positive for malaise/fatigue. Negative for chills and fever.  HENT: Negative for congestion, ear discharge, hearing loss and nosebleeds.   Eyes: Negative for blurred vision and double vision.  Respiratory: Negative for cough, shortness of breath and wheezing.   Cardiovascular: Negative for chest pain and palpitations.  Gastrointestinal: Negative for abdominal pain, constipation, diarrhea, nausea and vomiting.  Genitourinary: Negative for dysuria.  Musculoskeletal: Negative for myalgias.  Neurological: Negative for dizziness, focal weakness, seizures, weakness and headaches.  Psychiatric/Behavioral: Negative for depression.    DRUG ALLERGIES:   Allergies  Allergen Reactions  . Diphenhydramine Hcl Rash  . Penicillins Hives    .Has patient had a PCN reaction causing immediate rash, facial/tongue/throat swelling, SOB or lightheadedness with hypotension: Unknown Has patient had a PCN reaction causing severe rash involving mucus membranes or skin necrosis: Unknown Has patient had a PCN reaction that required hospitalization: Unknown Has patient had a PCN reaction occurring within the last 10 years: Unknown If all of the above answers are "NO", then may proceed with Cephalosporin use.   . Captopril Other (See Comments)  . Enalapril Maleate     Other reaction(s): Unknown  . Tape Itching  . Terfenadine Other (See Comments)  . Augmentin [Amoxicillin-Pot Clavulanate] Rash    Has patient had a PCN reaction causing immediate rash, facial/tongue/throat swelling, SOB or lightheadedness with hypotension: Unknown Has patient had a PCN reaction causing  severe rash involving mucus membranes or skin necrosis: Unknown Has patient had a PCN reaction that required hospitalization: Unknown Has patient had a PCN reaction occurring within the last 10 years: Unknown If all of the above answers are "NO", then may proceed with Cephalosporin use.  . Biaxin [Clarithromycin] Rash, Other (See Comments) and Hives    Other reaction(s): Unknown    VITALS:  Blood pressure (!) 181/96, pulse 64, temperature 99.2 F (37.3 C), temperature source Oral, resp. rate 19, height 5' (1.524 m), weight 67.4 kg, SpO2 100 %.  PHYSICAL EXAMINATION:  Physical Exam   GENERAL:  85 y.o.-year-old patient lying in the bed with no acute distress.  EYES: Pupils equal, round, reactive to light and accommodation. No scleral icterus. Extraocular muscles intact.  HEENT: Head atraumatic, normocephalic. Oropharynx and nasopharynx clear.  NECK:  Supple, no jugular venous distention. No thyroid enlargement, no tenderness.  LUNGS: Normal breath sounds bilaterally, no wheezing, rales,rhonchi or crepitation. No use of accessory muscles of respiration.  Decreased bibasilar breath sounds CARDIOVASCULAR: S1, S2 normal. No murmurs, rubs, or gallops.  ABDOMEN: Soft, nontender, nondistended. Bowel sounds present. No organomegaly or mass.  EXTREMITIES: No pedal edema, cyanosis, or clubbing.  NEUROLOGIC: Cranial nerves II through XII are intact. Muscle strength 5/5 in all extremities. Sensation intact. Gait not checked.  PSYCHIATRIC: The patient is alert and oriented x 3.   SKIN: No obvious rash, lesion, or ulcer.    LABORATORY PANEL:   CBC Recent Labs  Lab 09/03/18 0346  WBC 7.8  HGB 14.9  HCT 40.3  PLT 266   ------------------------------------------------------------------------------------------------------------------  Chemistries  Recent Labs  Lab 09/03/18 0001  09/03/18 1713  09/06/18 0157 09/06/18 0747  NA 110*   < >  118*   < > 126* 127*  K 3.3*   < > 3.7   < > 3.4*   --   CL 77*   < > 87*   < > 94*  --   CO2 21*   < > 22   < > 25  --   GLUCOSE 114*   < > 107*   < > 118*  --   BUN 7*   < > 10   < > 20  --   CREATININE 0.55   < > 0.84   < > 0.90  --   CALCIUM 8.6*   < > 8.9   < > 8.8*  --   MG  --    < > 2.2  --   --   --   AST 43*  --   --   --   --   --   ALT 30  --   --   --   --   --   ALKPHOS 55  --   --   --   --   --   BILITOT 1.2  --   --   --   --   --    < > = values in this interval not displayed.   ------------------------------------------------------------------------------------------------------------------  Cardiac Enzymes Recent Labs  Lab 09/02/18 0913  TROPONINI <0.03   ------------------------------------------------------------------------------------------------------------------  RADIOLOGY:  No results found.  EKG:   Orders placed or performed during the hospital encounter of 09/02/18  . EKG 12-Lead  . EKG 12-Lead  . ED EKG  . ED EKG  . EKG 12-Lead  . EKG 12-Lead    ASSESSMENT AND PLAN:   85 year old female with past medical history significant for chronic A. Fib not on anticoagulation, hypothyroidism, hypertension and depression presents to hospital secondary to dizziness and fatigue.  1.  Hyponatremia-possible SIADH -  Off 3% hypertonic saline.  Ob fluid restriction of 1200cc/day - Sodium of 126. -Patient was recently on chlorthalidone which has been discontinued -Nephrology consult appreciated  - will check about use of tolvaptan -Continue to monitor.    -Last CT chest was done in October 2019 and did not show any lung masses.  2.  Acute delirium-likely being in the hospital.  Much improved today -Avoid any pain or sleeping medications.  Easily reoriented at this time.  Continue to monitor  3.  Hypertension-on Cardizem and oral hydralazine, - add norvasc if needed - as needed clonidine and IV labetalol as needed  4.  Hypothyroidism-continue Synthroid  5.  Depression and anxiety-on Prozac and  BuSpar  6.  DVT prophylaxis-on Lovenox  7. Hypokalemia- potassium replaced     Ambulates with a cane at baseline.   Physical therapy recommended home health Encourage ambulation   All the records are reviewed and case discussed with Care Management/Social Workerr. Management plans discussed with the patient, family and they are in agreement.  CODE STATUS: Full code  TOTAL TIME TAKING CARE OF THIS PATIENT: 37 minutes.   POSSIBLE D/C IN 1-2 DAYS, DEPENDING ON CLINICAL CONDITION.   Gladstone Lighter M.D on 09/06/2018 at 8:30 AM  Between 7am to 6pm - Pager - [phone]  After 6pm go to [email or web address] - password [facility]  Office  [phone]  CC: Primary care physician; [name], MD

## 2018-09-06 NOTE — Progress Notes (Signed)
[facility]  ROUNDING NOTE   Subjective:   More alert and oriented. Na 126-128.   Objective:  Vital signs in last 24 hours:  Temp:  [97.8 F (36.6 C)-99.2 F (37.3 C)] 99.2 F (37.3 C) (01/21 0805) Pulse Rate:  [53-80] 64 (01/21 0811) Resp:  [16-19] 19 (01/21 0805) BP: (119-217)/(62-99) 181/96 (01/21 0806) SpO2:  [97 %-100 %] 100 % (01/21 0805) Weight:  [67.4 kg] 67.4 kg (01/21 0432)  Weight change: -0.136 kg Filed Weights   09/04/18 1100 09/05/18 0436 09/06/18 0432  Weight: 67.5 kg 67.3 kg 67.4 kg    Intake/Output: I/O last 3 completed shifts: In: 612.1 [P.O.:240; I.V.:372.1] Out: 850 [Urine:850]   Intake/Output this shift:  Total I/O In: -  Out: 550 [Urine:550]  Physical Exam: General: No acute distress  Head: Normocephalic, atraumatic. Moist oral mucosal membranes  Eyes: Anicteric  Neck: Supple, trachea midline  Lungs:  Clear to auscultation, normal effort  Heart: S1S2 no rubs  Abdomen:  Soft, nontender, bowel sounds present  Extremities: No peripheral edema.  Neurologic: Awake, alert, following commands, confused.   Skin: No lesions       Basic Metabolic Panel: Recent Labs  Lab 09/03/18 0346  09/03/18 1713  09/04/18 0154  09/05/18 0408 09/05/18 0754 09/05/18 1436 09/05/18 2023 09/06/18 0157 09/06/18 0747  NA 112*   < > 118*   < > 121*   < > 127* 128* 127* 127* 126* 127*  K 3.8  --  3.7  --  4.4  --  3.9  --   --   --  3.4*  --   CL 80*  --  87*  --  89*  --  96*  --   --   --  94*  --   CO2 22  --  22  --  26  --  23  --   --   --  25  --   GLUCOSE 104*  --  107*  --  107*  --  111*  --   --   --  118*  --   BUN 8  --  10  --  12  --  21  --   --   --  20  --   CREATININE 0.58  --  0.84  --  0.94  --  0.88  --   --   --  0.90  --   CALCIUM 8.7*  --  8.9  --  9.0  --  8.9  --   --   --  8.8*  --   MG 1.8  --  2.2  --   --   --   --   --   --   --   --   --    < > = values in this interval not displayed.    Liver Function  Tests: Recent Labs  Lab 09/02/18 0913 09/03/18 0001  AST 44* 43*  ALT 29 30  ALKPHOS 60 55  BILITOT 1.3* 1.2  PROT 7.5 7.1  ALBUMIN 4.2 4.0   No results for input(s): LIPASE, AMYLASE in the last 168 hours. No results for input(s): AMMONIA in the last 168 hours.  CBC: Recent Labs  Lab 09/02/18 0913 09/02/18 1429 09/03/18 0001 09/03/18 0346  WBC 7.2 7.0 7.3 7.8  NEUTROABS 4.7  --   --  4.5  HGB 14.1 14.1 14.6 14.9  HCT 37.6 38.6 38.9 40.3  MCV 81.4 82.7 80.9 82.4  PLT 262 268 289 266    Cardiac Enzymes: Recent Labs  Lab 09/02/18 0913  TROPONINI <0.03    BNP: Invalid input(s): POCBNP  CBG: Recent Labs  Lab 09/02/18 1140  GLUCAP 140*    Microbiology: Results for orders placed or performed during the hospital encounter of 09/02/18  MRSA PCR Screening     Status: None   Collection Time: 09/02/18 12:05 PM  Result Value Ref Range Status   MRSA by PCR NEGATIVE NEGATIVE Final    Comment:        The GeneXpert MRSA Assay (FDA approved for NASAL specimens only), is one component of a comprehensive MRSA colonization surveillance program. It is not intended to diagnose MRSA infection nor to guide or monitor treatment for MRSA infections. Performed at [facility], [address] [place], [place] [address]     Coagulation Studies: No results for input(s): LABPROT, INR in the last 72 hours.  Urinalysis: No results for input(s): COLORURINE, LABSPEC, PHURINE, GLUCOSEU, HGBUR, BILIRUBINUR, KETONESUR, PROTEINUR, UROBILINOGEN, NITRITE, LEUKOCYTESUR in the last 72 hours.  Invalid input(s): APPERANCEUR    Imaging: No results found.   Medications:    . atorvastatin  20 mg Oral QHS  . azelastine  1 spray Each Nare BID  . busPIRone  10 mg Oral BID  . cholecalciferol  1,000 Units Oral Daily  . clopidogrel  75 mg Oral Daily  . diltiazem  120 mg Oral Daily  . enoxaparin (LOVENOX) injection  40 mg Subcutaneous Q24H  . ferrous sulfate  325 mg Oral  Q breakfast  . FLUoxetine  40 mg Oral Daily  . hydrALAZINE  50 mg Oral TID  . levothyroxine  75 mcg Oral QAC breakfast  . loratadine  10 mg Oral Daily  . magnesium oxide  400 mg Oral BID  . mometasone-formoterol  2 puff Inhalation BID  . pantoprazole  40 mg Oral Daily  . sodium chloride flush  3 mL Intravenous Q12H   acetaminophen **OR** acetaminophen, albuterol, ALPRAZolam, cloNIDine, HYDROcodone-acetaminophen, labetalol, ondansetron **OR** ondansetron (ZOFRAN) IV, senna  Assessment/ Plan:  85 y.o.white female with a PMHx of asthma, atrial fibrillation, breast cancer, COPD, hypertension, who was admitted to [facility] on 09/02/2018 for evaluation of dizziness and severe hyponatremia.    1.  Hyponatremia: acute on chronic. Baseline sodium ranges from 128 to 131 since 2/19.  At this time, sodium maybe close to her baseline. Continue to hold thiazide diuretics.  Fluid restriction: 1 liter per day.  No indication for salt tabs or vasopressin agonist.   2. Hypertension: elevated. With atrial fibrillation. Rate controlled.  - diltiazem and hydralazine  - has PRN clonidine - Consider adding amlodipine   3. Hypokalemia:  - replacement.    LOS: 4 Janice Perkins 1/21/202012:31 PM

## 2018-09-06 NOTE — Care Management (Addendum)
Barrier to discharge-Sodium 127; possible discharge on 1-22.  Has a rolling walker; no DME needed.  Will notify Corene Cornea with [facility] at discharge.

## 2018-09-06 NOTE — Progress Notes (Signed)
Pt blood pressure is still labile this evening,171/74 PRN Labetalol, and Clonidine given.

## 2018-09-07 LAB — BASIC METABOLIC PANEL
Anion gap: 8 (ref 5–15)
BUN: 19 mg/dL (ref 8–23)
CALCIUM: 9.3 mg/dL (ref 8.9–10.3)
CO2: 26 mmol/L (ref 22–32)
Chloride: 92 mmol/L — ABNORMAL LOW (ref 98–111)
Creatinine, Ser: 0.93 mg/dL (ref 0.44–1.00)
GFR calc non Af Amer: 57 mL/min — ABNORMAL LOW (ref >60)
Glucose, Bld: 115 mg/dL — ABNORMAL HIGH (ref 70–99)
Potassium: 3.7 mmol/L (ref 3.5–5.1)
SODIUM: 126 mmol/L — AB (ref 135–145)

## 2018-09-07 LAB — SODIUM: Sodium: 125 mmol/L — ABNORMAL LOW (ref 135–145)

## 2018-09-07 MED ORDER — ALUM & MAG HYDROXIDE-SIMETH 200-200-20 MG/5ML PO SUSP: 15.0000 mL | ORAL | Status: DC | PRN

## 2018-09-07 MED ORDER — AMLODIPINE BESYLATE 5 MG PO TABS
5.0000 mg | ORAL_TABLET | Freq: Every day | ORAL | Status: DC
  Administered 2018-09-07 – 2018-09-08 (×2): 5 mg via ORAL
  Filled 2018-09-07 (×2): qty 1

## 2018-09-07 NOTE — Progress Notes (Signed)
[facility]  ROUNDING NOTE   Subjective:   More alert and oriented. Na 125 Husband at bedside.   Nausea overnight  Objective:  Vital signs in last 24 hours:  Temp:  [98.2 F (36.8 C)-98.5 F (36.9 C)] 98.5 F (36.9 C) (01/22 0757) Pulse Rate:  [48-68] 68 (01/22 1009) Resp:  [16-19] 19 (01/22 0757) BP: (100-174)/(59-107) 169/73 (01/22 1009) SpO2:  [94 %-100 %] 98 % (01/22 1004) Weight:  [67.4 kg] 67.4 kg (01/22 0434)  Weight change: 0 kg Filed Weights   09/05/18 0436 09/06/18 0432 09/07/18 0434  Weight: 67.3 kg 67.4 kg 67.4 kg    Intake/Output: I/O last 3 completed shifts: In: 3 [I.V.:3] Out: 1050 [Urine:1050]   Intake/Output this shift:  Total I/O In: -  Out: 250 [Urine:250]  Physical Exam: General: No acute distress  Head: Normocephalic, atraumatic. Moist oral mucosal membranes  Eyes: Anicteric  Neck: Supple, trachea midline  Lungs:  Clear to auscultation, normal effort  Heart: S1S2 no rubs  Abdomen:  Soft, nontender, bowel sounds present  Extremities: No peripheral edema.  Neurologic: Awake, alert, following commands, confused.   Skin: No lesions       Basic Metabolic Panel: Recent Labs  Lab 09/03/18 0346  09/03/18 1713  09/04/18 0154  09/05/18 0408  09/06/18 0157 09/06/18 0747 09/06/18 2042 09/07/18 0335 09/07/18 0746  NA 112*   < > 118*   < > 121*   < > 127*   < > 126* 127* 126* 126* 125*  K 3.8  --  3.7  --  4.4  --  3.9  --  3.4*  --   --  3.7  --   CL 80*  --  87*  --  89*  --  96*  --  94*  --   --  92*  --   CO2 22  --  22  --  26  --  23  --  25  --   --  26  --   GLUCOSE 104*  --  107*  --  107*  --  111*  --  118*  --   --  115*  --   BUN 8  --  10  --  12  --  21  --  20  --   --  19  --   CREATININE 0.58  --  0.84  --  0.94  --  0.88  --  0.90  --   --  0.93  --   CALCIUM 8.7*  --  8.9  --  9.0  --  8.9  --  8.8*  --   --  9.3  --   MG 1.8  --  2.2  --   --   --   --   --   --   --   --   --   --    < > = values in  this interval not displayed.    Liver Function Tests: Recent Labs  Lab 09/02/18 0913 09/03/18 0001  AST 44* 43*  ALT 29 30  ALKPHOS 60 55  BILITOT 1.3* 1.2  PROT 7.5 7.1  ALBUMIN 4.2 4.0   No results for input(s): LIPASE, AMYLASE in the last 168 hours. No results for input(s): AMMONIA in the last 168 hours.  CBC: Recent Labs  Lab 09/02/18 0913 09/02/18 1429 09/03/18 0001 09/03/18 0346  WBC 7.2 7.0 7.3 7.8  NEUTROABS 4.7  --   --  4.5  HGB 14.1 14.1 14.6 14.9  HCT 37.6 38.6 38.9 40.3  MCV 81.4 82.7 80.9 82.4  PLT 262 268 289 266    Cardiac Enzymes: Recent Labs  Lab 09/02/18 0913  TROPONINI <0.03    BNP: Invalid input(s): POCBNP  CBG: Recent Labs  Lab 09/02/18 1140  GLUCAP 140*    Microbiology: Results for orders placed or performed during the hospital encounter of 09/02/18  MRSA PCR Screening     Status: None   Collection Time: 09/02/18 12:05 PM  Result Value Ref Range Status   MRSA by PCR NEGATIVE NEGATIVE Final    Comment:        The GeneXpert MRSA Assay (FDA approved for NASAL specimens only), is one component of a comprehensive MRSA colonization surveillance program. It is not intended to diagnose MRSA infection nor to guide or monitor treatment for MRSA infections. Performed at [facility], [address] [place], [place] [address]     Coagulation Studies: No results for input(s): LABPROT, INR in the last 72 hours.  Urinalysis: No results for input(s): COLORURINE, LABSPEC, PHURINE, GLUCOSEU, HGBUR, BILIRUBINUR, KETONESUR, PROTEINUR, UROBILINOGEN, NITRITE, LEUKOCYTESUR in the last 72 hours.  Invalid input(s): APPERANCEUR    Imaging: No results found.   Medications:    . atorvastatin  20 mg Oral QHS  . azelastine  1 spray Each Nare BID  . busPIRone  10 mg Oral BID  . cholecalciferol  1,000 Units Oral Daily  . clopidogrel  75 mg Oral Daily  . diltiazem  120 mg Oral Daily  . enoxaparin (LOVENOX) injection  40 mg  Subcutaneous Q24H  . ferrous sulfate  325 mg Oral Q breakfast  . FLUoxetine  40 mg Oral Daily  . hydrALAZINE  50 mg Oral TID  . levothyroxine  75 mcg Oral QAC breakfast  . loratadine  10 mg Oral Daily  . magnesium oxide  400 mg Oral BID  . mometasone-formoterol  2 puff Inhalation BID  . pantoprazole  40 mg Oral Daily  . sodium chloride flush  3 mL Intravenous Q12H   acetaminophen **OR** acetaminophen, albuterol, ALPRAZolam, alum & mag hydroxide-simeth, cloNIDine, HYDROcodone-acetaminophen, labetalol, ondansetron **OR** ondansetron (ZOFRAN) IV, senna  Assessment/ Plan:  85 y.o.white female with a PMHx of asthma, atrial fibrillation, breast cancer, COPD, hypertension, who was admitted to [facility] on 09/02/2018 for evaluation of dizziness and severe hyponatremia.    1.  Hyponatremia: acute on chronic. Baseline sodium ranges from 128 to 131 since 2/19.  At this time, sodium maybe close to her baseline. Reset osmostat Continue to hold thiazide diuretics.  Fluid restriction: 1 liter per day.  No indication for salt tabs or vasopressin agonist.   2. Hypertension: elevated. With atrial fibrillation. Rate controlled.  - diltiazem and hydralazine  - has PRN clonidine - start amlodipine.     LOS: 5 Faun Mcqueen 1/22/202011:59 AM

## 2018-09-07 NOTE — Progress Notes (Signed)
Dr. Brett Albino notified of low BP 109/53, per MD okay to hold hydralazine.Will continue to monitor.

## 2018-09-07 NOTE — Plan of Care (Signed)

## 2018-09-07 NOTE — Progress Notes (Signed)
[place] at [place] NAME: Joeli Fenner    MR#:  [id number]  DATE OF BIRTH:  1932/09/21  SUBJECTIVE:   States she is feeling "sick as a dog" but cannot elaborate on this very much.  States that she is very worried about her elevated blood pressures.  She feels like she gets a headache and tremors whenever her blood pressures are high.  No chest pain or shortness of breath.  No palpitations.  REVIEW OF SYSTEMS:  Review of Systems  Constitutional: Positive for malaise/fatigue. Negative for chills and fever.  HENT: Negative for congestion, ear discharge, hearing loss and nosebleeds.   Eyes: Negative for blurred vision and double vision.  Respiratory: Negative for cough, shortness of breath and wheezing.   Cardiovascular: Negative for chest pain and palpitations.  Gastrointestinal: Negative for abdominal pain, constipation, diarrhea, nausea and vomiting.  Genitourinary: Negative for dysuria.  Musculoskeletal: Negative for myalgias.  Neurological: Negative for dizziness, focal weakness, seizures, weakness and headaches.  Psychiatric/Behavioral: Negative for depression.    DRUG ALLERGIES:   Allergies  Allergen Reactions  . Diphenhydramine Hcl Rash  . Penicillins Hives    .Has patient had a PCN reaction causing immediate rash, facial/tongue/throat swelling, SOB or lightheadedness with hypotension: Unknown Has patient had a PCN reaction causing severe rash involving mucus membranes or skin necrosis: Unknown Has patient had a PCN reaction that required hospitalization: Unknown Has patient had a PCN reaction occurring within the last 10 years: Unknown If all of the above answers are "NO", then may proceed with Cephalosporin use.   . Captopril Other (See Comments)  . Enalapril Maleate     Other reaction(s): Unknown  . Tape Itching  . Terfenadine Other (See Comments)  . Augmentin [Amoxicillin-Pot Clavulanate] Rash    Has patient had a PCN  reaction causing immediate rash, facial/tongue/throat swelling, SOB or lightheadedness with hypotension: Unknown Has patient had a PCN reaction causing severe rash involving mucus membranes or skin necrosis: Unknown Has patient had a PCN reaction that required hospitalization: Unknown Has patient had a PCN reaction occurring within the last 10 years: Unknown If all of the above answers are "NO", then may proceed with Cephalosporin use.  . Biaxin [Clarithromycin] Rash, Other (See Comments) and Hives    Other reaction(s): Unknown    VITALS:  Blood pressure (!) 115/48, pulse 64, temperature 98.5 F (36.9 C), temperature source Oral, resp. rate 18, height 5' (1.524 m), weight 67.4 kg, SpO2 98 %.  PHYSICAL EXAMINATION:  Physical Exam   GENERAL:  85 y.o.-year-old patient lying in the bed with no acute distress.  EYES: Pupils equal, round, reactive to light and accommodation. No scleral icterus. Extraocular muscles intact.  HEENT: Head atraumatic, normocephalic. Oropharynx and nasopharynx clear.  NECK:  Supple, no jugular venous distention. No thyroid enlargement, no tenderness.  LUNGS: Normal breath sounds bilaterally, no wheezing, rales,rhonchi or crepitation. No use of accessory muscles of respiration.  Decreased bibasilar breath sounds CARDIOVASCULAR: RRR, S1, S2 normal. No murmurs, rubs, or gallops.  ABDOMEN: Soft, nontender, nondistended. Bowel sounds present. No organomegaly or mass.  EXTREMITIES: No pedal edema, cyanosis, or clubbing.  NEUROLOGIC: Cranial nerves II through XII are intact. +global weakness. Sensation intact. Gait not checked.  PSYCHIATRIC: The patient is alert and oriented x 3.   SKIN: No obvious rash, lesion, or ulcer.    LABORATORY PANEL:   CBC Recent Labs  Lab 09/03/18 0346  WBC 7.8  HGB 14.9  HCT 40.3  PLT 266   ------------------------------------------------------------------------------------------------------------------  Chemistries  Recent Labs    Lab 09/03/18 0001  09/03/18 1713  09/07/18 0335 09/07/18 0746  NA 110*   < > 118*   < > 126* 125*  K 3.3*   < > 3.7   < > 3.7  --   CL 77*   < > 87*   < > 92*  --   CO2 21*   < > 22   < > 26  --   GLUCOSE 114*   < > 107*   < > 115*  --   BUN 7*   < > 10   < > 19  --   CREATININE 0.55   < > 0.84   < > 0.93  --   CALCIUM 8.6*   < > 8.9   < > 9.3  --   MG  --    < > 2.2  --   --   --   AST 43*  --   --   --   --   --   ALT 30  --   --   --   --   --   ALKPHOS 55  --   --   --   --   --   BILITOT 1.2  --   --   --   --   --    < > = values in this interval not displayed.   ------------------------------------------------------------------------------------------------------------------  Cardiac Enzymes Recent Labs  Lab 09/02/18 0913  TROPONINI <0.03   ------------------------------------------------------------------------------------------------------------------  RADIOLOGY:  No results found.  EKG:   Orders placed or performed during the hospital encounter of 09/02/18  . EKG 12-Lead  . EKG 12-Lead  . ED EKG  . ED EKG  . EKG 12-Lead  . EKG 12-Lead    ASSESSMENT AND PLAN:   85 year old female with past medical history significant for chronic A. Fib not on anticoagulation, hypothyroidism, hypertension and depression presents to hospital secondary to dizziness and fatigue.  1.  Hyponatremia- likely SIADH, Na improved and has been stable off hypertonic saline. -Continue fluid restriction of 1200cc/day -Chlorthalidone discontinued -Nephrology following -Will discontinue prozac, as it can cause worsening SIADH in elderly patients  2.  Acute delirium- likely due to being in the hospital. Much improved. -Monitor  3.  Hypertension- BPs elevated this morning -Continue cardizem and hydralazine -Clonidine and IV labetalol as needed  4.  Hypothyroidism- continue Synthroid  5.  Depression and anxiety-on BuSpar. Stop Buspar due to SIADH.  6.  DVT prophylaxis-on  Lovenox   All the records are reviewed and case discussed with Care Management/Social Workerr. Management plans discussed with the patient, family and they are in agreement.  CODE STATUS: Full code  TOTAL TIME TAKING CARE OF THIS PATIENT: 35 minutes.   POSSIBLE D/C tomorrow, DEPENDING ON CLINICAL CONDITION.   Berna Spare Aimee Heldman M.D on 09/07/2018 at 2:10 PM  Between 7am to 6pm - Pager [phone]  After 6pm go to [email or web address] - password [facility]  Office  [phone]  CC: Primary care physician; [name], MD

## 2018-09-08 LAB — BASIC METABOLIC PANEL
Anion gap: 10 (ref 5–15)
BUN: 20 mg/dL (ref 8–23)
CO2: 27 mmol/L (ref 22–32)
Calcium: 9.7 mg/dL (ref 8.9–10.3)
Chloride: 93 mmol/L — ABNORMAL LOW (ref 98–111)
Creatinine, Ser: 0.85 mg/dL (ref 0.44–1.00)
GFR calc Af Amer: >60 mL/min (ref >60)
GFR calc non Af Amer: >60 mL/min (ref >60)
Glucose, Bld: 115 mg/dL — ABNORMAL HIGH (ref 70–99)
Potassium: 4.1 mmol/L (ref 3.5–5.1)
Sodium: 130 mmol/L — ABNORMAL LOW (ref 135–145)

## 2018-09-08 MED ORDER — AMLODIPINE BESYLATE 5 MG PO TABS: 5.0000 mg | ORAL_TABLET | Freq: Every day | ORAL | 0 refills | Status: DC

## 2018-09-08 NOTE — Progress Notes (Signed)
Patient requesting dose of xanax before she leaves. Per her husband she takes xanax daily. We have no record of this on her med list. Confirmed with pharmacist Nada Boozer at [facility] she has an an active prescription for this medication. Dose given prior to discharge.

## 2018-09-08 NOTE — Discharge Instructions (Signed)
It was so nice to meet you during this hospitalization!  You came into the hospital because you were weak. We found that your sodium was low. We have STOPPED the following medications, which can both make sodium low- chlorthalidone and prozac.  The kidney doctor has started a new blood pressure medication called Norvasc- please take 1 tablet daily.  Please make sure you follow-up with the kidney doctor and your primary care doctor when you leave the hospital.  Take care, Dr. Brett Albino

## 2018-09-08 NOTE — Care Management Note (Signed)
Case Management Note  Patient Details  Name: Janice Perkins MRN: [id number] Date of Birth: Jul 18, 1933  Subjective/Objective:     Discharging today with home health.  Corene Cornea with [facility] is aware of discharge.                Action/Plan:   Expected Discharge Date:  09/08/18               Expected Discharge Plan:  [place]  In-House Referral:     Discharge planning Services  CM Consult  Post Acute Care Choice:  Home Health Choice offered to:  Patient  DME Arranged:    DME Agency:     HH Arranged:  RN, PT, Social Work [employer] Agency:  [place]  Status of Service:  Completed, signed off  If discussed at [employer], dates discussed:    Additional Comments:  Elza Rafter, RN 09/08/2018, 9:26 AM

## 2018-09-08 NOTE — Progress Notes (Signed)
[facility]  ROUNDING NOTE   Subjective:   Na 130  Fluoxetine held.   Objective:  Vital signs in last 24 hours:  Temp:  [98.3 F (36.8 C)-98.6 F (37 C)] 98.6 F (37 C) (01/23 0752) Pulse Rate:  [50-74] 72 (01/23 1050) Resp:  [18-19] 18 (01/23 0752) BP: (106-165)/(48-75) 144/75 (01/23 1050) SpO2:  [97 %-100 %] 100 % (01/23 1050) Weight:  [67.8 kg] 67.8 kg (01/23 0332)  Weight change: 0.426 kg Filed Weights   09/06/18 0432 09/07/18 0434 09/08/18 0332  Weight: 67.4 kg 67.4 kg 67.8 kg    Intake/Output: I/O last 3 completed shifts: In: 61 [P.O.:720] Out: 1250 [Urine:1250]   Intake/Output this shift:  Total I/O In: 480 [P.O.:480] Out: 0   Physical Exam: General: No acute distress  Head: Normocephalic, atraumatic. Moist oral mucosal membranes  Eyes: Anicteric  Neck: Supple, trachea midline  Lungs:  Clear to auscultation, normal effort  Heart: S1S2 no rubs  Abdomen:  Soft, nontender, bowel sounds present  Extremities: No peripheral edema.  Neurologic: Awake, alert, following commands, confused.   Skin: No lesions       Basic Metabolic Panel: Recent Labs  Lab 09/03/18 0346  09/03/18 1713  09/04/18 0154  09/05/18 0408  09/06/18 0157 09/06/18 0747 09/06/18 2042 09/07/18 0335 09/07/18 0746 09/08/18 0323  NA 112*   < > 118*   < > 121*   < > 127*   < > 126* 127* 126* 126* 125* 130*  K 3.8  --  3.7  --  4.4  --  3.9  --  3.4*  --   --  3.7  --  4.1  CL 80*  --  87*  --  89*  --  96*  --  94*  --   --  92*  --  93*  CO2 22  --  22  --  26  --  23  --  25  --   --  26  --  27  GLUCOSE 104*  --  107*  --  107*  --  111*  --  118*  --   --  115*  --  115*  BUN 8  --  10  --  12  --  21  --  20  --   --  19  --  20  CREATININE 0.58  --  0.84  --  0.94  --  0.88  --  0.90  --   --  0.93  --  0.85  CALCIUM 8.7*  --  8.9  --  9.0  --  8.9  --  8.8*  --   --  9.3  --  9.7  MG 1.8  --  2.2  --   --   --   --   --   --   --   --   --   --   --    < > =  values in this interval not displayed.    Liver Function Tests: Recent Labs  Lab 09/02/18 0913 09/03/18 0001  AST 44* 43*  ALT 29 30  ALKPHOS 60 55  BILITOT 1.3* 1.2  PROT 7.5 7.1  ALBUMIN 4.2 4.0   No results for input(s): LIPASE, AMYLASE in the last 168 hours. No results for input(s): AMMONIA in the last 168 hours.  CBC: Recent Labs  Lab 09/02/18 0913 09/02/18 1429 09/03/18 0001 09/03/18 0346  WBC 7.2 7.0 7.3 7.8  NEUTROABS  4.7  --   --  4.5  HGB 14.1 14.1 14.6 14.9  HCT 37.6 38.6 38.9 40.3  MCV 81.4 82.7 80.9 82.4  PLT 262 268 289 266    Cardiac Enzymes: Recent Labs  Lab 09/02/18 0913  TROPONINI <0.03    BNP: Invalid input(s): POCBNP  CBG: Recent Labs  Lab 09/02/18 1140  GLUCAP 140*    Microbiology: Results for orders placed or performed during the hospital encounter of 09/02/18  MRSA PCR Screening     Status: None   Collection Time: 09/02/18 12:05 PM  Result Value Ref Range Status   MRSA by PCR NEGATIVE NEGATIVE Final    Comment:        The GeneXpert MRSA Assay (FDA approved for NASAL specimens only), is one component of a comprehensive MRSA colonization surveillance program. It is not intended to diagnose MRSA infection nor to guide or monitor treatment for MRSA infections. Performed at [facility], [address] [place], [place] [address]     Coagulation Studies: No results for input(s): LABPROT, INR in the last 72 hours.  Urinalysis: No results for input(s): COLORURINE, LABSPEC, PHURINE, GLUCOSEU, HGBUR, BILIRUBINUR, KETONESUR, PROTEINUR, UROBILINOGEN, NITRITE, LEUKOCYTESUR in the last 72 hours.  Invalid input(s): APPERANCEUR    Imaging: No results found.   Medications:    . amLODipine  5 mg Oral Daily  . atorvastatin  20 mg Oral QHS  . azelastine  1 spray Each Nare BID  . busPIRone  10 mg Oral BID  . cholecalciferol  1,000 Units Oral Daily  . clopidogrel  75 mg Oral Daily  . diltiazem  120 mg Oral Daily   . enoxaparin (LOVENOX) injection  40 mg Subcutaneous Q24H  . ferrous sulfate  325 mg Oral Q breakfast  . hydrALAZINE  50 mg Oral TID  . levothyroxine  75 mcg Oral QAC breakfast  . loratadine  10 mg Oral Daily  . magnesium oxide  400 mg Oral BID  . mometasone-formoterol  2 puff Inhalation BID  . pantoprazole  40 mg Oral Daily  . sodium chloride flush  3 mL Intravenous Q12H   acetaminophen **OR** acetaminophen, albuterol, ALPRAZolam, alum & mag hydroxide-simeth, cloNIDine, HYDROcodone-acetaminophen, labetalol, ondansetron **OR** ondansetron (ZOFRAN) IV, senna  Assessment/ Plan:  85 y.o.white female with a PMHx of asthma, atrial fibrillation, breast cancer, COPD, hypertension, who was admitted to [facility] on 09/02/2018 for evaluation of dizziness and severe hyponatremia.    1.  Hyponatremia: acute on chronic. Baseline sodium ranges from 128 to 131 since 2/19.  At this time, sodium maybe close to her baseline. Reset osmostat Continue to hold thiazide diuretics.  Fluid restriction: 1 liter per day.  Agree with holding SSRI  2. Hypertension: elevated. With atrial fibrillation. Rate controlled.  - amlodipine started on this admission. Continue diltiazem and hydralazine  - has PRN clonidine  Follow up appointment with Dr. Holley Raring on 1/27 at 2:40pm   LOS: [place] 1/23/202011:08 AM

## 2018-09-08 NOTE — Discharge Summary (Addendum)
[facility] [place] NAME: Janice Perkins    MR#:  [id number]  DATE OF BIRTH:  August 21, 1932  DATE OF ADMISSION:  09/02/2018   ADMITTING PHYSICIAN: Janice Mango, MD  DATE OF DISCHARGE: 09/08/18  PRIMARY CARE PHYSICIAN: Janice Harrier, MD   ADMISSION DIAGNOSIS:  Hyponatremia [E87.1] DISCHARGE DIAGNOSIS:  Active Problems:   Hyponatremia  SECONDARY DIAGNOSIS:   Past Medical History:  Diagnosis Date  . Asthma   . Atrial fibrillation ([place])   . Breast cancer ([place]) 2011  . Bronchitis 04/2015  . Cancer [facility]) 2011   breast  . COPD (chronic obstructive pulmonary disease) ([place])   . Hypertension   . Shingles    10/2015   HOSPITAL COURSE:   Janice Perkins is an 85 year old female who presented to the ED with dizziness, generalized weakness, and a mechanical fall.  In the ED, CT head was negative.  Sodium was found to be 111.  She was admitted for further management.  1.  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 -Initially treated with 3% saline -Sodium improved from 111 to 130 on the day of discharge and was stable off 3% saline -Recommended fluid restriction of 1200cc/day -Chlorthalidone and Prozac were both discontinued -Needs to f/u with nephrology on discharge  2.  Acute delirium- likely due to being in the hospital. Much improved.  3.  Hypertension -Continue cardizem and hydralazine -Clonidine as needed -Norvasc started per nephrology  4.  Hypothyroidism- continued Synthroid  5.  Depression and anxiety- Continued Buspar. Stop prozac due to SIADH. F/u with PCP for anxiety.  6.  Paroxysmal atrial fibrillation- rate controlled on diltiazem.  Not on any anticoagulation at home due to falls.  DISCHARGE CONDITIONS:  Hyponatremia Hypertension Hypothyroidism Depression Anxiety CONSULTS OBTAINED:  Treatment Team:  Janice Legato, MD Janice Door, MD DRUG ALLERGIES:   Allergies  Allergen Reactions  . Diphenhydramine Hcl Rash  .  Penicillins Hives    .Has patient had a PCN reaction causing immediate rash, facial/tongue/throat swelling, SOB or lightheadedness with hypotension: Unknown Has patient had a PCN reaction causing severe rash involving mucus membranes or skin necrosis: Unknown Has patient had a PCN reaction that required hospitalization: Unknown Has patient had a PCN reaction occurring within the last 10 years: Unknown If all of the above answers are "NO", then may proceed with Cephalosporin use.   . Captopril Other (See Comments)  . Enalapril Maleate     Other reaction(s): Unknown  . Tape Itching  . Terfenadine Other (See Comments)  . Augmentin [Amoxicillin-Pot Clavulanate] Rash    Has patient had a PCN reaction causing immediate rash, facial/tongue/throat swelling, SOB or lightheadedness with hypotension: Unknown Has patient had a PCN reaction causing severe rash involving mucus membranes or skin necrosis: Unknown Has patient had a PCN reaction that required hospitalization: Unknown Has patient had a PCN reaction occurring within the last 10 years: Unknown If all of the above answers are "NO", then may proceed with Cephalosporin use.  . Biaxin [Clarithromycin] Rash, Other (See Comments) and Hives    Other reaction(s): Unknown   DISCHARGE MEDICATIONS:   Allergies as of 09/08/2018      Reactions   Diphenhydramine Hcl Rash   Penicillins Hives   .Has patient had a PCN reaction causing immediate rash, facial/tongue/throat swelling, SOB or lightheadedness with hypotension: Unknown Has patient had a PCN reaction causing severe rash involving mucus membranes or skin necrosis: Unknown Has patient had a PCN reaction that required hospitalization: Unknown Has patient  had a PCN reaction occurring within the last 10 years: Unknown If all of the above answers are "NO", then may proceed with Cephalosporin use.   Captopril Other (See Comments)   Enalapril Maleate    Other reaction(s): Unknown   Tape Itching    Terfenadine Other (See Comments)   Augmentin [amoxicillin-pot Clavulanate] Rash   Has patient had a PCN reaction causing immediate rash, facial/tongue/throat swelling, SOB or lightheadedness with hypotension: Unknown Has patient had a PCN reaction causing severe rash involving mucus membranes or skin necrosis: Unknown Has patient had a PCN reaction that required hospitalization: Unknown Has patient had a PCN reaction occurring within the last 10 years: Unknown If all of the above answers are "NO", then may proceed with Cephalosporin use.   Biaxin [clarithromycin] Rash, Other (See Comments), Hives   Other reaction(s): Unknown      Medication List    STOP taking these medications   chlorthalidone 25 MG tablet Commonly known as:  HYGROTON   FLUoxetine 40 MG capsule Commonly known as:  PROZAC   meloxicam 7.5 MG tablet Commonly known as:  MOBIC     TAKE these medications   albuterol 108 (90 Base) MCG/ACT inhaler Commonly known as:  PROVENTIL HFA;VENTOLIN HFA Inhale 1-2 puffs into the lungs every 6 (six) hours as needed for wheezing or shortness of breath.   albuterol 1.25 MG/3ML nebulizer solution Commonly known as:  ACCUNEB Inhale 3 mLs into the lungs every 6 (six) hours as needed for wheezing.   amLODipine 5 MG tablet Commonly known as:  NORVASC Take 1 tablet (5 mg total) by mouth daily. Start taking on:  September 09, 2018   atorvastatin 20 MG tablet Commonly known as:  LIPITOR Take 20 mg by mouth at bedtime.   azelastine 0.1 % nasal spray Commonly known as:  ASTELIN Place 1 spray into both nostrils 2 (two) times daily.   busPIRone 10 MG tablet Commonly known as:  BUSPAR Take 10 mg by mouth 2 (two) times daily.   cholecalciferol 1000 units tablet Commonly known as:  VITAMIN D Take 1,000 Units by mouth daily.   cloNIDine 0.1 MG tablet Commonly known as:  CATAPRES Take 0.1 mg by mouth 2 (two) times daily as needed (SBP >170). What changed:  Another medication with  the same name was removed. Continue taking this medication, and follow the directions you see here.   clopidogrel 75 MG tablet Commonly known as:  PLAVIX Take 75 mg by mouth daily.   diazepam 5 MG tablet Commonly known as:  VALIUM Take 5 mg by mouth daily as needed for anxiety.   diltiazem 120 MG 24 hr capsule Commonly known as:  DILACOR XR Take 120 mg by mouth daily.   esomeprazole 40 MG capsule Commonly known as:  NEXIUM Take 40 mg by mouth 2 (two) times daily before a meal.   ferrous sulfate 325 (65 FE) MG tablet Take 325 mg by mouth daily with breakfast.   hydrALAZINE 50 MG tablet Commonly known as:  APRESOLINE Take 50 mg by mouth 3 (three) times daily. What changed:  Another medication with the same name was removed. Continue taking this medication, and follow the directions you see here.   HYDROcodone-acetaminophen 5-325 MG tablet Commonly known as:  NORCO/VICODIN Take 1 tablet by mouth 2 (two) times daily as needed for moderate pain or severe pain.   levothyroxine 75 MCG tablet Commonly known as:  SYNTHROID, LEVOTHROID Take 1 tablet (75 mcg total) by mouth daily before breakfast.  loratadine 10 MG tablet Commonly known as:  CLARITIN Take 10 mg by mouth daily.   magnesium oxide 400 MG tablet Commonly known as:  MAG-OX Take 400 mg by mouth 2 (two) times daily.   mometasone-formoterol 100-5 MCG/ACT Aero Commonly known as:  DULERA Inhale 2 puffs into the lungs 2 (two) times daily.   senna 8.6 MG Tabs tablet Commonly known as:  SENOKOT Take 1 tablet by mouth daily as needed for mild constipation.        DISCHARGE INSTRUCTIONS:  1.  Follow-up with PCP in 5 days 2.  Follow-up with nephrology in 1 to 2 weeks 3.  Stop chlorthalidone and Prozac, as these can both worsen hyponatremia 4.  Fluid restriction to 1200 mL/day DIET:  Cardiac diet DISCHARGE CONDITION:  Stable ACTIVITY:  Activity as tolerated OXYGEN:  Home Oxygen: No.  Oxygen Delivery: room  air DISCHARGE LOCATION:  home   If you experience worsening of your admission symptoms, develop shortness of breath, life threatening emergency, suicidal or homicidal thoughts you must seek medical attention immediately by calling 911 or calling your MD immediately  if symptoms less severe.  You Must read complete instructions/literature along with all the possible adverse reactions/side effects for all the Medicines you take and that have been prescribed to you. Take any new Medicines after you have completely understood and accpet all the possible adverse reactions/side effects.   Please note  You were cared for by a hospitalist during your hospital stay. If you have any questions about your discharge medications or the care you received while you were in the hospital after you are discharged, you can call the unit and asked to speak with the hospitalist on call if the hospitalist that took care of you is not available. Once you are discharged, your primary care physician will handle any further medical issues. Please note that NO REFILLS for any discharge medications will be authorized once you are discharged, as it is imperative that you return to your primary care physician (or establish a relationship with a primary care physician if you do not have one) for your aftercare needs so that they can reassess your need for medications and monitor your lab values.    On the day of Discharge:  VITAL SIGNS:  Blood pressure (!) 144/75, pulse 72, temperature 98.6 F (37 C), temperature source Oral, resp. rate 18, height 5' (1.524 m), weight 67.8 kg, SpO2 100 %. PHYSICAL EXAMINATION:  GENERAL:  85 y.o.-year-old patient lying in the bed with no acute distress.  EYES: Pupils equal, round, reactive to light and accommodation. No scleral icterus. Extraocular muscles intact.  HEENT: Head atraumatic, normocephalic. Oropharynx and nasopharynx clear.  NECK:  Supple, no jugular venous distention. No thyroid  enlargement, no tenderness.  LUNGS: Normal breath sounds bilaterally, no wheezing, rales,rhonchi or crepitation. No use of accessory muscles of respiration.  CARDIOVASCULAR: Irregularly irregular rhythm, regular rate, S1, S2 normal. No murmurs, rubs, or gallops.  ABDOMEN: Soft, non-tender, non-distended. Bowel sounds present. No organomegaly or mass.  EXTREMITIES: No pedal edema, cyanosis, or clubbing.  NEUROLOGIC: Cranial nerves II through XII are intact. +global weakness. Sensation intact. Gait not checked.  PSYCHIATRIC: The patient is alert and oriented x 3.  SKIN: No obvious rash, lesion, or ulcer.  DATA REVIEW:   CBC Recent Labs  Lab 09/03/18 0346  WBC 7.8  HGB 14.9  HCT 40.3  PLT 266    Chemistries  Recent Labs  Lab 09/03/18 0001  09/03/18 1713  09/08/18  0323  NA 110*   < > 118*   < > 130*  K 3.3*   < > 3.7   < > 4.1  CL 77*   < > 87*   < > 93*  CO2 21*   < > 22   < > 27  GLUCOSE 114*   < > 107*   < > 115*  BUN 7*   < > 10   < > 20  CREATININE 0.55   < > 0.84   < > 0.85  CALCIUM 8.6*   < > 8.9   < > 9.7  MG  --    < > 2.2  --   --   AST 43*  --   --   --   --   ALT 30  --   --   --   --   ALKPHOS 55  --   --   --   --   BILITOT 1.2  --   --   --   --    < > = values in this interval not displayed.     Microbiology Results  Results for orders placed or performed during the hospital encounter of 09/02/18  MRSA PCR Screening     Status: None   Collection Time: 09/02/18 12:05 PM  Result Value Ref Range Status   MRSA by PCR NEGATIVE NEGATIVE Final    Comment:        The GeneXpert MRSA Assay (FDA approved for NASAL specimens only), is one component of a comprehensive MRSA colonization surveillance program. It is not intended to diagnose MRSA infection nor to guide or monitor treatment for MRSA infections. Performed at [facility], [address], [place], [place] [address]     RADIOLOGY:  No results found.   Management plans discussed with  the patient, family and they are in agreement.  CODE STATUS: Full Code   TOTAL TIME TAKING CARE OF THIS PATIENT: 35 minutes.    Berna Spare  M.D on 09/08/2018 at 11:18 AM  Between 7am to 6pm - Pager - [phone]  After 6pm go to [email or web address] - Proofreader  [facility]  Office  [phone]  CC: Primary care physician; Janice Harrier, MD   Note: This dictation was prepared with Dragon dictation along with smaller phrase technology. Any transcriptional errors that result from this process are unintentional.

## 2018-09-13 ENCOUNTER — Other Ambulatory Visit: Payer: Self-pay

## 2018-09-13 ENCOUNTER — Emergency Department
Admission: EM | Admit: 2018-09-13 | Discharge: 2018-09-13 | Disposition: A | Discharge status: Home / Self Care | Payer: Medicare Other | Attending: Emergency Medicine | Admitting: Emergency Medicine

## 2018-09-13 DIAGNOSIS — I10 Essential (primary) hypertension: Secondary | ICD-10-CM | POA: Insufficient documentation

## 2018-09-13 DIAGNOSIS — Z79899 Other long term (current) drug therapy: Secondary | ICD-10-CM | POA: Insufficient documentation

## 2018-09-13 DIAGNOSIS — R040 Epistaxis: Secondary | ICD-10-CM | POA: Diagnosis not present

## 2018-09-13 DIAGNOSIS — Z7902 Long term (current) use of antithrombotics/antiplatelets: Secondary | ICD-10-CM | POA: Insufficient documentation

## 2018-09-13 DIAGNOSIS — J45909 Unspecified asthma, uncomplicated: Secondary | ICD-10-CM | POA: Insufficient documentation

## 2018-09-13 DIAGNOSIS — Z853 Personal history of malignant neoplasm of breast: Secondary | ICD-10-CM | POA: Insufficient documentation

## 2018-09-13 DIAGNOSIS — J449 Chronic obstructive pulmonary disease, unspecified: Secondary | ICD-10-CM | POA: Diagnosis not present

## 2018-09-13 MED ORDER — BACITRACIN ZINC 500 UNIT/GM EX OINT
TOPICAL_OINTMENT | Freq: Once | CUTANEOUS | Status: AC
  Administered 2018-09-13: 14:00:00 via TOPICAL
  Filled 2018-09-13: qty 0.9

## 2018-09-13 MED ORDER — SILVER NITRATE-POT NITRATE 75-25 % EX MISC
1.0000 | Freq: Once | CUTANEOUS | Status: AC
  Administered 2018-09-13: 1 via TOPICAL
  Filled 2018-09-13: qty 1

## 2018-09-13 NOTE — ED Provider Notes (Addendum)
[facility] Emergency Department Provider Note  ____________________________________________   First MD Initiated Contact with Patient 09/13/18 1258     (approximate)  I have reviewed the triage vital signs and the nursing notes.   HISTORY  Chief Complaint Epistaxis   HPI Janice Perkins is a 85 y.o. female with a history of atrial fibrillation on Plavix was presented emergency department the right-sided nosebleed.  Patient says that she was in the hospital recently admitted for low sodium.  Denies any weakness or shaking today.  However, says that she has had nosebleed since this morning with small amount of coughing up clots.  Was placed on a nasal clamp in route and bleeding has now resolved.  Patient without any respiratory distress or other complaints.    Past Medical History:  Diagnosis Date  . Asthma   . Atrial fibrillation ([place])   . Breast cancer ([place]) 2011  . Bronchitis 04/2015  . Cancer [facility]) 2011   breast  . COPD (chronic obstructive pulmonary disease) ([place])   . Hypertension   . Shingles    10/2015    Patient Active Problem List   Diagnosis Date Noted  . Hypoxia 05/30/2018  . Leukocytosis 04/05/2018  . Pneumonia 11/15/2017  . Pleural effusion 05/16/2017  . AF (paroxysmal atrial fibrillation) ([place]) 05/16/2017  . Breast cancer ([place]) 05/16/2017  . Dependence on nocturnal oxygen therapy 05/16/2017  . HTN (hypertension) 05/16/2017  . Generalized weakness 03/07/2017  . Hyponatremia 03/07/2017  . Dehydration 03/07/2017  . UTI (urinary tract infection) 03/07/2017  . HCAP (healthcare-associated pneumonia) 01/19/2017  . AKI (acute kidney injury) ([place]) 12/18/2016  . Primary cancer of upper outer quadrant of right female breast ([place]) 04/07/2016  . Lumbar radiculopathy 03/23/2016  . Spinal stenosis, lumbar region, with neurogenic claudication 03/23/2016  . DDD (degenerative disc disease), lumbar 01/14/2015  . Lumbosacral facet joint syndrome  01/14/2015  . Sacroiliac joint dysfunction 01/14/2015  . Greater trochanteric bursitis 01/14/2015  . Bilateral occipital neuralgia 01/14/2015    Past Surgical History:  Procedure Laterality Date  . BREAST EXCISIONAL BIOPSY Right 11/29/13   two areas FAT NECROSIS  . BREAST EXCISIONAL BIOPSY Right 10/30/2009   lumpectomy - radiation  . COLONOSCOPY WITH PROPOFOL N/A 06/10/2018   Procedure: COLONOSCOPY WITH PROPOFOL;  Surgeon: Manya Silvas, MD;  Location: [facility] ENDOSCOPY;  Service: Endoscopy;  Laterality: N/A;  . ESOPHAGOGASTRODUODENOSCOPY (EGD) WITH PROPOFOL N/A 06/10/2018   Procedure: ESOPHAGOGASTRODUODENOSCOPY (EGD) WITH PROPOFOL;  Surgeon: Manya Silvas, MD;  Location: [facility] ENDOSCOPY;  Service: Endoscopy;  Laterality: N/A;  . NASAL SINUS SURGERY      Prior to Admission medications   Medication Sig Start Date End Date Taking? Authorizing Provider  albuterol (ACCUNEB) 1.25 MG/3ML nebulizer solution Inhale 3 mLs into the lungs every 6 (six) hours as needed for wheezing.     [provider]  albuterol (PROVENTIL HFA;VENTOLIN HFA) 108 (90 Base) MCG/ACT inhaler Inhale 1-2 puffs into the lungs every 6 (six) hours as needed for wheezing or shortness of breath.    [provider]  amLODipine (NORVASC) 5 MG tablet Take 1 tablet (5 mg total) by mouth daily. 09/09/18   Mayo, Pete Pelt, MD  atorvastatin (LIPITOR) 20 MG tablet Take 20 mg by mouth at bedtime.     [provider]  azelastine (ASTELIN) 0.1 % nasal spray Place 1 spray into both nostrils 2 (two) times daily.    [provider]  busPIRone (BUSPAR) 10 MG tablet Take 10 mg by  mouth 2 (two) times daily.    [provider]  cholecalciferol (VITAMIN D) 1000 units tablet Take 1,000 Units by mouth daily.    [provider]  cloNIDine (CATAPRES) 0.1 MG tablet Take 0.1 mg by mouth 2 (two) times daily as needed (SBP >170).    [provider]  clopidogrel (PLAVIX) 75 MG tablet Take 75  mg by mouth daily.    [provider]  diazepam (VALIUM) 5 MG tablet Take 5 mg by mouth daily as needed for anxiety.    [provider]  diltiazem (DILACOR XR) 120 MG 24 hr capsule Take 120 mg by mouth daily.    [provider]  esomeprazole (NEXIUM) 40 MG capsule Take 40 mg by mouth 2 (two) times daily before a meal.     [provider]  ferrous sulfate 325 (65 FE) MG tablet Take 325 mg by mouth daily with breakfast.    [provider]  hydrALAZINE (APRESOLINE) 50 MG tablet Take 50 mg by mouth 3 (three) times daily.    [provider]  HYDROcodone-acetaminophen (NORCO/VICODIN) 5-325 MG tablet Take 1 tablet by mouth 2 (two) times daily as needed for moderate pain or severe pain.     [provider]  levothyroxine (SYNTHROID, LEVOTHROID) 75 MCG tablet Take 1 tablet (75 mcg total) by mouth daily before breakfast. 12/23/16   Gouru, Illene Silver, MD  loratadine (CLARITIN) 10 MG tablet Take 10 mg by mouth daily.    [provider]  magnesium oxide (MAG-OX) 400 MG tablet Take 400 mg by mouth 2 (two) times daily.    [provider]  mometasone-formoterol (DULERA) 100-5 MCG/ACT AERO Inhale 2 puffs into the lungs 2 (two) times daily.    [provider]  senna (SENOKOT) 8.6 MG TABS tablet Take 1 tablet by mouth daily as needed for mild constipation.    [provider]    Allergies Diphenhydramine hcl; Penicillins; Captopril; Enalapril maleate; Tape; Terfenadine; Augmentin [amoxicillin-pot clavulanate]; and Biaxin [clarithromycin]  Family History  Problem Relation Age of Onset  . Hypertension Mother   . Heart disease Mother   . Cancer Mother   . Stroke Father   . Hypertension Father   . Breast cancer Sister     Social History Social History   Tobacco Use  . Smoking status: Never Smoker  . Smokeless tobacco: Never Used  Substance Use Topics  . Alcohol use: No    Alcohol/week: 0.0 standard drinks  . Drug  use: No    Review of Systems  Constitutional: No fever/chills Eyes: No visual changes. ENT: As above Cardiovascular: Denies chest pain. Respiratory: Denies shortness of breath. Gastrointestinal: No abdominal pain.  No nausea, no vomiting.  No diarrhea.  No constipation. Genitourinary: Negative for dysuria. Musculoskeletal: Negative for back pain. Skin: Negative for rash. Neurological: Negative for headaches, focal weakness or numbness.   ____________________________________________   PHYSICAL EXAM:  VITAL SIGNS: ED Triage Vitals  Enc Vitals Group     BP 09/13/18 1253 (!) 166/78     Pulse Rate 09/13/18 1253 65     Resp 09/13/18 1253 18     Temp 09/13/18 1253 97.6 F (36.4 C)     Temp Source 09/13/18 1253 Oral     SpO2 09/13/18 1253 100 %     Weight 09/13/18 1246 147 lb 11.3 oz (67 kg)     Height 09/13/18 1246 5\' 1"  (1.549 m)     Head Circumference --      Peak  Flow --      Pain Score 09/13/18 1252 0     Pain Loc --      Pain Edu? --      Excl. in [place]? --     Constitutional: Alert and oriented. Well appearing and in no acute distress. Eyes: Conjunctivae are normal.  Head: Atraumatic. Nose: No congestion/rhinnorhea.  No active bleeding at this time.  However, to the right nare there is a small amount of crusted blood as well as several droplets of blood in areas that are very mildly oozing to the anterior septum on the right. Mouth/Throat: Mucous membranes are moist.  No blood in the pharynx. Neck: No stridor.   Cardiovascular: Normal rate, regular rhythm. Grossly normal heart sounds.   Respiratory: Normal respiratory effort.  No retractions. Lungs CTAB. Gastrointestinal: Soft and nontender. No distention.  Musculoskeletal: No lower extremity tenderness nor edema.  No joint effusions. Neurologic:  Normal speech and language. No gross focal neurologic deficits are appreciated. Skin:  Skin is warm, dry and intact. No rash noted. Psychiatric: Mood and affect are normal.  Speech and behavior are normal.  ____________________________________________   LABS (all labs ordered are listed, but only abnormal results are displayed)  Labs Reviewed - No data to display ____________________________________________  EKG   ____________________________________________  RADIOLOGY   ____________________________________________   PROCEDURES  Procedure(s) performed:   .Epistaxis Management Date/Time: 09/13/2018 1:16 PM Performed by: Orbie Pyo, MD Authorized by: Orbie Pyo, MD   Consent:    Consent obtained:  Verbal   Consent given by:  Patient Anesthesia (see MAR for exact dosages):    Anesthesia method:  None Procedure details:    Treatment site:  R anterior   Treatment method:  Silver nitrate   Treatment complexity:  Limited   Treatment episode: initial   Post-procedure details:    Assessment:  Bleeding stopped   Patient tolerance of procedure:  Tolerated well, no immediate complications Comments:     Bacitracin to the right ear status post procedure.    Critical Care performed:    ____________________________________________   INITIAL IMPRESSION / ASSESSMENT AND PLAN / ED COURSE  Pertinent labs & imaging results that were available during my care of the patient were reviewed by me and considered in my medical decision making (see chart for details).  DDX: Coagulopathy, anemia, anterior epistaxis, As part of my medical decision making, I reviewed the following data within the [facility] Notes from prior ED visits  ----------------------------------------- 1:17 PM on 09/13/2018 -----------------------------------------  No active bleeding post procedure.  No complaints for the patient.  Recommended that she continue to use twice a day Vaseline or antibacterial ointment to the bilateral nares to prevent cracking or dry nasal mucosa.  She is understanding of the diagnosis well treatment and  willing to comply.  Patient to be discharged at this time. ____________________________________________   FINAL CLINICAL IMPRESSION(S) / ED DIAGNOSES  Anterior epistaxes.  NEW MEDICATIONS STARTED DURING THIS VISIT:  New Prescriptions   No medications on file     Note:  This document was prepared using Dragon voice recognition software and may include unintentional dictation errors.     Orbie Pyo, MD 09/13/18 1318  Patient had return to the emergency department because of recurrence of bleeding.  However, by the time I reexamined her bleeding it stopped.  Despite this, I kept the nasal clamp on for approximately 30 minutes and observed her for approximately 2-1/2 hours without rebleeding.  No change in discharge information.  Patient be discharged home at this time.    Orbie Pyo, MD 09/13/18 [phone]

## 2018-09-13 NOTE — ED Notes (Signed)
AAOx3.  Skin warm and dry.  NAD 

## 2018-09-13 NOTE — ED Provider Notes (Signed)
Patient called back in because of persistent nasal bleeding.  However, when the call was patched through to me I held for several minutes.  I could hear the patient in the background but it appears that she has placed a phone down.  I continue to say hello this is Dr. Clearnce Hasten but received no response.    Orbie Pyo, MD 09/13/18 [phone]

## 2018-09-13 NOTE — ED Triage Notes (Signed)
Pt arrived via EMS from home d/t nose bleed. Pt is A&O x4 at this time with controlled bleeding.

## 2018-09-16 ENCOUNTER — Emergency Department: Payer: Medicare Other

## 2018-09-16 ENCOUNTER — Other Ambulatory Visit: Payer: Self-pay

## 2018-09-16 ENCOUNTER — Encounter: Payer: Self-pay | Admitting: Intensive Care

## 2018-09-16 ENCOUNTER — Emergency Department
Admission: EM | Admit: 2018-09-16 | Discharge: 2018-09-16 | Disposition: A | Discharge status: Home / Self Care | Payer: Medicare Other | Attending: Student in an Organized Health Care Education/Training Program | Admitting: Student in an Organized Health Care Education/Training Program

## 2018-09-16 DIAGNOSIS — R42 Dizziness and giddiness: Secondary | ICD-10-CM | POA: Diagnosis not present

## 2018-09-16 DIAGNOSIS — Z79899 Other long term (current) drug therapy: Secondary | ICD-10-CM | POA: Insufficient documentation

## 2018-09-16 DIAGNOSIS — J45909 Unspecified asthma, uncomplicated: Secondary | ICD-10-CM | POA: Insufficient documentation

## 2018-09-16 DIAGNOSIS — I10 Essential (primary) hypertension: Secondary | ICD-10-CM | POA: Diagnosis not present

## 2018-09-16 LAB — BASIC METABOLIC PANEL
Anion gap: 7 (ref 5–15)
BUN: 12 mg/dL (ref 8–23)
CO2: 28 mmol/L (ref 22–32)
Calcium: 9.3 mg/dL (ref 8.9–10.3)
Chloride: 97 mmol/L — ABNORMAL LOW (ref 98–111)
Creatinine, Ser: 0.79 mg/dL (ref 0.44–1.00)
GFR calc Af Amer: >60 mL/min (ref >60)
GFR calc non Af Amer: >60 mL/min (ref >60)
Glucose, Bld: 117 mg/dL — ABNORMAL HIGH (ref 70–99)
Potassium: 3.7 mmol/L (ref 3.5–5.1)
Sodium: 132 mmol/L — ABNORMAL LOW (ref 135–145)

## 2018-09-16 LAB — URINALYSIS, COMPLETE (UACMP) WITH MICROSCOPIC
Bacteria, UA: NONE SEEN
Bilirubin Urine: NEGATIVE
Glucose, UA: NEGATIVE mg/dL
Hgb urine dipstick: NEGATIVE
Ketones, ur: NEGATIVE mg/dL
Leukocytes, UA: NEGATIVE
Nitrite: NEGATIVE
Protein, ur: NEGATIVE mg/dL
Specific Gravity, Urine: 1.005 (ref 1.005–1.030)
pH: 7 (ref 5.0–8.0)

## 2018-09-16 LAB — CBC
HCT: 34.9 % — ABNORMAL LOW (ref 36.0–46.0)
Hemoglobin: 11.7 g/dL — ABNORMAL LOW (ref 12.0–15.0)
MCH: 30.5 pg (ref 26.0–34.0)
MCHC: 33.5 g/dL (ref 30.0–36.0)
MCV: 90.9 fL (ref 80.0–100.0)
NRBC: 0 % (ref 0.0–0.2)
Platelets: 240 10*3/uL (ref 150–400)
RBC: 3.84 MIL/uL — ABNORMAL LOW (ref 3.87–5.11)
RDW: 14.1 % (ref 11.5–15.5)
WBC: 7.2 10*3/uL (ref 4.0–10.5)

## 2018-09-16 MED ORDER — SODIUM CHLORIDE 0.9 % IV BOLUS
250.0000 mL | Freq: Once | INTRAVENOUS | Status: AC
  Administered 2018-09-16: 250 mL via INTRAVENOUS

## 2018-09-16 MED ORDER — MECLIZINE HCL 25 MG PO TABS
25.0000 mg | ORAL_TABLET | Freq: Once | ORAL | Status: AC
  Administered 2018-09-16: 25 mg via ORAL
  Filled 2018-09-16: qty 1

## 2018-09-16 MED ORDER — MECLIZINE HCL 25 MG PO TABS: 25.0000 mg | ORAL_TABLET | Freq: Two times a day (BID) | ORAL | 0 refills | Status: DC | PRN

## 2018-09-16 NOTE — ED Triage Notes (Signed)
Patient discharged from [facility] on Friday. Was admitted for K, NA, HBG issues and HTN. Patient reports she was feeling dizzy last night around 11-12pm and then checked blood pressure which was high. Took two clonopin this morning for a b/p of 190s. Patient c/o dizziness at this time. No vision changes. Reports "my head is just swimmy"

## 2018-09-16 NOTE — ED Notes (Addendum)
Pt states she became dizzy last night, checked BP was elevated through out night. Took 2 klonopin before arrival. Recent admission for hyponatremia. States has followed up with PCP and nephrology and sodium 2 days ago was 131. Denies being a diabetic. Pt A&O, ambulated from wheelchair to stretcher. No distress noted. Husband at bedside.

## 2018-09-16 NOTE — ED Notes (Signed)
MD in room to assess patient.  Will continue to monitor.   

## 2018-09-16 NOTE — ED Provider Notes (Signed)
[facility] Emergency Department Provider Note    First MD Initiated Contact with Patient 09/16/18 1014     (approximate)  I have reviewed the triage vital signs and the nursing notes.   HISTORY  Chief Complaint Dizziness    HPI Janice Perkins is a 85 y.o. female [facility] visit to the ER for epistaxis on Plavix presents the ER for dizziness particular with movement and concern for elevated blood pressure.  States that she started feeling dizzy last night and similar to previous episodes of vertigo that she has had.  She took meclizine with some improvement was able to rest but woke up this morning with persistent symptoms checked her blood pressure and it was 093 systolic so she took 2 of her clonidine pills.  She called her PCP for evaluation but was directed to the ER for reevaluation.    Past Medical History:  Diagnosis Date  . Asthma   . Atrial fibrillation ([place])   . Breast cancer ([place]) 2011  . Bronchitis 04/2015  . Cancer [facility]) 2011   breast  . COPD (chronic obstructive pulmonary disease) ([place])   . Hypertension   . Shingles    10/2015   Family History  Problem Relation Age of Onset  . Hypertension Mother   . Heart disease Mother   . Cancer Mother   . Stroke Father   . Hypertension Father   . Breast cancer Sister    Past Surgical History:  Procedure Laterality Date  . BREAST EXCISIONAL BIOPSY Right 11/29/13   two areas FAT NECROSIS  . BREAST EXCISIONAL BIOPSY Right 10/30/2009   lumpectomy - radiation  . COLONOSCOPY WITH PROPOFOL N/A 06/10/2018   Procedure: COLONOSCOPY WITH PROPOFOL;  Surgeon: Manya Silvas, MD;  Location: [facility] ENDOSCOPY;  Service: Endoscopy;  Laterality: N/A;  . ESOPHAGOGASTRODUODENOSCOPY (EGD) WITH PROPOFOL N/A 06/10/2018   Procedure: ESOPHAGOGASTRODUODENOSCOPY (EGD) WITH PROPOFOL;  Surgeon: Manya Silvas, MD;  Location: [facility] ENDOSCOPY;  Service: Endoscopy;   Laterality: N/A;  . NASAL SINUS SURGERY     Patient Active Problem List   Diagnosis Date Noted  . Hypoxia 05/30/2018  . Leukocytosis 04/05/2018  . Pneumonia 11/15/2017  . Pleural effusion 05/16/2017  . AF (paroxysmal atrial fibrillation) ([place]) 05/16/2017  . Breast cancer ([place]) 05/16/2017  . Dependence on nocturnal oxygen therapy 05/16/2017  . HTN (hypertension) 05/16/2017  . Generalized weakness 03/07/2017  . Hyponatremia 03/07/2017  . Dehydration 03/07/2017  . UTI (urinary tract infection) 03/07/2017  . HCAP (healthcare-associated pneumonia) 01/19/2017  . AKI (acute kidney injury) ([place]) 12/18/2016  . Primary cancer of upper outer quadrant of right female breast ([place]) 04/07/2016  . Lumbar radiculopathy 03/23/2016  . Spinal stenosis, lumbar region, with neurogenic claudication 03/23/2016  . DDD (degenerative disc disease), lumbar 01/14/2015  . Lumbosacral facet joint syndrome 01/14/2015  . Sacroiliac joint dysfunction 01/14/2015  . Greater trochanteric bursitis 01/14/2015  . Bilateral occipital neuralgia 01/14/2015      Prior to Admission medications   Medication Sig Start Date End Date Taking? Authorizing Provider  albuterol (ACCUNEB) 1.25 MG/3ML nebulizer solution Inhale 3 mLs into the lungs every 6 (six) hours as needed for wheezing.    Yes [provider]  ALPRAZolam Duanne Moron) 0.5 MG tablet Take 0.5 mg by mouth 2 (two) times daily as needed for anxiety. 09/09/18 10/09/18 Yes [provider]  amLODipine (NORVASC) 5 MG tablet Take 1 tablet (5 mg total) by mouth daily. 09/09/18  Yes  Mayo, Pete Pelt, MD  atorvastatin (LIPITOR) 20 MG tablet Take 20 mg by mouth at bedtime.    Yes [provider]  azelastine (ASTELIN) 0.1 % nasal spray Place 1 spray into both nostrils 2 (two) times daily.   Yes [provider]  busPIRone (BUSPAR) 10 MG tablet Take 10 mg by mouth 2 (two) times daily.   Yes [provider]  cholecalciferol (VITAMIN D) 1000  units tablet Take 1,000 Units by mouth daily.   Yes [provider]  cloNIDine (CATAPRES) 0.1 MG tablet Take 0.1 mg by mouth 2 (two) times daily as needed (SBP >170).   Yes [provider]  diazepam (VALIUM) 5 MG tablet Take 5 mg by mouth daily as needed for anxiety.   Yes [provider]  diltiazem (DILACOR XR) 120 MG 24 hr capsule Take 120 mg by mouth daily.   Yes [provider]  esomeprazole (NEXIUM) 40 MG capsule Take 40 mg by mouth 2 (two) times daily before a meal.    Yes [provider]  ferrous sulfate 325 (65 FE) MG tablet Take 325 mg by mouth daily with breakfast.   Yes [provider]  fluticasone (FLONASE) 50 MCG/ACT nasal spray Place 1 spray into both nostrils daily.   Yes [provider]  hydrALAZINE (APRESOLINE) 50 MG tablet Take 50 mg by mouth 3 (three) times daily.   Yes [provider]  HYDROcodone-acetaminophen (NORCO/VICODIN) 5-325 MG tablet Take 1 tablet by mouth 2 (two) times daily as needed for moderate pain or severe pain.    Yes [provider]  levothyroxine (SYNTHROID, LEVOTHROID) 50 MCG tablet Take 50 mcg by mouth daily before breakfast.   Yes [provider]  loratadine (CLARITIN) 10 MG tablet Take 10 mg by mouth daily.   Yes [provider]  magnesium oxide (MAG-OX) 400 MG tablet Take 400 mg by mouth 2 (two) times daily.   Yes [provider]  mometasone-formoterol (DULERA) 100-5 MCG/ACT AERO Inhale 2 puffs into the lungs 2 (two) times daily.   Yes [provider]  senna (SENOKOT) 8.6 MG TABS tablet Take 1 tablet by mouth daily as needed for mild constipation.   Yes [provider]  albuterol (PROVENTIL HFA;VENTOLIN HFA) 108 (90 Base) MCG/ACT inhaler Inhale 1-2 puffs into the lungs every 6 (six) hours as needed for wheezing or shortness of breath.    [provider]  levothyroxine (SYNTHROID, LEVOTHROID) 75 MCG tablet Take 1 tablet (75  mcg total) by mouth daily before breakfast. Patient not taking: Reported on 09/16/2018 12/23/16   Nicholes Mango, MD  meclizine (ANTIVERT) 25 MG tablet Take 1 tablet (25 mg total) by mouth 2 (two) times daily as needed for dizziness. 09/16/18   Merlyn Lot, MD    Allergies Diphenhydramine hcl; Penicillins; Captopril; Enalapril maleate; Tape; Terfenadine; Augmentin [amoxicillin-pot clavulanate]; and Biaxin [clarithromycin]    Social History Social History   Tobacco Use  . Smoking status: Never Smoker  . Smokeless tobacco: Never Used  Substance Use Topics  . Alcohol use: No    Alcohol/week: 0.0 standard drinks  . Drug use: No    Review of Systems Patient denies headaches, rhinorrhea, blurry vision, numbness, shortness of breath, chest pain, edema, cough, abdominal pain, nausea, vomiting, diarrhea, dysuria, fevers, rashes or hallucinations unless otherwise stated above in HPI. ____________________________________________   PHYSICAL EXAM:  VITAL SIGNS: Vitals:   09/16/18 1300 09/16/18 1330  BP: 135/73 138/60  Pulse: 62 62  Resp: 17 18  Temp:    SpO2: 95% 98%    Constitutional: Alert and oriented.  Eyes: Conjunctivae are normal.  Head: Atraumatic. Nose: No congestion/rhinnorhea. Mouth/Throat: Mucous membranes are moist.   Neck: No stridor. Painless ROM.  Cardiovascular: Normal rate, regular rhythm. Grossly normal heart sounds.  Good peripheral circulation. Respiratory: Normal respiratory effort.  No retractions. Lungs CTAB. Gastrointestinal: Soft and nontender. No distention. No abdominal bruits. No CVA tenderness. Genitourinary:  Musculoskeletal: No lower extremity tenderness nor edema.  No joint effusions. Neurologic:  CN- intact.  No facial droop, Normal FNF.  Normal heel to shin.  Sensation intact bilaterally. Normal speech and language. No gross focal neurologic deficits are appreciated. No gait instability. Skin:  Skin is warm, dry and intact. No rash  noted. Psychiatric: Mood and affect are normal. Speech and behavior are normal.  ____________________________________________   LABS (all labs ordered are listed, but only abnormal results are displayed)  Results for orders placed or performed during the hospital encounter of 09/16/18 (from the past 24 hour(s))  Basic metabolic panel     Status: Abnormal   Collection Time: 09/16/18 10:24 AM  Result Value Ref Range   Sodium 132 (L) 135 - 145 mmol/L   Potassium 3.7 3.5 - 5.1 mmol/L   Chloride 97 (L) 98 - 111 mmol/L   CO2 28 22 - 32 mmol/L   Glucose, Bld 117 (H) 70 - 99 mg/dL   BUN 12 8 - 23 mg/dL   Creatinine, Ser 0.79 0.44 - 1.00 mg/dL   Calcium 9.3 8.9 - 10.3 mg/dL   GFR calc non Af Amer >60 >60 mL/min   GFR calc Af Amer >60 >60 mL/min   Anion gap 7 5 - 15  CBC     Status: Abnormal   Collection Time: 09/16/18 10:24 AM  Result Value Ref Range   WBC 7.2 4.0 - 10.5 K/uL   RBC 3.84 (L) 3.87 - 5.11 MIL/uL   Hemoglobin 11.7 (L) 12.0 - 15.0 g/dL   HCT 34.9 (L) 36.0 - 46.0 %   MCV 90.9 80.0 - 100.0 fL   MCH 30.5 26.0 - 34.0 pg   MCHC 33.5 30.0 - 36.0 g/dL   RDW 14.1 11.5 - 15.5 %   Platelets 240 150 - 400 K/uL   nRBC 0.0 0.0 - 0.2 %  Urinalysis, Complete w Microscopic     Status: Abnormal   Collection Time: 09/16/18 12:49 PM  Result Value Ref Range   Color, Urine STRAW (A) YELLOW   APPearance CLEAR (A) CLEAR   Specific Gravity, Urine 1.005 1.005 - 1.030   pH 7.0 5.0 - 8.0   Glucose, UA NEGATIVE NEGATIVE mg/dL   Hgb urine dipstick NEGATIVE NEGATIVE   Bilirubin Urine NEGATIVE NEGATIVE   Ketones, ur NEGATIVE NEGATIVE mg/dL   Protein, ur NEGATIVE NEGATIVE mg/dL   Nitrite NEGATIVE NEGATIVE   Leukocytes, UA NEGATIVE NEGATIVE   WBC, UA 0-5 0 - 5 WBC/hpf   Bacteria, UA NONE SEEN NONE SEEN   Squamous Epithelial / LPF 0-5 0 - 5   ____________________________________________  EKG My review and personal interpretation at Time: 10:23   Indication: dizziness  Rate: 60  Rhythm:  afib Axis: normal Other: normal intervals, no stemi, abnml ekg ____________________________________________  RADIOLOGY   ____________________________________________   PROCEDURES  Procedure(s) performed:  Procedures    Critical Care performed: no ____________________________________________   INITIAL IMPRESSION / ASSESSMENT AND PLAN / ED COURSE  Pertinent labs & imaging results that were available during my care of the  patient were reviewed by me and considered in my medical decision making (see chart for details).   DDX: vertigo, dehydration, electrolyte abn, doubt cva, htnive crisis or urgency  Janice Perkins is a 85 y.o. who presents to the ED with symptoms as described above.  Seems primarily peripheral vertiginous symptoms.  Will give symptomatic treatment as well as IV fluids.  Will check blood work for the but differential.  Clinical Course as of Sep 17 1427  Fri Sep 16, 2018  1325 Patient with significant provement after meclizine but still feeling some dizziness with standing.  Given her history of A. fib not currently on a Plavix due to recent nosebleed will order MRI to exclude posterior CVA.   [PR]  0076 MRI is reassuring.  No evidence of CVA mass or other acute abnormality.  This more consistent with peripheral vertigo.  She is able to ambulate with steady gait.  Symptoms improved.  Have discussed with the patient and available family all diagnostics and treatments performed thus far and all questions were answered to the best of my ability. The patient demonstrates understanding and agreement with plan.    [PR]    Clinical Course User Index [PR] Merlyn Lot, MD     As part of my medical decision making, I reviewed the following data within the [place] notes reviewed and incorporated, Labs reviewed, notes from prior ED visits and [place] Controlled Substance Database   ____________________________________________   FINAL  CLINICAL IMPRESSION(S) / ED DIAGNOSES  Final diagnoses:  Vertigo      NEW MEDICATIONS STARTED DURING THIS VISIT:  New Prescriptions   MECLIZINE (ANTIVERT) 25 MG TABLET    Take 1 tablet (25 mg total) by mouth 2 (two) times daily as needed for dizziness.     Note:  This document was prepared using Dragon voice recognition software and may include unintentional dictation errors.    Merlyn Lot, MD 09/16/18 [phone]

## 2018-09-16 NOTE — ED Notes (Signed)
Patient transported to MRI 

## 2018-12-11 ENCOUNTER — Encounter: Payer: Self-pay | Admitting: Internal Medicine

## 2018-12-11 ENCOUNTER — Emergency Department: Payer: Medicare Other

## 2018-12-11 ENCOUNTER — Inpatient Hospital Stay
Admission: EM | Admit: 2018-12-11 | Discharge: 2018-12-13 | DRG: 065 | Disposition: A | Discharge status: Home Health | Payer: Medicare Other | Attending: Internal Medicine | Admitting: Internal Medicine

## 2018-12-11 ENCOUNTER — Inpatient Hospital Stay: Payer: Medicare Other

## 2018-12-11 ENCOUNTER — Other Ambulatory Visit: Payer: Self-pay

## 2018-12-11 DIAGNOSIS — Z803 Family history of malignant neoplasm of breast: Secondary | ICD-10-CM

## 2018-12-11 DIAGNOSIS — Z823 Family history of stroke: Secondary | ICD-10-CM

## 2018-12-11 DIAGNOSIS — Z20828 Contact with and (suspected) exposure to other viral communicable diseases: Secondary | ICD-10-CM | POA: Diagnosis present

## 2018-12-11 DIAGNOSIS — R29700 NIHSS score 0: Secondary | ICD-10-CM | POA: Diagnosis present

## 2018-12-11 DIAGNOSIS — Z888 Allergy status to other drugs, medicaments and biological substances status: Secondary | ICD-10-CM

## 2018-12-11 DIAGNOSIS — Z9981 Dependence on supplemental oxygen: Secondary | ICD-10-CM | POA: Diagnosis not present

## 2018-12-11 DIAGNOSIS — I482 Chronic atrial fibrillation, unspecified: Secondary | ICD-10-CM | POA: Diagnosis present

## 2018-12-11 DIAGNOSIS — I674 Hypertensive encephalopathy: Secondary | ICD-10-CM | POA: Diagnosis present

## 2018-12-11 DIAGNOSIS — Z881 Allergy status to other antibiotic agents status: Secondary | ICD-10-CM | POA: Diagnosis not present

## 2018-12-11 DIAGNOSIS — R402142 Coma scale, eyes open, spontaneous, at arrival to emergency department: Secondary | ICD-10-CM | POA: Diagnosis present

## 2018-12-11 DIAGNOSIS — Z853 Personal history of malignant neoplasm of breast: Secondary | ICD-10-CM | POA: Diagnosis not present

## 2018-12-11 DIAGNOSIS — I639 Cerebral infarction, unspecified: Secondary | ICD-10-CM

## 2018-12-11 DIAGNOSIS — R402362 Coma scale, best motor response, obeys commands, at arrival to emergency department: Secondary | ICD-10-CM | POA: Diagnosis present

## 2018-12-11 DIAGNOSIS — J449 Chronic obstructive pulmonary disease, unspecified: Secondary | ICD-10-CM | POA: Diagnosis present

## 2018-12-11 DIAGNOSIS — Z8249 Family history of ischemic heart disease and other diseases of the circulatory system: Secondary | ICD-10-CM

## 2018-12-11 DIAGNOSIS — R402242 Coma scale, best verbal response, confused conversation, at arrival to emergency department: Secondary | ICD-10-CM | POA: Diagnosis present

## 2018-12-11 DIAGNOSIS — R4182 Altered mental status, unspecified: Secondary | ICD-10-CM

## 2018-12-11 DIAGNOSIS — Z79890 Hormone replacement therapy (postmenopausal): Secondary | ICD-10-CM

## 2018-12-11 DIAGNOSIS — E039 Hypothyroidism, unspecified: Secondary | ICD-10-CM | POA: Diagnosis present

## 2018-12-11 DIAGNOSIS — I63412 Cerebral infarction due to embolism of left middle cerebral artery: Secondary | ICD-10-CM | POA: Diagnosis not present

## 2018-12-11 DIAGNOSIS — I10 Essential (primary) hypertension: Secondary | ICD-10-CM | POA: Diagnosis present

## 2018-12-11 DIAGNOSIS — F419 Anxiety disorder, unspecified: Secondary | ICD-10-CM | POA: Diagnosis present

## 2018-12-11 DIAGNOSIS — E785 Hyperlipidemia, unspecified: Secondary | ICD-10-CM | POA: Diagnosis present

## 2018-12-11 DIAGNOSIS — J40 Bronchitis, not specified as acute or chronic: Secondary | ICD-10-CM

## 2018-12-11 LAB — CBC WITH DIFFERENTIAL/PLATELET
Abs Immature Granulocytes: 0.27 10*3/uL — ABNORMAL HIGH (ref 0.00–0.07)
Basophils Absolute: 0 10*3/uL (ref 0.0–0.1)
Basophils Relative: 1 %
Eosinophils Absolute: 0.2 10*3/uL (ref 0.0–0.5)
Eosinophils Relative: 3 %
HCT: 43.7 % (ref 36.0–46.0)
Hemoglobin: 15 g/dL (ref 12.0–15.0)
Immature Granulocytes: 4 %
Lymphocytes Relative: 27 %
Lymphs Abs: 1.7 10*3/uL (ref 0.7–4.0)
MCH: 31.6 pg (ref 26.0–34.0)
MCHC: 34.3 g/dL (ref 30.0–36.0)
MCV: 92.2 fL (ref 80.0–100.0)
Monocytes Absolute: 0.7 10*3/uL (ref 0.1–1.0)
Monocytes Relative: 11 %
Neutro Abs: 3.3 10*3/uL (ref 1.7–7.7)
Neutrophils Relative %: 54 %
Platelets: 192 10*3/uL (ref 150–400)
RBC: 4.74 MIL/uL (ref 3.87–5.11)
RDW: 13.5 % (ref 11.5–15.5)
WBC: 6.1 10*3/uL (ref 4.0–10.5)
nRBC: 0 % (ref 0.0–0.2)

## 2018-12-11 LAB — COMPREHENSIVE METABOLIC PANEL
ALT: 22 U/L (ref 0–44)
AST: 27 U/L (ref 15–41)
Albumin: 3.8 g/dL (ref 3.5–5.0)
Alkaline Phosphatase: 57 U/L (ref 38–126)
Anion gap: 8 (ref 5–15)
BUN: 20 mg/dL (ref 8–23)
CO2: 28 mmol/L (ref 22–32)
Calcium: 9.4 mg/dL (ref 8.9–10.3)
Chloride: 100 mmol/L (ref 98–111)
Creatinine, Ser: 0.92 mg/dL (ref 0.44–1.00)
GFR calc Af Amer: >60 mL/min (ref >60)
GFR calc non Af Amer: 57 mL/min — ABNORMAL LOW (ref >60)
Glucose, Bld: 110 mg/dL — ABNORMAL HIGH (ref 70–99)
Potassium: 3.9 mmol/L (ref 3.5–5.1)
Sodium: 136 mmol/L (ref 135–145)
Total Bilirubin: 0.8 mg/dL (ref 0.3–1.2)
Total Protein: 7.3 g/dL (ref 6.5–8.1)

## 2018-12-11 LAB — TROPONIN I: Troponin I: <0.03 ng/mL (ref <0.03)

## 2018-12-11 LAB — URINALYSIS, COMPLETE (UACMP) WITH MICROSCOPIC
Bacteria, UA: NONE SEEN
Bilirubin Urine: NEGATIVE
Glucose, UA: NEGATIVE mg/dL
Hgb urine dipstick: NEGATIVE
Ketones, ur: NEGATIVE mg/dL
Leukocytes,Ua: NEGATIVE
Nitrite: NEGATIVE
Protein, ur: NEGATIVE mg/dL
Specific Gravity, Urine: 1.008 (ref 1.005–1.030)
pH: 8 (ref 5.0–8.0)

## 2018-12-11 LAB — TSH: TSH: 2.068 u[IU]/mL (ref 0.350–4.500)

## 2018-12-11 LAB — PROCALCITONIN: Procalcitonin: <0.1 ng/mL

## 2018-12-11 LAB — SARS CORONAVIRUS 2 BY RT PCR (HOSPITAL ORDER, PERFORMED IN ~~LOC~~ HOSPITAL LAB): SARS Coronavirus 2: NEGATIVE

## 2018-12-11 MED ORDER — ACETAMINOPHEN 650 MG RE SUPP: 650.0000 mg | Freq: Four times a day (QID) | RECTAL | Status: DC | PRN

## 2018-12-11 MED ORDER — ALPRAZOLAM 0.5 MG PO TABS
0.5000 mg | ORAL_TABLET | Freq: Every day | ORAL | Status: DC | PRN
  Administered 2018-12-11 – 2018-12-13 (×3): 0.5 mg via ORAL
  Filled 2018-12-11 (×3): qty 1

## 2018-12-11 MED ORDER — LORATADINE 10 MG PO TABS
10.0000 mg | ORAL_TABLET | Freq: Every day | ORAL | Status: DC
  Administered 2018-12-12 – 2018-12-13 (×2): 10 mg via ORAL
  Filled 2018-12-11 (×2): qty 1

## 2018-12-11 MED ORDER — LORAZEPAM 2 MG/ML IJ SOLN: 0.5000 mg | Freq: Once | INTRAMUSCULAR | Status: DC | PRN

## 2018-12-11 MED ORDER — VITAMIN D3 25 MCG (1000 UNIT) PO TABS
1000.0000 [IU] | ORAL_TABLET | Freq: Every day | ORAL | Status: DC
  Administered 2018-12-11 – 2018-12-13 (×3): 1000 [IU] via ORAL
  Filled 2018-12-11 (×6): qty 1

## 2018-12-11 MED ORDER — MECLIZINE HCL 25 MG PO TABS
25.0000 mg | ORAL_TABLET | Freq: Three times a day (TID) | ORAL | Status: DC | PRN
  Administered 2018-12-12: 25 mg via ORAL
  Filled 2018-12-11 (×2): qty 1

## 2018-12-11 MED ORDER — GADOBUTROL 1 MMOL/ML IV SOLN
6.0000 mL | Freq: Once | INTRAVENOUS | Status: AC | PRN
  Administered 2018-12-11: 12:00:00 6 mL via INTRAVENOUS

## 2018-12-11 MED ORDER — MECLIZINE HCL 25 MG PO TABS
25.0000 mg | ORAL_TABLET | Freq: Three times a day (TID) | ORAL | Status: DC
  Administered 2018-12-11: 14:00:00 25 mg via ORAL
  Filled 2018-12-11 (×2): qty 1

## 2018-12-11 MED ORDER — ENOXAPARIN SODIUM 40 MG/0.4ML ~~LOC~~ SOLN: 40.0000 mg | SUBCUTANEOUS | Status: DC

## 2018-12-11 MED ORDER — SODIUM CHLORIDE 0.9 % IV SOLN
INTRAVENOUS | Status: DC
  Administered 2018-12-11 – 2018-12-12 (×2): via INTRAVENOUS

## 2018-12-11 MED ORDER — DILTIAZEM HCL ER COATED BEADS 120 MG PO TB24
120.0000 mg | ORAL_TABLET | Freq: Every day | ORAL | Status: DC
  Administered 2018-12-11 – 2018-12-13 (×3): 120 mg via ORAL
  Filled 2018-12-11 (×4): qty 1

## 2018-12-11 MED ORDER — CLONIDINE HCL 0.1 MG PO TABS
0.1000 mg | ORAL_TABLET | Freq: Once | ORAL | Status: AC
  Administered 2018-12-11: 09:00:00 0.1 mg via ORAL
  Filled 2018-12-11: qty 1

## 2018-12-11 MED ORDER — HYDRALAZINE HCL 50 MG PO TABS
50.0000 mg | ORAL_TABLET | Freq: Once | ORAL | Status: AC
  Administered 2018-12-11: 09:00:00 50 mg via ORAL
  Filled 2018-12-11: qty 1

## 2018-12-11 MED ORDER — MOMETASONE FURO-FORMOTEROL FUM 200-5 MCG/ACT IN AERO
2.0000 | INHALATION_SPRAY | Freq: Two times a day (BID) | RESPIRATORY_TRACT | Status: DC
  Administered 2018-12-11 – 2018-12-13 (×4): 2 via RESPIRATORY_TRACT
  Filled 2018-12-11: qty 8.8

## 2018-12-11 MED ORDER — ONDANSETRON HCL 4 MG/2ML IJ SOLN: 4.0000 mg | Freq: Four times a day (QID) | INTRAMUSCULAR | Status: DC | PRN

## 2018-12-11 MED ORDER — AMLODIPINE BESYLATE 5 MG PO TABS
5.0000 mg | ORAL_TABLET | Freq: Every day | ORAL | Status: DC
  Administered 2018-12-11 – 2018-12-12 (×2): 5 mg via ORAL
  Filled 2018-12-11 (×2): qty 1

## 2018-12-11 MED ORDER — ALBUTEROL SULFATE (2.5 MG/3ML) 0.083% IN NEBU: 3.0000 mL | INHALATION_SOLUTION | Freq: Four times a day (QID) | RESPIRATORY_TRACT | Status: DC | PRN

## 2018-12-11 MED ORDER — ATORVASTATIN CALCIUM 20 MG PO TABS
40.0000 mg | ORAL_TABLET | Freq: Every day | ORAL | Status: DC
  Administered 2018-12-11 – 2018-12-12 (×2): 40 mg via ORAL
  Filled 2018-12-11 (×2): qty 2

## 2018-12-11 MED ORDER — BUSPIRONE HCL 10 MG PO TABS
10.0000 mg | ORAL_TABLET | Freq: Two times a day (BID) | ORAL | Status: DC
  Administered 2018-12-11 – 2018-12-13 (×5): 10 mg via ORAL
  Filled 2018-12-11 (×6): qty 1

## 2018-12-11 MED ORDER — FERROUS SULFATE 325 (65 FE) MG PO TABS
325.0000 mg | ORAL_TABLET | Freq: Every day | ORAL | Status: DC
  Administered 2018-12-12 – 2018-12-13 (×2): 325 mg via ORAL
  Filled 2018-12-11 (×2): qty 1

## 2018-12-11 MED ORDER — LEVOTHYROXINE SODIUM 50 MCG PO TABS
50.0000 ug | ORAL_TABLET | Freq: Every day | ORAL | Status: DC
  Administered 2018-12-12 – 2018-12-13 (×2): 50 ug via ORAL
  Filled 2018-12-11 (×2): qty 1

## 2018-12-11 MED ORDER — ONDANSETRON HCL 4 MG PO TABS
4.0000 mg | ORAL_TABLET | Freq: Four times a day (QID) | ORAL | Status: DC | PRN
  Administered 2018-12-11: 14:00:00 4 mg via ORAL
  Filled 2018-12-11: qty 1

## 2018-12-11 MED ORDER — ACETAMINOPHEN 325 MG PO TABS
650.0000 mg | ORAL_TABLET | Freq: Four times a day (QID) | ORAL | Status: DC | PRN
  Administered 2018-12-11 – 2018-12-12 (×2): 650 mg via ORAL
  Filled 2018-12-11 (×2): qty 2

## 2018-12-11 MED ORDER — LABETALOL HCL 5 MG/ML IV SOLN: 10.0000 mg | INTRAVENOUS | Status: DC | PRN

## 2018-12-11 MED ORDER — AMLODIPINE BESYLATE 5 MG PO TABS
5.0000 mg | ORAL_TABLET | Freq: Once | ORAL | Status: AC
  Administered 2018-12-11: 5 mg via ORAL
  Filled 2018-12-11: qty 1

## 2018-12-11 MED ORDER — CLOPIDOGREL BISULFATE 75 MG PO TABS
75.0000 mg | ORAL_TABLET | Freq: Every day | ORAL | Status: DC
  Administered 2018-12-11 – 2018-12-12 (×2): 75 mg via ORAL
  Filled 2018-12-11 (×2): qty 1

## 2018-12-11 MED ORDER — MAGNESIUM OXIDE 400 (241.3 MG) MG PO TABS
400.0000 mg | ORAL_TABLET | Freq: Two times a day (BID) | ORAL | Status: DC
  Administered 2018-12-11 – 2018-12-13 (×5): 400 mg via ORAL
  Filled 2018-12-11 (×5): qty 1

## 2018-12-11 MED ORDER — LOSARTAN POTASSIUM 50 MG PO TABS
100.0000 mg | ORAL_TABLET | Freq: Every day | ORAL | Status: DC
  Administered 2018-12-11 – 2018-12-12 (×2): 100 mg via ORAL
  Filled 2018-12-11 (×2): qty 2

## 2018-12-11 MED ORDER — CLONIDINE HCL 0.1 MG PO TABS: 0.1000 mg | ORAL_TABLET | Freq: Two times a day (BID) | ORAL | Status: DC

## 2018-12-11 MED ORDER — HYDRALAZINE HCL 50 MG PO TABS
50.0000 mg | ORAL_TABLET | Freq: Three times a day (TID) | ORAL | Status: DC
  Administered 2018-12-11 – 2018-12-12 (×3): 50 mg via ORAL
  Filled 2018-12-11 (×3): qty 1

## 2018-12-11 MED ORDER — PANTOPRAZOLE SODIUM 40 MG PO TBEC
40.0000 mg | DELAYED_RELEASE_TABLET | Freq: Every day | ORAL | Status: DC
  Administered 2018-12-11 – 2018-12-13 (×3): 40 mg via ORAL
  Filled 2018-12-11 (×3): qty 1

## 2018-12-11 NOTE — ED Notes (Signed)
Pt taken to MRI via stretcher.  

## 2018-12-11 NOTE — ED Notes (Signed)
Attempted to call floor. Gave name and number and awaiting call back.

## 2018-12-11 NOTE — ED Notes (Signed)
Pt placed on bedpan d/t stating she needs to urinate. Pt did not urinate.

## 2018-12-11 NOTE — ED Notes (Signed)
X-ray at bedside

## 2018-12-11 NOTE — Progress Notes (Signed)
Patient ID: Janice Perkins, female   DOB: [birth year], 85 y.o.   MRN: [id number]  MRI showing acute stroke.  Since the patient woke up with the symptoms of course she is out of the TPA window.  Will order rest of stroke work-up including echocardiogram and carotid ultrasound  Add Plavix to the aspirin.  With her history of atrial fibrillation would benefit from being on chronic anticoagulation.  Patient states that she has had no bleeds and bleeding with anticoagulation in the past.  We will get physical therapy, Occupational Therapy and speech therapy to work with the patient.  Allow permissive hypertension.  Change parameters of IV medications.  Will discontinue clonidine at this point.  Spoke with patient about these findings. Notified neurology of consultation Tried to reach husband on the phone but he must been on the phone with the patient.  Dr Loletha Grayer

## 2018-12-11 NOTE — Consult Note (Addendum)
Referring Physician: Leslye Peer    Chief Complaint: Confusion  HPI: Janice Perkins is an 85 y.o. female with a history of atrial fibrillation on ASA who reports that she went to bed last evening at baseline.  Awakened this morning and felt vertiginous.  Was SOB and nauseous as well.  Was confused.  Had trouble getting her words out.  EMS was called at that time and patient was brought in for evaluation.   Patient on Plavix in the past that was discontinued due to nosebleeds. Initial NIHSS of 0.  Date last known well: Date: 12/10/2018 Time last known well: Time: 21:00 tPA Given: No: Outside time window  Past Medical History:  Diagnosis Date  . Asthma   . Atrial fibrillation ([place])   . Breast cancer ([place]) 2011  . Bronchitis 04/2015  . Cancer [facility]) 2011   breast  . COPD (chronic obstructive pulmonary disease) ([place])   . Hypertension   . Shingles    10/2015    Past Surgical History:  Procedure Laterality Date  . BREAST EXCISIONAL BIOPSY Right 11/29/13   two areas FAT NECROSIS  . BREAST EXCISIONAL BIOPSY Right 10/30/2009   lumpectomy - radiation  . COLONOSCOPY WITH PROPOFOL N/A 06/10/2018   Procedure: COLONOSCOPY WITH PROPOFOL;  Surgeon: Manya Silvas, MD;  Location: [facility] ENDOSCOPY;  Service: Endoscopy;  Laterality: N/A;  . ESOPHAGOGASTRODUODENOSCOPY (EGD) WITH PROPOFOL N/A 06/10/2018   Procedure: ESOPHAGOGASTRODUODENOSCOPY (EGD) WITH PROPOFOL;  Surgeon: Manya Silvas, MD;  Location: [facility] ENDOSCOPY;  Service: Endoscopy;  Laterality: N/A;  . NASAL SINUS SURGERY      Family History  Problem Relation Age of Onset  . Hypertension Mother   . Heart disease Mother   . Cancer Mother   . Stroke Father   . Hypertension Father   . Breast cancer Sister    Social History:  reports that she has never smoked. She has never used smokeless tobacco. She reports that she does not drink alcohol or use drugs.  Allergies:  Allergies  Allergen Reactions  . Diphenhydramine Hcl Rash  .  Penicillins Hives    .Has patient had a PCN reaction causing immediate rash, facial/tongue/throat swelling, SOB or lightheadedness with hypotension: Unknown Has patient had a PCN reaction causing severe rash involving mucus membranes or skin necrosis: Unknown Has patient had a PCN reaction that required hospitalization: Unknown Has patient had a PCN reaction occurring within the last 10 years: Unknown If all of the above answers are "NO", then may proceed with Cephalosporin use.   . Captopril Other (See Comments)  . Enalapril Maleate Other (See Comments)    Other reaction(s): Unknown  . Tape Itching  . Terfenadine Other (See Comments)  . Augmentin [Amoxicillin-Pot Clavulanate] Rash    Has patient had a PCN reaction causing immediate rash, facial/tongue/throat swelling, SOB or lightheadedness with hypotension: Unknown Has patient had a PCN reaction causing severe rash involving mucus membranes or skin necrosis: Unknown Has patient had a PCN reaction that required hospitalization: Unknown Has patient had a PCN reaction occurring within the last 10 years: Unknown If all of the above answers are "NO", then may proceed with Cephalosporin use.  . Biaxin [Clarithromycin] Rash, Other (See Comments) and Hives    Other reaction(s): Unknown    Medications:  I have reviewed the patient's current medications. Prior to Admission:  Medications Prior to Admission  Medication Sig Dispense Refill Last Dose  . albuterol (ACCUNEB) 1.25 MG/3ML nebulizer solution Inhale 3 mLs into the lungs every 6 (six)  hours as needed for wheezing.    Unknown at PRN  . albuterol (PROVENTIL HFA;VENTOLIN HFA) 108 (90 Base) MCG/ACT inhaler Inhale 2 puffs into the lungs every 6 (six) hours as needed for wheezing or shortness of breath.    Unknown at PRN  . ALPRAZolam (XANAX) 0.5 MG tablet Take 0.5 mg by mouth daily as needed for anxiety or sleep.   Unknown at PRN  . atorvastatin (LIPITOR) 20 MG tablet Take 20 mg by mouth at  bedtime.    Past Week at Unknown time  . busPIRone (BUSPAR) 10 MG tablet Take 10 mg by mouth 2 (two) times daily.   Past Week at Unknown time  . cholecalciferol (VITAMIN D) 1000 units tablet Take 1,000 Units by mouth daily.   Past Week at Unknown time  . cloNIDine (CATAPRES) 0.1 MG tablet Take 0.1 mg by mouth 2 (two) times daily as needed (SBP >170).   Unknown at PRN  . diltiazem (DILACOR XR) 120 MG 24 hr capsule Take 120 mg by mouth daily.   Past Week at Unknown time  . doxycycline (VIBRAMYCIN) 100 MG capsule Take 100 mg by mouth 2 (two) times daily.   12/10/2018 at Unknown time  . esomeprazole (NEXIUM) 40 MG capsule Take 40 mg by mouth 2 (two) times daily before a meal.    Past Week at Unknown time  . ferrous sulfate 325 (65 FE) MG tablet Take 325 mg by mouth daily with breakfast.   Past Week at Unknown time  . Fluticasone-Salmeterol (ADVAIR) 250-50 MCG/DOSE AEPB Inhale 1 puff into the lungs 2 (two) times daily.   Past Week at Unknown time  . hydrALAZINE (APRESOLINE) 50 MG tablet Take 50 mg by mouth 3 (three) times daily.   Past Week at Unknown time  . HYDROcodone-acetaminophen (NORCO/VICODIN) 5-325 MG tablet Take 1 tablet by mouth 2 (two) times daily as needed for moderate pain or severe pain.    Unknown at PRN  . levothyroxine (SYNTHROID, LEVOTHROID) 50 MCG tablet Take 50 mcg by mouth daily before breakfast.   Past Week at Unknown time  . loratadine (CLARITIN) 10 MG tablet Take 10 mg by mouth daily.   Past Week at Unknown time  . losartan (COZAAR) 50 MG tablet Take 100 mg by mouth daily.   Past Week at Unknown time  . magnesium oxide (MAG-OX) 400 MG tablet Take 400 mg by mouth 2 (two) times daily.   Past Week at Unknown time  . methocarbamol (ROBAXIN) 500 MG tablet Take 500 mg by mouth every 6 (six) hours as needed for muscle spasms.   Unknown at PRN  . mometasone-formoterol (DULERA) 100-5 MCG/ACT AERO Inhale 2 puffs into the lungs 2 (two) times daily.   Past Week at Unknown time  . amLODipine  (NORVASC) 5 MG tablet Take 1 tablet (5 mg total) by mouth daily. (Patient not taking: Reported on 12/11/2018) 30 tablet 0 Not Taking at Unknown time  . meclizine (ANTIVERT) 25 MG tablet Take 1 tablet (25 mg total) by mouth 2 (two) times daily as needed for dizziness. (Patient not taking: Reported on 12/11/2018) 6 tablet 0 Completed Course at Unknown time   Scheduled: . amLODipine  5 mg Oral Daily  . busPIRone  10 mg Oral BID  . cholecalciferol  1,000 Units Oral Daily  . clopidogrel  75 mg Oral Daily  . diltiazem  120 mg Oral Daily  . [START ON 12/12/2018] enoxaparin (LOVENOX) injection  40 mg Subcutaneous Q24H  . [START ON 12/12/2018]  ferrous sulfate  325 mg Oral Q breakfast  . hydrALAZINE  50 mg Oral TID  . [START ON 12/12/2018] levothyroxine  50 mcg Oral Q0600  . [START ON 12/12/2018] loratadine  10 mg Oral Daily  . losartan  100 mg Oral QHS  . magnesium oxide  400 mg Oral BID  . mometasone-formoterol  2 puff Inhalation BID  . pantoprazole  40 mg Oral Daily    ROS: History obtained from the patient  General ROS: negative for - chills, fatigue, fever, night sweats, weight gain or weight loss Psychological ROS: confusion Ophthalmic ROS: negative for - blurry vision, double vision, eye pain or loss of vision ENT ROS: vertigo, nose stuffy Allergy and Immunology ROS: negative for - hives or itchy/watery eyes Hematological and Lymphatic ROS: negative for - bleeding problems, bruising or swollen lymph nodes Endocrine ROS: negative for - galactorrhea, hair pattern changes, polydipsia/polyuria or temperature intolerance Respiratory ROS: negative for - cough, hemoptysis, shortness of breath or wheezing Cardiovascular ROS: negative for - chest pain, dyspnea on exertion, edema or irregular heartbeat Gastrointestinal ROS: nausea Genito-Urinary ROS: negative for - dysuria, hematuria, incontinence or urinary frequency/urgency Musculoskeletal ROS: negative for - joint swelling or muscular  weakness Neurological ROS: as noted in HPI Dermatological ROS: negative for rash and skin lesion changes  Physical Examination: Blood pressure (!) 159/97, pulse 96, temperature 99.2 F (37.3 C), temperature source Oral, resp. rate 15, height 5' (1.524 m), weight 69.4 kg, SpO2 99 %.  HEENT-  Normocephalic, no lesions, without obvious abnormality.  Normal external eye and conjunctiva.  Normal TM's bilaterally.  Normal auditory canals and external ears. Normal external nose, mucus membranes and septum.  Normal pharynx. Cardiovascular- S1, S2 normal, pulses palpable throughout   Lungs- chest clear, no wheezing, rales, normal symmetric air entry Abdomen- soft, non-tender; bowel sounds normal; no masses,  no organomegaly Extremities- no edema Lymph-no adenopathy palpable Musculoskeletal-no joint tenderness, deformity or swelling Skin-warm and dry, no hyperpigmentation, vitiligo, or suspicious lesions  Neurological Examination   Mental Status: Alert and awake.  Oriented.  Tangential, rambling.  Speech fluent without evidence of aphasia.  Able to follow 3 step commands without difficulty. Cranial Nerves: II: Discs flat bilaterally; Visual fields grossly normal, pupils equal, round, reactive to light and accommodation III,IV, VI: ptosis not present, extra-ocular motions intact bilaterally V,VII: smile symmetric, facial light touch sensation normal bilaterally VIII: hearing normal bilaterally IX,X: gag reflex present XI: bilateral shoulder shrug XII: midline tongue extension Motor: Right : Upper extremity   5/5    Left:     Upper extremity   5/5  Lower extremity   5/5     Lower extremity   5/5 Tone and bulk:normal tone throughout; no atrophy noted Sensory: Pinprick and light touch intact throughout, bilaterally Deep Tendon Reflexes: Symmetric throughout Plantars: Right: downgoing   Left: downgoing Cerebellar: Normal finger-to-nose and normal heel-to-shin testing bilaterally Gait: not  tested due to safety concerns    Laboratory Studies:  Basic Metabolic Panel: Recent Labs  Lab 12/11/18 0752  NA 136  K 3.9  CL 100  CO2 28  GLUCOSE 110*  BUN 20  CREATININE 0.92  CALCIUM 9.4    Liver Function Tests: Recent Labs  Lab 12/11/18 0752  AST 27  ALT 22  ALKPHOS 57  BILITOT 0.8  PROT 7.3  ALBUMIN 3.8   No results for input(s): LIPASE, AMYLASE in the last 168 hours. No results for input(s): AMMONIA in the last 168 hours.  CBC: Recent Labs  Lab 12/11/18 0752  WBC 6.1  NEUTROABS 3.3  HGB 15.0  HCT 43.7  MCV 92.2  PLT 192    Cardiac Enzymes: Recent Labs  Lab 12/11/18 0752  TROPONINI <0.03    BNP: Invalid input(s): POCBNP  CBG: No results for input(s): GLUCAP in the last 168 hours.  Microbiology: Results for orders placed or performed during the hospital encounter of 12/11/18  SARS Coronavirus 2 [facility] order, Performed in [place] hospital lab)     Status: None   Collection Time: 12/11/18  7:52 AM  Result Value Ref Range Status   SARS Coronavirus 2 NEGATIVE NEGATIVE Final    Comment: (NOTE) If result is NEGATIVE SARS-CoV-2 target nucleic acids are NOT DETECTED. The SARS-CoV-2 RNA is generally detectable in upper and lower  respiratory specimens during the acute phase of infection. The lowest  concentration of SARS-CoV-2 viral copies this assay can detect is 250  copies / mL. A negative result does not preclude SARS-CoV-2 infection  and should not be used as the sole basis for treatment or other  patient management decisions.  A negative result may occur with  improper specimen collection / handling, submission of specimen other  than nasopharyngeal swab, presence of viral mutation(s) within the  areas targeted by this assay, and inadequate number of viral copies  (<250 copies / mL). A negative result must be combined with clinical  observations, patient history, and epidemiological information. If result is POSITIVE SARS-CoV-2  target nucleic acids are DETECTED. The SARS-CoV-2 RNA is generally detectable in upper and lower  respiratory specimens dur ing the acute phase of infection.  Positive  results are indicative of active infection with SARS-CoV-2.  Clinical  correlation with patient history and other diagnostic information is  necessary to determine patient infection status.  Positive results do  not rule out bacterial infection or co-infection with other viruses. If result is PRESUMPTIVE POSTIVE SARS-CoV-2 nucleic acids MAY BE PRESENT.   A presumptive positive result was obtained on the submitted specimen  and confirmed on repeat testing.  While 2019 novel coronavirus  (SARS-CoV-2) nucleic acids may be present in the submitted sample  additional confirmatory testing may be necessary for epidemiological  and / or clinical management purposes  to differentiate between  SARS-CoV-2 and other Sarbecovirus currently known to infect humans.  If clinically indicated additional testing with an alternate test  methodology [phone]) is advised. The SARS-CoV-2 RNA is generally  detectable in upper and lower respiratory sp ecimens during the acute  phase of infection. The expected result is Negative. Fact Sheet for Patients:  StrictlyIdeas.no Fact Sheet for Healthcare Providers: BankingDealers.co.za This test is not yet approved or cleared by the [place] FDA and has been authorized for detection and/or diagnosis of SARS-CoV-2 by FDA under an Emergency Use Authorization (EUA).  This EUA will remain in effect (meaning this test can be used) for the duration of the COVID-19 declaration under Section 564(b)(1) of the Act, 21 U.S.C. section 360bbb-3(b)(1), unless the authorization is terminated or revoked sooner. Performed at [facility], [address] [place], [place] [address]     Coagulation Studies: No results for input(s): LABPROT, INR in the  last 72 hours.  Urinalysis:  Recent Labs  Lab 12/11/18 1407  COLORURINE STRAW*  LABSPEC 1.008  PHURINE 8.0  GLUCOSEU NEGATIVE  HGBUR NEGATIVE  BILIRUBINUR NEGATIVE  KETONESUR NEGATIVE  PROTEINUR NEGATIVE  NITRITE NEGATIVE  LEUKOCYTESUR NEGATIVE    Lipid Panel:    Component Value Date/Time  CHOL 120 12/18/2016 0005   TRIG 81 12/18/2016 0005   HDL 30 (L) 12/18/2016 0005   CHOLHDL 4.0 12/18/2016 0005   VLDL 16 12/18/2016 0005   LDLCALC 74 12/18/2016 0005    HgbA1C:  Lab Results  Component Value Date   HGBA1C 5.9 (H) 12/18/2016    Urine Drug Screen:      Component Value Date/Time   LABOPIA POSITIVE (A) 09/02/2018 0918   COCAINSCRNUR NONE DETECTED 09/02/2018 0918   LABBENZ POSITIVE (A) 09/02/2018 0918   AMPHETMU NONE DETECTED 09/02/2018 0918   THCU NONE DETECTED 09/02/2018 0918   LABBARB NONE DETECTED 09/02/2018 0918    Alcohol Level: No results for input(s): ETH in the last 168 hours.  Other results: EKG: atrial fibrillation, rate 85 bpm.  Imaging: Ct Head Wo Contrast  Result Date: 12/11/2018 CLINICAL DATA:  Speech difficulty developed earlier today. Elevated blood pressure. History of atrial fibrillation. Breast cancer. EXAM: CT HEAD WITHOUT CONTRAST TECHNIQUE: Contiguous axial images were obtained from the base of the skull through the vertex without intravenous contrast. COMPARISON:  CT head 09/02/2018. MR head 09/16/2018. FINDINGS: Brain: No evidence of acute infarction, hemorrhage, hydrocephalus, extra-axial collection or mass lesion/mass effect. Mild to moderate cerebral and cerebellar atrophy, unchanged from priors, not unexpected for age. Mild hypoattenuation of white matter, likely small vessel disease. Vascular: Calcification of the cavernous internal carotid arteries consistent with cerebrovascular atherosclerotic disease. No signs of intracranial large vessel occlusion. Skull: Intact Sinuses/Orbits: No acute findings. Other: No middle ear or mastoid  fluid. IMPRESSION: Atrophy and small vessel disease similar to priors. No acute intracranial findings. Electronically Signed   By: Staci Righter M.D.   On: 12/11/2018 08:45   Dg Chest Port 1 View  Result Date: 12/11/2018 CLINICAL DATA:  Fatigue and confusion. Recently treated for bronchitis. EXAM: PORTABLE CHEST 1 VIEW COMPARISON:  September 02, 2018 FINDINGS: Stable mild cardiomegaly. The hila and mediastinum are normal. No pneumothorax. No nodules or masses. No focal infiltrates or overt edema. IMPRESSION: No active disease. Electronically Signed   By: Dorise Bullion III M.D   On: 12/11/2018 09:09   Mr Brain/iac W Wo Contrast  Result Date: 12/11/2018 CLINICAL DATA:  Dizziness with vertigo. EXAM: MRI HEAD WITHOUT AND WITH CONTRAST TECHNIQUE: Multiplanar, multiecho pulse sequences of the brain and surrounding structures were obtained without and with intravenous contrast. CONTRAST:  Gadavist 6 mL. COMPARISON:  CT head earlier today.  MR head, 12/18/2016. FINDINGS: Brain: 5 mm focus of restricted diffusion, LEFT central sulcus near the vertex, corresponding low ADC, consistent with acute infarct. No other similar areas of restricted diffusion. No hemorrhage, mass lesion, hydrocephalus, or extra-axial fluid. Generalized atrophy. Mild subcortical and periventricular T2 and FLAIR hyperintensities, likely chronic microvascular ischemic change. Thin-section imaging through the posterior fossa reveals no evidence of vestibular schwannoma or posterior fossa mass. Post infusion, no abnormal enhancement of the brain or meninges. Vascular: Normal flow voids. Skull and upper cervical spine: Normal marrow signal. Sinuses/Orbits: Negative. Other: None. 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 No hemorrhage. Atrophy and small vessel disease. No abnormal postcontrast enhancement. Electronically Signed   By: Staci Righter M.D.   On: 12/11/2018 12:22    Assessment: 85 y.o. female with a  history of afib on ASA who presents with altered mental status. MRI of the brain reviewed and shows a small, acute left parietal infarct.  Etiology likely embolic.  Patient on ASA prior to admission.  Initially BP was elevated.  Currently BP controlled.  Carotid dopplers and echocardiogram pending.    Stroke Risk Factors - atrial fibrillation and hypertension  Plan: 1. HgbA1c, fasting lipid panel 2. PT consult, OT consult, Speech consult 3. Echocardiogram pending 4. Carotid dopplers pending 5. Prophylactic therapy-Eliquis.  May start tomorrow. 6. NPO until RN stroke swallow screen 7. Telemetry monitoring 8. Frequent neuro checks   Alexis Goodell, MD Neurology [phone] 12/11/2018, 3:24 PM

## 2018-12-11 NOTE — ED Provider Notes (Signed)
[facility] Emergency Department Provider Note   ____________________________________________   First MD Initiated Contact with Patient 12/11/18 [phone]     (approximate)  I have reviewed the triage vital signs and the nursing notes.   HISTORY  Chief Complaint Altered Mental Status and Cough  EM caveat: Patient reports he seems or feels confused, cannot remember majority of today's events  HPI Janice Perkins is a 86 y.o. female EMS reports they were called out for patient due to difficulty with her speaking of her speech seeming off or confused this morning.  She was last seen well about 9 PM last night.  EMS also reports she is recently being treated for bronchitis on doxycycline  The patient reports she just does not feel right.  She has a hard time describing it before she just feels off, a little bit confused or a bit out of it.  She is fully alert if she describes this with tells me she is does not feel like something is right over that she just feels like she cannot process things well.  She denies any weakness in one arm or leg.  Denies headache.  No chest pain or shortness of breath.  Denies being in pain or having any fever.  She has noticed she has a cough, but also she does not recall any history of being on antibiotic or being treated for bronchitis   Past Medical History:  Diagnosis Date  . Asthma   . Atrial fibrillation ([place])   . Breast cancer ([place]) 2011  . Bronchitis 04/2015  . Cancer [facility]) 2011   breast  . COPD (chronic obstructive pulmonary disease) ([place])   . Hypertension   . Shingles    10/2015    Patient Active Problem List   Diagnosis Date Noted  . Hypoxia 05/30/2018  . Leukocytosis 04/05/2018  . Pneumonia 11/15/2017  . Pleural effusion 05/16/2017  . AF (paroxysmal atrial fibrillation) ([place]) 05/16/2017  . Breast cancer ([place]) 05/16/2017  . Dependence on nocturnal oxygen therapy 05/16/2017  . HTN (hypertension) 05/16/2017  .  Generalized weakness 03/07/2017  . Hyponatremia 03/07/2017  . Dehydration 03/07/2017  . UTI (urinary tract infection) 03/07/2017  . HCAP (healthcare-associated pneumonia) 01/19/2017  . AKI (acute kidney injury) ([place]) 12/18/2016  . Primary cancer of upper outer quadrant of right female breast ([place]) 04/07/2016  . Lumbar radiculopathy 03/23/2016  . Spinal stenosis, lumbar region, with neurogenic claudication 03/23/2016  . DDD (degenerative disc disease), lumbar 01/14/2015  . Lumbosacral facet joint syndrome 01/14/2015  . Sacroiliac joint dysfunction 01/14/2015  . Greater trochanteric bursitis 01/14/2015  . Bilateral occipital neuralgia 01/14/2015    Past Surgical History:  Procedure Laterality Date  . BREAST EXCISIONAL BIOPSY Right 11/29/13   two areas FAT NECROSIS  . BREAST EXCISIONAL BIOPSY Right 10/30/2009   lumpectomy - radiation  . COLONOSCOPY WITH PROPOFOL N/A 06/10/2018   Procedure: COLONOSCOPY WITH PROPOFOL;  Surgeon: Manya Silvas, MD;  Location: [facility] ENDOSCOPY;  Service: Endoscopy;  Laterality: N/A;  . ESOPHAGOGASTRODUODENOSCOPY (EGD) WITH PROPOFOL N/A 06/10/2018   Procedure: ESOPHAGOGASTRODUODENOSCOPY (EGD) WITH PROPOFOL;  Surgeon: Manya Silvas, MD;  Location: [facility] ENDOSCOPY;  Service: Endoscopy;  Laterality: N/A;  . NASAL SINUS SURGERY      Prior to Admission medications   Medication Sig Start Date End Date Taking? Authorizing Provider  albuterol (ACCUNEB) 1.25 MG/3ML nebulizer solution Inhale 3 mLs into the lungs every 6 (six) hours as needed for wheezing.    Yes [provider]  albuterol (PROVENTIL HFA;VENTOLIN HFA) 108 (90 Base) MCG/ACT inhaler Inhale 2 puffs into the lungs every 6 (six) hours as needed for wheezing or shortness of breath.    Yes [provider]  ALPRAZolam Duanne Moron) 0.5 MG tablet Take 0.5 mg by mouth daily as needed for anxiety or sleep.   Yes [provider]  atorvastatin (LIPITOR) 20 MG tablet Take 20 mg by mouth at  bedtime.    Yes [provider]  busPIRone (BUSPAR) 10 MG tablet Take 10 mg by mouth 2 (two) times daily.   Yes [provider]  cholecalciferol (VITAMIN D) 1000 units tablet Take 1,000 Units by mouth daily.   Yes [provider]  cloNIDine (CATAPRES) 0.1 MG tablet Take 0.1 mg by mouth 2 (two) times daily as needed (SBP >170).   Yes [provider]  diltiazem (DILACOR XR) 120 MG 24 hr capsule Take 120 mg by mouth daily.   Yes [provider]  doxycycline (VIBRAMYCIN) 100 MG capsule Take 100 mg by mouth 2 (two) times daily. 12/10/18 12/17/18 Yes [provider]  esomeprazole (NEXIUM) 40 MG capsule Take 40 mg by mouth 2 (two) times daily before a meal.    Yes [provider]  ferrous sulfate 325 (65 FE) MG tablet Take 325 mg by mouth daily with breakfast.   Yes [provider]  Fluticasone-Salmeterol (ADVAIR) 250-50 MCG/DOSE AEPB Inhale 1 puff into the lungs 2 (two) times daily.   Yes [provider]  hydrALAZINE (APRESOLINE) 50 MG tablet Take 50 mg by mouth 3 (three) times daily.   Yes [provider]  HYDROcodone-acetaminophen (NORCO/VICODIN) 5-325 MG tablet Take 1 tablet by mouth 2 (two) times daily as needed for moderate pain or severe pain.    Yes [provider]  levothyroxine (SYNTHROID, LEVOTHROID) 50 MCG tablet Take 50 mcg by mouth daily before breakfast.   Yes [provider]  loratadine (CLARITIN) 10 MG tablet Take 10 mg by mouth daily.   Yes [provider]  losartan (COZAAR) 50 MG tablet Take 100 mg by mouth daily.   Yes [provider]  magnesium oxide (MAG-OX) 400 MG tablet Take 400 mg by mouth 2 (two) times daily.   Yes [provider]  methocarbamol (ROBAXIN) 500 MG tablet Take 500 mg by mouth every 6 (six) hours as needed for muscle spasms.   Yes [provider]  mometasone-formoterol (DULERA) 100-5 MCG/ACT AERO Inhale 2 puffs into the lungs 2  (two) times daily.   Yes [provider]  amLODipine (NORVASC) 5 MG tablet Take 1 tablet (5 mg total) by mouth daily. Patient not taking: Reported on 12/11/2018 09/09/18   MayoPete Pelt, MD  meclizine (ANTIVERT) 25 MG tablet Take 1 tablet (25 mg total) by mouth 2 (two) times daily as needed for dizziness. Patient not taking: Reported on 12/11/2018 09/16/18   Merlyn Lot, MD    Allergies Diphenhydramine hcl; Penicillins; Captopril; Enalapril maleate; Tape; Terfenadine; Augmentin [amoxicillin-pot clavulanate]; and Biaxin [clarithromycin]  Family History  Problem Relation Age of Onset  . Hypertension Mother   . Heart disease Mother   . Cancer Mother   . Stroke Father   . Hypertension Father   . Breast cancer Sister     Social History Social History   Tobacco Use  . Smoking status: Never Smoker  . Smokeless tobacco: Never Used  Substance Use Topics  . Alcohol use: No    Alcohol/week: 0.0 standard drinks  . Drug use: No  Review of Systems  EM caveat, also I do not think the review of system is highly reliable  Constitutional: No fever/chills Eyes: No visual changes. ENT: No sore throat. Cardiovascular: Denies chest pain. Respiratory: Denies shortness of breath. Gastrointestinal: No abdominal pain.   Genitourinary: Negative for dysuria. Musculoskeletal: Negative for back pain. Skin: Negative for rash. Neurological: Negative for headaches, areas of focal weakness or numbness.    ____________________________________________   PHYSICAL EXAM:  VITAL SIGNS: ED Triage Vitals [12/11/18 0752]  Enc Vitals Group     BP (!) 190/107     Pulse Rate 78     Resp 18     Temp 99.1 F (37.3 C)     Temp Source Oral     SpO2 98 %     Weight      Height      Head Circumference      Peak Flow      Pain Score 0     Pain Loc      Pain Edu?      Excl. in [place]?     Constitutional: Alert and oriented to self but confused to situation not remembering what is going  on that led to her being in the hospital mother just does not feel well.  She is pleasant and in no distress.  Best described as pleasantly confused.  No picking, no agitation, not obviously delirious Eyes: Conjunctivae are normal. Head: Atraumatic. Nose: No congestion/rhinnorhea. Mouth/Throat: Mucous membranes are moist. Neck: No stridor.  Cardiovascular: Normal rate, regular rhythm. Grossly normal heart sounds.  Good peripheral circulation. Respiratory: Normal respiratory effort.  No retractions. Lungs CTAB. Gastrointestinal: Soft and nontender. No distention. Musculoskeletal: No lower extremity tenderness nor edema. Neurologic:  Normal speech and language. No gross focal neurologic deficits are appreciated.  She just seems confused overall.  Moves all extremities well.  No focal deficits.  Cranial nerve exam is normal.  Fully alert with clear speech pattern. Skin:  Skin is warm, dry and intact. No rash noted. Psychiatric: Mood and affect are normal. Speech and behavior are normal.  ____________________________________________   LABS (all labs ordered are listed, but only abnormal results are displayed)  Labs Reviewed  CBC WITH DIFFERENTIAL/PLATELET - Abnormal; Notable for the following components:      Result Value   Abs Immature Granulocytes 0.27 (*)    All other components within normal limits  COMPREHENSIVE METABOLIC PANEL - Abnormal; Notable for the following components:   Glucose, Bld 110 (*)    GFR calc non Af Amer 57 (*)    All other components within normal limits  SARS CORONAVIRUS 2 (HOSPITAL ORDER, [place] [facility])  PROCALCITONIN  TROPONIN I  URINALYSIS, COMPLETE (UACMP) WITH MICROSCOPIC   ____________________________________________  EKG  Reviewed and interpreted by me at 8 AM Heart rate 85 QRS 90 QTc 460 A. fib, rate well controlled.  Fairly regular.  No evidence of acute ischemia ____________________________________________  RADIOLOGY   Ct Head Wo Contrast  Result Date: 12/11/2018 CLINICAL DATA:  Speech difficulty developed earlier today. Elevated blood pressure. History of atrial fibrillation. Breast cancer. EXAM: CT HEAD WITHOUT CONTRAST TECHNIQUE: Contiguous axial images were obtained from the base of the skull through the vertex without intravenous contrast. COMPARISON:  CT head 09/02/2018. MR head 09/16/2018. FINDINGS: Brain: No evidence of acute infarction, hemorrhage, hydrocephalus, extra-axial collection or mass lesion/mass effect. Mild to moderate cerebral and cerebellar atrophy, unchanged from priors, not unexpected for age. Mild hypoattenuation of white matter,  likely small vessel disease. Vascular: Calcification of the cavernous internal carotid arteries consistent with cerebrovascular atherosclerotic disease. No signs of intracranial large vessel occlusion. Skull: Intact Sinuses/Orbits: No acute findings. Other: No middle ear or mastoid fluid. IMPRESSION: Atrophy and small vessel disease similar to priors. No acute intracranial findings. Electronically Signed   By: Staci Righter M.D.   On: 12/11/2018 08:45   Dg Chest Port 1 View  Result Date: 12/11/2018 CLINICAL DATA:  Fatigue and confusion. Recently treated for bronchitis. EXAM: PORTABLE CHEST 1 VIEW COMPARISON:  September 02, 2018 FINDINGS: Stable mild cardiomegaly. The hila and mediastinum are normal. No pneumothorax. No nodules or masses. No focal infiltrates or overt edema. IMPRESSION: No active disease. Electronically Signed   By: Dorise Bullion III M.D   On: 12/11/2018 09:09      ____________________________________________   PROCEDURES  Procedure(s) performed: None  Procedures  Critical Care performed: No  ____________________________________________   INITIAL IMPRESSION / ASSESSMENT AND PLAN / ED COURSE  Pertinent labs & imaging results that were available during my care of the patient were reviewed by me and considered in my medical decision  making (see chart for details).   Patient presents for confusion.  Husband reports similar in the past due to low sodium, also a mini stroke because something similar in the past.  There is no clear evidence of stroke, she has no focal deficits, she does seem confused but causes somewhat unclear.  Broad work-up including CT, chest x-ray, infectious work-up, no associated abdominal pain, denies chest or respiratory symptoms except had recent cough and bronchitis thus will send a coronavirus test to exclude this as well.  She denies acute shortness of breath.  I am suspicious this the exact cause, also notably severely hypertensive, discussed with her husband and reviewed her medications and we will give her oral medication if she passes her swallowing test as she has not had her medications.  Does not seem to have the presentation of acute hypertensive encephalopathy or posterior reversible encephalopathy, though this would remain on the differential.  Clinical Course as of Dec 10 1004  Sun Dec 11, 2018  0910 Husband reports she is very confused. Similar to when she had a very low sodium and also once had a min-stroke that was sort of similar. This AM she got up and was speaking but just confused, saying she didn't feel well but seemed disoriented.    [MQ]    Clinical Course User Index [MQ] Delman Kitten, MD   ----------------------------------------- 10:06 AM on 12/11/2018 -----------------------------------------  Due to the patient's confusion which is ongoing, I discussed the case with the hospitalist will admit for further work-up.  Differential remains broad, but at this point does not appear to be due to her sodium.  Consideration of possible urinary tract infection is given, patient had a external catheter but sample was unable to be collected.  Discussed with Dr. Anselm Jungling, patient is pending urinalysis.  ____________________________________________   FINAL CLINICAL IMPRESSION(S) / ED  DIAGNOSES  Final diagnoses:  Altered mental status, unspecified altered mental status type  Bronchitis        Note:  This document was prepared using Dragon voice recognition software and may include unintentional dictation errors       Delman Kitten, MD 12/11/18 1007

## 2018-12-11 NOTE — ED Notes (Signed)
Pt taken to CT via stretcher.

## 2018-12-11 NOTE — Progress Notes (Signed)
Patient ID: Janice Perkins, female   DOB: Dec 29, 1932, 85 y.o.   MRN: [id number]  ACP note  Spoke with patient at the bedside and husband on the phone with our no visitor policy.  Diagnosis: Hypertensive encephalopathy, vertigo with nystagmus, atrial fibrillation, COPD, history of breast cancer, anxiety, hyperlipidemia, hypothyroidism  CODE STATUS discussed and patient wishes to be a full code.  Patient came in with not being able to think straight.  She has vertigo and some nausea and not breathing very well.  In the ER her blood pressure was very elevated.  She was recently started on doxycycline.  Hospitalist services were contacted for further evaluation.  We will try to get her blood pressure better.  MRI of the brain with thin cuts through the ears to look for thing that can be causing her vertigo.  With her history of atrial fibrillation the MRI will also rule out stroke.  Time spent on ACP discussion 17 minutes Dr Loletha Grayer

## 2018-12-11 NOTE — ED Notes (Signed)
ED TO INPATIENT HANDOFF REPORT  ED Nurse Name and Phone #:  Anda Kraft [phone]  J Name/Age/Gender Janice Perkins 85 y.o. female Room/Bed: ED11A/ED11A  Code Status   Code Status: Full Code  Home/SNF/Other Home Patient oriented to: self, place and situation Is this baseline? No   Triage Complete: Triage complete  Chief Complaint AMS  Triage Note Pt arrives from home via [facility]. Just started on doxycycline for bronchicits (has cough per EMS). Lives with family at home who stated this morning pt was having trouble getting words out. No temp per EMS. 825/053 BP with EMS. HR 88, a fib EMS (a fib normal for pt). Hx COPD. CBG 106. EMS states pt began c/o of HA as well. LKW 9p last night. Family told EMS that pt woke up altered with speech changes.    Allergies Allergies  Allergen Reactions  . Diphenhydramine Hcl Rash  . Penicillins Hives    .Has patient had a PCN reaction causing immediate rash, facial/tongue/throat swelling, SOB or lightheadedness with hypotension: Unknown Has patient had a PCN reaction causing severe rash involving mucus membranes or skin necrosis: Unknown Has patient had a PCN reaction that required hospitalization: Unknown Has patient had a PCN reaction occurring within the last 10 years: Unknown If all of the above answers are "NO", then may proceed with Cephalosporin use.   . Captopril Other (See Comments)  . Enalapril Maleate Other (See Comments)    Other reaction(s): Unknown  . Tape Itching  . Terfenadine Other (See Comments)  . Augmentin [Amoxicillin-Pot Clavulanate] Rash    Has patient had a PCN reaction causing immediate rash, facial/tongue/throat swelling, SOB or lightheadedness with hypotension: Unknown Has patient had a PCN reaction causing severe rash involving mucus membranes or skin necrosis: Unknown Has patient had a PCN reaction that required hospitalization: Unknown Has patient had a PCN reaction occurring within the last 10 years: Unknown If all of  the above answers are "NO", then may proceed with Cephalosporin use.  . Biaxin [Clarithromycin] Rash, Other (See Comments) and Hives    Other reaction(s): Unknown    Level of Care/Admitting Diagnosis ED Disposition    ED Disposition Condition [facility] Area: [place] [[id number]]  Level of Care: Med-Surg [16]  Covid Evaluation: N/A  Diagnosis: Hypertensive encephalopathy [437.2.ICD-9-CM]  Admitting Physician: Loletha Grayer [[id number]]  Attending Physician: Loletha Grayer [[id number]]  Estimated length of stay: past midnight tomorrow  Certification:: I certify this patient will need inpatient services for at least 2 midnights  PT Class (Do Not Modify): Inpatient [101]  PT Acc Code (Do Not Modify): Private [1]       B Medical/Surgery History Past Medical History:  Diagnosis Date  . Asthma   . Atrial fibrillation ([place])   . Breast cancer ([place]) 2011  . Bronchitis 04/2015  . Cancer [facility]) 2011   breast  . COPD (chronic obstructive pulmonary disease) ([place])   . Hypertension   . Shingles    10/2015   Past Surgical History:  Procedure Laterality Date  . BREAST EXCISIONAL BIOPSY Right 11/29/13   two areas FAT NECROSIS  . BREAST EXCISIONAL BIOPSY Right 10/30/2009   lumpectomy - radiation  . COLONOSCOPY WITH PROPOFOL N/A 06/10/2018   Procedure: COLONOSCOPY WITH PROPOFOL;  Surgeon: Manya Silvas, MD;  Location: [facility] ENDOSCOPY;  Service: Endoscopy;  Laterality: N/A;  . ESOPHAGOGASTRODUODENOSCOPY (EGD) WITH PROPOFOL N/A 06/10/2018   Procedure: ESOPHAGOGASTRODUODENOSCOPY (EGD) WITH PROPOFOL;  Surgeon: Manya Silvas, MD;  Location: [facility]  ENDOSCOPY;  Service: Endoscopy;  Laterality: N/A;  . NASAL SINUS SURGERY       A IV Location/Drains/Wounds Patient Lines/Drains/Airways Status   Active Line/Drains/Airways    Name:   Placement date:   Placement time:   Site:   Days:   Peripheral IV 12/11/18 Right Antecubital   12/11/18    0802    Antecubital    less than 1   External Urinary Catheter   12/11/18    0857    -   less than 1          Intake/Output Last 24 hours No intake or output data in the 24 hours ending 12/11/18 1044  Labs/Imaging Results for orders placed or performed during the hospital encounter of 12/11/18 (from the past 48 hour(s))  SARS Coronavirus 2 [facility] order, Performed in [place] hospital lab)     Status: None   Collection Time: 12/11/18  7:52 AM  Result Value Ref Range   SARS Coronavirus 2 NEGATIVE NEGATIVE    Comment: (NOTE) If result is NEGATIVE SARS-CoV-2 target nucleic acids are NOT DETECTED. The SARS-CoV-2 RNA is generally detectable in upper and lower  respiratory specimens during the acute phase of infection. The lowest  concentration of SARS-CoV-2 viral copies this assay can detect is 250  copies / mL. A negative result does not preclude SARS-CoV-2 infection  and should not be used as the sole basis for treatment or other  patient management decisions.  A negative result may occur with  improper specimen collection / handling, submission of specimen other  than nasopharyngeal swab, presence of viral mutation(s) within the  areas targeted by this assay, and inadequate number of viral copies  (<250 copies / mL). A negative result must be combined with clinical  observations, patient history, and epidemiological information. If result is POSITIVE SARS-CoV-2 target nucleic acids are DETECTED. The SARS-CoV-2 RNA is generally detectable in upper and lower  respiratory specimens dur ing the acute phase of infection.  Positive  results are indicative of active infection with SARS-CoV-2.  Clinical  correlation with patient history and other diagnostic information is  necessary to determine patient infection status.  Positive results do  not rule out bacterial infection or co-infection with other viruses. If result is PRESUMPTIVE POSTIVE SARS-CoV-2 nucleic acids MAY BE PRESENT.   A presumptive  positive result was obtained on the submitted specimen  and confirmed on repeat testing.  While 2019 novel coronavirus  (SARS-CoV-2) nucleic acids may be present in the submitted sample  additional confirmatory testing may be necessary for epidemiological  and / or clinical management purposes  to differentiate between  SARS-CoV-2 and other Sarbecovirus currently known to infect humans.  If clinically indicated additional testing with an alternate test  methodology [phone]) is advised. The SARS-CoV-2 RNA is generally  detectable in upper and lower respiratory sp ecimens during the acute  phase of infection. The expected result is Negative. Fact Sheet for Patients:  StrictlyIdeas.no Fact Sheet for Healthcare Providers: BankingDealers.co.za This test is not yet approved or cleared by the [place] FDA and has been authorized for detection and/or diagnosis of SARS-CoV-2 by FDA under an Emergency Use Authorization (EUA).  This EUA will remain in effect (meaning this test can be used) for the duration of the COVID-19 declaration under Section 564(b)(1) of the Act, 21 U.S.C. section 360bbb-3(b)(1), unless the authorization is terminated or revoked sooner. Performed at [facility], [address], [place], [place] [address]   CBC WITH  DIFFERENTIAL     Status: Abnormal   Collection Time: 12/11/18  7:52 AM  Result Value Ref Range   WBC 6.1 4.0 - 10.5 K/uL   RBC 4.74 3.87 - 5.11 MIL/uL   Hemoglobin 15.0 12.0 - 15.0 g/dL   HCT 43.7 36.0 - 46.0 %   MCV 92.2 80.0 - 100.0 fL   MCH 31.6 26.0 - 34.0 pg   MCHC 34.3 30.0 - 36.0 g/dL   RDW 13.5 11.5 - 15.5 %   Platelets 192 150 - 400 K/uL   nRBC 0.0 0.0 - 0.2 %   Neutrophils Relative % 54 %   Neutro Abs 3.3 1.7 - 7.7 K/uL   Lymphocytes Relative 27 %   Lymphs Abs 1.7 0.7 - 4.0 K/uL   Monocytes Relative 11 %   Monocytes Absolute 0.7 0.1 - 1.0 K/uL   Eosinophils Relative 3 %    Eosinophils Absolute 0.2 0.0 - 0.5 K/uL   Basophils Relative 1 %   Basophils Absolute 0.0 0.0 - 0.1 K/uL   Immature Granulocytes 4 %   Abs Immature Granulocytes 0.27 (H) 0.00 - 0.07 K/uL    Comment: Performed at [facility], [address] [place], [place] [address]  Comprehensive metabolic panel     Status: Abnormal   Collection Time: 12/11/18  7:52 AM  Result Value Ref Range   Sodium 136 135 - 145 mmol/L   Potassium 3.9 3.5 - 5.1 mmol/L   Chloride 100 98 - 111 mmol/L   CO2 28 22 - 32 mmol/L   Glucose, Bld 110 (H) 70 - 99 mg/dL   BUN 20 8 - 23 mg/dL   Creatinine, Ser 0.92 0.44 - 1.00 mg/dL   Calcium 9.4 8.9 - 10.3 mg/dL   Total Protein 7.3 6.5 - 8.1 g/dL   Albumin 3.8 3.5 - 5.0 g/dL   AST 27 15 - 41 U/L   ALT 22 0 - 44 U/L   Alkaline Phosphatase 57 38 - 126 U/L   Total Bilirubin 0.8 0.3 - 1.2 mg/dL   GFR calc non Af Amer 57 (L) >60 mL/min   GFR calc Af Amer >60 >60 mL/min   Anion gap 8 5 - 15    Comment: Performed at [facility], [address] [place], [place] [address]  Procalcitonin     Status: None   Collection Time: 12/11/18  7:52 AM  Result Value Ref Range   Procalcitonin <0.10 ng/mL    Comment:        Interpretation: PCT (Procalcitonin) <= 0.5 ng/mL: Systemic infection (sepsis) is not likely. Local bacterial infection is possible. (NOTE)       Sepsis PCT Algorithm           Lower Respiratory Tract                                      Infection PCT Algorithm    ----------------------------     ----------------------------         PCT < 0.25 ng/mL                PCT < 0.10 ng/mL         Strongly encourage             Strongly discourage   discontinuation of antibiotics    initiation of antibiotics    ----------------------------     -----------------------------  PCT 0.25 - 0.50 ng/mL            PCT 0.10 - 0.25 ng/mL               OR       >80% decrease in PCT            Discourage initiation of                                             antibiotics      Encourage discontinuation           of antibiotics    ----------------------------     -----------------------------         PCT >= 0.50 ng/mL              PCT 0.26 - 0.50 ng/mL               AND        <80% decrease in PCT             Encourage initiation of                                             antibiotics       Encourage continuation           of antibiotics    ----------------------------     -----------------------------        PCT >= 0.50 ng/mL                  PCT > 0.50 ng/mL               AND         increase in PCT                  Strongly encourage                                      initiation of antibiotics    Strongly encourage escalation           of antibiotics                                     -----------------------------                                           PCT <= 0.25 ng/mL                                                 OR                                        > 80% decrease in PCT  Discontinue / Do not initiate                                             antibiotics Performed at [facility], [address] [place], [place] [address]   Troponin I - ONCE - STAT     Status: None   Collection Time: 12/11/18  7:52 AM  Result Value Ref Range   Troponin I <0.03 <0.03 ng/mL    Comment: Performed at [facility], [place], [place] [address]   Ct Head Wo Contrast  Result Date: 12/11/2018 CLINICAL DATA:  Speech difficulty developed earlier today. Elevated blood pressure. History of atrial fibrillation. Breast cancer. EXAM: CT HEAD WITHOUT CONTRAST TECHNIQUE: Contiguous axial images were obtained from the base of the skull through the vertex without intravenous contrast. COMPARISON:  CT head 09/02/2018. MR head 09/16/2018. FINDINGS: Brain: No evidence of acute infarction, hemorrhage, hydrocephalus, extra-axial collection or mass lesion/mass effect. Mild to moderate  cerebral and cerebellar atrophy, unchanged from priors, not unexpected for age. Mild hypoattenuation of white matter, likely small vessel disease. Vascular: Calcification of the cavernous internal carotid arteries consistent with cerebrovascular atherosclerotic disease. No signs of intracranial large vessel occlusion. Skull: Intact Sinuses/Orbits: No acute findings. Other: No middle ear or mastoid fluid. IMPRESSION: Atrophy and small vessel disease similar to priors. No acute intracranial findings. Electronically Signed   By: Staci Righter M.D.   On: 12/11/2018 08:45   Dg Chest Port 1 View  Result Date: 12/11/2018 CLINICAL DATA:  Fatigue and confusion. Recently treated for bronchitis. EXAM: PORTABLE CHEST 1 VIEW COMPARISON:  September 02, 2018 FINDINGS: Stable mild cardiomegaly. The hila and mediastinum are normal. No pneumothorax. No nodules or masses. No focal infiltrates or overt edema. IMPRESSION: No active disease. Electronically Signed   By: Dorise Bullion III M.D   On: 12/11/2018 09:09    Pending Labs Unresulted Labs (From admission, onward)    Start     Ordered   12/18/18 0500  Creatinine, serum  (enoxaparin (LOVENOX)    CrCl >/= 30 ml/min)  Weekly,   STAT    Comments:  while on enoxaparin therapy    12/11/18 1041   12/12/18 7342  Basic metabolic panel  Tomorrow morning,   STAT     12/11/18 1041   12/12/18 0500  CBC  Tomorrow morning,   STAT     12/11/18 1041   12/11/18 0811  Urinalysis, Complete w Microscopic  Once,   STAT     12/11/18 0816          Vitals/Pain Today's Vitals   12/11/18 0844 12/11/18 0900 12/11/18 0930 12/11/18 1000  BP: (!) 179/96 (!) 210/102 (!) 204/105 (!) 184/101  Pulse: 94 94 97 91  Resp: 14 16 (!) 21 17  Temp:      TempSrc:      SpO2: 98% 97% 97% 96%  PainSc:        Isolation Precautions No active isolations  Medications Medications  0.9 %  sodium chloride infusion (has no administration in time range)  amLODipine (NORVASC) tablet 5 mg (has no  administration in time range)  meclizine (ANTIVERT) tablet 25 mg (has no administration in time range)  cloNIDine (CATAPRES) tablet 0.1 mg (has no administration in time range)  diltiazem (DILACOR XR) 24 hr capsule 120 mg (has no administration in time range)  hydrALAZINE (APRESOLINE) tablet 50 mg (has no administration in time range)  losartan (COZAAR) tablet 100 mg (has no administration in time range)  ALPRAZolam (XANAX) tablet 0.5 mg (has no administration in time range)  busPIRone (BUSPAR) tablet 10 mg (has no administration in time range)  levothyroxine (SYNTHROID) tablet 50 mcg (has no administration in time range)  pantoprazole (PROTONIX) EC tablet 40 mg (has no administration in time range)  magnesium oxide (MAG-OX) tablet 400 mg (has no administration in time range)  ferrous sulfate tablet 325 mg (has no administration in time range)  cholecalciferol (VITAMIN D) tablet 1,000 Units (has no administration in time range)  albuterol (ACCUNEB) nebulizer solution 3 mL (has no administration in time range)  mometasone-formoterol (DULERA) 200-5 MCG/ACT inhaler 2 puff (has no administration in time range)  loratadine (CLARITIN) tablet 10 mg (has no administration in time range)  LORazepam (ATIVAN) injection 0.5 mg (has no administration in time range)  enoxaparin (LOVENOX) injection 40 mg (has no administration in time range)  acetaminophen (TYLENOL) tablet 650 mg (has no administration in time range)    Or  acetaminophen (TYLENOL) suppository 650 mg (has no administration in time range)  ondansetron (ZOFRAN) tablet 4 mg (has no administration in time range)    Or  ondansetron (ZOFRAN) injection 4 mg (has no administration in time range)  cloNIDine (CATAPRES) tablet 0.1 mg (0.1 mg Oral Given 12/11/18 0927)  hydrALAZINE (APRESOLINE) tablet 50 mg (50 mg Oral Given 12/11/18 0927)  amLODipine (NORVASC) tablet 5 mg (5 mg Oral Given 12/11/18 3013)    Mobility walks with device Moderate fall  risk   Focused Assessments    R Recommendations: See Admitting Provider Note  Report given to:   Additional Notes:

## 2018-12-11 NOTE — ED Notes (Signed)
Pt returned from CT °

## 2018-12-11 NOTE — ED Triage Notes (Signed)
Pt arrives from home via [facility]. Just started on doxycycline for bronchicits (has cough per EMS). Lives with family at home who stated this morning pt was having trouble getting words out. No temp per EMS. 675/198 BP with EMS. HR 88, a fib EMS (a fib normal for pt). Hx COPD. CBG 106. EMS states pt began c/o of HA as well. LKW 9p last night. Family told EMS that pt woke up altered with speech changes.

## 2018-12-11 NOTE — H&P (Signed)
[facility] [place] NAME: Janice Perkins    MR#:  [id number]  DATE OF BIRTH:  03/19/1933  DATE OF ADMISSION:  12/11/2018  PRIMARY CARE PHYSICIAN: [name], MD   REQUESTING/REFERRING PHYSICIAN: Dr Delman Kitten  CHIEF COMPLAINT:   Chief Complaint  Patient presents with  . Altered Mental Status  . Cough    HISTORY OF PRESENT ILLNESS:  Janice Perkins  is a 85 y.o. female coming in with difficulty thinking.  She could not get her thoughts straight.  She woke up dizzy.  She has had episodes of vertigo in the past.  She also states her breathing was not good and she is nauseous and she took some Zofran.  She states that she is moving all of her extremities well.  She just thinks that she cannot think very well.  She was able to get all her words out and communicate with me but slow with her answers.  Hospitalist services were contacted for further evaluation.  In the ER her blood pressure was very high.  She states that her blood pressure has been high in the past.  Patient recently got some doxycycline.  PAST MEDICAL HISTORY:   Past Medical History:  Diagnosis Date  . Asthma   . Atrial fibrillation ([place])   . Breast cancer ([place]) 2011  . Bronchitis 04/2015  . Cancer [facility]) 2011   breast  . COPD (chronic obstructive pulmonary disease) ([place])   . Hypertension   . Shingles    10/2015    PAST SURGICAL HISTORY:   Past Surgical History:  Procedure Laterality Date  . BREAST EXCISIONAL BIOPSY Right 11/29/13   two areas FAT NECROSIS  . BREAST EXCISIONAL BIOPSY Right 10/30/2009   lumpectomy - radiation  . COLONOSCOPY WITH PROPOFOL N/A 06/10/2018   Procedure: COLONOSCOPY WITH PROPOFOL;  Surgeon: Manya Silvas, MD;  Location: [facility] ENDOSCOPY;  Service: Endoscopy;  Laterality: N/A;  . ESOPHAGOGASTRODUODENOSCOPY (EGD) WITH PROPOFOL N/A 06/10/2018   Procedure: ESOPHAGOGASTRODUODENOSCOPY (EGD) WITH PROPOFOL;  Surgeon: Manya Silvas, MD;   Location[facility] ENDOSCOPY;  Service: Endoscopy;  Laterality: N/A;  . NASAL SINUS SURGERY      SOCIAL HISTORY:   Social History   Tobacco Use  . Smoking status: Never Smoker  . Smokeless tobacco: Never Used  Substance Use Topics  . Alcohol use: No    Alcohol/week: 0.0 standard drinks    FAMILY HISTORY:   Family History  Problem Relation Age of Onset  . Hypertension Mother   . Heart disease Mother   . Cancer Mother   . Stroke Father   . Hypertension Father   . Breast cancer Sister     DRUG ALLERGIES:   Allergies  Allergen Reactions  . Diphenhydramine Hcl Rash  . Penicillins Hives    .Has patient had a PCN reaction causing immediate rash, facial/tongue/throat swelling, SOB or lightheadedness with hypotension: Unknown Has patient had a PCN reaction causing severe rash involving mucus membranes or skin necrosis: Unknown Has patient had a PCN reaction that required hospitalization: Unknown Has patient had a PCN reaction occurring within the last 10 years: Unknown If all of the above answers are "NO", then may proceed with Cephalosporin use.   . Captopril Other (See Comments)  . Enalapril Maleate Other (See Comments)    Other reaction(s): Unknown  . Tape Itching  . Terfenadine Other (See Comments)  . Augmentin [Amoxicillin-Pot Clavulanate] Rash    Has patient had a PCN reaction causing  immediate rash, facial/tongue/throat swelling, SOB or lightheadedness with hypotension: Unknown Has patient had a PCN reaction causing severe rash involving mucus membranes or skin necrosis: Unknown Has patient had a PCN reaction that required hospitalization: Unknown Has patient had a PCN reaction occurring within the last 10 years: Unknown If all of the above answers are "NO", then may proceed with Cephalosporin use.  . Biaxin [Clarithromycin] Rash, Other (See Comments) and Hives    Other reaction(s): Unknown    REVIEW OF SYSTEMS:  CONSTITUTIONAL: No fever, some cold shaking chills.   Positive for fatigue.  EYES: No blurred or double vision.  Some watery eyes. EARS, NOSE, AND THROAT: No tinnitus or ear pain. No sore throat.  Some runny nose. RESPIRATORY: Some cough, shortness of breath.  No wheezing or hemoptysis.  CARDIOVASCULAR: No chest pain, orthopnea, edema.  GASTROINTESTINAL: No vomiting, diarrhea or abdominal pain. No blood in bowel movements.  Some nausea.  History of constipation. GENITOURINARY: No dysuria, hematuria.  ENDOCRINE: No polyuria, nocturia,  HEMATOLOGY: No anemia, easy bruising or bleeding SKIN: No rash or lesion. MUSCULOSKELETAL: No joint pain or arthritis.   NEUROLOGIC: No tingling, numbness.  Positive for dizziness PSYCHIATRY: No anxiety or depression.   MEDICATIONS AT HOME:   Prior to Admission medications   Medication Sig Start Date End Date Taking? Authorizing Provider  albuterol (ACCUNEB) 1.25 MG/3ML nebulizer solution Inhale 3 mLs into the lungs every 6 (six) hours as needed for wheezing.    Yes [provider]  albuterol (PROVENTIL HFA;VENTOLIN HFA) 108 (90 Base) MCG/ACT inhaler Inhale 2 puffs into the lungs every 6 (six) hours as needed for wheezing or shortness of breath.    Yes [provider]  ALPRAZolam Duanne Moron) 0.5 MG tablet Take 0.5 mg by mouth daily as needed for anxiety or sleep.   Yes [provider]  atorvastatin (LIPITOR) 20 MG tablet Take 20 mg by mouth at bedtime.    Yes [provider]  busPIRone (BUSPAR) 10 MG tablet Take 10 mg by mouth 2 (two) times daily.   Yes [provider]  cholecalciferol (VITAMIN D) 1000 units tablet Take 1,000 Units by mouth daily.   Yes [provider]  cloNIDine (CATAPRES) 0.1 MG tablet Take 0.1 mg by mouth 2 (two) times daily as needed (SBP >170).   Yes [provider]  diltiazem (DILACOR XR) 120 MG 24 hr capsule Take 120 mg by mouth daily.   Yes [provider]  doxycycline (VIBRAMYCIN) 100 MG capsule Take 100 mg by mouth 2  (two) times daily. 12/10/18 12/17/18 Yes [provider]  esomeprazole (NEXIUM) 40 MG capsule Take 40 mg by mouth 2 (two) times daily before a meal.    Yes [provider]  ferrous sulfate 325 (65 FE) MG tablet Take 325 mg by mouth daily with breakfast.   Yes [provider]  Fluticasone-Salmeterol (ADVAIR) 250-50 MCG/DOSE AEPB Inhale 1 puff into the lungs 2 (two) times daily.   Yes [provider]  hydrALAZINE (APRESOLINE) 50 MG tablet Take 50 mg by mouth 3 (three) times daily.   Yes [provider]  HYDROcodone-acetaminophen (NORCO/VICODIN) 5-325 MG tablet Take 1 tablet by mouth 2 (two) times daily as needed for moderate pain or severe pain.    Yes [provider]  levothyroxine (SYNTHROID, LEVOTHROID) 50 MCG tablet Take 50 mcg by mouth daily before breakfast.   Yes [provider]  loratadine (CLARITIN) 10 MG tablet Take 10 mg by mouth daily.   Yes [provider]  losartan (COZAAR) 50 MG tablet Take 100 mg by mouth daily.   Yes [provider]  magnesium oxide (MAG-OX) 400 MG tablet Take 400 mg by mouth 2 (two) times daily.   Yes [provider]  methocarbamol (ROBAXIN) 500 MG tablet Take 500 mg by mouth every 6 (six) hours as needed for muscle spasms.   Yes [provider]  mometasone-formoterol (DULERA) 100-5 MCG/ACT AERO Inhale 2 puffs into the lungs 2 (two) times daily.   Yes [provider]  amLODipine (NORVASC) 5 MG tablet Take 1 tablet (5 mg total) by mouth daily. Patient not taking: Reported on 12/11/2018 09/09/18   MayoPete Pelt, MD  meclizine (ANTIVERT) 25 MG tablet Take 1 tablet (25 mg total) by mouth 2 (two) times daily as needed for dizziness. Patient not taking: Reported on 12/11/2018 09/16/18   Merlyn Lot, MD      VITAL SIGNS:  Blood pressure (!) 184/101, pulse 91, temperature 99.1 F (37.3 C), temperature source Oral, resp. rate 17, SpO2 96 %.  PHYSICAL EXAMINATION:   GENERAL:  85 y.o.-year-old patient lying in the bed with no acute distress.  EYES: Pupils equal, round, reactive to light and accommodation. No scleral icterus. Extraocular muscles intact.  Positive for lateral nystagmus with extraocular muscle movement HEENT: Head atraumatic, normocephalic. Oropharynx and nasopharynx clear.  Tympanic membrane no erythema NECK:  Supple, no jugular venous distention. No thyroid enlargement, no tenderness.  LUNGS: Normal breath sounds bilaterally, no wheezing, rales,rhonchi or crepitation. No use of accessory muscles of respiration.  CARDIOVASCULAR: S1, S2 normal. No murmurs, rubs, or gallops.  ABDOMEN: Soft, nontender, nondistended. Bowel sounds present. No organomegaly or mass.  EXTREMITIES: Trace pedal edema, no cyanosis, or clubbing.  NEUROLOGIC: Cranial nerves II through XII are intact. Muscle strength 5/5 in all extremities. Sensation intact. Gait not checked.  PSYCHIATRIC: The patient is alert and oriented x 3.  SKIN: No rash, lesion, or ulcer.   LABORATORY PANEL:   CBC Recent Labs  Lab 12/11/18 0752  WBC 6.1  HGB 15.0  HCT 43.7  PLT 192   ------------------------------------------------------------------------------------------------------------------  Chemistries  Recent Labs  Lab 12/11/18 0752  NA 136  K 3.9  CL 100  CO2 28  GLUCOSE 110*  BUN 20  CREATININE 0.92  CALCIUM 9.4  AST 27  ALT 22  ALKPHOS 57  BILITOT 0.8   ------------------------------------------------------------------------------------------------------------------  Cardiac Enzymes Recent Labs  Lab 12/11/18 0752  TROPONINI <0.03   ------------------------------------------------------------------------------------------------------------------  RADIOLOGY:  Ct Head Wo Contrast  Result Date: 12/11/2018 CLINICAL DATA:  Speech difficulty developed earlier today. Elevated blood pressure. History of atrial fibrillation. Breast cancer. EXAM: CT HEAD WITHOUT  CONTRAST TECHNIQUE: Contiguous axial images were obtained from the base of the skull through the vertex without intravenous contrast. COMPARISON:  CT head 09/02/2018. MR head 09/16/2018. FINDINGS: Brain: No evidence of acute infarction, hemorrhage, hydrocephalus, extra-axial collection or mass lesion/mass effect. Mild to moderate cerebral and cerebellar atrophy, unchanged from priors, not unexpected for age. Mild hypoattenuation of white matter, likely small vessel disease. Vascular: Calcification of the cavernous internal carotid arteries consistent with cerebrovascular atherosclerotic disease. No signs of intracranial large vessel occlusion. Skull: Intact Sinuses/Orbits: No acute findings. Other: No middle ear or mastoid fluid. IMPRESSION: Atrophy and small vessel disease similar to priors. No acute intracranial findings. Electronically Signed   By: Staci Righter M.D.   On: 12/11/2018 08:45   Dg Chest Port 1 View  Result Date: 12/11/2018 CLINICAL DATA:  Fatigue and confusion.  Recently treated for bronchitis. EXAM: PORTABLE CHEST 1 VIEW COMPARISON:  September 02, 2018 FINDINGS: Stable mild cardiomegaly. The hila and mediastinum are normal. No pneumothorax. No nodules or masses. No focal infiltrates or overt edema. IMPRESSION: No active disease. Electronically Signed   By: Dorise Bullion III M.D   On: 12/11/2018 09:09    EKG:   Atrial fibrillation 85 bpm, Q waves septally  IMPRESSION AND PLAN:   1.  Hypertensive encephalopathy.  Start with her usual meds and a clonidine standing dose.  Since her blood pressure has been high in the past we will try to get this down a little more slowly. 2.  Vertigo with nystagmus.  We will get an MRI of the brain with thin cuts through the ear.  Standing dose meclizine for right now and gentle IV fluids. 3.  Atrial fibrillation.  Patient takes aspirin only for anticoagulation because she had nosebleeds with major anticoagulation.  On Cardizem for rate control. 4.   COPD.  No exacerbation.  Stop the prednisone and the doxycycline.  Continue inhalers at this point 5.  History of breast cancer 6.  Anxiety on Xanax and BuSpar 7.  Hyperlipidemia hold atorvastatin for right now 8.  Hypothyroidism unspecified continue Synthroid and check a TSH     All the records are reviewed and case discussed with ED provider. Management plans discussed with the patient, family and they are in agreement.  CODE STATUS: Full code  TOTAL TIME TAKING CARE OF THIS PATIENT: 50 minutes.    Loletha Grayer M.D on 12/11/2018 at 10:43 AM  Between 7am to 6pm - Pager - [phone]  After 6pm call admission pager [phone]  [facility] Office  [phone]  CC: Primary care physician; [name], MD

## 2018-12-12 ENCOUNTER — Inpatient Hospital Stay: Payer: Medicare Other

## 2018-12-12 ENCOUNTER — Inpatient Hospital Stay
Admit: 2018-12-12 | Discharge: 2018-12-12 | Disposition: A | Discharge status: Home / Self Care | Payer: Medicare Other | Attending: Internal Medicine | Admitting: Internal Medicine

## 2018-12-12 DIAGNOSIS — R4182 Altered mental status, unspecified: Secondary | ICD-10-CM

## 2018-12-12 DIAGNOSIS — I63412 Cerebral infarction due to embolism of left middle cerebral artery: Secondary | ICD-10-CM

## 2018-12-12 LAB — CBC
HCT: 40.1 % (ref 36.0–46.0)
Hemoglobin: 13.5 g/dL (ref 12.0–15.0)
MCH: 31.5 pg (ref 26.0–34.0)
MCHC: 33.7 g/dL (ref 30.0–36.0)
MCV: 93.5 fL (ref 80.0–100.0)
Platelets: 177 10*3/uL (ref 150–400)
RBC: 4.29 MIL/uL (ref 3.87–5.11)
RDW: 13.4 % (ref 11.5–15.5)
WBC: 7.2 10*3/uL (ref 4.0–10.5)
nRBC: 0 % (ref 0.0–0.2)

## 2018-12-12 LAB — LIPID PANEL
Cholesterol: 177 mg/dL (ref 0–200)
HDL: 63 mg/dL (ref >40)
LDL Cholesterol: 102 mg/dL — ABNORMAL HIGH (ref 0–99)
Total CHOL/HDL Ratio: 2.8 RATIO
Triglycerides: 59 mg/dL (ref <150)
VLDL: 12 mg/dL (ref 0–40)

## 2018-12-12 LAB — BASIC METABOLIC PANEL
Anion gap: 6 (ref 5–15)
BUN: 18 mg/dL (ref 8–23)
CO2: 28 mmol/L (ref 22–32)
Calcium: 8.9 mg/dL (ref 8.9–10.3)
Chloride: 99 mmol/L (ref 98–111)
Creatinine, Ser: 1.03 mg/dL — ABNORMAL HIGH (ref 0.44–1.00)
GFR calc Af Amer: 58 mL/min — ABNORMAL LOW (ref >60)
GFR calc non Af Amer: 50 mL/min — ABNORMAL LOW (ref >60)
Glucose, Bld: 106 mg/dL — ABNORMAL HIGH (ref 70–99)
Potassium: 4.4 mmol/L (ref 3.5–5.1)
Sodium: 133 mmol/L — ABNORMAL LOW (ref 135–145)

## 2018-12-12 LAB — VITAMIN B12: Vitamin B-12: 576 pg/mL (ref 180–914)

## 2018-12-12 MED ORDER — IOHEXOL 350 MG/ML SOLN
75.0000 mL | Freq: Once | INTRAVENOUS | Status: AC | PRN
  Administered 2018-12-12: 12:00:00 75 mL via INTRAVENOUS

## 2018-12-12 MED ORDER — HYDROCODONE-ACETAMINOPHEN 5-325 MG PO TABS
1.0000 | ORAL_TABLET | Freq: Four times a day (QID) | ORAL | Status: DC | PRN
  Administered 2018-12-12: 21:00:00 1 via ORAL
  Filled 2018-12-12: qty 1

## 2018-12-12 MED ORDER — APIXABAN 5 MG PO TABS
5.0000 mg | ORAL_TABLET | Freq: Two times a day (BID) | ORAL | Status: DC
  Administered 2018-12-12 – 2018-12-13 (×2): 5 mg via ORAL
  Filled 2018-12-12 (×2): qty 1

## 2018-12-12 MED ORDER — HYDRALAZINE HCL 50 MG PO TABS
25.0000 mg | ORAL_TABLET | Freq: Three times a day (TID) | ORAL | Status: DC
  Administered 2018-12-12 (×2): 25 mg via ORAL
  Filled 2018-12-12 (×2): qty 1

## 2018-12-12 NOTE — Evaluation (Signed)
Occupational Therapy Evaluation Patient Details Name: Janice Perkins MRN: [id number] DOB: 01-May-1933 Today's Date: 12/12/2018    History of Present Illness Patient is an 85 y/o female with a history of afib on ASA who presents with altered mental status. MRI of the brain reviewed and shows a small, acute left parietal infarct.    Clinical Impression   Pt seen for OT evaluation this date. Pt pleasant and agreeable to participating. Pt very verbose throughout session, requiring cues to redirect to task. Pt reports 5/10 headache. Pt able to perform bed mobility with modified independence, CGA for lateral scoots/transfers. Grossly CGA for LB ADL tasks with transitional movements. Additional therapy deferred 2/2 echocardiogram. Pt will benefit from skilled OT services to maximize return to PLOF, minimize falls, and minimize caregiver burden. Recommend HHOT services and initial 24/7 sup for safety.    Follow Up Recommendations  Home health OT;Supervision/Assistance - 24 hour    Equipment Recommendations  None recommended by OT    Recommendations for Other Services       Precautions / Restrictions Precautions Precautions: Fall Restrictions Weight Bearing Restrictions: No      Mobility Bed Mobility Overal bed mobility: Independent                Transfers Overall transfer level: Needs assistance   Transfers: Lateral/Scoot Transfers          Lateral/Scoot Transfers: Supervision      Balance Overall balance assessment: No apparent balance deficits (not formally assessed)                                         ADL either performed or assessed with clinical judgement   ADL Overall ADL's : Needs assistance/impaired Eating/Feeding: Sitting;Independent   Grooming: Sitting;Independent   Upper Body Bathing: Sitting;Set up;Supervision/ safety   Lower Body Bathing: Sit to/from stand;Min guard   Upper Body Dressing : Sitting;Set up;Supervision/safety    Lower Body Dressing: Sit to/from stand;Min guard   Toilet Transfer: Ambulation;BSC;Min guard                   Vision Patient Visual Report: No change from baseline Vision Assessment?: No apparent visual deficits     Perception     Praxis      Pertinent Vitals/Pain Pain Assessment: 0-10 Pain Score: 5  Pain Location: headache Pain Descriptors / Indicators: Headache Pain Intervention(s): Limited activity within patient's tolerance;Monitored during session;Premedicated before session;Repositioned     Hand Dominance Right   Extremity/Trunk Assessment Upper Extremity Assessment Upper Extremity Assessment: Generalized weakness(L shoulder grossly 4-/5 vs R 4/5, otherwise generally weak; impaired coordination bilaterally, sensation intact)   Lower Extremity Assessment Lower Extremity Assessment: Defer to PT evaluation;Generalized weakness(sensation intact, grossly weak bilaterally)   Cervical / Trunk Assessment Cervical / Trunk Assessment: Normal   Communication Communication Communication: No difficulties   Cognition Arousal/Alertness: Awake/alert Behavior During Therapy: WFL for tasks assessed/performed Overall Cognitive Status: Within Functional Limits for tasks assessed                                 General Comments: very verbose, requires cues to redirect, follows complex commands with slightly increased time and cues, alert and oriented x4   General Comments       Exercises Other Exercises Other Exercises: pt instructed in falls prevention strategies  Shoulder Instructions      Home Living Family/patient expects to be discharged to:: Private residence Living Arrangements: Spouse/significant other;Children Available Help at Discharge: Family Type of Home: House Home Access: Level entry     Home Layout: One level     Bathroom Shower/Tub: Teacher, early years/pre: Handicapped height     Home Equipment: Environmental consultant - 2  wheels;Tub bench;Bedside commode;Grab bars - tub/shower;Walker - 4 wheels   Additional Comments: 1 small step inside the house, back side      Prior Functioning/Environment Level of Independence: Independent        Comments: Per previous therapy note, pt was independent w/ all ADL and driving, daughter helps with cooking        OT Problem List: Decreased strength;Decreased coordination;Decreased knowledge of use of DME or AE;Decreased safety awareness      OT Treatment/Interventions: Self-care/ADL training;Therapeutic exercise;Therapeutic activities;Neuromuscular education;Cognitive remediation/compensation;DME and/or AE instruction;Patient/family education    OT Goals(Current goals can be found in the care plan section) Acute Rehab OT Goals Patient Stated Goal: go home OT Goal Formulation: With patient Time For Goal Achievement: 12/26/18 Potential to Achieve Goals: Good ADL Goals Pt Will Perform Lower Body Dressing: with supervision;sit to/from stand Pt Will Transfer to Toilet: with supervision;ambulating;regular height toilet(LRAD for amb)  OT Frequency: Min 1X/week   Barriers to D/C:            Co-evaluation              AM-PAC OT "6 Clicks" Daily Activity     Outcome Measure Help from another person eating meals?: None Help from another person taking care of personal grooming?: None Help from another person toileting, which includes using toliet, bedpan, or urinal?: A Little Help from another person bathing (including washing, rinsing, drying)?: A Little Help from another person to put on and taking off regular upper body clothing?: A Little Help from another person to put on and taking off regular lower body clothing?: A Little 6 Click Score: 20   End of Session    Activity Tolerance: Patient tolerated treatment well Patient left: in bed;with call bell/phone within reach;Other (comment)(cardiology in room for echo, bed alarm left off per cardiology request  for optimal bed positioning for echo)  OT Visit Diagnosis: Other abnormalities of gait and mobility (R26.89);Muscle weakness (generalized) (M62.81);Other symptoms and signs involving cognitive function                Time: 6384-5364 OT Time Calculation (min): 23 min Charges:  OT General Charges $OT Visit: 1 Visit OT Evaluation $OT Eval Low Complexity: 1 Low  Jeni Salles, MPH, MS, OTR/L ascom [phone] 12/12/18, 10:28 AM

## 2018-12-12 NOTE — Evaluation (Signed)
Physical Therapy Evaluation Patient Details Name: Janice Perkins MRN: [id number] DOB: 1933/04/27 Today's Date: 12/12/2018   History of Present Illness  Patient is a pleasant 85 y/o female with a history of afib on ASA who presents with altered mental status and cough, reporting she woke up dizzy and "can't think well". MRI of the brain reviewed and shows a small, acute left parietal infarct.  PMH includes asthma, a fib, breast cancer, COPD, HTN, AKI, vertigo, leukocytosis, AF, and lumbar radiculopathy.   Clinical Impression  Patient is a pleasant 85 year old female who presents with generalized instability and limited capacity for prolonged mobility. Patient is very verbose and frequently requires re-direction/re-orientation for task orientation. Patient has a history of vertigo and demonstrates limited capacity for dual tasking resulting in need for single limb support with dynamic stability. Patient ambulated short distance however stated that was enough and wanted to return to room. Was eager to participate in therapy however did seem fatigued after ambulation indicating limited capacity for prolonged mobility. Patient will benefit from skilled phsyical therapy to increase stability and decrease falls risk while hospitalized. Upon discharge patient will benefit from home health physical therapy for strength and stability to decrease falls risk and improve mobility in home environment.     Follow Up Recommendations Home health PT    Equipment Recommendations  None recommended by PT    Recommendations for Other Services       Precautions / Restrictions Precautions Precautions: Fall Restrictions Weight Bearing Restrictions: No      Mobility  Bed Mobility Overal bed mobility: Independent                Transfers Overall transfer level: Needs assistance   Transfers: Sit to/from Stand Sit to Stand: Supervision;Min guard        Lateral/Scoot Transfers: Supervision General  transfer comment: Patient requires CGA due to history of vertigo and instability.   Ambulation/Gait Ambulation/Gait assistance: Min guard Gait Distance (Feet): 50 Feet Assistive device: IV Pole Gait Pattern/deviations: Narrow base of support;Drifts right/left;Decreased stride length Gait velocity: decreased,    General Gait Details: limited ability to dual task.   Stairs            Wheelchair Mobility    Modified Rankin (Stroke Patients Only)       Balance Overall balance assessment: Needs assistance Sitting-balance support: Feet supported Sitting balance-Leahy Scale: Good Sitting balance - Comments: reach inside and outside BOS without LOB     Standing balance-Leahy Scale: Fair Standing balance comment: Patient able to ambulate with one hand on IV pole pushing it, limited ability to static stand due to sway of trunk,                High Level Balance Comments: unable to dual task (conversate and ambulate) has to stop and touch object to verbalize             Pertinent Vitals/Pain Pain Assessment: 0-10 Pain Score: 5  Pain Location: headache Pain Descriptors / Indicators: Headache Pain Intervention(s): Monitored during session;Repositioned    Home Living Family/patient expects to be discharged to:: Private residence Living Arrangements: Spouse/significant other;Children Available Help at Discharge: Family Type of Home: House Home Access: Level entry     Home Layout: One level Home Equipment: Environmental consultant - 2 wheels;Tub bench;Bedside commode;Grab bars - tub/shower;Walker - 4 wheels Additional Comments: 1 small step inside the house, back side, daughter and son live with patient and her husband    Prior Function Level  of Independence: Independent         Comments: Per previous therapy note, pt was independent w/ all ADL and driving, daughter helps with cooking     Hand Dominance   Dominant Hand: Right    Extremity/Trunk Assessment   Upper  Extremity Assessment Upper Extremity Assessment: Defer to OT evaluation    Lower Extremity Assessment Lower Extremity Assessment: RLE deficits/detail;LLE deficits/detail RLE Deficits / Details: 4-/5  RLE Sensation: WNL RLE Coordination: decreased gross motor LLE Deficits / Details: grossly 4-/5  LLE Sensation: WNL LLE Coordination: decreased gross motor    Cervical / Trunk Assessment Cervical / Trunk Assessment: Normal  Communication   Communication: No difficulties(verbose)  Cognition Arousal/Alertness: Awake/alert Behavior During Therapy: WFL for tasks assessed/performed Overall Cognitive Status: Within Functional Limits for tasks assessed                                 General Comments: Patient is very verbose requiring cueing for redirection and task orientation.       General Comments General comments (skin integrity, edema, etc.): no noted bruising/edema     Exercises Other Exercises Other Exercises: patient educated/instructed in safe transfers and mobility    Assessment/Plan    PT Assessment Patient needs continued PT services  PT Problem List Decreased strength;Decreased activity tolerance;Decreased coordination;Decreased mobility;Decreased balance;Decreased safety awareness       PT Treatment Interventions DME instruction;Gait training;Stair training;Therapeutic exercise;Therapeutic activities;Functional mobility training;Balance training;Neuromuscular re-education;Patient/family education    PT Goals (Current goals can be found in the Care Plan section)  Acute Rehab PT Goals Patient Stated Goal: to return home PT Goal Formulation: With patient Time For Goal Achievement: 12/26/18 Potential to Achieve Goals: Good    Frequency Min 2X/week   Barriers to discharge   would benefit from home health due to instability    Co-evaluation               AM-PAC PT "6 Clicks" Mobility  Outcome Measure Help needed turning from your back to  your side while in a flat bed without using bedrails?: None Help needed moving from lying on your back to sitting on the side of a flat bed without using bedrails?: None Help needed moving to and from a bed to a chair (including a wheelchair)?: A Little Help needed standing up from a chair using your arms (e.g., wheelchair or bedside chair)?: A Little Help needed to walk in hospital room?: A Little Help needed climbing 3-5 steps with a railing? : A Little 6 Click Score: 20    End of Session Equipment Utilized During Treatment: Gait belt Activity Tolerance: Patient tolerated treatment well Patient left: in chair;with call bell/phone within reach;with chair alarm set Nurse Communication: Mobility status PT Visit Diagnosis: Unsteadiness on feet (R26.81);Other abnormalities of gait and mobility (R26.89);Muscle weakness (generalized) (M62.81);Dizziness and giddiness (R42)    Time: 2878-6767 PT Time Calculation (min) (ACUTE ONLY): 24 min   Charges:   PT Evaluation $PT Eval Low Complexity: 1 Low PT Treatments $Gait Training: 8-22 mins $Therapeutic Activity: 8-22 mins       Janna Arch, PT, DPT    Janna Arch 12/12/2018, 12:07 PM

## 2018-12-12 NOTE — Progress Notes (Signed)
Subjective States feels better no vertigo/dizziness   Past Medical History:  Diagnosis Date  . Asthma   . Atrial fibrillation ([place])   . Breast cancer ([place]) 2011  . Bronchitis 04/2015  . Cancer [facility]) 2011   breast  . COPD (chronic obstructive pulmonary disease) ([place])   . Hypertension   . Shingles    10/2015    Past Surgical History:  Procedure Laterality Date  . BREAST EXCISIONAL BIOPSY Right 11/29/13   two areas FAT NECROSIS  . BREAST EXCISIONAL BIOPSY Right 10/30/2009   lumpectomy - radiation  . COLONOSCOPY WITH PROPOFOL N/A 06/10/2018   Procedure: COLONOSCOPY WITH PROPOFOL;  Surgeon: Manya Silvas, MD;  Location: [facility] ENDOSCOPY;  Service: Endoscopy;  Laterality: N/A;  . ESOPHAGOGASTRODUODENOSCOPY (EGD) WITH PROPOFOL N/A 06/10/2018   Procedure: ESOPHAGOGASTRODUODENOSCOPY (EGD) WITH PROPOFOL;  Surgeon: Manya Silvas, MD;  Location: [facility] ENDOSCOPY;  Service: Endoscopy;  Laterality: N/A;  . NASAL SINUS SURGERY      Family History  Problem Relation Age of Onset  . Hypertension Mother   . Heart disease Mother   . Cancer Mother   . Stroke Father   . Hypertension Father   . Breast cancer Sister    Social History:  reports that she has never smoked. She has never used smokeless tobacco. She reports that she does not drink alcohol or use drugs.  Allergies:  Allergies  Allergen Reactions  . Diphenhydramine Hcl Rash  . Penicillins Hives    .Has patient had a PCN reaction causing immediate rash, facial/tongue/throat swelling, SOB or lightheadedness with hypotension: Unknown Has patient had a PCN reaction causing severe rash involving mucus membranes or skin necrosis: Unknown Has patient had a PCN reaction that required hospitalization: Unknown Has patient had a PCN reaction occurring within the last 10 years: Unknown If all of the above answers are "NO", then may proceed with Cephalosporin use.   . Captopril Other (See Comments)  . Enalapril Maleate Other (See Comments)     Other reaction(s): Unknown  . Tape Itching  . Terfenadine Other (See Comments)  . Augmentin [Amoxicillin-Pot Clavulanate] Rash    Has patient had a PCN reaction causing immediate rash, facial/tongue/throat swelling, SOB or lightheadedness with hypotension: Unknown Has patient had a PCN reaction causing severe rash involving mucus membranes or skin necrosis: Unknown Has patient had a PCN reaction that required hospitalization: Unknown Has patient had a PCN reaction occurring within the last 10 years: Unknown If all of the above answers are "NO", then may proceed with Cephalosporin use.  . Biaxin [Clarithromycin] Rash, Other (See Comments) and Hives    Other reaction(s): Unknown    Medications:  I have reviewed the patient's current medications. Prior to Admission:  Medications Prior to Admission  Medication Sig Dispense Refill Last Dose  . albuterol (ACCUNEB) 1.25 MG/3ML nebulizer solution Inhale 3 mLs into the lungs every 6 (six) hours as needed for wheezing.    Unknown at PRN  . albuterol (PROVENTIL HFA;VENTOLIN HFA) 108 (90 Base) MCG/ACT inhaler Inhale 2 puffs into the lungs every 6 (six) hours as needed for wheezing or shortness of breath.    Unknown at PRN  . ALPRAZolam (XANAX) 0.5 MG tablet Take 0.5 mg by mouth daily as needed for anxiety or sleep.   Unknown at PRN  . atorvastatin (LIPITOR) 20 MG tablet Take 20 mg by mouth at bedtime.    Past Week at Unknown time  . busPIRone (BUSPAR) 10 MG tablet Take 10 mg by mouth 2 (two)  times daily.   Past Week at Unknown time  . cholecalciferol (VITAMIN D) 1000 units tablet Take 1,000 Units by mouth daily.   Past Week at Unknown time  . cloNIDine (CATAPRES) 0.1 MG tablet Take 0.1 mg by mouth 2 (two) times daily as needed (SBP >170).   Unknown at PRN  . diltiazem (DILACOR XR) 120 MG 24 hr capsule Take 120 mg by mouth daily.   Past Week at Unknown time  . doxycycline (VIBRAMYCIN) 100 MG capsule Take 100 mg by mouth 2 (two) times daily.    12/10/2018 at Unknown time  . esomeprazole (NEXIUM) 40 MG capsule Take 40 mg by mouth 2 (two) times daily before a meal.    Past Week at Unknown time  . ferrous sulfate 325 (65 FE) MG tablet Take 325 mg by mouth daily with breakfast.   Past Week at Unknown time  . Fluticasone-Salmeterol (ADVAIR) 250-50 MCG/DOSE AEPB Inhale 1 puff into the lungs 2 (two) times daily.   Past Week at Unknown time  . hydrALAZINE (APRESOLINE) 50 MG tablet Take 50 mg by mouth 3 (three) times daily.   Past Week at Unknown time  . HYDROcodone-acetaminophen (NORCO/VICODIN) 5-325 MG tablet Take 1 tablet by mouth 2 (two) times daily as needed for moderate pain or severe pain.    Unknown at PRN  . levothyroxine (SYNTHROID, LEVOTHROID) 50 MCG tablet Take 50 mcg by mouth daily before breakfast.   Past Week at Unknown time  . loratadine (CLARITIN) 10 MG tablet Take 10 mg by mouth daily.   Past Week at Unknown time  . losartan (COZAAR) 50 MG tablet Take 100 mg by mouth daily.   Past Week at Unknown time  . magnesium oxide (MAG-OX) 400 MG tablet Take 400 mg by mouth 2 (two) times daily.   Past Week at Unknown time  . methocarbamol (ROBAXIN) 500 MG tablet Take 500 mg by mouth every 6 (six) hours as needed for muscle spasms.   Unknown at PRN  . mometasone-formoterol (DULERA) 100-5 MCG/ACT AERO Inhale 2 puffs into the lungs 2 (two) times daily.   Past Week at Unknown time  . amLODipine (NORVASC) 5 MG tablet Take 1 tablet (5 mg total) by mouth daily. (Patient not taking: Reported on 12/11/2018) 30 tablet 0 Not Taking at Unknown time  . meclizine (ANTIVERT) 25 MG tablet Take 1 tablet (25 mg total) by mouth 2 (two) times daily as needed for dizziness. (Patient not taking: Reported on 12/11/2018) 6 tablet 0 Completed Course at Unknown time   Scheduled: . amLODipine  5 mg Oral Daily  . atorvastatin  40 mg Oral q1800  . busPIRone  10 mg Oral BID  . cholecalciferol  1,000 Units Oral Daily  . clopidogrel  75 mg Oral Daily  . diltiazem  120 mg  Oral Daily  . enoxaparin (LOVENOX) injection  40 mg Subcutaneous Q24H  . ferrous sulfate  325 mg Oral Q breakfast  . hydrALAZINE  50 mg Oral TID  . levothyroxine  50 mcg Oral Q0600  . loratadine  10 mg Oral Daily  . losartan  100 mg Oral QHS  . magnesium oxide  400 mg Oral BID  . mometasone-formoterol  2 puff Inhalation BID  . pantoprazole  40 mg Oral Daily    ROS: History obtained from the patient  General ROS: negative for - chills, fatigue, fever, night sweats, weight gain or weight loss Psychological ROS: confusion Ophthalmic ROS: negative for - blurry vision, double vision, eye pain  or loss of vision ENT ROS: vertigo, nose stuffy Allergy and Immunology ROS: negative for - hives or itchy/watery eyes Hematological and Lymphatic ROS: negative for - bleeding problems, bruising or swollen lymph nodes Endocrine ROS: negative for - galactorrhea, hair pattern changes, polydipsia/polyuria or temperature intolerance Respiratory ROS: negative for - cough, hemoptysis, shortness of breath or wheezing Cardiovascular ROS: negative for - chest pain, dyspnea on exertion, edema or irregular heartbeat Gastrointestinal ROS: nausea Genito-Urinary ROS: negative for - dysuria, hematuria, incontinence or urinary frequency/urgency Musculoskeletal ROS: negative for - joint swelling or muscular weakness Neurological ROS: as noted in HPI Dermatological ROS: negative for rash and skin lesion changes  Physical Examination: Blood pressure (!) 150/77, pulse 62, temperature 98.4 F (36.9 C), temperature source Oral, resp. rate 17, height 5' (1.524 m), weight 69.4 kg, SpO2 99 %.  HEENT-  Normocephalic, no lesions, without obvious abnormality.  Normal external eye and conjunctiva.  Normal TM's bilaterally.  Normal auditory canals and external ears. Normal external nose, mucus membranes and septum.  Normal pharynx. Cardiovascular- S1, S2 normal, pulses palpable throughout   Lungs- chest clear, no wheezing,  rales, normal symmetric air entry Abdomen- soft, non-tender; bowel sounds normal; no masses,  no organomegaly Extremities- no edema Lymph-no adenopathy palpable Musculoskeletal-no joint tenderness, deformity or swelling Skin-warm and dry, no hyperpigmentation, vitiligo, or suspicious lesions  Neurological Examination   Mental Status: Alert and awake.  Oriented.  Tangential, rambling.  Speech fluent without evidence of aphasia.  Able to follow 3 step commands without difficulty. Cranial Nerves: II: Discs flat bilaterally; Visual fields grossly normal, pupils equal, round, reactive to light and accommodation III,IV, VI: ptosis not present, extra-ocular motions intact bilaterally V,VII: smile symmetric, facial light touch sensation normal bilaterally VIII: hearing normal bilaterally IX,X: gag reflex present XI: bilateral shoulder shrug XII: midline tongue extension Motor: Right : Upper extremity   5/5    Left:     Upper extremity   5/5  Lower extremity   5/5     Lower extremity   5/5 Tone and bulk:normal tone throughout; no atrophy noted Sensory: Pinprick and light touch intact throughout, bilaterally Deep Tendon Reflexes: Symmetric throughout Plantars: Right: downgoing   Left: downgoing Cerebellar: Normal finger-to-nose and normal heel-to-shin testing bilaterally Gait: not tested due to safety concerns    Laboratory Studies:  Basic Metabolic Panel: Recent Labs  Lab 12/11/18 0752 12/12/18 0451  NA 136 133*  K 3.9 4.4  CL 100 99  CO2 28 28  GLUCOSE 110* 106*  BUN 20 18  CREATININE 0.92 1.03*  CALCIUM 9.4 8.9    Liver Function Tests: Recent Labs  Lab 12/11/18 0752  AST 27  ALT 22  ALKPHOS 57  BILITOT 0.8  PROT 7.3  ALBUMIN 3.8   No results for input(s): LIPASE, AMYLASE in the last 168 hours. No results for input(s): AMMONIA in the last 168 hours.  CBC: Recent Labs  Lab 12/11/18 0752 12/12/18 0451  WBC 6.1 7.2  NEUTROABS 3.3  --   HGB 15.0 13.5  HCT 43.7  40.1  MCV 92.2 93.5  PLT 192 177    Cardiac Enzymes: Recent Labs  Lab 12/11/18 0752  TROPONINI <0.03    BNP: Invalid input(s): POCBNP  CBG: No results for input(s): GLUCAP in the last 168 hours.  Microbiology: Results for orders placed or performed during the hospital encounter of 12/11/18  SARS Coronavirus 2 [facility] order, Performed in [facility] hospital lab)     Status: None   Collection Time:  12/11/18  7:52 AM  Result Value Ref Range Status   SARS Coronavirus 2 NEGATIVE NEGATIVE Final    Comment: (NOTE) If result is NEGATIVE SARS-CoV-2 target nucleic acids are NOT DETECTED. The SARS-CoV-2 RNA is generally detectable in upper and lower  respiratory specimens during the acute phase of infection. The lowest  concentration of SARS-CoV-2 viral copies this assay can detect is 250  copies / mL. A negative result does not preclude SARS-CoV-2 infection  and should not be used as the sole basis for treatment or other  patient management decisions.  A negative result may occur with  improper specimen collection / handling, submission of specimen other  than nasopharyngeal swab, presence of viral mutation(s) within the  areas targeted by this assay, and inadequate number of viral copies  (<250 copies / mL). A negative result must be combined with clinical  observations, patient history, and epidemiological information. If result is POSITIVE SARS-CoV-2 target nucleic acids are DETECTED. The SARS-CoV-2 RNA is generally detectable in upper and lower  respiratory specimens dur ing the acute phase of infection.  Positive  results are indicative of active infection with SARS-CoV-2.  Clinical  correlation with patient history and other diagnostic information is  necessary to determine patient infection status.  Positive results do  not rule out bacterial infection or co-infection with other viruses. If result is PRESUMPTIVE POSTIVE SARS-CoV-2 nucleic acids MAY BE PRESENT.   A  presumptive positive result was obtained on the submitted specimen  and confirmed on repeat testing.  While 2019 novel coronavirus  (SARS-CoV-2) nucleic acids may be present in the submitted sample  additional confirmatory testing may be necessary for epidemiological  and / or clinical management purposes  to differentiate between  SARS-CoV-2 and other Sarbecovirus currently known to infect humans.  If clinically indicated additional testing with an alternate test  methodology [phone]) is advised. The SARS-CoV-2 RNA is generally  detectable in upper and lower respiratory sp ecimens during the acute  phase of infection. The expected result is Negative. Fact Sheet for Patients:  StrictlyIdeas.no Fact Sheet for Healthcare Providers: BankingDealers.co.za This test is not yet approved or cleared by the [place] FDA and has been authorized for detection and/or diagnosis of SARS-CoV-2 by FDA under an Emergency Use Authorization (EUA).  This EUA will remain in effect (meaning this test can be used) for the duration of the COVID-19 declaration under Section 564(b)(1) of the Act, 21 U.S.C. section 360bbb-3(b)(1), unless the authorization is terminated or revoked sooner. Performed at [facility], [address] [place], [place] [address]     Coagulation Studies: No results for input(s): LABPROT, INR in the last 72 hours.  Urinalysis:  Recent Labs  Lab 12/11/18 1407  COLORURINE STRAW*  LABSPEC 1.008  PHURINE 8.0  GLUCOSEU NEGATIVE  HGBUR NEGATIVE  BILIRUBINUR NEGATIVE  KETONESUR NEGATIVE  PROTEINUR NEGATIVE  NITRITE NEGATIVE  LEUKOCYTESUR NEGATIVE    Lipid Panel:    Component Value Date/Time   CHOL 177 12/12/2018 0451   TRIG 59 12/12/2018 0451   HDL 63 12/12/2018 0451   CHOLHDL 2.8 12/12/2018 0451   VLDL 12 12/12/2018 0451   LDLCALC 102 (H) 12/12/2018 0451    HgbA1C:  Lab Results  Component Value Date    HGBA1C 5.9 (H) 12/18/2016    Urine Drug Screen:      Component Value Date/Time   LABOPIA POSITIVE (A) 09/02/2018 0918   COCAINSCRNUR NONE DETECTED 09/02/2018 0918   LABBENZ POSITIVE (A) 09/02/2018 8099  AMPHETMU NONE DETECTED 09/02/2018 0918   THCU NONE DETECTED 09/02/2018 0918   LABBARB NONE DETECTED 09/02/2018 0918    Alcohol Level: No results for input(s): ETH in the last 168 hours.  Other results: EKG: atrial fibrillation, rate 85 bpm.  Imaging: Ct Head Wo Contrast  Result Date: 12/11/2018 CLINICAL DATA:  Speech difficulty developed earlier today. Elevated blood pressure. History of atrial fibrillation. Breast cancer. EXAM: CT HEAD WITHOUT CONTRAST TECHNIQUE: Contiguous axial images were obtained from the base of the skull through the vertex without intravenous contrast. COMPARISON:  CT head 09/02/2018. MR head 09/16/2018. FINDINGS: Brain: No evidence of acute infarction, hemorrhage, hydrocephalus, extra-axial collection or mass lesion/mass effect. Mild to moderate cerebral and cerebellar atrophy, unchanged from priors, not unexpected for age. Mild hypoattenuation of white matter, likely small vessel disease. Vascular: Calcification of the cavernous internal carotid arteries consistent with cerebrovascular atherosclerotic disease. No signs of intracranial large vessel occlusion. Skull: Intact Sinuses/Orbits: No acute findings. Other: No middle ear or mastoid fluid. IMPRESSION: Atrophy and small vessel disease similar to priors. No acute intracranial findings. Electronically Signed   By: Staci Righter M.D.   On: 12/11/2018 08:45   US Carotid Bilateral  Result Date: 12/12/2018 CLINICAL DATA:  Stroke EXAM: BILATERAL CAROTID DUPLEX ULTRASOUND TECHNIQUE: Pearline Cables scale imaging, color Doppler and duplex ultrasound were performed of bilateral carotid and vertebral arteries in the neck. COMPARISON:  None. FINDINGS: Criteria: Quantification of carotid stenosis is based on velocity parameters that  correlate the residual internal carotid diameter with NASCET-based stenosis levels, using the diameter of the distal internal carotid lumen as the denominator for stenosis measurement. The following velocity measurements were obtained: RIGHT ICA: 181 cm/sec CCA: 80 cm/sec SYSTOLIC ICA/CCA RATIO:  2.3 ECA: 166 cm/sec LEFT ICA: 190 cm/sec CCA: 91 cm/sec SYSTOLIC ICA/CCA RATIO:  2.1 ECA: 121 cm/sec RIGHT CAROTID ARTERY: Moderate mixed plaque in the bulb. Low resistance internal carotid Doppler pattern is preserved. RIGHT VERTEBRAL ARTERY:  Antegrade. LEFT CAROTID ARTERY: Moderate predominately calcified plaque in the bulb with shadowing of the lumen. Low resistance internal carotid Doppler pattern is preserved. LEFT VERTEBRAL ARTERY:  Antegrade. Additional findings: The rhythm is irregular suggesting either premature contractions or atrial fibrillation. IMPRESSION: 50-69% stenosis in the right and left internal carotid arteries. Electronically Signed   By: Marybelle Killings M.D.   On: 12/12/2018 07:53   Dg Chest Port 1 View  Result Date: 12/11/2018 CLINICAL DATA:  Fatigue and confusion. Recently treated for bronchitis. EXAM: PORTABLE CHEST 1 VIEW COMPARISON:  September 02, 2018 FINDINGS: Stable mild cardiomegaly. The hila and mediastinum are normal. No pneumothorax. No nodules or masses. No focal infiltrates or overt edema. IMPRESSION: No active disease. Electronically Signed   By: Dorise Bullion III M.D   On: 12/11/2018 09:09   Mr Brain/iac W Wo Contrast  Result Date: 12/11/2018 CLINICAL DATA:  Dizziness with vertigo. EXAM: MRI HEAD WITHOUT AND WITH CONTRAST TECHNIQUE: Multiplanar, multiecho pulse sequences of the brain and surrounding structures were obtained without and with intravenous contrast. CONTRAST:  Gadavist 6 mL. COMPARISON:  CT head earlier today.  MR head, 12/18/2016. FINDINGS: Brain: 5 mm focus of restricted diffusion, LEFT central sulcus near the vertex, corresponding low ADC, consistent with acute  infarct. No other similar areas of restricted diffusion. No hemorrhage, mass lesion, hydrocephalus, or extra-axial fluid. Generalized atrophy. Mild subcortical and periventricular T2 and FLAIR hyperintensities, likely chronic microvascular ischemic change. Thin-section imaging through the posterior fossa reveals no evidence of vestibular schwannoma or posterior fossa mass. Post  infusion, no abnormal enhancement of the brain or meninges. Vascular: Normal flow voids. Skull and upper cervical spine: Normal marrow signal. Sinuses/Orbits: Negative. Other: None. 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 No hemorrhage. Atrophy and small vessel disease. No abnormal postcontrast enhancement. Electronically Signed   By: Staci Righter M.D.   On: 12/11/2018 12:22    Assessment: 85 y.o. female with a history of afib on ASA who presents with altered mental status. MRI of the brain reviewed and shows a small, acute left parietal infarct.  Etiology likely embolic.  Patient on ASA prior to admission.  Initially BP was elevated.  Currently BP controlled.  Carotid dopplers and echocardiogram pending.    - Pt was on Eliquis prior and stopped it - Agree continuation of Eliquis - d/c planning for neuro stand point 12/12/2018, 11:04 AM

## 2018-12-12 NOTE — Progress Notes (Signed)
*  PRELIMINARY RESULTS* Echocardiogram 2D Echocardiogram has been performed.  Janice Perkins 12/12/2018, 9:50 AM

## 2018-12-12 NOTE — Evaluation (Signed)
Speech Language Pathology Evaluation Patient Details Name: CHERYL CHAY MRN: [id number] DOB: [birth year] Today's Date: 12/12/2018 Time: 6734-1937 SLP Time Calculation (min) (ACUTE ONLY): 49 min  Problem List:  Patient Active Problem List   Diagnosis Date Noted  . Hypertensive encephalopathy 12/11/2018  . Hypoxia 05/30/2018  . Leukocytosis 04/05/2018  . Pneumonia 11/15/2017  . Pleural effusion 05/16/2017  . AF (paroxysmal atrial fibrillation) ([place]) 05/16/2017  . Breast cancer ([place]) 05/16/2017  . Dependence on nocturnal oxygen therapy 05/16/2017  . HTN (hypertension) 05/16/2017  . Generalized weakness 03/07/2017  . Hyponatremia 03/07/2017  . Dehydration 03/07/2017  . UTI (urinary tract infection) 03/07/2017  . HCAP (healthcare-associated pneumonia) 01/19/2017  . AKI (acute kidney injury) ([place]) 12/18/2016  . Primary cancer of upper outer quadrant of right female breast ([place]) 04/07/2016  . Lumbar radiculopathy 03/23/2016  . Spinal stenosis, lumbar region, with neurogenic claudication 03/23/2016  . DDD (degenerative disc disease), lumbar 01/14/2015  . Lumbosacral facet joint syndrome 01/14/2015  . Sacroiliac joint dysfunction 01/14/2015  . Greater trochanteric bursitis 01/14/2015  . Bilateral occipital neuralgia 01/14/2015   Past Medical History:  Past Medical History:  Diagnosis Date  . Asthma   . Atrial fibrillation ([place])   . Breast cancer ([place]) 2011  . Bronchitis 04/2015  . Cancer [facility]) 2011   breast  . COPD (chronic obstructive pulmonary disease) ([place])   . Hypertension   . Shingles    10/2015   Past Surgical History:  Past Surgical History:  Procedure Laterality Date  . BREAST EXCISIONAL BIOPSY Right 11/29/13   two areas FAT NECROSIS  . BREAST EXCISIONAL BIOPSY Right 10/30/2009   lumpectomy - radiation  . COLONOSCOPY WITH PROPOFOL N/A 06/10/2018   Procedure: COLONOSCOPY WITH PROPOFOL;  Surgeon: Manya Silvas, MD;  [facility];  Service: Endoscopy;   Laterality: N/A;  . ESOPHAGOGASTRODUODENOSCOPY (EGD) WITH PROPOFOL N/A 06/10/2018   Procedure: ESOPHAGOGASTRODUODENOSCOPY (EGD) WITH PROPOFOL;  Surgeon: Manya Silvas, MD;  Location: [facility] ENDOSCOPY;  Service: Endoscopy;  Laterality: N/A;  . NASAL SINUS SURGERY     HPI:  H&P 12/11/2018: Traeh Milroy  is a 85 y.o. female coming in with difficulty thinking.  She could not get her thoughts straight.  She woke up dizzy.  She has had episodes of vertigo in the past.  She also states her breathing was not good and she is nauseous and she took some Zofran.  She states that she is moving all of her extremities well.  She just thinks that she cannot think very well.  She was able to get all her words out and communicate with me but slow with her answers.  Hospitalist services were contacted for further evaluation.  In the ER her blood pressure was very high.  She states that her blood pressure has been high in the past.  Patient recently got some doxycycline.   Assessment / Plan / Recommendation Clinical Impression  The patient is presenting with functional language skills and states that she is at her baseline.  The patient is observed to have rambling, tangential speech that improves with a specific stimulus such as a complex picture.  The patient scored a screening aphasia quotient of 92/100.  The patient reports that she had just completed home health speech therapy (for cognitive therapy per her description). The patient does not require speech therapy at this time.  She is advised to seek speech therapy referral if she is having difficulty with communication or cognition.    SLP Assessment  SLP  Recommendation/Assessment: All further Speech Lanaguage Pathology  needs can be addressed in the next venue of care SLP Visit Diagnosis: Cognitive communication deficit (R41.841)    Follow Up Recommendations  None    Frequency and Duration           SLP Evaluation   [place] Aphasia Battery- Screening   Spontaneous Speech      Information content   9/10       Fluency    10/10     Comprehension     Yes/No questions   10/10          Sequential Commands  9/10     Repetition    7/10     Naming    Object Naming   10/10        Screening Aphasia Quotient 92/100    Cognition  Overall Cognitive Status: Within Functional Limits for tasks assessed Orientation Level: Oriented X4       Comprehension  Auditory Comprehension Overall Auditory Comprehension: Appears within functional limits for tasks assessed    Expression Verbal Expression Overall Verbal Expression: Appears within functional limits for tasks assessed   Oral / Motor  Oral Motor/Sensory Function Overall Oral Motor/Sensory Function: Within functional limits Motor Speech Overall Motor Speech: Appears within functional limits for tasks assessed   GO                   Leroy Sea, MS/CCC- SLP  Valetta Fuller, Susie 12/12/2018, 3:28 PM

## 2018-12-12 NOTE — Progress Notes (Addendum)
ANTICOAGULATION CONSULT NOTE - Initial Consult  Pharmacy Consult for apixaban Indication: atrial fibrillation  Allergies  Allergen Reactions  . Diphenhydramine Hcl Rash  . Penicillins Hives    .Has patient had a PCN reaction causing immediate rash, facial/tongue/throat swelling, SOB or lightheadedness with hypotension: Unknown Has patient had a PCN reaction causing severe rash involving mucus membranes or skin necrosis: Unknown Has patient had a PCN reaction that required hospitalization: Unknown Has patient had a PCN reaction occurring within the last 10 years: Unknown If all of the above answers are "NO", then may proceed with Cephalosporin use.   . Captopril Other (See Comments)  . Enalapril Maleate Other (See Comments)    Other reaction(s): Unknown  . Tape Itching  . Terfenadine Other (See Comments)  . Augmentin [Amoxicillin-Pot Clavulanate] Rash    Has patient had a PCN reaction causing immediate rash, facial/tongue/throat swelling, SOB or lightheadedness with hypotension: Unknown Has patient had a PCN reaction causing severe rash involving mucus membranes or skin necrosis: Unknown Has patient had a PCN reaction that required hospitalization: Unknown Has patient had a PCN reaction occurring within the last 10 years: Unknown If all of the above answers are "NO", then may proceed with Cephalosporin use.  . Biaxin [Clarithromycin] Rash, Other (See Comments) and Hives    Other reaction(s): Unknown    Patient Measurements: Height: 5' (152.4 cm) Weight: 153 lb (69.4 kg) IBW/kg (Calculated) : 45.5   Vital Signs: Temp: 98.4 F (36.9 C) (04/27 0844) Temp Source: Oral (04/27 0844) BP: 150/77 (04/27 0844) Pulse Rate: 62 (04/27 0844)  Labs: Recent Labs    12/11/18 0752 12/12/18 0451  HGB 15.0 13.5  HCT 43.7 40.1  PLT 192 177  CREATININE 0.92 1.03*  TROPONINI <0.03  --     Estimated Creatinine Clearance: 35.4 mL/min (A) (by C-G formula based on SCr of 1.03 mg/dL  (H)).    Assessment: 85 yo female with hx of AFib to start on apixaban. No doses of enoxaparin have been given yet.  Wt 69.4 SCr 1.03 Hgb 13.5, plt 177   Goal of Therapy:  Monitor platelets by anticoagulation protocol: Yes   Plan:  Will start apixaban 5 mg PO BID starting tonight per consult Will need SCr and CBC at least every three days per policy  Rayna Sexton L 12/12/2018,2:08 PM

## 2018-12-12 NOTE — Progress Notes (Signed)
Patient ID: Janice Perkins, female   DOB: Aug 16, 1933, 85 y.o.   MRN: [id number]  [facility] PROGRESS NOTE  Janice Perkins [id number] DOB: 03-Mar-1933 DOA: 12/11/2018 PCP: [name], MD  HPI/Subjective: Patient feeling okay.  Still having some dizziness.  Still having some difficulty thinking but a little bit better than yesterday.  Objective: Vitals:   12/12/18 0541 12/12/18 0844  BP: (!) 151/72 (!) 150/77  Pulse: 61 62  Resp: 19 17  Temp: 98.3 F (36.8 C) 98.4 F (36.9 C)  SpO2: 100% 99%    Intake/Output Summary (Last 24 hours) at 12/12/2018 1354 Last data filed at 12/12/2018 1342 Gross per 24 hour  Intake 804.17 ml  Output 400 ml  Net 404.17 ml   Filed Weights   12/11/18 1240  Weight: 69.4 kg    ROS: Review of Systems  Constitutional: Negative for chills and fever.  Eyes: Negative for blurred vision.  Respiratory: Negative for cough and shortness of breath.   Cardiovascular: Negative for chest pain.  Gastrointestinal: Negative for abdominal pain, constipation, diarrhea, nausea and vomiting.  Genitourinary: Negative for dysuria.  Musculoskeletal: Negative for joint pain.  Neurological: Positive for dizziness. Negative for headaches.   Exam: Physical Exam  HENT:  Nose: No mucosal edema.  Mouth/Throat: No oropharyngeal exudate or posterior oropharyngeal edema.  Eyes: Pupils are equal, round, and reactive to light. Conjunctivae, EOM and lids are normal.  Neck: No JVD present. Carotid bruit is not present. No edema present. No thyroid mass and no thyromegaly present.  Cardiovascular: S1 normal and S2 normal. Exam reveals no gallop.  No murmur heard. Pulses:      Dorsalis pedis pulses are 2+ on the right side and 2+ on the left side.  Respiratory: No respiratory distress. She has no wheezes. She has no rhonchi. She has no rales.  GI: Soft. Bowel sounds are normal. There is no abdominal tenderness.  Musculoskeletal:     Right ankle: She exhibits  swelling.     Left ankle: She exhibits swelling.  Lymphadenopathy:    She has no cervical adenopathy.  Neurological: She is alert. No cranial nerve deficit.  Skin: Skin is warm. No rash noted. Nails show no clubbing.  Psychiatric: She has a normal mood and affect.      Data Reviewed: Basic Metabolic Panel: Recent Labs  Lab 12/11/18 0752 12/12/18 0451  NA 136 133*  K 3.9 4.4  CL 100 99  CO2 28 28  GLUCOSE 110* 106*  BUN 20 18  CREATININE 0.92 1.03*  CALCIUM 9.4 8.9   Liver Function Tests: Recent Labs  Lab 12/11/18 0752  AST 27  ALT 22  ALKPHOS 57  BILITOT 0.8  PROT 7.3  ALBUMIN 3.8   CBC: Recent Labs  Lab 12/11/18 0752 12/12/18 0451  WBC 6.1 7.2  NEUTROABS 3.3  --   HGB 15.0 13.5  HCT 43.7 40.1  MCV 92.2 93.5  PLT 192 177   Cardiac Enzymes: Recent Labs  Lab 12/11/18 0752  TROPONINI <0.03   BNP (last 3 results) Recent Labs    07/04/18 1145  BNP 170.0*     Recent Results (from the past 240 hour(s))  SARS Coronavirus 2 [facility] order, Performed in [place] hospital lab)     Status: None   Collection Time: 12/11/18  7:52 AM  Result Value Ref Range Status   SARS Coronavirus 2 NEGATIVE NEGATIVE Final    Comment: (NOTE) If result is NEGATIVE SARS-CoV-2 target nucleic acids are NOT DETECTED.  The SARS-CoV-2 RNA is generally detectable in upper and lower  respiratory specimens during the acute phase of infection. The lowest  concentration of SARS-CoV-2 viral copies this assay can detect is 250  copies / mL. A negative result does not preclude SARS-CoV-2 infection  and should not be used as the sole basis for treatment or other  patient management decisions.  A negative result may occur with  improper specimen collection / handling, submission of specimen other  than nasopharyngeal swab, presence of viral mutation(s) within the  areas targeted by this assay, and inadequate number of viral copies  (<250 copies / mL). A negative result must be  combined with clinical  observations, patient history, and epidemiological information. If result is POSITIVE SARS-CoV-2 target nucleic acids are DETECTED. The SARS-CoV-2 RNA is generally detectable in upper and lower  respiratory specimens dur ing the acute phase of infection.  Positive  results are indicative of active infection with SARS-CoV-2.  Clinical  correlation with patient history and other diagnostic information is  necessary to determine patient infection status.  Positive results do  not rule out bacterial infection or co-infection with other viruses. If result is PRESUMPTIVE POSTIVE SARS-CoV-2 nucleic acids MAY BE PRESENT.   A presumptive positive result was obtained on the submitted specimen  and confirmed on repeat testing.  While 2019 novel coronavirus  (SARS-CoV-2) nucleic acids may be present in the submitted sample  additional confirmatory testing may be necessary for epidemiological  and / or clinical management purposes  to differentiate between  SARS-CoV-2 and other Sarbecovirus currently known to infect humans.  If clinically indicated additional testing with an alternate test  methodology [phone]) is advised. The SARS-CoV-2 RNA is generally  detectable in upper and lower respiratory sp ecimens during the acute  phase of infection. The expected result is Negative. Fact Sheet for Patients:  StrictlyIdeas.no Fact Sheet for Healthcare Providers: BankingDealers.co.za This test is not yet approved or cleared by the [place] FDA and has been authorized for detection and/or diagnosis of SARS-CoV-2 by FDA under an Emergency Use Authorization (EUA).  This EUA will remain in effect (meaning this test can be used) for the duration of the COVID-19 declaration under Section 564(b)(1) of the Act, 21 U.S.C. section 360bbb-3(b)(1), unless the authorization is terminated or revoked sooner. Performed at [facility], [address] [place], [place] [address]      Studies: Ct Head Wo Contrast  Result Date: 12/11/2018 CLINICAL DATA:  Speech difficulty developed earlier today. Elevated blood pressure. History of atrial fibrillation. Breast cancer. EXAM: CT HEAD WITHOUT CONTRAST TECHNIQUE: Contiguous axial images were obtained from the base of the skull through the vertex without intravenous contrast. COMPARISON:  CT head 09/02/2018. MR head 09/16/2018. FINDINGS: Brain: No evidence of acute infarction, hemorrhage, hydrocephalus, extra-axial collection or mass lesion/mass effect. Mild to moderate cerebral and cerebellar atrophy, unchanged from priors, not unexpected for age. Mild hypoattenuation of white matter, likely small vessel disease. Vascular: Calcification of the cavernous internal carotid arteries consistent with cerebrovascular atherosclerotic disease. No signs of intracranial large vessel occlusion. Skull: Intact Sinuses/Orbits: No acute findings. Other: No middle ear or mastoid fluid. IMPRESSION: Atrophy and small vessel disease similar to priors. No acute intracranial findings. Electronically Signed   By: Staci Righter M.D.   On: 12/11/2018 08:45   Ct Angio Neck W Or Wo Contrast  Result Date: 12/12/2018 CLINICAL DATA:  Acute subcentimeter left frontoparietal cortical infarction seen yesterday by MRI. Dizziness and vertigo. EXAM: CT ANGIOGRAPHY  NECK TECHNIQUE: Multidetector CT imaging of the neck was performed using the standard protocol during bolus administration of intravenous contrast. Multiplanar CT image reconstructions and MIPs were obtained to evaluate the vascular anatomy. Carotid stenosis measurements (when applicable) are obtained utilizing NASCET criteria, using the distal internal carotid diameter as the denominator. CONTRAST:  39mL OMNIPAQUE IOHEXOL 350 MG/ML SOLN COMPARISON:  MRI 12/11/2018.  CT 12/11/2018 FINDINGS: Aortic arch: Aortic atherosclerosis.  No aneurysm or dissection. Right  carotid system: Common carotid artery widely patent to the bifurcation region. Extensive calcified and soft plaque at the carotid bifurcation and ICA bulb. Minimal diameter is 2.3 mm. Compared to a more distal cervical ICA diameter of 4.7 mm, this indicates a 50% stenosis. There is severe stenosis of the proximal external carotid artery. Left carotid system: Common carotid artery shows atherosclerotic disease at its origin with mild irregularity and stenosis of about 10%. There is calcified plaque at the carotid bifurcation and proximal ICA bulb. Minimal diameter is 4.8 mm, representing a 10% stenosis when compared to the more distal cervical ICA diameter 5.2 mm. There is severe stenosis of the proximal external carotid. Cervical ICA is tortuous. Vertebral arteries: Both vertebral artery origins are widely patent. Both vertebral arteries appear widely patent through the cervical region to the foramen magnum. Skeleton: Ordinary cervical spondylosis. Cervical facet arthropathy. Other neck: No mass or lymphadenopathy. Upper chest: No acute chest finding. The scan does go through the circle-of-Willis. There is atherosclerotic change in both carotid siphon regions but no flow limiting stenosis. The anterior and middle cerebral vessels are patent without proximal stenosis, aneurysm or vascular malformation. Both vertebral arteries are patent through the foramen magnum to the basilar. No flow limiting stenosis. No basilar stenosis. Posterior circulation branch vessels are patent. IMPRESSION: Right carotid bifurcation: Calcified and soft plaque. 50% stenosis of the proximal ICA. Severe stenosis of the proximal ECA. Left carotid bifurcation: Calcified plaque. 10% stenosis when compared to the more distal cervical ICA. No flow limiting stenosis. The patient does have atherosclerotic disease at the left common carotid origin from the arch with some irregularity but no stenosis greater than 10%. No significant posterior  circulation finding. Electronically Signed   By: Nelson Chimes M.D.   On: 12/12/2018 13:42   US Carotid Bilateral  Result Date: 12/12/2018 CLINICAL DATA:  Stroke EXAM: BILATERAL CAROTID DUPLEX ULTRASOUND TECHNIQUE: Pearline Cables scale imaging, color Doppler and duplex ultrasound were performed of bilateral carotid and vertebral arteries in the neck. COMPARISON:  None. FINDINGS: Criteria: Quantification of carotid stenosis is based on velocity parameters that correlate the residual internal carotid diameter with NASCET-based stenosis levels, using the diameter of the distal internal carotid lumen as the denominator for stenosis measurement. The following velocity measurements were obtained: RIGHT ICA: 181 cm/sec CCA: 80 cm/sec SYSTOLIC ICA/CCA RATIO:  2.3 ECA: 166 cm/sec LEFT ICA: 190 cm/sec CCA: 91 cm/sec SYSTOLIC ICA/CCA RATIO:  2.1 ECA: 121 cm/sec RIGHT CAROTID ARTERY: Moderate mixed plaque in the bulb. Low resistance internal carotid Doppler pattern is preserved. RIGHT VERTEBRAL ARTERY:  Antegrade. LEFT CAROTID ARTERY: Moderate predominately calcified plaque in the bulb with shadowing of the lumen. Low resistance internal carotid Doppler pattern is preserved. LEFT VERTEBRAL ARTERY:  Antegrade. Additional findings: The rhythm is irregular suggesting either premature contractions or atrial fibrillation. IMPRESSION: 50-69% stenosis in the right and left internal carotid arteries. Electronically Signed   By: Marybelle Killings M.D.   On: 12/12/2018 07:53   Dg Chest Port 1 View  Result Date: 12/11/2018 CLINICAL DATA:  Fatigue and confusion. Recently treated for bronchitis. EXAM: PORTABLE CHEST 1 VIEW COMPARISON:  September 02, 2018 FINDINGS: Stable mild cardiomegaly. The hila and mediastinum are normal. No pneumothorax. No nodules or masses. No focal infiltrates or overt edema. IMPRESSION: No active disease. Electronically Signed   By: Dorise Bullion III M.D   On: 12/11/2018 09:09   Mr Brain/iac W Wo Contrast  Result Date:  12/11/2018 CLINICAL DATA:  Dizziness with vertigo. EXAM: MRI HEAD WITHOUT AND WITH CONTRAST TECHNIQUE: Multiplanar, multiecho pulse sequences of the brain and surrounding structures were obtained without and with intravenous contrast. CONTRAST:  Gadavist 6 mL. COMPARISON:  CT head earlier today.  MR head, 12/18/2016. FINDINGS: Brain: 5 mm focus of restricted diffusion, LEFT central sulcus near the vertex, corresponding low ADC, consistent with acute infarct. No other similar areas of restricted diffusion. No hemorrhage, mass lesion, hydrocephalus, or extra-axial fluid. Generalized atrophy. Mild subcortical and periventricular T2 and FLAIR hyperintensities, likely chronic microvascular ischemic change. Thin-section imaging through the posterior fossa reveals no evidence of vestibular schwannoma or posterior fossa mass. Post infusion, no abnormal enhancement of the brain or meninges. Vascular: Normal flow voids. Skull and upper cervical spine: Normal marrow signal. Sinuses/Orbits: Negative. Other: None. 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 No hemorrhage. Atrophy and small vessel disease. No abnormal postcontrast enhancement. Electronically Signed   By: Staci Righter M.D.   On: 12/11/2018 12:22    Scheduled Meds: . atorvastatin  40 mg Oral q1800  . busPIRone  10 mg Oral BID  . cholecalciferol  1,000 Units Oral Daily  . clopidogrel  75 mg Oral Daily  . diltiazem  120 mg Oral Daily  . enoxaparin (LOVENOX) injection  40 mg Subcutaneous Q24H  . ferrous sulfate  325 mg Oral Q breakfast  . hydrALAZINE  25 mg Oral TID  . levothyroxine  50 mcg Oral Q0600  . loratadine  10 mg Oral Daily  . losartan  100 mg Oral QHS  . magnesium oxide  400 mg Oral BID  . mometasone-formoterol  2 puff Inhalation BID  . pantoprazole  40 mg Oral Daily   Continuous Infusions:  Assessment/Plan:  1. Acute stroke in the left frontal anterior parietal area with dizziness and difficulty  thinking.  Patient seen by neurology.  Okay to restart Eliquis.  Patient willing to try Eliquis.  States she has had problems with nosebleeds in the past.  Will stop aspirin and Plavix at this point.  Echocardiogram still pending.  CT angios did show some blockage in the right carotid artery. 2. Dizziness and vertigo.  Physical therapy evaluation.  Likely will need balance therapy as outpatient. 3. Hypertensive encephalopathy on presentation.  Blood pressure did go low.  With the acute stroke I did cut back on meds today.  Continue diltiazem, losartan and hydralazine. 4. Atrial fibrillation chronic.  Trial of Eliquis.  On Cardizem for rate control. 5. History of breast cancer 6. Anxiety on Xanax and BuSpar 7. Hyperlipidemia unspecified increased dose of atorvastatin with stroke.  LDL 102.  Goal less than 70. 8. Hypothyroidism unspecified.  TSH normal range.  Continue same dose of levothyroxine  Code Status:     Code Status Orders  (From admission, onward)         Start     Ordered   12/11/18 1041  Full code  Continuous     12/11/18 1041        Code Status History    Date Active Date Inactive  Code Status Order ID Comments User Context   09/02/2018 1156 09/08/2018 1621 Full Code [id number]  Nicholes Mango, MD Inpatient   05/30/2018 1127 05/31/2018 1740 Full Code [id number]  Bettey Costa, MD Inpatient   11/15/2017 1829 11/21/2017 1821 Full Code [id number]  Vaughan Basta, MD Inpatient   05/16/2017 0041 05/17/2017 1824 Full Code [id number]  Quintella Baton, MD ED   03/07/2017 1446 03/09/2017 1509 Full Code [id number]  Idelle Crouch, MD Inpatient   01/19/2017 0837 01/21/2017 1415 Full Code [id number]  Harrie Foreman, MD Inpatient   12/18/2016 0718 12/22/2016 1947 Full Code [id number]  Harrie Foreman, MD Inpatient     Family Communication: Spoke with husband on the phone Disposition Plan: Evaluate for disposition tomorrow  Consultants:  Neurology  Time spent: 28 minutes  [place]

## 2018-12-13 LAB — RPR: RPR Ser Ql: NONREACTIVE

## 2018-12-13 MED ORDER — LOSARTAN POTASSIUM 50 MG PO TABS: 100.0000 mg | ORAL_TABLET | Freq: Every day | ORAL | Status: DC

## 2018-12-13 MED ORDER — HYDRALAZINE HCL 10 MG PO TABS: 10.0000 mg | ORAL_TABLET | Freq: Three times a day (TID) | ORAL | 0 refills | Status: DC

## 2018-12-13 MED ORDER — APIXABAN 5 MG PO TABS: 5.0000 mg | ORAL_TABLET | Freq: Two times a day (BID) | ORAL | 0 refills | Status: DC

## 2018-12-13 MED ORDER — HYDRALAZINE HCL 10 MG PO TABS
10.0000 mg | ORAL_TABLET | Freq: Three times a day (TID) | ORAL | Status: DC
  Administered 2018-12-13: 08:00:00 10 mg via ORAL
  Filled 2018-12-13 (×3): qty 1

## 2018-12-13 NOTE — Progress Notes (Signed)
Pt for discharge home. Sl d/cd  Tele d/cd. A/o. No resp distress no c/o dizziness at present. Discharge instructions discussed with pt. meds diet activity and f/u discussed.  Verbalizes understanding.pt spoke with her husband waiting for him to pick her up.

## 2018-12-13 NOTE — Plan of Care (Signed)
  Problem: Education: Goal: Knowledge of disease or condition will improve Outcome: Progressing Goal: Knowledge of secondary prevention will improve Outcome: Progressing Goal: Knowledge of patient specific risk factors addressed and post discharge goals established will improve Outcome: Progressing   Problem: Coping: Goal: Will verbalize positive feelings about self Outcome: Progressing Goal: Will identify appropriate support needs Outcome: Progressing   Problem: Health Behavior/Discharge Planning: Goal: Ability to manage health-related needs will improve Outcome: Progressing   Problem: Self-Care: Goal: Ability to participate in self-care as condition permits will improve Outcome: Progressing Goal: Verbalization of feelings and concerns over difficulty with self-care will improve Outcome: Progressing Goal: Ability to communicate needs accurately will improve Outcome: Progressing   Problem: Ischemic Stroke/TIA Tissue Perfusion: Goal: Complications of ischemic stroke/TIA will be minimized Outcome: Progressing   Problem: Education: Goal: Knowledge of General Education information will improve Description Including pain rating scale, medication(s)/side effects and non-pharmacologic comfort measures Outcome: Progressing   Problem: Health Behavior/Discharge Planning: Goal: Ability to manage health-related needs will improve Outcome: Progressing   Problem: Clinical Measurements: Goal: Ability to maintain clinical measurements within normal limits will improve Outcome: Progressing Goal: Will remain free from infection Outcome: Progressing Goal: Diagnostic test results will improve Outcome: Progressing Goal: Respiratory complications will improve Outcome: Progressing Goal: Cardiovascular complication will be avoided Outcome: Progressing   Problem: Activity: Goal: Risk for activity intolerance will decrease Outcome: Progressing   Problem: Nutrition: Goal: Adequate  nutrition will be maintained Outcome: Progressing   Problem: Pain Managment: Goal: General experience of comfort will improve Outcome: Progressing   Problem: Safety: Goal: Ability to remain free from injury will improve Outcome: Progressing

## 2018-12-13 NOTE — TOC Transition Note (Signed)
Transition of Care [facility]) - CM/SW Discharge Note   Patient Details  Name: Janice Perkins MRN: [id number] Date of Birth: 1932-10-13  Transition of Care [facility] CM/SW Contact:  Su Hilt, RN Phone Number: 12/13/2018, 9:26 AM   Clinical Narrative:     Patient lives with  Adult children and spouse She has DME at home including Walker, rollator, BSC, grab bars She was given the 30 day free trial card for [facility] PCP is Dr. Ashby Dawes is [employer] provided by family No problem affording medication No needs at home  Final next level of care: Home/Self Care Barriers to Discharge: Barriers Resolved   Patient Goals and CMS Choice        Discharge Placement                       Discharge Plan and Services   Discharge Planning Services: CM Consult                                 Social Determinants of Health (SDOH) Interventions     Readmission Risk Interventions No flowsheet data found.

## 2018-12-13 NOTE — Discharge Summary (Signed)
[facility] NAME: Janice Perkins    MR#:  [id number]  DATE OF BIRTH:  1932-10-29  DATE OF ADMISSION:  12/11/2018 ADMITTING PHYSICIAN: Loletha Grayer, MD  DATE OF DISCHARGE: 12/13/2018 11:37 AM  PRIMARY CARE PHYSICIAN: [name], MD    ADMISSION DIAGNOSIS:  Bronchitis [J40] Altered mental status, unspecified altered mental status type [R41.82]  DISCHARGE DIAGNOSIS:  Active Problems:   Hypertensive encephalopathy   SECONDARY DIAGNOSIS:   Past Medical History:  Diagnosis Date  . Asthma   . Atrial fibrillation ([place])   . Breast cancer ([place]) 2011  . Bronchitis 04/2015  . Cancer [facility]) 2011   breast  . COPD (chronic obstructive pulmonary disease) ([place])   . Hypertension   . Shingles    10/2015    HOSPITAL COURSE:   1.  Acute stroke in the left frontal anterior parietal area with dizziness and difficulty thinking.  Patient was seen by neurology.  The patient was cleared to go on Eliquis because this is a small stroke.  The patient is willing to retry Eliquis.  She states that she has had problems with nosebleeds in the past.  She has a clip for her nose and Afrin nasal spray.  I told her she must apply pressure and ice and head forward along with the clip for her nose if this occurs.  Echocardiogram still not read yet.  CT angios of the neck showed 50% blockage of the right carotid artery.  This will have to be followed up over time. 2.  Dizziness and vertigo.  Physical therapy recommended home health which was set up.  I did give her a prescription to go [facility] if her vertigo continues after the home health is completed. 3.  Hypertensive encephalopathy on presentation.  Blood pressure did go low with restarting medications.  With the acute stroke I had a cut back on medications.  Hydralazine cut back to 10 mg 3 times daily.  Same dose of diltiazem and losartan.  Other medications that were  prescribed were stopped. 4.  Chronic atrial fibrillation.  Trial of Eliquis.  On Cardizem for rate control. 5.  History of breast cancer 6.  Anxiety on Xanax and BuSpar 7.  Hyperlipidemia unspecified.  DISCHARGE CONDITIONS:   Satisfactory  CONSULTS OBTAINED:  Treatment Team:  Alexis Goodell, MD Catarina Hartshorn, MD  DRUG ALLERGIES:   Allergies  Allergen Reactions  . Diphenhydramine Hcl Rash  . Penicillins Hives    .Has patient had a PCN reaction causing immediate rash, facial/tongue/throat swelling, SOB or lightheadedness with hypotension: Unknown Has patient had a PCN reaction causing severe rash involving mucus membranes or skin necrosis: Unknown Has patient had a PCN reaction that required hospitalization: Unknown Has patient had a PCN reaction occurring within the last 10 years: Unknown If all of the above answers are "NO", then may proceed with Cephalosporin use.   . Captopril Other (See Comments)  . Enalapril Maleate Other (See Comments)    Other reaction(s): Unknown  . Tape Itching  . Terfenadine Other (See Comments)  . Augmentin [Amoxicillin-Pot Clavulanate] Rash    Has patient had a PCN reaction causing immediate rash, facial/tongue/throat swelling, SOB or lightheadedness with hypotension: Unknown Has patient had a PCN reaction causing severe rash involving mucus membranes or skin necrosis: Unknown Has patient had a PCN reaction that required hospitalization: Unknown Has patient had a PCN reaction occurring within the last 10 years: Unknown If  all of the above answers are "NO", then may proceed with Cephalosporin use.  . Biaxin [Clarithromycin] Rash, Other (See Comments) and Hives    Other reaction(s): Unknown    DISCHARGE MEDICATIONS:   Allergies as of 12/13/2018      Reactions   Diphenhydramine Hcl Rash   Penicillins Hives   .Has patient had a PCN reaction causing immediate rash, facial/tongue/throat swelling, SOB or lightheadedness with hypotension:  Unknown Has patient had a PCN reaction causing severe rash involving mucus membranes or skin necrosis: Unknown Has patient had a PCN reaction that required hospitalization: Unknown Has patient had a PCN reaction occurring within the last 10 years: Unknown If all of the above answers are "NO", then may proceed with Cephalosporin use.   Captopril Other (See Comments)   Enalapril Maleate Other (See Comments)   Other reaction(s): Unknown   Tape Itching   Terfenadine Other (See Comments)   Augmentin [amoxicillin-pot Clavulanate] Rash   Has patient had a PCN reaction causing immediate rash, facial/tongue/throat swelling, SOB or lightheadedness with hypotension: Unknown Has patient had a PCN reaction causing severe rash involving mucus membranes or skin necrosis: Unknown Has patient had a PCN reaction that required hospitalization: Unknown Has patient had a PCN reaction occurring within the last 10 years: Unknown If all of the above answers are "NO", then may proceed with Cephalosporin use.   Biaxin [clarithromycin] Rash, Other (See Comments), Hives   Other reaction(s): Unknown      Medication List    STOP taking these medications   amLODipine 5 MG tablet Commonly known as:  NORVASC   cloNIDine 0.1 MG tablet Commonly known as:  CATAPRES   doxycycline 100 MG capsule Commonly known as:  VIBRAMYCIN   methocarbamol 500 MG tablet Commonly known as:  ROBAXIN     TAKE these medications   albuterol 108 (90 Base) MCG/ACT inhaler Commonly known as:  VENTOLIN HFA Inhale 2 puffs into the lungs every 6 (six) hours as needed for wheezing or shortness of breath.   albuterol 1.25 MG/3ML nebulizer solution Commonly known as:  ACCUNEB Inhale 3 mLs into the lungs every 6 (six) hours as needed for wheezing.   ALPRAZolam 0.5 MG tablet Commonly known as:  XANAX Take 0.5 mg by mouth daily as needed for anxiety or sleep.   apixaban 5 MG Tabs tablet Commonly known as:  ELIQUIS Take 1 tablet (5 mg  total) by mouth 2 (two) times daily.   atorvastatin 20 MG tablet Commonly known as:  LIPITOR Take 20 mg by mouth at bedtime.   busPIRone 10 MG tablet Commonly known as:  BUSPAR Take 10 mg by mouth 2 (two) times daily.   cholecalciferol 1000 units tablet Commonly known as:  VITAMIN D Take 1,000 Units by mouth daily.   diltiazem 120 MG 24 hr capsule Commonly known as:  DILACOR XR Take 120 mg by mouth daily.   esomeprazole 40 MG capsule Commonly known as:  NEXIUM Take 40 mg by mouth 2 (two) times daily before a meal.   ferrous sulfate 325 (65 FE) MG tablet Take 325 mg by mouth daily with breakfast.   Fluticasone-Salmeterol 250-50 MCG/DOSE Aepb Commonly known as:  ADVAIR Inhale 1 puff into the lungs 2 (two) times daily.   hydrALAZINE 10 MG tablet Commonly known as:  APRESOLINE Take 1 tablet (10 mg total) by mouth 3 (three) times daily. What changed:    medication strength  how much to take   HYDROcodone-acetaminophen 5-325 MG tablet Commonly known as:  NORCO/VICODIN Take 1 tablet by mouth 2 (two) times daily as needed for moderate pain or severe pain.   levothyroxine 50 MCG tablet Commonly known as:  SYNTHROID Take 50 mcg by mouth daily before breakfast.   loratadine 10 MG tablet Commonly known as:  CLARITIN Take 10 mg by mouth daily.   losartan 50 MG tablet Commonly known as:  COZAAR Take 2 tablets (100 mg total) by mouth at bedtime. What changed:  when to take this   magnesium oxide 400 MG tablet Commonly known as:  MAG-OX Take 400 mg by mouth 2 (two) times daily.   meclizine 25 MG tablet Commonly known as:  ANTIVERT Take 1 tablet (25 mg total) by mouth 2 (two) times daily as needed for dizziness.   mometasone-formoterol 100-5 MCG/ACT Aero Commonly known as:  DULERA Inhale 2 puffs into the lungs 2 (two) times daily.      I called pharmacy and the patient.  I increased her Lipitor, (atorvastatin) to 40 mg daily  DISCHARGE INSTRUCTIONS:   Follow-up  PMD 5 days  If you experience worsening of your admission symptoms, develop shortness of breath, life threatening emergency, suicidal or homicidal thoughts you must seek medical attention immediately by calling 911 or calling your MD immediately  if symptoms less severe.  You Must read complete instructions/literature along with all the possible adverse reactions/side effects for all the Medicines you take and that have been prescribed to you. Take any new Medicines after you have completely understood and accept all the possible adverse reactions/side effects.   Please note  You were cared for by a hospitalist during your hospital stay. If you have any questions about your discharge medications or the care you received while you were in the hospital after you are discharged, you can call the unit and asked to speak with the hospitalist on call if the hospitalist that took care of you is not available. Once you are discharged, your primary care physician will handle any further medical issues. Please note that NO REFILLS for any discharge medications will be authorized once you are discharged, as it is imperative that you return to your primary care physician (or establish a relationship with a primary care physician if you do not have one) for your aftercare needs so that they can reassess your need for medications and monitor your lab values.    Today   CHIEF COMPLAINT:   Chief Complaint  Patient presents with  . Altered Mental Status  . Cough    HISTORY OF PRESENT ILLNESS:  Janice Perkins  is a 85 y.o. female came in with altered mental status and difficulty thinking.   VITAL SIGNS:  Blood pressure 132/71, pulse (!) 53, temperature 98 F (36.7 C), temperature source Oral, resp. rate 18, height 5' (1.524 m), weight 69.4 kg, SpO2 100 %.  PHYSICAL EXAMINATION:  GENERAL:  85 y.o.-year-old patient lying in the bed with no acute distress.  EYES: Pupils equal, round, reactive to light and  accommodation. No scleral icterus. Extraocular muscles intact.  HEENT: Head atraumatic, normocephalic. Oropharynx and nasopharynx clear.  NECK:  Supple, no jugular venous distention. No thyroid enlargement, no tenderness.  LUNGS: Normal breath sounds bilaterally, no wheezing, rales,rhonchi or crepitation. No use of accessory muscles of respiration.  CARDIOVASCULAR: S1, S2 normal. No murmurs, rubs, or gallops.  ABDOMEN: Soft, non-tender, non-distended. Bowel sounds present. No organomegaly or mass.  EXTREMITIES: No pedal edema, cyanosis, or clubbing.  NEUROLOGIC: Cranial nerves II through XII are intact. Muscle  strength 5/5 in all extremities. Sensation intact. Gait not checked.  PSYCHIATRIC: The patient is alert and oriented x 3.  SKIN: No obvious rash, lesion, or ulcer.   DATA REVIEW:   CBC Recent Labs  Lab 12/12/18 0451  WBC 7.2  HGB 13.5  HCT 40.1  PLT 177    Chemistries  Recent Labs  Lab 12/11/18 0752 12/12/18 0451  NA 136 133*  K 3.9 4.4  CL 100 99  CO2 28 28  GLUCOSE 110* 106*  BUN 20 18  CREATININE 0.92 1.03*  CALCIUM 9.4 8.9  AST 27  --   ALT 22  --   ALKPHOS 57  --   BILITOT 0.8  --     Cardiac Enzymes Recent Labs  Lab 12/11/18 0752  TROPONINI <0.03    Microbiology Results  Results for orders placed or performed during the hospital encounter of 12/11/18  SARS Coronavirus 2 [facility] order, Performed in [place] hospital lab)     Status: None   Collection Time: 12/11/18  7:52 AM  Result Value Ref Range Status   SARS Coronavirus 2 NEGATIVE NEGATIVE Final    Comment: (NOTE) If result is NEGATIVE SARS-CoV-2 target nucleic acids are NOT DETECTED. The SARS-CoV-2 RNA is generally detectable in upper and lower  respiratory specimens during the acute phase of infection. The lowest  concentration of SARS-CoV-2 viral copies this assay can detect is 250  copies / mL. A negative result does not preclude SARS-CoV-2 infection  and should not be used as the  sole basis for treatment or other  patient management decisions.  A negative result may occur with  improper specimen collection / handling, submission of specimen other  than nasopharyngeal swab, presence of viral mutation(s) within the  areas targeted by this assay, and inadequate number of viral copies  (<250 copies / mL). A negative result must be combined with clinical  observations, patient history, and epidemiological information. If result is POSITIVE SARS-CoV-2 target nucleic acids are DETECTED. The SARS-CoV-2 RNA is generally detectable in upper and lower  respiratory specimens dur ing the acute phase of infection.  Positive  results are indicative of active infection with SARS-CoV-2.  Clinical  correlation with patient history and other diagnostic information is  necessary to determine patient infection status.  Positive results do  not rule out bacterial infection or co-infection with other viruses. If result is PRESUMPTIVE POSTIVE SARS-CoV-2 nucleic acids MAY BE PRESENT.   A presumptive positive result was obtained on the submitted specimen  and confirmed on repeat testing.  While 2019 novel coronavirus  (SARS-CoV-2) nucleic acids may be present in the submitted sample  additional confirmatory testing may be necessary for epidemiological  and / or clinical management purposes  to differentiate between  SARS-CoV-2 and other Sarbecovirus currently known to infect humans.  If clinically indicated additional testing with an alternate test  methodology [phone]) is advised. The SARS-CoV-2 RNA is generally  detectable in upper and lower respiratory sp ecimens during the acute  phase of infection. The expected result is Negative. Fact Sheet for Patients:  StrictlyIdeas.no Fact Sheet for Healthcare Providers: BankingDealers.co.za This test is not yet approved or cleared by the [place] FDA and has been authorized for detection  and/or diagnosis of SARS-CoV-2 by FDA under an Emergency Use Authorization (EUA).  This EUA will remain in effect (meaning this test can be used) for the duration of the COVID-19 declaration under Section 564(b)(1) of the Act, 21 U.S.C. section 360bbb-3(b)(1), unless the  authorization is terminated or revoked sooner. Performed at [facility], [place],  [address]     RADIOLOGY:  Ct Angio Neck W Or Wo Contrast  Result Date: 12/12/2018 CLINICAL DATA:  Acute subcentimeter left frontoparietal cortical infarction seen yesterday by MRI. Dizziness and vertigo. EXAM: CT ANGIOGRAPHY NECK TECHNIQUE: Multidetector CT imaging of the neck was performed using the standard protocol during bolus administration of intravenous contrast. Multiplanar CT image reconstructions and MIPs were obtained to evaluate the vascular anatomy. Carotid stenosis measurements (when applicable) are obtained utilizing NASCET criteria, using the distal internal carotid diameter as the denominator. CONTRAST:  6mL OMNIPAQUE IOHEXOL 350 MG/ML SOLN COMPARISON:  MRI 12/11/2018.  CT 12/11/2018 FINDINGS: Aortic arch: Aortic atherosclerosis.  No aneurysm or dissection. Right carotid system: Common carotid artery widely patent to the bifurcation region. Extensive calcified and soft plaque at the carotid bifurcation and ICA bulb. Minimal diameter is 2.3 mm. Compared to a more distal cervical ICA diameter of 4.7 mm, this indicates a 50% stenosis. There is severe stenosis of the proximal external carotid artery. Left carotid system: Common carotid artery shows atherosclerotic disease at its origin with mild irregularity and stenosis of about 10%. There is calcified plaque at the carotid bifurcation and proximal ICA bulb. Minimal diameter is 4.8 mm, representing a 10% stenosis when compared to the more distal cervical ICA diameter 5.2 mm. There is severe stenosis of the proximal external carotid. Cervical ICA is  tortuous. Vertebral arteries: Both vertebral artery origins are widely patent. Both vertebral arteries appear widely patent through the cervical region to the foramen magnum. Skeleton: Ordinary cervical spondylosis. Cervical facet arthropathy. Other neck: No mass or lymphadenopathy. Upper chest: No acute chest finding. The scan does go through the circle-of-Willis. There is atherosclerotic change in both carotid siphon regions but no flow limiting stenosis. The anterior and middle cerebral vessels are patent without proximal stenosis, aneurysm or vascular malformation. Both vertebral arteries are patent through the foramen magnum to the basilar. No flow limiting stenosis. No basilar stenosis. Posterior circulation branch vessels are patent. IMPRESSION: Right carotid bifurcation: Calcified and soft plaque. 50% stenosis of the proximal ICA. Severe stenosis of the proximal ECA. Left carotid bifurcation: Calcified plaque. 10% stenosis when compared to the more distal cervical ICA. No flow limiting stenosis. The patient does have atherosclerotic disease at the left common carotid origin from the arch with some irregularity but no stenosis greater than 10%. No significant posterior circulation finding. Electronically Signed   By: Nelson Chimes M.D.   On: 12/12/2018 13:42   US Carotid Bilateral  Result Date: 12/12/2018 CLINICAL DATA:  Stroke EXAM: BILATERAL CAROTID DUPLEX ULTRASOUND TECHNIQUE: Pearline Cables scale imaging, color Doppler and duplex ultrasound were performed of bilateral carotid and vertebral arteries in the neck. COMPARISON:  None. FINDINGS: Criteria: Quantification of carotid stenosis is based on velocity parameters that correlate the residual internal carotid diameter with NASCET-based stenosis levels, using the diameter of the distal internal carotid lumen as the denominator for stenosis measurement. The following velocity measurements were obtained: RIGHT ICA: 181 cm/sec CCA: 80 cm/sec SYSTOLIC ICA/CCA RATIO:   2.3 ECA: 166 cm/sec LEFT ICA: 190 cm/sec CCA: 91 cm/sec SYSTOLIC ICA/CCA RATIO:  2.1 ECA: 121 cm/sec RIGHT CAROTID ARTERY: Moderate mixed plaque in the bulb. Low resistance internal carotid Doppler pattern is preserved. RIGHT VERTEBRAL ARTERY:  Antegrade. LEFT CAROTID ARTERY: Moderate predominately calcified plaque in the bulb with shadowing of the lumen. Low resistance internal carotid Doppler pattern is preserved. LEFT VERTEBRAL ARTERY:  Antegrade. Additional findings: The rhythm is irregular suggesting either premature contractions or atrial fibrillation. IMPRESSION: 50-69% stenosis in the right and left internal carotid arteries. Electronically Signed   By: Marybelle Killings M.D.   On: 12/12/2018 07:53     Management plans discussed with the patient, family and they are in agreement.  CODE STATUS:  Code Status History    Date Active Date Inactive Code Status Order ID Comments User Context   12/11/2018 1041 12/13/2018 1437 Full Code [id number]  Loletha Grayer, MD ED   09/02/2018 1156 09/08/2018 1621 Full Code [id number]  Nicholes Mango, MD Inpatient   05/30/2018 1127 05/31/2018 1740 Full Code [id number]  Bettey Costa, MD Inpatient   11/15/2017 1829 11/21/2017 1821 Full Code [id number]  Vaughan Basta, MD Inpatient   05/16/2017 0041 05/17/2017 1824 Full Code [id number]  Quintella Baton, MD ED   03/07/2017 1446 03/09/2017 1509 Full Code [id number]  Idelle Crouch, MD Inpatient   01/19/2017 0837 01/21/2017 1415 Full Code [id number]  Harrie Foreman, MD Inpatient   12/18/2016 0718 12/22/2016 1947 Full Code [id number]  Harrie Foreman, MD Inpatient      TOTAL TIME TAKING CARE OF THIS PATIENT: 35 minutes.    Loletha Grayer M.D on 12/13/2018 at 3:02 PM  Between 7am to 6pm - Pager - [phone]  After 6pm go to [email or web address] - password [email or web address]  [employer] Office  [phone]  CC: Primary care physician; [name], MD

## 2018-12-14 LAB — ECHOCARDIOGRAM COMPLETE
Height: 60 in
Weight: 2448 oz

## 2018-12-23 ENCOUNTER — Emergency Department: Payer: Medicare Other

## 2018-12-23 ENCOUNTER — Encounter: Payer: Self-pay | Admitting: Intensive Care

## 2018-12-23 ENCOUNTER — Emergency Department
Admission: EM | Admit: 2018-12-23 | Discharge: 2018-12-23 | Disposition: A | Discharge status: Home / Self Care | Payer: Medicare Other | Attending: Emergency Medicine | Admitting: Emergency Medicine

## 2018-12-23 ENCOUNTER — Other Ambulatory Visit: Payer: Self-pay

## 2018-12-23 DIAGNOSIS — Z20828 Contact with and (suspected) exposure to other viral communicable diseases: Secondary | ICD-10-CM | POA: Insufficient documentation

## 2018-12-23 DIAGNOSIS — J45909 Unspecified asthma, uncomplicated: Secondary | ICD-10-CM | POA: Diagnosis not present

## 2018-12-23 DIAGNOSIS — Z79899 Other long term (current) drug therapy: Secondary | ICD-10-CM | POA: Diagnosis not present

## 2018-12-23 DIAGNOSIS — Z7901 Long term (current) use of anticoagulants: Secondary | ICD-10-CM | POA: Diagnosis not present

## 2018-12-23 DIAGNOSIS — I10 Essential (primary) hypertension: Secondary | ICD-10-CM | POA: Diagnosis not present

## 2018-12-23 DIAGNOSIS — R4182 Altered mental status, unspecified: Secondary | ICD-10-CM | POA: Diagnosis present

## 2018-12-23 DIAGNOSIS — R531 Weakness: Secondary | ICD-10-CM | POA: Insufficient documentation

## 2018-12-23 DIAGNOSIS — R0602 Shortness of breath: Secondary | ICD-10-CM | POA: Diagnosis not present

## 2018-12-23 DIAGNOSIS — J449 Chronic obstructive pulmonary disease, unspecified: Secondary | ICD-10-CM | POA: Insufficient documentation

## 2018-12-23 DIAGNOSIS — Z9981 Dependence on supplemental oxygen: Secondary | ICD-10-CM | POA: Insufficient documentation

## 2018-12-23 DIAGNOSIS — Z853 Personal history of malignant neoplasm of breast: Secondary | ICD-10-CM | POA: Diagnosis not present

## 2018-12-23 LAB — CBC WITH DIFFERENTIAL/PLATELET
Abs Immature Granulocytes: 0.03 10*3/uL (ref 0.00–0.07)
Basophils Absolute: 0 10*3/uL (ref 0.0–0.1)
Basophils Relative: 1 %
Eosinophils Absolute: 0.2 10*3/uL (ref 0.0–0.5)
Eosinophils Relative: 3 %
HCT: 33.8 % — ABNORMAL LOW (ref 36.0–46.0)
Hemoglobin: 11.7 g/dL — ABNORMAL LOW (ref 12.0–15.0)
Immature Granulocytes: 1 %
Lymphocytes Relative: 25 %
Lymphs Abs: 1.4 10*3/uL (ref 0.7–4.0)
MCH: 31.2 pg (ref 26.0–34.0)
MCHC: 34.6 g/dL (ref 30.0–36.0)
MCV: 90.1 fL (ref 80.0–100.0)
Monocytes Absolute: 0.7 10*3/uL (ref 0.1–1.0)
Monocytes Relative: 13 %
Neutro Abs: 3.2 10*3/uL (ref 1.7–7.7)
Neutrophils Relative %: 57 %
Platelets: 171 10*3/uL (ref 150–400)
RBC: 3.75 MIL/uL — ABNORMAL LOW (ref 3.87–5.11)
RDW: 12.9 % (ref 11.5–15.5)
WBC: 5.6 10*3/uL (ref 4.0–10.5)
nRBC: 0 % (ref 0.0–0.2)

## 2018-12-23 LAB — BRAIN NATRIURETIC PEPTIDE: B Natriuretic Peptide: 155 pg/mL — ABNORMAL HIGH (ref 0.0–100.0)

## 2018-12-23 LAB — COMPREHENSIVE METABOLIC PANEL
ALT: 16 U/L (ref 0–44)
AST: 22 U/L (ref 15–41)
Albumin: 3.8 g/dL (ref 3.5–5.0)
Alkaline Phosphatase: 47 U/L (ref 38–126)
Anion gap: 11 (ref 5–15)
BUN: 13 mg/dL (ref 8–23)
CO2: 26 mmol/L (ref 22–32)
Calcium: 9.1 mg/dL (ref 8.9–10.3)
Chloride: 93 mmol/L — ABNORMAL LOW (ref 98–111)
Creatinine, Ser: 0.78 mg/dL (ref 0.44–1.00)
GFR calc Af Amer: >60 mL/min (ref >60)
GFR calc non Af Amer: >60 mL/min (ref >60)
Glucose, Bld: 89 mg/dL (ref 70–99)
Potassium: 3.9 mmol/L (ref 3.5–5.1)
Sodium: 130 mmol/L — ABNORMAL LOW (ref 135–145)
Total Bilirubin: 0.6 mg/dL (ref 0.3–1.2)
Total Protein: 6.5 g/dL (ref 6.5–8.1)

## 2018-12-23 LAB — URINALYSIS, COMPLETE (UACMP) WITH MICROSCOPIC
Bacteria, UA: NONE SEEN
Bilirubin Urine: NEGATIVE
Glucose, UA: NEGATIVE mg/dL
Hgb urine dipstick: NEGATIVE
Ketones, ur: NEGATIVE mg/dL
Leukocytes,Ua: NEGATIVE
Nitrite: NEGATIVE
Protein, ur: NEGATIVE mg/dL
Specific Gravity, Urine: 1.004 — ABNORMAL LOW (ref 1.005–1.030)
pH: 7 (ref 5.0–8.0)

## 2018-12-23 LAB — SARS CORONAVIRUS 2 BY RT PCR (HOSPITAL ORDER, PERFORMED IN ~~LOC~~ HOSPITAL LAB): SARS Coronavirus 2: NEGATIVE

## 2018-12-23 LAB — ETHANOL: Alcohol, Ethyl (B): <10 mg/dL (ref <10)

## 2018-12-23 LAB — TROPONIN I: Troponin I: <0.03 ng/mL (ref <0.03)

## 2018-12-23 NOTE — ED Notes (Signed)
Pt back to room from ct at this time .

## 2018-12-23 NOTE — ED Triage Notes (Addendum)
Patient states "I feel short winded and confused" Patient states she was recently admitted for stroke. All extremities strong and equal. HX right sided breast cancer. A&O x4. No facial droop. Speech clear. Patient very talkative. Speaks in complete sentences with no labored breathing noted

## 2018-12-23 NOTE — ED Provider Notes (Addendum)
[facility] Emergency Department Provider Note  ____________________________________________   I have reviewed the triage vital signs and the nursing notes. Where available I have reviewed prior notes and, if possible and indicated, outside hospital notes.    HISTORY  Chief Complaint Altered Mental Status and Shortness of Breath    HPI Janice Perkins is a 85 y.o. female  With a history of asthma, anxiety, atrial fibrillation, COPD, recent stroke presents today complaining that for a few seconds this morning she was mildly confused in the night got better but also she has been coughing at the nighttime for years and she wants to make sure that is okay to.  She does not feel that she is ongoingly confused she does not have any dyspnea on exertion chest pain shortness of breath nausea or vomiting abdominal pain.  She denies any fever.  She states is been coughing like this for years.  They did change her medication as a result she states.  Cough is not usually productive.  She denies any other infectious symptoms and she does not want to be here for very long because she has to get away from coronavirus if possible.  Patient was recently hospitalized.  She denies any fall closed head injury or focal numbness or weakness.  She states that for a few seconds she felt slightly confused although she had no other symptoms and it lasted very briefly.  No headache. Does have a history of poorly controlled hypertension.   Past Medical History:  Diagnosis Date  . Asthma   . Atrial fibrillation ([place])   . Breast cancer ([place]) 2011  . Bronchitis 04/2015  . Cancer [facility]) 2011   breast  . COPD (chronic obstructive pulmonary disease) ([place])   . Hypertension   . Shingles    10/2015    Patient Active Problem List   Diagnosis Date Noted  . Hypertensive encephalopathy 12/11/2018  . Hypoxia 05/30/2018  . Leukocytosis 04/05/2018  . Pneumonia 11/15/2017  . Pleural effusion 05/16/2017   . AF (paroxysmal atrial fibrillation) ([place]) 05/16/2017  . Breast cancer ([place]) 05/16/2017  . Dependence on nocturnal oxygen therapy 05/16/2017  . HTN (hypertension) 05/16/2017  . Generalized weakness 03/07/2017  . Hyponatremia 03/07/2017  . Dehydration 03/07/2017  . UTI (urinary tract infection) 03/07/2017  . HCAP (healthcare-associated pneumonia) 01/19/2017  . AKI (acute kidney injury) ([place]) 12/18/2016  . Primary cancer of upper outer quadrant of right female breast ([place]) 04/07/2016  . Lumbar radiculopathy 03/23/2016  . Spinal stenosis, lumbar region, with neurogenic claudication 03/23/2016  . DDD (degenerative disc disease), lumbar 01/14/2015  . Lumbosacral facet joint syndrome 01/14/2015  . Sacroiliac joint dysfunction 01/14/2015  . Greater trochanteric bursitis 01/14/2015  . Bilateral occipital neuralgia 01/14/2015    Past Surgical History:  Procedure Laterality Date  . BREAST EXCISIONAL BIOPSY Right 11/29/13   two areas FAT NECROSIS  . BREAST EXCISIONAL BIOPSY Right 10/30/2009   lumpectomy - radiation  . COLONOSCOPY WITH PROPOFOL N/A 06/10/2018   Procedure: COLONOSCOPY WITH PROPOFOL;  Surgeon: Manya Silvas, MD;  Location: [facility] ENDOSCOPY;  Service: Endoscopy;  Laterality: N/A;  . ESOPHAGOGASTRODUODENOSCOPY (EGD) WITH PROPOFOL N/A 06/10/2018   Procedure: ESOPHAGOGASTRODUODENOSCOPY (EGD) WITH PROPOFOL;  Surgeon: Manya Silvas, MD;  Location: [facility] ENDOSCOPY;  Service: Endoscopy;  Laterality: N/A;  . NASAL SINUS SURGERY      Prior to Admission medications   Medication Sig Start Date End Date Taking? Authorizing Provider  albuterol (ACCUNEB) 1.25 MG/3ML nebulizer solution Inhale 3 mLs  into the lungs every 6 (six) hours as needed for wheezing.     [provider]  albuterol (PROVENTIL HFA;VENTOLIN HFA) 108 (90 Base) MCG/ACT inhaler Inhale 2 puffs into the lungs every 6 (six) hours as needed for wheezing or shortness of breath.     [provider]   ALPRAZolam Duanne Moron) 0.5 MG tablet Take 0.5 mg by mouth daily as needed for anxiety or sleep.    [provider]  apixaban (ELIQUIS) 5 MG TABS tablet Take 1 tablet (5 mg total) by mouth 2 (two) times daily. 12/13/18   Loletha Grayer, MD  atorvastatin (LIPITOR) 20 MG tablet Take 20 mg by mouth at bedtime.     [provider]  busPIRone (BUSPAR) 10 MG tablet Take 10 mg by mouth 2 (two) times daily.    [provider]  cholecalciferol (VITAMIN D) 1000 units tablet Take 1,000 Units by mouth daily.    [provider]  diltiazem (DILACOR XR) 120 MG 24 hr capsule Take 120 mg by mouth daily.    [provider]  esomeprazole (NEXIUM) 40 MG capsule Take 40 mg by mouth 2 (two) times daily before a meal.     [provider]  ferrous sulfate 325 (65 FE) MG tablet Take 325 mg by mouth daily with breakfast.    [provider]  Fluticasone-Salmeterol (ADVAIR) 250-50 MCG/DOSE AEPB Inhale 1 puff into the lungs 2 (two) times daily.    [provider]  hydrALAZINE (APRESOLINE) 10 MG tablet Take 1 tablet (10 mg total) by mouth 3 (three) times daily. 12/13/18   Loletha Grayer, MD  HYDROcodone-acetaminophen (NORCO/VICODIN) 5-325 MG tablet Take 1 tablet by mouth 2 (two) times daily as needed for moderate pain or severe pain.     [provider]  levothyroxine (SYNTHROID, LEVOTHROID) 50 MCG tablet Take 50 mcg by mouth daily before breakfast.    [provider]  loratadine (CLARITIN) 10 MG tablet Take 10 mg by mouth daily.    [provider]  losartan (COZAAR) 50 MG tablet Take 2 tablets (100 mg total) by mouth at bedtime. 12/13/18   Loletha Grayer, MD  magnesium oxide (MAG-OX) 400 MG tablet Take 400 mg by mouth 2 (two) times daily.    [provider]  meclizine (ANTIVERT) 25 MG tablet Take 1 tablet (25 mg total) by mouth 2 (two) times daily as needed for dizziness. Patient not taking: Reported on 12/11/2018 09/16/18    Merlyn Lot, MD  mometasone-formoterol [facility]) 100-5 MCG/ACT AERO Inhale 2 puffs into the lungs 2 (two) times daily.    [provider]    Allergies Diphenhydramine hcl; Penicillins; Captopril; Enalapril maleate; Tape; Terfenadine; Augmentin [amoxicillin-pot clavulanate]; and Biaxin [clarithromycin]  Family History  Problem Relation Age of Onset  . Hypertension Mother   . Heart disease Mother   . Cancer Mother   . Stroke Father   . Hypertension Father   . Breast cancer Sister     Social History Social History   Tobacco Use  . Smoking status: Never Smoker  . Smokeless tobacco: Never Used  Substance Use Topics  . Alcohol use: No    Alcohol/week: 0.0 standard drinks  . Drug use: No    Review of Systems Constitutional: No fever/chills Eyes: No visual changes. ENT: No sore throat. No stiff neck no neck pain Cardiovascular: Denies chest pain. Respiratory: Denies shortness of breath. Gastrointestinal:   no vomiting.  No diarrhea.  No constipation. Genitourinary: Negative for dysuria. Musculoskeletal: Negative lower  extremity swelling Skin: Negative for rash. Neurological: Negative for severe headaches, focal weakness or numbness.   ____________________________________________   PHYSICAL EXAM:  VITAL SIGNS: ED Triage Vitals  Enc Vitals Group     BP 12/23/18 1837 (!) 168/58     Pulse Rate 12/23/18 1837 74     Resp 12/23/18 1837 18     Temp 12/23/18 1837 98.2 F (36.8 C)     Temp Source 12/23/18 1837 Oral     SpO2 12/23/18 1837 99 %     Weight 12/23/18 1838 153 lb (69.4 kg)     Height 12/23/18 1838 5' (1.524 m)     Head Circumference --      Peak Flow --      Pain Score 12/23/18 1837 0     Pain Loc --      Pain Edu? --      Excl. in [place]? --     Constitutional: Alert and oriented. Well appearing and in no acute distress. Eyes: Conjunctivae are normal Head: Atraumatic HEENT: No congestion/rhinnorhea. Mucous membranes are moist.  Oropharynx  non-erythematous Neck:   Nontender with no meningismus, no masses, no stridor Cardiovascular: Normal rate, regular rhythm. Grossly normal heart sounds.  Good peripheral circulation. Respiratory: Normal respiratory effort.  No retractions. Lungs CTAB. Abdominal: Soft and nontender. No distention. No guarding no rebound Back:  There is no focal tenderness or step off.  there is no midline tenderness there are no lesions noted. there is no CVA tenderness Musculoskeletal: No lower extremity tenderness, no upper extremity tenderness. No joint effusions, no DVT signs strong distal pulses no edema Neurologic:  Normal speech and language. No gross focal neurologic deficits are appreciated.  NIH stroke scale negative. Skin:  Skin is warm, dry and intact. No rash noted. Psychiatric: Mood and affect are normal. Speech and behavior are normal.  ____________________________________________   LABS (all labs ordered are listed, but only abnormal results are displayed)  Labs Reviewed  SARS CORONAVIRUS 2 (HOSPITAL ORDER, [place] [facility])  URINALYSIS, COMPLETE (UACMP) WITH MICROSCOPIC  CBC WITH DIFFERENTIAL/PLATELET  COMPREHENSIVE METABOLIC PANEL  ETHANOL  BRAIN NATRIURETIC PEPTIDE  TROPONIN I    Pertinent labs  results that were available during my care of the patient were reviewed by me and considered in my medical decision making (see chart for details). ____________________________________________  EKG  I personally interpreted any EKGs ordered by me or triage Rate 69 bpm, A. fib most likely underlying rhythm, no acute ST elevation or depression normal axis unremarkable EKG aside from baseline A. fib.  NIH ____________________________________________  RADIOLOGY  Pertinent labs & imaging results that were available during my care of the patient were reviewed by me and considered in my medical decision making (see chart for details). If possible, patient and/or family made  aware of any abnormal findings.  Dg Chest Port 1 View  Result Date: 12/23/2018 CLINICAL DATA:  Weakness. Dyspnea. Confusion. EXAM: PORTABLE CHEST 1 VIEW COMPARISON:  12/11/2018 FINDINGS: The cardiac silhouette remains mildly enlarged. Aortic atherosclerosis is noted. There is chronic peribronchial thickening. No acute airspace consolidation, overt pulmonary edema, sizable pleural effusion, pneumothorax is identified. Prior left shoulder arthroplasty is noted. IMPRESSION: Chronic bronchitic changes without evidence of acute airspace disease. Electronically Signed   By: Logan Bores M.D.   On: 12/23/2018 19:16   ____________________________________________    PROCEDURES  Procedure(s) performed: None  Procedures  Critical Care performed: None  ____________________________________________   INITIAL IMPRESSION / ASSESSMENT AND PLAN /  ED COURSE  Pertinent labs & imaging results that were available during my care of the patient were reviewed by me and considered in my medical decision making (see chart for details).  Well-appearing woman who is able to ambulate the entire length of the department with sats 9 with no evidence of respiratory distress.  She is here for chronic cough which is been there every night for months to years.  Also she states her anxiety is acting up.  At this time there seems be more evidence of the latter than the former but we will do evaluation including coronavirus testing and reassess.  Patient seen and evaluated during the coronavirus epidemic during a time with low staffing.  Janice Perkins was evaluated in Emergency Department on 12/23/2018 for the symptoms described in the history of present illness. She was evaluated in the context of the global COVID-19 pandemic, which necessitated consideration that the patient might be at risk for infection with the SARS-CoV-2 virus that causes COVID-19. Institutional protocols and algorithms that pertain to the evaluation of  patients at risk for COVID-19 are in a state of rapid change based on information released by regulatory bodies including the CDC and federal and state organizations. These policies and algorithms were followed during the patient's care in the ED.  ----------------------------------------- 8:57 PM on 12/23/2018 -----------------------------------------  Patient with very significantly reassuring work-up chest x-ray blood work vital signs coronavirus test etc. all reassuring.  She has no complaints or symptoms at this time.  She states she thinks she just got anxious.  We talked about further work-up for evaluation and she would much prefer to go home.  She is very nervous about catching coronavirus testing here which is not an unreasonable fear as there are patients in the department who do have it.  I do not see any indication for emergent CT, and her sodium is borderline but I do not think enough to cause confusion or any acute symptoms.  It is very close to her baseline    ____________________________________________   FINAL CLINICAL IMPRESSION(S) / ED DIAGNOSES  Final diagnoses:  Weakness      This chart was dictated using voice recognition software.  Despite best efforts to proofread,  errors can occur which can change meaning.      Schuyler Amor, MD 12/23/18 Kathyrn Drown    Schuyler Amor, MD 12/23/18 1944    Schuyler Amor, MD 12/23/18 2102

## 2018-12-23 NOTE — Discharge Instructions (Signed)
If you have chest pain, shortness of breath, fever, persistent cough, nausea, vomiting diarrhea headache stiff neck numbness or weakness in any part of your body, trouble focusing or other concerns return to the emergency department.

## 2018-12-28 ENCOUNTER — Other Ambulatory Visit: Payer: Self-pay

## 2018-12-28 ENCOUNTER — Encounter: Payer: Self-pay | Admitting: Emergency Medicine

## 2018-12-28 ENCOUNTER — Emergency Department
Admission: EM | Admit: 2018-12-28 | Discharge: 2018-12-28 | Disposition: A | Discharge status: Home / Self Care | Payer: Medicare Other | Attending: Emergency Medicine | Admitting: Emergency Medicine

## 2018-12-28 ENCOUNTER — Emergency Department: Payer: Medicare Other

## 2018-12-28 DIAGNOSIS — R42 Dizziness and giddiness: Secondary | ICD-10-CM | POA: Diagnosis present

## 2018-12-28 DIAGNOSIS — J45909 Unspecified asthma, uncomplicated: Secondary | ICD-10-CM | POA: Diagnosis not present

## 2018-12-28 DIAGNOSIS — Z79899 Other long term (current) drug therapy: Secondary | ICD-10-CM | POA: Diagnosis not present

## 2018-12-28 DIAGNOSIS — Z7901 Long term (current) use of anticoagulants: Secondary | ICD-10-CM | POA: Insufficient documentation

## 2018-12-28 DIAGNOSIS — I10 Essential (primary) hypertension: Secondary | ICD-10-CM | POA: Diagnosis not present

## 2018-12-28 DIAGNOSIS — J449 Chronic obstructive pulmonary disease, unspecified: Secondary | ICD-10-CM | POA: Diagnosis not present

## 2018-12-28 DIAGNOSIS — Z853 Personal history of malignant neoplasm of breast: Secondary | ICD-10-CM | POA: Diagnosis not present

## 2018-12-28 DIAGNOSIS — H81399 Other peripheral vertigo, unspecified ear: Secondary | ICD-10-CM | POA: Insufficient documentation

## 2018-12-28 LAB — COMPREHENSIVE METABOLIC PANEL
ALT: 23 U/L (ref 0–44)
AST: 30 U/L (ref 15–41)
Albumin: 4.5 g/dL (ref 3.5–5.0)
Alkaline Phosphatase: 49 U/L (ref 38–126)
Anion gap: 11 (ref 5–15)
BUN: 19 mg/dL (ref 8–23)
CO2: 26 mmol/L (ref 22–32)
Calcium: 9.3 mg/dL (ref 8.9–10.3)
Chloride: 92 mmol/L — ABNORMAL LOW (ref 98–111)
Creatinine, Ser: 0.93 mg/dL (ref 0.44–1.00)
GFR calc Af Amer: >60 mL/min (ref >60)
GFR calc non Af Amer: 56 mL/min — ABNORMAL LOW (ref >60)
Glucose, Bld: 106 mg/dL — ABNORMAL HIGH (ref 70–99)
Potassium: 3.9 mmol/L (ref 3.5–5.1)
Sodium: 129 mmol/L — ABNORMAL LOW (ref 135–145)
Total Bilirubin: 0.6 mg/dL (ref 0.3–1.2)
Total Protein: 7.3 g/dL (ref 6.5–8.1)

## 2018-12-28 LAB — CBC WITH DIFFERENTIAL/PLATELET
Abs Immature Granulocytes: 0.03 10*3/uL (ref 0.00–0.07)
Basophils Absolute: 0 10*3/uL (ref 0.0–0.1)
Basophils Relative: 1 %
Eosinophils Absolute: 0.2 10*3/uL (ref 0.0–0.5)
Eosinophils Relative: 3 %
HCT: 36.6 % (ref 36.0–46.0)
Hemoglobin: 13.1 g/dL (ref 12.0–15.0)
Immature Granulocytes: 1 %
Lymphocytes Relative: 24 %
Lymphs Abs: 1.5 10*3/uL (ref 0.7–4.0)
MCH: 31.7 pg (ref 26.0–34.0)
MCHC: 35.8 g/dL (ref 30.0–36.0)
MCV: 88.6 fL (ref 80.0–100.0)
Monocytes Absolute: 0.7 10*3/uL (ref 0.1–1.0)
Monocytes Relative: 12 %
Neutro Abs: 3.6 10*3/uL (ref 1.7–7.7)
Neutrophils Relative %: 59 %
Platelets: 203 10*3/uL (ref 150–400)
RBC: 4.13 MIL/uL (ref 3.87–5.11)
RDW: 12.9 % (ref 11.5–15.5)
WBC: 6.1 10*3/uL (ref 4.0–10.5)
nRBC: 0 % (ref 0.0–0.2)

## 2018-12-28 LAB — TROPONIN I: Troponin I: <0.03 ng/mL (ref <0.03)

## 2018-12-28 MED ORDER — MECLIZINE HCL 25 MG PO TABS
25.0000 mg | ORAL_TABLET | Freq: Once | ORAL | Status: AC
  Administered 2018-12-28: 25 mg via ORAL
  Filled 2018-12-28: qty 1

## 2018-12-28 NOTE — ED Triage Notes (Addendum)
Pt to triage via w/c with no distress noted, rambling speech; pt c/o right sided HA & dizziness today accomp by nausea; st tx for vertigo recently but has not taken her meclizine for such; currently taking eloquist, hx afib

## 2018-12-28 NOTE — Discharge Instructions (Addendum)
Your labs and CT scans today were okay.  Your dizziness appears to be due to ongoing vertigo, but it could be also related to dehydration due to your limited fluid intake.  You can try increasing your fluids tomorrow by a half bottle, but you will need to take additional salt with your food.  Continue monitoring your weight every day.

## 2018-12-28 NOTE — ED Provider Notes (Signed)
[facility] Emergency Department Provider Note  ____________________________________________  Time seen: Approximately 9:21 PM  I have reviewed the triage vital signs and the nursing notes.   HISTORY  Chief Complaint Dizziness    HPI Janice Perkins is a 85 y.o. female with a history of atrial fibrillation, breast cancer, COPD, hypertension, vertigo who comes the ED complaining of dizziness today that is recurrent and usually managed with meclizine.  She has not taken meclizine today.  She has been taking her other medications.  She denies any new falls or head injuries.  No vision changes paresthesias or motor weakness.  Mainly she just feels unsteady when she stands up.  She has been evaluated for this before.  She also notes that she has been compliant with her fluid restriction recommendations for her chronic hyponatremia.  Symptoms are intermittent, worse with standing up, better lying down.  No fevers or chills, no neck pain or stiffness.  She reports a mild dull headache but not thunderclap or severe.   She notes severe issues with anxiety.  Her cardiologist has put her on Xanax.   Past Medical History:  Diagnosis Date  . Asthma   . Atrial fibrillation ([place])   . Breast cancer ([place]) 2011  . Bronchitis 04/2015  . Cancer [facility]) 2011   breast  . COPD (chronic obstructive pulmonary disease) ([place])   . Hypertension   . Shingles    10/2015     Patient Active Problem List   Diagnosis Date Noted  . Hypertensive encephalopathy 12/11/2018  . Hypoxia 05/30/2018  . Leukocytosis 04/05/2018  . Pneumonia 11/15/2017  . Pleural effusion 05/16/2017  . AF (paroxysmal atrial fibrillation) ([place]) 05/16/2017  . Breast cancer ([place]) 05/16/2017  . Dependence on nocturnal oxygen therapy 05/16/2017  . HTN (hypertension) 05/16/2017  . Generalized weakness 03/07/2017  . Hyponatremia 03/07/2017  . Dehydration 03/07/2017  . UTI (urinary tract infection) 03/07/2017  . HCAP  (healthcare-associated pneumonia) 01/19/2017  . AKI (acute kidney injury) ([place]) 12/18/2016  . Primary cancer of upper outer quadrant of right female breast ([place]) 04/07/2016  . Lumbar radiculopathy 03/23/2016  . Spinal stenosis, lumbar region, with neurogenic claudication 03/23/2016  . DDD (degenerative disc disease), lumbar 01/14/2015  . Lumbosacral facet joint syndrome 01/14/2015  . Sacroiliac joint dysfunction 01/14/2015  . Greater trochanteric bursitis 01/14/2015  . Bilateral occipital neuralgia 01/14/2015     Past Surgical History:  Procedure Laterality Date  . BREAST EXCISIONAL BIOPSY Right 11/29/13   two areas FAT NECROSIS  . BREAST EXCISIONAL BIOPSY Right 10/30/2009   lumpectomy - radiation  . COLONOSCOPY WITH PROPOFOL N/A 06/10/2018   Procedure: COLONOSCOPY WITH PROPOFOL;  Surgeon: Manya Silvas, MD;  Location: [facility] ENDOSCOPY;  Service: Endoscopy;  Laterality: N/A;  . ESOPHAGOGASTRODUODENOSCOPY (EGD) WITH PROPOFOL N/A 06/10/2018   Procedure: ESOPHAGOGASTRODUODENOSCOPY (EGD) WITH PROPOFOL;  Surgeon: Manya Silvas, MD;  Location: [facility] ENDOSCOPY;  Service: Endoscopy;  Laterality: N/A;  . NASAL SINUS SURGERY       Prior to Admission medications   Medication Sig Start Date End Date Taking? Authorizing Provider  albuterol (ACCUNEB) 1.25 MG/3ML nebulizer solution Inhale 3 mLs into the lungs every 6 (six) hours as needed for wheezing.     [provider]  albuterol (PROVENTIL HFA;VENTOLIN HFA) 108 (90 Base) MCG/ACT inhaler Inhale 2 puffs into the lungs every 6 (six) hours as needed for wheezing or shortness of breath.     [provider]  ALPRAZolam Duanne Moron) 0.5 MG tablet Take 0.5 mg  by mouth daily as needed for anxiety or sleep.    [provider]  apixaban (ELIQUIS) 5 MG TABS tablet Take 1 tablet (5 mg total) by mouth 2 (two) times daily. 12/13/18   Loletha Grayer, MD  atorvastatin (LIPITOR) 20 MG tablet Take 20 mg by mouth at bedtime.     [provider]  busPIRone (BUSPAR) 10 MG tablet Take 10 mg by mouth 2 (two) times daily.    [provider]  cholecalciferol (VITAMIN D) 1000 units tablet Take 1,000 Units by mouth daily.    [provider]  diltiazem (DILACOR XR) 120 MG 24 hr capsule Take 120 mg by mouth daily.    [provider]  esomeprazole (NEXIUM) 40 MG capsule Take 40 mg by mouth 2 (two) times daily before a meal.     [provider]  ferrous sulfate 325 (65 FE) MG tablet Take 325 mg by mouth daily with breakfast.    [provider]  Fluticasone-Salmeterol (ADVAIR) 250-50 MCG/DOSE AEPB Inhale 1 puff into the lungs 2 (two) times daily.    [provider]  hydrALAZINE (APRESOLINE) 10 MG tablet Take 1 tablet (10 mg total) by mouth 3 (three) times daily. 12/13/18   Loletha Grayer, MD  HYDROcodone-acetaminophen (NORCO/VICODIN) 5-325 MG tablet Take 1 tablet by mouth 2 (two) times daily as needed for moderate pain or severe pain.     [provider]  levothyroxine (SYNTHROID, LEVOTHROID) 50 MCG tablet Take 50 mcg by mouth daily before breakfast.    [provider]  loratadine (CLARITIN) 10 MG tablet Take 10 mg by mouth daily.    [provider]  losartan (COZAAR) 50 MG tablet Take 2 tablets (100 mg total) by mouth at bedtime. 12/13/18   Loletha Grayer, MD  magnesium oxide (MAG-OX) 400 MG tablet Take 400 mg by mouth 2 (two) times daily.    [provider]  meclizine (ANTIVERT) 25 MG tablet Take 1 tablet (25 mg total) by mouth 2 (two) times daily as needed for dizziness. Patient not taking: Reported on 12/11/2018 09/16/18   Merlyn Lot, MD  mometasone-formoterol [facility]) 100-5 MCG/ACT AERO Inhale 2 puffs into the lungs 2 (two) times daily.    [provider]     Allergies Diphenhydramine hcl; Penicillins; Captopril; Enalapril maleate; Tape; Terfenadine; Augmentin [amoxicillin-pot clavulanate]; and Biaxin  [clarithromycin]   Family History  Problem Relation Age of Onset  . Hypertension Mother   . Heart disease Mother   . Cancer Mother   . Stroke Father   . Hypertension Father   . Breast cancer Sister     Social History Social History   Tobacco Use  . Smoking status: Never Smoker  . Smokeless tobacco: Never Used  Substance Use Topics  . Alcohol use: No    Alcohol/week: 0.0 standard drinks  . Drug use: No    Review of Systems  Constitutional:   No fever or chills.  ENT:   No sore throat. No rhinorrhea. Cardiovascular:   No chest pain or syncope. Respiratory:   No dyspnea or cough. Gastrointestinal:   Negative for abdominal pain, vomiting and diarrhea.  Musculoskeletal:   Negative for focal pain or swelling All other systems reviewed and are negative except as documented above in ROS and HPI.  ____________________________________________   PHYSICAL EXAM:  VITAL SIGNS: ED Triage Vitals  Enc Vitals Group     BP 12/28/18 1917 (!) 167/83     Pulse Rate 12/28/18 1917 78  Resp 12/28/18 1917 18     Temp 12/28/18 1917 97.7 F (36.5 C)     Temp Source 12/28/18 1917 Oral     SpO2 12/28/18 1917 97 %     Weight 12/28/18 1915 153 lb (69.4 kg)     Height 12/28/18 1915 5' (1.524 m)     Head Circumference --      Peak Flow --      Pain Score 12/28/18 1914 3     Pain Loc --      Pain Edu? --      Excl. in [place]? --     Vital signs reviewed, nursing assessments reviewed.   Constitutional:   Alert and oriented. Non-toxic appearance. Eyes:   Conjunctivae are normal. EOMI. PERRL.  No nystagmus ENT      Head:   Normocephalic and atraumatic.      Nose:   No congestion/rhinnorhea.       Mouth/Throat:   MMM, no pharyngeal erythema. No peritonsillar mass.       Neck:   No meningismus. Full ROM.  No midline spinal tenderness Hematological/Lymphatic/Immunilogical:   No cervical lymphadenopathy. Cardiovascular:   RRR. Symmetric bilateral radial and DP pulses.  No murmurs. Cap  refill less than 2 seconds. Respiratory:   Normal respiratory effort without tachypnea/retractions. Breath sounds are clear and equal bilaterally. No wheezes/rales/rhonchi. Gastrointestinal:   Soft and nontender. Non distended. There is no CVA tenderness.  No rebound, rigidity, or guarding.  Musculoskeletal:   Normal range of motion in all extremities. No joint effusions.  No lower extremity tenderness.  No edema. Neurologic:   Normal speech and language.  Motor grossly intact. Cranial nerves III through XII intact Finger-to-nose intact No pronator drift No acute focal neurologic deficits are appreciated.  Skin:    Skin is warm, dry and intact. No rash noted.  No petechiae, purpura, or bullae.  ____________________________________________    LABS (pertinent positives/negatives) (all labs ordered are listed, but only abnormal results are displayed) Labs Reviewed  COMPREHENSIVE METABOLIC PANEL - Abnormal; Notable for the following components:      Result Value   Sodium 129 (*)    Chloride 92 (*)    Glucose, Bld 106 (*)    GFR calc non Af Amer 56 (*)    All other components within normal limits  CBC WITH DIFFERENTIAL/PLATELET  TROPONIN I   ____________________________________________   EKG  Interpreted by me Atrial fibrillation rate of 77, left axis, normal intervals.  Poor R wave progression.  Normal ST segments and T waves.  ____________________________________________    RADIOLOGY  Ct Head Wo Contrast  Result Date: 12/28/2018 CLINICAL DATA:  Right-sided headache and dizziness with nausea. EXAM: CT HEAD WITHOUT CONTRAST TECHNIQUE: Contiguous axial images were obtained from the base of the skull through the vertex without intravenous contrast. COMPARISON:  12/23/2018 FINDINGS: Brain: There is no evidence of acute infarct, intracranial hemorrhage, mass, midline shift, or extra-axial fluid collection. Mild cerebral atrophy is not greater than expected for age. Cerebral white  matter hypodensities are unchanged and nonspecific but compatible with mild chronic small vessel ischemic disease. Vascular: Calcified atherosclerosis at the skull base. No hyperdense vessel. Skull: No fracture or focal osseous lesion. Sinuses/Orbits: No acute findings in the visualized portions of the paranasal sinuses. Persistent small right mastoid effusion. Bilateral cataract extraction. Other: None. IMPRESSION: 1. No evidence of acute intracranial abnormality. 2. Mild chronic small vessel ischemic disease. Electronically Signed   By: Logan Bores M.D.   On: 12/28/2018  21:14    ____________________________________________   PROCEDURES Procedures  ____________________________________________  DIFFERENTIAL DIAGNOSIS   Intracranial hemorrhage, subacute stroke, recurrent vertigo, worsened hyponatremia, dehydration  CLINICAL IMPRESSION / ASSESSMENT AND PLAN / ED COURSE  Medications ordered in the ED: Medications  meclizine (ANTIVERT) tablet 25 mg (25 mg Oral Given 12/28/18 2109)    Pertinent labs & imaging results that were available during my care of the patient were reviewed by me and considered in my medical decision making (see chart for details).  Janice Perkins was evaluated in Emergency Department on 12/28/2018 for the symptoms described in the history of present illness. She was evaluated in the context of the global COVID-19 pandemic, which necessitated consideration that the patient might be at risk for infection with the SARS-CoV-2 virus that causes COVID-19. Institutional protocols and algorithms that pertain to the evaluation of patients at risk for COVID-19 are in a state of rapid change based on information released by regulatory bodies including the CDC and federal and state organizations. These policies and algorithms were followed during the patient's care in the ED.   Patient presents with positional vertigo.  Most likely benign versus hyponatremia versus dehydration.  Doubt  stroke but due to her age and prior medical history I will obtain a CT scan.  Labs show stable chronic hyponatremia which is mild and unlikely to be the cause of her symptoms today.  We will follow-up CT and response to meclizine.  ----------------------------------------- 10:15 PM on 12/28/2018 -----------------------------------------  Feels fine.  CT negative.  Counseled on increasing her fluids a little bit tomorrow for hydration but increasing her salt intake as well and following up with primary care.  Continue meclizine.      ____________________________________________   FINAL CLINICAL IMPRESSION(S) / ED DIAGNOSES    Final diagnoses:  Peripheral vertigo, unspecified laterality     ED Discharge Orders    None      Portions of this note were generated with dragon dictation software. Dictation errors may occur despite best attempts at proofreading.   Carrie Mew, MD 12/28/18 2215

## 2019-01-03 ENCOUNTER — Emergency Department: Payer: Medicare Other

## 2019-01-03 ENCOUNTER — Other Ambulatory Visit: Payer: Self-pay

## 2019-01-03 ENCOUNTER — Emergency Department
Admission: EM | Admit: 2019-01-03 | Discharge: 2019-01-03 | Disposition: A | Discharge status: Home / Self Care | Payer: Medicare Other | Attending: Emergency Medicine | Admitting: Emergency Medicine

## 2019-01-03 DIAGNOSIS — J45909 Unspecified asthma, uncomplicated: Secondary | ICD-10-CM | POA: Diagnosis not present

## 2019-01-03 DIAGNOSIS — K625 Hemorrhage of anus and rectum: Secondary | ICD-10-CM | POA: Diagnosis present

## 2019-01-03 DIAGNOSIS — I10 Essential (primary) hypertension: Secondary | ICD-10-CM | POA: Insufficient documentation

## 2019-01-03 DIAGNOSIS — J449 Chronic obstructive pulmonary disease, unspecified: Secondary | ICD-10-CM | POA: Insufficient documentation

## 2019-01-03 DIAGNOSIS — K5900 Constipation, unspecified: Secondary | ICD-10-CM | POA: Insufficient documentation

## 2019-01-03 LAB — CBC
HCT: 38.5 % (ref 36.0–46.0)
Hemoglobin: 13.3 g/dL (ref 12.0–15.0)
MCH: 31.3 pg (ref 26.0–34.0)
MCHC: 34.5 g/dL (ref 30.0–36.0)
MCV: 90.6 fL (ref 80.0–100.0)
Platelets: 209 10*3/uL (ref 150–400)
RBC: 4.25 MIL/uL (ref 3.87–5.11)
RDW: 13.2 % (ref 11.5–15.5)
WBC: 5.8 10*3/uL (ref 4.0–10.5)
nRBC: 0 % (ref 0.0–0.2)

## 2019-01-03 LAB — COMPREHENSIVE METABOLIC PANEL
ALT: 18 U/L (ref 0–44)
AST: 26 U/L (ref 15–41)
Albumin: 4.1 g/dL (ref 3.5–5.0)
Alkaline Phosphatase: 53 U/L (ref 38–126)
Anion gap: 8 (ref 5–15)
BUN: 11 mg/dL (ref 8–23)
CO2: 28 mmol/L (ref 22–32)
Calcium: 9.7 mg/dL (ref 8.9–10.3)
Chloride: 97 mmol/L — ABNORMAL LOW (ref 98–111)
Creatinine, Ser: 0.92 mg/dL (ref 0.44–1.00)
GFR calc Af Amer: >60 mL/min (ref >60)
GFR calc non Af Amer: 57 mL/min — ABNORMAL LOW (ref >60)
Glucose, Bld: 117 mg/dL — ABNORMAL HIGH (ref 70–99)
Potassium: 4.2 mmol/L (ref 3.5–5.1)
Sodium: 133 mmol/L — ABNORMAL LOW (ref 135–145)
Total Bilirubin: 1 mg/dL (ref 0.3–1.2)
Total Protein: 7.3 g/dL (ref 6.5–8.1)

## 2019-01-03 LAB — TYPE AND SCREEN
ABO/RH(D): A POS
Antibody Screen: NEGATIVE

## 2019-01-03 LAB — PROTIME-INR
INR: 1.1 (ref 0.8–1.2)
Prothrombin Time: 13.6 seconds (ref 11.4–15.2)

## 2019-01-03 MED ORDER — LORAZEPAM 1 MG PO TABS
1.0000 mg | ORAL_TABLET | Freq: Once | ORAL | Status: AC
  Administered 2019-01-03: 09:00:00 1 mg via ORAL
  Filled 2019-01-03: qty 1

## 2019-01-03 MED ORDER — POLYETHYLENE GLYCOL 3350 17 G PO PACK: 17.0000 g | PACK | Freq: Every day | ORAL | 0 refills | Status: DC

## 2019-01-03 NOTE — ED Notes (Addendum)
Patient transported to X-ray 

## 2019-01-03 NOTE — ED Provider Notes (Signed)
[facility] Emergency Department Provider Note       Time seen: ----------------------------------------- 8:40 AM on 01/03/2019 -----------------------------------------   I have reviewed the triage vital signs and the nursing notes.  HISTORY   Chief Complaint GI Bleeding    HPI Janice Perkins is a 85 y.o. female with a history of asthma, atrial fibrillation, bronchitis, breast cancer, COPD, hypertension, shingles who presents to the ED for dental pain with possibly passing blood in her stools and nausea.  She is only been on Eliquis for the last week, she also currently takes iron.  She is currently taking Eliquis for atrial fibrillation.  She denies any pain.  She states she has been anxious, did not sleep well last night.  She has not taken her anxiety her blood pressure medicines this morning.  Past Medical History:  Diagnosis Date  . Asthma   . Atrial fibrillation ([place])   . Breast cancer ([place]) 2011  . Bronchitis 04/2015  . Cancer [facility]) 2011   breast  . COPD (chronic obstructive pulmonary disease) ([place])   . Hypertension   . Shingles    10/2015    Patient Active Problem List   Diagnosis Date Noted  . Hypertensive encephalopathy 12/11/2018  . Hypoxia 05/30/2018  . Leukocytosis 04/05/2018  . Pneumonia 11/15/2017  . Pleural effusion 05/16/2017  . AF (paroxysmal atrial fibrillation) ([place]) 05/16/2017  . Breast cancer ([place]) 05/16/2017  . Dependence on nocturnal oxygen therapy 05/16/2017  . HTN (hypertension) 05/16/2017  . Generalized weakness 03/07/2017  . Hyponatremia 03/07/2017  . Dehydration 03/07/2017  . UTI (urinary tract infection) 03/07/2017  . HCAP (healthcare-associated pneumonia) 01/19/2017  . AKI (acute kidney injury) ([place]) 12/18/2016  . Primary cancer of upper outer quadrant of right female breast ([place]) 04/07/2016  . Lumbar radiculopathy 03/23/2016  . Spinal stenosis, lumbar region, with neurogenic claudication 03/23/2016  . DDD  (degenerative disc disease), lumbar 01/14/2015  . Lumbosacral facet joint syndrome 01/14/2015  . Sacroiliac joint dysfunction 01/14/2015  . Greater trochanteric bursitis 01/14/2015  . Bilateral occipital neuralgia 01/14/2015    Past Surgical History:  Procedure Laterality Date  . BREAST EXCISIONAL BIOPSY Right 11/29/13   two areas FAT NECROSIS  . BREAST EXCISIONAL BIOPSY Right 10/30/2009   lumpectomy - radiation  . COLONOSCOPY WITH PROPOFOL N/A 06/10/2018   Procedure: COLONOSCOPY WITH PROPOFOL;  Surgeon: Manya Silvas, MD;  Location: [facility] ENDOSCOPY;  Service: Endoscopy;  Laterality: N/A;  . ESOPHAGOGASTRODUODENOSCOPY (EGD) WITH PROPOFOL N/A 06/10/2018   Procedure: ESOPHAGOGASTRODUODENOSCOPY (EGD) WITH PROPOFOL;  Surgeon: Manya Silvas, MD;  Location: [facility] ENDOSCOPY;  Service: Endoscopy;  Laterality: N/A;  . NASAL SINUS SURGERY      Allergies Diphenhydramine hcl; Penicillins; Captopril; Enalapril maleate; Tape; Terfenadine; Augmentin [amoxicillin-pot clavulanate]; and Biaxin [clarithromycin]  Social History Social History   Tobacco Use  . Smoking status: Never Smoker  . Smokeless tobacco: Never Used  Substance Use Topics  . Alcohol use: No    Alcohol/week: 0.0 standard drinks  . Drug use: No   Review of Systems Constitutional: Negative for fever. Cardiovascular: Negative for chest pain. Respiratory: Negative for shortness of breath. Gastrointestinal: Positive for abdominal pain, positive for rectal bleeding Musculoskeletal: Negative for back pain. Skin: Negative for rash. Neurological: Negative for headaches, focal weakness or numbness.  All systems negative/normal/unremarkable except as stated in the HPI  ____________________________________________   PHYSICAL EXAM:  VITAL SIGNS: ED Triage Vitals [01/03/19 0809]  Enc Vitals Group     BP (!) 168/76  Pulse Rate 69     Resp 17     Temp 97.9 F (36.6 C)     Temp Source Oral     SpO2 97 %     Weight 153  lb (69.4 kg)     Height 5' (1.524 m)     Head Circumference      Peak Flow      Pain Score 0     Pain Loc      Pain Edu?      Excl. in [place]?    Constitutional: Alert and oriented. Well appearing and in no distress. Eyes: Conjunctivae are normal. Normal extraocular movements. ENT      Head: Normocephalic and atraumatic.      Nose: No congestion/rhinnorhea.      Mouth/Throat: Mucous membranes are moist.      Neck: No stridor. Cardiovascular: Irregularly irregular rhythm. No murmurs, rubs, or gallops. Respiratory: Normal respiratory effort without tachypnea nor retractions. Breath sounds are clear and equal bilaterally. No wheezes/rales/rhonchi. Gastrointestinal: Soft and nontender. Normal bowel sounds Rectal: Nontender, no stool, no blood, heme-negative Musculoskeletal: Nontender with normal range of motion in extremities. No lower extremity tenderness nor edema. Neurologic:  Normal speech and language. No gross focal neurologic deficits are appreciated.  Skin:  Skin is warm, dry and intact. No rash noted. Psychiatric: Mood and affect are normal. Speech and behavior are normal.  ____________________________________________  ED COURSE:  As part of my medical decision making, I reviewed the following data within the [place] History obtained from family if available, nursing notes, old chart and ekg, as well as notes from prior ED visits. Patient presented for rectal bleeding, we will assess with labs and imaging as indicated at this time.   Procedures  Janice Perkins was evaluated in Emergency Department on 01/03/2019 for the symptoms described in the history of present illness. She was evaluated in the context of the global COVID-19 pandemic, which necessitated consideration that the patient might be at risk for infection with the SARS-CoV-2 virus that causes COVID-19. Institutional protocols and algorithms that pertain to the evaluation of patients at risk for COVID-19  are in a state of rapid change based on information released by regulatory bodies including the CDC and federal and state organizations. These policies and algorithms were followed during the patient's care in the ED.  ____________________________________________   LABS (pertinent positives/negatives)  Labs Reviewed  COMPREHENSIVE METABOLIC PANEL - Abnormal; Notable for the following components:      Result Value   Sodium 133 (*)    Chloride 97 (*)    Glucose, Bld 117 (*)    GFR calc non Af Amer 57 (*)    All other components within normal limits  CBC  PROTIME-INR  POC OCCULT BLOOD, ED  TYPE AND SCREEN    RADIOLOGY Images were viewed by me  Abdomen 2 view IMPRESSION: Moderate stool in the colon.  Normal bowel gas pattern. ____________________________________________   DIFFERENTIAL DIAGNOSIS   Gastrointestinal bleeding, coagulopathy, anemia, diverticulosis, colitis  FINAL ASSESSMENT AND PLAN  Constipation   Plan: The patient had presented for gastrointestinal bleeding. Patient's labs were reassuring. Patient's imaging revealed constipation but no other acute process.  Clinically she is anxious and constipated.  I will advise MiraLAX and continue her medications as directed.  Rectally there is no blood and she was heme-negative.   Laurence Aly, MD    Note: This note was generated in part or whole with voice recognition software.  Voice recognition is usually quite accurate but there are transcription errors that can and very often do occur. I apologize for any typographical errors that were not detected and corrected.     Earleen Newport, MD 01/03/19 [phone]

## 2019-01-03 NOTE — ED Notes (Signed)
ED Provider williamas at bedside.

## 2019-01-03 NOTE — ED Notes (Signed)
EDP Williams at bedside to performed POC occult blood test.

## 2019-01-03 NOTE — ED Triage Notes (Signed)
Pt states she is on eliquis for a-fib and in the past 2 days having abd pain with blood in her stools and nausea.

## 2019-01-24 ENCOUNTER — Other Ambulatory Visit: Payer: Self-pay

## 2019-01-24 ENCOUNTER — Ambulatory Visit
Admission: RE | Admit: 2019-01-24 | Discharge: 2019-01-24 | Disposition: A | Discharge status: Home / Self Care | Payer: Medicare Other | Source: Ambulatory Visit | Attending: Oncology | Admitting: Oncology

## 2019-01-24 DIAGNOSIS — Z1231 Encounter for screening mammogram for malignant neoplasm of breast: Secondary | ICD-10-CM | POA: Diagnosis not present

## 2019-01-24 DIAGNOSIS — C50411 Malignant neoplasm of upper-outer quadrant of right female breast: Secondary | ICD-10-CM

## 2019-01-24 DIAGNOSIS — Z853 Personal history of malignant neoplasm of breast: Secondary | ICD-10-CM | POA: Insufficient documentation

## 2019-01-26 ENCOUNTER — Other Ambulatory Visit: Payer: Self-pay | Admitting: Unknown Physician Specialty

## 2019-01-26 DIAGNOSIS — K7469 Other cirrhosis of liver: Secondary | ICD-10-CM

## 2019-01-27 ENCOUNTER — Telehealth: Payer: Self-pay | Admitting: Oncology

## 2019-01-29 NOTE — Progress Notes (Signed)
[facility]  [phone] [phone]  ID: Janice Perkins OB: May 08, 1933  MR#: [id number]  AQT#:[id number]  Patient Care Team: [name], MD as PCP - General (Internal Medicine)  I connected with Janice Perkins on 01/31/19 at  2:00 PM EDT by telephone visit and verified that I am speaking with the correct person using two identifiers.   I discussed the limitations, risks, security and privacy concerns of performing an evaluation and management service by telemedicine and the availability of in-person appointments. I also discussed with the patient that there may be a patient responsible charge related to this service. The patient expressed understanding and agreed to proceed.   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  Leukocytosis.  INTERVAL HISTORY: Patient agreed to routine 72-monthevaluation via telephone.  She has multiple medical complaints that are chronic and unchanged.  She has chronic weakness and fatigue.  She has a chronic cough and shortness of breath.  She has no neurologic complaints.  She denies any recent fevers or illnesses.  She has a good appetite and denies weight loss.  She denies any chest pain or hemoptysis.  She denies any nausea, vomiting, constipation , or diarrhea. She has no urinary complaints.  Patient otherwise feels well and offers no further specific complaints today.  REVIEW OF SYSTEMS:   Review of Systems  Constitutional: Positive for malaise/fatigue. Negative for fever and weight loss.  Respiratory: Positive for cough and shortness of breath.   Cardiovascular: Negative.  Negative for chest pain and leg swelling.  Gastrointestinal: Negative.  Negative for abdominal pain, blood in stool and  melena.  Genitourinary: Negative.  Negative for dysuria.  Musculoskeletal: Positive for back pain and joint pain.  Skin: Negative.  Negative for rash.  Neurological: Positive for weakness. Negative for sensory change, focal weakness and headaches.  Psychiatric/Behavioral: Negative.  The patient is not nervous/anxious.     As per HPI. Otherwise, a complete review of systems is negative.  PAST MEDICAL HISTORY: Past Medical History:  Diagnosis Date  . Asthma   . Atrial fibrillation ([place]   . [place]  . Bronchitis 04/2015  . Cancer ([facility] 2011   breast  . COPD [place] disease) ([place]   . Hypertension   . Shingles    10/2015    PAST SURGICAL HISTORY: Past Surgical History:  Procedure Laterality Date  . BREAST EXCISIONAL BIOPSY Right 11/29/13   two areas FAT NECROSIS  . BREAST EXCISIONAL BIOPSY Right 10/30/2009   lumpectomy - radiation  . COLONOSCOPY WITH PROPOFOL N/A 06/10/2018   Procedure: COLONOSCOPY WITH PROPOFOL;  Surgeon: EManya Silvas MD;  [facility];  Service: Endoscopy;  Laterality: N/A;  . ESOPHAGOGASTRODUODENOSCOPY (EGD) WITH PROPOFOL N/A 06/10/2018   Procedure: ESOPHAGOGASTRODUODENOSCOPY (EGD) WITH PROPOFOL;  Surgeon: EManya Silvas MD;  [facility];  Service: Endoscopy;  Laterality: N/A;  . NASAL SINUS SURGERY      FAMILY HISTORY Family History  Problem Relation Age of Onset  . Hypertension Mother   . Heart disease Mother   . Cancer Mother   . Stroke Father   . Hypertension Father   . Breast cancer Sister        ADVANCED DIRECTIVES:    HEALTH MAINTENANCE: Social History   Tobacco Use  .  Smoking status: Never Smoker  . Smokeless tobacco: Never Used  Substance Use Topics  . Alcohol use: No    Alcohol/week: 0.0 standard drinks  . Drug use: No     Colonoscopy:  PAP:  Bone density:  Lipid panel:  Allergies  Allergen Reactions  . Diphenhydramine Hcl Rash  . Penicillins  Hives    .Has patient had a PCN reaction causing immediate rash, facial/tongue/throat swelling, SOB or lightheadedness with hypotension: Unknown Has patient had a PCN reaction causing severe rash involving mucus membranes or skin necrosis: Unknown Has patient had a PCN reaction that required hospitalization: Unknown Has patient had a PCN reaction occurring within the last 10 years: Unknown If all of the above answers are "NO", then may proceed with Cephalosporin use.   . Captopril Other (See Comments)  . Enalapril Maleate Other (See Comments)    Other reaction(s): Unknown  . Tape Itching  . Terfenadine Other (See Comments)  . Augmentin [Amoxicillin-Pot Clavulanate] Rash    Has patient had a PCN reaction causing immediate rash, facial/tongue/throat swelling, SOB or lightheadedness with hypotension: Unknown Has patient had a PCN reaction causing severe rash involving mucus membranes or skin necrosis: Unknown Has patient had a PCN reaction that required hospitalization: Unknown Has patient had a PCN reaction occurring within the last 10 years: Unknown If all of the above answers are "NO", then may proceed with Cephalosporin use.  . Biaxin [Clarithromycin] Rash, Other (See Comments) and Hives    Other reaction(s): Unknown    Current Outpatient Medications  Medication Sig Dispense Refill  . albuterol (ACCUNEB) 1.25 MG/3ML nebulizer solution Inhale 3 mLs into the lungs every 6 (six) hours as needed for wheezing.     Marland Kitchen albuterol (PROVENTIL HFA;VENTOLIN HFA) 108 (90 Base) MCG/ACT inhaler Inhale 2 puffs into the lungs every 6 (six) hours as needed for wheezing or shortness of breath.     . ALPRAZolam (XANAX) 0.5 MG tablet Take 0.5 mg by mouth daily as needed for anxiety or sleep.    Marland Kitchen apixaban (ELIQUIS) 5 MG TABS tablet Take 1 tablet (5 mg total) by mouth 2 (two) times daily. 60 tablet 0  . atorvastatin (LIPITOR) 20 MG tablet Take 20 mg by mouth at bedtime.     . busPIRone (BUSPAR) 10 MG tablet  Take 10 mg by mouth 2 (two) times daily.    . cholecalciferol (VITAMIN D) 1000 units tablet Take 1,000 Units by mouth daily.    Marland Kitchen diltiazem (DILACOR XR) 120 MG 24 hr capsule Take 120 mg by mouth daily.    Marland Kitchen escitalopram (LEXAPRO) 5 MG tablet Take 5 mg by mouth daily.    Marland Kitchen esomeprazole (NEXIUM) 40 MG capsule Take 40 mg by mouth 2 (two) times daily before a meal.     . ferrous sulfate 325 (65 FE) MG tablet Take 325 mg by mouth daily with breakfast.    . Fluticasone-Salmeterol (ADVAIR) 250-50 MCG/DOSE AEPB Inhale 1 puff into the lungs 2 (two) times daily.    . hydrALAZINE (APRESOLINE) 10 MG tablet Take 1 tablet (10 mg total) by mouth 3 (three) times daily. 90 tablet 0  . HYDROcodone-acetaminophen (NORCO/VICODIN) 5-325 MG tablet Take 1 tablet by mouth 2 (two) times daily as needed for moderate pain or severe pain.     Marland Kitchen levothyroxine (SYNTHROID, LEVOTHROID) 50 MCG tablet Take 50 mcg by mouth daily before breakfast.    . loratadine (CLARITIN) 10 MG tablet Take 10 mg by mouth daily.    Marland Kitchen losartan (COZAAR)  50 MG tablet Take 2 tablets (100 mg total) by mouth at bedtime.    . magnesium oxide (MAG-OX) 400 MG tablet Take 400 mg by mouth 2 (two) times daily.    . meclizine (ANTIVERT) 25 MG tablet Take 1 tablet (25 mg total) by mouth 2 (two) times daily as needed for dizziness. 6 tablet 0  . mometasone-formoterol (DULERA) 100-5 MCG/ACT AERO Inhale 2 puffs into the lungs 2 (two) times daily.    . polyethylene glycol (MIRALAX / GLYCOLAX) 17 g packet Take 17 g by mouth daily. 14 each 0   No current facility-administered medications for this visit.     OBJECTIVE: There were no vitals filed for this visit.   There is no height or weight on file to calculate BMI.    ECOG FS:1 - Symptomatic but completely ambulatory   LAB RESULTS:  Lab Results  Component Value Date   NA 133 (L) 01/03/2019   K 4.2 01/03/2019   CL 97 (L) 01/03/2019   CO2 28 01/03/2019   GLUCOSE 117 (H) 01/03/2019   BUN 11 01/03/2019    CREATININE 0.92 01/03/2019   CALCIUM 9.7 01/03/2019   PROT 7.3 01/03/2019   ALBUMIN 4.1 01/03/2019   AST 26 01/03/2019   ALT 18 01/03/2019   ALKPHOS 53 01/03/2019   BILITOT 1.0 01/03/2019   GFRNONAA 57 (L) 01/03/2019   GFRAA >60 01/03/2019    Lab Results  Component Value Date   WBC 5.8 01/03/2019   NEUTROABS 3.6 12/28/2018   HGB 13.3 01/03/2019   HCT 38.5 01/03/2019   MCV 90.6 01/03/2019   PLT 209 01/03/2019     STUDIES: Dg Abd 2 Views  Result Date: 01/03/2019 CLINICAL DATA:  Distended abdomen, gas pain. EXAM: ABDOMEN - 2 VIEW COMPARISON:  CT abdomen pelvis 05/11/2018 FINDINGS: Nonobstructive bowel gas pattern. Moderate stool throughout the colon. Negative for free air. Lumbar scoliosis and degenerative changes. No acute skeletal abnormality. IMPRESSION: Moderate stool in the colon.  Normal bowel gas pattern. Electronically Signed   By: Franchot Gallo M.D.   On: 01/03/2019 09:47   Mm 3d Screen Breast Bilateral  Result Date: 01/24/2019 CLINICAL DATA:  Screening. EXAM: DIGITAL SCREENING BILATERAL MAMMOGRAM WITH TOMO AND CAD COMPARISON:  Previous exam(s). ACR Breast Density Category b: There are scattered areas of fibroglandular density. FINDINGS: There are no findings suspicious for malignancy. Images were processed with CAD. IMPRESSION: No mammographic evidence of malignancy. A result letter of this screening mammogram will be mailed directly to the patient. RECOMMENDATION: Screening mammogram in one year. (Code:SM-B-01Y) BI-RADS CATEGORY  1: Negative. Electronically Signed   By: Marin Olp M.D.   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  Leukocytosis.   PLAN:    1. 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  Her most recent mammogram on January 24, 2019 was  reported as BI-RADS 1, repeat in June 2021.  Return to clinic in 1 year for routine evaluation at which point patient will be 10 years removed from her diagnosis and likely can be discharged from clinic.  2.  Osteopenia: Patient's most recent bone mineral density on Jan 15, 2015 reported T score of -1.1.  Continue follow-up and monitoring by her primary care physician.   3.  Leukocytosis: Resolved.  CT scan results from  March 29, 2018 reviewed independently with no obvious evidence of infection or pneumonia.   4.  Lymphedema: Patient was given a referral to lymphedema clinic.   I provided 25 minutes of non face-to-face telephone visit time during this encounter, and > 50% was spent counseling as documented under my assessment & plan.    Patient expressed understanding and was in agreement with this plan. She also understands that She can call clinic at any time with any questions, concerns, or complaints.    Lloyd Huger, MD   01/31/2019 6:54 AM

## 2019-01-30 ENCOUNTER — Encounter: Payer: Self-pay | Admitting: Oncology

## 2019-01-30 ENCOUNTER — Other Ambulatory Visit: Payer: Self-pay

## 2019-01-30 ENCOUNTER — Inpatient Hospital Stay: Payer: Medicare Other | Attending: Oncology | Admitting: Oncology

## 2019-01-30 DIAGNOSIS — I10 Essential (primary) hypertension: Secondary | ICD-10-CM

## 2019-01-30 DIAGNOSIS — I890 Lymphedema, not elsewhere classified: Secondary | ICD-10-CM

## 2019-01-30 DIAGNOSIS — Z170 Estrogen receptor positive status [ER+]: Secondary | ICD-10-CM | POA: Diagnosis not present

## 2019-01-30 DIAGNOSIS — D72829 Elevated white blood cell count, unspecified: Secondary | ICD-10-CM | POA: Diagnosis not present

## 2019-01-30 DIAGNOSIS — C50411 Malignant neoplasm of upper-outer quadrant of right female breast: Secondary | ICD-10-CM | POA: Diagnosis not present

## 2019-01-30 DIAGNOSIS — M8580 Other specified disorders of bone density and structure, unspecified site: Secondary | ICD-10-CM

## 2019-01-30 DIAGNOSIS — I4891 Unspecified atrial fibrillation: Secondary | ICD-10-CM

## 2019-01-30 DIAGNOSIS — Z79899 Other long term (current) drug therapy: Secondary | ICD-10-CM

## 2019-01-30 NOTE — Progress Notes (Signed)
Patient reports swelling and fluid in right arm.

## 2019-02-01 ENCOUNTER — Other Ambulatory Visit: Payer: Self-pay

## 2019-02-01 ENCOUNTER — Ambulatory Visit
Admission: RE | Admit: 2019-02-01 | Discharge: 2019-02-01 | Disposition: A | Discharge status: Home / Self Care | Payer: Medicare Other | Source: Ambulatory Visit | Attending: Unknown Physician Specialty | Admitting: Unknown Physician Specialty

## 2019-02-01 DIAGNOSIS — K7469 Other cirrhosis of liver: Secondary | ICD-10-CM | POA: Insufficient documentation

## 2019-02-06 ENCOUNTER — Other Ambulatory Visit: Payer: Self-pay

## 2019-02-06 ENCOUNTER — Encounter: Payer: Self-pay | Admitting: Occupational Therapy

## 2019-02-06 ENCOUNTER — Ambulatory Visit: Payer: Medicare Other | Attending: Oncology | Admitting: Occupational Therapy

## 2019-02-06 DIAGNOSIS — L905 Scar conditions and fibrosis of skin: Secondary | ICD-10-CM | POA: Diagnosis present

## 2019-02-06 DIAGNOSIS — I972 Postmastectomy lymphedema syndrome: Secondary | ICD-10-CM | POA: Diagnosis present

## 2019-02-06 NOTE — Patient Instructions (Signed)
Pt can do some fibrotic tech and massage to R forearm Tubigrip soft form hand to elbow one for daytime and night time anther one  Komprex foam provided to change from in am on dorsal forearm, to radial for afternoon and night time volar forearm under tubigrip

## 2019-02-06 NOTE — Therapy (Signed)
[facility] [address] [place], [address] Phone: [phone]   Fax:  [phone]  Occupational Therapy Evaluation  Patient Details  Name: Janice Perkins MRN: [id number] Date of Birth: [birth year] Referring Provider (OT): Dr Grayland Ormond   Encounter Date: 02/06/2019  OT End of Session - 02/06/19 1519    Visit Number  1    Number of Visits  6    Date for OT Re-Evaluation  03/20/19    OT Start Time  1005    OT Stop Time  1058    OT Time Calculation (min)  53 min    Activity Tolerance  Patient tolerated treatment well    Behavior During Therapy  [facility] for tasks assessed/performed       Past Medical History:  Diagnosis Date  . Asthma   . Atrial fibrillation ([place])   . Breast cancer ([place]) 2011  . Bronchitis 04/2015  . Cancer [facility]) 2011   breast  . COPD (chronic obstructive pulmonary disease) ([place])   . Hypertension   . Shingles    10/2015    Past Surgical History:  Procedure Laterality Date  . BREAST EXCISIONAL BIOPSY Right 11/29/13   two areas FAT NECROSIS  . BREAST EXCISIONAL BIOPSY Right 10/30/2009   lumpectomy - radiation  . COLONOSCOPY WITH PROPOFOL N/A 06/10/2018   Procedure: COLONOSCOPY WITH PROPOFOL;  Surgeon: Manya Silvas, MD;  Location: [facility]OSCOPY;  Service: Endoscopy;  Laterality: N/A;  . ESOPHAGOGASTRODUODENOSCOPY (EGD) WITH PROPOFOL N/A 06/10/2018   Procedure: ESOPHAGOGASTRODUODENOSCOPY (EGD) WITH PROPOFOL;  Surgeon: Manya Silvas, MD;  Location: [facility];  Service: Endoscopy;  Laterality: N/A;  . NASAL SINUS SURGERY      There were no vitals filed for this visit.  Subjective Assessment - 02/06/19 1505    Subjective   My arm started swelling around Christmas last year - one of my family members ask me about it - she is a Marine scientist- I did get 2 blisters on my forearm since then - it just feel hard and tight - some days better than other days    Pertinent History  SHe had  lumpectomy 10/30/2009 - unknown amount of ln removed - radiation - notice the lymphedema in her R arm Christmas 2019 - seen Dr Sabra Heck in Jan 2020 because of cellulitis episode - refer to this OT last week for lymphedema of R UE    Repetition  Increases Symptoms    Patient Stated Goals  I want my arm to decrease in size - the same as my other arm    Currently in Pain?  No/denies        [facility] OT Assessment - 02/06/19 0001      Assessment   Medical Diagnosis  R UE lymphedema     Referring Provider (OT)  Dr Grayland Ormond    Onset Date/Surgical Date  08/10/18    Hand Dominance  Right      Balance Screen   Has the patient fallen in the past 6 months  No      Home  Environment   Lives With  Spouse      Prior Function   Vocation  Retired    Leisure  Loves to [employer], some house work        LYMPHEDEMA/ONCOLOGY QUESTIONNAIRE - 02/06/19 1021      Surgeries   Lumpectomy Date  10/30/09    Number Lymph Nodes Removed  --   unknown  Treatment   Past Radiation Treatment  Yes    Date  --   2011   Past Hormone Therapy  Yes    Drug Name  Letrozole       What other symptoms do you have   Are you Having Heaviness or Tightness  Yes    Are you having Pain  Yes    Are you having pitting edema  Yes    Body Site  --   R forearm to elbow   Do you have infections  Yes    Comments  --   Jan 2020 had cellulitis   Is there Decreased scar mobility  Yes   R lateral breast   Other Symptoms  fibrosis of forearm       Lymphedema Stage   Stage  STAGE 2 SPONTANEOUSLY IRREVERSIBLE      Right Upper Extremity Lymphedema   15 cm Proximal to Olecranon Process  32.8 cm    10 cm Proximal to Olecranon Process  27.6 cm    Olecranon Process  27 cm    15 cm Proximal to Ulnar Styloid Process  26 cm    10 cm Proximal to Ulnar Styloid Process  23.4 cm    Just Proximal to Ulnar Styloid Process  17.8 cm    Across Hand at [employer]  19.3 cm    At [place] of 2nd Digit  6 cm    At [facility] of Thumb  6 cm       Left Upper Extremity Lymphedema   15 cm Proximal to Olecranon Process  32 cm    10 cm Proximal to Olecranon Process  27 cm    Olecranon Process  24 cm    15 cm Proximal to Ulnar Styloid Process  23.3 cm    10 cm Proximal to Ulnar Styloid Process  21 cm    Just Proximal to Ulnar Styloid Process  15.8 cm    Across Hand at [employer]  19.4 cm    At [place] of 2nd Digit  6 cm    At [facility] of Thumb  6 cm         Done some fibrotic techniques on R forearm with success on dorsal more than volar     Pt can do some fibrotic tech and massage to R forearm - ed on doing Fitted with Tubigrip soft from hand to elbow-  one for daytime and night time anther one  Komprex foam provided to change from in am on dorsal forearm, to radial in afternoon and volar night time  Showed some great compression and indention just after having it on for 5 min on volar forearm  Pt to return in 3 days          OT Education - 02/06/19 1518    Education Details  findings of eval, education on lymphedema and POC- homeprogram    Person(s) Educated  Patient    Methods  Explanation;Demonstration;Handout;Tactile cues    Comprehension  Verbalized understanding;Returned demonstration       OT Short Term Goals - 02/06/19 1527      OT SHORT TERM GOAL #1   Title  Pt to be independent in doing manual techniques, compression with komprex to decrease circumference and fibrosis to get fit for compression sleeve    Baseline  no knowledge    Time  3    Period  Weeks    Status  New    Target Date  02/27/19        OT Long Term Goals - 02/06/19 1528      OT LONG TERM GOAL #1   Title  Pt R UE circumference decrease with more than 2 cm for forearm to elbow to be fitted for compression sleeve    Baseline  R Circumference increase by 2 cm at wrist , 2.7 forearm and 3 cm at elbow - upper arm about the same    Time  4    Period  Weeks    Status  New    Target Date  03/06/19      OT LONG TERM GOAL #2   Title  Pt to  be independent in wearing of compression sleeve or sleeves to maintain her circumference/lymphedema to decrease episodes of cellulitis    Baseline  had episode of cellulitis in Jan 2020 and twice since then blisters on forearm - no compression    Time  6    Period  Weeks    Status  New    Target Date  03/20/19            Plan - 02/06/19 1520    Clinical Impression Statement  Pt present at OT eval with R UE stage 2 lymphedema and scar tissue on R lateral breast - tender and hard - lymphedema mostly in wrist to elbow - with fibrosis of forearm - tight and hard - pt is R hand dominant - but R wrist increase by 2 cm , forearm by 2.7 cm and elbow 3 cm compare to L UE - pt report she had symptoms for about 6 months after doing a lot of cooking and lifting heavy pots and pans - had in January episode of cellulitis - and since then 2 times some blisters but no infection - pt can benefit form OT services for CDT to decrease lymphedema and establish homeprogram to maintain R UE lymphedema and decrease risk for cellulitis    OT Occupational Profile and History  Problem Focused Assessment - Including review of records relating to presenting problem    Occupational performance deficits (Please refer to evaluation for details):  ADL's;IADL's;Play;Leisure    Body Structure / Function / Physical Skills  UE functional use;Scar mobility;Edema    Rehab Potential  Good    Clinical Decision Making  Limited treatment options, no task modification necessary    Comorbidities Affecting Occupational Performance:  May have comorbidities impacting occupational performance    Modification or Assistance to Complete Evaluation   No modification of tasks or assist necessary to complete eval    OT Frequency  1x / week    OT Duration  6 weeks    OT Treatment/Interventions  Self-care/ADL training;Manual lymph drainage;Patient/family education;Scar mobilization;Manual Therapy    Plan  assess circumference of R UE with wearing  of Komprex with tubigrip soft    OT Home Exercise Plan  see pt instruction       Patient will benefit from skilled therapeutic intervention in order to improve the following deficits and impairments:   Body Structure / Function / Physical Skills: UE functional use, Scar mobility, Edema       Visit Diagnosis: 1. Postmastectomy lymphedema syndrome   2. Scar condition and fibrosis of skin       Problem List Patient Active Problem List   Diagnosis Date Noted  . Hypertensive encephalopathy 12/11/2018  . Hypoxia 05/30/2018  . Leukocytosis 04/05/2018  . Pneumonia 11/15/2017  . Pleural effusion 05/16/2017  . AF (  paroxysmal atrial fibrillation) ([place]) 05/16/2017  . Breast cancer ([place]) 05/16/2017  . Dependence on nocturnal oxygen therapy 05/16/2017  . HTN (hypertension) 05/16/2017  . Generalized weakness 03/07/2017  . Hyponatremia 03/07/2017  . Dehydration 03/07/2017  . UTI (urinary tract infection) 03/07/2017  . HCAP (healthcare-associated pneumonia) 01/19/2017  . AKI (acute kidney injury) ([place]) 12/18/2016  . Primary cancer of upper outer quadrant of right female breast ([place]) 04/07/2016  . Lumbar radiculopathy 03/23/2016  . Spinal stenosis, lumbar region, with neurogenic claudication 03/23/2016  . DDD (degenerative disc disease), lumbar 01/14/2015  . Lumbosacral facet joint syndrome 01/14/2015  . Sacroiliac joint dysfunction 01/14/2015  . Greater trochanteric bursitis 01/14/2015  . Bilateral occipital neuralgia 01/14/2015    Rosalyn Gess OTR/L,CLT 02/06/2019, 3:35 PM  [facility] [address] [place], [address] Phone: [phone]   Fax:  [phone]  Name: Janice Perkins MRN: [id number] Date of Birth: February 04, 1933

## 2019-02-08 ENCOUNTER — Encounter: Payer: Self-pay | Admitting: Emergency Medicine

## 2019-02-08 ENCOUNTER — Other Ambulatory Visit: Payer: Self-pay

## 2019-02-08 ENCOUNTER — Emergency Department
Admission: EM | Admit: 2019-02-08 | Discharge: 2019-02-08 | Disposition: A | Discharge status: Home / Self Care | Payer: Medicare Other | Attending: Emergency Medicine | Admitting: Emergency Medicine

## 2019-02-08 DIAGNOSIS — Z79899 Other long term (current) drug therapy: Secondary | ICD-10-CM | POA: Insufficient documentation

## 2019-02-08 DIAGNOSIS — R040 Epistaxis: Secondary | ICD-10-CM | POA: Insufficient documentation

## 2019-02-08 DIAGNOSIS — Z853 Personal history of malignant neoplasm of breast: Secondary | ICD-10-CM | POA: Insufficient documentation

## 2019-02-08 DIAGNOSIS — I10 Essential (primary) hypertension: Secondary | ICD-10-CM | POA: Diagnosis not present

## 2019-02-08 DIAGNOSIS — Z7901 Long term (current) use of anticoagulants: Secondary | ICD-10-CM | POA: Insufficient documentation

## 2019-02-08 DIAGNOSIS — J449 Chronic obstructive pulmonary disease, unspecified: Secondary | ICD-10-CM | POA: Insufficient documentation

## 2019-02-08 MED ORDER — LORAZEPAM 2 MG/ML IJ SOLN
1.0000 mg | Freq: Once | INTRAMUSCULAR | Status: AC
  Administered 2019-02-08: 1 mg via INTRAVENOUS
  Filled 2019-02-08: qty 1

## 2019-02-08 MED ORDER — CLINDAMYCIN HCL 300 MG PO CAPS: 300.0000 mg | ORAL_CAPSULE | Freq: Four times a day (QID) | ORAL | 0 refills | Status: DC

## 2019-02-08 MED ORDER — TRANEXAMIC ACID 1000 MG/10ML IV SOLN
500.0000 mg | Freq: Once | INTRAVENOUS | Status: AC
  Administered 2019-02-08: 17:00:00 500 mg via TOPICAL
  Filled 2019-02-08: qty 10

## 2019-02-08 MED ORDER — OXYMETAZOLINE HCL 0.05 % NA SOLN
1.0000 | Freq: Once | NASAL | Status: AC
  Administered 2019-02-08: 1 via NASAL

## 2019-02-08 NOTE — Discharge Instructions (Addendum)
It is very important that you follow up with Dr. Kathyrn Sheriff to remove the nasal packing. Please take the antibiotics until instructed not to. Please return for any further bleeding. Please seek medical attention for any high fevers, chest pain, shortness of breath, change in behavior, persistent vomiting, bloody stool or any other new or concerning symptoms.

## 2019-02-08 NOTE — ED Provider Notes (Signed)
[facility] Emergency Department Provider Note   ____________________________________________   I have reviewed the triage vital signs and the nursing notes.   HISTORY  Chief Complaint Epistaxis   History limited by: Not Limited   HPI Janice Perkins is a 85 y.o. female who presents to the emergency department today because of concern for nose bleed. The patient states that she noticed a little blood earlier today. Then when she went to blow her nose and started having heavy bleeding. This occurred around 2pm. She states it has been bleeding fairly steadily since then. The patient is on eliquis. She states she has had nosebleeds in the past. Denies any shortness of breath.  Records reviewed. Per medical record review patient has a history of atrial fibrillation.  Past Medical History:  Diagnosis Date  . Asthma   . Atrial fibrillation ([place])   . Breast cancer ([place]) 2011  . Bronchitis 04/2015  . Cancer [facility]) 2011   breast  . COPD (chronic obstructive pulmonary disease) ([place])   . Hypertension   . Shingles    10/2015    Patient Active Problem List   Diagnosis Date Noted  . Hypertensive encephalopathy 12/11/2018  . Hypoxia 05/30/2018  . Leukocytosis 04/05/2018  . Pneumonia 11/15/2017  . Pleural effusion 05/16/2017  . AF (paroxysmal atrial fibrillation) ([place]) 05/16/2017  . Breast cancer ([place]) 05/16/2017  . Dependence on nocturnal oxygen therapy 05/16/2017  . HTN (hypertension) 05/16/2017  . Generalized weakness 03/07/2017  . Hyponatremia 03/07/2017  . Dehydration 03/07/2017  . UTI (urinary tract infection) 03/07/2017  . HCAP (healthcare-associated pneumonia) 01/19/2017  . AKI (acute kidney injury) ([place]) 12/18/2016  . Primary cancer of upper outer quadrant of right female breast ([place]) 04/07/2016  . Lumbar radiculopathy 03/23/2016  . Spinal stenosis, lumbar region, with neurogenic claudication 03/23/2016  . DDD (degenerative disc disease), lumbar  01/14/2015  . Lumbosacral facet joint syndrome 01/14/2015  . Sacroiliac joint dysfunction 01/14/2015  . Greater trochanteric bursitis 01/14/2015  . Bilateral occipital neuralgia 01/14/2015    Past Surgical History:  Procedure Laterality Date  . BREAST EXCISIONAL BIOPSY Right 11/29/13   two areas FAT NECROSIS  . BREAST EXCISIONAL BIOPSY Right 10/30/2009   lumpectomy - radiation  . COLONOSCOPY WITH PROPOFOL N/A 06/10/2018   Procedure: COLONOSCOPY WITH PROPOFOL;  Surgeon: Manya Silvas, MD;  Location[facility] ENDOSCOPY;  Service: Endoscopy;  Laterality: N/A;  . ESOPHAGOGASTRODUODENOSCOPY (EGD) WITH PROPOFOL N/A 06/10/2018   Procedure: ESOPHAGOGASTRODUODENOSCOPY (EGD) WITH PROPOFOL;  Surgeon: Manya Silvas, MD;  Location[facility] ENDOSCOPY;  Service: Endoscopy;  Laterality: N/A;  . NASAL SINUS SURGERY      Prior to Admission medications   Medication Sig Start Date End Date Taking? Authorizing Provider  albuterol (ACCUNEB) 1.25 MG/3ML nebulizer solution Inhale 3 mLs into the lungs every 6 (six) hours as needed for wheezing.     [provider]  albuterol (PROVENTIL HFA;VENTOLIN HFA) 108 (90 Base) MCG/ACT inhaler Inhale 2 puffs into the lungs every 6 (six) hours as needed for wheezing or shortness of breath.     [provider]  ALPRAZolam Duanne Moron) 0.5 MG tablet Take 0.5 mg by mouth daily as needed for anxiety or sleep.    [provider]  apixaban (ELIQUIS) 5 MG TABS tablet Take 1 tablet (5 mg total) by mouth 2 (two) times daily. 12/13/18   Loletha Grayer, MD  atorvastatin (LIPITOR) 20 MG tablet Take 20 mg by mouth at bedtime.     [provider]  busPIRone (  BUSPAR) 10 MG tablet Take 10 mg by mouth 2 (two) times daily.    [provider]  cholecalciferol (VITAMIN D) 1000 units tablet Take 1,000 Units by mouth daily.    [provider]  diltiazem (DILACOR XR) 120 MG 24 hr capsule Take 120 mg by mouth daily.    [provider]   escitalopram (LEXAPRO) 5 MG tablet Take 5 mg by mouth daily.    [provider]  esomeprazole (NEXIUM) 40 MG capsule Take 40 mg by mouth 2 (two) times daily before a meal.     [provider]  ferrous sulfate 325 (65 FE) MG tablet Take 325 mg by mouth daily with breakfast.    [provider]  Fluticasone-Salmeterol (ADVAIR) 250-50 MCG/DOSE AEPB Inhale 1 puff into the lungs 2 (two) times daily.    [provider]  hydrALAZINE (APRESOLINE) 10 MG tablet Take 1 tablet (10 mg total) by mouth 3 (three) times daily. 12/13/18   Loletha Grayer, MD  HYDROcodone-acetaminophen (NORCO/VICODIN) 5-325 MG tablet Take 1 tablet by mouth 2 (two) times daily as needed for moderate pain or severe pain.     [provider]  levothyroxine (SYNTHROID, LEVOTHROID) 50 MCG tablet Take 50 mcg by mouth daily before breakfast.    [provider]  loratadine (CLARITIN) 10 MG tablet Take 10 mg by mouth daily.    [provider]  losartan (COZAAR) 50 MG tablet Take 2 tablets (100 mg total) by mouth at bedtime. 12/13/18   Loletha Grayer, MD  magnesium oxide (MAG-OX) 400 MG tablet Take 400 mg by mouth 2 (two) times daily.    [provider]  meclizine (ANTIVERT) 25 MG tablet Take 1 tablet (25 mg total) by mouth 2 (two) times daily as needed for dizziness. 09/16/18   Merlyn Lot, MD  mometasone-formoterol (DULERA) 100-5 MCG/ACT AERO Inhale 2 puffs into the lungs 2 (two) times daily.    [provider]  polyethylene glycol (MIRALAX / GLYCOLAX) 17 g packet Take 17 g by mouth daily. 01/03/19   Earleen Newport, MD    Allergies Diphenhydramine hcl, Penicillins, Captopril, Enalapril maleate, Tape, Terfenadine, Augmentin [amoxicillin-pot clavulanate], and Biaxin [clarithromycin]  Family History  Problem Relation Age of Onset  . Hypertension Mother   . Heart disease Mother   . Cancer Mother   . Stroke Father   . Hypertension Father   . Breast  cancer Sister     Social History Social History   Tobacco Use  . Smoking status: Never Smoker  . Smokeless tobacco: Never Used  Substance Use Topics  . Alcohol use: No    Alcohol/week: 0.0 standard drinks  . Drug use: No    Review of Systems Constitutional: No fever/chills Eyes: No visual changes. ENT: Positive for nosebleed.  Cardiovascular: Denies chest pain. Respiratory: Denies shortness of breath. Gastrointestinal: No abdominal pain.  No nausea, no vomiting.  No diarrhea.   Genitourinary: Negative for dysuria. Musculoskeletal: Negative for back pain. Skin: Negative for rash. Neurological: Negative for headaches, focal weakness or numbness.  ____________________________________________   PHYSICAL EXAM:  VITAL SIGNS: ED Triage Vitals  Enc Vitals Group     BP 02/08/19 1426 (!) 161/76     Pulse Rate 02/08/19 1426 78     Resp 02/08/19 1426 20     Temp 02/08/19 1426 98.6 F (37 C)     Temp Source 02/08/19 1426 Oral     SpO2 02/08/19 1426 98 %     Weight 02/08/19 1420  154 lb (69.9 kg)     Height 02/08/19 1420 5' (1.524 m)     Head Circumference --      Peak Flow --      Pain Score 02/08/19 1420 0   Constitutional: Alert and oriented.  Eyes: Conjunctivae are normal.  ENT      Head: Normocephalic and atraumatic.      Nose: Active bleeding from left nares.       Mouth/Throat: Mucous membranes are moist.      Neck: No stridor. Hematological/Lymphatic/Immunilogical: No cervical lymphadenopathy. Cardiovascular: Normal rate, regular rhythm.  No murmurs, rubs, or gallops.  Respiratory: Normal respiratory effort without tachypnea nor retractions. Breath sounds are clear and equal bilaterally. No wheezes/rales/rhonchi. Gastrointestinal: Soft and non tender. No rebound. No guarding.  Genitourinary: Deferred Musculoskeletal: Normal range of motion in all extremities.  Neurologic:  Normal speech and language. No gross focal neurologic deficits are appreciated.  Skin:   Skin is warm, dry and intact. No rash noted. Psychiatric: Mood and affect are normal. Speech and behavior are normal. Patient exhibits appropriate insight and judgment.  ____________________________________________    LABS (pertinent positives/negatives)  None  ____________________________________________   EKG  None  ____________________________________________    RADIOLOGY  None  ____________________________________________   PROCEDURES  .Epistaxis Management  Date/Time: 02/08/2019 7:46 PM Performed by: Nance Pear, MD Authorized by: Nance Pear, MD   Consent:    Consent obtained:  Verbal   Consent given by:  Patient Procedure details:    Treatment site:  L anterior   Treatment method:  Nasal balloon   Treatment complexity:  Limited   Treatment episode: initial   Post-procedure details:    Assessment:  Bleeding stopped   Patient tolerance of procedure:  Tolerated well, no immediate complications     ____________________________________________   INITIAL IMPRESSION / ASSESSMENT AND PLAN / ED COURSE  Pertinent labs & imaging results that were available during my care of the patient were reviewed by me and considered in my medical decision making (see chart for details).   Patient presented to the emergency department today because of concern for nose bleed. Patient is on eliquis. Bleeding noted from left nare. After afrin and TXA were attempted control was obtained with nasal balloon. Discussed with patient importance of follow up with ent. Patient without any hypotension or tachycardia to suggest significant blood loss.   ____________________________________________   FINAL CLINICAL IMPRESSION(S) / ED DIAGNOSES  Final diagnoses:  Epistaxis     Note: This dictation was prepared with Dragon dictation. Any transcriptional errors that result from this process are unintentional     Nance Pear, MD 02/08/19 1948

## 2019-02-08 NOTE — TOC Progression Note (Signed)
Transition of Care [facility]) - Progression Note    Patient Details  Name: Janice Perkins MRN: [id number] Date of Birth: [birth year]  Transition of Care [facility]) CM/SW Contact  Marshell Garfinkel, RN Phone Number: 02/08/2019, 5:27 PM  Clinical Narrative:     [facility] is [facility].       Expected Discharge Plan and Services                                                 Social Determinants of Health (SDOH) Interventions    Readmission Risk Interventions No flowsheet data found.

## 2019-02-08 NOTE — ED Notes (Signed)
Nasal packing still actively bleeding out the left nare. MD made aware.

## 2019-02-08 NOTE — ED Notes (Signed)
Pt to the er for active nose bleed. Clamp and pressure not useful in clotting. Pt takes elequis bid. Pt has a hx of nose bleeds. Pt reports bleeding began at 1400. Clot present in the right nare however in the left nare, blood is a continuous flow. Pt has a hx of stroke and HTN. Pt missed her 1500 dose of bp med due to being at hospital. Pt is currently hypertensive. Izzy RN at bedside holding pressure. MD aware.

## 2019-02-08 NOTE — ED Notes (Signed)
Patient AAOx4. Vitals Stable. NAD. Epistaxis under control.

## 2019-02-08 NOTE — ED Notes (Signed)
Patient assisted with tolieting. Patient requested Purewick.

## 2019-02-08 NOTE — ED Triage Notes (Signed)
Discussed with susan PA.  No protocols at this time as bleeding just started about 1 hr ago.

## 2019-02-08 NOTE — ED Triage Notes (Signed)
Nose bleed for little over an hr. Pt on eliquis.  Husband gave her afrin at home prior to EMS.  Pt having continued bleeding in triage. Nose clamp applied.

## 2019-02-09 ENCOUNTER — Ambulatory Visit: Payer: Medicare Other | Admitting: Occupational Therapy

## 2019-02-12 ENCOUNTER — Emergency Department
Admission: EM | Admit: 2019-02-12 | Discharge: 2019-02-12 | Disposition: A | Discharge status: Home / Self Care | Payer: Medicare Other | Attending: Emergency Medicine | Admitting: Emergency Medicine

## 2019-02-12 ENCOUNTER — Other Ambulatory Visit: Payer: Self-pay

## 2019-02-12 DIAGNOSIS — E860 Dehydration: Secondary | ICD-10-CM | POA: Diagnosis not present

## 2019-02-12 DIAGNOSIS — J449 Chronic obstructive pulmonary disease, unspecified: Secondary | ICD-10-CM | POA: Diagnosis not present

## 2019-02-12 DIAGNOSIS — I10 Essential (primary) hypertension: Secondary | ICD-10-CM | POA: Insufficient documentation

## 2019-02-12 DIAGNOSIS — J45909 Unspecified asthma, uncomplicated: Secondary | ICD-10-CM | POA: Diagnosis not present

## 2019-02-12 DIAGNOSIS — R42 Dizziness and giddiness: Secondary | ICD-10-CM

## 2019-02-12 DIAGNOSIS — Z853 Personal history of malignant neoplasm of breast: Secondary | ICD-10-CM | POA: Diagnosis not present

## 2019-02-12 DIAGNOSIS — Z880 Allergy status to penicillin: Secondary | ICD-10-CM | POA: Diagnosis not present

## 2019-02-12 DIAGNOSIS — Z79899 Other long term (current) drug therapy: Secondary | ICD-10-CM | POA: Diagnosis not present

## 2019-02-12 DIAGNOSIS — Z7901 Long term (current) use of anticoagulants: Secondary | ICD-10-CM | POA: Insufficient documentation

## 2019-02-12 LAB — CBC
HCT: 36.4 % (ref 36.0–46.0)
Hemoglobin: 12.5 g/dL (ref 12.0–15.0)
MCH: 31.2 pg (ref 26.0–34.0)
MCHC: 34.3 g/dL (ref 30.0–36.0)
MCV: 90.8 fL (ref 80.0–100.0)
Platelets: 225 10*3/uL (ref 150–400)
RBC: 4.01 MIL/uL (ref 3.87–5.11)
RDW: 13.2 % (ref 11.5–15.5)
WBC: 7.8 10*3/uL (ref 4.0–10.5)
nRBC: 0 % (ref 0.0–0.2)

## 2019-02-12 LAB — URINALYSIS, COMPLETE (UACMP) WITH MICROSCOPIC
Bacteria, UA: NONE SEEN
Bilirubin Urine: NEGATIVE
Glucose, UA: NEGATIVE mg/dL
Hgb urine dipstick: NEGATIVE
Ketones, ur: NEGATIVE mg/dL
Leukocytes,Ua: NEGATIVE
Nitrite: NEGATIVE
Protein, ur: NEGATIVE mg/dL
Specific Gravity, Urine: 1.002 — ABNORMAL LOW (ref 1.005–1.030)
Squamous Epithelial / HPF: NONE SEEN (ref 0–5)
pH: 7 (ref 5.0–8.0)

## 2019-02-12 LAB — PROTIME-INR
INR: 1 (ref 0.8–1.2)
Prothrombin Time: 12.6 seconds (ref 11.4–15.2)

## 2019-02-12 LAB — BASIC METABOLIC PANEL
Anion gap: 10 (ref 5–15)
BUN: 12 mg/dL (ref 8–23)
CO2: 24 mmol/L (ref 22–32)
Calcium: 9.3 mg/dL (ref 8.9–10.3)
Chloride: 97 mmol/L — ABNORMAL LOW (ref 98–111)
Creatinine, Ser: 1.03 mg/dL — ABNORMAL HIGH (ref 0.44–1.00)
GFR calc Af Amer: 58 mL/min — ABNORMAL LOW (ref >60)
GFR calc non Af Amer: 50 mL/min — ABNORMAL LOW (ref >60)
Glucose, Bld: 118 mg/dL — ABNORMAL HIGH (ref 70–99)
Potassium: 3.6 mmol/L (ref 3.5–5.1)
Sodium: 131 mmol/L — ABNORMAL LOW (ref 135–145)

## 2019-02-12 MED ORDER — SODIUM CHLORIDE 0.9 % IV BOLUS
250.0000 mL | Freq: Once | INTRAVENOUS | Status: AC
  Administered 2019-02-12: 250 mL via INTRAVENOUS

## 2019-02-12 MED ORDER — SODIUM CHLORIDE 0.9 % IV BOLUS
500.0000 mL | Freq: Once | INTRAVENOUS | Status: AC
  Administered 2019-02-12: 500 mL via INTRAVENOUS

## 2019-02-12 MED ORDER — MECLIZINE HCL 25 MG PO TABS
12.5000 mg | ORAL_TABLET | Freq: Once | ORAL | Status: AC
  Administered 2019-02-12: 12.5 mg via ORAL
  Filled 2019-02-12: qty 1

## 2019-02-12 NOTE — ED Notes (Signed)
Pt ambulatory to rm 19 and back to room 9 without difficulty or issues.

## 2019-02-12 NOTE — ED Provider Notes (Signed)
[facility] Emergency Department Provider Note   ____________________________________________   First MD Initiated Contact with Patient 02/12/19 [phone]     (approximate)  I have reviewed the triage vital signs and the nursing notes.   HISTORY  Chief Complaint Dizziness    HPI Janice Perkins is a 85 y.o. female here for evaluation of "dizziness"  Patient reports that few days ago she had a nosebleed, this was seen and treated in the ER with a nasal packing.  She was on blood thinner at the time and stopped that on Wednesday.  She is not taking any further blood thinner, followed up with her ENT on Friday removed her packing.  Since leaving the hospital from the nosebleed she reports she just felt a little lightheaded a little dizzy, at times feeling a spinning sensation like her vertigo.  No numbness tingling or weakness.  No facial droop.  No headache.  No trouble using an arm or leg.  Reports has had the same symptoms a couple times in the past also treated with meclizine  Patient reports she is also had a couple "brain scans" for the same in the past and never been told she is had a stroke.  Patient here reports that she thinks she needs to have her blood count and sodium check to make sure things okay and that she is probably dehydrated from not eating and drinking too much the last couple days since she had an uncomfortable nasal packing.  She had no further bleeding.  No black or bloody stools.  She is able to get up and walk okay, but gets dizzy at times specially when she gets up or moves her head she will start to feel a "spinning feeling" no pain or discomfort in the ears.   Past Medical History:  Diagnosis Date  . Asthma   . Atrial fibrillation ([place])   . Breast cancer ([place]) 2011  . Bronchitis 04/2015  . Cancer [facility]) 2011   breast  . COPD (chronic obstructive pulmonary disease) ([place])   . Hypertension   . Shingles    10/2015    Patient Active  Problem List   Diagnosis Date Noted  . Hypertensive encephalopathy 12/11/2018  . Hypoxia 05/30/2018  . Leukocytosis 04/05/2018  . Pneumonia 11/15/2017  . Pleural effusion 05/16/2017  . AF (paroxysmal atrial fibrillation) ([place]) 05/16/2017  . Breast cancer ([place]) 05/16/2017  . Dependence on nocturnal oxygen therapy 05/16/2017  . HTN (hypertension) 05/16/2017  . Generalized weakness 03/07/2017  . Hyponatremia 03/07/2017  . Dehydration 03/07/2017  . UTI (urinary tract infection) 03/07/2017  . HCAP (healthcare-associated pneumonia) 01/19/2017  . AKI (acute kidney injury) ([place]) 12/18/2016  . Primary cancer of upper outer quadrant of right female breast ([place]) 04/07/2016  . Lumbar radiculopathy 03/23/2016  . Spinal stenosis, lumbar region, with neurogenic claudication 03/23/2016  . DDD (degenerative disc disease), lumbar 01/14/2015  . Lumbosacral facet joint syndrome 01/14/2015  . Sacroiliac joint dysfunction 01/14/2015  . Greater trochanteric bursitis 01/14/2015  . Bilateral occipital neuralgia 01/14/2015    Past Surgical History:  Procedure Laterality Date  . BREAST EXCISIONAL BIOPSY Right 11/29/13   two areas FAT NECROSIS  . BREAST EXCISIONAL BIOPSY Right 10/30/2009   lumpectomy - radiation  . COLONOSCOPY WITH PROPOFOL N/A 06/10/2018   Procedure: COLONOSCOPY WITH PROPOFOL;  Surgeon: Manya Silvas, MD;  Location: [facility] ENDOSCOPY;  Service: Endoscopy;  Laterality: N/A;  . ESOPHAGOGASTRODUODENOSCOPY (EGD) WITH PROPOFOL N/A 06/10/2018   Procedure: ESOPHAGOGASTRODUODENOSCOPY (EGD) WITH  PROPOFOL;  Surgeon: Manya Silvas, MD;  Location[facility] ENDOSCOPY;  Service: Endoscopy;  Laterality: N/A;  . NASAL SINUS SURGERY      Prior to Admission medications   Medication Sig Start Date End Date Taking? Authorizing Provider  albuterol (ACCUNEB) 1.25 MG/3ML nebulizer solution Inhale 3 mLs into the lungs every 6 (six) hours as needed for wheezing.     [provider]  albuterol  (PROVENTIL HFA;VENTOLIN HFA) 108 (90 Base) MCG/ACT inhaler Inhale 2 puffs into the lungs every 6 (six) hours as needed for wheezing or shortness of breath.     [provider]  ALPRAZolam Duanne Moron) 0.5 MG tablet Take 0.5 mg by mouth daily as needed for anxiety or sleep.    [provider]  apixaban (ELIQUIS) 5 MG TABS tablet Take 1 tablet (5 mg total) by mouth 2 (two) times daily. 12/13/18   Loletha Grayer, MD  atorvastatin (LIPITOR) 20 MG tablet Take 20 mg by mouth at bedtime.     [provider]  busPIRone (BUSPAR) 10 MG tablet Take 10 mg by mouth 2 (two) times daily.    [provider]  cholecalciferol (VITAMIN D) 1000 units tablet Take 1,000 Units by mouth daily.    [provider]  clindamycin (CLEOCIN) 300 MG capsule Take 1 capsule (300 mg total) by mouth 4 (four) times daily for 10 days. 02/08/19 02/18/19  Nance Pear, MD  diltiazem (DILACOR XR) 120 MG 24 hr capsule Take 120 mg by mouth daily.    [provider]  escitalopram (LEXAPRO) 5 MG tablet Take 5 mg by mouth daily.    [provider]  esomeprazole (NEXIUM) 40 MG capsule Take 40 mg by mouth 2 (two) times daily before a meal.     [provider]  ferrous sulfate 325 (65 FE) MG tablet Take 325 mg by mouth daily with breakfast.    [provider]  Fluticasone-Salmeterol (ADVAIR) 250-50 MCG/DOSE AEPB Inhale 1 puff into the lungs 2 (two) times daily.    [provider]  hydrALAZINE (APRESOLINE) 10 MG tablet Take 1 tablet (10 mg total) by mouth 3 (three) times daily. 12/13/18   Loletha Grayer, MD  HYDROcodone-acetaminophen (NORCO/VICODIN) 5-325 MG tablet Take 1 tablet by mouth 2 (two) times daily as needed for moderate pain or severe pain.     [provider]  levothyroxine (SYNTHROID, LEVOTHROID) 50 MCG tablet Take 50 mcg by mouth daily before breakfast.    [provider]  loratadine (CLARITIN) 10 MG tablet Take 10 mg by mouth daily.     [provider]  losartan (COZAAR) 50 MG tablet Take 2 tablets (100 mg total) by mouth at bedtime. 12/13/18   Loletha Grayer, MD  magnesium oxide (MAG-OX) 400 MG tablet Take 400 mg by mouth 2 (two) times daily.    [provider]  meclizine (ANTIVERT) 25 MG tablet Take 1 tablet (25 mg total) by mouth 2 (two) times daily as needed for dizziness. 09/16/18   Merlyn Lot, MD  mometasone-formoterol (DULERA) 100-5 MCG/ACT AERO Inhale 2 puffs into the lungs 2 (two) times daily.    [provider]  polyethylene glycol (MIRALAX / GLYCOLAX) 17 g packet Take 17 g by mouth daily. 01/03/19   Earleen Newport, MD    Allergies Diphenhydramine hcl, Penicillins, Captopril, Enalapril maleate, Tape, Terfenadine, Augmentin [amoxicillin-pot clavulanate], and Biaxin [clarithromycin]  Family History  Problem Relation Age of Onset  . Hypertension Mother   . Heart disease Mother   .  Cancer Mother   . Stroke Father   . Hypertension Father   . Breast cancer Sister     Social History Social History   Tobacco Use  . Smoking status: Never Smoker  . Smokeless tobacco: Never Used  Substance Use Topics  . Alcohol use: No    Alcohol/week: 0.0 standard drinks  . Drug use: No    Review of Systems Constitutional: No fever/chills no exposure to coronavirus Eyes: No visual changes. ENT: No sore throat.  Discomfort in her nose, this is improving.  No further bleeding. Cardiovascular: Denies chest pain. Respiratory: Denies shortness of breath. Gastrointestinal: No abdominal pain.   Genitourinary: Negative for dysuria. Musculoskeletal: Negative for back pain. Skin: Negative for rash. Neurological: Negative for headaches, areas of focal weakness or numbness.    ____________________________________________   PHYSICAL EXAM:  VITAL SIGNS: ED Triage Vitals [02/12/19 0854]  Enc Vitals Group     BP 135/61     Pulse Rate 89     Resp 18     Temp 98.1 F (36.7 C)      Temp Source Oral     SpO2 99 %     Weight      Height      Head Circumference      Peak Flow      Pain Score 0     Pain Loc      Pain Edu?      Excl. in [place]?     Constitutional: Alert and oriented. Well appearing and in no acute distress. Eyes: Conjunctivae are normal. Head: Atraumatic.  Starts to feel slightly dizzy when she moves her head quickly left or right. Nose: No congestion/rhinnorhea. Mouth/Throat: Mucous membranes are moist. Neck: No stridor.  Cardiovascular: Normal rate, regular rhythm. Grossly normal heart sounds.  Good peripheral circulation. Respiratory: Normal respiratory effort.  No retractions. Lungs CTAB. Gastrointestinal: Soft and nontender. No distention. Musculoskeletal: No lower extremity tenderness nor edema. Neurologic:  Normal speech and language. No gross focal neurologic deficits are appreciated.  There is no pronator drift in either upper extremity or lower extremity.  Normal finger-nose-finger arms bilateral.  Cranial nerve exam is normal.  Normal sensation with 5-5 strength throughout all extremities and across the face. Skin:  Skin is warm, dry and intact. No rash noted. Psychiatric: Mood and affect are normal. Speech and behavior are normal.  ____________________________________________   LABS (all labs ordered are listed, but only abnormal results are displayed)  Labs Reviewed  BASIC METABOLIC PANEL - Abnormal; Notable for the following components:      Result Value   Sodium 131 (*)    Chloride 97 (*)    Glucose, Bld 118 (*)    Creatinine, Ser 1.03 (*)    GFR calc non Af Amer 50 (*)    GFR calc Af Amer 58 (*)    All other components within normal limits  URINALYSIS, COMPLETE (UACMP) WITH MICROSCOPIC - Abnormal; Notable for the following components:   Color, Urine STRAW (*)    APPearance CLEAR (*)    Specific Gravity, Urine 1.002 (*)    All other components within normal limits  CBC  PROTIME-INR  CBG MONITORING, ED    ____________________________________________  EKG  Reviewed entered by me at 910 Heart rate 85 QRS 90 QTc 440 Atrial fibrillation, rate controlled.  No acute frank ischemia denoted.  Patient reports known history of A. fib ____________________________________________  RADIOLOGY  Discussed imaging opportunities including CT of the head to evaluate further for  her dizziness, discussion with shared medical decision making the patient declines this and I think this is reasonable.  She has a very much normal neurologic exam without neurologic complaint, history of vertigo treated with meclizine in the past and also good reason to potentially feel slightly lightheaded as well with suspected some dehydration potential blood loss which we will further evaluate for today with labs.  She has no signs of suggest the need for emergent brain imaging at this time and the patient does not wish for it either. ____________________________________________   PROCEDURES  Procedure(s) performed: None  Procedures  Critical Care performed: No  ____________________________________________   INITIAL IMPRESSION / ASSESSMENT AND PLAN / ED COURSE  Pertinent labs & imaging results that were available during my care of the patient were reviewed by me and considered in my medical decision making (see chart for details).   And dizziness.  Clinical history given seem somewhat like vertigo, however at the same time she is also had decreased oral intake, recent nosebleed and some lightheadedness especially noted with walking.  Possibly multifactorial, no indication for CT imaging which patient declined only discussed the very very low but nonzero risk of potential stroke as a cause which clinically though I doubt.  EKG reassuring without cardiac or pulmonary symptoms.  Will give meclizine, hydrate, and check lab test.     Octavio Graves was evaluated in Emergency Department on 02/12/2019 for the symptoms described  in the history of present illness. She was evaluated in the context of the global COVID-19 pandemic, which necessitated consideration that the patient might be at risk for infection with the SARS-CoV-2 virus that causes COVID-19. Institutional protocols and algorithms that pertain to the evaluation of patients at risk for COVID-19 are in a state of rapid change based on information released by regulatory bodies including the CDC and federal and state organizations. These policies and algorithms were followed during the patient's care in the ED.  ----------------------------------------- 1:27 PM on 02/12/2019 -----------------------------------------  Patient feeling well.  Resting comfortably.  No distress.  She is been able to ambulate back and forth to the bathroom down the [place] and back without difficulty.  She did have a small amount of coughing and coughed up a tiny amount of dried blood like less than 1 mL which she showed me.  Suspect this is likely from her epistaxis from few days ago and  also which she reports.  There is no active bleeding.  No epistaxis.  No blood in the posterior oropharynx.  Patient comfortable with plan for discharge, states she has meclizine at home.  Reviewed plan of care, close follow-up and patient in agreement.  Her blood pressure is elevated but she reports she is also due for her hydralazine dose which she will take at 3 PM.  Return precautions and treatment recommendations and follow-up discussed with the patient who is agreeable with the plan.  ____________________________________________   FINAL CLINICAL IMPRESSION(S) / ED DIAGNOSES  Final diagnoses:  Dehydration  Vertigo        Note:  This document was prepared using Dragon voice recognition software and may include unintentional dictation errors       Delman Kitten, MD 02/12/19 1328

## 2019-02-12 NOTE — ED Triage Notes (Addendum)
Pt states that she was seen Wednesday for a nosebleed, states that she has been having dizziness ever since, pt states that she is concerned that her hgb or sodium could be low, states that she took her vertigo and xanax over night in attempt to help with the dizziness. Pt states that she is supposed to be on blood thinner but refuses due to the multiple complications, states that she has a.fib

## 2019-02-12 NOTE — ED Notes (Signed)
Pt up to the toilet x1 assist without any difficulty.

## 2019-02-12 NOTE — ED Notes (Signed)
Pt up walking around room to the toilet and back to stretcher without need for assistance, states she is still having some dizziness.

## 2019-02-12 NOTE — ED Notes (Signed)
Pt given peanut butter and graham crackers, will attempt to ambulate the pt after she is done eating.

## 2019-02-12 NOTE — ED Notes (Signed)
Pt was here 6/24 with a nose bleed, states she followed up with ENT on Friday and had packing removed, states she has been dizzy ever since and took meclizine and xanax to help with vertigo but it did not seem to help.

## 2019-02-17 ENCOUNTER — Encounter: Payer: Self-pay | Admitting: Emergency Medicine

## 2019-02-17 ENCOUNTER — Other Ambulatory Visit: Payer: Self-pay

## 2019-02-17 ENCOUNTER — Inpatient Hospital Stay
Admission: EM | Admit: 2019-02-17 | Discharge: 2019-02-21 | DRG: 151 | Disposition: A | Discharge status: Home / Self Care | Payer: Medicare Other | Attending: Internal Medicine | Admitting: Internal Medicine

## 2019-02-17 DIAGNOSIS — R040 Epistaxis: Secondary | ICD-10-CM | POA: Diagnosis not present

## 2019-02-17 DIAGNOSIS — Z972 Presence of dental prosthetic device (complete) (partial): Secondary | ICD-10-CM

## 2019-02-17 DIAGNOSIS — Z8249 Family history of ischemic heart disease and other diseases of the circulatory system: Secondary | ICD-10-CM

## 2019-02-17 DIAGNOSIS — I10 Essential (primary) hypertension: Secondary | ICD-10-CM | POA: Diagnosis present

## 2019-02-17 DIAGNOSIS — K921 Melena: Secondary | ICD-10-CM | POA: Diagnosis not present

## 2019-02-17 DIAGNOSIS — Z888 Allergy status to other drugs, medicaments and biological substances status: Secondary | ICD-10-CM

## 2019-02-17 DIAGNOSIS — Z803 Family history of malignant neoplasm of breast: Secondary | ICD-10-CM

## 2019-02-17 DIAGNOSIS — K210 Gastro-esophageal reflux disease with esophagitis: Secondary | ICD-10-CM | POA: Diagnosis present

## 2019-02-17 DIAGNOSIS — K641 Second degree hemorrhoids: Secondary | ICD-10-CM | POA: Diagnosis present

## 2019-02-17 DIAGNOSIS — Z853 Personal history of malignant neoplasm of breast: Secondary | ICD-10-CM

## 2019-02-17 DIAGNOSIS — J439 Emphysema, unspecified: Secondary | ICD-10-CM | POA: Diagnosis present

## 2019-02-17 DIAGNOSIS — Z79890 Hormone replacement therapy (postmenopausal): Secondary | ICD-10-CM

## 2019-02-17 DIAGNOSIS — Z91048 Other nonmedicinal substance allergy status: Secondary | ICD-10-CM

## 2019-02-17 DIAGNOSIS — Z8719 Personal history of other diseases of the digestive system: Secondary | ICD-10-CM

## 2019-02-17 DIAGNOSIS — Z881 Allergy status to other antibiotic agents status: Secondary | ICD-10-CM

## 2019-02-17 DIAGNOSIS — Z1159 Encounter for screening for other viral diseases: Secondary | ICD-10-CM

## 2019-02-17 DIAGNOSIS — Z7901 Long term (current) use of anticoagulants: Secondary | ICD-10-CM

## 2019-02-17 DIAGNOSIS — K5900 Constipation, unspecified: Secondary | ICD-10-CM | POA: Diagnosis present

## 2019-02-17 DIAGNOSIS — K922 Gastrointestinal hemorrhage, unspecified: Secondary | ICD-10-CM | POA: Diagnosis present

## 2019-02-17 DIAGNOSIS — Z87440 Personal history of urinary (tract) infections: Secondary | ICD-10-CM

## 2019-02-17 DIAGNOSIS — Z880 Allergy status to penicillin: Secondary | ICD-10-CM

## 2019-02-17 DIAGNOSIS — D62 Acute posthemorrhagic anemia: Secondary | ICD-10-CM | POA: Diagnosis present

## 2019-02-17 DIAGNOSIS — K269 Duodenal ulcer, unspecified as acute or chronic, without hemorrhage or perforation: Secondary | ICD-10-CM

## 2019-02-17 DIAGNOSIS — I480 Paroxysmal atrial fibrillation: Secondary | ICD-10-CM | POA: Diagnosis present

## 2019-02-17 DIAGNOSIS — Z79899 Other long term (current) drug therapy: Secondary | ICD-10-CM

## 2019-02-17 DIAGNOSIS — Z8701 Personal history of pneumonia (recurrent): Secondary | ICD-10-CM

## 2019-02-17 LAB — URINALYSIS, COMPLETE (UACMP) WITH MICROSCOPIC
Bacteria, UA: NONE SEEN
Bilirubin Urine: NEGATIVE
Glucose, UA: NEGATIVE mg/dL
Hgb urine dipstick: NEGATIVE
Ketones, ur: NEGATIVE mg/dL
Leukocytes,Ua: NEGATIVE
Nitrite: NEGATIVE
Protein, ur: NEGATIVE mg/dL
Specific Gravity, Urine: 1.003 — ABNORMAL LOW (ref 1.005–1.030)
Squamous Epithelial / HPF: NONE SEEN (ref 0–5)
pH: 7 (ref 5.0–8.0)

## 2019-02-17 LAB — BASIC METABOLIC PANEL
Anion gap: 10 (ref 5–15)
BUN: 10 mg/dL (ref 8–23)
CO2: 25 mmol/L (ref 22–32)
Calcium: 9.2 mg/dL (ref 8.9–10.3)
Chloride: 97 mmol/L — ABNORMAL LOW (ref 98–111)
Creatinine, Ser: 0.79 mg/dL (ref 0.44–1.00)
GFR calc Af Amer: >60 mL/min (ref >60)
GFR calc non Af Amer: >60 mL/min (ref >60)
Glucose, Bld: 95 mg/dL (ref 70–99)
Potassium: 3.9 mmol/L (ref 3.5–5.1)
Sodium: 132 mmol/L — ABNORMAL LOW (ref 135–145)

## 2019-02-17 LAB — CBC WITH DIFFERENTIAL/PLATELET
Abs Immature Granulocytes: 0.04 10*3/uL (ref 0.00–0.07)
Basophils Absolute: 0.1 10*3/uL (ref 0.0–0.1)
Basophils Relative: 1 %
Eosinophils Absolute: 0.3 10*3/uL (ref 0.0–0.5)
Eosinophils Relative: 5 %
HCT: 36.4 % (ref 36.0–46.0)
Hemoglobin: 12.3 g/dL (ref 12.0–15.0)
Immature Granulocytes: 1 %
Lymphocytes Relative: 26 %
Lymphs Abs: 1.8 10*3/uL (ref 0.7–4.0)
MCH: 30.8 pg (ref 26.0–34.0)
MCHC: 33.8 g/dL (ref 30.0–36.0)
MCV: 91.2 fL (ref 80.0–100.0)
Monocytes Absolute: 1 10*3/uL (ref 0.1–1.0)
Monocytes Relative: 14 %
Neutro Abs: 3.7 10*3/uL (ref 1.7–7.7)
Neutrophils Relative %: 53 %
Platelets: 279 10*3/uL (ref 150–400)
RBC: 3.99 MIL/uL (ref 3.87–5.11)
RDW: 13.2 % (ref 11.5–15.5)
WBC: 6.9 10*3/uL (ref 4.0–10.5)
nRBC: 0 % (ref 0.0–0.2)

## 2019-02-17 LAB — TYPE AND SCREEN
ABO/RH(D): A POS
Antibody Screen: NEGATIVE

## 2019-02-17 LAB — SARS CORONAVIRUS 2 BY RT PCR (HOSPITAL ORDER, PERFORMED IN ~~LOC~~ HOSPITAL LAB): SARS Coronavirus 2: NEGATIVE

## 2019-02-17 LAB — HEMOGLOBIN AND HEMATOCRIT, BLOOD
HCT: 35 % — ABNORMAL LOW (ref 36.0–46.0)
HCT: 35.2 % — ABNORMAL LOW (ref 36.0–46.0)
Hemoglobin: 12 g/dL (ref 12.0–15.0)
Hemoglobin: 11.7 g/dL — ABNORMAL LOW (ref 12.0–15.0)

## 2019-02-17 LAB — PROTIME-INR
INR: 0.9 (ref 0.8–1.2)
Prothrombin Time: 12.3 s (ref 11.4–15.2)

## 2019-02-17 MED ORDER — BUSPIRONE HCL 10 MG PO TABS
10.0000 mg | ORAL_TABLET | Freq: Two times a day (BID) | ORAL | Status: DC
  Administered 2019-02-17 – 2019-02-21 (×9): 10 mg via ORAL
  Filled 2019-02-17 (×10): qty 1

## 2019-02-17 MED ORDER — ATORVASTATIN CALCIUM 20 MG PO TABS
20.0000 mg | ORAL_TABLET | Freq: Every day | ORAL | Status: DC
  Administered 2019-02-17 – 2019-02-20 (×4): 20 mg via ORAL
  Filled 2019-02-17 (×4): qty 1

## 2019-02-17 MED ORDER — ESCITALOPRAM OXALATE 10 MG PO TABS
5.0000 mg | ORAL_TABLET | Freq: Every day | ORAL | Status: DC
  Administered 2019-02-17 – 2019-02-21 (×5): 5 mg via ORAL
  Filled 2019-02-17 (×5): qty 0.5

## 2019-02-17 MED ORDER — ALBUTEROL SULFATE (2.5 MG/3ML) 0.083% IN NEBU: 2.5000 mg | INHALATION_SOLUTION | Freq: Four times a day (QID) | RESPIRATORY_TRACT | Status: DC | PRN

## 2019-02-17 MED ORDER — PANTOPRAZOLE SODIUM 40 MG IV SOLR
40.0000 mg | Freq: Two times a day (BID) | INTRAVENOUS | Status: DC
  Administered 2019-02-17 – 2019-02-21 (×9): 40 mg via INTRAVENOUS
  Filled 2019-02-17 (×9): qty 40

## 2019-02-17 MED ORDER — MAGNESIUM OXIDE 400 (241.3 MG) MG PO TABS
400.0000 mg | ORAL_TABLET | Freq: Two times a day (BID) | ORAL | Status: DC
  Administered 2019-02-17 – 2019-02-21 (×8): 400 mg via ORAL
  Filled 2019-02-17 (×8): qty 1

## 2019-02-17 MED ORDER — CLINDAMYCIN HCL 150 MG PO CAPS
300.0000 mg | ORAL_CAPSULE | Freq: Four times a day (QID) | ORAL | Status: DC
  Administered 2019-02-17 – 2019-02-21 (×14): 300 mg via ORAL
  Filled 2019-02-17 (×19): qty 2

## 2019-02-17 MED ORDER — HYDRALAZINE HCL 10 MG PO TABS
10.0000 mg | ORAL_TABLET | Freq: Three times a day (TID) | ORAL | Status: DC
  Administered 2019-02-17 – 2019-02-20 (×11): 10 mg via ORAL
  Filled 2019-02-17 (×12): qty 1

## 2019-02-17 MED ORDER — DILTIAZEM HCL ER COATED BEADS 120 MG PO CP24
120.0000 mg | ORAL_CAPSULE | Freq: Every day | ORAL | Status: DC
  Administered 2019-02-17 – 2019-02-21 (×5): 120 mg via ORAL
  Filled 2019-02-17 (×5): qty 1

## 2019-02-17 MED ORDER — LOSARTAN POTASSIUM 50 MG PO TABS
100.0000 mg | ORAL_TABLET | Freq: Every day | ORAL | Status: DC
  Administered 2019-02-17 – 2019-02-20 (×4): 100 mg via ORAL
  Filled 2019-02-17 (×4): qty 2

## 2019-02-17 MED ORDER — HYDROCODONE-ACETAMINOPHEN 5-325 MG PO TABS
1.0000 | ORAL_TABLET | Freq: Two times a day (BID) | ORAL | Status: DC | PRN
  Administered 2019-02-18 – 2019-02-20 (×4): 1 via ORAL
  Filled 2019-02-17 (×4): qty 1

## 2019-02-17 MED ORDER — VITAMIN D3 25 MCG (1000 UNIT) PO TABS
1000.0000 [IU] | ORAL_TABLET | Freq: Every day | ORAL | Status: DC
  Administered 2019-02-17 – 2019-02-21 (×5): 1000 [IU] via ORAL
  Filled 2019-02-17 (×10): qty 1

## 2019-02-17 MED ORDER — ONDANSETRON HCL 4 MG/2ML IJ SOLN: 4.0000 mg | Freq: Four times a day (QID) | INTRAMUSCULAR | Status: DC | PRN

## 2019-02-17 MED ORDER — ACETAMINOPHEN 650 MG RE SUPP: 650.0000 mg | Freq: Four times a day (QID) | RECTAL | Status: DC | PRN

## 2019-02-17 MED ORDER — LORATADINE 10 MG PO TABS
10.0000 mg | ORAL_TABLET | Freq: Every day | ORAL | Status: DC
  Administered 2019-02-17 – 2019-02-21 (×5): 10 mg via ORAL
  Filled 2019-02-17 (×5): qty 1

## 2019-02-17 MED ORDER — ONDANSETRON HCL 4 MG PO TABS: 4.0000 mg | ORAL_TABLET | Freq: Four times a day (QID) | ORAL | Status: DC | PRN

## 2019-02-17 MED ORDER — ACETAMINOPHEN 325 MG PO TABS
650.0000 mg | ORAL_TABLET | Freq: Four times a day (QID) | ORAL | Status: DC | PRN
  Administered 2019-02-19: 650 mg via ORAL
  Filled 2019-02-17: qty 2

## 2019-02-17 MED ORDER — MOMETASONE FURO-FORMOTEROL FUM 200-5 MCG/ACT IN AERO
2.0000 | INHALATION_SPRAY | Freq: Two times a day (BID) | RESPIRATORY_TRACT | Status: DC
  Administered 2019-02-17 – 2019-02-21 (×8): 2 via RESPIRATORY_TRACT
  Filled 2019-02-17: qty 8.8

## 2019-02-17 MED ORDER — SODIUM CHLORIDE 0.9 % IV BOLUS
250.0000 mL | Freq: Once | INTRAVENOUS | Status: AC
  Administered 2019-02-17: 12:00:00 250 mL via INTRAVENOUS

## 2019-02-17 MED ORDER — LEVOTHYROXINE SODIUM 50 MCG PO TABS
50.0000 ug | ORAL_TABLET | Freq: Every day | ORAL | Status: DC
  Administered 2019-02-18 – 2019-02-21 (×4): 50 ug via ORAL
  Filled 2019-02-17 (×4): qty 1

## 2019-02-17 MED ORDER — ALPRAZOLAM 0.5 MG PO TABS
0.5000 mg | ORAL_TABLET | Freq: Every day | ORAL | Status: DC | PRN
  Administered 2019-02-17 – 2019-02-18 (×2): 0.5 mg via ORAL
  Filled 2019-02-17 (×3): qty 1

## 2019-02-17 MED ORDER — SODIUM CHLORIDE 0.9 % IV SOLN
INTRAVENOUS | Status: DC
  Administered 2019-02-17 – 2019-02-21 (×7): via INTRAVENOUS

## 2019-02-17 MED ORDER — MOMETASONE FURO-FORMOTEROL FUM 100-5 MCG/ACT IN AERO: 2.0000 | INHALATION_SPRAY | Freq: Two times a day (BID) | RESPIRATORY_TRACT | Status: DC

## 2019-02-17 MED ORDER — FERROUS SULFATE 325 (65 FE) MG PO TABS
325.0000 mg | ORAL_TABLET | Freq: Every day | ORAL | Status: DC
  Administered 2019-02-18 – 2019-02-21 (×4): 325 mg via ORAL
  Filled 2019-02-17 (×4): qty 1

## 2019-02-17 NOTE — ED Notes (Signed)
Admitting doctor at bedside 

## 2019-02-17 NOTE — ED Notes (Signed)
.. ED TO INPATIENT HANDOFF REPORT  ED Nurse Name and Phone #: Deneise Lever [phone]  S Name/Age/Gender Janice Perkins 85 y.o. female Room/Bed: ED07A/ED07A  Code Status   Code Status: Prior  Home/SNF/Other Home Patient oriented to: self, place, time and situation Is this baseline? Yes   Triage Complete: Triage complete  Chief Complaint weakness  Triage Note Pt to ED via [place] stating that she was seen last week and given fluid because she was dehydrated. Pt stated that yesterday she started to feel dehydrated again. Pt states that she is also having black stools, pt states that she is currently taking iron pills. Pt reports that she was talking on the phone to her son yesterday and she kept falling asleep and that is not normal for her. Pt reports that yesterday was "a rough day". Pt is in NAD   Allergies Allergies  Allergen Reactions  . Diphenhydramine Hcl Rash  . Penicillins Hives    .Has patient had a PCN reaction causing immediate rash, facial/tongue/throat swelling, SOB or lightheadedness with hypotension: Unknown Has patient had a PCN reaction causing severe rash involving mucus membranes or skin necrosis: Unknown Has patient had a PCN reaction that required hospitalization: Unknown Has patient had a PCN reaction occurring within the last 10 years: Unknown If all of the above answers are "NO", then may proceed with Cephalosporin use.   . Captopril Other (See Comments)  . Enalapril Maleate Other (See Comments)    Other reaction(s): Unknown  . Tape Itching  . Terfenadine Other (See Comments)  . Augmentin [Amoxicillin-Pot Clavulanate] Rash    Has patient had a PCN reaction causing immediate rash, facial/tongue/throat swelling, SOB or lightheadedness with hypotension: Unknown Has patient had a PCN reaction causing severe rash involving mucus membranes or skin necrosis: Unknown Has patient had a PCN reaction that required hospitalization: Unknown Has patient had a PCN reaction  occurring within the last 10 years: Unknown If all of the above answers are "NO", then may proceed with Cephalosporin use.  . Biaxin [Clarithromycin] Rash, Other (See Comments) and Hives    Other reaction(s): Unknown    Level of Care/Admitting Diagnosis ED Disposition    ED Disposition Condition Comment   Admit  The patient appears reasonably stabilized for admission considering the current resources, flow, and capabilities available in the ED at this time, and I doubt any other [facility] requiring further screening and/or treatment in the ED prior to admission is  present.       B Medical/Surgery History Past Medical History:  Diagnosis Date  . Asthma   . Atrial fibrillation ([place])   . Breast cancer ([place]) 2011  . Bronchitis 04/2015  . Cancer [facility]) 2011   breast  . COPD (chronic obstructive pulmonary disease) ([place])   . Hypertension   . Shingles    10/2015   Past Surgical History:  Procedure Laterality Date  . BREAST EXCISIONAL BIOPSY Right 11/29/13   two areas FAT NECROSIS  . BREAST EXCISIONAL BIOPSY Right 10/30/2009   lumpectomy - radiation  . COLONOSCOPY WITH PROPOFOL N/A 06/10/2018   Procedure: COLONOSCOPY WITH PROPOFOL;  Surgeon: Manya Silvas, MD;  Location: [facility] ENDOSCOPY;  Service: Endoscopy;  Laterality: N/A;  . ESOPHAGOGASTRODUODENOSCOPY (EGD) WITH PROPOFOL N/A 06/10/2018   Procedure: ESOPHAGOGASTRODUODENOSCOPY (EGD) WITH PROPOFOL;  Surgeon: Manya Silvas, MD;  Location: [facility] ENDOSCOPY;  Service: Endoscopy;  Laterality: N/A;  . NASAL SINUS SURGERY       A IV Location/Drains/Wounds Patient Lines/Drains/Airways Status   Active Line/Drains/Airways  Name:   Placement date:   Placement time:   Site:   Days:   Peripheral IV 02/17/19 Left Antecubital   02/17/19    1144    Antecubital   less than 1          Intake/Output Last 24 hours  Intake/Output Summary (Last 24 hours) at 02/17/2019 1253 Last data filed at 02/17/2019 1205 Gross per 24 hour  Intake 250 ml   Output -  Net 250 ml    Labs/Imaging Results for orders placed or performed during the hospital encounter of 02/17/19 (from the past 48 hour(s))  CBC with Differential     Status: None   Collection Time: 02/17/19 11:10 AM  Result Value Ref Range   WBC 6.9 4.0 - 10.5 K/uL   RBC 3.99 3.87 - 5.11 MIL/uL   Hemoglobin 12.3 12.0 - 15.0 g/dL   HCT 36.4 36.0 - 46.0 %   MCV 91.2 80.0 - 100.0 fL   MCH 30.8 26.0 - 34.0 pg   MCHC 33.8 30.0 - 36.0 g/dL   RDW 13.2 11.5 - 15.5 %   Platelets 279 150 - 400 K/uL   nRBC 0.0 0.0 - 0.2 %   Neutrophils Relative % 53 %   Neutro Abs 3.7 1.7 - 7.7 K/uL   Lymphocytes Relative 26 %   Lymphs Abs 1.8 0.7 - 4.0 K/uL   Monocytes Relative 14 %   Monocytes Absolute 1.0 0.1 - 1.0 K/uL   Eosinophils Relative 5 %   Eosinophils Absolute 0.3 0.0 - 0.5 K/uL   Basophils Relative 1 %   Basophils Absolute 0.1 0.0 - 0.1 K/uL   Immature Granulocytes 1 %   Abs Immature Granulocytes 0.04 0.00 - 0.07 K/uL    Comment: Performed at [facility], [address] [place], [place] [address]  Urinalysis, Complete w Microscopic     Status: Abnormal   Collection Time: 02/17/19 11:10 AM  Result Value Ref Range   Color, Urine STRAW (A) YELLOW   APPearance CLEAR (A) CLEAR   Specific Gravity, Urine 1.003 (L) 1.005 - 1.030   pH 7.0 5.0 - 8.0   Glucose, UA NEGATIVE NEGATIVE mg/dL   Hgb urine dipstick NEGATIVE NEGATIVE   Bilirubin Urine NEGATIVE NEGATIVE   Ketones, ur NEGATIVE NEGATIVE mg/dL   Protein, ur NEGATIVE NEGATIVE mg/dL   Nitrite NEGATIVE NEGATIVE   Leukocytes,Ua NEGATIVE NEGATIVE   WBC, UA 0-5 0 - 5 WBC/hpf   Bacteria, UA NONE SEEN NONE SEEN   Squamous Epithelial / LPF NONE SEEN 0 - 5    Comment: Performed at [facility], [address] [place], [place] [address]  Basic metabolic panel     Status: Abnormal   Collection Time: 02/17/19 12:05 PM  Result Value Ref Range   Sodium 132 (L) 135 - 145 mmol/L   Potassium 3.9 3.5 - 5.1 mmol/L    Chloride 97 (L) 98 - 111 mmol/L   CO2 25 22 - 32 mmol/L   Glucose, Bld 95 70 - 99 mg/dL   BUN 10 8 - 23 mg/dL   Creatinine, Ser 0.79 0.44 - 1.00 mg/dL   Calcium 9.2 8.9 - 10.3 mg/dL   GFR calc non Af Amer >60 >60 mL/min   GFR calc Af Amer >60 >60 mL/min   Anion gap 10 5 - 15    Comment: Performed at [facility], [address], [place], [place] [address]  Protime-INR     Status: None   Collection Time:  02/17/19 12:05 PM  Result Value Ref Range   Prothrombin Time 12.3 11.4 - 15.2 seconds   INR 0.9 0.8 - 1.2    Comment: (NOTE) INR goal varies based on device and disease states. Performed at [facility], [address] [place], [place] [address]    No results found.  Pending Labs Unresulted Labs (From admission, onward)    Start     Ordered   02/17/19 1202  Type and screen  Once,   R     02/17/19 1202   02/17/19 1129  SARS Coronavirus 2 (CEPHEID - Performed in [place] hospital lab), Hosp Order  (Asymptomatic Patients Labs)  Once,   STAT    Question:  Rule Out  Answer:  Yes   02/17/19 1128          Vitals/Pain Today's Vitals   02/17/19 1100 02/17/19 1105 02/17/19 1215 02/17/19 1230  BP: (!) 189/82  (!) 158/72 (!) 161/73  Pulse: 75  (!) 58 60  Resp: 18     Temp: 98.9 F (37.2 C)     TempSrc: Oral     SpO2: 100%  96% 96%  PainSc:  0-No pain      Isolation Precautions No active isolations  Medications Medications  sodium chloride 0.9 % bolus 250 mL (0 mLs Intravenous Stopped 02/17/19 1205)    Mobility walks Low fall risk   Focused Assessments Neuro Assessment Handoff:  Swallow screen pass? Yes  Cardiac Rhythm: Normal sinus rhythm       Neuro Assessment: Within Defined Limits Neuro Checks:      Last Documented NIHSS Modified Score:   Has TPA been given? No If patient is a Neuro Trauma and patient is going to OR before floor call report to [place] nurse: [phone] or [phone]     R Recommendations: See Admitting  Provider Note  Report given to:   Additional Notes:

## 2019-02-17 NOTE — Progress Notes (Signed)
Advanced care plan. Purpose of the Encounter: CODE STATUS Parties in Attendance: Patient Patient's Decision Capacity: Good Subjective/Patient's story: Janice Perkins  is a 85 y.o. female with a known history of chronic atrial fibrillation on Eliquis for anticoagulation, COPD, hypertension, breast cancer presented to the emergency room for black tarry stool.  Patient also had an episode of nasal bleed.  Nasal bleed has been self-limiting.  Hemoglobin is stable in the emergency room greater than 12.  No complains of any abdominal pain.  No complaints of shortness of breath.  COVID-19 test is negative. Objective/Medical story Patient needs serial hemoglobin hematocrit monitoring and gastroenterology evaluation possible EGD and colonoscopy.  Clinical monitoring. Goals of care determination:  Advance care directives goals of care treatment plan discussed Patient wants everything done which includes CPR, intubation ventilator if the need arises CODE STATUS: Full code Time spent discussing advanced care planning: 16 minutes

## 2019-02-17 NOTE — H&P (Signed)
[facility] [place] NAME: Janice Perkins    MR#:  [id number]  DATE OF BIRTH:  July 04, 1933  DATE OF ADMISSION:  02/17/2019  PRIMARY CARE PHYSICIAN: [name], MD   REQUESTING/REFERRING PHYSICIAN:   CHIEF COMPLAINT:   Chief Complaint  Patient presents with  . multiple medical complaints    HISTORY OF PRESENT ILLNESS: Janice Perkins  is a 85 y.o. female with a known history of chronic atrial fibrillation on Eliquis for anticoagulation, COPD, hypertension, breast cancer presented to the emergency room for black tarry stool.  Patient also had an episode of nasal bleed.  Nasal bleed has been self-limiting.  Hemoglobin is stable in the emergency room greater than 12.  No complains of any abdominal pain.  No complaints of shortness of breath.  COVID-19 test is negative.  PAST MEDICAL HISTORY:   Past Medical History:  Diagnosis Date  . Asthma   . Atrial fibrillation ([place])   . Breast cancer ([place]) 2011  . Bronchitis 04/2015  . Cancer [facility]) 2011   breast  . COPD (chronic obstructive pulmonary disease) ([place])   . Hypertension   . Shingles    10/2015    PAST SURGICAL HISTORY:  Past Surgical History:  Procedure Laterality Date  . BREAST EXCISIONAL BIOPSY Right 11/29/13   two areas FAT NECROSIS  . BREAST EXCISIONAL BIOPSY Right 10/30/2009   lumpectomy - radiation  . COLONOSCOPY WITH PROPOFOL N/A 06/10/2018   Procedure: COLONOSCOPY WITH PROPOFOL;  Surgeon: Manya Silvas, MD;  Location: [facility] ENDOSCOPY;  Service: Endoscopy;  Laterality: N/A;  . ESOPHAGOGASTRODUODENOSCOPY (EGD) WITH PROPOFOL N/A 06/10/2018   Procedure: ESOPHAGOGASTRODUODENOSCOPY (EGD) WITH PROPOFOL;  Surgeon: Manya Silvas, MD;  Location: [facility] ENDOSCOPY;  Service: Endoscopy;  Laterality: N/A;  . NASAL SINUS SURGERY      SOCIAL HISTORY:  Social History   Tobacco Use  . Smoking status: Never Smoker  . Smokeless tobacco: Never Used  Substance Use Topics  .  Alcohol use: No    Alcohol/week: 0.0 standard drinks    FAMILY HISTORY:  Family History  Problem Relation Age of Onset  . Hypertension Mother   . Heart disease Mother   . Cancer Mother   . Stroke Father   . Hypertension Father   . Breast cancer Sister     DRUG ALLERGIES:  Allergies  Allergen Reactions  . Diphenhydramine Hcl Rash  . Penicillins Hives    .Has patient had a PCN reaction causing immediate rash, facial/tongue/throat swelling, SOB or lightheadedness with hypotension: Unknown Has patient had a PCN reaction causing severe rash involving mucus membranes or skin necrosis: Unknown Has patient had a PCN reaction that required hospitalization: Unknown Has patient had a PCN reaction occurring within the last 10 years: Unknown If all of the above answers are "NO", then may proceed with Cephalosporin use.   . Captopril Other (See Comments)  . Enalapril Maleate Other (See Comments)    Other reaction(s): Unknown  . Tape Itching  . Terfenadine Other (See Comments)  . Augmentin [Amoxicillin-Pot Clavulanate] Rash    Has patient had a PCN reaction causing immediate rash, facial/tongue/throat swelling, SOB or lightheadedness with hypotension: Unknown Has patient had a PCN reaction causing severe rash involving mucus membranes or skin necrosis: Unknown Has patient had a PCN reaction that required hospitalization: Unknown Has patient had a PCN reaction occurring within the last 10 years: Unknown If all of the above answers are "NO", then may proceed with Cephalosporin use.  Marland Kitchen  Biaxin [Clarithromycin] Rash, Other (See Comments) and Hives    Other reaction(s): Unknown    REVIEW OF SYSTEMS:   CONSTITUTIONAL: No fever, has fatigue and weakness.  EYES: No blurred or double vision.  EARS, NOSE, AND THROAT: No tinnitus or ear pain.  RESPIRATORY: No cough, shortness of breath, wheezing or hemoptysis.  CARDIOVASCULAR: No chest pain, orthopnea, edema.  GASTROINTESTINAL: No nausea,  vomiting, diarrhea or abdominal pain.  GENITOURINARY: No dysuria, hematuria.  ENDOCRINE: No polyuria, nocturia,  HEMATOLOGY: No anemia, easy bruising or bleeding Black stools. SKIN: No rash or lesion. MUSCULOSKELETAL: No joint pain or arthritis.   NEUROLOGIC: No tingling, numbness, weakness.  PSYCHIATRY: No anxiety or depression.   MEDICATIONS AT HOME:  Prior to Admission medications   Medication Sig Start Date End Date Taking? Authorizing Provider  albuterol (ACCUNEB) 1.25 MG/3ML nebulizer solution Inhale 3 mLs into the lungs every 6 (six) hours as needed for wheezing.     [provider]  albuterol (PROVENTIL HFA;VENTOLIN HFA) 108 (90 Base) MCG/ACT inhaler Inhale 2 puffs into the lungs every 6 (six) hours as needed for wheezing or shortness of breath.     [provider]  ALPRAZolam Duanne Moron) 0.5 MG tablet Take 0.5 mg by mouth daily as needed for anxiety or sleep.    [provider]  apixaban (ELIQUIS) 5 MG TABS tablet Take 1 tablet (5 mg total) by mouth 2 (two) times daily. 12/13/18   Loletha Grayer, MD  atorvastatin (LIPITOR) 20 MG tablet Take 20 mg by mouth at bedtime.     [provider]  busPIRone (BUSPAR) 10 MG tablet Take 10 mg by mouth 2 (two) times daily.    [provider]  cholecalciferol (VITAMIN D) 1000 units tablet Take 1,000 Units by mouth daily.    [provider]  clindamycin (CLEOCIN) 300 MG capsule Take 1 capsule (300 mg total) by mouth 4 (four) times daily for 10 days. 02/08/19 02/18/19  Nance Pear, MD  diltiazem (DILACOR XR) 120 MG 24 hr capsule Take 120 mg by mouth daily.    [provider]  escitalopram (LEXAPRO) 5 MG tablet Take 5 mg by mouth daily.    [provider]  esomeprazole (NEXIUM) 40 MG capsule Take 40 mg by mouth 2 (two) times daily before a meal.     [provider]  ferrous sulfate 325 (65 FE) MG tablet Take 325 mg by mouth daily with breakfast.    [provider]   Fluticasone-Salmeterol (ADVAIR) 250-50 MCG/DOSE AEPB Inhale 1 puff into the lungs 2 (two) times daily.    [provider]  hydrALAZINE (APRESOLINE) 10 MG tablet Take 1 tablet (10 mg total) by mouth 3 (three) times daily. 12/13/18   Loletha Grayer, MD  HYDROcodone-acetaminophen (NORCO/VICODIN) 5-325 MG tablet Take 1 tablet by mouth 2 (two) times daily as needed for moderate pain or severe pain.     [provider]  levothyroxine (SYNTHROID, LEVOTHROID) 50 MCG tablet Take 50 mcg by mouth daily before breakfast.    [provider]  loratadine (CLARITIN) 10 MG tablet Take 10 mg by mouth daily.    [provider]  losartan (COZAAR) 50 MG tablet Take 2 tablets (100 mg total) by mouth at bedtime. 12/13/18   Loletha Grayer, MD  magnesium oxide (MAG-OX) 400 MG tablet Take 400 mg by mouth 2 (two) times daily.    [provider]  meclizine (ANTIVERT) 25 MG tablet Take 1 tablet (25 mg total) by mouth 2 (two) times  daily as needed for dizziness. 09/16/18   Merlyn Lot, MD  mometasone-formoterol (DULERA) 100-5 MCG/ACT AERO Inhale 2 puffs into the lungs 2 (two) times daily.    [provider]  polyethylene glycol (MIRALAX / GLYCOLAX) 17 g packet Take 17 g by mouth daily. 01/03/19   Earleen Newport, MD      PHYSICAL EXAMINATION:   VITAL SIGNS: Blood pressure (!) 148/75, pulse 65, temperature 98.9 F (37.2 C), temperature source Oral, resp. rate 17, SpO2 97 %.  GENERAL:  85 y.o.-year-old patient lying in the bed with no acute distress.  EYES: Pupils equal, round, reactive to light and accommodation. No scleral icterus. Extraocular muscles intact.  HEENT: Head atraumatic, normocephalic. Oropharynx and nasopharynx clear.  NECK:  Supple, no jugular venous distention. No thyroid enlargement, no tenderness.  LUNGS: Normal breath sounds bilaterally, no wheezing, rales,rhonchi or crepitation. No use of accessory muscles of respiration.   CARDIOVASCULAR: S1, S2 normal. No murmurs, rubs, or gallops.  ABDOMEN: Soft, nontender, nondistended. Bowel sounds present. No organomegaly or mass.  EXTREMITIES: No pedal edema, cyanosis, or clubbing.  NEUROLOGIC: Cranial nerves II through XII are intact. Muscle strength 5/5 in all extremities. Sensation intact. Gait not checked.  PSYCHIATRIC: The patient is alert and oriented x 3.  SKIN: No obvious rash, lesion, or ulcer.   LABORATORY PANEL:   CBC Recent Labs  Lab 02/12/19 0932 02/17/19 1110  WBC 7.8 6.9  HGB 12.5 12.3  HCT 36.4 36.4  PLT 225 279  MCV 90.8 91.2  MCH 31.2 30.8  MCHC 34.3 33.8  RDW 13.2 13.2  LYMPHSABS  --  1.8  MONOABS  --  1.0  EOSABS  --  0.3  BASOSABS  --  0.1   ------------------------------------------------------------------------------------------------------------------  Chemistries  Recent Labs  Lab 02/12/19 0932 02/17/19 1205  NA 131* 132*  K 3.6 3.9  CL 97* 97*  CO2 24 25  GLUCOSE 118* 95  BUN 12 10  CREATININE 1.03* 0.79  CALCIUM 9.3 9.2   ------------------------------------------------------------------------------------------------------------------ estimated creatinine clearance is 45.7 mL/min (by C-G formula based on SCr of 0.79 mg/dL). ------------------------------------------------------------------------------------------------------------------ No results for input(s): TSH, T4TOTAL, T3FREE, THYROIDAB in the last 72 hours.  Invalid input(s): FREET3   Coagulation profile Recent Labs  Lab 02/12/19 0932 02/17/19 1205  INR 1.0 0.9   ------------------------------------------------------------------------------------------------------------------- No results for input(s): DDIMER in the last 72 hours. -------------------------------------------------------------------------------------------------------------------  Cardiac Enzymes No results for input(s): CKMB, TROPONINI, MYOGLOBIN in the last 168 hours.  Invalid  input(s): CK ------------------------------------------------------------------------------------------------------------------ Invalid input(s): POCBNP  ---------------------------------------------------------------------------------------------------------------  Urinalysis    Component Value Date/Time   COLORURINE STRAW (A) 02/17/2019 1110   APPEARANCEUR CLEAR (A) 02/17/2019 1110   LABSPEC 1.003 (L) 02/17/2019 1110   PHURINE 7.0 02/17/2019 1110   GLUCOSEU NEGATIVE 02/17/2019 1110   HGBUR NEGATIVE 02/17/2019 1110   BILIRUBINUR NEGATIVE 02/17/2019 1110   KETONESUR NEGATIVE 02/17/2019 1110   PROTEINUR NEGATIVE 02/17/2019 1110   NITRITE NEGATIVE 02/17/2019 1110   LEUKOCYTESUR NEGATIVE 02/17/2019 1110     RADIOLOGY: No results found.  EKG: Orders placed or performed during the hospital encounter of 02/12/19  . ED EKG  . ED EKG  . EKG    IMPRESSION AND PLAN: 85 year old elderly female patient with a known history of chronic atrial fibrillation on Eliquis for anticoagulation, COPD, hypertension, breast cancer presented to the emergency room for black tarry stool.   -Acute gastrointestinal bleeding Admit patient to medical floor observation bed IV Protonix 40 MDQ 12 hourly Gastroenterology consult Clear liquid diet  Hold Eliquis  -Epistaxis self-limiting Clinical monitoring  -Chronic atrial fibrillation Continue oral diltiazem for rate control  -Emphysema stable Home dose inhaler therapy  -DVT prophylaxis sequential compression device to lower extremities  -History of breast cancer Supportive care  All the records are reviewed and case discussed with ED provider. Management plans discussed with the patient, family and they are in agreement.  CODE STATUS:Full code Code Status History    Date Active Date Inactive Code Status Order ID Comments User Context   12/11/2018 1041 12/13/2018 1437 Full Code [id number]  Loletha Grayer, MD ED   09/02/2018 1156 09/08/2018  1621 Full Code [id number]  Nicholes Mango, MD Inpatient   05/30/2018 1127 05/31/2018 1740 Full Code [id number]  Bettey Costa, MD Inpatient   11/15/2017 1829 11/21/2017 1821 Full Code [id number]  Vaughan Basta, MD Inpatient   05/16/2017 0041 05/17/2017 1824 Full Code [id number]  Quintella Baton, MD ED   03/07/2017 1446 03/09/2017 1509 Full Code [id number]  Idelle Crouch, MD Inpatient   01/19/2017 0837 01/21/2017 1415 Full Code [id number]  Harrie Foreman, MD Inpatient   12/18/2016 0718 12/22/2016 1947 Full Code [id number]  Harrie Foreman, MD Inpatient   Advance Care Planning Activity    Advance Directive Documentation     Most Recent Value  Type of Advance Directive  Living will  Pre-existing out of facility DNR order (yellow form or pink MOST form)  -  "MOST" Form in Place?  -     TOTAL TIME TAKING CARE OF THIS PATIENT: 53 minutes.    Saundra Shelling M.D on 02/17/2019 at 2:04 PM   Between 7am to 6pm - Pager - [phone]  After 6pm go to [email or web address] - password [email or web address] [facility]  Office  [phone]  CC: Primary care physician; [name], MD

## 2019-02-17 NOTE — ED Notes (Signed)
This RN attempted to call report at this time. Beth, RN informed this RN that Hinton Dyer, RN would be receiving pt and would call back in 5 minutes for report.

## 2019-02-17 NOTE — ED Triage Notes (Signed)
Pt to ED via POV stating that she was seen last week and given fluid because she was dehydrated. Pt stated that yesterday she started to feel dehydrated again. Pt states that she is also having black stools, pt states that she is currently taking iron pills. Pt reports that she was talking on the phone to her son yesterday and she kept falling asleep and that is not normal for her. Pt reports that yesterday was "a rough day". Pt is in NAD

## 2019-02-17 NOTE — ED Provider Notes (Signed)
[facility] Emergency Department Provider Note  ____________________________________________   I have reviewed the triage vital signs and the nursing notes. Where available I have reviewed prior notes and, if possible and indicated, outside hospital notes.    HISTORY  Chief Complaint multiple medical complaints    HPI Janice Perkins is a 85 y.o. female with history of atrial fibrillation, she has been on blood thinner she states but no longer is aching, unclear when she stopped, she did have a recent nosebleed as well.  She had black stool 3 times in the last couple days.  Does not feel lightheaded but does feel "a little dehydrated".  Has also been somewhat more sleepy than normal without any focal numbness or weakness no fall and no trauma.  States his stool is black and tarry.   Past Medical History:  Diagnosis Date  . Asthma   . Atrial fibrillation ([place])   . Breast cancer ([place]) 2011  . Bronchitis 04/2015  . Cancer [facility]) 2011   breast  . COPD (chronic obstructive pulmonary disease) ([place])   . Hypertension   . Shingles    10/2015    Patient Active Problem List   Diagnosis Date Noted  . Hypertensive encephalopathy 12/11/2018  . Hypoxia 05/30/2018  . Leukocytosis 04/05/2018  . Pneumonia 11/15/2017  . Pleural effusion 05/16/2017  . AF (paroxysmal atrial fibrillation) ([place]) 05/16/2017  . Breast cancer ([place]) 05/16/2017  . Dependence on nocturnal oxygen therapy 05/16/2017  . HTN (hypertension) 05/16/2017  . Generalized weakness 03/07/2017  . Hyponatremia 03/07/2017  . Dehydration 03/07/2017  . UTI (urinary tract infection) 03/07/2017  . HCAP (healthcare-associated pneumonia) 01/19/2017  . AKI (acute kidney injury) ([place]) 12/18/2016  . Primary cancer of upper outer quadrant of right female breast ([place]) 04/07/2016  . Lumbar radiculopathy 03/23/2016  . Spinal stenosis, lumbar region, with neurogenic claudication 03/23/2016  . DDD (degenerative disc  disease), lumbar 01/14/2015  . Lumbosacral facet joint syndrome 01/14/2015  . Sacroiliac joint dysfunction 01/14/2015  . Greater trochanteric bursitis 01/14/2015  . Bilateral occipital neuralgia 01/14/2015    Past Surgical History:  Procedure Laterality Date  . BREAST EXCISIONAL BIOPSY Right 11/29/13   two areas FAT NECROSIS  . BREAST EXCISIONAL BIOPSY Right 10/30/2009   lumpectomy - radiation  . COLONOSCOPY WITH PROPOFOL N/A 06/10/2018   Procedure: COLONOSCOPY WITH PROPOFOL;  Surgeon: Manya Silvas, MD;  Location: [facility] ENDOSCOPY;  Service: Endoscopy;  Laterality: N/A;  . ESOPHAGOGASTRODUODENOSCOPY (EGD) WITH PROPOFOL N/A 06/10/2018   Procedure: ESOPHAGOGASTRODUODENOSCOPY (EGD) WITH PROPOFOL;  Surgeon: Manya Silvas, MD;  Location: [facility] ENDOSCOPY;  Service: Endoscopy;  Laterality: N/A;  . NASAL SINUS SURGERY      Prior to Admission medications   Medication Sig Start Date End Date Taking? Authorizing Provider  albuterol (ACCUNEB) 1.25 MG/3ML nebulizer solution Inhale 3 mLs into the lungs every 6 (six) hours as needed for wheezing.     [provider]  albuterol (PROVENTIL HFA;VENTOLIN HFA) 108 (90 Base) MCG/ACT inhaler Inhale 2 puffs into the lungs every 6 (six) hours as needed for wheezing or shortness of breath.     [provider]  ALPRAZolam Duanne Moron) 0.5 MG tablet Take 0.5 mg by mouth daily as needed for anxiety or sleep.    [provider]  apixaban (ELIQUIS) 5 MG TABS tablet Take 1 tablet (5 mg total) by mouth 2 (two) times daily. 12/13/18   Loletha Grayer, MD  atorvastatin (LIPITOR) 20 MG tablet Take 20 mg by mouth at bedtime.  [provider]  busPIRone (BUSPAR) 10 MG tablet Take 10 mg by mouth 2 (two) times daily.    [provider]  cholecalciferol (VITAMIN D) 1000 units tablet Take 1,000 Units by mouth daily.    [provider]  clindamycin (CLEOCIN) 300 MG capsule Take 1 capsule (300 mg total) by mouth 4 (four)  times daily for 10 days. 02/08/19 02/18/19  Nance Pear, MD  diltiazem (DILACOR XR) 120 MG 24 hr capsule Take 120 mg by mouth daily.    [provider]  escitalopram (LEXAPRO) 5 MG tablet Take 5 mg by mouth daily.    [provider]  esomeprazole (NEXIUM) 40 MG capsule Take 40 mg by mouth 2 (two) times daily before a meal.     [provider]  ferrous sulfate 325 (65 FE) MG tablet Take 325 mg by mouth daily with breakfast.    [provider]  Fluticasone-Salmeterol (ADVAIR) 250-50 MCG/DOSE AEPB Inhale 1 puff into the lungs 2 (two) times daily.    [provider]  hydrALAZINE (APRESOLINE) 10 MG tablet Take 1 tablet (10 mg total) by mouth 3 (three) times daily. 12/13/18   Loletha Grayer, MD  HYDROcodone-acetaminophen (NORCO/VICODIN) 5-325 MG tablet Take 1 tablet by mouth 2 (two) times daily as needed for moderate pain or severe pain.     [provider]  levothyroxine (SYNTHROID, LEVOTHROID) 50 MCG tablet Take 50 mcg by mouth daily before breakfast.    [provider]  loratadine (CLARITIN) 10 MG tablet Take 10 mg by mouth daily.    [provider]  losartan (COZAAR) 50 MG tablet Take 2 tablets (100 mg total) by mouth at bedtime. 12/13/18   Loletha Grayer, MD  magnesium oxide (MAG-OX) 400 MG tablet Take 400 mg by mouth 2 (two) times daily.    [provider]  meclizine (ANTIVERT) 25 MG tablet Take 1 tablet (25 mg total) by mouth 2 (two) times daily as needed for dizziness. 09/16/18   Merlyn Lot, MD  mometasone-formoterol (DULERA) 100-5 MCG/ACT AERO Inhale 2 puffs into the lungs 2 (two) times daily.    [provider]  polyethylene glycol (MIRALAX / GLYCOLAX) 17 g packet Take 17 g by mouth daily. 01/03/19   Earleen Newport, MD    Allergies Diphenhydramine hcl, Penicillins, Captopril, Enalapril maleate, Tape, Terfenadine, Augmentin [amoxicillin-pot clavulanate], and Biaxin [clarithromycin]  Family  History  Problem Relation Age of Onset  . Hypertension Mother   . Heart disease Mother   . Cancer Mother   . Stroke Father   . Hypertension Father   . Breast cancer Sister     Social History Social History   Tobacco Use  . Smoking status: Never Smoker  . Smokeless tobacco: Never Used  Substance Use Topics  . Alcohol use: No    Alcohol/week: 0.0 standard drinks  . Drug use: No    Review of Systems Constitutional: No fever/chills Eyes: No visual changes. ENT: No sore throat. No stiff neck no neck pain Cardiovascular: Denies chest pain. Respiratory: Denies shortness of breath. Gastrointestinal:   no vomiting.  No diarrhea.  No constipation. Genitourinary: Negative for dysuria. Musculoskeletal: Negative lower extremity swelling Skin: Negative for rash. Neurological: Negative for severe headaches, focal weakness or numbness.   ____________________________________________   PHYSICAL EXAM:  VITAL SIGNS: ED Triage Vitals  Enc Vitals Group     BP 02/17/19 1100 (!) 189/82     Pulse Rate 02/17/19 1100 75     Resp 02/17/19 1100 18  Temp 02/17/19 1100 98.9 F (37.2 C)     Temp Source 02/17/19 1100 Oral     SpO2 02/17/19 1100 100 %     Weight --      Height --      Head Circumference --      Peak Flow --      Pain Score 02/17/19 1105 0     Pain Loc --      Pain Edu? --      Excl. in Universal? --     Constitutional: Alert and oriented. Well appearing and in no acute distress. Eyes: Conjunctivae are normal Head: Atraumatic HEENT: No congestion/rhinnorhea. Mucous membranes are moist.  Oropharynx non-erythematous Neck:   Nontender with no meningismus, no masses, no stridor Cardiovascular: Normal rate, regular rhythm. Grossly normal heart sounds.  Good peripheral circulation. Respiratory: Normal respiratory effort.  No retractions. Lungs CTAB. Abdominal: Soft and nontender. No distention. No guarding no rebound Back:  There is no focal tenderness or step off.  there is  no midline tenderness there are no lesions noted. there is no CVA tenderness Musculoskeletal: No lower extremity tenderness, no upper extremity tenderness. No joint effusions, no DVT signs strong distal pulses no edema Neurologic:  Normal speech and language. No gross focal neurologic deficits are appreciated.  Skin:  Skin is warm, dry and intact. No rash noted. Psychiatric: Mood and affect are normal. Speech and behavior are normal.  ____________________________________________   LABS (all labs ordered are listed, but only abnormal results are displayed)  Labs Reviewed  CBC WITH DIFFERENTIAL/PLATELET  URINALYSIS, COMPLETE (UACMP) WITH MICROSCOPIC  BASIC METABOLIC PANEL  PROTIME-INR  TYPE AND SCREEN    Pertinent labs  results that were available during my care of the patient were reviewed by me and considered in my medical decision making (see chart for details). ____________________________________________  EKG  I personally interpreted any EKGs ordered by me or triage  ____________________________________________  RADIOLOGY  Pertinent labs & imaging results that were available during my care of the patient were reviewed by me and considered in my medical decision making (see chart for details). If possible, patient and/or family made aware of any abnormal findings.  No results found. ____________________________________________    PROCEDURES  Procedure(s) performed: None  Procedures  Critical Care performed: None  ____________________________________________   INITIAL IMPRESSION / ASSESSMENT AND PLAN / ED COURSE  Pertinent labs & imaging results that were available during my care of the patient were reviewed by me and considered in my medical decision making (see chart for details).  Patient has guaiac positive with black stool from below.  Patient did have a nosebleed about 10 days ago which I do not think is likely because black stool now.  She states she is  not taking Eliquis anymore although she cannot tell me exactly the last time she took it.  She does therefore have evidence of a GI bleed.  It is possibly also dark because of the iron pill she is taking but again it is guaiac positive.  We will send type and screen and reassess.  She likely will need admission given tarry guaiac positive stools    ____________________________________________   FINAL CLINICAL IMPRESSION(S) / ED DIAGNOSES  Final diagnoses:  None      This chart was dictated using voice recognition software.  Despite best efforts to proofread,  errors can occur which can change meaning.      Schuyler Amor, MD 02/17/19 1128

## 2019-02-18 DIAGNOSIS — Z803 Family history of malignant neoplasm of breast: Secondary | ICD-10-CM | POA: Diagnosis not present

## 2019-02-18 DIAGNOSIS — Z880 Allergy status to penicillin: Secondary | ICD-10-CM | POA: Diagnosis not present

## 2019-02-18 DIAGNOSIS — K921 Melena: Secondary | ICD-10-CM

## 2019-02-18 DIAGNOSIS — Z8701 Personal history of pneumonia (recurrent): Secondary | ICD-10-CM | POA: Diagnosis not present

## 2019-02-18 DIAGNOSIS — Z79890 Hormone replacement therapy (postmenopausal): Secondary | ICD-10-CM | POA: Diagnosis not present

## 2019-02-18 DIAGNOSIS — Z881 Allergy status to other antibiotic agents status: Secondary | ICD-10-CM | POA: Diagnosis not present

## 2019-02-18 DIAGNOSIS — Z91048 Other nonmedicinal substance allergy status: Secondary | ICD-10-CM | POA: Diagnosis not present

## 2019-02-18 DIAGNOSIS — R040 Epistaxis: Secondary | ICD-10-CM | POA: Diagnosis present

## 2019-02-18 DIAGNOSIS — D62 Acute posthemorrhagic anemia: Secondary | ICD-10-CM | POA: Diagnosis present

## 2019-02-18 DIAGNOSIS — I10 Essential (primary) hypertension: Secondary | ICD-10-CM | POA: Diagnosis present

## 2019-02-18 DIAGNOSIS — I480 Paroxysmal atrial fibrillation: Secondary | ICD-10-CM | POA: Diagnosis present

## 2019-02-18 DIAGNOSIS — Z87440 Personal history of urinary (tract) infections: Secondary | ICD-10-CM | POA: Diagnosis not present

## 2019-02-18 DIAGNOSIS — Z8249 Family history of ischemic heart disease and other diseases of the circulatory system: Secondary | ICD-10-CM | POA: Diagnosis not present

## 2019-02-18 DIAGNOSIS — Z972 Presence of dental prosthetic device (complete) (partial): Secondary | ICD-10-CM | POA: Diagnosis not present

## 2019-02-18 DIAGNOSIS — K269 Duodenal ulcer, unspecified as acute or chronic, without hemorrhage or perforation: Secondary | ICD-10-CM | POA: Diagnosis present

## 2019-02-18 DIAGNOSIS — Z7901 Long term (current) use of anticoagulants: Secondary | ICD-10-CM | POA: Diagnosis not present

## 2019-02-18 DIAGNOSIS — Z79899 Other long term (current) drug therapy: Secondary | ICD-10-CM | POA: Diagnosis not present

## 2019-02-18 DIAGNOSIS — K5900 Constipation, unspecified: Secondary | ICD-10-CM | POA: Diagnosis present

## 2019-02-18 DIAGNOSIS — Z8719 Personal history of other diseases of the digestive system: Secondary | ICD-10-CM | POA: Diagnosis not present

## 2019-02-18 DIAGNOSIS — K210 Gastro-esophageal reflux disease with esophagitis: Secondary | ICD-10-CM | POA: Diagnosis present

## 2019-02-18 DIAGNOSIS — Z1159 Encounter for screening for other viral diseases: Secondary | ICD-10-CM | POA: Diagnosis not present

## 2019-02-18 DIAGNOSIS — Z853 Personal history of malignant neoplasm of breast: Secondary | ICD-10-CM | POA: Diagnosis not present

## 2019-02-18 DIAGNOSIS — Z888 Allergy status to other drugs, medicaments and biological substances status: Secondary | ICD-10-CM | POA: Diagnosis not present

## 2019-02-18 DIAGNOSIS — K641 Second degree hemorrhoids: Secondary | ICD-10-CM | POA: Diagnosis present

## 2019-02-18 DIAGNOSIS — J439 Emphysema, unspecified: Secondary | ICD-10-CM | POA: Diagnosis present

## 2019-02-18 LAB — BASIC METABOLIC PANEL
Anion gap: 9 (ref 5–15)
BUN: 9 mg/dL (ref 8–23)
CO2: 26 mmol/L (ref 22–32)
Calcium: 8.5 mg/dL — ABNORMAL LOW (ref 8.9–10.3)
Chloride: 100 mmol/L (ref 98–111)
Creatinine, Ser: 0.82 mg/dL (ref 0.44–1.00)
GFR calc Af Amer: >60 mL/min (ref >60)
GFR calc non Af Amer: >60 mL/min (ref >60)
Glucose, Bld: 112 mg/dL — ABNORMAL HIGH (ref 70–99)
Potassium: 4.1 mmol/L (ref 3.5–5.1)
Sodium: 135 mmol/L (ref 135–145)

## 2019-02-18 LAB — HEMOGLOBIN AND HEMATOCRIT, BLOOD
HCT: 33.8 % — ABNORMAL LOW (ref 36.0–46.0)
HCT: 31.6 % — ABNORMAL LOW (ref 36.0–46.0)
Hemoglobin: 11.2 g/dL — ABNORMAL LOW (ref 12.0–15.0)
Hemoglobin: 10.7 g/dL — ABNORMAL LOW (ref 12.0–15.0)

## 2019-02-18 NOTE — Progress Notes (Signed)
[facility] NAME: Janice Perkins    MR#:  [id number]  DATE OF BIRTH:  1932-09-18  SUBJECTIVE:  CHIEF COMPLAINT:   Chief Complaint  Patient presents with  . multiple medical complaints  Patient seen and evaluated today Has black stools No vomiting of blood No abdominal pain Status post gastroenterology evaluation  REVIEW OF SYSTEMS:    ROS  CONSTITUTIONAL: No documented fever. Has fatigue, weakness. No weight gain, no weight loss.  EYES: No blurry or double vision.  ENT: No tinnitus. No postnasal drip. No redness of the oropharynx.  RESPIRATORY: No cough, no wheeze, no hemoptysis. No dyspnea.  CARDIOVASCULAR: No chest pain. No orthopnea. No palpitations. No syncope.  GASTROINTESTINAL: No nausea, no vomiting or diarrhea. No abdominal pain. Has melena. GENITOURINARY: No dysuria or hematuria.  ENDOCRINE: No polyuria or nocturia. No heat or cold intolerance.  HEMATOLOGY: No anemia. No bruising. No bleeding.  INTEGUMENTARY: No rashes. No lesions.  MUSCULOSKELETAL: No arthritis. No swelling. No gout.  NEUROLOGIC: No numbness, tingling, or ataxia. No seizure-type activity.  PSYCHIATRIC: No anxiety. No insomnia. No ADD.   DRUG ALLERGIES:   Allergies  Allergen Reactions  . Diphenhydramine Hcl Rash  . Penicillins Hives    .Has patient had a PCN reaction causing immediate rash, facial/tongue/throat swelling, SOB or lightheadedness with hypotension: Unknown Has patient had a PCN reaction causing severe rash involving mucus membranes or skin necrosis: Unknown Has patient had a PCN reaction that required hospitalization: Unknown Has patient had a PCN reaction occurring within the last 10 years: Unknown If all of the above answers are "NO", then may proceed with Cephalosporin use.   . Captopril Other (See Comments)  . Eliquis [Apixaban] Other (See Comments)    Stomach bleed  . Enalapril Maleate Other (See Comments)    Other  reaction(s): Unknown  . Tape Itching  . Terfenadine Other (See Comments)  . Augmentin [Amoxicillin-Pot Clavulanate] Rash    Has patient had a PCN reaction causing immediate rash, facial/tongue/throat swelling, SOB or lightheadedness with hypotension: Unknown Has patient had a PCN reaction causing severe rash involving mucus membranes or skin necrosis: Unknown Has patient had a PCN reaction that required hospitalization: Unknown Has patient had a PCN reaction occurring within the last 10 years: Unknown If all of the above answers are "NO", then may proceed with Cephalosporin use.  . Biaxin [Clarithromycin] Rash, Other (See Comments) and Hives    Other reaction(s): Unknown    VITALS:  Blood pressure (!) 148/99, pulse (!) 58, temperature 98.1 F (36.7 C), temperature source Oral, resp. rate 20, height 5' (1.524 m), weight 72.4 kg, SpO2 95 %.  PHYSICAL EXAMINATION:   Physical Exam  GENERAL:  85 y.o.-year-old patient lying in the bed with no acute distress.  EYES: Pupils equal, round, reactive to light and accommodation. No scleral icterus. Extraocular muscles intact.  HEENT: Head atraumatic, normocephalic. Oropharynx and nasopharynx clear.  NECK:  Supple, no jugular venous distention. No thyroid enlargement, no tenderness.  LUNGS: Normal breath sounds bilaterally, no wheezing, rales, rhonchi. No use of accessory muscles of respiration.  CARDIOVASCULAR: S1, S2 normal. No murmurs, rubs, or gallops.  ABDOMEN: Soft, nontender, nondistended. Bowel sounds present. No organomegaly or mass.  EXTREMITIES: No cyanosis, clubbing or edema b/l.    NEUROLOGIC: Cranial nerves II through XII are intact. No focal Motor or sensory deficits b/l.   PSYCHIATRIC: The patient is alert and oriented x 3.  SKIN: No obvious rash, lesion, or  ulcer.   LABORATORY PANEL:   CBC Recent Labs  Lab 02/17/19 1110  02/18/19 1020  WBC 6.9  --   --   HGB 12.3   < > 10.7*  HCT 36.4   < > 31.6*  PLT 279  --   --    <  > = values in this interval not displayed.   ------------------------------------------------------------------------------------------------------------------ Chemistries  Recent Labs  Lab 02/18/19 0414  NA 135  K 4.1  CL 100  CO2 26  GLUCOSE 112*  BUN 9  CREATININE 0.82  CALCIUM 8.5*   ------------------------------------------------------------------------------------------------------------------  Cardiac Enzymes No results for input(s): TROPONINI in the last 168 hours. ------------------------------------------------------------------------------------------------------------------  RADIOLOGY:  No results found.   ASSESSMENT AND PLAN:   85 year old female patient with history of chronic atrial fibrillation on Eliquis for anticoagulation, hypertension, COPD, breast cancer currently under hospitalist service  -Acute gastrointestinal bleeding Hold Eliquis for now Appreciate gastroenterology evaluation Auditor hemoglobin hematocrit Current hemoglobin 10.7 If hemoglobin falls less than 7 blood transfusion IV Protonix 40 MG 12 hourly Advance diet today Endoscopy on Monday as per gastroenterology  -Anemia Secondary to GI loss Monitor hemoglobin hematocrit  -Atrial fibrillation chronic Continue diltiazem for rate control  -Epistaxis has resolved  -Emphysema stable  -COVID-19 test negative  -DVT prophylaxis sequential compression device to lower extremities  All the records are reviewed and case discussed with Care Management/Social Worker. Management plans discussed with the patient, family and they are in agreement.  CODE STATUS: Full code  DVT Prophylaxis: SCDs  TOTAL TIME TAKING CARE OF THIS PATIENT: 38 minutes.   POSSIBLE D/C IN 2 to 3 DAYS, DEPENDING ON CLINICAL CONDITION.  Saundra Shelling M.D on 02/18/2019 at 11:39 AM  Between 7am to 6pm - Pager - [phone]  After 6pm go to [email or web address] - password [email or web address] [facility]   Office  [phone]  CC: Primary care physician; [name], MD  Note: This dictation was prepared with Dragon dictation along with smaller phrase technology. Any transcriptional errors that result from this process are unintentional.

## 2019-02-18 NOTE — Consult Note (Signed)
Janice Lame, MD [facility]  [address], [place], [place] [address] Phone: [phone] Fax : [phone]  Consultation  Referring Provider:     Dr. Estanislado Pandy Primary Care Physician:  [name], MD Primary Gastroenterologist:  Dr. Vira Agar         Reason for Consultation:     GI bleeding  Date of Admission:  02/17/2019 Date of Consultation:  02/18/2019         HPI:   Janice Perkins is a 85 y.o. female who reports that she has had black stools and abdominal pain.  The patient states she has been constipated but had dark black stools while taking iron.  The patient also has also been on Eliquis for atrial fibrillation.  The patient reports that she had abdominal pain for the last week which she reports to be a bloating feeling.  There is no report of any tarry stools but black hard stools.  The patient's hemoglobin on admission was 12.3 which is trended down to 11.7 later that day and 11.2 last night with 10.7 this morning.  The patient reports she continues to have bowel movements that are hard and dark.  She denies any Advil Aleve Motrin BCs or Goody powders.  The patient had a colonoscopy and EGD by Dr. Vira Agar back on June 10, 2018 with the colonoscopy showing the patient to have a large amount of stool and the procedure was canceled.  The patient was given laxatives but did not have the procedure repeated.  The patient does report that she has had a history of colon polyps in the past. Despite having the abdominal pain she states that she did not call her primary care doctor or Dr. Percell [place] office until Friday whereupon she found out that the office was closed.  There is no report of any unexplained weight loss fevers chills nausea or vomiting.  Past Medical History:  Diagnosis Date  . Asthma   . Atrial fibrillation ([place])   . Breast cancer ([place]) 2011  . Bronchitis 04/2015  . Cancer [facility]) 2011   breast  . COPD (chronic obstructive pulmonary disease) ([place])   . Hypertension    . Shingles    10/2015    Past Surgical History:  Procedure Laterality Date  . BREAST EXCISIONAL BIOPSY Right 11/29/13   two areas FAT NECROSIS  . BREAST EXCISIONAL BIOPSY Right 10/30/2009   lumpectomy - radiation  . COLONOSCOPY WITH PROPOFOL N/A 06/10/2018   Procedure: COLONOSCOPY WITH PROPOFOL;  Surgeon: Manya Silvas, MD;  Location[facility] ENDOSCOPY;  Service: Endoscopy;  Laterality: N/A;  . ESOPHAGOGASTRODUODENOSCOPY (EGD) WITH PROPOFOL N/A 06/10/2018   Procedure: ESOPHAGOGASTRODUODENOSCOPY (EGD) WITH PROPOFOL;  Surgeon: Manya Silvas, MD;  Location: [facility] ENDOSCOPY;  Service: Endoscopy;  Laterality: N/A;  . NASAL SINUS SURGERY      Prior to Admission medications   Medication Sig Start Date End Date Taking? Authorizing Provider  albuterol (ACCUNEB) 1.25 MG/3ML nebulizer solution Inhale 3 mLs into the lungs every 6 (six) hours as needed for wheezing.    Yes [provider]  albuterol (PROVENTIL HFA;VENTOLIN HFA) 108 (90 Base) MCG/ACT inhaler Inhale 2 puffs into the lungs every 6 (six) hours as needed for wheezing or shortness of breath.    Yes [provider]  alendronate (FOSAMAX) 70 MG tablet Take 70 mg by mouth once a week. 02/01/19 05/02/19 Yes [provider]  ALPRAZolam Duanne Moron) 0.5 MG tablet Take 0.25-0.5 mg by mouth 2 (two) times a day. Take  0.25 mg in the morning, then 0.5 mg at night   Yes [provider]  apixaban (ELIQUIS) 5 MG TABS tablet Take 1 tablet (5 mg total) by mouth 2 (two) times daily. 12/13/18  Yes Wieting, Richard, MD  atorvastatin (LIPITOR) 20 MG tablet Take 20 mg by mouth at bedtime.    Yes [provider]  busPIRone (BUSPAR) 10 MG tablet Take 10 mg by mouth 2 (two) times daily.   Yes [provider]  cholecalciferol (VITAMIN D) 1000 units tablet Take 1,000 Units by mouth daily.   Yes [provider]  clindamycin (CLEOCIN) 300 MG capsule Take 1 capsule (300 mg total) by mouth 4 (four) times daily for  10 days. 02/08/19 02/18/19 Yes Nance Pear, MD  cloNIDine (CATAPRES) 0.1 MG tablet Take 0.1 mg by mouth 2 (two) times daily as needed. 02/13/19  Yes [provider]  diclofenac sodium (VOLTAREN) 1 % GEL Apply 2 g topically 4 (four) times daily. 02/01/19  Yes [provider]  diltiazem (DILACOR XR) 120 MG 24 hr capsule Take 120 mg by mouth daily.   Yes [provider]  escitalopram (LEXAPRO) 5 MG tablet Take 5 mg by mouth daily.   Yes [provider]  esomeprazole (NEXIUM) 40 MG capsule Take 40 mg by mouth 2 (two) times daily before a meal.    Yes [provider]  ferrous sulfate 325 (65 FE) MG tablet Take 325 mg by mouth daily with breakfast.   Yes [provider]  Fluticasone-Salmeterol (ADVAIR) 250-50 MCG/DOSE AEPB Inhale 1 puff into the lungs 2 (two) times daily.   Yes [provider]  hydrALAZINE (APRESOLINE) 10 MG tablet Take 1 tablet (10 mg total) by mouth 3 (three) times daily. Patient taking differently: Take 50 mg by mouth 3 (three) times daily.  12/13/18  Yes Wieting, Richard, MD  HYDROcodone-acetaminophen (NORCO/VICODIN) 5-325 MG tablet Take 1 tablet by mouth 2 (two) times daily as needed for moderate pain or severe pain.    Yes [provider]  levothyroxine (SYNTHROID, LEVOTHROID) 50 MCG tablet Take 50 mcg by mouth daily before breakfast.   Yes [provider]  loratadine (CLARITIN) 10 MG tablet Take 10 mg by mouth daily.   Yes [provider]  losartan (COZAAR) 50 MG tablet Take 2 tablets (100 mg total) by mouth at bedtime. 12/13/18  Yes Wieting, Richard, MD  magnesium oxide (MAG-OX) 400 MG tablet Take 400 mg by mouth 2 (two) times daily.   Yes [provider]  meclizine (ANTIVERT) 25 MG tablet Take 1 tablet (25 mg total) by mouth 2 (two) times daily as needed for dizziness. 09/16/18  Yes Merlyn Lot, MD  meloxicam (MOBIC) 15 MG tablet Take 15 mg by mouth daily. 02/07/19  Yes [provider]  mometasone-formoterol (DULERA) 100-5 MCG/ACT AERO Inhale 2 puffs into the lungs 2 (two) times daily.   Yes [provider]  polyethylene glycol (MIRALAX / GLYCOLAX) 17 g packet Take 17 g by mouth daily. 01/03/19  Yes Earleen Newport, MD    Family History  Problem Relation Age of Onset  . Hypertension Mother   . Heart disease Mother   . Cancer Mother   . Stroke Father   . Hypertension Father   . Breast cancer Sister      Social History   Tobacco Use  . Smoking status: Never Smoker  . Smokeless tobacco: Never Used  Substance Use Topics  . Alcohol use: No    Alcohol/week:  0.0 standard drinks  . Drug use: No    Allergies as of 02/17/2019 - Review Complete 02/17/2019  Allergen Reaction Noted  . Diphenhydramine hcl Rash 11/09/2017  . Penicillins Hives 04/08/2015  . Captopril Other (See Comments) 04/08/2015  . Enalapril maleate Other (See Comments) 04/08/2015  . Tape Itching 04/08/2015  . Terfenadine Other (See Comments) 12/20/2013  . Augmentin [amoxicillin-pot clavulanate] Rash 01/15/2015  . Biaxin [clarithromycin] Rash, Other (See Comments), and Hives 01/15/2015    Review of Systems:    All systems reviewed and negative except where noted in HPI.   Physical Exam:  Vital signs in last 24 hours: Temp:  [97.6 F (36.4 C)-98.9 F (37.2 C)] 98.1 F (36.7 C) (07/04 0526) Pulse Rate:  [58-74] 58 (07/04 0526) Resp:  [17-20] 20 (07/04 0526) BP: (138-178)/(70-99) 148/99 (07/04 0820) SpO2:  [95 %-100 %] 95 % (07/04 0526) Weight:  [72.4 kg] 72.4 kg (07/03 1731) Last BM Date: 02/17/19 General:   Pleasant, cooperative in NAD Head:  Normocephalic and atraumatic. Eyes:   No icterus.   Conjunctiva pink. PERRLA. Ears:  Normal auditory acuity. Neck:  Supple; no masses or thyroidomegaly Lungs: Respirations even and unlabored. Lungs clear to auscultation bilaterally.   No wheezes, crackles, or rhonchi.  Heart:  Regular rate and rhythm;  Without murmur,  clicks, rubs or gallops Abdomen:  Soft, nondistended, nontender. Normal bowel sounds. No appreciable masses or hepatomegaly.  No rebound or guarding.  Rectal:  Not performed. Msk:  Symmetrical without gross deformities.    Extremities:  Without edema, cyanosis or clubbing. Neurologic:  Alert and oriented x3;  grossly normal neurologically. Skin:  Intact without significant lesions or rashes. Cervical Nodes:  No significant cervical adenopathy. Psych:  Alert and cooperative. Normal affect.  LAB RESULTS: Recent Labs    02/17/19 1110  02/17/19 2158 02/18/19 0414 02/18/19 1020  WBC 6.9  --   --   --   --   HGB 12.3   < > 11.7* 11.2* 10.7*  HCT 36.4   < > 35.2* 33.8* 31.6*  PLT 279  --   --   --   --    < > = values in this interval not displayed.   BMET Recent Labs    02/17/19 1205 02/18/19 0414  NA 132* 135  K 3.9 4.1  CL 97* 100  CO2 25 26  GLUCOSE 95 112*  BUN 10 9  CREATININE 0.79 0.82  CALCIUM 9.2 8.5*   LFT No results for input(s): PROT, ALBUMIN, AST, ALT, ALKPHOS, BILITOT, BILIDIR, IBILI in the last 72 hours. PT/INR Recent Labs    02/17/19 1205  LABPROT 12.3  INR 0.9    STUDIES: No results found.    Impression / Plan:   Assessment: Active Problems:   GI bleed   LATAMARA MELDER is a 85 y.o. y/o female with dark stools while on iron and a drop in her hemoglobin.  The patient was admitted with normal hemoglobin and hematocrit but it has trended down.  The patient denies any NSAID use.  She reports that the main reason she came in was because she was feeling weak and tired.  The patient had a colonoscopy and upper endoscopy in October 2019 with a colonoscopy being terminated due to a large amount of stool in the colon.  Plan:  This patient has had black stools with a drop in hemoglobin and has some upper abdominal pain with gas.  The patient should undergo an upper endoscopy because of  her symptoms and decreased hemoglobin.  I will plan to set the patient  up for an upper endoscopy for Monday if she is still in the hospital otherwise she should contact Dr. Percell [place] office for follow-up.  I have explained to the patient that since her procedure is not urgent and she is stable that we can perform this on Monday if she is still in the hospital.  The patient states she agrees with the plan and understands it.  Thank you for involving me in the care of this patient.      LOS: 0 days   Janice Lame, MD  02/18/2019, 11:21 AM    Note: This dictation was prepared with Dragon dictation along with smaller phrase technology. Any transcriptional errors that result from this process are unintentional.

## 2019-02-19 LAB — CBC
HCT: 30.6 % — ABNORMAL LOW (ref 36.0–46.0)
Hemoglobin: 10.3 g/dL — ABNORMAL LOW (ref 12.0–15.0)
MCH: 31.1 pg (ref 26.0–34.0)
MCHC: 33.7 g/dL (ref 30.0–36.0)
MCV: 92.4 fL (ref 80.0–100.0)
Platelets: 243 10*3/uL (ref 150–400)
RBC: 3.31 MIL/uL — ABNORMAL LOW (ref 3.87–5.11)
RDW: 13.2 % (ref 11.5–15.5)
WBC: 5 10*3/uL (ref 4.0–10.5)
nRBC: 0 % (ref 0.0–0.2)

## 2019-02-19 LAB — BASIC METABOLIC PANEL
Anion gap: 7 (ref 5–15)
BUN: 8 mg/dL (ref 8–23)
CO2: 23 mmol/L (ref 22–32)
Calcium: 8.3 mg/dL — ABNORMAL LOW (ref 8.9–10.3)
Chloride: 102 mmol/L (ref 98–111)
Creatinine, Ser: 0.91 mg/dL (ref 0.44–1.00)
GFR calc Af Amer: >60 mL/min (ref >60)
GFR calc non Af Amer: 58 mL/min — ABNORMAL LOW (ref >60)
Glucose, Bld: 108 mg/dL — ABNORMAL HIGH (ref 70–99)
Potassium: 3.7 mmol/L (ref 3.5–5.1)
Sodium: 132 mmol/L — ABNORMAL LOW (ref 135–145)

## 2019-02-19 MED ORDER — PEG 3350-KCL-NA BICARB-NACL 420 G PO SOLR
4000.0000 mL | Freq: Once | ORAL | Status: AC
  Administered 2019-02-19: 4000 mL via ORAL
  Filled 2019-02-19: qty 4000

## 2019-02-19 MED ORDER — ALPRAZOLAM 0.5 MG PO TABS
0.5000 mg | ORAL_TABLET | Freq: Every day | ORAL | Status: DC
  Administered 2019-02-19 – 2019-02-21 (×3): 0.5 mg via ORAL
  Filled 2019-02-19 (×2): qty 1

## 2019-02-19 MED ORDER — ALPRAZOLAM 0.25 MG PO TABS
0.2500 mg | ORAL_TABLET | Freq: Every day | ORAL | Status: DC
  Administered 2019-02-19 – 2019-02-20 (×2): 0.25 mg via ORAL
  Filled 2019-02-19 (×2): qty 1

## 2019-02-19 NOTE — Progress Notes (Signed)
Janice Lame, MD [facility]   [address] [place], [place] [address] Phone: [phone] Fax : [phone]   Subjective: The patient has had a slight decrease in her hemoglobin overnight but significant drop since admission.  The patient has not had a colonoscopy in some time with her last colonoscopy being obstructed by solid stools.  The patient states that she took MiraLAX and Gatorade prep last time and it appears that it did not work.  The patient denies any abdominal pain at the present time.   Objective: Vital signs in last 24 hours: Vitals:   02/18/19 1255 02/18/19 1708 02/18/19 2104 02/19/19 0507  BP: 128/74 131/80 (!) 151/69 130/62  Pulse: 65  70 (!) 54  Resp: (!) 24  18 18   Temp: 98.3 F (36.8 C)  97.8 F (36.6 C) 98.3 F (36.8 C)  TempSrc: Oral  Oral Oral  SpO2: 98%  99% 94%  Weight:      Height:       Weight change:   Intake/Output Summary (Last 24 hours) at 02/19/2019 0919 Last data filed at 02/19/2019 0507 Gross per 24 hour  Intake 600 ml  Output 0 ml  Net 600 ml     Exam: Heart:: Regular rate and rhythm, S1S2 present or without murmur or extra heart sounds Lungs: normal and clear to auscultation and percussion Abdomen: soft, nontender, normal bowel sounds   Lab Results: @LABTEST2 @ Micro Results: Recent Results (from the past 240 hour(s))  SARS Coronavirus 2 (CEPHEID - Performed in [place] hospital lab), Hosp Order     Status: None   Collection Time: 02/17/19 11:29 AM   Specimen: Nasopharyngeal Swab  Result Value Ref Range Status   SARS Coronavirus 2 NEGATIVE NEGATIVE Final    Comment: (NOTE) If result is NEGATIVE SARS-CoV-2 target nucleic acids are NOT DETECTED. The SARS-CoV-2 RNA is generally detectable in upper and lower  respiratory specimens during the acute phase of infection. The lowest  concentration of SARS-CoV-2 viral copies this assay can detect is 250  copies / mL. A negative result does not preclude SARS-CoV-2 infection   and should not be used as the sole basis for treatment or other  patient management decisions.  A negative result may occur with  improper specimen collection / handling, submission of specimen other  than nasopharyngeal swab, presence of viral mutation(s) within the  areas targeted by this assay, and inadequate number of viral copies  (<250 copies / mL). A negative result must be combined with clinical  observations, patient history, and epidemiological information. If result is POSITIVE SARS-CoV-2 target nucleic acids are DETECTED. The SARS-CoV-2 RNA is generally detectable in upper and lower  respiratory specimens dur ing the acute phase of infection.  Positive  results are indicative of active infection with SARS-CoV-2.  Clinical  correlation with patient history and other diagnostic information is  necessary to determine patient infection status.  Positive results do  not rule out bacterial infection or co-infection with other viruses. If result is PRESUMPTIVE POSTIVE SARS-CoV-2 nucleic acids MAY BE PRESENT.   A presumptive positive result was obtained on the submitted specimen  and confirmed on repeat testing.  While 2019 novel coronavirus  (SARS-CoV-2) nucleic acids may be present in the submitted sample  additional confirmatory testing may be necessary for epidemiological  and / or clinical management purposes  to differentiate between  SARS-CoV-2 and other Sarbecovirus currently known to infect humans.  If clinically indicated additional testing with an alternate test  methodology (  [id number]) is advised. The SARS-CoV-2 RNA is generally  detectable in upper and lower respiratory sp ecimens during the acute  phase of infection. The expected result is Negative. Fact Sheet for Patients:  StrictlyIdeas.no Fact Sheet for Healthcare Providers: BankingDealers.co.za This test is not yet approved or cleared by the [place] FDA and has  been authorized for detection and/or diagnosis of SARS-CoV-2 by FDA under an Emergency Use Authorization (EUA).  This EUA will remain in effect (meaning this test can be used) for the duration of the COVID-19 declaration under Section 564(b)(1) of the Act, 21 U.S.C. section 360bbb-3(b)(1), unless the authorization is terminated or revoked sooner. Performed at [facility], [address] [place], [place] [address]    Studies/Results: No results found. Medications: I have reviewed the patient's current medications. Scheduled Meds: . atorvastatin  20 mg Oral QHS  . busPIRone  10 mg Oral BID  . cholecalciferol  1,000 Units Oral Daily  . clindamycin  300 mg Oral QID  . diltiazem  120 mg Oral Daily  . escitalopram  5 mg Oral Daily  . ferrous sulfate  325 mg Oral Q breakfast  . hydrALAZINE  10 mg Oral TID  . levothyroxine  50 mcg Oral QAC breakfast  . loratadine  10 mg Oral Daily  . losartan  100 mg Oral QHS  . magnesium oxide  400 mg Oral BID  . mometasone-formoterol  2 puff Inhalation BID  . pantoprazole (PROTONIX) IV  40 mg Intravenous Q12H  . polyethylene glycol-electrolytes  4,000 mL Oral Once   Continuous Infusions: . sodium chloride 75 mL/hr at 02/18/19 2107   PRN Meds:.acetaminophen **OR** acetaminophen, albuterol, ALPRAZolam, HYDROcodone-acetaminophen, ondansetron **OR** ondansetron (ZOFRAN) IV   Assessment: Active Problems:   GI bleed   Melena    Plan: This patient has a GI bleed and has a history of colon polyps with a drop in her hemoglobin since admission.  The patient has also had black stools.  The patient will be set up for an EGD and colonoscopy for tomorrow.  The patient will be put on clear liquids a day and will take a prep today for her EGD and colonoscopy tomorrow. I have discussed risks & benefits which include, but are not limited to, bleeding, infection, perforation & drug reaction.  The patient agrees with this plan & written consent will be  obtained.      LOS: 1 day   Janice Perkins 02/19/2019, 9:19 AM

## 2019-02-19 NOTE — TOC Initial Note (Signed)
Transition of Care [facility]) - Initial/Assessment Note    Patient Details  Name: Janice Perkins MRN: [id number] Date of Birth: 1932-10-11  Transition of Care [facility]) CM/SW Contact:    Latanya Maudlin, RN Phone Number: 02/19/2019, 11:54 AM  Clinical Narrative:   TOC consulted to complete high risk readmission assessment. Patient lives with her spouse. She reports that she is independent at baseline. Patient has a BSC, RW and rollator but states she rarely has to use them. Spouse and adult children provide transport. Uses [facility] and received Eliquis voucher last admission. Reports no needs.                  Expected Discharge Plan: Home/Self Care Barriers to Discharge: Continued Medical Work up   Patient Goals and CMS Choice        Expected Discharge Plan and Services Expected Discharge Plan: Home/Self Care   Discharge Planning Services: CM Consult   Living arrangements for the past 2 months: Single Family Home Expected Discharge Date: 02/18/19                                    Prior Living Arrangements/Services Living arrangements for the past 2 months: Single Family Home Lives with:: Spouse              Current home services: DME    Activities of Daily Living Home Assistive Devices/Equipment: None ADL Screening (condition at time of admission) Patient's cognitive ability adequate to safely complete daily activities?: Yes Is the patient deaf or have difficulty hearing?: No Does the patient have difficulty seeing, even when wearing glasses/contacts?: No Does the patient have difficulty concentrating, remembering, or making decisions?: No Patient able to express need for assistance with ADLs?: Yes Does the patient have difficulty dressing or bathing?: No Independently performs ADLs?: Yes (appropriate for developmental age) Does the patient have difficulty walking or climbing stairs?: No Weakness of Legs: None Weakness of Arms/Hands: None  Permission  Sought/Granted                  Emotional Assessment              Admission diagnosis:  Melena [K92.1] Patient Active Problem List   Diagnosis Date Noted  . Melena   . GI bleed 02/17/2019  . Hypertensive encephalopathy 12/11/2018  . Hypoxia 05/30/2018  . Leukocytosis 04/05/2018  . Pneumonia 11/15/2017  . Pleural effusion 05/16/2017  . AF (paroxysmal atrial fibrillation) ([place]) 05/16/2017  . Breast cancer ([place]) 05/16/2017  . Dependence on nocturnal oxygen therapy 05/16/2017  . HTN (hypertension) 05/16/2017  . Generalized weakness 03/07/2017  . Hyponatremia 03/07/2017  . Dehydration 03/07/2017  . UTI (urinary tract infection) 03/07/2017  . HCAP (healthcare-associated pneumonia) 01/19/2017  . AKI (acute kidney injury) ([place]) 12/18/2016  . Primary cancer of upper outer quadrant of right female breast ([place]) 04/07/2016  . Lumbar radiculopathy 03/23/2016  . Spinal stenosis, lumbar region, with neurogenic claudication 03/23/2016  . DDD (degenerative disc disease), lumbar 01/14/2015  . Lumbosacral facet joint syndrome 01/14/2015  . Sacroiliac joint dysfunction 01/14/2015  . Greater trochanteric bursitis 01/14/2015  . Bilateral occipital neuralgia 01/14/2015   PCP:  [name], MD Pharmacy:   [place], [place] - [address] [place] [address] Phone: [phone] Fax: [phone]     Social Determinants of Health (SDOH) Interventions  Readmission Risk Interventions Readmission Risk Prevention Plan 02/19/2019  Transportation Screening Complete  Medication Review Press photographer) Complete  HRI or Home Care Consult Complete  Some recent data might be hidden

## 2019-02-19 NOTE — Progress Notes (Signed)
MD notified: The patient wants to know if she can get xanax 0.5mg  schedule once a day for the morning  as she does at home and 0.25mg  as needed for sleep.

## 2019-02-19 NOTE — Plan of Care (Addendum)
Bowel prep given. Watery stools. Output starting to still has small solid pieces. IV fluids provided. NPO after midnight for EGD and colonoscopy tomorrow. No falls. Bed alarm on due to admission diagnosis of GI bleed. IV fluids running.  Problem: Education: Goal: Knowledge of General Education information will improve Description: Including pain rating scale, medication(s)/side effects and non-pharmacologic comfort measures Outcome: Progressing   Problem: Health Behavior/Discharge Planning: Goal: Ability to manage health-related needs will improve Outcome: Progressing   Problem: Clinical Measurements: Goal: Ability to maintain clinical measurements within normal limits will improve Outcome: Progressing Goal: Will remain free from infection Outcome: Progressing Goal: Diagnostic test results will improve Outcome: Progressing Goal: Respiratory complications will improve Outcome: Progressing Goal: Cardiovascular complication will be avoided Outcome: Progressing   Problem: Activity: Goal: Risk for activity intolerance will decrease Outcome: Progressing   Problem: Nutrition: Goal: Adequate nutrition will be maintained Outcome: Progressing   Problem: Coping: Goal: Level of anxiety will decrease Outcome: Progressing   Problem: Elimination: Goal: Will not experience complications related to bowel motility Outcome: Progressing Goal: Will not experience complications related to urinary retention Outcome: Progressing   Problem: Pain Managment: Goal: General experience of comfort will improve Outcome: Progressing   Problem: Safety: Goal: Ability to remain free from injury will improve Outcome: Progressing   Problem: Skin Integrity: Goal: Risk for impaired skin integrity will decrease Outcome: Progressing   Problem: Education: Goal: Knowledge of General Education information will improve Description: Including pain rating scale, medication(s)/side effects and non-pharmacologic  comfort measures Outcome: Progressing   Problem: Health Behavior/Discharge Planning: Goal: Ability to manage health-related needs will improve Outcome: Progressing   Problem: Clinical Measurements: Goal: Ability to maintain clinical measurements within normal limits will improve Outcome: Progressing Goal: Will remain free from infection Outcome: Progressing Goal: Diagnostic test results will improve Outcome: Progressing Goal: Respiratory complications will improve Outcome: Progressing Goal: Cardiovascular complication will be avoided Outcome: Progressing   Problem: Activity: Goal: Risk for activity intolerance will decrease Outcome: Progressing   Problem: Nutrition: Goal: Adequate nutrition will be maintained Outcome: Progressing   Problem: Coping: Goal: Level of anxiety will decrease Outcome: Progressing   Problem: Elimination: Goal: Will not experience complications related to bowel motility Outcome: Progressing Goal: Will not experience complications related to urinary retention Outcome: Progressing   Problem: Pain Managment: Goal: General experience of comfort will improve Outcome: Progressing   Problem: Safety: Goal: Ability to remain free from injury will improve Outcome: Progressing   Problem: Skin Integrity: Goal: Risk for impaired skin integrity will decrease Outcome: Progressing

## 2019-02-19 NOTE — Progress Notes (Signed)
[facility] [place] NAME: Janice Perkins    MR#:  [id number]  DATE OF BIRTH:  1933-05-12  SUBJECTIVE:  CHIEF COMPLAINT:   Chief Complaint  Patient presents with  . multiple medical complaints  Patient seen and evaluated today Has some black stools No vomiting of blood No abdominal pain Hemoglobin maintained around 10  REVIEW OF SYSTEMS:    ROS  CONSTITUTIONAL: No documented fever. Has fatigue, weakness. No weight gain, no weight loss.  EYES: No blurry or double vision.  ENT: No tinnitus. No postnasal drip. No redness of the oropharynx.  RESPIRATORY: No cough, no wheeze, no hemoptysis. No dyspnea.  CARDIOVASCULAR: No chest pain. No orthopnea. No palpitations. No syncope.  GASTROINTESTINAL: No nausea, no vomiting or diarrhea. No abdominal pain. Has melena. GENITOURINARY: No dysuria or hematuria.  ENDOCRINE: No polyuria or nocturia. No heat or cold intolerance.  HEMATOLOGY: No anemia. No bruising. No bleeding.  INTEGUMENTARY: No rashes. No lesions.  MUSCULOSKELETAL: No arthritis. No swelling. No gout.  NEUROLOGIC: No numbness, tingling, or ataxia. No seizure-type activity.  PSYCHIATRIC: No anxiety. No insomnia. No ADD.   DRUG ALLERGIES:   Allergies  Allergen Reactions  . Diphenhydramine Hcl Rash  . Penicillins Hives    .Has patient had a PCN reaction causing immediate rash, facial/tongue/throat swelling, SOB or lightheadedness with hypotension: Unknown Has patient had a PCN reaction causing severe rash involving mucus membranes or skin necrosis: Unknown Has patient had a PCN reaction that required hospitalization: Unknown Has patient had a PCN reaction occurring within the last 10 years: Unknown If all of the above answers are "NO", then may proceed with Cephalosporin use.   . Captopril Other (See Comments)  . Eliquis [Apixaban] Other (See Comments)    Stomach bleed  . Enalapril Maleate Other (See Comments)    Other reaction(s):  Unknown  . Tape Itching  . Terfenadine Other (See Comments)  . Augmentin [Amoxicillin-Pot Clavulanate] Rash    Has patient had a PCN reaction causing immediate rash, facial/tongue/throat swelling, SOB or lightheadedness with hypotension: Unknown Has patient had a PCN reaction causing severe rash involving mucus membranes or skin necrosis: Unknown Has patient had a PCN reaction that required hospitalization: Unknown Has patient had a PCN reaction occurring within the last 10 years: Unknown If all of the above answers are "NO", then may proceed with Cephalosporin use.  . Biaxin [Clarithromycin] Rash, Other (See Comments) and Hives    Other reaction(s): Unknown    VITALS:  Blood pressure 130/62, pulse (!) 54, temperature 98.3 F (36.8 C), temperature source Oral, resp. rate 18, height 5' (1.524 m), weight 72.4 kg, SpO2 94 %.  PHYSICAL EXAMINATION:   Physical Exam  GENERAL:  85 y.o.-year-old patient lying in the bed with no acute distress.  EYES: Pupils equal, round, reactive to light and accommodation. No scleral icterus. Extraocular muscles intact.  HEENT: Head atraumatic, normocephalic. Oropharynx and nasopharynx clear.  NECK:  Supple, no jugular venous distention. No thyroid enlargement, no tenderness.  LUNGS: Normal breath sounds bilaterally, no wheezing, rales, rhonchi. No use of accessory muscles of respiration.  CARDIOVASCULAR: S1, S2 normal. No murmurs, rubs, or gallops.  ABDOMEN: Soft, nontender, nondistended. Bowel sounds present. No organomegaly or mass.  EXTREMITIES: No cyanosis, clubbing or edema b/l.    NEUROLOGIC: Cranial nerves II through XII are intact. No focal Motor or sensory deficits b/l.   PSYCHIATRIC: The patient is alert and oriented x 3.  SKIN: No obvious rash, lesion, or  ulcer.   LABORATORY PANEL:   CBC Recent Labs  Lab 02/19/19 0508  WBC 5.0  HGB 10.3*  HCT 30.6*  PLT 243    ------------------------------------------------------------------------------------------------------------------ Chemistries  Recent Labs  Lab 02/19/19 0508  NA 132*  K 3.7  CL 102  CO2 23  GLUCOSE 108*  BUN 8  CREATININE 0.91  CALCIUM 8.3*   ------------------------------------------------------------------------------------------------------------------  Cardiac Enzymes No results for input(s): TROPONINI in the last 168 hours. ------------------------------------------------------------------------------------------------------------------  RADIOLOGY:  No results found.   ASSESSMENT AND PLAN:   85 year old female patient with history of chronic atrial fibrillation on Eliquis for anticoagulation, hypertension, COPD, breast cancer currently under hospitalist service  -Acute gastrointestinal bleeding Hold Eliquis for now Appreciate gastroenterology evaluation Due for endoscopy and colonoscopy in a.m. Monitor hemoglobin hematocrit Current hemoglobin 10.7 If hemoglobin falls less than 7 blood transfusion IV Protonix 40 MG 12 hourly Bowel prep today N.p.o. after midnight  -Acute blood loss anemia Secondary to GI loss Monitor hemoglobin hematocrit closely Currently greater than 10  -Atrial fibrillation chronic Continue diltiazem for rate control  -Epistaxis has resolved  -Emphysema stable  -COVID-19 test negative  -DVT prophylaxis sequential compression device to lower extremities  All the records are reviewed and case discussed with Care Management/Social Worker. Management plans discussed with the patient, family and they are in agreement.  CODE STATUS: Full code  DVT Prophylaxis: SCDs  TOTAL TIME TAKING CARE OF THIS PATIENT: 35 minutes.   POSSIBLE D/C IN 2 to 3 DAYS, DEPENDING ON CLINICAL CONDITION.  Saundra Shelling M.D on 02/19/2019 at 10:07 AM  Between 7am to 6pm - Pager - [phone]  After 6pm go to [email or web address] - password [email or web address] [facility]  Office  [phone]  CC: Primary care physician; [name], MD  Note: This dictation was prepared with Dragon dictation along with smaller phrase technology. Any transcriptional errors that result from this process are unintentional.

## 2019-02-20 ENCOUNTER — Encounter
Admission: EM | Disposition: A | Discharge status: Home / Self Care | Payer: Self-pay | Source: Home / Self Care | Attending: Internal Medicine

## 2019-02-20 ENCOUNTER — Inpatient Hospital Stay: Payer: Medicare Other | Admitting: Registered Nurse

## 2019-02-20 DIAGNOSIS — K269 Duodenal ulcer, unspecified as acute or chronic, without hemorrhage or perforation: Secondary | ICD-10-CM

## 2019-02-20 HISTORY — PX: ESOPHAGOGASTRODUODENOSCOPY (EGD) WITH PROPOFOL: SHX5813

## 2019-02-20 HISTORY — PX: COLONOSCOPY WITH PROPOFOL: SHX5780

## 2019-02-20 LAB — CBC
HCT: 32.6 % — ABNORMAL LOW (ref 36.0–46.0)
Hemoglobin: 11 g/dL — ABNORMAL LOW (ref 12.0–15.0)
MCH: 30.9 pg (ref 26.0–34.0)
MCHC: 33.7 g/dL (ref 30.0–36.0)
MCV: 91.6 fL (ref 80.0–100.0)
Platelets: 252 10*3/uL (ref 150–400)
RBC: 3.56 MIL/uL — ABNORMAL LOW (ref 3.87–5.11)
RDW: 13.2 % (ref 11.5–15.5)
WBC: 6 10*3/uL (ref 4.0–10.5)
nRBC: 0 % (ref 0.0–0.2)

## 2019-02-20 SURGERY — ESOPHAGOGASTRODUODENOSCOPY (EGD) WITH PROPOFOL
Anesthesia: General

## 2019-02-20 MED ORDER — FLEET ENEMA 7-19 GM/118ML RE ENEM
1.0000 | ENEMA | Freq: Once | RECTAL | Status: AC
  Administered 2019-02-20: 1 via RECTAL

## 2019-02-20 MED ORDER — PROPOFOL 500 MG/50ML IV EMUL
INTRAVENOUS | Status: DC | PRN
  Administered 2019-02-20: 120 ug/kg/min via INTRAVENOUS

## 2019-02-20 MED ORDER — PROPOFOL 10 MG/ML IV BOLUS
INTRAVENOUS | Status: DC | PRN
  Administered 2019-02-20: 50 mg via INTRAVENOUS

## 2019-02-20 NOTE — Anesthesia Post-op Follow-up Note (Signed)
Anesthesia QCDR form completed.        

## 2019-02-20 NOTE — Anesthesia Preprocedure Evaluation (Signed)
Anesthesia Evaluation  Patient identified by MRN, date of birth, ID band Patient awake    Reviewed: Allergy & Precautions, NPO status , Patient's Chart, lab work & pertinent test results  History of Anesthesia Complications Negative for: history of anesthetic complications  Airway Mallampati: III       Dental  (+) Upper Dentures, Lower Dentures   Pulmonary asthma , COPD,  COPD inhaler,           Cardiovascular hypertension, Pt. on medications (-) Past MI and (-) CHF + dysrhythmias Atrial Fibrillation      Neuro/Psych    GI/Hepatic Neg liver ROS, GERD  Medicated and Controlled,  Endo/Other  neg diabetesHypothyroidism   Renal/GU negative Renal ROS     Musculoskeletal   Abdominal   Peds  Hematology   Anesthesia Other Findings   Reproductive/Obstetrics                             Anesthesia Physical Anesthesia Plan  ASA: III  Anesthesia Plan: General   Post-op Pain Management:    Induction: Intravenous  PONV Risk Score and Plan: 3 and Propofol infusion, TIVA and Treatment may vary due to age or medical condition  Airway Management Planned: Nasal Cannula  Additional Equipment:   Intra-op Plan:   Post-operative Plan:   Informed Consent: I have reviewed the patients History and Physical, chart, labs and discussed the procedure including the risks, benefits and alternatives for the proposed anesthesia with the patient or authorized representative who has indicated his/her understanding and acceptance.       Plan Discussed with:   Anesthesia Plan Comments:         Anesthesia Quick Evaluation

## 2019-02-20 NOTE — Anesthesia Postprocedure Evaluation (Signed)
Anesthesia Post Note  Patient: Janice Perkins  Procedure(s) Performed: ESOPHAGOGASTRODUODENOSCOPY (EGD) WITH PROPOFOL (N/A ) COLONOSCOPY WITH PROPOFOL (N/A )  Patient location during evaluation: Endoscopy Anesthesia Type: General Level of consciousness: awake and alert Pain management: pain level controlled Vital Signs Assessment: post-procedure vital signs reviewed and stable Respiratory status: spontaneous breathing and respiratory function stable Cardiovascular status: stable Anesthetic complications: no     Last Vitals:  Vitals:   02/20/19 1215 02/20/19 1225  BP: 130/64 (!) 153/96  Pulse: 81 77  Resp: 16 16  Temp: 36.7 C   SpO2: 99% 100%    Last Pain:  Vitals:   02/20/19 1225  TempSrc:   PainSc: 0-No pain                 KEPHART,WILLIAM K

## 2019-02-20 NOTE — Op Note (Signed)
[facility] Gastroenterology Patient Name: Janice Perkins Procedure Date: 02/20/2019 11:40 AM MRN: [id number] Account #: [id number] Date of Birth: May 13, 1933 Admit Type: Inpatient Age: 85 Room: [facility] ENDO ROOM 4 Gender: Female Note Status: Finalized Procedure:            Colonoscopy Indications:          Melena Providers:            Lucilla Lame MD, MD Referring MD:         [name], MD (Referring MD) Medicines:            Propofol per Anesthesia Complications:        No immediate complications. Procedure:            Pre-Anesthesia Assessment:                       - Prior to the procedure, a History and Physical was                        performed, and patient medications and allergies were                        reviewed. The patient's tolerance of previous                        anesthesia was also reviewed. The risks and benefits of                        the procedure and the sedation options and risks were                        discussed with the patient. All questions were                        answered, and informed consent was obtained. Prior                        Anticoagulants: The patient has taken no previous                        anticoagulant or antiplatelet agents. ASA Grade                        Assessment: II - A patient with mild systemic disease.                        After reviewing the risks and benefits, the patient was                        deemed in satisfactory condition to undergo the                        procedure.                       After obtaining informed consent, the colonoscope was                        passed under direct vision. Throughout the procedure,  the patient's blood pressure, pulse, and oxygen                        saturations were monitored continuously. The                        Colonoscope was introduced through the anus and                        advanced to the the cecum,  identified by appendiceal                        orifice and ileocecal valve. The colonoscopy was                        performed without difficulty. The patient tolerated the                        procedure well. The quality of the bowel preparation                        was excellent. Findings:      The perianal and digital rectal examinations were normal.      Non-bleeding internal hemorrhoids were found during retroflexion. The       hemorrhoids were Grade II (internal hemorrhoids that prolapse but reduce       spontaneously). Impression:           - Non-bleeding internal hemorrhoids.                       - No specimens collected.                       - The patient most likely had bleed from an ENT source. Recommendation:       - Return patient to hospital ward for ongoing care.                       - Resume previous diet.                       - Continue present medications. Procedure Code(s):    --- Professional ---                       [phone], Colonoscopy, flexible; diagnostic, including                        collection of specimen(s) by brushing or washing, when                        performed (separate procedure) Diagnosis Code(s):    --- Professional ---                       K92.1, Melena (includes Hematochezia) CPT copyright 2019 American Medical Association. All rights reserved. The codes documented in this report are preliminary and upon coder review may  be revised to meet current compliance requirements. Lucilla Lame MD, MD 02/20/2019 12:13:10 PM This report has been signed electronically. Number of Addenda: 0 Note Initiated On: 02/20/2019 11:40 AM Scope Withdrawal Time: 0 hours 4 minutes 42 seconds  Total Procedure Duration: 0 hours 14  minutes 2 seconds  Estimated Blood Loss: Estimated blood loss: none.      [facility]

## 2019-02-20 NOTE — Transfer of Care (Signed)
Immediate Anesthesia Transfer of Care Note  Patient: Janice Perkins  Procedure(s) Performed: ESOPHAGOGASTRODUODENOSCOPY (EGD) WITH PROPOFOL (N/A ) COLONOSCOPY WITH PROPOFOL (N/A )  Patient Location: PACU  Anesthesia Type:General  Level of Consciousness: drowsy  Airway & Oxygen Therapy: Patient Spontanous Breathing and Patient connected to nasal cannula oxygen  Post-op Assessment: Report given to RN and Post -op Vital signs reviewed and stable  Post vital signs: Reviewed and stable  Last Vitals:  Vitals Value Taken Time  BP    Temp    Pulse    Resp    SpO2      Last Pain:  Vitals:   02/20/19 0415  TempSrc: Oral  PainSc:       Patients Stated Pain Goal: 0 (89/37/34 2876)  Complications: No apparent anesthesia complications

## 2019-02-20 NOTE — Op Note (Signed)
[facility] Patient Name: Janice Perkins Procedure Date: 02/20/2019 11:41 AM MRN: [id number] Account #: [id number] Date of Birth: 1933/04/03 Admit Type: Inpatient Age: 85 Room: [facility] ENDO ROOM 4 Gender: Female Note Status: Finalized Procedure:            Upper GI endoscopy Indications:          Iron deficiency anemia, Melena Providers:            Lucilla Lame MD, MD Referring MD:         [name], MD (Referring MD) Medicines:            Propofol per Anesthesia Complications:        No immediate complications. Procedure:            Pre-Anesthesia Assessment:                       - Prior to the procedure, a History and Physical was                        performed, and patient medications and allergies were                        reviewed. The patient's tolerance of previous                        anesthesia was also reviewed. The risks and benefits of                        the procedure and the sedation options and risks were                        discussed with the patient. All questions were                        answered, and informed consent was obtained. Prior                        Anticoagulants: The patient has taken no previous                        anticoagulant or antiplatelet agents. ASA Grade                        Assessment: II - A patient with mild systemic disease.                        After reviewing the risks and benefits, the patient was                        deemed in satisfactory condition to undergo the                        procedure.                       After obtaining informed consent, the endoscope was                        passed under direct vision. Throughout the procedure,  the patient's blood pressure, pulse, and oxygen                        saturations were monitored continuously. The Endoscope                        was introduced through the mouth, and advanced to the                 second part of duodenum. The upper GI endoscopy was                        accomplished without difficulty. The patient tolerated                        the procedure well. Findings:      Clotted blood was found at the cricopharyngeus.      LA Grade C (one or more mucosal breaks continuous between tops of 2 or       more mucosal folds, less than 75% circumference) esophagitis with no       bleeding was found in the lower third of the esophagus.      Clotted blood was found in the gastric body.      One non-bleeding linear duodenal ulcer with no stigmata of bleeding was       found in the duodenal bulb. The lesion was 2 mm in largest dimension. Impression:           - Blood in oropharynx                       - Clotted blood at the cricopharyngeus.                       - LA Grade C reflux esophagitis.                       - Clotted blood in the gastric body.                       - Non-bleeding duodenal ulcer with no stigmata of                        bleeding.                       - No specimens collected. Recommendation:       - Return patient to hospital ward for ongoing care.                       - Resume previous diet.                       - Continue present medications.                       - Perform a colonoscopy today.                       - Refer to an ENT specialist. Procedure Code(s):    --- Professional ---                       [phone], Esophagogastroduodenoscopy, flexible, transoral;  diagnostic, including collection of specimen(s) by                        brushing or washing, when performed (separate procedure) Diagnosis Code(s):    --- Professional ---                       D50.9, Iron deficiency anemia, unspecified                       K92.1, Melena (includes Hematochezia)                       K26.9, Duodenal ulcer, unspecified as acute or chronic,                        without hemorrhage or perforation CPT copyright 2019  American Medical Association. All rights reserved. The codes documented in this report are preliminary and upon coder review may  be revised to meet current compliance requirements. Lucilla Lame MD, MD 02/20/2019 11:54:24 AM This report has been signed electronically. Number of Addenda: 0 Note Initiated On: 02/20/2019 11:41 AM Estimated Blood Loss: Estimated blood loss: none.      [facility]

## 2019-02-20 NOTE — Progress Notes (Signed)
Janice Perkins NAME: Janice Perkins    MR#:  [id number]  DATE OF BIRTH:  [birth year]  SUBJECTIVE:  Patient seen and evaluated today No BRBPR No vomiting of blood No abdominal pain Hemoglobin maintained around 11  REVIEW OF SYSTEMS:    ROS  CONSTITUTIONAL: No documented fever. Has fatigue, weakness. No weight gain, no weight loss.  EYES: No blurry or double vision.  ENT: No tinnitus. No postnasal drip. No redness of the oropharynx.  RESPIRATORY: No cough, no wheeze, no hemoptysis. No dyspnea.  CARDIOVASCULAR: No chest pain. No orthopnea. No palpitations. No syncope.  GASTROINTESTINAL: No nausea, no vomiting or diarrhea. No abdominal pain. Has melena. GENITOURINARY: No dysuria or hematuria.  ENDOCRINE: No polyuria or nocturia. No heat or cold intolerance.  HEMATOLOGY: No anemia. No bruising. No bleeding.  INTEGUMENTARY: No rashes. No lesions.  MUSCULOSKELETAL: No arthritis. No swelling. No gout.  NEUROLOGIC: No numbness, tingling, or ataxia. No seizure-type activity.  PSYCHIATRIC: No anxiety. No insomnia. No ADD.   DRUG ALLERGIES:   Allergies  Allergen Reactions  . Diphenhydramine Hcl Rash  . Penicillins Hives    .Has patient had a PCN reaction causing immediate rash, facial/tongue/throat swelling, SOB or lightheadedness with hypotension: Unknown Has patient had a PCN reaction causing severe rash involving mucus membranes or skin necrosis: Unknown Has patient had a PCN reaction that required hospitalization: Unknown Has patient had a PCN reaction occurring within the last 10 years: Unknown If all of the above answers are "NO", then may proceed with Cephalosporin use.   . Captopril Other (See Comments)  . Eliquis [Apixaban] Other (See Comments)    Stomach bleed  . Enalapril Maleate Other (See Comments)    Other reaction(s): Unknown  . Tape Itching  . Terfenadine Other (See Comments)  . Augmentin [Amoxicillin-Pot Clavulanate]  Rash    Has patient had a PCN reaction causing immediate rash, facial/tongue/throat swelling, SOB or lightheadedness with hypotension: Unknown Has patient had a PCN reaction causing severe rash involving mucus membranes or skin necrosis: Unknown Has patient had a PCN reaction that required hospitalization: Unknown Has patient had a PCN reaction occurring within the last 10 years: Unknown If all of the above answers are "NO", then may proceed with Cephalosporin use.  . Biaxin [Clarithromycin] Rash, Other (See Comments) and Hives    Other reaction(s): Unknown    VITALS:  Blood pressure (!) 140/59, pulse 66, temperature 98.8 F (37.1 C), temperature source Oral, resp. rate 18, height 5' (1.524 m), weight 72.4 kg, SpO2 98 %.  PHYSICAL EXAMINATION:   Physical Exam  GENERAL:  85 y.o.-year-old patient lying in the bed with no acute distress.  EYES: Pupils equal, round, reactive to light and accommodation. No scleral icterus. Extraocular muscles intact.  HEENT: Head atraumatic, normocephalic. Oropharynx and nasopharynx clear.  NECK:  Supple, no jugular venous distention. No thyroid enlargement, no tenderness.  LUNGS: Normal breath sounds bilaterally, no wheezing, rales, rhonchi. No use of accessory muscles of respiration.  CARDIOVASCULAR: S1, S2 normal. No murmurs, rubs, or gallops.  ABDOMEN: Soft, nontender, nondistended. Bowel sounds present. No organomegaly or mass.  EXTREMITIES: No cyanosis, clubbing or edema b/l.    NEUROLOGIC: Cranial nerves II through XII are intact. No focal Motor or sensory deficits b/l.   PSYCHIATRIC: The patient is alert and oriented x 3.  SKIN: No obvious rash, lesion, or ulcer.   LABORATORY PANEL:   CBC Recent Labs  Lab 02/20/19 0559  WBC 6.0  HGB 11.0*  HCT 32.6*  PLT 252   ------------------------------------------------------------------------------------------------------------------ Chemistries  Recent Labs  Lab 02/19/19 0508  NA 132*  K 3.7   CL 102  CO2 23  GLUCOSE 108*  BUN 8  CREATININE 0.91  CALCIUM 8.3*   ------------------------------------------------------------------------------------------------------------------  Cardiac Enzymes No results for input(s): TROPONINI in the last 168 hours. ------------------------------------------------------------------------------------------------------------------  RADIOLOGY:  No results found.   ASSESSMENT AND PLAN:   85 year old female patient with history of chronic atrial fibrillation on Eliquis for anticoagulation, hypertension, COPD, breast cancer currently under hospitalist service  -Acute gastrointestinal bleeding Hold Eliquis for now Appreciate gastroenterology evaluation with Dr Allen Norris Due for endoscopy and colonoscopy today Monitor hemoglobin hematocrit Current hemoglobin 11.0 If hemoglobin falls less than 7 blood transfusion IV Protonix 40 MG 12 hourly  -Atrial fibrillation chronic Continue diltiazem for rate control -eliquis on hold  -Epistaxis has resolved  -Emphysema stable  -COVID-19 test negative  -DVT prophylaxis sequential compression device to lower extremities  D/c pending GI work up  CODE STATUS: Full code  DVT Prophylaxis: SCDs  TOTAL TIME TAKING CARE OF THIS PATIENT: 25 minutes.   POSSIBLE D/C IN 1-2 DAYS, DEPENDING ON CLINICAL CONDITION.  Fritzi Mandes M.D on 02/20/2019 at 9:30 AM  Between 7am to 6pm - Pager - [phone]  After 6pm go to [email or web address] - password [email or web address] [place] [facility]  Office  [phone]  CC: Primary care physician; [name], MD  Note: This dictation was prepared with Dragon dictation along with smaller phrase technology. Any transcriptional errors that result from this process are unintentional.

## 2019-02-20 NOTE — Progress Notes (Signed)
Nurse spoke with Dr. Posey Pronto this morning if she would approve to D/C telemetry monitoring order for this patient as we are short on telemetry monitors. The patient has a history of Afib yet her heart rate has been sustaining between 60-80bpm. Telemetry order to be d/c.

## 2019-02-20 NOTE — Progress Notes (Signed)
Fleet enema given this morning.

## 2019-02-21 ENCOUNTER — Encounter: Payer: Self-pay | Admitting: Gastroenterology

## 2019-02-21 MED ORDER — HYDRALAZINE HCL 25 MG PO TABS: 25.0000 mg | ORAL_TABLET | Freq: Three times a day (TID) | ORAL | Status: DC

## 2019-02-21 MED ORDER — HYDRALAZINE HCL 50 MG PO TABS
50.0000 mg | ORAL_TABLET | Freq: Three times a day (TID) | ORAL | Status: DC
  Administered 2019-02-21: 50 mg via ORAL
  Filled 2019-02-21: qty 1

## 2019-02-21 MED ORDER — HYDRALAZINE HCL 10 MG PO TABS: 50.0000 mg | ORAL_TABLET | Freq: Three times a day (TID) | ORAL | 0 refills | Status: DC

## 2019-02-21 MED ORDER — LABETALOL HCL 5 MG/ML IV SOLN
20.0000 mg | INTRAVENOUS | Status: DC | PRN
  Administered 2019-02-21: 20 mg via INTRAVENOUS
  Filled 2019-02-21: qty 4

## 2019-02-21 NOTE — TOC Transition Note (Signed)
Transition of Care [facility]) - CM/SW Discharge Note   Patient Details  Name: Janice Perkins MRN: [id number] Date of Birth: 15-Nov-1932  [facility] of Care [facility]) CM/SW Contact:  Beverly Sessions, RN Phone Number: 02/21/2019, 10:35 AM   Clinical Narrative:     Patient to discharge today Per MD and bedside nurse no home health needs indicated. Follow up appointments made  Final next level of care: Home/Self Care Barriers to Discharge: No Barriers Identified   Patient Goals and CMS Choice        Discharge Placement                       Discharge Plan and Services   Discharge Planning Services: CM Consult                                 Social Determinants of Health (SDOH) Interventions     Readmission Risk Interventions Readmission Risk Prevention Plan 02/21/2019 02/19/2019  Transportation Screening Complete Complete  Medication Review ([place]) Complete Complete  PCP or Specialist appointment within 3-5 days of discharge Not Complete -  PCP/Specialist Appt Not Complete comments PCP appointment 7 days 7/14 -  [place] or Home Care Consult Patient refused Complete  Palliative Care Screening Not Applicable -  [place] Not Applicable -  Some recent data might be hidden

## 2019-02-21 NOTE — Discharge Summary (Signed)
[facility] NAME: Janice Perkins    MR#:  [id number]  DATE OF BIRTH:  Oct 01, 1932  DATE OF ADMISSION:  02/17/2019 ADMITTING PHYSICIAN: Saundra Shelling, MD  DATE OF DISCHARGE: 02/21/2019  PRIMARY CARE PHYSICIAN: [name], MD    ADMISSION DIAGNOSIS:  Melena [K92.1]  DISCHARGE DIAGNOSIS:  GI bleed/ Melena--esophagitis/non-bleeding duodenal ulcers Recent Epistaxis  With recent packing--now rmeoved Chronic afib--now OFF eliquis HTN SECONDARY DIAGNOSIS:   Past Medical History:  Diagnosis Date  . Asthma   . Atrial fibrillation ([place])   . Breast cancer ([place]) 2011  . Bronchitis 04/2015  . Cancer [facility]) 2011   breast  . COPD (chronic obstructive pulmonary disease) ([place])   . Hypertension   . Shingles    10/2015    HOSPITAL COURSE:   85 year old female patient with history of chronic atrial fibrillation on Eliquis for anticoagulation, hypertension, COPD, breast cancer currently under hospitalist service  * Acute gastrointestinal bleeding/Melena--suspect from recnet Epistaxis -D/ced Eliquis for now--pt not keen on resuming with recent Epistaxis requiring packing--Dr fath (pt;s cardiologist) informed. Pt can be started on asa 81 mg at a later date -Appreciate gastroenterology evaluation with Dr Allen Norris - endoscopy - Blood in oropharynx                       - Clotted blood at the cricopharyngeus.                       - LA Grade C reflux esophagitis.                       - Clotted blood in the gastric body.                       - Non-bleeding duodenal ulcer with no stigmata of                        bleeding. - colonoscopy - Non-bleeding internal hemorrhoids.                       - No specimens collected. -bleeding likely from recent Epistaxis with swallowed blood -Current hemoglobin 11.0 -If hemoglobin falls less than 7 blood transfusion -IV Protonix 40 MG 12 hourly-- oral PPI  *Atrial fibrillation chronic Continue  diltiazem for rate control -eliquis now d/ced  * Recent Epistaxis has resolved --required packing -pt will f/u dr Ladene Artist  *Emphysema stable  *COVID-19 test negative  *HTN  Cont hydralazine, cozaar and Cardizem  *DVT prophylaxis sequential compression device to lower extremities  Overall improving D/c home today. Pt agreeable.  CONSULTS OBTAINED:  Treatment Team:  Clyde Canterbury, MD  DRUG ALLERGIES:   Allergies  Allergen Reactions  . Diphenhydramine Hcl Rash  . Penicillins Hives    .Has patient had a PCN reaction causing immediate rash, facial/tongue/throat swelling, SOB or lightheadedness with hypotension: Unknown Has patient had a PCN reaction causing severe rash involving mucus membranes or skin necrosis: Unknown Has patient had a PCN reaction that required hospitalization: Unknown Has patient had a PCN reaction occurring within the last 10 years: Unknown If all of the above answers are "NO", then may proceed with Cephalosporin use.   . Captopril Other (See Comments)  . Eliquis [Apixaban] Other (See Comments)    Stomach bleed  . Enalapril Maleate Other (See Comments)  Other reaction(s): Unknown  . Tape Itching  . Terfenadine Other (See Comments)  . Augmentin [Amoxicillin-Pot Clavulanate] Rash    Has patient had a PCN reaction causing immediate rash, facial/tongue/throat swelling, SOB or lightheadedness with hypotension: Unknown Has patient had a PCN reaction causing severe rash involving mucus membranes or skin necrosis: Unknown Has patient had a PCN reaction that required hospitalization: Unknown Has patient had a PCN reaction occurring within the last 10 years: Unknown If all of the above answers are "NO", then may proceed with Cephalosporin use.  . Biaxin [Clarithromycin] Rash, Other (See Comments) and Hives    Other reaction(s): Unknown    DISCHARGE MEDICATIONS:   Allergies as of 02/21/2019      Reactions   Diphenhydramine Hcl Rash   Penicillins  Hives   .Has patient had a PCN reaction causing immediate rash, facial/tongue/throat swelling, SOB or lightheadedness with hypotension: Unknown Has patient had a PCN reaction causing severe rash involving mucus membranes or skin necrosis: Unknown Has patient had a PCN reaction that required hospitalization: Unknown Has patient had a PCN reaction occurring within the last 10 years: Unknown If all of the above answers are "NO", then may proceed with Cephalosporin use.   Captopril Other (See Comments)   Eliquis [apixaban] Other (See Comments)   Stomach bleed   Enalapril Maleate Other (See Comments)   Other reaction(s): Unknown   Tape Itching   Terfenadine Other (See Comments)   Augmentin [amoxicillin-pot Clavulanate] Rash   Has patient had a PCN reaction causing immediate rash, facial/tongue/throat swelling, SOB or lightheadedness with hypotension: Unknown Has patient had a PCN reaction causing severe rash involving mucus membranes or skin necrosis: Unknown Has patient had a PCN reaction that required hospitalization: Unknown Has patient had a PCN reaction occurring within the last 10 years: Unknown If all of the above answers are "NO", then may proceed with Cephalosporin use.   Biaxin [clarithromycin] Rash, Other (See Comments), Hives   Other reaction(s): Unknown      Medication List    STOP taking these medications   apixaban 5 MG Tabs tablet Commonly known as: ELIQUIS   clindamycin 300 MG capsule Commonly known as: CLEOCIN     TAKE these medications   albuterol 108 (90 Base) MCG/ACT inhaler Commonly known as: VENTOLIN HFA Inhale 2 puffs into the lungs every 6 (six) hours as needed for wheezing or shortness of breath.   albuterol 1.25 MG/3ML nebulizer solution Commonly known as: ACCUNEB Inhale 3 mLs into the lungs every 6 (six) hours as needed for wheezing.   alendronate 70 MG tablet Commonly known as: FOSAMAX Take 70 mg by mouth once a week.   ALPRAZolam 0.5 MG  tablet Commonly known as: XANAX Take 0.25-0.5 mg by mouth 2 (two) times a day. Take 0.25 mg in the morning, then 0.5 mg at night   atorvastatin 20 MG tablet Commonly known as: LIPITOR Take 20 mg by mouth at bedtime.   busPIRone 10 MG tablet Commonly known as: BUSPAR Take 10 mg by mouth 2 (two) times daily.   cholecalciferol 1000 units tablet Commonly known as: VITAMIN D Take 1,000 Units by mouth daily.   cloNIDine 0.1 MG tablet Commonly known as: CATAPRES Take 0.1 mg by mouth 2 (two) times daily as needed.   diclofenac sodium 1 % Gel Commonly known as: VOLTAREN Apply 2 g topically 4 (four) times daily.   diltiazem 120 MG 24 hr capsule Commonly known as: DILACOR XR Take 120 mg by mouth daily.  escitalopram 5 MG tablet Commonly known as: LEXAPRO Take 5 mg by mouth daily.   esomeprazole 40 MG capsule Commonly known as: NEXIUM Take 40 mg by mouth 2 (two) times daily before a meal.   ferrous sulfate 325 (65 FE) MG tablet Take 325 mg by mouth daily with breakfast.   Fluticasone-Salmeterol 250-50 MCG/DOSE Aepb Commonly known as: ADVAIR Inhale 1 puff into the lungs 2 (two) times daily.   hydrALAZINE 10 MG tablet Commonly known as: APRESOLINE Take 5 tablets (50 mg total) by mouth 3 (three) times daily.   HYDROcodone-acetaminophen 5-325 MG tablet Commonly known as: NORCO/VICODIN Take 1 tablet by mouth 2 (two) times daily as needed for moderate pain or severe pain.   levothyroxine 50 MCG tablet Commonly known as: SYNTHROID Take 50 mcg by mouth daily before breakfast.   loratadine 10 MG tablet Commonly known as: CLARITIN Take 10 mg by mouth daily.   losartan 50 MG tablet Commonly known as: COZAAR Take 2 tablets (100 mg total) by mouth at bedtime.   magnesium oxide 400 MG tablet Commonly known as: MAG-OX Take 400 mg by mouth 2 (two) times daily.   meclizine 25 MG tablet Commonly known as: ANTIVERT Take 1 tablet (25 mg total) by mouth 2 (two) times daily as  needed for dizziness.   meloxicam 15 MG tablet Commonly known as: MOBIC Take 15 mg by mouth daily.   mometasone-formoterol 100-5 MCG/ACT Aero Commonly known as: DULERA Inhale 2 puffs into the lungs 2 (two) times daily.   polyethylene glycol 17 g packet Commonly known as: MIRALAX / GLYCOLAX Take 17 g by mouth daily.       If you experience worsening of your admission symptoms, develop shortness of breath, life threatening emergency, suicidal or homicidal thoughts you must seek medical attention immediately by calling 911 or calling your MD immediately  if symptoms less severe.  You Must read complete instructions/literature along with all the possible adverse reactions/side effects for all the Medicines you take and that have been prescribed to you. Take any new Medicines after you have completely understood and accept all the possible adverse reactions/side effects.   Please note  You were cared for by a hospitalist during your hospital stay. If you have any questions about your discharge medications or the care you received while you were in the hospital after you are discharged, you can call the unit and asked to speak with the hospitalist on call if the hospitalist that took care of you is not available. Once you are discharged, your primary care physician will handle any further medical issues. Please note that NO REFILLS for any discharge medications will be authorized once you are discharged, as it is imperative that you return to your primary care physician (or establish a relationship with a primary care physician if you do not have one) for your aftercare needs so that they can reassess your need for medications and monitor your lab values. Today   SUBJECTIVE   I am just worried-- don't want to take Eliquis anymore   VITAL SIGNS:  Blood pressure (!) 160/74, pulse 66, temperature 98.1 F (36.7 C), temperature source Oral, resp. rate 20, height 5' (1.524 m), weight 72.4 kg,  SpO2 95 %.  I/O:    Intake/Output Summary (Last 24 hours) at 02/21/2019 0826 Last data filed at 02/21/2019 0650 Gross per 24 hour  Intake 2009.49 ml  Output -  Net 2009.49 ml    PHYSICAL EXAMINATION:  GENERAL:  85 y.o.-year-old patient  lying in the bed with no acute distress.  EYES: Pupils equal, round, reactive to light and accommodation. No scleral icterus. Extraocular muscles intact.  HEENT: Head atraumatic, normocephalic. Oropharynx and nasopharynx clear.  NECK:  Supple, no jugular venous distention. No thyroid enlargement, no tenderness.  LUNGS: Normal breath sounds bilaterally, no wheezing, rales,rhonchi or crepitation. No use of accessory muscles of respiration.  CARDIOVASCULAR: S1, S2 normal. No murmurs, rubs, or gallops.  ABDOMEN: Soft, non-tender, non-distended. Bowel sounds present. No organomegaly or mass.  EXTREMITIES: No pedal edema, cyanosis, or clubbing.  NEUROLOGIC: Cranial nerves II through XII are intact. Muscle strength 5/5 in all extremities. Sensation intact. Gait not checked.  PSYCHIATRIC: The patient is alert and oriented x 3.  SKIN: No obvious rash, lesion, or ulcer.   DATA REVIEW:   CBC  Recent Labs  Lab 02/20/19 0559  WBC 6.0  HGB 11.0*  HCT 32.6*  PLT 252    Chemistries  Recent Labs  Lab 02/19/19 0508  NA 132*  K 3.7  CL 102  CO2 23  GLUCOSE 108*  BUN 8  CREATININE 0.91  CALCIUM 8.3*    Microbiology Results   Recent Results (from the past 240 hour(s))  SARS Coronavirus 2 (CEPHEID - Performed in [facility] hospital lab), Hosp Order     Status: None   Collection Time: 02/17/19 11:29 AM   Specimen: Nasopharyngeal Swab  Result Value Ref Range Status   SARS Coronavirus 2 NEGATIVE NEGATIVE Final    Comment: (NOTE) If result is NEGATIVE SARS-CoV-2 target nucleic acids are NOT DETECTED. The SARS-CoV-2 RNA is generally detectable in upper and lower  respiratory specimens during the acute phase of infection. The lowest  concentration of  SARS-CoV-2 viral copies this assay can detect is 250  copies / mL. A negative result does not preclude SARS-CoV-2 infection  and should not be used as the sole basis for treatment or other  patient management decisions.  A negative result may occur with  improper specimen collection / handling, submission of specimen other  than nasopharyngeal swab, presence of viral mutation(s) within the  areas targeted by this assay, and inadequate number of viral copies  (<250 copies / mL). A negative result must be combined with clinical  observations, patient history, and epidemiological information. If result is POSITIVE SARS-CoV-2 target nucleic acids are DETECTED. The SARS-CoV-2 RNA is generally detectable in upper and lower  respiratory specimens dur ing the acute phase of infection.  Positive  results are indicative of active infection with SARS-CoV-2.  Clinical  correlation with patient history and other diagnostic information is  necessary to determine patient infection status.  Positive results do  not rule out bacterial infection or co-infection with other viruses. If result is PRESUMPTIVE POSTIVE SARS-CoV-2 nucleic acids MAY BE PRESENT.   A presumptive positive result was obtained on the submitted specimen  and confirmed on repeat testing.  While 2019 novel coronavirus  (SARS-CoV-2) nucleic acids may be present in the submitted sample  additional confirmatory testing may be necessary for epidemiological  and / or clinical management purposes  to differentiate between  SARS-CoV-2 and other Sarbecovirus currently known to infect humans.  If clinically indicated additional testing with an alternate test  methodology [phone]) is advised. The SARS-CoV-2 RNA is generally  detectable in upper and lower respiratory sp ecimens during the acute  phase of infection. The expected result is Negative. Fact Sheet for Patients:  StrictlyIdeas.no Fact Sheet for Healthcare  Providers: BankingDealers.co.za This test is not  yet approved or cleared by the [place] and has been authorized for detection and/or diagnosis of SARS-CoV-2 by FDA under an Emergency Use Authorization (EUA).  This EUA will remain in effect (meaning this test can be used) for the duration of the COVID-19 declaration under Section 564(b)(1) of the Act, 21 U.S.C. section 360bbb-3(b)(1), unless the authorization is terminated or revoked sooner. Performed at [facility], [address] [place], [place] [address]     RADIOLOGY:  No results found.   CODE STATUS:     Code Status Orders  (From admission, onward)         Start     Ordered   02/17/19 1440  Full code  Continuous     02/17/19 1440        Code Status History    Date Active Date Inactive Code Status Order ID Comments User Context   12/11/2018 1041 12/13/2018 1437 Full Code [id number]  Loletha Grayer, MD ED   09/02/2018 1156 09/08/2018 1621 Full Code [id number]  Nicholes Mango, MD Inpatient   05/30/2018 1127 05/31/2018 1740 Full Code [id number]  Bettey Costa, MD Inpatient   11/15/2017 1829 11/21/2017 1821 Full Code [id number]  Vaughan Basta, MD Inpatient   05/16/2017 0041 05/17/2017 1824 Full Code [id number]  Quintella Baton, MD ED   03/07/2017 1446 03/09/2017 1509 Full Code [id number]  Idelle Crouch, MD Inpatient   01/19/2017 0837 01/21/2017 1415 Full Code [id number]  Harrie Foreman, MD Inpatient   12/18/2016 0718 12/22/2016 1947 Full Code [id number]  Harrie Foreman, MD Inpatient   Advance Care Planning Activity    Advance Directive Documentation     Most Recent Value  Type of Advance Directive  Living will  Pre-existing out of facility DNR order (yellow form or pink MOST form)  -  "MOST" Form in Place?  -      TOTAL TIME TAKING CARE OF THIS PATIENT: 40  minutes.    Fritzi Mandes M.D on 02/21/2019 at 8:26 AM  Between 7am to 6pm - Pager - [phone] After 6pm go to  [email or web address] - password [email or web address]  Office  [phone]  CC: Primary care physician; [name], MD

## 2019-02-21 NOTE — Plan of Care (Signed)

## 2019-02-21 NOTE — Care Management Important Message (Signed)
Important Message  Patient Details  Name: Janice Perkins MRN: [id number] Date of Birth: 25-Apr-1933   Medicare Important Message Given:  Yes     Dannette Barbara 02/21/2019, 11:35 AM

## 2019-02-21 NOTE — Progress Notes (Signed)
NT reported patient's BP was 189/98. I paged hospitalist and Dr Sidney Ace returned my page. He ordered labetolol  20 mg IV q 3 hrs prn. Order placed.

## 2019-03-26 ENCOUNTER — Other Ambulatory Visit: Payer: Self-pay

## 2019-03-26 ENCOUNTER — Emergency Department: Payer: Medicare Other

## 2019-03-26 ENCOUNTER — Emergency Department
Admission: EM | Admit: 2019-03-26 | Discharge: 2019-03-26 | Disposition: A | Discharge status: Home / Self Care | Payer: Medicare Other | Attending: Student | Admitting: Student

## 2019-03-26 DIAGNOSIS — R42 Dizziness and giddiness: Secondary | ICD-10-CM | POA: Insufficient documentation

## 2019-03-26 DIAGNOSIS — I10 Essential (primary) hypertension: Secondary | ICD-10-CM | POA: Insufficient documentation

## 2019-03-26 DIAGNOSIS — J449 Chronic obstructive pulmonary disease, unspecified: Secondary | ICD-10-CM | POA: Insufficient documentation

## 2019-03-26 DIAGNOSIS — Z79899 Other long term (current) drug therapy: Secondary | ICD-10-CM | POA: Insufficient documentation

## 2019-03-26 DIAGNOSIS — Z853 Personal history of malignant neoplasm of breast: Secondary | ICD-10-CM | POA: Diagnosis not present

## 2019-03-26 LAB — URINALYSIS, COMPLETE (UACMP) WITH MICROSCOPIC
Bacteria, UA: NONE SEEN
Bilirubin Urine: NEGATIVE
Glucose, UA: NEGATIVE mg/dL
Hgb urine dipstick: NEGATIVE
Ketones, ur: NEGATIVE mg/dL
Leukocytes,Ua: NEGATIVE
Nitrite: NEGATIVE
Protein, ur: NEGATIVE mg/dL
Specific Gravity, Urine: 1.004 — ABNORMAL LOW (ref 1.005–1.030)
pH: 7 (ref 5.0–8.0)

## 2019-03-26 LAB — CBC
HCT: 37.3 % (ref 36.0–46.0)
Hemoglobin: 12.4 g/dL (ref 12.0–15.0)
MCH: 30.8 pg (ref 26.0–34.0)
MCHC: 33.2 g/dL (ref 30.0–36.0)
MCV: 92.8 fL (ref 80.0–100.0)
Platelets: 195 10*3/uL (ref 150–400)
RBC: 4.02 MIL/uL (ref 3.87–5.11)
RDW: 13.2 % (ref 11.5–15.5)
WBC: 6.4 10*3/uL (ref 4.0–10.5)
nRBC: 0 % (ref 0.0–0.2)

## 2019-03-26 LAB — BASIC METABOLIC PANEL
Anion gap: 11 (ref 5–15)
BUN: 16 mg/dL (ref 8–23)
CO2: 23 mmol/L (ref 22–32)
Calcium: 9 mg/dL (ref 8.9–10.3)
Chloride: 96 mmol/L — ABNORMAL LOW (ref 98–111)
Creatinine, Ser: 0.85 mg/dL (ref 0.44–1.00)
GFR calc Af Amer: >60 mL/min (ref >60)
GFR calc non Af Amer: >60 mL/min (ref >60)
Glucose, Bld: 105 mg/dL — ABNORMAL HIGH (ref 70–99)
Potassium: 4 mmol/L (ref 3.5–5.1)
Sodium: 130 mmol/L — ABNORMAL LOW (ref 135–145)

## 2019-03-26 LAB — TROPONIN I (HIGH SENSITIVITY): Troponin I (High Sensitivity): 9 ng/L (ref <18)

## 2019-03-26 NOTE — ED Notes (Signed)
Pt's husband at bedside.

## 2019-03-26 NOTE — ED Notes (Signed)
Patient transported to MRI 

## 2019-03-26 NOTE — ED Notes (Signed)
Pt returned from MRI °

## 2019-03-26 NOTE — ED Notes (Signed)
Pt reports feeling dizzy for 1 week; history of vertigo; pt of Dr Bethanne Ginger and did see him mid-week; reported the dizziness to him; the only medication he changed was he had her up her PRN meclizine to 3 times daily as needed; pt has been taking 3 times daily with little relief; pt says she was not in A-fib and that time but presents in A-fib this am; pt awake and alert; talking in complete coherent sentences; very talkative; pt adds her blood pressure was elevated this am and she did take her Clonidine as ordered; call bell in reach; side rails up

## 2019-03-26 NOTE — ED Provider Notes (Signed)
[facility] Emergency Department Provider Note  ____________________________________________   First MD Initiated Contact with Patient 03/26/19 [phone]     (approximate)  I have reviewed the triage vital signs and the nursing notes.  History  Chief Complaint Dizziness    HPI Janice Perkins is a 85 y.o. female with a history of hypertension, atrial fibrillation, CVA who presents to the emergency department for several weeks of intermittent episodes of vertigo.  She states she feels very off balance, describes it as a "drunk like feeling".  For the last several days she has essentially stayed in bed all day due to her symptoms.  She cannot identify a particular alleviating or aggravating factor.  She has never syncopized or fallen because of it.  She has been trying meclizine for her symptoms, with no significant improvement.  Her medication list, in addition to the meclizine includes diazepam, alprazolam, clonidine, and diltiazem.         Past Medical Hx Past Medical History:  Diagnosis Date  . Asthma   . Atrial fibrillation ([place])   . Breast cancer ([place]) 2011  . Bronchitis 04/2015  . Cancer [facility]) 2011   breast  . COPD (chronic obstructive pulmonary disease) ([place])   . Hypertension   . Shingles    10/2015    Problem List Patient Active Problem List   Diagnosis Date Noted  . Duodenum ulcer   . Melena   . GI bleed 02/17/2019  . Hypertensive encephalopathy 12/11/2018  . Hypoxia 05/30/2018  . Leukocytosis 04/05/2018  . Pneumonia 11/15/2017  . Pleural effusion 05/16/2017  . AF (paroxysmal atrial fibrillation) ([place]) 05/16/2017  . Breast cancer ([place]) 05/16/2017  . Dependence on nocturnal oxygen therapy 05/16/2017  . HTN (hypertension) 05/16/2017  . Generalized weakness 03/07/2017  . Hyponatremia 03/07/2017  . Dehydration 03/07/2017  . UTI (urinary tract infection) 03/07/2017  . HCAP (healthcare-associated pneumonia) 01/19/2017  . AKI (acute  kidney injury) ([place]) 12/18/2016  . Primary cancer of upper outer quadrant of right female breast ([place]) 04/07/2016  . Lumbar radiculopathy 03/23/2016  . Spinal stenosis, lumbar region, with neurogenic claudication 03/23/2016  . DDD (degenerative disc disease), lumbar 01/14/2015  . Lumbosacral facet joint syndrome 01/14/2015  . Sacroiliac joint dysfunction 01/14/2015  . Greater trochanteric bursitis 01/14/2015  . Bilateral occipital neuralgia 01/14/2015    Past Surgical Hx Past Surgical History:  Procedure Laterality Date  . BREAST EXCISIONAL BIOPSY Right 11/29/13   two areas FAT NECROSIS  . BREAST EXCISIONAL BIOPSY Right 10/30/2009   lumpectomy - radiation  . COLONOSCOPY WITH PROPOFOL N/A 06/10/2018   Procedure: COLONOSCOPY WITH PROPOFOL;  Surgeon: Manya Silvas, MD;  Location: [facility] ENDOSCOPY;  Service: Endoscopy;  Laterality: N/A;  . COLONOSCOPY WITH PROPOFOL N/A 02/20/2019   Procedure: COLONOSCOPY WITH PROPOFOL;  Surgeon: Lucilla Lame, MD;  Location: [facility] ENDOSCOPY;  Service: Endoscopy;  Laterality: N/A;  . ESOPHAGOGASTRODUODENOSCOPY (EGD) WITH PROPOFOL N/A 06/10/2018   Procedure: ESOPHAGOGASTRODUODENOSCOPY (EGD) WITH PROPOFOL;  Surgeon: Manya Silvas, MD;  Location: [facility] ENDOSCOPY;  Service: Endoscopy;  Laterality: N/A;  . ESOPHAGOGASTRODUODENOSCOPY (EGD) WITH PROPOFOL N/A 02/20/2019   Procedure: ESOPHAGOGASTRODUODENOSCOPY (EGD) WITH PROPOFOL;  Surgeon: Lucilla Lame, MD;  Location: [facility] ENDOSCOPY;  Service: Endoscopy;  Laterality: N/A;  . NASAL SINUS SURGERY      Medications Prior to Admission medications   Medication Sig Start Date End Date Taking? Authorizing Provider  albuterol (ACCUNEB) 1.25 MG/3ML nebulizer solution Inhale 3 mLs into the lungs every 6 (six) hours as needed for  wheezing.     [provider]  albuterol (PROVENTIL HFA;VENTOLIN HFA) 108 (90 Base) MCG/ACT inhaler Inhale 2 puffs into the lungs every 6 (six) hours as needed for wheezing or shortness of  breath.     [provider]  alendronate (FOSAMAX) 70 MG tablet Take 70 mg by mouth once a week. 02/01/19 05/02/19  [provider]  ALPRAZolam Duanne Moron) 0.5 MG tablet Take 0.25-0.5 mg by mouth 2 (two) times a day. Take 0.25 mg in the morning, then 0.5 mg at night    [provider]  atorvastatin (LIPITOR) 20 MG tablet Take 20 mg by mouth at bedtime.     [provider]  busPIRone (BUSPAR) 10 MG tablet Take 10 mg by mouth 2 (two) times daily.    [provider]  cholecalciferol (VITAMIN D) 1000 units tablet Take 1,000 Units by mouth daily.    [provider]  cloNIDine (CATAPRES) 0.1 MG tablet Take 0.1 mg by mouth 2 (two) times daily as needed. 02/13/19   [provider]  diclofenac sodium (VOLTAREN) 1 % GEL Apply 2 g topically 4 (four) times daily. 02/01/19   [provider]  diltiazem (DILACOR XR) 120 MG 24 hr capsule Take 120 mg by mouth daily.    [provider]  escitalopram (LEXAPRO) 5 MG tablet Take 5 mg by mouth daily.    [provider]  esomeprazole (NEXIUM) 40 MG capsule Take 40 mg by mouth 2 (two) times daily before a meal.     [provider]  ferrous sulfate 325 (65 FE) MG tablet Take 325 mg by mouth daily with breakfast.    [provider]  Fluticasone-Salmeterol (ADVAIR) 250-50 MCG/DOSE AEPB Inhale 1 puff into the lungs 2 (two) times daily.    [provider]  hydrALAZINE (APRESOLINE) 10 MG tablet Take 5 tablets (50 mg total) by mouth 3 (three) times daily. 02/21/19   Fritzi Mandes, MD  HYDROcodone-acetaminophen (NORCO/VICODIN) 5-325 MG tablet Take 1 tablet by mouth 2 (two) times daily as needed for moderate pain or severe pain.     [provider]  levothyroxine (SYNTHROID, LEVOTHROID) 50 MCG tablet Take 50 mcg by mouth daily before breakfast.    [provider]  loratadine (CLARITIN) 10 MG tablet Take 10 mg by mouth daily.    [provider]   losartan (COZAAR) 50 MG tablet Take 2 tablets (100 mg total) by mouth at bedtime. 12/13/18   Loletha Grayer, MD  magnesium oxide (MAG-OX) 400 MG tablet Take 400 mg by mouth 2 (two) times daily.    [provider]  meclizine (ANTIVERT) 25 MG tablet Take 1 tablet (25 mg total) by mouth 2 (two) times daily as needed for dizziness. 09/16/18   Merlyn Lot, MD  meloxicam (MOBIC) 15 MG tablet Take 15 mg by mouth daily. 02/07/19   [provider]  mometasone-formoterol (DULERA) 100-5 MCG/ACT AERO Inhale 2 puffs into the lungs 2 (two) times daily.    [provider]  polyethylene glycol (MIRALAX / GLYCOLAX) 17 g packet Take 17 g by mouth daily. 01/03/19   Earleen Newport, MD    Allergies Diphenhydramine hcl, Penicillins, Captopril, Eliquis [apixaban], Enalapril maleate, Tape, Terfenadine, Augmentin [amoxicillin-pot clavulanate], and Biaxin [clarithromycin]  Family Hx Family History  Problem Relation Age of Onset  . Hypertension Mother   . Heart disease Mother   . Cancer Mother   . Stroke Father   . Hypertension Father   . Breast cancer Sister  Social Hx Social History   Tobacco Use  . Smoking status: Never Smoker  . Smokeless tobacco: Never Used  Substance Use Topics  . Alcohol use: No    Alcohol/week: 0.0 standard drinks  . Drug use: No     Review of Systems  Constitutional: Negative for fever. Negative for chills. Eyes: Negative for visual changes. ENT: Negative for sore throat. Cardiovascular: Negative for chest pain. Respiratory: Negative for shortness of breath. Gastrointestinal: Negative for abdominal pain. Negative for nausea. Negative for vomiting. Genitourinary: Negative for dysuria. Musculoskeletal: Negative for leg swelling. Skin: Negative for rash. Neurological: Positive for vertigo.   Physical Exam  Vital Signs: ED Triage Vitals  Enc Vitals Group     BP 03/26/19 0600 (!) 142/74     Pulse Rate 03/26/19 0600 97     Resp  03/26/19 0600 18     Temp 03/26/19 0603 97.8 F (36.6 C)     Temp Source 03/26/19 0600 Oral     SpO2 03/26/19 0600 97 %     Weight 03/26/19 0559 163 lb (73.9 kg)     Height 03/26/19 0559 5' (1.524 m)     Head Circumference --      Peak Flow --      Pain Score 03/26/19 0559 8     Pain Loc --      Pain Edu? --      Excl. in [place]? --     Constitutional: Alert and oriented.  Eyes: Conjunctivae clear. Sclera anicteric.  1 beat end horizontal gaze nystagmus, does not reproduce symptoms. Head: Normocephalic. Atraumatic. Nose: No congestion. No rhinorrhea. Mouth/Throat: Mucous membranes are moist.  Neck: No stridor.   Cardiovascular: Bradycardic, regular. No murmurs. Extremities well perfused. Respiratory: Normal respiratory effort.  Lungs CTAB. Gastrointestinal: Soft and non-tender. No distention.  Musculoskeletal: No lower extremity edema. Neurologic:  Normal speech and language. No gross focal neurologic deficits are appreciated. UE and LE strength 5/5 and symmetric. Heel to shin and finger to nose in tact. Skin: Skin is warm, dry and intact. No rash noted. Psychiatric: Mood and affect are appropriate for situation.  EKG  Personally reviewed.   Rate: Approximately 50s Rhythm: Atrial fibrillation Axis: Normal Intervals: QTC 446 ms No acute ischemic changes.   Radiology  MRI w/o acute stroke.    Procedures  Procedure(s) performed (including critical care):  Procedures   Initial Impression / Assessment and Plan / ED Course  85 y.o. female with a history of hypertension, atrial fibrillation, CVA who presents to the emergency department for several weeks of intermittent episodes of vertigo.  On exam, she is in AF, intermittently bradycardic. No focal neurological deficits. No ataxia.   Ddx includes CVA, peripheral vertigo, electrolyte disturbance, symptomatic bradycardia, polypharmacy (patient's medication list includes Xanax, diazepam, meclizine, and hydrocodone; as  well as clonidine PRN and diltiazem). Will obtain labs, imaging.   MRI negative for acute stroke, no UTI, no significant electrolyte abnormalities.   Updated patient and husband on results.  Discussed my concern for polypharmacy being the etiology or at least exacerbating her symptoms.  Offered admission for expedited medication adjustments, however at this time the patient prefers to be discharged, and will follow-up with her primary care provider on Monday.  I discussed the importance of this visit, as well as the importance of reviewing each of her medications with her primary doctor, both she and husband voiced understanding.  Advise holding her clonidine until seeing her physician given her intermittent bradycardia here (she states she  took several doses in the last 24 hours).  Discussed strict return precautions.   Final Clinical Impression(s) / ED Diagnosis  Final diagnoses:  Dizziness      Note:  This document was prepared using Dragon voice recognition software and may include unintentional dictation errors.   Lilia Pro., MD 03/26/19 1600

## 2019-03-26 NOTE — ED Notes (Signed)
Pt transpor

## 2019-03-26 NOTE — ED Notes (Signed)
Patient unhooked from monitor to use restroom at this time

## 2019-03-26 NOTE — ED Triage Notes (Signed)
Pt ambulatory without difficulty. Pt states has been dizzy for "days". Pt states she also feels like at times she is in and out of a fib. Pt states she currently has a headache and "all over body pain". It is very difficult to keep pt on topic.

## 2019-03-26 NOTE — Discharge Instructions (Addendum)
Thank you for letting us take care of you in the emergency department today.   Please follow up with your primary care doctor to review your ER visit and follow up on your symptoms.  It is very important that you review all of your medications with your doctor, as some of the medications on your medication list are doing the same thing and some of them might be contributing to your symptoms.  (For example, your list includes Xanax, diazepam, meclizine, and hydrocodone; as well as clonidine and diltiazem which might be lowering your heart rate.)  Please return to the ER for any new or worsening symptoms.

## 2019-03-26 NOTE — ED Notes (Signed)
Pt spoke with MRI on RN phone

## 2019-03-26 NOTE — ED Notes (Signed)
Called pt's husband and invited him in to her bedside;

## 2019-04-06 ENCOUNTER — Other Ambulatory Visit: Payer: Self-pay

## 2019-04-06 ENCOUNTER — Encounter: Payer: Self-pay | Admitting: Emergency Medicine

## 2019-04-06 ENCOUNTER — Emergency Department
Admission: EM | Admit: 2019-04-06 | Discharge: 2019-04-06 | Disposition: A | Discharge status: Home / Self Care | Payer: Medicare Other | Attending: Emergency Medicine | Admitting: Emergency Medicine

## 2019-04-06 DIAGNOSIS — J449 Chronic obstructive pulmonary disease, unspecified: Secondary | ICD-10-CM | POA: Diagnosis not present

## 2019-04-06 DIAGNOSIS — Z79899 Other long term (current) drug therapy: Secondary | ICD-10-CM | POA: Insufficient documentation

## 2019-04-06 DIAGNOSIS — Z888 Allergy status to other drugs, medicaments and biological substances status: Secondary | ICD-10-CM | POA: Insufficient documentation

## 2019-04-06 DIAGNOSIS — Z881 Allergy status to other antibiotic agents status: Secondary | ICD-10-CM | POA: Diagnosis not present

## 2019-04-06 DIAGNOSIS — Z853 Personal history of malignant neoplasm of breast: Secondary | ICD-10-CM | POA: Diagnosis not present

## 2019-04-06 DIAGNOSIS — I480 Paroxysmal atrial fibrillation: Secondary | ICD-10-CM | POA: Insufficient documentation

## 2019-04-06 DIAGNOSIS — Z880 Allergy status to penicillin: Secondary | ICD-10-CM | POA: Insufficient documentation

## 2019-04-06 DIAGNOSIS — I10 Essential (primary) hypertension: Secondary | ICD-10-CM | POA: Insufficient documentation

## 2019-04-06 NOTE — ED Provider Notes (Signed)
[facility] Emergency Department Provider Note   ____________________________________________    I have reviewed the triage vital signs and the nursing notes.   HISTORY  Chief Complaint Hypertension     HPI Janice Perkins is a 85 y.o. female Janice Perkins with complaints of high blood pressure.  Patient reports she woke up this morning and just was not feeling well.  She decided check her blood pressure and found to be significantly elevated which caused her a great deal of concern.  She did take her blood pressure medication as well as 1-2 clonidine.  This did not seem to help so she presented to the emergency department.  However now she reports she is feeling better and her blood pressure is improved.  No chest pain or shortness of breath.  Past Medical History:  Diagnosis Date  . Asthma   . Atrial fibrillation ([place])   . Breast cancer ([place]) 2011  . Bronchitis 04/2015  . Cancer [facility]) 2011   breast  . COPD (chronic obstructive pulmonary disease) ([place])   . Hypertension   . Shingles    10/2015    Patient Active Problem List   Diagnosis Date Noted  . Duodenum ulcer   . Melena   . GI bleed 02/17/2019  . Hypertensive encephalopathy 12/11/2018  . Hypoxia 05/30/2018  . Leukocytosis 04/05/2018  . Pneumonia 11/15/2017  . Pleural effusion 05/16/2017  . AF (paroxysmal atrial fibrillation) ([place]) 05/16/2017  . Breast cancer ([place]) 05/16/2017  . Dependence on nocturnal oxygen therapy 05/16/2017  . HTN (hypertension) 05/16/2017  . Generalized weakness 03/07/2017  . Hyponatremia 03/07/2017  . Dehydration 03/07/2017  . UTI (urinary tract infection) 03/07/2017  . HCAP (healthcare-associated pneumonia) 01/19/2017  . AKI (acute kidney injury) ([place]) 12/18/2016  . Primary cancer of upper outer quadrant of right female breast ([place]) 04/07/2016  . Lumbar radiculopathy 03/23/2016  . Spinal stenosis, lumbar region, with neurogenic claudication 03/23/2016  . DDD  (degenerative disc disease), lumbar 01/14/2015  . Lumbosacral facet joint syndrome 01/14/2015  . Sacroiliac joint dysfunction 01/14/2015  . Greater trochanteric bursitis 01/14/2015  . Bilateral occipital neuralgia 01/14/2015    Past Surgical History:  Procedure Laterality Date  . BREAST EXCISIONAL BIOPSY Right 11/29/13   two areas FAT NECROSIS  . BREAST EXCISIONAL BIOPSY Right 10/30/2009   lumpectomy - radiation  . COLONOSCOPY WITH PROPOFOL N/A 06/10/2018   Procedure: COLONOSCOPY WITH PROPOFOL;  Surgeon: Manya Silvas, MD;  Location: [facility] ENDOSCOPY;  Service: Endoscopy;  Laterality: N/A;  . COLONOSCOPY WITH PROPOFOL N/A 02/20/2019   Procedure: COLONOSCOPY WITH PROPOFOL;  Surgeon: Lucilla Lame, MD;  Location: [facility] ENDOSCOPY;  Service: Endoscopy;  Laterality: N/A;  . ESOPHAGOGASTRODUODENOSCOPY (EGD) WITH PROPOFOL N/A 06/10/2018   Procedure: ESOPHAGOGASTRODUODENOSCOPY (EGD) WITH PROPOFOL;  Surgeon: Manya Silvas, MD;  Location: [facility] ENDOSCOPY;  Service: Endoscopy;  Laterality: N/A;  . ESOPHAGOGASTRODUODENOSCOPY (EGD) WITH PROPOFOL N/A 02/20/2019   Procedure: ESOPHAGOGASTRODUODENOSCOPY (EGD) WITH PROPOFOL;  Surgeon: Lucilla Lame, MD;  Location: [facility] ENDOSCOPY;  Service: Endoscopy;  Laterality: N/A;  . NASAL SINUS SURGERY      Prior to Admission medications   Medication Sig Start Date End Date Taking? Authorizing Provider  albuterol (ACCUNEB) 1.25 MG/3ML nebulizer solution Inhale 3 mLs into the lungs every 6 (six) hours as needed for wheezing.     [provider]  albuterol (PROVENTIL HFA;VENTOLIN HFA) 108 (90 Base) MCG/ACT inhaler Inhale 2 puffs into the lungs every 6 (six) hours as needed for wheezing or shortness of breath.  [provider]  alendronate (FOSAMAX) 70 MG tablet Take 70 mg by mouth once a week. 02/01/19 05/02/19  [provider]  ALPRAZolam Duanne [place]on) 0.5 MG tablet Take 0.25-0.5 mg by mouth 2 (two) times a day. Take 0.25 mg in the morning, then 0.5  mg at night    [provider]  atorvastatin (LIPITOR) 20 MG tablet Take 20 mg by mouth at bedtime.     [provider]  busPIRone (BUSPAR) 10 MG tablet Take 10 mg by mouth 2 (two) times daily.    [provider]  cholecalciferol (VITAMIN D) 1000 units tablet Take 1,000 Units by mouth daily.    [provider]  cloNIDine (CATAPRES) 0.1 MG tablet Take 0.1 mg by mouth 2 (two) times daily as needed. 02/13/19   [provider]  diclofenac sodium (VOLTAREN) 1 % GEL Apply 2 g topically 4 (four) times daily. 02/01/19   [provider]  diltiazem (DILACOR XR) 120 MG 24 hr capsule Take 120 mg by mouth daily.    [provider]  escitalopram (LEXAPRO) 5 MG tablet Take 5 mg by mouth daily.    [provider]  esomeprazole (NEXIUM) 40 MG capsule Take 40 mg by mouth 2 (two) times daily before a meal.     [provider]  ferrous sulfate 325 (65 FE) MG tablet Take 325 mg by mouth daily with breakfast.    [provider]  Fluticasone-Salmeterol (ADVAIR) 250-50 MCG/DOSE AEPB Inhale 1 puff into the lungs 2 (two) times daily.    [provider]  hydrALAZINE (APRESOLINE) 10 MG tablet Take 5 tablets (50 mg total) by mouth 3 (three) times daily. 02/21/19   Fritzi Mandes, MD  HYDROcodone-acetaminophen (NORCO/VICODIN) 5-325 MG tablet Take 1 tablet by mouth 2 (two) times daily as needed for moderate pain or severe pain.     [provider]  levothyroxine (SYNTHROID, LEVOTHROID) 50 MCG tablet Take 50 mcg by mouth daily before breakfast.    [provider]  loratadine (CLARITIN) 10 MG tablet Take 10 mg by mouth daily.    [provider]  losartan (COZAAR) 50 MG tablet Take 2 tablets (100 mg total) by mouth at bedtime. 12/13/18   Loletha Grayer, MD  magnesium oxide (MAG-OX) 400 MG tablet Take 400 mg by mouth 2 (two) times daily.    [provider]  meclizine (ANTIVERT) 25 MG tablet Take 1 tablet  (25 mg total) by mouth 2 (two) times daily as needed for dizziness. 09/16/18   Merlyn Lot, MD  meloxicam (MOBIC) 15 MG tablet Take 15 mg by mouth daily. 02/07/19   [provider]  mometasone-formoterol (DULERA) 100-5 MCG/ACT AERO Inhale 2 puffs into the lungs 2 (two) times daily.    [provider]  polyethylene glycol (MIRALAX / GLYCOLAX) 17 g packet Take 17 g by mouth daily. 01/03/19   Earleen Newport, MD     Allergies Diphenhydramine hcl, Penicillins, Captopril, Eliquis [apixaban], Enalapril maleate, Tape, Terfenadine, Augmentin [amoxicillin-pot clavulanate], and Biaxin [clarithromycin]  Family History  Problem Relation Age of Onset  . Hypertension Mother   . Heart disease Mother   . Cancer Mother   . Stroke Father   . Hypertension Father   . Breast cancer Sister     Social History Social History   Tobacco Use  . Smoking status: Never Smoker  . Smokeless tobacco: Never Used  Substance Use Topics  . Alcohol use: No    Alcohol/week: 0.0 standard drinks  .  Drug use: No    Review of Systems  Constitutional: No fever/chills Eyes: No visual changes.  ENT: No sore throat. Cardiovascular: Denies chest pain. Respiratory: Denies shortness of breath. Gastrointestinal: No abdominal pain.   Genitourinary: Negative for dysuria. Musculoskeletal: Negative for back pain. Skin: Negative for rash. Neurological: Negative for headaches   ____________________________________________   PHYSICAL EXAM:  VITAL SIGNS: ED Triage Vitals  Enc Vitals Group     BP 04/06/19 1326 (!) 165/89     Pulse Rate 04/06/19 1326 77     Resp 04/06/19 1326 16     Temp 04/06/19 1424 97.7 F (36.5 C)     Temp Source 04/06/19 1424 Oral     SpO2 04/06/19 1326 95 %     Weight 04/06/19 1327 74 kg (163 lb 2.3 oz)     Height 04/06/19 1327 1.524 m (5')     Head Circumference --      Peak Flow --      Pain Score 04/06/19 1326 0     Pain Loc --      Pain Edu? --      Excl. in  [place]? --     Constitutional: Alert and oriented.    Nose: No congestion/rhinnorhea. Mouth/Throat: Mucous membranes are moist.    Cardiovascular: Normal rate, regular rhythm. Grossly normal heart sounds.  Good peripheral circulation. Respiratory: Normal respiratory effort.  No retractions. Lungs CTAB. Gastrointestinal: Soft and nontender. No distention.    Musculoskeletal: No lower extremity tenderness nor edema.  Warm and well perfused Neurologic:  Normal speech and language. No gross focal neurologic deficits are appreciated.  Skin:  Skin is warm, dry and intact. No rash noted. Psychiatric: Mood and affect are normal. Speech and behavior are normal.  ____________________________________________   LABS (all labs ordered are listed, but only abnormal results are displayed)  Labs Reviewed - No data to display ____________________________________________  EKG  None ____________________________________________  RADIOLOGY  None ____________________________________________   PROCEDURES  Procedure(s) performed: No  Procedures   Critical Care performed: No ____________________________________________   INITIAL IMPRESSION / ASSESSMENT AND PLAN / ED COURSE  Pertinent labs & imaging results that were available during my care of the patient were reviewed by me and considered in my medical decision making (see chart for details).  Patient well-appearing and in no acute distress.  She is currently asymptomatic and has no complaints.  Her blood pressure seems to be improving without intervention by [place].  She has follow-up with her PCP in 1 day, has had recent screening labs which I reviewed and are unremarkable.  Appropriate for discharge at this time with outpatient follow-up.  Return precautions discussed    ____________________________________________   FINAL CLINICAL IMPRESSION(S) / ED DIAGNOSES  Final diagnoses:  Essential hypertension        Note:  This  document was prepared using Dragon voice recognition software and may include unintentional dictation errors.   Lavonia Drafts, MD 04/06/19 [phone]

## 2019-04-06 NOTE — ED Notes (Signed)
ED Provider at bedside. 

## 2019-04-06 NOTE — ED Triage Notes (Signed)
Pt to ED via EMS from home c/o HTN today.  Pt states woke up feeling "off" and then when walking outside started getting shaky and felt like BP was up, checked it at home and was 195/125.  States took her regular morning medications but also took 2 extra of her clonodine and 1 xanax.  Pt denies pain or SOB.  Unable to get in touch with PCP so decided to come to ED to be checked.  Pt has hx of a.fib, denies taking thinners.  Presents A&Ox4, speaking in complete and coherent sentences, in NAD at this time.

## 2019-04-08 ENCOUNTER — Observation Stay
Admission: EM | Admit: 2019-04-08 | Discharge: 2019-04-09 | Disposition: A | Discharge status: Home / Self Care | Payer: Medicare Other | Attending: Internal Medicine | Admitting: Internal Medicine

## 2019-04-08 ENCOUNTER — Inpatient Hospital Stay: Payer: Medicare Other

## 2019-04-08 ENCOUNTER — Other Ambulatory Visit: Payer: Self-pay

## 2019-04-08 DIAGNOSIS — E785 Hyperlipidemia, unspecified: Secondary | ICD-10-CM | POA: Insufficient documentation

## 2019-04-08 DIAGNOSIS — M549 Dorsalgia, unspecified: Secondary | ICD-10-CM | POA: Diagnosis not present

## 2019-04-08 DIAGNOSIS — Z809 Family history of malignant neoplasm, unspecified: Secondary | ICD-10-CM | POA: Insufficient documentation

## 2019-04-08 DIAGNOSIS — I10 Essential (primary) hypertension: Secondary | ICD-10-CM | POA: Insufficient documentation

## 2019-04-08 DIAGNOSIS — E8770 Fluid overload, unspecified: Secondary | ICD-10-CM | POA: Insufficient documentation

## 2019-04-08 DIAGNOSIS — Z20828 Contact with and (suspected) exposure to other viral communicable diseases: Secondary | ICD-10-CM | POA: Diagnosis not present

## 2019-04-08 DIAGNOSIS — Z79899 Other long term (current) drug therapy: Secondary | ICD-10-CM | POA: Diagnosis not present

## 2019-04-08 DIAGNOSIS — Z7951 Long term (current) use of inhaled steroids: Secondary | ICD-10-CM | POA: Diagnosis not present

## 2019-04-08 DIAGNOSIS — Z881 Allergy status to other antibiotic agents status: Secondary | ICD-10-CM | POA: Diagnosis not present

## 2019-04-08 DIAGNOSIS — Z791 Long term (current) use of non-steroidal anti-inflammatories (NSAID): Secondary | ICD-10-CM | POA: Diagnosis not present

## 2019-04-08 DIAGNOSIS — Z888 Allergy status to other drugs, medicaments and biological substances status: Secondary | ICD-10-CM | POA: Insufficient documentation

## 2019-04-08 DIAGNOSIS — E039 Hypothyroidism, unspecified: Secondary | ICD-10-CM | POA: Diagnosis not present

## 2019-04-08 DIAGNOSIS — Z8249 Family history of ischemic heart disease and other diseases of the circulatory system: Secondary | ICD-10-CM | POA: Insufficient documentation

## 2019-04-08 DIAGNOSIS — Z853 Personal history of malignant neoplasm of breast: Secondary | ICD-10-CM | POA: Insufficient documentation

## 2019-04-08 DIAGNOSIS — G8929 Other chronic pain: Secondary | ICD-10-CM | POA: Insufficient documentation

## 2019-04-08 DIAGNOSIS — J449 Chronic obstructive pulmonary disease, unspecified: Secondary | ICD-10-CM | POA: Diagnosis not present

## 2019-04-08 DIAGNOSIS — I480 Paroxysmal atrial fibrillation: Secondary | ICD-10-CM | POA: Diagnosis not present

## 2019-04-08 DIAGNOSIS — Z79890 Hormone replacement therapy (postmenopausal): Secondary | ICD-10-CM | POA: Insufficient documentation

## 2019-04-08 DIAGNOSIS — E871 Hypo-osmolality and hyponatremia: Principal | ICD-10-CM | POA: Diagnosis present

## 2019-04-08 DIAGNOSIS — R059 Cough, unspecified: Secondary | ICD-10-CM

## 2019-04-08 DIAGNOSIS — F419 Anxiety disorder, unspecified: Secondary | ICD-10-CM | POA: Diagnosis not present

## 2019-04-08 DIAGNOSIS — Z880 Allergy status to penicillin: Secondary | ICD-10-CM | POA: Diagnosis not present

## 2019-04-08 DIAGNOSIS — R05 Cough: Secondary | ICD-10-CM

## 2019-04-08 LAB — CBC
HCT: 39.1 % (ref 36.0–46.0)
Hemoglobin: 13.5 g/dL (ref 12.0–15.0)
MCH: 30.4 pg (ref 26.0–34.0)
MCHC: 34.5 g/dL (ref 30.0–36.0)
MCV: 88.1 fL (ref 80.0–100.0)
Platelets: 243 10*3/uL (ref 150–400)
RBC: 4.44 MIL/uL (ref 3.87–5.11)
RDW: 12.6 % (ref 11.5–15.5)
WBC: 8.2 10*3/uL (ref 4.0–10.5)
nRBC: 0 % (ref 0.0–0.2)

## 2019-04-08 LAB — BASIC METABOLIC PANEL
Anion gap: 14 (ref 5–15)
BUN: 13 mg/dL (ref 8–23)
CO2: 20 mmol/L — ABNORMAL LOW (ref 22–32)
Calcium: 9.1 mg/dL (ref 8.9–10.3)
Chloride: 88 mmol/L — ABNORMAL LOW (ref 98–111)
Creatinine, Ser: 0.72 mg/dL (ref 0.44–1.00)
GFR calc Af Amer: >60 mL/min (ref >60)
GFR calc non Af Amer: >60 mL/min (ref >60)
Glucose, Bld: 108 mg/dL — ABNORMAL HIGH (ref 70–99)
Potassium: 4.9 mmol/L (ref 3.5–5.1)
Sodium: 122 mmol/L — ABNORMAL LOW (ref 135–145)

## 2019-04-08 LAB — TROPONIN I (HIGH SENSITIVITY)
Troponin I (High Sensitivity): 7 ng/L (ref <18)
Troponin I (High Sensitivity): 8 ng/L (ref <18)

## 2019-04-08 MED ORDER — HYDRALAZINE HCL 50 MG PO TABS
50.0000 mg | ORAL_TABLET | Freq: Three times a day (TID) | ORAL | Status: DC
  Administered 2019-04-08 – 2019-04-09 (×2): 50 mg via ORAL
  Filled 2019-04-08 (×2): qty 1

## 2019-04-08 MED ORDER — ALPRAZOLAM 0.5 MG PO TABS
0.5000 mg | ORAL_TABLET | Freq: Two times a day (BID) | ORAL | Status: DC | PRN
  Administered 2019-04-09: 0.25 mg via ORAL
  Filled 2019-04-08: qty 1

## 2019-04-08 MED ORDER — ALBUTEROL SULFATE (2.5 MG/3ML) 0.083% IN NEBU: 2.5000 mg | INHALATION_SOLUTION | Freq: Four times a day (QID) | RESPIRATORY_TRACT | Status: DC | PRN

## 2019-04-08 MED ORDER — ATORVASTATIN CALCIUM 20 MG PO TABS
40.0000 mg | ORAL_TABLET | Freq: Every day | ORAL | Status: DC
  Administered 2019-04-08: 40 mg via ORAL
  Filled 2019-04-08: qty 2

## 2019-04-08 MED ORDER — DICLOFENAC SODIUM 1 % TD GEL
2.0000 g | Freq: Four times a day (QID) | TRANSDERMAL | Status: DC
  Administered 2019-04-09: 2 g via TOPICAL
  Filled 2019-04-08: qty 100

## 2019-04-08 MED ORDER — ENOXAPARIN SODIUM 40 MG/0.4ML ~~LOC~~ SOLN
40.0000 mg | Freq: Every day | SUBCUTANEOUS | Status: DC
  Administered 2019-04-08: 40 mg via SUBCUTANEOUS
  Filled 2019-04-08: qty 0.4

## 2019-04-08 MED ORDER — LEVOTHYROXINE SODIUM 50 MCG PO TABS
50.0000 ug | ORAL_TABLET | Freq: Every day | ORAL | Status: DC
  Administered 2019-04-09: 50 ug via ORAL
  Filled 2019-04-08: qty 1

## 2019-04-08 MED ORDER — ONDANSETRON HCL 4 MG PO TABS: 4.0000 mg | ORAL_TABLET | Freq: Four times a day (QID) | ORAL | Status: DC | PRN

## 2019-04-08 MED ORDER — TOLVAPTAN 15 MG PO TABS
15.0000 mg | ORAL_TABLET | Freq: Once | ORAL | Status: AC
  Administered 2019-04-08: 15 mg via ORAL
  Filled 2019-04-08: qty 1

## 2019-04-08 MED ORDER — OXYCODONE-ACETAMINOPHEN 5-325 MG PO TABS
1.0000 | ORAL_TABLET | Freq: Every day | ORAL | Status: DC
  Administered 2019-04-08 – 2019-04-09 (×2): 1 via ORAL
  Filled 2019-04-08 (×2): qty 1

## 2019-04-08 MED ORDER — ACETAMINOPHEN 325 MG PO TABS: 650.0000 mg | ORAL_TABLET | Freq: Four times a day (QID) | ORAL | Status: DC | PRN

## 2019-04-08 MED ORDER — BUSPIRONE HCL 10 MG PO TABS
10.0000 mg | ORAL_TABLET | Freq: Two times a day (BID) | ORAL | Status: DC
  Administered 2019-04-08 – 2019-04-09 (×2): 10 mg via ORAL
  Filled 2019-04-08 (×2): qty 1

## 2019-04-08 MED ORDER — LORATADINE 10 MG PO TABS
10.0000 mg | ORAL_TABLET | Freq: Every day | ORAL | Status: DC
  Administered 2019-04-09: 10 mg via ORAL
  Filled 2019-04-08: qty 1

## 2019-04-08 MED ORDER — POLYETHYLENE GLYCOL 3350 17 G PO PACK
17.0000 g | PACK | Freq: Every day | ORAL | Status: DC
  Administered 2019-04-09: 17 g via ORAL
  Filled 2019-04-08: qty 1

## 2019-04-08 MED ORDER — MOMETASONE FURO-FORMOTEROL FUM 200-5 MCG/ACT IN AERO
2.0000 | INHALATION_SPRAY | Freq: Two times a day (BID) | RESPIRATORY_TRACT | Status: DC
  Administered 2019-04-08 – 2019-04-09 (×2): 2 via RESPIRATORY_TRACT
  Filled 2019-04-08: qty 8.8

## 2019-04-08 MED ORDER — VITAMIN D 25 MCG (1000 UNIT) PO TABS
1000.0000 [IU] | ORAL_TABLET | Freq: Every day | ORAL | Status: DC
  Administered 2019-04-09: 1000 [IU] via ORAL
  Filled 2019-04-08: qty 1

## 2019-04-08 MED ORDER — FUROSEMIDE 20 MG PO TABS
20.0000 mg | ORAL_TABLET | Freq: Once | ORAL | Status: AC
  Administered 2019-04-08: 20 mg via ORAL
  Filled 2019-04-08: qty 1

## 2019-04-08 MED ORDER — HYDRALAZINE HCL 20 MG/ML IJ SOLN
5.0000 mg | INTRAMUSCULAR | Status: DC | PRN
  Administered 2019-04-09: 5 mg via INTRAVENOUS
  Filled 2019-04-08: qty 1

## 2019-04-08 MED ORDER — ONDANSETRON HCL 4 MG/2ML IJ SOLN: 4.0000 mg | Freq: Four times a day (QID) | INTRAMUSCULAR | Status: DC | PRN

## 2019-04-08 MED ORDER — PANTOPRAZOLE SODIUM 40 MG PO TBEC
40.0000 mg | DELAYED_RELEASE_TABLET | Freq: Every day | ORAL | Status: DC
  Administered 2019-04-08 – 2019-04-09 (×2): 40 mg via ORAL
  Filled 2019-04-08 (×2): qty 1

## 2019-04-08 MED ORDER — LOSARTAN POTASSIUM 50 MG PO TABS: 100.0000 mg | ORAL_TABLET | Freq: Every day | ORAL | Status: DC

## 2019-04-08 MED ORDER — FERROUS SULFATE 325 (65 FE) MG PO TABS
325.0000 mg | ORAL_TABLET | Freq: Every day | ORAL | Status: DC
  Administered 2019-04-09: 08:00:00 325 mg via ORAL
  Filled 2019-04-08: qty 1

## 2019-04-08 MED ORDER — ACETAMINOPHEN 650 MG RE SUPP: 650.0000 mg | Freq: Four times a day (QID) | RECTAL | Status: DC | PRN

## 2019-04-08 MED ORDER — POLYETHYLENE GLYCOL 3350 17 G PO PACK: 17.0000 g | PACK | Freq: Every day | ORAL | Status: DC | PRN

## 2019-04-08 MED ORDER — DILTIAZEM HCL ER COATED BEADS 120 MG PO CP24
120.0000 mg | ORAL_CAPSULE | Freq: Every day | ORAL | Status: DC
  Filled 2019-04-08: qty 1

## 2019-04-08 NOTE — ED Provider Notes (Signed)
[facility] Emergency Department Provider Note   ____________________________________________   First MD Initiated Contact with Patient 04/08/19 1804     (approximate)  I have reviewed the triage vital signs and the nursing notes.   HISTORY  Chief Complaint Hypertension    HPI Janice Perkins is a 85 y.o. female denies history of COVID exposure.  Denies cough cold fevers or chills.  Patient history of A. fib, hypertension.  Reports recent change in her medication.  She been using clonidine for elevated blood pressures as high as 200 at home recently  Denies headache.  No numbness tingling or weakness.  She does however report for the last few days she just felt a little bit like sometimes it is little harder for her to recall things, and she reports that this happen once before when she was drinking too much water.  She also reports she started drinking a lot of water once again a few days ago  Slight nausea.  No vomiting.  No chest pain no trouble breathing.   Past Medical History:  Diagnosis Date  . Asthma   . Atrial fibrillation ([place])   . Breast cancer ([place]) 2011  . Bronchitis 04/2015  . Cancer [facility]) 2011   breast  . COPD (chronic obstructive pulmonary disease) ([place])   . Hypertension   . Shingles    10/2015    Patient Active Problem List   Diagnosis Date Noted  . Duodenum ulcer   . Melena   . GI bleed 02/17/2019  . Hypertensive encephalopathy 12/11/2018  . Hypoxia 05/30/2018  . Leukocytosis 04/05/2018  . Pneumonia 11/15/2017  . Pleural effusion 05/16/2017  . AF (paroxysmal atrial fibrillation) ([place]) 05/16/2017  . Breast cancer ([place]) 05/16/2017  . Dependence on nocturnal oxygen therapy 05/16/2017  . HTN (hypertension) 05/16/2017  . Generalized weakness 03/07/2017  . Hyponatremia 03/07/2017  . Dehydration 03/07/2017  . UTI (urinary tract infection) 03/07/2017  . HCAP (healthcare-associated pneumonia) 01/19/2017  . AKI (acute kidney  injury) ([place]) 12/18/2016  . Primary cancer of upper outer quadrant of right female breast ([place]) 04/07/2016  . Lumbar radiculopathy 03/23/2016  . Spinal stenosis, lumbar region, with neurogenic claudication 03/23/2016  . DDD (degenerative disc disease), lumbar 01/14/2015  . Lumbosacral facet joint syndrome 01/14/2015  . Sacroiliac joint dysfunction 01/14/2015  . Greater trochanteric bursitis 01/14/2015  . Bilateral occipital neuralgia 01/14/2015    Past Surgical History:  Procedure Laterality Date  . BREAST EXCISIONAL BIOPSY Right 11/29/13   two areas FAT NECROSIS  . BREAST EXCISIONAL BIOPSY Right 10/30/2009   lumpectomy - radiation  . COLONOSCOPY WITH PROPOFOL N/A 06/10/2018   Procedure: COLONOSCOPY WITH PROPOFOL;  Surgeon: Manya Silvas, MD;  Location: [facility] ENDOSCOPY;  Service: Endoscopy;  Laterality: N/A;  . COLONOSCOPY WITH PROPOFOL N/A 02/20/2019   Procedure: COLONOSCOPY WITH PROPOFOL;  Surgeon: Lucilla Lame, MD;  Location: [facility] ENDOSCOPY;  Service: Endoscopy;  Laterality: N/A;  . ESOPHAGOGASTRODUODENOSCOPY (EGD) WITH PROPOFOL N/A 06/10/2018   Procedure: ESOPHAGOGASTRODUODENOSCOPY (EGD) WITH PROPOFOL;  Surgeon: Manya Silvas, MD;  Location: [facility] ENDOSCOPY;  Service: Endoscopy;  Laterality: N/A;  . ESOPHAGOGASTRODUODENOSCOPY (EGD) WITH PROPOFOL N/A 02/20/2019   Procedure: ESOPHAGOGASTRODUODENOSCOPY (EGD) WITH PROPOFOL;  Surgeon: Lucilla Lame, MD;  Location: [facility] ENDOSCOPY;  Service: Endoscopy;  Laterality: N/A;  . NASAL SINUS SURGERY      Prior to Admission medications   Medication Sig Start Date End Date Taking? Authorizing Provider  albuterol (ACCUNEB) 1.25 MG/3ML nebulizer solution Inhale 3 mLs into the lungs  every 6 (six) hours as needed for wheezing.     [provider]  albuterol (PROVENTIL HFA;VENTOLIN HFA) 108 (90 Base) MCG/ACT inhaler Inhale 2 puffs into the lungs every 6 (six) hours as needed for wheezing or shortness of breath.     [provider]   alendronate (FOSAMAX) 70 MG tablet Take 70 mg by mouth once a week. 02/01/19 05/02/19  [provider]  ALPRAZolam Duanne Moron) 0.5 MG tablet Take 0.25-0.5 mg by mouth 2 (two) times a day. Take 0.25 mg in the morning, then 0.5 mg at night    [provider]  atorvastatin (LIPITOR) 20 MG tablet Take 20 mg by mouth at bedtime.     [provider]  busPIRone (BUSPAR) 10 MG tablet Take 10 mg by mouth 2 (two) times daily.    [provider]  cholecalciferol (VITAMIN D) 1000 units tablet Take 1,000 Units by mouth daily.    [provider]  cloNIDine (CATAPRES) 0.1 MG tablet Take 0.1 mg by mouth 2 (two) times daily as needed. 02/13/19   [provider]  diclofenac sodium (VOLTAREN) 1 % GEL Apply 2 g topically 4 (four) times daily. 02/01/19   [provider]  diltiazem (DILACOR XR) 120 MG 24 hr capsule Take 120 mg by mouth daily.    [provider]  escitalopram (LEXAPRO) 5 MG tablet Take 5 mg by mouth daily.    [provider]  esomeprazole (NEXIUM) 40 MG capsule Take 40 mg by mouth 2 (two) times daily before a meal.     [provider]  ferrous sulfate 325 (65 FE) MG tablet Take 325 mg by mouth daily with breakfast.    [provider]  Fluticasone-Salmeterol (ADVAIR) 250-50 MCG/DOSE AEPB Inhale 1 puff into the lungs 2 (two) times daily.    [provider]  hydrALAZINE (APRESOLINE) 10 MG tablet Take 5 tablets (50 mg total) by mouth 3 (three) times daily. 02/21/19   Fritzi Mandes, MD  HYDROcodone-acetaminophen (NORCO/VICODIN) 5-325 MG tablet Take 1 tablet by mouth 2 (two) times daily as needed for moderate pain or severe pain.     [provider]  levothyroxine (SYNTHROID, LEVOTHROID) 50 MCG tablet Take 50 mcg by mouth daily before breakfast.    [provider]  loratadine (CLARITIN) 10 MG tablet Take 10 mg by mouth daily.    [provider]  losartan (COZAAR) 50 MG tablet Take 2 tablets  (100 mg total) by mouth at bedtime. 12/13/18   Loletha Grayer, MD  magnesium oxide (MAG-OX) 400 MG tablet Take 400 mg by mouth 2 (two) times daily.    [provider]  meclizine (ANTIVERT) 25 MG tablet Take 1 tablet (25 mg total) by mouth 2 (two) times daily as needed for dizziness. 09/16/18   Merlyn Lot, MD  meloxicam (MOBIC) 15 MG tablet Take 15 mg by mouth daily. 02/07/19   [provider]  mometasone-formoterol (DULERA) 100-5 MCG/ACT AERO Inhale 2 puffs into the lungs 2 (two) times daily.    [provider]  polyethylene glycol (MIRALAX / GLYCOLAX) 17 g packet Take 17 g by mouth daily. 01/03/19   Earleen Newport, MD    Allergies Diphenhydramine hcl, Penicillins, Captopril, Eliquis [apixaban], Enalapril maleate, Tape, Terfenadine, Augmentin [amoxicillin-pot clavulanate], and Biaxin [clarithromycin]  Family History  Problem Relation Age of Onset  . Hypertension Mother   . Heart disease Mother   . Cancer Mother   . Stroke Father   . Hypertension Father   .  Breast cancer Sister     Social History Social History   Tobacco Use  . Smoking status: Never Smoker  . Smokeless tobacco: Never Used  Substance Use Topics  . Alcohol use: No    Alcohol/week: 0.0 standard drinks  . Drug use: No    Review of Systems Constitutional: No fever/chills Eyes: No visual changes. ENT: No sore throat. Cardiovascular: Denies chest pain. Respiratory: Denies shortness of breath. Gastrointestinal: No abdominal pain.  Slight nausea.  Reports she is gone back to drinking quite a bit of water, drinks well over 40 ounces a day. Genitourinary: Negative for dysuria. Musculoskeletal: Negative for back pain. Skin: Negative for rash. Neurological: Negative for headaches, areas of focal weakness or numbness.  She reports that her thoughts do seem little bit fuzzy her last few days also when she saw her doctor a few days ago    ____________________________________________    PHYSICAL EXAM:  VITAL SIGNS: ED Triage Vitals  Enc Vitals Group     BP 04/08/19 1502 (!) 158/73     Pulse Rate 04/08/19 1502 68     Resp 04/08/19 1502 20     Temp 04/08/19 1502 98.6 F (37 C)     Temp src --      SpO2 04/08/19 1502 98 %     Weight 04/08/19 1504 163 lb (73.9 kg)     Height 04/08/19 1504 5\' 2"  (1.575 m)     Head Circumference --      Peak Flow --      Pain Score 04/08/19 1518 0     Pain Loc --      Pain Edu? --      Excl. in [place]? --     Constitutional: Alert and oriented. Well appearing and in no acute distress. Eyes: Conjunctivae are normal. Head: Atraumatic. Nose: No congestion/rhinnorhea. Mouth/Throat: Mucous membranes are moist. Neck: No stridor.  Cardiovascular: Normal rate, irregular rhythm. Grossly normal heart sounds.  Good peripheral circulation. Respiratory: Normal respiratory effort.  No retractions. Lungs CTAB. Gastrointestinal: Soft and nontender. No distention. Musculoskeletal: No lower extremity tenderness nor edema. Neurologic:  Normal speech and language. No gross focal neurologic deficits are appreciated.  Skin:  Skin is warm, dry and intact. No rash noted. Psychiatric: Mood and affect are normal. Speech and behavior are normal.  ____________________________________________   LABS (all labs ordered are listed, but only abnormal results are displayed)  Labs Reviewed  BASIC METABOLIC PANEL - Abnormal; Notable for the following components:      Result Value   Sodium 122 (*)    Chloride 88 (*)    CO2 20 (*)    Glucose, Bld 108 (*)    All other components within normal limits  SARS CORONAVIRUS 2  CBC  TROPONIN I (HIGH SENSITIVITY)  TROPONIN I (HIGH SENSITIVITY)   ____________________________________________  EKG  Reviewed interpreted at 1530 Heart rate 55 QRS 90 QTc 420 A. fib, rate controlled.  Slightly irregular.  No evidence of acute ischemia ____________________________________________  RADIOLOGY    ____________________________________________   PROCEDURES  Procedure(s) performed: None  Procedures  Critical Care performed: No  ____________________________________________   INITIAL IMPRESSION / ASSESSMENT AND PLAN / ED COURSE  Pertinent labs & imaging results that were available during my care of the patient were reviewed by me and considered in my medical decision making (see chart for details).   Patient presents with hyponatremia.  She appears euvolemic but her clinical history of drinking increased fluids and intake suggest likely volume  overload.  I will fluid restrict her at this point.  She is alert oriented but does report she just feels cognitively like slightly slowed over the last several days.  Suspect these are symptoms related to hypo-natremia.  Blood pressure minimally elevated here but does use clonidine at home.  No headache, no gross neurologic deficits.  SAKARI HAVRILLA was evaluated in Emergency Department on 04/08/2019 for the symptoms described in the history of present illness. She was evaluated in the context of the global COVID-19 pandemic, which necessitated consideration that the patient might be at risk for infection with the SARS-CoV-2 virus that causes COVID-19. Institutional protocols and algorithms that pertain to the evaluation of patients at risk for COVID-19 are in a state of rapid change based on information released by regulatory bodies including the CDC and federal and state organizations. These policies and algorithms were followed during the patient's care in the ED.     I do not see indication to treat the patient with saline, hypertonic saline or super salt at this time.  Discussed case with Dr. Barnetta Chapel, will bring to the hospitalist service for for further work-up and treatment of hyponatremia.  Patient agreeable understanding of plan  ____________________________________________   FINAL CLINICAL IMPRESSION(S) / ED DIAGNOSES  Final  diagnoses:  Hyponatremia        Note:  This document was prepared using Dragon voice recognition software and may include unintentional dictation errors       Delman Kitten, MD 04/08/19 1810

## 2019-04-08 NOTE — ED Triage Notes (Signed)
Pt presents via POV c/o HTN. Reports meds adjusted by PCP with BP medication adjustment. Reports feeling sick on stomach last night. Denies pain.

## 2019-04-08 NOTE — ED Notes (Signed)
Pt ambulated to bedside commode with minimal assistance.  

## 2019-04-08 NOTE — Progress Notes (Signed)
Family Meeting Note  Advance Directive:no  Today a meeting took place with the Patient and spouse.  Patient is able to participate.  The following clinical team members were present during this meeting:MD  The following were discussed:Patient's diagnosis: hyponatremia, Patient's progosis: Unable to determine and Goals for treatment: Full Code  Additional follow-up to be provided: prn  Time spent during discussion:20 minutes  Evette Doffing, MD

## 2019-04-08 NOTE — ED Notes (Signed)
ED TO INPATIENT HANDOFF REPORT  ED Nurse Name and Phone #:  Quillian Quince [phone]  S Name/Age/Gender Octavio Graves 85 y.o. female Room/Bed: ED06A/ED06A  Code Status   Code Status: Prior  Home/SNF/Other Home Patient oriented to: self, place, time and situation Is this baseline? Yes   Triage Complete: Triage complete  Chief Complaint blood pressure up and down  Triage Note Pt presents via POV c/o HTN. Reports meds adjusted by PCP with BP medication adjustment. Reports feeling sick on stomach last night. Denies pain.    Allergies Allergies  Allergen Reactions  . Diphenhydramine Hcl Rash  . Penicillins Hives    .Has patient had a PCN reaction causing immediate rash, facial/tongue/throat swelling, SOB or lightheadedness with hypotension: Unknown Has patient had a PCN reaction causing severe rash involving mucus membranes or skin necrosis: Unknown Has patient had a PCN reaction that required hospitalization: Unknown Has patient had a PCN reaction occurring within the last 10 years: Unknown If all of the above answers are "NO", then may proceed with Cephalosporin use.   . Captopril Other (See Comments)  . Eliquis [Apixaban] Other (See Comments)    Stomach bleed  . Enalapril Maleate Other (See Comments)    Other reaction(s): Unknown  . Tape Itching  . Terfenadine Other (See Comments)  . Augmentin [Amoxicillin-Pot Clavulanate] Rash    Has patient had a PCN reaction causing immediate rash, facial/tongue/throat swelling, SOB or lightheadedness with hypotension: Unknown Has patient had a PCN reaction causing severe rash involving mucus membranes or skin necrosis: Unknown Has patient had a PCN reaction that required hospitalization: Unknown Has patient had a PCN reaction occurring within the last 10 years: Unknown If all of the above answers are "NO", then may proceed with Cephalosporin use.  . Biaxin [Clarithromycin] Rash, Other (See Comments) and Hives    Other reaction(s):  Unknown    Level of Care/Admitting Diagnosis ED Disposition    ED Disposition Condition [place]: [place] [[id number]]  Level of Care: Med-Surg [16]  Covid Evaluation: Asymptomatic Screening Protocol (No Symptoms)  Diagnosis: Hyponatremia [id number]  Admitting Physician: Hyman Bible DODD [id number]  Attending Physician: Hyman Bible DODD [id number]  Estimated length of stay: past midnight tomorrow  Certification:: I certify this patient will need inpatient services for at least 2 midnights  PT Class (Do Not Modify): Inpatient [101]  PT Acc Code (Do Not Modify): Private [1]       B Medical/Surgery History Past Medical History:  Diagnosis Date  . Asthma   . Atrial fibrillation ([place])   . Breast cancer ([place]) 2011  . Bronchitis 04/2015  . Cancer [facility]) 2011   breast  . COPD (chronic obstructive pulmonary disease) ([place])   . Hypertension   . Shingles    10/2015   Past Surgical History:  Procedure Laterality Date  . BREAST EXCISIONAL BIOPSY Right 11/29/13   two areas FAT NECROSIS  . BREAST EXCISIONAL BIOPSY Right 10/30/2009   lumpectomy - radiation  . COLONOSCOPY WITH PROPOFOL N/A 06/10/2018   Procedure: COLONOSCOPY WITH PROPOFOL;  Surgeon: Manya Silvas, MD;  Location[facility] ENDOSCOPY;  Service: Endoscopy;  Laterality: N/A;  . COLONOSCOPY WITH PROPOFOL N/A 02/20/2019   Procedure: COLONOSCOPY WITH PROPOFOL;  Surgeon: Lucilla Lame, MD;  Location[facility] ENDOSCOPY;  Service: Endoscopy;  Laterality: N/A;  . ESOPHAGOGASTRODUODENOSCOPY (EGD) WITH PROPOFOL N/A 06/10/2018   Procedure: ESOPHAGOGASTRODUODENOSCOPY (EGD) WITH PROPOFOL;  Surgeon: Manya Silvas, MD;  Location: [facility] ENDOSCOPY;  Service:  Endoscopy;  Laterality: N/A;  . ESOPHAGOGASTRODUODENOSCOPY (EGD) WITH PROPOFOL N/A 02/20/2019   Procedure: ESOPHAGOGASTRODUODENOSCOPY (EGD) WITH PROPOFOL;  Surgeon: Lucilla Lame, MD;  Location: [facility] ENDOSCOPY;  Service: Endoscopy;  Laterality: N/A;  . NASAL  SINUS SURGERY       A IV Location/Drains/Wounds Patient Lines/Drains/Airways Status   Active Line/Drains/Airways    Name:   Placement date:   Placement time:   Site:   Days:   Peripheral IV 04/08/19 Left;Anterior Forearm   04/08/19    1911    Forearm   less than 1          Intake/Output Last 24 hours No intake or output data in the 24 hours ending 04/08/19 2057  Labs/Imaging Results for orders placed or performed during the hospital encounter of 04/08/19 (from the past 48 hour(s))  Basic metabolic panel     Status: Abnormal   Collection Time: 04/08/19  3:41 PM  Result Value Ref Range   Sodium 122 (L) 135 - 145 mmol/L   Potassium 4.9 3.5 - 5.1 mmol/L    Comment: HEMOLYSIS AT THIS LEVEL MAY AFFECT RESULT   Chloride 88 (L) 98 - 111 mmol/L   CO2 20 (L) 22 - 32 mmol/L   Glucose, Bld 108 (H) 70 - 99 mg/dL   BUN 13 8 - 23 mg/dL   Creatinine, Ser 0.72 0.44 - 1.00 mg/dL   Calcium 9.1 8.9 - 10.3 mg/dL   GFR calc non Af Amer >60 >60 mL/min   GFR calc Af Amer >60 >60 mL/min   Anion gap 14 5 - 15    Comment: Performed at [facility], [address] [place], [place] [address]  CBC     Status: None   Collection Time: 04/08/19  3:41 PM  Result Value Ref Range   WBC 8.2 4.0 - 10.5 K/uL   RBC 4.44 3.87 - 5.11 MIL/uL   Hemoglobin 13.5 12.0 - 15.0 g/dL   HCT 39.1 36.0 - 46.0 %   MCV 88.1 80.0 - 100.0 fL   MCH 30.4 26.0 - 34.0 pg   MCHC 34.5 30.0 - 36.0 g/dL   RDW 12.6 11.5 - 15.5 %   Platelets 243 150 - 400 K/uL   nRBC 0.0 0.0 - 0.2 %    Comment: Performed at [facility], [place], [place] [address]  Troponin I (High Sensitivity)     Status: None   Collection Time: 04/08/19  3:41 PM  Result Value Ref Range   Troponin I (High Sensitivity) 7 <18 ng/L    Comment: (NOTE) Elevated high sensitivity troponin I (hsTnI) values and significant  changes across serial measurements may suggest ACS but many other  chronic and acute conditions are known to  elevate hsTnI results.  Refer to the "Links" section for chest pain algorithms and additional  guidance. Performed at [facility], [place], [place] [address]   Troponin I (High Sensitivity)     Status: None   Collection Time: 04/08/19  7:07 PM  Result Value Ref Range   Troponin I (High Sensitivity) 8 <18 ng/L    Comment: (NOTE) Elevated high sensitivity troponin I (hsTnI) values and significant  changes across serial measurements may suggest ACS but many other  chronic and acute conditions are known to elevate hsTnI results.  Refer to the "Links" section for chest pain algorithms and additional  guidance. Performed at [facility], [address] [place], [place] [address]  No results found.  Pending Labs [employer] (From admission, onward)    Start     Ordered   04/08/19 1807  SARS CORONAVIRUS 2 Nasal Swab Aptima Multi Swab  (Asymptomatic/Tier 2 Patients Labs)  ONCE - STAT,   STAT    Question Answer Comment  Is this test for diagnosis or screening Screening   Symptomatic for COVID-19 as defined by CDC No   Hospitalized for COVID-19 No   Admitted to ICU for COVID-19 No   Previously tested for COVID-19 Yes   Resident in a congregate (group) care setting No   Employed in healthcare setting No   Pregnant No      04/08/19 1806   Signed and Held  Basic metabolic panel  Tomorrow morning,   R     Signed and Held   Signed and Held  CBC  Tomorrow morning,   R     Signed and Held   Signed and Held  Sodium, urine, random  Once,   R     Signed and Held   Signed and Held  Osmolality, urine  Once,   R     Signed and Held          Vitals/Pain Today's Vitals   04/08/19 1519 04/08/19 1801 04/08/19 1802 04/08/19 1930  BP:  (!) 187/89  (!) 160/76  Pulse:  60 64 (!) 56  Resp:  18 19 17   Temp:      SpO2:  99% 99% 99%  Weight: 73.9 kg     Height:      PainSc:        Isolation Precautions No active  isolations  Medications Medications  furosemide (LASIX) tablet 20 mg (has no administration in time range)  tolvaptan (SAMSCA) tablet 15 mg (has no administration in time range)    Mobility walks Low fall risk   Focused Assessments Neuro Assessment Handoff:  Swallow screen pass? N/A         Neuro Assessment: Within Defined Limits Neuro Checks:      Last Documented NIHSS Modified Score:   Has TPA been given? No If patient is a Neuro Trauma and patient is going to OR before floor call report to [place] nurse: [phone] or [phone]     R Recommendations: See Admitting Provider Note  Report given to:   Additional Notes:

## 2019-04-08 NOTE — ED Notes (Signed)
Daniel RN, aware of bed assigned 

## 2019-04-08 NOTE — ED Notes (Signed)
Patient's spouse leaving room.

## 2019-04-08 NOTE — H&P (Addendum)
[employer] [place] NAME: Janice Perkins    MR#:  [id number]  DATE OF BIRTH:  16-Oct-1932  DATE OF ADMISSION:  04/08/2019  PRIMARY CARE PHYSICIAN: [name], MD   REQUESTING/REFERRING PHYSICIAN: Delman Kitten, MD  CHIEF COMPLAINT:   Chief Complaint  Patient presents with  . Hypertension    HISTORY OF PRESENT ILLNESS:  Janice Perkins  is a 85 y.o. female with a known history of atrial fibrillation, breast cancer, COPD, hypertension, asthma who presented to the ED with generalized weakness and "feeling really sick" for the last couple of days.  Patient has a history of hyponatremia and was previously fluid restricting to 40 ounces of fluid total for the day.  About 1 month ago, she stopped doing her fluid restriction and has been drinking a ton of water.  Patient states she has no idea how much she has been drinking.  She also endorses polyuria.  She denies any chest pain, shortness of breath, focal weakness, numbness, or tingling.  She endorses cough productive of clear sputum.  In the ED, she was mildly hypertensive.  Labs are significant for sodium 122.  Hospitalists were called for admission.  PAST MEDICAL HISTORY:   Past Medical History:  Diagnosis Date  . Asthma   . Atrial fibrillation ([place])   . Breast cancer ([place]) 2011  . Bronchitis 04/2015  . Cancer [facility]) 2011   breast  . COPD (chronic obstructive pulmonary disease) ([place])   . Hypertension   . Shingles    10/2015    PAST SURGICAL HISTORY:   Past Surgical History:  Procedure Laterality Date  . BREAST EXCISIONAL BIOPSY Right 11/29/13   two areas FAT NECROSIS  . BREAST EXCISIONAL BIOPSY Right 10/30/2009   lumpectomy - radiation  . COLONOSCOPY WITH PROPOFOL N/A 06/10/2018   Procedure: COLONOSCOPY WITH PROPOFOL;  Surgeon: Manya Silvas, MD;  Location: [facility] ENDOSCOPY;  Service: Endoscopy;  Laterality: N/A;  . COLONOSCOPY WITH PROPOFOL N/A 02/20/2019   Procedure: COLONOSCOPY  WITH PROPOFOL;  Surgeon: Lucilla Lame, MD;  Location: [facility] ENDOSCOPY;  Service: Endoscopy;  Laterality: N/A;  . ESOPHAGOGASTRODUODENOSCOPY (EGD) WITH PROPOFOL N/A 06/10/2018   Procedure: ESOPHAGOGASTRODUODENOSCOPY (EGD) WITH PROPOFOL;  Surgeon: Manya Silvas, MD;  Location: [facility] ENDOSCOPY;  Service: Endoscopy;  Laterality: N/A;  . ESOPHAGOGASTRODUODENOSCOPY (EGD) WITH PROPOFOL N/A 02/20/2019   Procedure: ESOPHAGOGASTRODUODENOSCOPY (EGD) WITH PROPOFOL;  Surgeon: Lucilla Lame, MD;  Location: [facility] ENDOSCOPY;  Service: Endoscopy;  Laterality: N/A;  . NASAL SINUS SURGERY      SOCIAL HISTORY:   Social History   Tobacco Use  . Smoking status: Never Smoker  . Smokeless tobacco: Never Used  Substance Use Topics  . Alcohol use: No    Alcohol/week: 0.0 standard drinks    FAMILY HISTORY:   Family History  Problem Relation Age of Onset  . Hypertension Mother   . Heart disease Mother   . Cancer Mother   . Stroke Father   . Hypertension Father   . Breast cancer Sister     DRUG ALLERGIES:   Allergies  Allergen Reactions  . Diphenhydramine Hcl Rash  . Penicillins Hives    .Has patient had a PCN reaction causing immediate rash, facial/tongue/throat swelling, SOB or lightheadedness with hypotension: Unknown Has patient had a PCN reaction causing severe rash involving mucus membranes or skin necrosis: Unknown Has patient had a PCN reaction that required hospitalization: Unknown Has patient had a PCN reaction occurring within the last 10 years:  Unknown If all of the above answers are "NO", then may proceed with Cephalosporin use.   . Captopril Other (See Comments)  . Eliquis [Apixaban] Other (See Comments)    Stomach bleed  . Enalapril Maleate Other (See Comments)    Other reaction(s): Unknown  . Tape Itching  . Terfenadine Other (See Comments)  . Augmentin [Amoxicillin-Pot Clavulanate] Rash    Has patient had a PCN reaction causing immediate rash, facial/tongue/throat swelling, SOB  or lightheadedness with hypotension: Unknown Has patient had a PCN reaction causing severe rash involving mucus membranes or skin necrosis: Unknown Has patient had a PCN reaction that required hospitalization: Unknown Has patient had a PCN reaction occurring within the last 10 years: Unknown If all of the above answers are "NO", then may proceed with Cephalosporin use.  . Biaxin [Clarithromycin] Rash, Other (See Comments) and Hives    Other reaction(s): Unknown    REVIEW OF SYSTEMS:   Review of Systems  Constitutional: Positive for malaise/fatigue. Negative for chills and fever.  HENT: Negative for congestion and sore throat.   Eyes: Negative for blurred vision and double vision.  Respiratory: Positive for cough and sputum production. Negative for shortness of breath.   Cardiovascular: Negative for chest pain and palpitations.  Gastrointestinal: Negative for nausea and vomiting.  Genitourinary: Positive for frequency. Negative for dysuria and urgency.  Musculoskeletal: Positive for back pain. Negative for neck pain.  Neurological: Positive for weakness. Negative for dizziness, tingling, speech change, focal weakness and headaches.  Psychiatric/Behavioral: Negative for depression. The patient is not nervous/anxious.     MEDICATIONS AT HOME:   Prior to Admission medications   Medication Sig Start Date End Date Taking? Authorizing Provider  albuterol (ACCUNEB) 1.25 MG/3ML nebulizer solution Inhale 3 mLs into the lungs every 6 (six) hours as needed for wheezing.     [provider]  albuterol (PROVENTIL HFA;VENTOLIN HFA) 108 (90 Base) MCG/ACT inhaler Inhale 2 puffs into the lungs every 6 (six) hours as needed for wheezing or shortness of breath.     [provider]  alendronate (FOSAMAX) 70 MG tablet Take 70 mg by mouth once a week. 02/01/19 05/02/19  [provider]  ALPRAZolam Duanne Moron) 0.5 MG tablet Take 0.25-0.5 mg by mouth 2 (two) times a day. Take 0.25 mg in  the morning, then 0.5 mg at night    [provider]  atorvastatin (LIPITOR) 20 MG tablet Take 20 mg by mouth at bedtime.     [provider]  busPIRone (BUSPAR) 10 MG tablet Take 10 mg by mouth 2 (two) times daily.    [provider]  cholecalciferol (VITAMIN D) 1000 units tablet Take 1,000 Units by mouth daily.    [provider]  cloNIDine (CATAPRES) 0.1 MG tablet Take 0.1 mg by mouth 2 (two) times daily as needed. 02/13/19   [provider]  diclofenac sodium (VOLTAREN) 1 % GEL Apply 2 g topically 4 (four) times daily. 02/01/19   [provider]  diltiazem (DILACOR XR) 120 MG 24 hr capsule Take 120 mg by mouth daily.    [provider]  escitalopram (LEXAPRO) 5 MG tablet Take 5 mg by mouth daily.    [provider]  esomeprazole (NEXIUM) 40 MG capsule Take 40 mg by mouth 2 (two) times daily before a meal.     [provider]  ferrous sulfate 325 (65 FE) MG tablet Take 325 mg by mouth daily with breakfast.    [provider]  Fluticasone-Salmeterol (ADVAIR)  250-50 MCG/DOSE AEPB Inhale 1 puff into the lungs 2 (two) times daily.    [provider]  hydrALAZINE (APRESOLINE) 10 MG tablet Take 5 tablets (50 mg total) by mouth 3 (three) times daily. 02/21/19   Fritzi Mandes, MD  HYDROcodone-acetaminophen (NORCO/VICODIN) 5-325 MG tablet Take 1 tablet by mouth 2 (two) times daily as needed for moderate pain or severe pain.     [provider]  levothyroxine (SYNTHROID, LEVOTHROID) 50 MCG tablet Take 50 mcg by mouth daily before breakfast.    [provider]  loratadine (CLARITIN) 10 MG tablet Take 10 mg by mouth daily.    [provider]  losartan (COZAAR) 50 MG tablet Take 2 tablets (100 mg total) by mouth at bedtime. 12/13/18   Loletha Grayer, MD  magnesium oxide (MAG-OX) 400 MG tablet Take 400 mg by mouth 2 (two) times daily.    [provider]  meclizine (ANTIVERT) 25 MG  tablet Take 1 tablet (25 mg total) by mouth 2 (two) times daily as needed for dizziness. 09/16/18   Merlyn Lot, MD  meloxicam (MOBIC) 15 MG tablet Take 15 mg by mouth daily. 02/07/19   [provider]  mometasone-formoterol (DULERA) 100-5 MCG/ACT AERO Inhale 2 puffs into the lungs 2 (two) times daily.    [provider]  polyethylene glycol (MIRALAX / GLYCOLAX) 17 g packet Take 17 g by mouth daily. 01/03/19   Earleen Newport, MD      VITAL SIGNS:  Blood pressure (!) 158/73, pulse 68, temperature 98.6 F (37 C), resp. rate 20, height 5\' 2"  (1.575 m), weight 73.9 kg, SpO2 98 %.  PHYSICAL EXAMINATION:  Physical Exam  GENERAL:  85 y.o.-year-old patient lying in the bed with no acute distress.  EYES: Pupils equal, round, reactive to light and accommodation. No scleral icterus. Extraocular muscles intact.  HEENT: Head atraumatic, normocephalic. Oropharynx and nasopharynx clear.  NECK:  Supple, no jugular venous distention. No thyroid enlargement, no tenderness.  LUNGS: Normal breath sounds bilaterally, no wheezing, rales,rhonchi or crepitation. No use of accessory muscles of respiration.  CARDIOVASCULAR: Irregularly irregular rhythm, regular rate, S1, S2 normal. No murmurs, rubs, or gallops.  ABDOMEN: Soft, nontender, nondistended. Bowel sounds present. No organomegaly or mass.  EXTREMITIES: No cyanosis, or clubbing. 1+ pitting edema in the lower extremities bilaterally. NEUROLOGIC: Cranial nerves II through XII are intact. + Global weakness. Sensation intact. Gait not checked.  PSYCHIATRIC: The patient is alert and oriented x 3.  SKIN: No obvious rash, lesion, or ulcer.   LABORATORY PANEL:   CBC Recent Labs  Lab 04/08/19 1541  WBC 8.2  HGB 13.5  HCT 39.1  PLT 243   ------------------------------------------------------------------------------------------------------------------  Chemistries  Recent Labs  Lab 04/08/19 1541  NA 122*  K 4.9  CL 88*   CO2 20*  GLUCOSE 108*  BUN 13  CREATININE 0.72  CALCIUM 9.1   ------------------------------------------------------------------------------------------------------------------  Cardiac Enzymes No results for input(s): TROPONINI in the last 168 hours. ------------------------------------------------------------------------------------------------------------------  RADIOLOGY:  No results found.    IMPRESSION AND PLAN:   Hyponatremia- recurring issue for her, likely due to SIADH.  Patient had previously been fluid restricted to 40 ounces a day, but started drinking whatever she wanted about 1 month ago. She does appear mildly volume overloaded, which is likely contributing. -Will give a dose of tolvaptan -Fluid restriction to 1217ml/day -Check urine sodium and urine osmolarity  -Check AM cortisol, given her hyponatremia and borderline hyperkalemia -Will give a dose of po lasix, given that  she appears mildly volume overloaded  Paroxysmal atrial fibrillation- rate-controlled here -Continue home diltiazem -Not on any anticoagulation due to recent duodenal ulcers and epistaxis   Hypertension- BP elevated in the ED -Continue home diltiazem, hydralazine, losartan -Hydralazine IV as needed  COPD- stable, no signs of acute exacerbation.  Patient does endorse cough. -Continue home inhalers -Check CXR  Chronic back pain -Continue home pain meds  Hypothyroidism -Continue home Synthroid  Hyperlipidemia -Continue home Lipitor  Chronic anxiety- stable -Continue home xanax and buspar -Patient is on Lexapro at home- would discontinue this as it can cause hyponatremia in the elderly  All the records are reviewed and case discussed with ED provider. Management plans discussed with the patient, family and they are in agreement.  CODE STATUS: Full  TOTAL TIME TAKING CARE OF THIS PATIENT: 45 minutes.    Berna Spare Amayrany Cafaro M.D on 04/08/2019 at 6:20 PM  Between 7am to 6pm - Pager [phone]  After 6pm go to [email or web address] - Proofreader  [facility] [place] [facility]  Office  [phone]  CC: Primary care physician; [name], MD   Note: This dictation was prepared with Dragon dictation along with smaller phrase technology. Any transcriptional errors that result from this process are unintentional.

## 2019-04-08 NOTE — ED Notes (Signed)
Transport to floor room 134.AS

## 2019-04-08 NOTE — Plan of Care (Signed)

## 2019-04-08 NOTE — Plan of Care (Signed)
  Problem: Education: Goal: Knowledge of General Education information will improve Description: Including pain rating scale, medication(s)/side effects and non-pharmacologic comfort measures 04/08/2019 2310 by Cheron Every, RN Outcome: Progressing 04/08/2019 2307 by Cheron Every, RN Outcome: Progressing   Problem: Health Behavior/Discharge Planning: Goal: Ability to manage health-related needs will improve 04/08/2019 2310 by Cheron Every, RN Outcome: Progressing 04/08/2019 2307 by Cheron Every, RN Outcome: Progressing   Problem: Clinical Measurements: Goal: Ability to maintain clinical measurements within normal limits will improve 04/08/2019 2310 by Cheron Every, RN Outcome: Progressing 04/08/2019 2307 by Cheron Every, RN Outcome: Progressing Goal: Will remain free from infection 04/08/2019 2310 by Cheron Every, RN Outcome: Progressing 04/08/2019 2307 by Cheron Every, RN Outcome: Progressing Goal: Diagnostic test results will improve 04/08/2019 2310 by Cheron Every, RN Outcome: Progressing 04/08/2019 2307 by Cheron Every, RN Outcome: Progressing Goal: Respiratory complications will improve 04/08/2019 2310 by Cheron Every, RN Outcome: Progressing 04/08/2019 2307 by Cheron Every, RN Outcome: Progressing Goal: Cardiovascular complication will be avoided 04/08/2019 2310 by Cheron Every, RN Outcome: Progressing 04/08/2019 2307 by Cheron Every, RN Outcome: Progressing   Problem: Activity: Goal: Risk for activity intolerance will decrease 04/08/2019 2310 by Cheron Every, RN Outcome: Progressing 04/08/2019 2307 by Cheron Every, RN Outcome: Progressing   Problem: Nutrition: Goal: Adequate nutrition will be maintained 04/08/2019 2310 by Cheron Every, RN Outcome: Progressing 04/08/2019 2307 by Cheron Every, RN Outcome: Progressing   Problem: Coping: Goal: Level  of anxiety will decrease 04/08/2019 2310 by Cheron Every, RN Outcome: Progressing 04/08/2019 2307 by Cheron Every, RN Outcome: Progressing   Problem: Elimination: Goal: Will not experience complications related to bowel motility 04/08/2019 2310 by Cheron Every, RN Outcome: Progressing 04/08/2019 2307 by Cheron Every, RN Outcome: Progressing Goal: Will not experience complications related to urinary retention 04/08/2019 2310 by Cheron Every, RN Outcome: Progressing 04/08/2019 2307 by Cheron Every, RN Outcome: Progressing   Problem: Pain Managment: Goal: General experience of comfort will improve 04/08/2019 2310 by Cheron Every, RN Outcome: Progressing 04/08/2019 2307 by Cheron Every, RN Outcome: Progressing   Problem: Safety: Goal: Ability to remain free from injury will improve 04/08/2019 2310 by Cheron Every, RN Outcome: Progressing 04/08/2019 2307 by Cheron Every, RN Outcome: Progressing   Problem: Skin Integrity: Goal: Risk for impaired skin integrity will decrease 04/08/2019 2310 by Cheron Every, RN Outcome: Progressing 04/08/2019 2307 by Cheron Every, RN Outcome: Progressing

## 2019-04-09 DIAGNOSIS — E871 Hypo-osmolality and hyponatremia: Secondary | ICD-10-CM | POA: Diagnosis not present

## 2019-04-09 LAB — OSMOLALITY, URINE: Osmolality, Ur: 143 mOsm/kg — ABNORMAL LOW (ref 300–900)

## 2019-04-09 LAB — BASIC METABOLIC PANEL
Anion gap: 9 (ref 5–15)
BUN: 12 mg/dL (ref 8–23)
CO2: 28 mmol/L (ref 22–32)
Calcium: 9.5 mg/dL (ref 8.9–10.3)
Chloride: 96 mmol/L — ABNORMAL LOW (ref 98–111)
Creatinine, Ser: 0.87 mg/dL (ref 0.44–1.00)
GFR calc Af Amer: >60 mL/min (ref >60)
GFR calc non Af Amer: >60 mL/min (ref >60)
Glucose, Bld: 110 mg/dL — ABNORMAL HIGH (ref 70–99)
Potassium: 4.4 mmol/L (ref 3.5–5.1)
Sodium: 133 mmol/L — ABNORMAL LOW (ref 135–145)

## 2019-04-09 LAB — CBC
HCT: 39.3 % (ref 36.0–46.0)
Hemoglobin: 13.4 g/dL (ref 12.0–15.0)
MCH: 30 pg (ref 26.0–34.0)
MCHC: 34.1 g/dL (ref 30.0–36.0)
MCV: 88.1 fL (ref 80.0–100.0)
Platelets: 200 10*3/uL (ref 150–400)
RBC: 4.46 MIL/uL (ref 3.87–5.11)
RDW: 12.7 % (ref 11.5–15.5)
WBC: 6.6 10*3/uL (ref 4.0–10.5)
nRBC: 0 % (ref 0.0–0.2)

## 2019-04-09 LAB — SODIUM, URINE, RANDOM: Sodium, Ur: 52 mmol/L

## 2019-04-09 LAB — SARS CORONAVIRUS 2 (TAT 6-24 HRS): SARS Coronavirus 2: NEGATIVE

## 2019-04-09 NOTE — Care Management Obs Status (Signed)
[place] NOTIFICATION   Patient Details  Name: Janice Perkins MRN: [id number] Date of Birth: July 29, 1933   Medicare Observation Status Notification Given:  Yes    Shaneque Merkle A Jaythan Hinely, RN 04/09/2019, 12:33 PM

## 2019-04-09 NOTE — Progress Notes (Signed)
Patient discharging home, instructions given to patient, verbalized understanding.

## 2019-04-09 NOTE — Discharge Instructions (Signed)
Fluid restriction 

## 2019-04-09 NOTE — Discharge Summary (Signed)
[facility] NAME: Janice Perkins    MR#:  [id number]  DATE OF BIRTH:  1932/09/16  DATE OF ADMISSION:  04/08/2019   ADMITTING PHYSICIAN: Sela Hua, MD  DATE OF DISCHARGE: 04/09/2019 12:30 PM  PRIMARY CARE PHYSICIAN: [name], MD   ADMISSION DIAGNOSIS:  Hyponatremia [E87.1] DISCHARGE DIAGNOSIS:  Active Problems:   Hyponatremia  SECONDARY DIAGNOSIS:   Past Medical History:  Diagnosis Date  . Asthma   . Atrial fibrillation ([place])   . Breast cancer ([place]) 2011  . Bronchitis 04/2015  . Cancer [facility]) 2011   breast  . COPD (chronic obstructive pulmonary disease) ([place])   . Hypertension   . Shingles    10/2015   HOSPITAL COURSE:  Hyponatremia- recurring issue for her, likely due to SIADH.  Patient had previously been fluid restricted to 40 ounces a day, but started drinking whatever she wanted about 1 month ago. She does appear mildly volume overloaded, which is likely contributing. She was given a dose of tolvaptan and po lasix, given that she appears mildly volume overloaded.  She is advised to fluid restriction.  Sodium improved from 122 to 133 today.  The patient feels much better.  Paroxysmal atrial fibrillation- rate-controlled here -Continue home diltiazem -Not on any anticoagulation due to recent duodenal ulcers and epistaxis   Hypertension- BP elevated in the ED -Continue home diltiazem, hydralazine, losartan -Hydralazine IV as needed  COPD- stable, no signs of acute exacerbation.  Patient does endorse cough. -Continue home inhalers -Check CXR report chronic bronchitis changes.  Chronic back pain -Continue home pain meds  Hypothyroidism -Continue home Synthroid  Hyperlipidemia -Continue home Lipitor  Chronic anxiety- stable -Continue home xanax and buspar -Patient is on Lexapro at home- would discontinue this as it can cause hyponatremia in the elderly  DISCHARGE CONDITIONS:  Stable,  discharge to home today. CONSULTS OBTAINED:   DRUG ALLERGIES:   Allergies  Allergen Reactions  . Diphenhydramine Hcl Rash  . Penicillins Hives    .Has patient had a PCN reaction causing immediate rash, facial/tongue/throat swelling, SOB or lightheadedness with hypotension: Unknown Has patient had a PCN reaction causing severe rash involving mucus membranes or skin necrosis: Unknown Has patient had a PCN reaction that required hospitalization: Unknown Has patient had a PCN reaction occurring within the last 10 years: Unknown If all of the above answers are "NO", then may proceed with Cephalosporin use.   . Captopril Other (See Comments)  . Eliquis [Apixaban] Other (See Comments)    Stomach bleed  . Enalapril Maleate Other (See Comments)    Other reaction(s): Unknown  . Tape Itching  . Terfenadine Other (See Comments)  . Augmentin [Amoxicillin-Pot Clavulanate] Rash    Has patient had a PCN reaction causing immediate rash, facial/tongue/throat swelling, SOB or lightheadedness with hypotension: Unknown Has patient had a PCN reaction causing severe rash involving mucus membranes or skin necrosis: Unknown Has patient had a PCN reaction that required hospitalization: Unknown Has patient had a PCN reaction occurring within the last 10 years: Unknown If all of the above answers are "NO", then may proceed with Cephalosporin use.  . Biaxin [Clarithromycin] Rash, Other (See Comments) and Hives    Other reaction(s): Unknown   DISCHARGE MEDICATIONS:   Allergies as of 04/09/2019      Reactions   Diphenhydramine Hcl Rash   Penicillins Hives   .Has patient had a PCN reaction causing immediate rash, facial/tongue/throat swelling, SOB or lightheadedness with hypotension:  Unknown Has patient had a PCN reaction causing severe rash involving mucus membranes or skin necrosis: Unknown Has patient had a PCN reaction that required hospitalization: Unknown Has patient had a PCN reaction occurring within  the last 10 years: Unknown If all of the above answers are "NO", then may proceed with Cephalosporin use.   Captopril Other (See Comments)   Eliquis [apixaban] Other (See Comments)   Stomach bleed   Enalapril Maleate Other (See Comments)   Other reaction(s): Unknown   Tape Itching   Terfenadine Other (See Comments)   Augmentin [amoxicillin-pot Clavulanate] Rash   Has patient had a PCN reaction causing immediate rash, facial/tongue/throat swelling, SOB or lightheadedness with hypotension: Unknown Has patient had a PCN reaction causing severe rash involving mucus membranes or skin necrosis: Unknown Has patient had a PCN reaction that required hospitalization: Unknown Has patient had a PCN reaction occurring within the last 10 years: Unknown If all of the above answers are "NO", then may proceed with Cephalosporin use.   Biaxin [clarithromycin] Rash, Other (See Comments), Hives   Other reaction(s): Unknown      Medication List    TAKE these medications   albuterol 108 (90 Base) MCG/ACT inhaler Commonly known as: VENTOLIN HFA Inhale 2 puffs into the lungs every 6 (six) hours as needed for wheezing or shortness of breath.   albuterol 1.25 MG/3ML nebulizer solution Commonly known as: ACCUNEB Inhale 3 mLs into the lungs every 6 (six) hours as needed for wheezing.   alendronate 70 MG tablet Commonly known as: FOSAMAX Take 70 mg by mouth once a week.   ALPRAZolam 0.5 MG tablet Commonly known as: XANAX Take 0.25-0.5 mg by mouth 2 (two) times a day. Take 0.25 mg in the morning, then 0.5 mg at night   atorvastatin 40 MG tablet Commonly known as: LIPITOR Take 40 mg by mouth daily.   busPIRone 10 MG tablet Commonly known as: BUSPAR Take 10 mg by mouth 2 (two) times daily.   cholecalciferol 1000 units tablet Commonly known as: VITAMIN D Take 1,000 Units by mouth daily.   cloNIDine 0.1 MG tablet Commonly known as: CATAPRES Take 0.1 mg by mouth 2 (two) times daily as needed.    diclofenac sodium 1 % Gel Commonly known as: VOLTAREN Apply 2 g topically 4 (four) times daily.   diltiazem 120 MG 24 hr capsule Commonly known as: DILACOR XR Take 120 mg by mouth daily.   escitalopram 5 MG tablet Commonly known as: LEXAPRO Take 5 mg by mouth daily.   esomeprazole 40 MG capsule Commonly known as: NEXIUM Take 40 mg by mouth 2 (two) times daily before a meal.   ferrous sulfate 325 (65 FE) MG tablet Take 325 mg by mouth daily with breakfast.   Fluticasone-Salmeterol 250-50 MCG/DOSE Aepb Commonly known as: ADVAIR Inhale 1 puff into the lungs 2 (two) times daily.   hydrALAZINE 10 MG tablet Commonly known as: APRESOLINE Take 5 tablets (50 mg total) by mouth 3 (three) times daily.   levothyroxine 50 MCG tablet Commonly known as: SYNTHROID Take 50 mcg by mouth daily before breakfast.   loratadine 10 MG tablet Commonly known as: CLARITIN Take 10 mg by mouth daily.   losartan 50 MG tablet Commonly known as: COZAAR Take 2 tablets (100 mg total) by mouth at bedtime.   magnesium oxide 400 MG tablet Commonly known as: MAG-OX Take 400 mg by mouth 2 (two) times daily.   meclizine 25 MG tablet Commonly known as: ANTIVERT Take 1 tablet (  25 mg total) by mouth 2 (two) times daily as needed for dizziness.   meloxicam 15 MG tablet Commonly known as: MOBIC Take 15 mg by mouth daily.   mometasone-formoterol 100-5 MCG/ACT Aero Commonly known as: DULERA Inhale 2 puffs into the lungs 2 (two) times daily.   oxyCODONE-acetaminophen 5-325 MG tablet Commonly known as: PERCOCET/ROXICET Take 1 tablet by mouth daily.   polyethylene glycol 17 g packet Commonly known as: MIRALAX / GLYCOLAX Take 17 g by mouth daily.   RABEprazole 20 MG tablet Commonly known as: ACIPHEX Take 20 mg by mouth 2 (two) times daily.        DISCHARGE INSTRUCTIONS:  See AVS. If you experience worsening of your admission symptoms, develop shortness of breath, life threatening emergency,  suicidal or homicidal thoughts you must seek medical attention immediately by calling 911 or calling your MD immediately  if symptoms less severe.  You Must read complete instructions/literature along with all the possible adverse reactions/side effects for all the Medicines you take and that have been prescribed to you. Take any new Medicines after you have completely understood and accpet all the possible adverse reactions/side effects.   Please note  You were cared for by a hospitalist during your hospital stay. If you have any questions about your discharge medications or the care you received while you were in the hospital after you are discharged, you can call the unit and asked to speak with the hospitalist on call if the hospitalist that took care of you is not available. Once you are discharged, your primary care physician will handle any further medical issues. Please note that NO REFILLS for any discharge medications will be authorized once you are discharged, as it is imperative that you return to your primary care physician (or establish a relationship with a primary care physician if you do not have one) for your aftercare needs so that they can reassess your need for medications and monitor your lab values.    On the day of Discharge:  VITAL SIGNS:  Blood pressure (!) 145/63, pulse (!) 49, temperature 98 F (36.7 C), temperature source Oral, resp. rate 16, height 5\' 2"  ([id number] m), weight 72.3 kg, SpO2 97 %. PHYSICAL EXAMINATION:  GENERAL:  85 y.o.-year-old patient lying in the bed with no acute distress.  EYES: Pupils equal, round, reactive to light and accommodation. No scleral icterus. Extraocular muscles intact.  HEENT: Head atraumatic, normocephalic. Oropharynx and nasopharynx clear.  NECK:  Supple, no jugular venous distention. No thyroid enlargement, no tenderness.  LUNGS: Normal breath sounds bilaterally, no wheezing, rales,rhonchi or crepitation. No use of accessory muscles  of respiration.  CARDIOVASCULAR: S1, S2 normal. No murmurs, rubs, or gallops.  ABDOMEN: Soft, non-tender, non-distended. Bowel sounds present. No organomegaly or mass.  EXTREMITIES: No pedal edema, cyanosis, or clubbing.  NEUROLOGIC: Cranial nerves II through XII are intact. Muscle strength 5/5 in all extremities. Sensation intact. Gait not checked.  PSYCHIATRIC: The patient is alert and oriented x 3.  SKIN: No obvious rash, lesion, or ulcer.  DATA REVIEW:   CBC Recent Labs  Lab 04/09/19 0455  WBC 6.6  HGB 13.4  HCT 39.3  PLT 200    Chemistries  Recent Labs  Lab 04/09/19 0455  NA 133*  K 4.4  CL 96*  CO2 28  GLUCOSE 110*  BUN 12  CREATININE 0.87  CALCIUM 9.5     Microbiology Results  Results for orders placed or performed during the hospital encounter of 04/08/19  SARS  CORONAVIRUS 2 Nasal Swab Aptima Multi Swab     Status: None   Collection Time: 04/08/19  7:07 PM   Specimen: Aptima Multi Swab; Nasal Swab  Result Value Ref Range Status   SARS Coronavirus 2 NEGATIVE NEGATIVE Final    Comment: (NOTE) SARS-CoV-2 target nucleic acids are NOT DETECTED. The SARS-CoV-2 RNA is generally detectable in upper and lower respiratory specimens during the acute phase of infection. Negative results do not preclude SARS-CoV-2 infection, do not rule out co-infections with other pathogens, and should not be used as the sole basis for treatment or other patient management decisions. Negative results must be combined with clinical observations, patient history, and epidemiological information. The expected result is Negative. Fact Sheet for Patients: SugarRoll.be Fact Sheet for Healthcare Providers: https://www.woods-mathews.com/ This test is not yet approved or cleared by the [place] FDA and  has been authorized for detection and/or diagnosis of SARS-CoV-2 by FDA under an Emergency Use Authorization (EUA). This EUA will remain  in  effect (meaning this test can be used) for the duration of the COVID-19 declaration under Section 56 4(b)(1) of the Act, 21 U.S.C. section 360bbb-3(b)(1), unless the authorization is terminated or revoked sooner. Performed at [facility], [place] [address], [place], [place] [address]     RADIOLOGY:  Dg Chest 1 View  Result Date: 04/08/2019 CLINICAL DATA:  Cough EXAM: CHEST  1 VIEW COMPARISON:  12/23/2018 FINDINGS: Cardiomegaly. Peribronchial thickening and interstitial prominence is similar to prior study. No confluent opacities or effusions. No acute bony abnormality. IMPRESSION: Mild cardiomegaly. Chronic bronchitic changes, stable. Electronically Signed   By: Rolm Baptise M.D.   On: 04/08/2019 22:09     Management plans discussed with the patient, her husband and they are in agreement.  CODE STATUS: Full Code   TOTAL TIME TAKING CARE OF THIS PATIENT: 26 minutes.    Demetrios Loll M.D on 04/09/2019 at 11:52 AM  Between 7am to 6pm - Pager - [phone]  After 6pm go to [email or web address] - Proofreader  [facility]  Office  [phone]  CC: Primary care physician; [name], MD   Note: This dictation was prepared with Dragon dictation along with smaller phrase technology. Any transcriptional errors that result from this process are unintentional.

## 2019-04-09 NOTE — Progress Notes (Signed)
BP 145/63, HR 49, Dr Bridgett Larsson notified, MD states to give hydralazine and to hold diltiazem.

## 2019-04-09 NOTE — Care Management CC44 (Signed)
Condition Code 44 Documentation Completed  Patient Details  Name: Janice Perkins MRN: [id number] Date of Birth: 01-01-1933   Condition Code 44 given:  Yes Patient signature on Condition Code 44 notice:  Yes Documentation of 2 MD's agreement:  Yes Code 44 added to claim:  Yes    Latanya Maudlin, RN 04/09/2019, 12:33 PM

## 2019-04-09 NOTE — Plan of Care (Signed)

## 2019-04-14 ENCOUNTER — Other Ambulatory Visit: Payer: Self-pay

## 2019-04-14 ENCOUNTER — Emergency Department: Payer: Medicare Other

## 2019-04-14 ENCOUNTER — Emergency Department
Admission: EM | Admit: 2019-04-14 | Discharge: 2019-04-14 | Disposition: A | Discharge status: Home / Self Care | Payer: Medicare Other | Attending: Emergency Medicine | Admitting: Emergency Medicine

## 2019-04-14 DIAGNOSIS — Z91048 Other nonmedicinal substance allergy status: Secondary | ICD-10-CM | POA: Insufficient documentation

## 2019-04-14 DIAGNOSIS — I480 Paroxysmal atrial fibrillation: Secondary | ICD-10-CM | POA: Insufficient documentation

## 2019-04-14 DIAGNOSIS — Z79899 Other long term (current) drug therapy: Secondary | ICD-10-CM | POA: Diagnosis not present

## 2019-04-14 DIAGNOSIS — Z881 Allergy status to other antibiotic agents status: Secondary | ICD-10-CM | POA: Diagnosis not present

## 2019-04-14 DIAGNOSIS — Z880 Allergy status to penicillin: Secondary | ICD-10-CM | POA: Insufficient documentation

## 2019-04-14 DIAGNOSIS — Z888 Allergy status to other drugs, medicaments and biological substances status: Secondary | ICD-10-CM | POA: Insufficient documentation

## 2019-04-14 DIAGNOSIS — R531 Weakness: Secondary | ICD-10-CM

## 2019-04-14 DIAGNOSIS — I10 Essential (primary) hypertension: Secondary | ICD-10-CM | POA: Insufficient documentation

## 2019-04-14 DIAGNOSIS — E860 Dehydration: Secondary | ICD-10-CM | POA: Diagnosis not present

## 2019-04-14 DIAGNOSIS — J449 Chronic obstructive pulmonary disease, unspecified: Secondary | ICD-10-CM | POA: Diagnosis not present

## 2019-04-14 LAB — URINALYSIS, COMPLETE (UACMP) WITH MICROSCOPIC
Bacteria, UA: NONE SEEN
Bilirubin Urine: NEGATIVE
Glucose, UA: NEGATIVE mg/dL
Hgb urine dipstick: NEGATIVE
Ketones, ur: NEGATIVE mg/dL
Leukocytes,Ua: NEGATIVE
Nitrite: NEGATIVE
Protein, ur: NEGATIVE mg/dL
Specific Gravity, Urine: 1.008 (ref 1.005–1.030)
pH: 6 (ref 5.0–8.0)

## 2019-04-14 LAB — BASIC METABOLIC PANEL
Anion gap: 12 (ref 5–15)
BUN: 17 mg/dL (ref 8–23)
CO2: 24 mmol/L (ref 22–32)
Calcium: 10.1 mg/dL (ref 8.9–10.3)
Chloride: 99 mmol/L (ref 98–111)
Creatinine, Ser: 1.07 mg/dL — ABNORMAL HIGH (ref 0.44–1.00)
GFR calc Af Amer: 55 mL/min — ABNORMAL LOW (ref >60)
GFR calc non Af Amer: 48 mL/min — ABNORMAL LOW (ref >60)
Glucose, Bld: 115 mg/dL — ABNORMAL HIGH (ref 70–99)
Potassium: 4.2 mmol/L (ref 3.5–5.1)
Sodium: 135 mmol/L (ref 135–145)

## 2019-04-14 LAB — CBC
HCT: 47.6 % — ABNORMAL HIGH (ref 36.0–46.0)
Hemoglobin: 15.9 g/dL — ABNORMAL HIGH (ref 12.0–15.0)
MCH: 29.8 pg (ref 26.0–34.0)
MCHC: 33.4 g/dL (ref 30.0–36.0)
MCV: 89.3 fL (ref 80.0–100.0)
Platelets: 270 10*3/uL (ref 150–400)
RBC: 5.33 MIL/uL — ABNORMAL HIGH (ref 3.87–5.11)
RDW: 13 % (ref 11.5–15.5)
WBC: 10.7 10*3/uL — ABNORMAL HIGH (ref 4.0–10.5)
nRBC: 0 % (ref 0.0–0.2)

## 2019-04-14 LAB — HEPATIC FUNCTION PANEL
ALT: 21 U/L (ref 0–44)
AST: 24 U/L (ref 15–41)
Albumin: 4.8 g/dL (ref 3.5–5.0)
Alkaline Phosphatase: 59 U/L (ref 38–126)
Bilirubin, Direct: 0.1 mg/dL (ref 0.0–0.2)
Indirect Bilirubin: 0.7 mg/dL (ref 0.3–0.9)
Total Bilirubin: 0.8 mg/dL (ref 0.3–1.2)
Total Protein: 8.3 g/dL — ABNORMAL HIGH (ref 6.5–8.1)

## 2019-04-14 LAB — LIPASE, BLOOD: Lipase: 31 U/L (ref 11–51)

## 2019-04-14 LAB — MAGNESIUM: Magnesium: 2.1 mg/dL (ref 1.7–2.4)

## 2019-04-14 MED ORDER — OXYCODONE-ACETAMINOPHEN 5-325 MG PO TABS
1.0000 | ORAL_TABLET | Freq: Once | ORAL | Status: AC
  Administered 2019-04-14: 1 via ORAL
  Filled 2019-04-14: qty 1

## 2019-04-14 MED ORDER — SODIUM CHLORIDE 0.9 % IV SOLN
Freq: Once | INTRAVENOUS | Status: AC
  Administered 2019-04-14: 15:00:00 via INTRAVENOUS

## 2019-04-14 MED ORDER — CLONIDINE HCL 0.1 MG PO TABS
0.1000 mg | ORAL_TABLET | Freq: Once | ORAL | Status: AC
  Administered 2019-04-14: 0.1 mg via ORAL
  Filled 2019-04-14: qty 1

## 2019-04-14 MED ORDER — SODIUM CHLORIDE 0.9% FLUSH: 3.0000 mL | Freq: Once | INTRAVENOUS | Status: DC

## 2019-04-14 NOTE — ED Notes (Signed)
Pt approached this RN when this RN called another patient up. Pt states anger regarding other patients being called and going back before her. This RN explained to patient that just because other people were going back didn't mean they were going to the same area of the ER, pt states "no but I've been sick since 3 o'clock this morning and y'all don't understanding Dr. Ubaldo Glassing sent me and I just don't understand, I've been here since before all these people". This RN apologized and attempted to explain delay to patient, pt ambulatory without difficulty back to her seat in the lobby.

## 2019-04-14 NOTE — ED Notes (Signed)
Paper copy of discharge, topaz not working

## 2019-04-14 NOTE — ED Notes (Signed)
Graham crackers and water provided ?

## 2019-04-14 NOTE — ED Provider Notes (Signed)
Work-up appears to be unremarkable and she has received some IV fluids here.  She is cleared for close outpatient follow-up with her doctor.   Earleen Newport, MD 04/14/19 [phone]

## 2019-04-14 NOTE — ED Provider Notes (Signed)
[facility] Emergency Department Provider Note  ____________________________________________   First MD Initiated Contact with Patient 04/14/19 1302     (approximate)  I have reviewed the triage vital signs and the nursing notes.   HISTORY  Chief Complaint Emesis and Weakness    HPI Janice Perkins is a 85 y.o. female with fairly extensive past medical history including hypertension, chronic pain, atrial fibrillation, here with generalized weakness.  The patient states that since her recent discharge following overnight stay for hyponatremia, she has felt generalized weakness.  She states that she has had difficulty managing her fluid status and has been intermittently restricting her fluid intake due to edema.  She states that over the last several days, she has had generalized weakness which she describes as just not having much energy.  She has felt some mild, intermittent nausea as well.  She also states that she has pain "all over" in her shoulders, hips, legs, and abdomen.  This is her chronic pain, but is moderately worse than usual.  She has been taking oxycodone for this.  She also recently had a steroid injection for disc disease in her back.  She states that since then, her blood pressure has been somewhat elevated.  She tried to call her doctor to discuss the symptoms and was told to come to the ER for further evaluation.  She states that the last time she felt like this, her sodium was low.  Denies any fevers or chills.  No urinary symptoms.        Past Medical History:  Diagnosis Date  . Asthma   . Atrial fibrillation ([place])   . Breast cancer ([place]) 2011  . Bronchitis 04/2015  . Cancer [facility]) 2011   breast  . COPD (chronic obstructive pulmonary disease) ([place])   . Hypertension   . Shingles    10/2015    Patient Active Problem List   Diagnosis Date Noted  . Duodenum ulcer   . Melena   . GI bleed 02/17/2019  . Hypertensive encephalopathy  12/11/2018  . Hypoxia 05/30/2018  . Leukocytosis 04/05/2018  . Pneumonia 11/15/2017  . Pleural effusion 05/16/2017  . AF (paroxysmal atrial fibrillation) ([place]) 05/16/2017  . Breast cancer ([place]) 05/16/2017  . Dependence on nocturnal oxygen therapy 05/16/2017  . HTN (hypertension) 05/16/2017  . Generalized weakness 03/07/2017  . Hyponatremia 03/07/2017  . Dehydration 03/07/2017  . UTI (urinary tract infection) 03/07/2017  . HCAP (healthcare-associated pneumonia) 01/19/2017  . AKI (acute kidney injury) ([place]) 12/18/2016  . Primary cancer of upper outer quadrant of right female breast ([place]) 04/07/2016  . Lumbar radiculopathy 03/23/2016  . Spinal stenosis, lumbar region, with neurogenic claudication 03/23/2016  . DDD (degenerative disc disease), lumbar 01/14/2015  . Lumbosacral facet joint syndrome 01/14/2015  . Sacroiliac joint dysfunction 01/14/2015  . Greater trochanteric bursitis 01/14/2015  . Bilateral occipital neuralgia 01/14/2015    Past Surgical History:  Procedure Laterality Date  . BREAST EXCISIONAL BIOPSY Right 11/29/13   two areas FAT NECROSIS  . BREAST EXCISIONAL BIOPSY Right 10/30/2009   lumpectomy - radiation  . COLONOSCOPY WITH PROPOFOL N/A 06/10/2018   Procedure: COLONOSCOPY WITH PROPOFOL;  Surgeon: Manya Silvas, MD;  Location: [facility] ENDOSCOPY;  Service: Endoscopy;  Laterality: N/A;  . COLONOSCOPY WITH PROPOFOL N/A 02/20/2019   Procedure: COLONOSCOPY WITH PROPOFOL;  Surgeon: Lucilla Lame, MD;  Location: [facility] ENDOSCOPY;  Service: Endoscopy;  Laterality: N/A;  . ESOPHAGOGASTRODUODENOSCOPY (EGD) WITH PROPOFOL N/A 06/10/2018   Procedure: ESOPHAGOGASTRODUODENOSCOPY (EGD) WITH  PROPOFOL;  Surgeon: Manya Silvas, MD;  Location: [facility] ENDOSCOPY;  Service: Endoscopy;  Laterality: N/A;  . ESOPHAGOGASTRODUODENOSCOPY (EGD) WITH PROPOFOL N/A 02/20/2019   Procedure: ESOPHAGOGASTRODUODENOSCOPY (EGD) WITH PROPOFOL;  Surgeon: Lucilla Lame, MD;  Location: [facility] ENDOSCOPY;  Service:  Endoscopy;  Laterality: N/A;  . NASAL SINUS SURGERY      Prior to Admission medications   Medication Sig Start Date End Date Taking? Authorizing Provider  albuterol (ACCUNEB) 1.25 MG/3ML nebulizer solution Inhale 3 mLs into the lungs every 6 (six) hours as needed for wheezing.     [provider]  albuterol (PROVENTIL HFA;VENTOLIN HFA) 108 (90 Base) MCG/ACT inhaler Inhale 2 puffs into the lungs every 6 (six) hours as needed for wheezing or shortness of breath.     [provider]  alendronate (FOSAMAX) 70 MG tablet Take 70 mg by mouth once a week. 02/01/19 05/02/19  [provider]  ALPRAZolam Duanne Moron) 0.5 MG tablet Take 0.25-0.5 mg by mouth 2 (two) times a day. Take 0.25 mg in the morning, then 0.5 mg at night    [provider]  atorvastatin (LIPITOR) 40 MG tablet Take 40 mg by mouth daily. 03/17/19   [provider]  busPIRone (BUSPAR) 10 MG tablet Take 10 mg by mouth 2 (two) times daily.    [provider]  cholecalciferol (VITAMIN D) 1000 units tablet Take 1,000 Units by mouth daily.    [provider]  cloNIDine (CATAPRES) 0.1 MG tablet Take 0.1 mg by mouth 2 (two) times daily as needed. 02/13/19   [provider]  diclofenac sodium (VOLTAREN) 1 % GEL Apply 2 g topically 4 (four) times daily. 02/01/19   [provider]  diltiazem (DILACOR XR) 120 MG 24 hr capsule Take 120 mg by mouth daily.    [provider]  escitalopram (LEXAPRO) 5 MG tablet Take 5 mg by mouth daily.    [provider]  esomeprazole (NEXIUM) 40 MG capsule Take 40 mg by mouth 2 (two) times daily before a meal.     [provider]  ferrous sulfate 325 (65 FE) MG tablet Take 325 mg by mouth daily with breakfast.    [provider]  Fluticasone-Salmeterol (ADVAIR) 250-50 MCG/DOSE AEPB Inhale 1 puff into the lungs 2 (two) times daily.    [provider]  hydrALAZINE (APRESOLINE) 10 MG tablet Take 5 tablets  (50 mg total) by mouth 3 (three) times daily. 02/21/19   Fritzi Mandes, MD  levothyroxine (SYNTHROID, LEVOTHROID) 50 MCG tablet Take 50 mcg by mouth daily before breakfast.    [provider]  loratadine (CLARITIN) 10 MG tablet Take 10 mg by mouth daily.    [provider]  losartan (COZAAR) 50 MG tablet Take 2 tablets (100 mg total) by mouth at bedtime. 12/13/18   Loletha Grayer, MD  magnesium oxide (MAG-OX) 400 MG tablet Take 400 mg by mouth 2 (two) times daily.    [provider]  meclizine (ANTIVERT) 25 MG tablet Take 1 tablet (25 mg total) by mouth 2 (two) times daily as needed for dizziness. 09/16/18   Merlyn Lot, MD  meloxicam (MOBIC) 15 MG tablet Take 15 mg by mouth daily. 02/07/19   [provider]  mometasone-formoterol (DULERA) 100-5 MCG/ACT AERO Inhale 2 puffs into the lungs 2 (two) times daily.    [provider]  oxyCODONE-acetaminophen (PERCOCET/ROXICET) 5-325 MG tablet Take 1 tablet by mouth daily. 04/07/19 05/07/19  [provider]  polyethylene glycol (MIRALAX / GLYCOLAX) 17  g packet Take 17 g by mouth daily. 01/03/19   Earleen Newport, MD  RABEprazole (ACIPHEX) 20 MG tablet Take 20 mg by mouth 2 (two) times daily. 03/23/19   [provider]    Allergies Diphenhydramine hcl, Penicillins, Captopril, Eliquis [apixaban], Enalapril maleate, Tape, Terfenadine, Augmentin [amoxicillin-pot clavulanate], and Biaxin [clarithromycin]  Family History  Problem Relation Age of Onset  . Hypertension Mother   . Heart disease Mother   . Cancer Mother   . Stroke Father   . Hypertension Father   . Breast cancer Sister     Social History Social History   Tobacco Use  . Smoking status: Never Smoker  . Smokeless tobacco: Never Used  Substance Use Topics  . Alcohol use: No    Alcohol/week: 0.0 standard drinks  . Drug use: No    Review of Systems  Review of Systems  Constitutional: Positive for fatigue. Negative for  fever.  HENT: Negative for congestion and sore throat.   Eyes: Negative for visual disturbance.  Respiratory: Positive for cough and shortness of breath.   Cardiovascular: Negative for chest pain.  Gastrointestinal: Positive for nausea. Negative for abdominal pain, diarrhea and vomiting.  Genitourinary: Negative for flank pain.  Musculoskeletal: Positive for arthralgias and myalgias. Negative for back pain and neck pain.  Skin: Negative for rash and wound.  Neurological: Positive for weakness.  All other systems reviewed and are negative.    ____________________________________________  PHYSICAL EXAM:      VITAL SIGNS: ED Triage Vitals  Enc Vitals Group     BP 04/14/19 0949 (!) 159/101     Pulse Rate 04/14/19 0949 84     Resp 04/14/19 0949 17     Temp 04/14/19 0949 98.1 F (36.7 C)     Temp Source 04/14/19 0949 Oral     SpO2 04/14/19 0949 98 %     Weight 04/14/19 0950 159 lb 4.8 oz (72.3 kg)     Height 04/14/19 0950 5\' 2"  (1.575 m)     Head Circumference --      Peak Flow --      Pain Score 04/14/19 0950 8     Pain Loc --      Pain Edu? --      Excl. in [place]? --      Physical Exam Vitals signs and nursing note reviewed.  Constitutional:      General: She is not in acute distress.    Appearance: She is well-developed.  HENT:     Head: Normocephalic and atraumatic.     Mouth/Throat:     Mouth: Mucous membranes are dry.     Comments: Moderately dry mucous membranes Eyes:     Conjunctiva/sclera: Conjunctivae normal.  Neck:     Musculoskeletal: Neck supple.  Cardiovascular:     Rate and Rhythm: Normal rate and regular rhythm.     Heart sounds: Normal heart sounds. No murmur. No friction rub.  Pulmonary:     Effort: Pulmonary effort is normal. No respiratory distress.     Breath sounds: Normal breath sounds. No wheezing or rales.  Abdominal:     General: There is no distension.     Palpations: Abdomen is soft.     Tenderness: There is no abdominal tenderness.   Musculoskeletal:     Comments: Mild diffuse musculoskeletal tenderness, with no specific joint tenderness, warmth, or swelling  Skin:    General: Skin is warm.     Capillary Refill: Capillary refill takes less than  2 seconds.  Neurological:     Mental Status: She is alert and oriented to person, place, and time.     Motor: No abnormal muscle tone.       ____________________________________________   LABS (all labs ordered are listed, but only abnormal results are displayed)  Labs Reviewed  BASIC METABOLIC PANEL - Abnormal; Notable for the following components:      Result Value   Glucose, Bld 115 (*)    Creatinine, Ser 1.07 (*)    GFR calc non Af Amer 48 (*)    GFR calc Af Amer 55 (*)    All other components within normal limits  CBC - Abnormal; Notable for the following components:   WBC 10.7 (*)    RBC 5.33 (*)    Hemoglobin 15.9 (*)    HCT 47.6 (*)    All other components within normal limits  URINALYSIS, COMPLETE (UACMP) WITH MICROSCOPIC - Abnormal; Notable for the following components:   Color, Urine YELLOW (*)    APPearance CLEAR (*)    All other components within normal limits  MAGNESIUM  LIPASE, BLOOD  HEPATIC FUNCTION PANEL  COMPREHENSIVE METABOLIC PANEL  BILIRUBIN, DIRECT  LIPASE, BLOOD  MAGNESIUM    ____________________________________________  EKG: Atrial fibrillation, ventricular rate 70.  T wave inversion noted in lateral leads.  When compared to prior, no overt ST or T-segment changes or signs of acute ischemia or infarct. ________________________________________  RADIOLOGY All imaging, including plain films, CT scans, and ultrasounds, independently reviewed by me, and interpretations confirmed via formal radiology reads.  ED MD interpretation:   Chest x-ray is pending  Official radiology report(s): Dg Chest 2 View  Result Date: 04/14/2019 CLINICAL DATA:  Weakness and hyponatremia. EXAM: CHEST - 2 VIEW COMPARISON:  Single-view of the chest  04/08/2019. FINDINGS: Lungs are clear. There is cardiomegaly. No pneumothorax or pleural effusion. Aortic atherosclerosis noted. No acute or focal bony abnormality. The patient is status post left shoulder replacement. IMPRESSION: No acute disease. Cardiomegaly. Atherosclerosis. Electronically Signed   By: Inge Rise M.D.   On: 04/14/2019 15:55    ____________________________________________  PROCEDURES   Procedure(s) performed (including Critical Care):  Procedures  ____________________________________________  INITIAL IMPRESSION / MDM / [place] / ED COURSE  As part of my medical decision making, I reviewed the following data within the electronic MEDICAL RECORD NUMBER Notes from prior ED visits and [place] Controlled Substance Database      *Janice Perkins was evaluated in Emergency Department on 04/14/2019 for the symptoms described in the history of present illness. She was evaluated in the context of the global COVID-19 pandemic, which necessitated consideration that the patient might be at risk for infection with the SARS-CoV-2 virus that causes COVID-19. Institutional protocols and algorithms that pertain to the evaluation of patients at risk for COVID-19 are in a state of rapid change based on information released by regulatory bodies including the CDC and federal and state organizations. These policies and algorithms were followed during the patient's care in the ED.  Some ED evaluations and interventions may be delayed as a result of limited staffing during the pandemic.*      Medical Decision Making: 85 year old female with multiple medical comorbidities as above here with generalized weakness and pain.  Regarding her pain, she has a well-documented history of chronic pain for which she sees the pain clinic.  I suspect she likely has a component of acute on chronic pain secondary to a recent hospitalization and illness.  Will give a dose of oxycodone here.    Regarding  her weakness, suspect this may be 2/2 her pain and deconditioning. Na level is actually improved here. She is awake, alert, with no signs to suggest central pontine demyelination or other sodium related encephalopathy.  No signs of occult UTI.  Chest x-ray is pending.  Lab work is otherwise very reassuring.  Will check LFTs, chest x-ray, and I suspect the patient can be continued to manage as an outpatient if lab work is overall unremarkable.  She does express some concern about her high blood pressure which I suspect is multifactorial secondary to her pain as well as steroid injections.  Will see if this improves with oxycodone.  ____________________________________________  FINAL CLINICAL IMPRESSION(S) / ED DIAGNOSES  Final diagnoses:  Dehydration  Generalized weakness     MEDICATIONS GIVEN DURING THIS VISIT:  Medications  0.9 %  sodium chloride infusion ( Intravenous New Bag/Given 04/14/19 1524)  oxyCODONE-acetaminophen (PERCOCET/ROXICET) 5-325 MG per tablet 1 tablet (1 tablet Oral Given 04/14/19 1600)     ED Discharge Orders    None       Note:  This document was prepared using Dragon voice recognition software and may include unintentional dictation errors.   Duffy Bruce, MD 04/14/19 [phone]

## 2019-04-14 NOTE — ED Triage Notes (Signed)
Pt states she was admitted for 1 day for hyponatremia 122, states she was doing ok and had her follow up with PCP on Wednesday, states last night she had N/V and is just feeling weak today, called Dr. Ubaldo Glassing and referred to the ED.

## 2019-04-20 ENCOUNTER — Encounter: Payer: Self-pay | Admitting: Emergency Medicine

## 2019-04-20 ENCOUNTER — Other Ambulatory Visit: Payer: Self-pay

## 2019-04-20 ENCOUNTER — Emergency Department
Admission: EM | Admit: 2019-04-20 | Discharge: 2019-04-20 | Disposition: A | Discharge status: Home / Self Care | Payer: Medicare Other | Attending: Emergency Medicine | Admitting: Emergency Medicine

## 2019-04-20 DIAGNOSIS — Z853 Personal history of malignant neoplasm of breast: Secondary | ICD-10-CM | POA: Diagnosis not present

## 2019-04-20 DIAGNOSIS — J449 Chronic obstructive pulmonary disease, unspecified: Secondary | ICD-10-CM | POA: Diagnosis not present

## 2019-04-20 DIAGNOSIS — Z79899 Other long term (current) drug therapy: Secondary | ICD-10-CM | POA: Insufficient documentation

## 2019-04-20 DIAGNOSIS — I10 Essential (primary) hypertension: Secondary | ICD-10-CM | POA: Insufficient documentation

## 2019-04-20 LAB — CBC WITH DIFFERENTIAL/PLATELET
Abs Immature Granulocytes: 0.03 10*3/uL (ref 0.00–0.07)
Basophils Absolute: 0.1 10*3/uL (ref 0.0–0.1)
Basophils Relative: 1 %
Eosinophils Absolute: 0.8 10*3/uL — ABNORMAL HIGH (ref 0.0–0.5)
Eosinophils Relative: 9 %
HCT: 41.5 % (ref 36.0–46.0)
Hemoglobin: 13.7 g/dL (ref 12.0–15.0)
Immature Granulocytes: 0 %
Lymphocytes Relative: 18 %
Lymphs Abs: 1.6 10*3/uL (ref 0.7–4.0)
MCH: 29.7 pg (ref 26.0–34.0)
MCHC: 33 g/dL (ref 30.0–36.0)
MCV: 90 fL (ref 80.0–100.0)
Monocytes Absolute: 0.9 10*3/uL (ref 0.1–1.0)
Monocytes Relative: 10 %
Neutro Abs: 5.5 10*3/uL (ref 1.7–7.7)
Neutrophils Relative %: 62 %
Platelets: 212 10*3/uL (ref 150–400)
RBC: 4.61 MIL/uL (ref 3.87–5.11)
RDW: 13.2 % (ref 11.5–15.5)
WBC: 8.9 10*3/uL (ref 4.0–10.5)
nRBC: 0 % (ref 0.0–0.2)

## 2019-04-20 LAB — COMPREHENSIVE METABOLIC PANEL
ALT: 16 U/L (ref 0–44)
AST: 22 U/L (ref 15–41)
Albumin: 3.9 g/dL (ref 3.5–5.0)
Alkaline Phosphatase: 51 U/L (ref 38–126)
Anion gap: 11 (ref 5–15)
BUN: 14 mg/dL (ref 8–23)
CO2: 27 mmol/L (ref 22–32)
Calcium: 9.6 mg/dL (ref 8.9–10.3)
Chloride: 97 mmol/L — ABNORMAL LOW (ref 98–111)
Creatinine, Ser: 0.99 mg/dL (ref 0.44–1.00)
GFR calc Af Amer: >60 mL/min (ref >60)
GFR calc non Af Amer: 52 mL/min — ABNORMAL LOW (ref >60)
Glucose, Bld: 125 mg/dL — ABNORMAL HIGH (ref 70–99)
Potassium: 4.7 mmol/L (ref 3.5–5.1)
Sodium: 135 mmol/L (ref 135–145)
Total Bilirubin: 0.8 mg/dL (ref 0.3–1.2)
Total Protein: 6.9 g/dL (ref 6.5–8.1)

## 2019-04-20 LAB — TROPONIN I (HIGH SENSITIVITY)
Troponin I (High Sensitivity): 11 ng/L (ref <18)
Troponin I (High Sensitivity): 10 ng/L (ref <18)

## 2019-04-20 NOTE — ED Provider Notes (Signed)
[facility] Emergency Department Provider Note  ____________________________________________  Time seen: Approximately 4:23 AM  I have reviewed the triage vital signs and the nursing notes.   HISTORY  Chief Complaint Hypertension   HPI Janice Perkins is a 85 y.o. female with a history of atrial fibrillation not on anticoagulation, vertigo, COPD, hypertension, peptic ulcer disease who presents for evaluation of high blood pressure.  Patient reports that she woke up this morning covered in sweat.  She noted tingling of her right hand which prompted her to check her blood pressure.  Her blood pressure was elevated at 193/98.  She took 1 extra clonidine and decided to come to the emergency room for evaluation.  She feels back to baseline.  No headache, no slurred speech, no unilateral weakness or numbness, no vertigo, no chest pain, no shortness of breath, no fever, no nausea, no vomiting, no diarrhea or abdominal pain.  Patient denies any prior history of stroke.  Patient reports that she has a difficult time controlling her blood pressure.   Past Medical History:  Diagnosis Date  . Asthma   . Atrial fibrillation ([place])   . Breast cancer ([place]) 2011  . Bronchitis 04/2015  . Cancer [facility]) 2011   breast  . COPD (chronic obstructive pulmonary disease) ([place])   . Hypertension   . Shingles    10/2015    Patient Active Problem List   Diagnosis Date Noted  . Duodenum ulcer   . Melena   . GI bleed 02/17/2019  . Hypertensive encephalopathy 12/11/2018  . Hypoxia 05/30/2018  . Leukocytosis 04/05/2018  . Pneumonia 11/15/2017  . Pleural effusion 05/16/2017  . AF (paroxysmal atrial fibrillation) ([place]) 05/16/2017  . Breast cancer ([place]) 05/16/2017  . Dependence on nocturnal oxygen therapy 05/16/2017  . HTN (hypertension) 05/16/2017  . Generalized weakness 03/07/2017  . Hyponatremia 03/07/2017  . Dehydration 03/07/2017  . UTI (urinary tract infection) 03/07/2017  .  HCAP (healthcare-associated pneumonia) 01/19/2017  . AKI (acute kidney injury) ([place]) 12/18/2016  . Primary cancer of upper outer quadrant of right female breast ([place]) 04/07/2016  . Lumbar radiculopathy 03/23/2016  . Spinal stenosis, lumbar region, with neurogenic claudication 03/23/2016  . DDD (degenerative disc disease), lumbar 01/14/2015  . Lumbosacral facet joint syndrome 01/14/2015  . Sacroiliac joint dysfunction 01/14/2015  . Greater trochanteric bursitis 01/14/2015  . Bilateral occipital neuralgia 01/14/2015    Past Surgical History:  Procedure Laterality Date  . BREAST EXCISIONAL BIOPSY Right 11/29/13   two areas FAT NECROSIS  . BREAST EXCISIONAL BIOPSY Right 10/30/2009   lumpectomy - radiation  . COLONOSCOPY WITH PROPOFOL N/A 06/10/2018   Procedure: COLONOSCOPY WITH PROPOFOL;  Surgeon: Manya Silvas, MD;  Location: [facility] ENDOSCOPY;  Service: Endoscopy;  Laterality: N/A;  . COLONOSCOPY WITH PROPOFOL N/A 02/20/2019   Procedure: COLONOSCOPY WITH PROPOFOL;  Surgeon: Lucilla Lame, MD;  Location: [facility] ENDOSCOPY;  Service: Endoscopy;  Laterality: N/A;  . ESOPHAGOGASTRODUODENOSCOPY (EGD) WITH PROPOFOL N/A 06/10/2018   Procedure: ESOPHAGOGASTRODUODENOSCOPY (EGD) WITH PROPOFOL;  Surgeon: Manya Silvas, MD;  Location: [facility] ENDOSCOPY;  Service: Endoscopy;  Laterality: N/A;  . ESOPHAGOGASTRODUODENOSCOPY (EGD) WITH PROPOFOL N/A 02/20/2019   Procedure: ESOPHAGOGASTRODUODENOSCOPY (EGD) WITH PROPOFOL;  Surgeon: Lucilla Lame, MD;  Location: [facility] ENDOSCOPY;  Service: Endoscopy;  Laterality: N/A;  . NASAL SINUS SURGERY      Prior to Admission medications   Medication Sig Start Date End Date Taking? Authorizing Provider  albuterol (ACCUNEB) 1.25 MG/3ML nebulizer solution Inhale 3 mLs into the lungs every  6 (six) hours as needed for wheezing.     [provider]  albuterol (PROVENTIL HFA;VENTOLIN HFA) 108 (90 Base) MCG/ACT inhaler Inhale 2 puffs into the lungs every 6 (six) hours as  needed for wheezing or shortness of breath.     [provider]  alendronate (FOSAMAX) 70 MG tablet Take 70 mg by mouth once a week. 02/01/19 05/02/19  [provider]  ALPRAZolam Duanne Moron) 0.5 MG tablet Take 0.25-0.5 mg by mouth 2 (two) times a day. Take 0.25 mg in the morning, then 0.5 mg at night    [provider]  atorvastatin (LIPITOR) 40 MG tablet Take 40 mg by mouth daily. 03/17/19   [provider]  busPIRone (BUSPAR) 10 MG tablet Take 10 mg by mouth 2 (two) times daily.    [provider]  cholecalciferol (VITAMIN D) 1000 units tablet Take 1,000 Units by mouth daily.    [provider]  cloNIDine (CATAPRES) 0.1 MG tablet Take 0.1 mg by mouth 2 (two) times daily as needed. 02/13/19   [provider]  diclofenac sodium (VOLTAREN) 1 % GEL Apply 2 g topically 4 (four) times daily. 02/01/19   [provider]  diltiazem (DILACOR XR) 120 MG 24 hr capsule Take 120 mg by mouth daily.    [provider]  escitalopram (LEXAPRO) 5 MG tablet Take 5 mg by mouth daily.    [provider]  esomeprazole (NEXIUM) 40 MG capsule Take 40 mg by mouth 2 (two) times daily before a meal.     [provider]  ferrous sulfate 325 (65 FE) MG tablet Take 325 mg by mouth daily with breakfast.    [provider]  Fluticasone-Salmeterol (ADVAIR) 250-50 MCG/DOSE AEPB Inhale 1 puff into the lungs 2 (two) times daily.    [provider]  hydrALAZINE (APRESOLINE) 10 MG tablet Take 5 tablets (50 mg total) by mouth 3 (three) times daily. 02/21/19   Fritzi Mandes, MD  levothyroxine (SYNTHROID, LEVOTHROID) 50 MCG tablet Take 50 mcg by mouth daily before breakfast.    [provider]  loratadine (CLARITIN) 10 MG tablet Take 10 mg by mouth daily.    [provider]  losartan (COZAAR) 50 MG tablet Take 2 tablets (100 mg total) by mouth at bedtime. 12/13/18   Loletha Grayer, MD  magnesium oxide (MAG-OX) 400 MG  tablet Take 400 mg by mouth 2 (two) times daily.    [provider]  meclizine (ANTIVERT) 25 MG tablet Take 1 tablet (25 mg total) by mouth 2 (two) times daily as needed for dizziness. 09/16/18   Merlyn Lot, MD  meloxicam (MOBIC) 15 MG tablet Take 15 mg by mouth daily. 02/07/19   [provider]  mometasone-formoterol (DULERA) 100-5 MCG/ACT AERO Inhale 2 puffs into the lungs 2 (two) times daily.    [provider]  oxyCODONE-acetaminophen (PERCOCET/ROXICET) 5-325 MG tablet Take 1 tablet by mouth daily. 04/07/19 05/07/19  [provider]  polyethylene glycol (MIRALAX / GLYCOLAX) 17 g packet Take 17 g by mouth daily. 01/03/19   Earleen Newport, MD  RABEprazole (ACIPHEX) 20 MG tablet Take 20 mg by mouth 2 (two) times daily. 03/23/19   [provider]    Allergies Diphenhydramine hcl, Penicillins, Captopril, Eliquis [apixaban], Enalapril maleate, Tape, Terfenadine, Augmentin [amoxicillin-pot clavulanate], and Biaxin [clarithromycin]  Family History  Problem Relation Age of Onset  . Hypertension Mother   . Heart disease Mother   . Cancer Mother   . Stroke Father   .  Hypertension Father   . Breast cancer Sister     Social History Social History   Tobacco Use  . Smoking status: Never Smoker  . Smokeless tobacco: Never Used  Substance Use Topics  . Alcohol use: No    Alcohol/week: 0.0 standard drinks  . Drug use: No    Review of Systems  Constitutional: Negative for fever. Eyes: Negative for visual changes. ENT: Negative for sore throat. Neck: No neck pain  Cardiovascular: Negative for chest pain. Respiratory: Negative for shortness of breath. Gastrointestinal: Negative for abdominal pain, vomiting or diarrhea. Genitourinary: Negative for dysuria. Musculoskeletal: Negative for back pain. Skin: Negative for rash. Neurological: Negative for headaches, weakness or numbness. + R hand tingling Psych: No SI or HI   ____________________________________________   PHYSICAL EXAM:  VITAL SIGNS: ED Triage Vitals  Enc Vitals Group     BP 04/20/19 0315 119/66     Pulse Rate 04/20/19 0315 64     Resp 04/20/19 0315 18     Temp 04/20/19 0315 97.9 F (36.6 C)     Temp Source 04/20/19 0315 Oral     SpO2 04/20/19 0315 96 %     Weight --      Height --      Head Circumference --      Peak Flow --      Pain Score 04/20/19 0313 0     Pain Loc --      Pain Edu? --      Excl. in [place]? --     Constitutional: Alert and oriented. Well appearing and in no apparent distress. HEENT:      Head: Normocephalic and atraumatic.         Eyes: Conjunctivae are normal. Sclera is non-icteric.       Mouth/Throat: Mucous membranes are moist.       Neck: Supple with no signs of meningismus. Cardiovascular: Regular rate and rhythm. No murmurs, gallops, or rubs. 2+ symmetrical distal pulses are present in all extremities. No JVD. Respiratory: Normal respiratory effort. Lungs are clear to auscultation bilaterally. No wheezes, crackles, or rhonchi.  Gastrointestinal: Soft, non tender, and non distended with positive bowel sounds. No rebound or guarding. Musculoskeletal: Nontender with normal range of motion in all extremities. No edema, cyanosis, or erythema of extremities. Neurologic: Normal speech and language. Face is symmetric. EOMI, intact strength and sensation x4, no pronator drift, no dysmetria Skin: Skin is warm, dry and intact. No rash noted. Psychiatric: Mood and affect are normal. Speech and behavior are normal.  ____________________________________________   LABS (all labs ordered are listed, but only abnormal results are displayed)  Labs Reviewed  CBC WITH DIFFERENTIAL/PLATELET - Abnormal; Notable for the following components:      Result Value   Eosinophils Absolute 0.8 (*)    All other components within normal limits  COMPREHENSIVE METABOLIC PANEL - Abnormal; Notable for the following components:    Chloride 97 (*)    Glucose, Bld 125 (*)    GFR calc non Af Amer 52 (*)    All other components within normal limits  TROPONIN I (HIGH SENSITIVITY)  TROPONIN I (HIGH SENSITIVITY)   ____________________________________________  EKG  ED ECG REPORT I, Rudene Re, the attending physician, personally viewed and interpreted this ECG.  Atrial fibrillation, rate of 66, normal QTC, normal axis, no ST elevations or depressions.  Unchanged from prior. ____________________________________________  RADIOLOGY  none  ____________________________________________   PROCEDURES  Procedure(s) performed: None Procedures Critical Care performed:  None ____________________________________________  INITIAL IMPRESSION / ASSESSMENT AND PLAN / ED COURSE  85 y.o. female with a history of atrial fibrillation not on anticoagulation, vertigo, COPD, hypertension, peptic ulcer disease who presents for evaluation of high blood pressure and tingling of the R hand which has now resolved after patient took one extra clonidine prior to arrival. BP is the 110s, neurologically intact. No HA or CP. EKG with no evidence of ischemia. Will check labs to eval for end organ damage. Patient instructed to keep a BP diary to assist with her doctor's medical management of her BP.      _________________________ 6:41 AM on 04/20/2019 -----------------------------------------  Labs showing no endorgan damage.  Troponin x2 is negative.  Blood pressure remains well controlled.  No further medical complaints.  At this time will discharge home.  Encourage patient to keep a blood pressure diary and follow-up closely with her primary care doctor.  Discussed my standard return precautions     As part of my medical decision making, I reviewed the following data within the [place] History obtained from family, Nursing notes reviewed and incorporated, Labs reviewed , EKG interpreted , Old EKG reviewed, Old  chart reviewed, Radiograph reviewed , Notes from prior ED visits and [place] Controlled Substance Database   Patient was evaluated in Emergency Department today for the symptoms described in the history of present illness. Patient was evaluated in the context of the global COVID-19 pandemic, which necessitated consideration that the patient might be at risk for infection with the SARS-CoV-2 virus that causes COVID-19. Institutional protocols and algorithms that pertain to the evaluation of patients at risk for COVID-19 are in a state of rapid change based on information released by regulatory bodies including the CDC and federal and state organizations. These policies and algorithms were followed during the patient's care in the ED.   ____________________________________________   FINAL CLINICAL IMPRESSION(S) / ED DIAGNOSES   Final diagnoses:  Essential hypertension      NEW MEDICATIONS STARTED DURING THIS VISIT:  ED Discharge Orders    None       Note:  This document was prepared using Dragon voice recognition software and may include unintentional dictation errors.    Alfred Levins, [place], MD 04/20/19 [phone]

## 2019-04-20 NOTE — ED Notes (Signed)
Pt very talkative with multiple complaints including high blood pressure, a fib, vertigo, dark stools, nausea, nose bleeds, tingling of right hand, sweating and anxiety. States she has been at the ED every week for the last month or so and no one can get her fixed.

## 2019-04-20 NOTE — ED Notes (Signed)
Call to lab to check on results, Janice Perkins states it will result at 0550

## 2019-04-20 NOTE — ED Triage Notes (Signed)
Patient ambulatory to triage with steady gait, without difficulty or distress noted, mask in place; pt reports HTN and "has been here every week for the same"; st she awoke with nausea and numbness to right hand, took BP and it was 193/98

## 2019-05-19 ENCOUNTER — Emergency Department
Admission: EM | Admit: 2019-05-19 | Discharge: 2019-05-19 | Disposition: A | Discharge status: Home / Self Care | Payer: Medicare Other | Attending: Emergency Medicine | Admitting: Emergency Medicine

## 2019-05-19 ENCOUNTER — Emergency Department: Payer: Medicare Other

## 2019-05-19 ENCOUNTER — Other Ambulatory Visit: Payer: Self-pay

## 2019-05-19 DIAGNOSIS — I10 Essential (primary) hypertension: Secondary | ICD-10-CM | POA: Diagnosis not present

## 2019-05-19 DIAGNOSIS — I4891 Unspecified atrial fibrillation: Secondary | ICD-10-CM | POA: Insufficient documentation

## 2019-05-19 DIAGNOSIS — Z853 Personal history of malignant neoplasm of breast: Secondary | ICD-10-CM | POA: Diagnosis not present

## 2019-05-19 DIAGNOSIS — J45909 Unspecified asthma, uncomplicated: Secondary | ICD-10-CM | POA: Diagnosis not present

## 2019-05-19 DIAGNOSIS — R0602 Shortness of breath: Secondary | ICD-10-CM | POA: Diagnosis present

## 2019-05-19 DIAGNOSIS — J219 Acute bronchiolitis, unspecified: Secondary | ICD-10-CM | POA: Insufficient documentation

## 2019-05-19 DIAGNOSIS — J40 Bronchitis, not specified as acute or chronic: Secondary | ICD-10-CM

## 2019-05-19 LAB — COMPREHENSIVE METABOLIC PANEL
ALT: 20 U/L (ref 0–44)
AST: 24 U/L (ref 15–41)
Albumin: 4.3 g/dL (ref 3.5–5.0)
Alkaline Phosphatase: 63 U/L (ref 38–126)
Anion gap: 13 (ref 5–15)
BUN: 18 mg/dL (ref 8–23)
CO2: 25 mmol/L (ref 22–32)
Calcium: 9.8 mg/dL (ref 8.9–10.3)
Chloride: 90 mmol/L — ABNORMAL LOW (ref 98–111)
Creatinine, Ser: 1.03 mg/dL — ABNORMAL HIGH (ref 0.44–1.00)
GFR calc Af Amer: 58 mL/min — ABNORMAL LOW (ref >60)
GFR calc non Af Amer: 50 mL/min — ABNORMAL LOW (ref >60)
Glucose, Bld: 121 mg/dL — ABNORMAL HIGH (ref 70–99)
Potassium: 4.5 mmol/L (ref 3.5–5.1)
Sodium: 128 mmol/L — ABNORMAL LOW (ref 135–145)
Total Bilirubin: 0.6 mg/dL (ref 0.3–1.2)
Total Protein: 8 g/dL (ref 6.5–8.1)

## 2019-05-19 LAB — CBC WITH DIFFERENTIAL/PLATELET
Abs Immature Granulocytes: 0.34 10*3/uL — ABNORMAL HIGH (ref 0.00–0.07)
Basophils Absolute: 0.1 10*3/uL (ref 0.0–0.1)
Basophils Relative: 1 %
Eosinophils Absolute: 0.5 10*3/uL (ref 0.0–0.5)
Eosinophils Relative: 4 %
HCT: 43.5 % (ref 36.0–46.0)
Hemoglobin: 14.5 g/dL (ref 12.0–15.0)
Immature Granulocytes: 3 %
Lymphocytes Relative: 8 %
Lymphs Abs: 1 10*3/uL (ref 0.7–4.0)
MCH: 29.2 pg (ref 26.0–34.0)
MCHC: 33.3 g/dL (ref 30.0–36.0)
MCV: 87.5 fL (ref 80.0–100.0)
Monocytes Absolute: 1.5 10*3/uL — ABNORMAL HIGH (ref 0.1–1.0)
Monocytes Relative: 11 %
Neutro Abs: 9.8 10*3/uL — ABNORMAL HIGH (ref 1.7–7.7)
Neutrophils Relative %: 73 %
Platelets: 301 10*3/uL (ref 150–400)
RBC: 4.97 MIL/uL (ref 3.87–5.11)
RDW: 13.3 % (ref 11.5–15.5)
WBC: 13.3 10*3/uL — ABNORMAL HIGH (ref 4.0–10.5)
nRBC: 0 % (ref 0.0–0.2)

## 2019-05-19 LAB — TROPONIN I (HIGH SENSITIVITY): Troponin I (High Sensitivity): 12 ng/L (ref <18)

## 2019-05-19 MED ORDER — AZITHROMYCIN 500 MG PO TABS
500.0000 mg | ORAL_TABLET | Freq: Once | ORAL | Status: AC
  Administered 2019-05-19: 500 mg via ORAL
  Filled 2019-05-19: qty 1

## 2019-05-19 MED ORDER — AZITHROMYCIN 250 MG PO TABS: ORAL_TABLET | ORAL | 0 refills | Status: DC

## 2019-05-19 MED ORDER — HYDROCOD POLST-CPM POLST ER 10-8 MG/5ML PO SUER: 5.0000 mL | Freq: Two times a day (BID) | ORAL | 0 refills | Status: DC

## 2019-05-19 MED ORDER — HYDROCOD POLST-CPM POLST ER 10-8 MG/5ML PO SUER
5.0000 mL | Freq: Once | ORAL | Status: AC
  Administered 2019-05-19: 5 mL via ORAL
  Filled 2019-05-19: qty 5

## 2019-05-19 NOTE — ED Triage Notes (Signed)
Pt to the er for SOB, HTN, wheezing. Pt denies fever and cough. Pt has a hx of seasonal allergies. Pt states she is very hoarse but pt is speaking in long complete sentences.

## 2019-05-19 NOTE — ED Provider Notes (Signed)
[facility] Emergency Department Provider Note       Time seen: ----------------------------------------- 9:26 PM on 05/19/2019 -----------------------------------------   I have reviewed the triage vital signs and the nursing notes.  HISTORY   Chief Complaint Shortness of Breath    HPI Janice Perkins is a 85 y.o. female with a history of asthma, atrial fibrillation, breast cancer, bronchitis, COPD, hypertension who presents to the ED for shortness of breath with hypertension and wheezing.  Patient denies fever and cough.  She does have seasonal allergies.  Patient states she is very hoarse but is speaking without any difficulty on arrival.  She has no pain at this time.  Past Medical History:  Diagnosis Date  . Asthma   . Atrial fibrillation ([place])   . Breast cancer ([place]) 2011  . Bronchitis 04/2015  . Cancer [facility]) 2011   breast  . COPD (chronic obstructive pulmonary disease) ([place])   . Hypertension   . Shingles    10/2015    Patient Active Problem List   Diagnosis Date Noted  . Duodenum ulcer   . Melena   . GI bleed 02/17/2019  . Hypertensive encephalopathy 12/11/2018  . Hypoxia 05/30/2018  . Leukocytosis 04/05/2018  . Pneumonia 11/15/2017  . Pleural effusion 05/16/2017  . AF (paroxysmal atrial fibrillation) ([place]) 05/16/2017  . Breast cancer ([place]) 05/16/2017  . Dependence on nocturnal oxygen therapy 05/16/2017  . HTN (hypertension) 05/16/2017  . Generalized weakness 03/07/2017  . Hyponatremia 03/07/2017  . Dehydration 03/07/2017  . UTI (urinary tract infection) 03/07/2017  . HCAP (healthcare-associated pneumonia) 01/19/2017  . AKI (acute kidney injury) ([place]) 12/18/2016  . Primary cancer of upper outer quadrant of right female breast ([place]) 04/07/2016  . Lumbar radiculopathy 03/23/2016  . Spinal stenosis, lumbar region, with neurogenic claudication 03/23/2016  . DDD (degenerative disc disease), lumbar 01/14/2015  . Lumbosacral facet joint  syndrome 01/14/2015  . Sacroiliac joint dysfunction 01/14/2015  . Greater trochanteric bursitis 01/14/2015  . Bilateral occipital neuralgia 01/14/2015    Past Surgical History:  Procedure Laterality Date  . BREAST EXCISIONAL BIOPSY Right 11/29/13   two areas FAT NECROSIS  . BREAST EXCISIONAL BIOPSY Right 10/30/2009   lumpectomy - radiation  . COLONOSCOPY WITH PROPOFOL N/A 06/10/2018   Procedure: COLONOSCOPY WITH PROPOFOL;  Surgeon: Manya Silvas, MD;  Location: [facility] ENDOSCOPY;  Service: Endoscopy;  Laterality: N/A;  . COLONOSCOPY WITH PROPOFOL N/A 02/20/2019   Procedure: COLONOSCOPY WITH PROPOFOL;  Surgeon: Lucilla Lame, MD;  Location: [facility] ENDOSCOPY;  Service: Endoscopy;  Laterality: N/A;  . ESOPHAGOGASTRODUODENOSCOPY (EGD) WITH PROPOFOL N/A 06/10/2018   Procedure: ESOPHAGOGASTRODUODENOSCOPY (EGD) WITH PROPOFOL;  Surgeon: Manya Silvas, MD;  Location: [facility] ENDOSCOPY;  Service: Endoscopy;  Laterality: N/A;  . ESOPHAGOGASTRODUODENOSCOPY (EGD) WITH PROPOFOL N/A 02/20/2019   Procedure: ESOPHAGOGASTRODUODENOSCOPY (EGD) WITH PROPOFOL;  Surgeon: Lucilla Lame, MD;  Location: [facility] ENDOSCOPY;  Service: Endoscopy;  Laterality: N/A;  . NASAL SINUS SURGERY      Allergies Diphenhydramine hcl, Penicillins, Captopril, Eliquis [apixaban], Enalapril maleate, Tape, Terfenadine, Augmentin [amoxicillin-pot clavulanate], and Biaxin [clarithromycin]  Social History Social History   Tobacco Use  . Smoking status: Never Smoker  . Smokeless tobacco: Never Used  Substance Use Topics  . Alcohol use: No    Alcohol/week: 0.0 standard drinks  . Drug use: No    Review of Systems Constitutional: Negative for fever. Cardiovascular: Negative for chest pain. Respiratory: Positive for shortness of breath and wheezing Gastrointestinal: Negative for abdominal pain, vomiting and diarrhea. Musculoskeletal: Negative for back  pain. Skin: Negative for rash. Neurological: Negative for headaches, focal weakness or  numbness.  All systems negative/normal/unremarkable except as stated in the HPI  ____________________________________________   PHYSICAL EXAM:  VITAL SIGNS: ED Triage Vitals  Enc Vitals Group     BP 05/19/19 1630 (!) 176/84     Pulse Rate 05/19/19 1630 78     Resp 05/19/19 1630 16     Temp 05/19/19 1630 98.2 F (36.8 C)     Temp Source 05/19/19 1630 Oral     SpO2 05/19/19 1630 97 %     Weight 05/19/19 1621 155 lb (70.3 kg)     Height 05/19/19 1621 5' (1.524 m)     Head Circumference --      Peak Flow --      Pain Score 05/19/19 1621 0     Pain Loc --      Pain Edu? --      Excl. in [place]? --    Constitutional: Alert and oriented. Well appearing and in no distress. Eyes: Conjunctivae are normal. Normal extraocular movements. Cardiovascular: Normal rate, regular rhythm. No murmurs, rubs, or gallops. Respiratory: Normal respiratory effort without tachypnea nor retractions. Breath sounds are clear and equal bilaterally. No wheezes/rales/rhonchi. Gastrointestinal: Soft and nontender. Normal bowel sounds Musculoskeletal: Nontender with normal range of motion in extremities. No lower extremity tenderness nor edema. Neurologic:  Normal speech and language. No gross focal neurologic deficits are appreciated.  Skin:  Skin is warm, dry and intact. No rash noted. Psychiatric: Mood and affect are normal. Speech and behavior are normal.  ____________________________________________  EKG: Interpreted by me.  Atrial fibrillation the rate of 72 bpm, left axis deviation, possible septal infarct age-indeterminate, normal QT  ____________________________________________  ED COURSE:  As part of my medical decision making, I reviewed the following data within the [place] History obtained from family if available, nursing notes, old chart and ekg, as well as notes from prior ED visits. Patient presented for shortness of breath, hypertension and wheezing, we will assess with  labs and imaging as indicated at this time.   Procedures  Janice Perkins was evaluated in Emergency Department on 05/19/2019 for the symptoms described in the history of present illness. She was evaluated in the context of the global COVID-19 pandemic, which necessitated consideration that the patient might be at risk for infection with the SARS-CoV-2 virus that causes COVID-19. Institutional protocols and algorithms that pertain to the evaluation of patients at risk for COVID-19 are in a state of rapid change based on information released by regulatory bodies including the CDC and federal and state organizations. These policies and algorithms were followed during the patient's care in the ED.  ____________________________________________   LABS (pertinent positives/negatives)  Labs Reviewed  CBC WITH DIFFERENTIAL/PLATELET - Abnormal; Notable for the following components:      Result Value   WBC 13.3 (*)    Neutro Abs 9.8 (*)    Monocytes Absolute 1.5 (*)    Abs Immature Granulocytes 0.34 (*)    All other components within normal limits  COMPREHENSIVE METABOLIC PANEL - Abnormal; Notable for the following components:   Sodium 128 (*)    Chloride 90 (*)    Glucose, Bld 121 (*)    Creatinine, Ser 1.03 (*)    GFR calc non Af Amer 50 (*)    GFR calc Af Amer 58 (*)    All other components within normal limits  URINALYSIS, COMPLETE (UACMP) WITH MICROSCOPIC  TROPONIN  I (HIGH SENSITIVITY)  TROPONIN I (HIGH SENSITIVITY)    RADIOLOGY chest x-ray IMPRESSION: No acute abnormality. Stable mild changes of COPD and chronic bronchitis.  ____________________________________________   DIFFERENTIAL DIAGNOSIS   CHF, COPD, pneumonia, bronchitis, COVID-19  FINAL ASSESSMENT AND PLAN  Bronchitis   Plan: The patient had presented for shortness of breath with hypertension and wheezing. Patient's labs did reveal some leukocytosis as well as mild hyponatremia. Patient's imaging did not reveal any  acute process.  She will be given cough medicine and antibiotics and is cleared for outpatient follow-up.   Laurence Aly, MD    Note: This note was generated in part or whole with voice recognition software. Voice recognition is usually quite accurate but there are transcription errors that can and very often do occur. I apologize for any typographical errors that were not detected and corrected.     Earleen Newport, MD 05/19/19 2214

## 2019-05-31 ENCOUNTER — Encounter: Payer: Self-pay | Admitting: Emergency Medicine

## 2019-05-31 ENCOUNTER — Emergency Department: Payer: Medicare Other

## 2019-05-31 ENCOUNTER — Observation Stay
Admission: EM | Admit: 2019-05-31 | Discharge: 2019-06-01 | Disposition: A | Discharge status: Home / Self Care | Payer: Medicare Other | Attending: Internal Medicine | Admitting: Internal Medicine

## 2019-05-31 ENCOUNTER — Other Ambulatory Visit: Payer: Self-pay

## 2019-05-31 DIAGNOSIS — J40 Bronchitis, not specified as acute or chronic: Secondary | ICD-10-CM | POA: Diagnosis present

## 2019-05-31 DIAGNOSIS — J209 Acute bronchitis, unspecified: Secondary | ICD-10-CM | POA: Diagnosis not present

## 2019-05-31 DIAGNOSIS — Z791 Long term (current) use of non-steroidal anti-inflammatories (NSAID): Secondary | ICD-10-CM | POA: Insufficient documentation

## 2019-05-31 DIAGNOSIS — Z20828 Contact with and (suspected) exposure to other viral communicable diseases: Secondary | ICD-10-CM | POA: Diagnosis not present

## 2019-05-31 DIAGNOSIS — Z79890 Hormone replacement therapy (postmenopausal): Secondary | ICD-10-CM | POA: Insufficient documentation

## 2019-05-31 DIAGNOSIS — M5136 Other intervertebral disc degeneration, lumbar region: Secondary | ICD-10-CM | POA: Diagnosis not present

## 2019-05-31 DIAGNOSIS — Z96612 Presence of left artificial shoulder joint: Secondary | ICD-10-CM | POA: Insufficient documentation

## 2019-05-31 DIAGNOSIS — I480 Paroxysmal atrial fibrillation: Secondary | ICD-10-CM | POA: Diagnosis not present

## 2019-05-31 DIAGNOSIS — Z7951 Long term (current) use of inhaled steroids: Secondary | ICD-10-CM | POA: Insufficient documentation

## 2019-05-31 DIAGNOSIS — E871 Hypo-osmolality and hyponatremia: Secondary | ICD-10-CM | POA: Diagnosis present

## 2019-05-31 DIAGNOSIS — E861 Hypovolemia: Secondary | ICD-10-CM | POA: Diagnosis not present

## 2019-05-31 DIAGNOSIS — I700 Atherosclerosis of aorta: Secondary | ICD-10-CM | POA: Diagnosis not present

## 2019-05-31 DIAGNOSIS — J449 Chronic obstructive pulmonary disease, unspecified: Secondary | ICD-10-CM | POA: Insufficient documentation

## 2019-05-31 DIAGNOSIS — Z853 Personal history of malignant neoplasm of breast: Secondary | ICD-10-CM | POA: Insufficient documentation

## 2019-05-31 DIAGNOSIS — Z79899 Other long term (current) drug therapy: Secondary | ICD-10-CM | POA: Diagnosis not present

## 2019-05-31 DIAGNOSIS — E039 Hypothyroidism, unspecified: Secondary | ICD-10-CM | POA: Diagnosis not present

## 2019-05-31 DIAGNOSIS — I10 Essential (primary) hypertension: Secondary | ICD-10-CM | POA: Diagnosis present

## 2019-05-31 DIAGNOSIS — R531 Weakness: Secondary | ICD-10-CM | POA: Insufficient documentation

## 2019-05-31 LAB — BASIC METABOLIC PANEL
Anion gap: 10 (ref 5–15)
BUN: 20 mg/dL (ref 8–23)
CO2: 26 mmol/L (ref 22–32)
Calcium: 9.2 mg/dL (ref 8.9–10.3)
Chloride: 89 mmol/L — ABNORMAL LOW (ref 98–111)
Creatinine, Ser: 1.03 mg/dL — ABNORMAL HIGH (ref 0.44–1.00)
GFR calc Af Amer: 58 mL/min — ABNORMAL LOW (ref >60)
GFR calc non Af Amer: 50 mL/min — ABNORMAL LOW (ref >60)
Glucose, Bld: 118 mg/dL — ABNORMAL HIGH (ref 70–99)
Potassium: 4.3 mmol/L (ref 3.5–5.1)
Sodium: 125 mmol/L — ABNORMAL LOW (ref 135–145)

## 2019-05-31 LAB — URINALYSIS, COMPLETE (UACMP) WITH MICROSCOPIC
Bilirubin Urine: NEGATIVE
Glucose, UA: NEGATIVE mg/dL
Hgb urine dipstick: NEGATIVE
Ketones, ur: NEGATIVE mg/dL
Nitrite: NEGATIVE
Protein, ur: NEGATIVE mg/dL
Specific Gravity, Urine: 1.005 (ref 1.005–1.030)
pH: 6 (ref 5.0–8.0)

## 2019-05-31 LAB — CBC
HCT: 38.3 % (ref 36.0–46.0)
Hemoglobin: 13 g/dL (ref 12.0–15.0)
MCH: 29.5 pg (ref 26.0–34.0)
MCHC: 33.9 g/dL (ref 30.0–36.0)
MCV: 87 fL (ref 80.0–100.0)
Platelets: 204 10*3/uL (ref 150–400)
RBC: 4.4 MIL/uL (ref 3.87–5.11)
RDW: 13.9 % (ref 11.5–15.5)
WBC: 9.5 10*3/uL (ref 4.0–10.5)
nRBC: 0 % (ref 0.0–0.2)

## 2019-05-31 MED ORDER — PANTOPRAZOLE SODIUM 20 MG PO TBEC
20.0000 mg | DELAYED_RELEASE_TABLET | Freq: Two times a day (BID) | ORAL | Status: DC
  Administered 2019-06-01: 08:00:00 20 mg via ORAL
  Filled 2019-05-31 (×3): qty 1

## 2019-05-31 MED ORDER — ONDANSETRON HCL 4 MG PO TABS: 4.0000 mg | ORAL_TABLET | Freq: Four times a day (QID) | ORAL | Status: DC | PRN

## 2019-05-31 MED ORDER — AZITHROMYCIN 250 MG PO TABS
500.0000 mg | ORAL_TABLET | Freq: Every day | ORAL | Status: DC
  Administered 2019-06-01: 500 mg via ORAL
  Filled 2019-05-31: qty 2

## 2019-05-31 MED ORDER — ACETAMINOPHEN 650 MG RE SUPP: 650.0000 mg | Freq: Four times a day (QID) | RECTAL | Status: DC | PRN

## 2019-05-31 MED ORDER — LEVOTHYROXINE SODIUM 50 MCG PO TABS
50.0000 ug | ORAL_TABLET | Freq: Every day | ORAL | Status: DC
  Administered 2019-06-01: 50 ug via ORAL
  Filled 2019-05-31: qty 1

## 2019-05-31 MED ORDER — ONDANSETRON HCL 4 MG/2ML IJ SOLN: 4.0000 mg | Freq: Four times a day (QID) | INTRAMUSCULAR | Status: DC | PRN

## 2019-05-31 MED ORDER — IPRATROPIUM-ALBUTEROL 0.5-2.5 (3) MG/3ML IN SOLN: 3.0000 mL | RESPIRATORY_TRACT | Status: DC | PRN

## 2019-05-31 MED ORDER — ENOXAPARIN SODIUM 40 MG/0.4ML ~~LOC~~ SOLN
40.0000 mg | SUBCUTANEOUS | Status: DC
  Administered 2019-05-31: 40 mg via SUBCUTANEOUS
  Filled 2019-05-31: qty 0.4

## 2019-05-31 MED ORDER — SODIUM CHLORIDE 0.9 % IV BOLUS
250.0000 mL | Freq: Once | INTRAVENOUS | Status: AC
  Administered 2019-05-31: 21:00:00 250 mL via INTRAVENOUS

## 2019-05-31 MED ORDER — SODIUM CHLORIDE 0.9 % IV SOLN
INTRAVENOUS | Status: AC
  Administered 2019-05-31: via INTRAVENOUS

## 2019-05-31 MED ORDER — ACETAMINOPHEN 325 MG PO TABS: 650.0000 mg | ORAL_TABLET | Freq: Four times a day (QID) | ORAL | Status: DC | PRN

## 2019-05-31 MED ORDER — MOMETASONE FURO-FORMOTEROL FUM 100-5 MCG/ACT IN AERO
2.0000 | INHALATION_SPRAY | Freq: Two times a day (BID) | RESPIRATORY_TRACT | Status: DC
  Administered 2019-06-01: 2 via RESPIRATORY_TRACT
  Filled 2019-05-31: qty 8.8

## 2019-05-31 NOTE — ED Notes (Addendum)
Pt ambulatory to in room toilet with steady gait. Pt back in bed at this time. Call light in reach

## 2019-05-31 NOTE — H&P (Signed)
[facility] [place] NAME: Janice Perkins    MR#:  [id number]  DATE OF BIRTH:  [birth year]  DATE OF ADMISSION:  05/31/2019  PRIMARY CARE PHYSICIAN: [name], MD   REQUESTING/REFERRING PHYSICIAN: Jacqualine Code, MD  CHIEF COMPLAINT:   Chief Complaint  Patient presents with  . Hypertension    HISTORY OF PRESENT ILLNESS:  Janice Perkins  is a 85 y.o. female who presents with chief complaint as above.  Patient presents to the ED with a complaint of weakness.  She states that this has been going on for couple of days, but much worse today.  Here she was found to have hyponatremia.  She has had this problem in the past.  Her sodium was 125 today.  She also states that she has been getting treatment for bronchitis in the outpatient setting.  Hospitalist were called for admission  PAST MEDICAL HISTORY:   Past Medical History:  Diagnosis Date  . Asthma   . Atrial fibrillation ([place])   . Breast cancer ([place]) 2011  . Bronchitis 04/2015  . Cancer [facility]) 2011   breast  . COPD (chronic obstructive pulmonary disease) ([place])   . Hypertension   . Shingles    10/2015     PAST SURGICAL HISTORY:   Past Surgical History:  Procedure Laterality Date  . BREAST EXCISIONAL BIOPSY Right 11/29/13   two areas FAT NECROSIS  . BREAST EXCISIONAL BIOPSY Right 10/30/2009   lumpectomy - radiation  . COLONOSCOPY WITH PROPOFOL N/A 06/10/2018   Procedure: COLONOSCOPY WITH PROPOFOL;  Surgeon: Manya Silvas, MD;  Location: [facility] ENDOSCOPY;  Service: Endoscopy;  Laterality: N/A;  . COLONOSCOPY WITH PROPOFOL N/A 02/20/2019   Procedure: COLONOSCOPY WITH PROPOFOL;  Surgeon: Lucilla Lame, MD;  Location: [facility] ENDOSCOPY;  Service: Endoscopy;  Laterality: N/A;  . ESOPHAGOGASTRODUODENOSCOPY (EGD) WITH PROPOFOL N/A 06/10/2018   Procedure: ESOPHAGOGASTRODUODENOSCOPY (EGD) WITH PROPOFOL;  Surgeon: Manya Silvas, MD;  Location: [facility] ENDOSCOPY;  Service: Endoscopy;   Laterality: N/A;  . ESOPHAGOGASTRODUODENOSCOPY (EGD) WITH PROPOFOL N/A 02/20/2019   Procedure: ESOPHAGOGASTRODUODENOSCOPY (EGD) WITH PROPOFOL;  Surgeon: Lucilla Lame, MD;  Location: [facility] ENDOSCOPY;  Service: Endoscopy;  Laterality: N/A;  . NASAL SINUS SURGERY       SOCIAL HISTORY:   Social History   Tobacco Use  . Smoking status: Never Smoker  . Smokeless tobacco: Never Used  Substance Use Topics  . Alcohol use: No    Alcohol/week: 0.0 standard drinks     FAMILY HISTORY:   Family History  Problem Relation Age of Onset  . Hypertension Mother   . Heart disease Mother   . Cancer Mother   . Stroke Father   . Hypertension Father   . Breast cancer Sister      DRUG ALLERGIES:   Allergies  Allergen Reactions  . Diphenhydramine Hcl Rash  . Penicillins Hives    .Has patient had a PCN reaction causing immediate rash, facial/tongue/throat swelling, SOB or lightheadedness with hypotension: Unknown Has patient had a PCN reaction causing severe rash involving mucus membranes or skin necrosis: Unknown Has patient had a PCN reaction that required hospitalization: Unknown Has patient had a PCN reaction occurring within the last 10 years: Unknown If all of the above answers are "NO", then may proceed with Cephalosporin use.   . Captopril Other (See Comments)  . Eliquis [Apixaban] Other (See Comments)    Stomach bleed  . Enalapril Maleate Other (See Comments)    Other reaction(s): Unknown  .  Tape Itching  . Terfenadine Other (See Comments)  . Augmentin [Amoxicillin-Pot Clavulanate] Rash    Has patient had a PCN reaction causing immediate rash, facial/tongue/throat swelling, SOB or lightheadedness with hypotension: Unknown Has patient had a PCN reaction causing severe rash involving mucus membranes or skin necrosis: Unknown Has patient had a PCN reaction that required hospitalization: Unknown Has patient had a PCN reaction occurring within the last 10 years: Unknown If all of the  above answers are "NO", then may proceed with Cephalosporin use.  . Biaxin [Clarithromycin] Rash, Other (See Comments) and Hives    Other reaction(s): Unknown    MEDICATIONS AT HOME:   Prior to Admission medications   Medication Sig Start Date End Date Taking? Authorizing Provider  albuterol (ACCUNEB) 1.25 MG/3ML nebulizer solution Inhale 3 mLs into the lungs every 6 (six) hours as needed for wheezing.     [provider]  albuterol (PROVENTIL HFA;VENTOLIN HFA) 108 (90 Base) MCG/ACT inhaler Inhale 2 puffs into the lungs every 6 (six) hours as needed for wheezing or shortness of breath.     [provider]  ALPRAZolam Duanne Moron) 0.5 MG tablet Take 0.25-0.5 mg by mouth 2 (two) times a day. Take 0.25 mg in the morning, then 0.5 mg at night    [provider]  atorvastatin (LIPITOR) 40 MG tablet Take 40 mg by mouth daily. 03/17/19   [provider]  azithromycin (ZITHROMAX Z-PAK) 250 MG tablet 1 tablet (250 mg) once daily on Days 2 through 5. 05/19/19   Earleen Newport, MD  busPIRone (BUSPAR) 10 MG tablet Take 10 mg by mouth 2 (two) times daily.    [provider]  chlorpheniramine-HYDROcodone (TUSSIONEX PENNKINETIC ER) 10-8 MG/5ML SUER Take 5 mLs by mouth 2 (two) times daily. 05/19/19   Earleen Newport, MD  cholecalciferol (VITAMIN D) 1000 units tablet Take 1,000 Units by mouth daily.    [provider]  cloNIDine (CATAPRES) 0.1 MG tablet Take 0.1 mg by mouth 2 (two) times daily as needed. 02/13/19   [provider]  diclofenac sodium (VOLTAREN) 1 % GEL Apply 2 g topically 4 (four) times daily. 02/01/19   [provider]  diltiazem (DILACOR XR) 120 MG 24 hr capsule Take 120 mg by mouth daily.    [provider]  escitalopram (LEXAPRO) 5 MG tablet Take 5 mg by mouth daily.    [provider]  esomeprazole (NEXIUM) 40 MG capsule Take 40 mg by mouth 2 (two) times daily before a meal.     [provider]  ferrous sulfate 325 (65 FE) MG tablet Take 325 mg by mouth daily with breakfast.    [provider]  Fluticasone-Salmeterol (ADVAIR) 250-50 MCG/DOSE AEPB Inhale 1 puff into the lungs 2 (two) times daily.    [provider]  hydrALAZINE (APRESOLINE) 10 MG tablet Take 5 tablets (50 mg total) by mouth 3 (three) times daily. 02/21/19   Fritzi Mandes, MD  levothyroxine (SYNTHROID, LEVOTHROID) 50 MCG tablet Take 50 mcg by mouth daily before breakfast.    [provider]  loratadine (CLARITIN) 10 MG tablet Take 10 mg by mouth daily.    [provider]  losartan (COZAAR) 50 MG tablet Take 2 tablets (100 mg total) by mouth at bedtime. 12/13/18   Loletha Grayer, MD  magnesium oxide (MAG-OX) 400 MG tablet Take 400 mg by mouth 2 (two) times daily.    [provider]  meclizine (ANTIVERT) 25 MG tablet Take 1 tablet (25  mg total) by mouth 2 (two) times daily as needed for dizziness. 09/16/18   Merlyn Lot, MD  meloxicam (MOBIC) 15 MG tablet Take 15 mg by mouth daily. 02/07/19   [provider]  mometasone-formoterol (DULERA) 100-5 MCG/ACT AERO Inhale 2 puffs into the lungs 2 (two) times daily.    [provider]  polyethylene glycol (MIRALAX / GLYCOLAX) 17 g packet Take 17 g by mouth daily. 01/03/19   Earleen Newport, MD  RABEprazole (ACIPHEX) 20 MG tablet Take 20 mg by mouth 2 (two) times daily. 03/23/19   [provider]    REVIEW OF SYSTEMS:  Review of Systems  Constitutional: Negative for chills, fever, malaise/fatigue and weight loss.  HENT: Negative for ear pain, hearing loss and tinnitus.   Eyes: Negative for blurred vision, double vision, pain and redness.  Respiratory: Positive for cough. Negative for hemoptysis and shortness of breath.   Cardiovascular: Negative for chest pain, palpitations, orthopnea and leg swelling.  Gastrointestinal: Negative for abdominal pain, constipation, diarrhea, nausea and vomiting.   Genitourinary: Negative for dysuria, frequency and hematuria.  Musculoskeletal: Negative for back pain, joint pain and neck pain.  Skin:       No acne, rash, or lesions  Neurological: Positive for weakness. Negative for dizziness, tremors and focal weakness.  Endo/Heme/Allergies: Negative for polydipsia. Does not bruise/bleed easily.  Psychiatric/Behavioral: Negative for depression. The patient is not nervous/anxious and does not have insomnia.      VITAL SIGNS:   Vitals:   05/31/19 2000 05/31/19 2030 05/31/19 2100 05/31/19 2137  BP: 133/67 113/72 138/77 (!) 141/89  Pulse: (!) 51 (!) 59 62 (!) 58  Resp: 16  14   Temp:      TempSrc:      SpO2: 94% 94% 94% 98%  Weight:      Height:       Wt Readings from Last 3 Encounters:  05/31/19 70.3 kg  05/19/19 70.3 kg  04/14/19 72.3 kg    PHYSICAL EXAMINATION:  Physical Exam  Vitals reviewed. Constitutional: She is oriented to person, place, and time. She appears well-developed and well-nourished. No distress.  HENT:  Head: Normocephalic and atraumatic.  Mouth/Throat: Oropharynx is clear and moist.  Eyes: Pupils are equal, round, and reactive to light. Conjunctivae and EOM are normal. No scleral icterus.  Neck: Normal range of motion. Neck supple. No JVD present. No thyromegaly present.  Cardiovascular: Normal rate, regular rhythm and intact distal pulses. Exam reveals no gallop and no friction rub.  No murmur heard. Respiratory: Effort normal and breath sounds normal. No respiratory distress. She has no wheezes. She has no rales.  GI: Soft. Bowel sounds are normal. She exhibits no distension. There is no abdominal tenderness.  Musculoskeletal: Normal range of motion.        General: No edema.     Comments: No arthritis, no gout  Lymphadenopathy:    She has no cervical adenopathy.  Neurological: She is alert and oriented to person, place, and time. No cranial nerve deficit.  No dysarthria, no aphasia  Skin: Skin is warm and dry.  No rash noted. No erythema.  Psychiatric: She has a normal mood and affect. Her behavior is normal. Judgment and thought content normal.    LABORATORY PANEL:   CBC Recent Labs  Lab 05/31/19 1831  WBC 9.5  HGB 13.0  HCT 38.3  PLT 204   ------------------------------------------------------------------------------------------------------------------  Chemistries  Recent Labs  Lab 05/31/19 1831  NA 125*  K 4.3  CL 89*  CO2 26  GLUCOSE 118*  BUN 20  CREATININE 1.03*  CALCIUM 9.2   ------------------------------------------------------------------------------------------------------------------  Cardiac Enzymes No results for input(s): TROPONINI in the last 168 hours. ------------------------------------------------------------------------------------------------------------------  RADIOLOGY:  Dg Chest 2 View  Result Date: 05/31/2019 CLINICAL DATA:  Shortness of breath EXAM: CHEST - 2 VIEW COMPARISON:  Chest radiograph dated 05/19/2019 FINDINGS: The heart is mildly enlarged. Vascular calcifications are seen in the aortic arch. There is minimal bibasilar atelectasis. There is no pleural effusion or pneumothorax. Degenerative changes are seen in the spine. A left shoulder arthroplasty is noted. IMPRESSION: 1. Minimal bibasilar atelectasis. No consolidation or effusion. 2. Mild cardiomegaly. 3. Aortic atherosclerosis. Aortic Atherosclerosis (ICD10-I70.0). Electronically Signed   By: Zerita Boers M.D.   On: 05/31/2019 17:19    EKG:   Orders placed or performed during the hospital encounter of 05/31/19  . ED EKG  . ED EKG    IMPRESSION AND PLAN:  Principal Problem:   Hyponatremia -patient states she has been fluid restricting at home, though she states she was told by her outpatient physician recently that she needed to drink a little more so as not to get dehydrated.  However, she states that she has not been excessive with her fluids.  Sodium is only 125 today, which is  not as bad as it has been on her prior admissions.  Clinically she looks a little bit on the dry side.  I will start by giving her gentle IV fluids tonight, recheck sodium in the morning. Active Problems:   Bronchitis -azithromycin, as needed supportive treatment   AF (paroxysmal atrial fibrillation) (HCC) -continue home meds   HTN (hypertension) -home dose antihypertensives   Hypothyroidism -Home dose thyroid replacement   Chart review performed and case discussed with ED provider. Labs, imaging and/or ECG reviewed by provider and discussed with patient/family. Management plans discussed with the patient and/or family.  COVID-19 status: Pending  DVT PROPHYLAXIS: SubQ lovenox   GI PROPHYLAXIS:  PPI   ADMISSION STATUS: Inpatient     CODE STATUS: Full Code Status History    Date Active Date Inactive Code Status Order ID Comments User Context   04/08/2019 2134 04/09/2019 1549 Full Code [id number]  Sela Hua, MD Inpatient   02/17/2019 1440 02/21/2019 2037 Full Code [id number]  Saundra Shelling, MD ED   12/11/2018 1041 12/13/2018 1437 Full Code [id number]  Loletha Grayer, MD ED   09/02/2018 1156 09/08/2018 1621 Full Code [id number]  Nicholes Mango, MD Inpatient   05/30/2018 1127 05/31/2018 1740 Full Code [id number]  Bettey Costa, MD Inpatient   11/15/2017 1829 11/21/2017 1821 Full Code [id number]  Vaughan Basta, MD Inpatient   05/16/2017 0041 05/17/2017 1824 Full Code [id number]  Quintella Baton, MD ED   03/07/2017 1446 03/09/2017 1509 Full Code [id number]  Idelle Crouch, MD Inpatient   01/19/2017 0837 01/21/2017 1415 Full Code [id number]  Harrie Foreman, MD Inpatient   12/18/2016 0718 12/22/2016 1947 Full Code [id number]  Harrie Foreman, MD Inpatient   Advance Care Planning Activity      TOTAL TIME TAKING CARE OF THIS PATIENT: 45 minutes.   This patient was evaluated in the context of the global COVID-19 pandemic, which necessitated consideration that the patient might be at risk for infection  with the SARS-CoV-2 virus that causes COVID-19. Institutional protocols and algorithms that pertain to the evaluation of patients at risk for COVID-19 are in a state of rapid change based on information released by regulatory bodies including the  CDC and federal and [employer]. These policies and algorithms were followed to the best of this provider's knowledge to date during the patient's care at this facility.  Ethlyn Daniels 05/31/2019, 10:09 PM  [facility]  Office  [phone]  CC: Primary care physician; [name], MD  Note:  This document was prepared using Dragon voice recognition software and may include unintentional dictation errors.

## 2019-05-31 NOTE — ED Notes (Signed)
This RN spoke with Dr. Quentin Cornwall regarding patient orders. VORB for DG chest and X-ray. No other orders. Upon this RN arrival back to triage  room, pt resting with eyes eyes, respirations even and unlabored.

## 2019-05-31 NOTE — ED Provider Notes (Signed)
[facility] Emergency Department Provider Note ____________________________________________   First MD Initiated Contact with Patient 05/31/19 1814     (approximate)  I have reviewed the triage vital signs and the nursing notes.  HISTORY  Chief Complaint Hypertension  HPI Janice Perkins is a 85 y.o. female presents for evaluation for high blood pressure and fatigue   Patient reports that she has been feeling tired for the last several days.  She reports is been ongoing since she was diagnosed with bronchitis.  She is not short of breath, but she is just felt a little bit shaky or fatigue, no focal symptoms or headache.  Denies other symptoms.  No pain or burning with urination.  Reports her breathing is better except she does continue does have a slight dry cough without shortness of breath.  Nonproductive.  Reports has been tested for COVID 19, including just a couple weeks ago for similar and it was negative  She felt shaky earlier, she reports she took 1 of her home prescribed Xanax and this helped provide some relief, but she continues to feel slightly fatigued.  She does report she is had issues with low sodium in the past.  Past Medical History:  Diagnosis Date  . Asthma   . Atrial fibrillation ([place])   . Breast cancer ([place]) 2011  . Bronchitis 04/2015  . Cancer [facility]) 2011   breast  . COPD (chronic obstructive pulmonary disease) ([place])   . Hypertension   . Shingles    10/2015    Patient Active Problem List   Diagnosis Date Noted  . Duodenum ulcer   . Melena   . GI bleed 02/17/2019  . Hypertensive encephalopathy 12/11/2018  . Hypoxia 05/30/2018  . Leukocytosis 04/05/2018  . Pneumonia 11/15/2017  . Pleural effusion 05/16/2017  . AF (paroxysmal atrial fibrillation) ([place]) 05/16/2017  . Breast cancer ([place]) 05/16/2017  . Dependence on nocturnal oxygen therapy 05/16/2017  . HTN (hypertension) 05/16/2017  . Generalized weakness 03/07/2017  .  Hyponatremia 03/07/2017  . Dehydration 03/07/2017  . UTI (urinary tract infection) 03/07/2017  . HCAP (healthcare-associated pneumonia) 01/19/2017  . AKI (acute kidney injury) ([place]) 12/18/2016  . Primary cancer of upper outer quadrant of right female breast ([place]) 04/07/2016  . Lumbar radiculopathy 03/23/2016  . Spinal stenosis, lumbar region, with neurogenic claudication 03/23/2016  . DDD (degenerative disc disease), lumbar 01/14/2015  . Lumbosacral facet joint syndrome 01/14/2015  . Sacroiliac joint dysfunction 01/14/2015  . Greater trochanteric bursitis 01/14/2015  . Bilateral occipital neuralgia 01/14/2015    Past Surgical History:  Procedure Laterality Date  . BREAST EXCISIONAL BIOPSY Right 11/29/13   two areas FAT NECROSIS  . BREAST EXCISIONAL BIOPSY Right 10/30/2009   lumpectomy - radiation  . COLONOSCOPY WITH PROPOFOL N/A 06/10/2018   Procedure: COLONOSCOPY WITH PROPOFOL;  Surgeon: Manya Silvas, MD;  Location[facility] ENDOSCOPY;  Service: Endoscopy;  Laterality: N/A;  . COLONOSCOPY WITH PROPOFOL N/A 02/20/2019   Procedure: COLONOSCOPY WITH PROPOFOL;  Surgeon: Lucilla Lame, MD;  Location: [facility] ENDOSCOPY;  Service: Endoscopy;  Laterality: N/A;  . ESOPHAGOGASTRODUODENOSCOPY (EGD) WITH PROPOFOL N/A 06/10/2018   Procedure: ESOPHAGOGASTRODUODENOSCOPY (EGD) WITH PROPOFOL;  Surgeon: Manya Silvas, MD;  Location: [facility] ENDOSCOPY;  Service: Endoscopy;  Laterality: N/A;  . ESOPHAGOGASTRODUODENOSCOPY (EGD) WITH PROPOFOL N/A 02/20/2019   Procedure: ESOPHAGOGASTRODUODENOSCOPY (EGD) WITH PROPOFOL;  Surgeon: Lucilla Lame, MD;  Location: [facility] ENDOSCOPY;  Service: Endoscopy;  Laterality: N/A;  . NASAL SINUS SURGERY      Prior to Admission  medications   Medication Sig Start Date End Date Taking? Authorizing Provider  albuterol (ACCUNEB) 1.25 MG/3ML nebulizer solution Inhale 3 mLs into the lungs every 6 (six) hours as needed for wheezing.     [provider]  albuterol (PROVENTIL  HFA;VENTOLIN HFA) 108 (90 Base) MCG/ACT inhaler Inhale 2 puffs into the lungs every 6 (six) hours as needed for wheezing or shortness of breath.     [provider]  ALPRAZolam Duanne Moron) 0.5 MG tablet Take 0.25-0.5 mg by mouth 2 (two) times a day. Take 0.25 mg in the morning, then 0.5 mg at night    [provider]  atorvastatin (LIPITOR) 40 MG tablet Take 40 mg by mouth daily. 03/17/19   [provider]  azithromycin (ZITHROMAX Z-PAK) 250 MG tablet 1 tablet (250 mg) once daily on Days 2 through 5. 05/19/19   Earleen Newport, MD  busPIRone (BUSPAR) 10 MG tablet Take 10 mg by mouth 2 (two) times daily.    [provider]  chlorpheniramine-HYDROcodone (TUSSIONEX PENNKINETIC ER) 10-8 MG/5ML SUER Take 5 mLs by mouth 2 (two) times daily. 05/19/19   Earleen Newport, MD  cholecalciferol (VITAMIN D) 1000 units tablet Take 1,000 Units by mouth daily.    [provider]  cloNIDine (CATAPRES) 0.1 MG tablet Take 0.1 mg by mouth 2 (two) times daily as needed. 02/13/19   [provider]  diclofenac sodium (VOLTAREN) 1 % GEL Apply 2 g topically 4 (four) times daily. 02/01/19   [provider]  diltiazem (DILACOR XR) 120 MG 24 hr capsule Take 120 mg by mouth daily.    [provider]  escitalopram (LEXAPRO) 5 MG tablet Take 5 mg by mouth daily.    [provider]  esomeprazole (NEXIUM) 40 MG capsule Take 40 mg by mouth 2 (two) times daily before a meal.     [provider]  ferrous sulfate 325 (65 FE) MG tablet Take 325 mg by mouth daily with breakfast.    [provider]  Fluticasone-Salmeterol (ADVAIR) 250-50 MCG/DOSE AEPB Inhale 1 puff into the lungs 2 (two) times daily.    [provider]  hydrALAZINE (APRESOLINE) 10 MG tablet Take 5 tablets (50 mg total) by mouth 3 (three) times daily. 02/21/19   Fritzi Mandes, MD  levothyroxine (SYNTHROID, LEVOTHROID) 50 MCG tablet Take 50 mcg by mouth daily before  breakfast.    [provider]  loratadine (CLARITIN) 10 MG tablet Take 10 mg by mouth daily.    [provider]  losartan (COZAAR) 50 MG tablet Take 2 tablets (100 mg total) by mouth at bedtime. 12/13/18   Loletha Grayer, MD  magnesium oxide (MAG-OX) 400 MG tablet Take 400 mg by mouth 2 (two) times daily.    [provider]  meclizine (ANTIVERT) 25 MG tablet Take 1 tablet (25 mg total) by mouth 2 (two) times daily as needed for dizziness. 09/16/18   Merlyn Lot, MD  meloxicam (MOBIC) 15 MG tablet Take 15 mg by mouth daily. 02/07/19   [provider]  mometasone-formoterol (DULERA) 100-5 MCG/ACT AERO Inhale 2 puffs into the lungs 2 (two) times daily.    [provider]  polyethylene glycol (MIRALAX / GLYCOLAX) 17 g packet Take 17 g by mouth daily. 01/03/19   Earleen Newport, MD  RABEprazole (ACIPHEX) 20 MG tablet Take 20 mg by mouth 2 (two) times daily. 03/23/19   [provider]    Allergies Diphenhydramine hcl, Penicillins, Captopril, Eliquis [apixaban], Enalapril maleate, Tape,  Terfenadine, Augmentin [amoxicillin-pot clavulanate], and Biaxin [clarithromycin]  Family History  Problem Relation Age of Onset  . Hypertension Mother   . Heart disease Mother   . Cancer Mother   . Stroke Father   . Hypertension Father   . Breast cancer Sister     Social History Social History   Tobacco Use  . Smoking status: Never Smoker  . Smokeless tobacco: Never Used  Substance Use Topics  . Alcohol use: No    Alcohol/week: 0.0 standard drinks  . Drug use: No    Review of Systems Constitutional: No fever/chills.  Fatigue.  Eyes: No visual changes. ENT: No sore throat. Cardiovascular: Denies chest pain. Respiratory: Denies shortness of breath.  Some slight dry cough for about 2 weeks. Gastrointestinal: No abdominal pain.   Genitourinary: Negative for dysuria. Musculoskeletal: Negative for back pain. Skin: Negative for rash.  Neurological: Negative for headaches, areas of focal weakness or numbness.    ____________________________________________   PHYSICAL EXAM:  VITAL SIGNS: ED Triage Vitals  Enc Vitals Group     BP 05/31/19 1643 (!) 159/74     Pulse Rate 05/31/19 1643 66     Resp 05/31/19 1643 18     Temp 05/31/19 1643 (!) 97.5 F (36.4 C)     Temp Source 05/31/19 1643 Oral     SpO2 05/31/19 1643 99 %     Weight 05/31/19 1644 155 lb (70.3 kg)     Height 05/31/19 1644 5' (1.524 m)     Head Circumference --      Peak Flow --      Pain Score 05/31/19 1644 5     Pain Loc --      Pain Edu? --      Excl. in [place]? --     Constitutional: Alert and oriented. Well appearing and in no acute distress. Eyes: Conjunctivae are normal. Head: Atraumatic. Nose: No congestion/rhinnorhea. Mouth/Throat: Mucous membranes are moist. Neck: No stridor.  Cardiovascular: Normal rate, regular rhythm. Grossly normal heart sounds.  Good peripheral circulation. Respiratory: Normal respiratory effort.  No retractions. Lungs CTAB. Gastrointestinal: Soft and nontender. No distention. Musculoskeletal: No lower extremity tenderness nor edema. Neurologic:  Normal speech and language. No gross focal neurologic deficits are appreciated.  Skin:  Skin is warm, dry and intact. No rash noted. Psychiatric: Mood and affect are normal. Speech and behavior are normal.  ____________________________________________   LABS (all labs ordered are listed, but only abnormal results are displayed)  Labs Reviewed  BASIC METABOLIC PANEL - Abnormal; Notable for the following components:      Result Value   Sodium 125 (*)    Chloride 89 (*)    Glucose, Bld 118 (*)    Creatinine, Ser 1.03 (*)    GFR calc non Af Amer 50 (*)    GFR calc Af Amer 58 (*)    All other components within normal limits  URINALYSIS, COMPLETE (UACMP) WITH MICROSCOPIC - Abnormal; Notable for the following components:   Color, Urine STRAW (*)    APPearance CLEAR  (*)    Leukocytes,Ua SMALL (*)    Bacteria, UA RARE (*)    All other components within normal limits  SARS CORONAVIRUS 2 (TAT 6-24 HRS)  CBC   ____________________________________________  EKG  Reviewed entered by me at 1700 Heart rate 60 QRS 90 QTc 400 A. fib, occasional PVC.  No evidence acute ischemia ____________________________________________  RADIOLOGY  Dg Chest 2 View  Result Date: 05/31/2019 CLINICAL DATA:  Shortness of  breath EXAM: CHEST - 2 VIEW COMPARISON:  Chest radiograph dated 05/19/2019 FINDINGS: The heart is mildly enlarged. Vascular calcifications are seen in the aortic arch. There is minimal bibasilar atelectasis. There is no pleural effusion or pneumothorax. Degenerative changes are seen in the spine. A left shoulder arthroplasty is noted. IMPRESSION: 1. Minimal bibasilar atelectasis. No consolidation or effusion. 2. Mild cardiomegaly. 3. Aortic atherosclerosis. Aortic Atherosclerosis (ICD10-I70.0). Electronically Signed   By: Zerita Boers M.D.   On: 05/31/2019 17:19    Imaging reviewed negative for acute changes ____________________________________________   PROCEDURES  Procedure(s) performed: None  Procedures  Critical Care performed: No  ____________________________________________   INITIAL IMPRESSION / ASSESSMENT AND PLAN / ED COURSE  Pertinent labs & imaging results that were available during my care of the patient were reviewed by me and considered in my medical decision making (see chart for details).   Patient presents for fatigue, in the setting of recent bronchitis.  Overall reassuring examination does complain of generalized weakness however it is noted to have slightly reduced GFR as well as sodium level 125.  Clinically, appears likely low risk for COVID-19 with active symptoms, will test however.  Patient alert, oriented, generalized fatigue in the setting of reducing sodium levels now up to a critical 125 level discussed with the  patient will admit.  Discussed with hospitalist, will start slow rate low normal saline infusion and monitor closely for improvement.  Discussed with Dr. Jannifer Franklin      ____________________________________________   FINAL CLINICAL IMPRESSION(S) / ED DIAGNOSES  Final diagnoses:  Generalized weakness  Hyponatremia  Bronchitis        Note:  This document was prepared using Dragon voice recognition software and may include unintentional dictation errors       Delman Kitten, MD 05/31/19 2129

## 2019-05-31 NOTE — ED Triage Notes (Signed)
Pt presents to ED via POV with c/o high blood pressure. Pt states dx with bronchitis, prescribed a Z-pack at this time. Pt states took a clonidine PTA. Pt also c/o feeling shakey. Pt noted to be hoarse upon arrival, however per triage notes on 10/2, pt hoarse on 10/2. Pt speaking in complete sentences without difficulty. Pt states took Xanax this morning. Pt also c/o feeling anxious at this time. Pt also c/o back pain, states had epidural to her back approx 2 weeks ago.

## 2019-06-01 LAB — BASIC METABOLIC PANEL
Anion gap: 9 (ref 5–15)
BUN: 16 mg/dL (ref 8–23)
CO2: 27 mmol/L (ref 22–32)
Calcium: 8.9 mg/dL (ref 8.9–10.3)
Chloride: 95 mmol/L — ABNORMAL LOW (ref 98–111)
Creatinine, Ser: 0.92 mg/dL (ref 0.44–1.00)
GFR calc Af Amer: >60 mL/min (ref >60)
GFR calc non Af Amer: 57 mL/min — ABNORMAL LOW (ref >60)
Glucose, Bld: 103 mg/dL — ABNORMAL HIGH (ref 70–99)
Potassium: 4.4 mmol/L (ref 3.5–5.1)
Sodium: 131 mmol/L — ABNORMAL LOW (ref 135–145)

## 2019-06-01 LAB — CBC
HCT: 38.2 % (ref 36.0–46.0)
Hemoglobin: 12.9 g/dL (ref 12.0–15.0)
MCH: 29.4 pg (ref 26.0–34.0)
MCHC: 33.8 g/dL (ref 30.0–36.0)
MCV: 87 fL (ref 80.0–100.0)
Platelets: 211 10*3/uL (ref 150–400)
RBC: 4.39 MIL/uL (ref 3.87–5.11)
RDW: 14.2 % (ref 11.5–15.5)
WBC: 8.2 10*3/uL (ref 4.0–10.5)
nRBC: 0 % (ref 0.0–0.2)

## 2019-06-01 LAB — SARS CORONAVIRUS 2 (TAT 6-24 HRS): SARS Coronavirus 2: NEGATIVE

## 2019-06-01 MED ORDER — AZITHROMYCIN 250 MG PO TABS: 500.0000 mg | ORAL_TABLET | Freq: Every day | ORAL | 0 refills | Status: DC

## 2019-06-01 MED ORDER — ESCITALOPRAM OXALATE 10 MG PO TABS
5.0000 mg | ORAL_TABLET | Freq: Every day | ORAL | Status: DC
  Administered 2019-06-01: 5 mg via ORAL
  Filled 2019-06-01: qty 0.5

## 2019-06-01 MED ORDER — TRAMADOL HCL 50 MG PO TABS
50.0000 mg | ORAL_TABLET | Freq: Once | ORAL | Status: AC
  Administered 2019-06-01: 50 mg via ORAL
  Filled 2019-06-01: qty 1

## 2019-06-01 MED ORDER — DILTIAZEM HCL ER COATED BEADS 180 MG PO CP24
180.0000 mg | ORAL_CAPSULE | Freq: Every day | ORAL | Status: DC
  Administered 2019-06-01: 180 mg via ORAL
  Filled 2019-06-01: qty 1

## 2019-06-01 MED ORDER — BUSPIRONE HCL 10 MG PO TABS
10.0000 mg | ORAL_TABLET | Freq: Two times a day (BID) | ORAL | Status: DC
  Administered 2019-06-01: 10 mg via ORAL
  Filled 2019-06-01 (×2): qty 1

## 2019-06-01 MED ORDER — LABETALOL HCL 5 MG/ML IV SOLN
10.0000 mg | Freq: Four times a day (QID) | INTRAVENOUS | Status: DC | PRN
  Administered 2019-06-01: 10 mg via INTRAVENOUS
  Filled 2019-06-01: qty 4

## 2019-06-01 MED ORDER — ATORVASTATIN CALCIUM 20 MG PO TABS
40.0000 mg | ORAL_TABLET | Freq: Every day | ORAL | Status: DC
  Administered 2019-06-01: 40 mg via ORAL
  Filled 2019-06-01: qty 2

## 2019-06-01 MED ORDER — LORAZEPAM 2 MG/ML IJ SOLN: 1.0000 mg | INTRAMUSCULAR | Status: DC | PRN

## 2019-06-01 MED ORDER — HYDRALAZINE HCL 50 MG PO TABS
50.0000 mg | ORAL_TABLET | Freq: Three times a day (TID) | ORAL | Status: DC
  Administered 2019-06-01: 50 mg via ORAL
  Filled 2019-06-01: qty 1

## 2019-06-01 NOTE — Discharge Summary (Signed)
[facility] NAME: Janice Perkins    MR#:  [id number]  DATE OF BIRTH:  1932-09-23  DATE OF ADMISSION:  05/31/2019 ADMITTING PHYSICIAN: Lance Coon, MD  DATE OF DISCHARGE: 06/01/2019  PRIMARY CARE PHYSICIAN: [name], MD    ADMISSION DIAGNOSIS:  Hyponatremia [E87.1] Bronchitis [J40] Generalized weakness [R53.1]  DISCHARGE DIAGNOSIS:  Principal Problem:   Hyponatremia Active Problems:   AF (paroxysmal atrial fibrillation) (HCC)   HTN (hypertension)   Bronchitis   Hypothyroidism   SECONDARY DIAGNOSIS:   Past Medical History:  Diagnosis Date  . Asthma   . Atrial fibrillation ([place])   . Breast cancer ([place]) 2011  . Bronchitis 04/2015  . Cancer [facility]) 2011   breast  . COPD (chronic obstructive pulmonary disease) ([place])   . Hypertension   . Shingles    10/2015    HOSPITAL COURSE:   85 year old female with history of hypertension who presented with generalized weakness and found to have low sodium.  1.  Hypovolemic hyponatremia in the setting of poor p.o. intake and low sodium intake: Patient sodium is improved with IV fluids.  Patient encouraged to eat some sodium in her diet.  2.  Hypertension: Patient will continue outpatient regimen and follow-up with her PCP  3.  Acute bronchitis: Patient will continue azithromycin.  4.  PAF: Continue diltiazem for heart rate control     DISCHARGE CONDITIONS AND DIET:  Stable regular diet  CONSULTS OBTAINED:    DRUG ALLERGIES:   Allergies  Allergen Reactions  . Diphenhydramine Hcl Rash  . Penicillins Hives    .Has patient had a PCN reaction causing immediate rash, facial/tongue/throat swelling, SOB or lightheadedness with hypotension: Unknown Has patient had a PCN reaction causing severe rash involving mucus membranes or skin necrosis: Unknown Has patient had a PCN reaction that required hospitalization: Unknown Has patient had a PCN reaction occurring within the  last 10 years: Unknown If all of the above answers are "NO", then may proceed with Cephalosporin use.   . Captopril Other (See Comments)  . Eliquis [Apixaban] Other (See Comments)    Stomach bleed  . Enalapril Maleate Other (See Comments)    Other reaction(s): Unknown  . Tape Itching  . Terfenadine Other (See Comments)  . Augmentin [Amoxicillin-Pot Clavulanate] Rash    Has patient had a PCN reaction causing immediate rash, facial/tongue/throat swelling, SOB or lightheadedness with hypotension: Unknown Has patient had a PCN reaction causing severe rash involving mucus membranes or skin necrosis: Unknown Has patient had a PCN reaction that required hospitalization: Unknown Has patient had a PCN reaction occurring within the last 10 years: Unknown If all of the above answers are "NO", then may proceed with Cephalosporin use.  . Biaxin [Clarithromycin] Rash, Other (See Comments) and Hives    Other reaction(s): Unknown    DISCHARGE MEDICATIONS:   Allergies as of 06/01/2019      Reactions   Diphenhydramine Hcl Rash   Penicillins Hives   .Has patient had a PCN reaction causing immediate rash, facial/tongue/throat swelling, SOB or lightheadedness with hypotension: Unknown Has patient had a PCN reaction causing severe rash involving mucus membranes or skin necrosis: Unknown Has patient had a PCN reaction that required hospitalization: Unknown Has patient had a PCN reaction occurring within the last 10 years: Unknown If all of the above answers are "NO", then may proceed with Cephalosporin use.   Captopril Other (See Comments)   Eliquis [apixaban] Other (See Comments)   Stomach  bleed   Enalapril Maleate Other (See Comments)   Other reaction(s): Unknown   Tape Itching   Terfenadine Other (See Comments)   Augmentin [amoxicillin-pot Clavulanate] Rash   Has patient had a PCN reaction causing immediate rash, facial/tongue/throat swelling, SOB or lightheadedness with hypotension: Unknown Has  patient had a PCN reaction causing severe rash involving mucus membranes or skin necrosis: Unknown Has patient had a PCN reaction that required hospitalization: Unknown Has patient had a PCN reaction occurring within the last 10 years: Unknown If all of the above answers are "NO", then may proceed with Cephalosporin use.   Biaxin [clarithromycin] Rash, Other (See Comments), Hives   Other reaction(s): Unknown      Medication List    STOP taking these medications   diltiazem 120 MG 24 hr capsule Commonly known as: DILACOR XR   famotidine 20 MG tablet Commonly known as: PEPCID   RABEprazole 20 MG tablet Commonly known as: ACIPHEX     TAKE these medications   albuterol 108 (90 Base) MCG/ACT inhaler Commonly known as: VENTOLIN HFA Inhale 2 puffs into the lungs every 6 (six) hours as needed for wheezing or shortness of breath.   albuterol 1.25 MG/3ML nebulizer solution Commonly known as: ACCUNEB Inhale 3 mLs into the lungs every 6 (six) hours as needed for wheezing.   ALPRAZolam 0.5 MG tablet Commonly known as: XANAX Take 0.25-0.5 mg by mouth 2 (two) times a day. Take 0.25 mg in the morning, then 0.5 mg at night   atorvastatin 40 MG tablet Commonly known as: LIPITOR Take 40 mg by mouth daily.   azithromycin 250 MG tablet Commonly known as: Zithromax Z-Pak Take 2 tablets (500 mg total) by mouth daily. What changed:   how much to take  how to take this  when to take this  additional instructions   busPIRone 10 MG tablet Commonly known as: BUSPAR Take 10 mg by mouth 2 (two) times daily.   chlorpheniramine-HYDROcodone 10-8 MG/5ML Suer Commonly known as: Tussionex Pennkinetic ER Take 5 mLs by mouth 2 (two) times daily.   cholecalciferol 1000 units tablet Commonly known as: VITAMIN D Take 1,000 Units by mouth daily.   cloNIDine 0.1 MG tablet Commonly known as: CATAPRES Take 0.1 mg by mouth 2 (two) times daily as needed.   diclofenac sodium 1 % Gel Commonly  known as: VOLTAREN Apply 2 g topically 4 (four) times daily.   diltiazem 180 MG 24 hr capsule Commonly known as: CARDIZEM CD Take 1 capsule by mouth daily.   escitalopram 5 MG tablet Commonly known as: LEXAPRO Take 5 mg by mouth daily.   esomeprazole 40 MG capsule Commonly known as: NEXIUM Take 40 mg by mouth 2 (two) times daily before a meal.   ferrous sulfate 325 (65 FE) MG tablet Take 325 mg by mouth daily with breakfast.   Fluticasone-Salmeterol 250-50 MCG/DOSE Aepb Commonly known as: ADVAIR Inhale 1 puff into the lungs 2 (two) times daily.   hydrALAZINE 10 MG tablet Commonly known as: APRESOLINE Take 5 tablets (50 mg total) by mouth 3 (three) times daily.   HYDROcodone-acetaminophen 5-325 MG tablet Commonly known as: NORCO/VICODIN Take 1 tablet by mouth 2 (two) times daily as needed.   levothyroxine 50 MCG tablet Commonly known as: SYNTHROID Take 50 mcg by mouth daily before breakfast.   loratadine 10 MG tablet Commonly known as: CLARITIN Take 10 mg by mouth daily.   losartan 50 MG tablet Commonly known as: COZAAR Take 2 tablets (100 mg total) by  mouth at bedtime.   magnesium oxide 400 MG tablet Commonly known as: MAG-OX Take 400 mg by mouth 2 (two) times daily.   meclizine 25 MG tablet Commonly known as: ANTIVERT Take 1 tablet (25 mg total) by mouth 2 (two) times daily as needed for dizziness.   meloxicam 15 MG tablet Commonly known as: MOBIC Take 15 mg by mouth daily.   mometasone-formoterol 100-5 MCG/ACT Aero Commonly known as: DULERA Inhale 2 puffs into the lungs 2 (two) times daily.   ondansetron 4 MG disintegrating tablet Commonly known as: ZOFRAN-ODT Take 1 tablet by mouth every 8 (eight) hours as needed.   polyethylene glycol 17 g packet Commonly known as: MIRALAX / GLYCOLAX Take 17 g by mouth daily.         Today   CHIEF COMPLAINT:  Doing well this am no issues   VITAL SIGNS:  Blood pressure 126/75, pulse (!) 57, temperature  98.1 F (36.7 C), temperature source Oral, resp. rate 20, height 5' (1.524 m), weight 70.3 kg, SpO2 99 %.   REVIEW OF SYSTEMS:  Review of Systems  Constitutional: Negative.  Negative for chills, fever and malaise/fatigue.  HENT: Negative.  Negative for ear discharge, ear pain, hearing loss, nosebleeds and sore throat.   Eyes: Negative.  Negative for blurred vision and pain.  Respiratory: Negative.  Negative for cough, hemoptysis, shortness of breath and wheezing.   Cardiovascular: Negative.  Negative for chest pain, palpitations and leg swelling.  Gastrointestinal: Negative.  Negative for abdominal pain, blood in stool, diarrhea, nausea and vomiting.  Genitourinary: Negative.  Negative for dysuria.  Musculoskeletal: Negative.  Negative for back pain.  Skin: Negative.   Neurological: Negative for dizziness, tremors, speech change, focal weakness, seizures and headaches.  Endo/Heme/Allergies: Negative.  Does not bruise/bleed easily.  Psychiatric/Behavioral: Negative.  Negative for depression, hallucinations and suicidal ideas.     PHYSICAL EXAMINATION:  GENERAL:  85 y.o.-year-old patient lying in the bed with no acute distress.  NECK:  Supple, no jugular venous distention. No thyroid enlargement, no tenderness.  LUNGS: Normal breath sounds bilaterally, no wheezing, rales,rhonchi  No use of accessory muscles of respiration.  CARDIOVASCULAR: S1, S2 normal. No murmurs, rubs, or gallops.  ABDOMEN: Soft, non-tender, non-distended. Bowel sounds present. No organomegaly or mass.  EXTREMITIES: No pedal edema, cyanosis, or clubbing.  PSYCHIATRIC: The patient is alert and oriented x 3.  SKIN: No obvious rash, lesion, or ulcer.   DATA REVIEW:   CBC Recent Labs  Lab 06/01/19 0454  WBC 8.2  HGB 12.9  HCT 38.2  PLT 211    Chemistries  Recent Labs  Lab 06/01/19 0454  NA 131*  K 4.4  CL 95*  CO2 27  GLUCOSE 103*  BUN 16  CREATININE 0.92  CALCIUM 8.9    Cardiac Enzymes No  results for input(s): TROPONINI in the last 168 hours.  Microbiology Results  @MICRORSLT48 @  RADIOLOGY:  Dg Chest 2 View  Result Date: 05/31/2019 CLINICAL DATA:  Shortness of breath EXAM: CHEST - 2 VIEW COMPARISON:  Chest radiograph dated 05/19/2019 FINDINGS: The heart is mildly enlarged. Vascular calcifications are seen in the aortic arch. There is minimal bibasilar atelectasis. There is no pleural effusion or pneumothorax. Degenerative changes are seen in the spine. A left shoulder arthroplasty is noted. IMPRESSION: 1. Minimal bibasilar atelectasis. No consolidation or effusion. 2. Mild cardiomegaly. 3. Aortic atherosclerosis. Aortic Atherosclerosis (ICD10-I70.0). Electronically Signed   By: Zerita Boers M.D.   On: 05/31/2019 17:19  Allergies as of 06/01/2019      Reactions   Diphenhydramine Hcl Rash   Penicillins Hives   .Has patient had a PCN reaction causing immediate rash, facial/tongue/throat swelling, SOB or lightheadedness with hypotension: Unknown Has patient had a PCN reaction causing severe rash involving mucus membranes or skin necrosis: Unknown Has patient had a PCN reaction that required hospitalization: Unknown Has patient had a PCN reaction occurring within the last 10 years: Unknown If all of the above answers are "NO", then may proceed with Cephalosporin use.   Captopril Other (See Comments)   Eliquis [apixaban] Other (See Comments)   Stomach bleed   Enalapril Maleate Other (See Comments)   Other reaction(s): Unknown   Tape Itching   Terfenadine Other (See Comments)   Augmentin [amoxicillin-pot Clavulanate] Rash   Has patient had a PCN reaction causing immediate rash, facial/tongue/throat swelling, SOB or lightheadedness with hypotension: Unknown Has patient had a PCN reaction causing severe rash involving mucus membranes or skin necrosis: Unknown Has patient had a PCN reaction that required hospitalization: Unknown Has patient had a PCN reaction occurring  within the last 10 years: Unknown If all of the above answers are "NO", then may proceed with Cephalosporin use.   Biaxin [clarithromycin] Rash, Other (See Comments), Hives   Other reaction(s): Unknown      Medication List    STOP taking these medications   diltiazem 120 MG 24 hr capsule Commonly known as: DILACOR XR   famotidine 20 MG tablet Commonly known as: PEPCID   RABEprazole 20 MG tablet Commonly known as: ACIPHEX     TAKE these medications   albuterol 108 (90 Base) MCG/ACT inhaler Commonly known as: VENTOLIN HFA Inhale 2 puffs into the lungs every 6 (six) hours as needed for wheezing or shortness of breath.   albuterol 1.25 MG/3ML nebulizer solution Commonly known as: ACCUNEB Inhale 3 mLs into the lungs every 6 (six) hours as needed for wheezing.   ALPRAZolam 0.5 MG tablet Commonly known as: XANAX Take 0.25-0.5 mg by mouth 2 (two) times a day. Take 0.25 mg in the morning, then 0.5 mg at night   atorvastatin 40 MG tablet Commonly known as: LIPITOR Take 40 mg by mouth daily.   azithromycin 250 MG tablet Commonly known as: Zithromax Z-Pak Take 2 tablets (500 mg total) by mouth daily. What changed:   how much to take  how to take this  when to take this  additional instructions   busPIRone 10 MG tablet Commonly known as: BUSPAR Take 10 mg by mouth 2 (two) times daily.   chlorpheniramine-HYDROcodone 10-8 MG/5ML Suer Commonly known as: Tussionex Pennkinetic ER Take 5 mLs by mouth 2 (two) times daily.   cholecalciferol 1000 units tablet Commonly known as: VITAMIN D Take 1,000 Units by mouth daily.   cloNIDine 0.1 MG tablet Commonly known as: CATAPRES Take 0.1 mg by mouth 2 (two) times daily as needed.   diclofenac sodium 1 % Gel Commonly known as: VOLTAREN Apply 2 g topically 4 (four) times daily.   diltiazem 180 MG 24 hr capsule Commonly known as: CARDIZEM CD Take 1 capsule by mouth daily.   escitalopram 5 MG tablet Commonly known as:  LEXAPRO Take 5 mg by mouth daily.   esomeprazole 40 MG capsule Commonly known as: NEXIUM Take 40 mg by mouth 2 (two) times daily before a meal.   ferrous sulfate 325 (65 FE) MG tablet Take 325 mg by mouth daily with breakfast.   Fluticasone-Salmeterol 250-50 MCG/DOSE Aepb Commonly  known as: ADVAIR Inhale 1 puff into the lungs 2 (two) times daily.   hydrALAZINE 10 MG tablet Commonly known as: APRESOLINE Take 5 tablets (50 mg total) by mouth 3 (three) times daily.   HYDROcodone-acetaminophen 5-325 MG tablet Commonly known as: NORCO/VICODIN Take 1 tablet by mouth 2 (two) times daily as needed.   levothyroxine 50 MCG tablet Commonly known as: SYNTHROID Take 50 mcg by mouth daily before breakfast.   loratadine 10 MG tablet Commonly known as: CLARITIN Take 10 mg by mouth daily.   losartan 50 MG tablet Commonly known as: COZAAR Take 2 tablets (100 mg total) by mouth at bedtime.   magnesium oxide 400 MG tablet Commonly known as: MAG-OX Take 400 mg by mouth 2 (two) times daily.   meclizine 25 MG tablet Commonly known as: ANTIVERT Take 1 tablet (25 mg total) by mouth 2 (two) times daily as needed for dizziness.   meloxicam 15 MG tablet Commonly known as: MOBIC Take 15 mg by mouth daily.   mometasone-formoterol 100-5 MCG/ACT Aero Commonly known as: DULERA Inhale 2 puffs into the lungs 2 (two) times daily.   ondansetron 4 MG disintegrating tablet Commonly known as: ZOFRAN-ODT Take 1 tablet by mouth every 8 (eight) hours as needed.   polyethylene glycol 17 g packet Commonly known as: MIRALAX / GLYCOLAX Take 17 g by mouth daily.         Management plans discussed with the patient and she is in agreement. Stable for discharge home  Patient should follow up with pcp  CODE STATUS:     Code Status Orders  (From admission, onward)         Start     Ordered   05/31/19 2318  Full code  Continuous     05/31/19 2317        Code Status History    Date Active  Date Inactive Code Status Order ID Comments User Context   04/08/2019 2134 04/09/2019 1549 Full Code [id number]  Sela Hua, MD Inpatient   02/17/2019 1440 02/21/2019 2037 Full Code [id number]  Saundra Shelling, MD ED   12/11/2018 1041 12/13/2018 1437 Full Code [id number]  Loletha Grayer, MD ED   09/02/2018 1156 09/08/2018 1621 Full Code [id number]  Nicholes Mango, MD Inpatient   05/30/2018 1127 05/31/2018 1740 Full Code [id number]  Bettey Costa, MD Inpatient   11/15/2017 1829 11/21/2017 1821 Full Code [id number]  Vaughan Basta, MD Inpatient   05/16/2017 0041 05/17/2017 1824 Full Code [id number]  Quintella Baton, MD ED   03/07/2017 1446 03/09/2017 1509 Full Code [id number]  Idelle Crouch, MD Inpatient   01/19/2017 0837 01/21/2017 1415 Full Code [id number]  Harrie Foreman, MD Inpatient   12/18/2016 0718 12/22/2016 1947 Full Code [id number]  Harrie Foreman, MD Inpatient   Advance Care Planning Activity      TOTAL TIME TAKING CARE OF THIS PATIENT: 38 minutes.    Note: This dictation was prepared with Dragon dictation along with smaller phrase technology. Any transcriptional errors that result from this process are unintentional.  Bettey Costa M.D on 06/01/2019 at 10:39 AM  Between 7am to 6pm - Pager - [phone] After 6pm go to [email or web address] - password [email or web address]  Office  [phone]  CC: Primary care physician; [name], MD

## 2019-06-01 NOTE — Progress Notes (Signed)
Janice Perkins to be D/C'd home per MD order.  Discussed prescriptions and follow up appointments with the patient. Prescriptions given to patient, medication list explained in detail. Pt verbalized understanding.  Allergies as of 06/01/2019      Reactions   Diphenhydramine Hcl Rash   Penicillins Hives   .Has patient had a PCN reaction causing immediate rash, facial/tongue/throat swelling, SOB or lightheadedness with hypotension: Unknown Has patient had a PCN reaction causing severe rash involving mucus membranes or skin necrosis: Unknown Has patient had a PCN reaction that required hospitalization: Unknown Has patient had a PCN reaction occurring within the last 10 years: Unknown If all of the above answers are "NO", then may proceed with Cephalosporin use.   Captopril Other (See Comments)   Eliquis [apixaban] Other (See Comments)   Stomach bleed   Enalapril Maleate Other (See Comments)   Other reaction(s): Unknown   Tape Itching   Terfenadine Other (See Comments)   Augmentin [amoxicillin-pot Clavulanate] Rash   Has patient had a PCN reaction causing immediate rash, facial/tongue/throat swelling, SOB or lightheadedness with hypotension: Unknown Has patient had a PCN reaction causing severe rash involving mucus membranes or skin necrosis: Unknown Has patient had a PCN reaction that required hospitalization: Unknown Has patient had a PCN reaction occurring within the last 10 years: Unknown If all of the above answers are "NO", then may proceed with Cephalosporin use.   Biaxin [clarithromycin] Rash, Other (See Comments), Hives   Other reaction(s): Unknown      Medication List    STOP taking these medications   diltiazem 120 MG 24 hr capsule Commonly known as: DILACOR XR   famotidine 20 MG tablet Commonly known as: PEPCID   RABEprazole 20 MG tablet Commonly known as: ACIPHEX     TAKE these medications   albuterol 108 (90 Base) MCG/ACT inhaler Commonly known as: VENTOLIN  HFA Inhale 2 puffs into the lungs every 6 (six) hours as needed for wheezing or shortness of breath.   albuterol 1.25 MG/3ML nebulizer solution Commonly known as: ACCUNEB Inhale 3 mLs into the lungs every 6 (six) hours as needed for wheezing.   ALPRAZolam 0.5 MG tablet Commonly known as: XANAX Take 0.25-0.5 mg by mouth 2 (two) times a day. Take 0.25 mg in the morning, then 0.5 mg at night   atorvastatin 40 MG tablet Commonly known as: LIPITOR Take 40 mg by mouth daily.   azithromycin 250 MG tablet Commonly known as: Zithromax Z-Pak Take 2 tablets (500 mg total) by mouth daily. What changed:   how much to take  how to take this  when to take this  additional instructions   busPIRone 10 MG tablet Commonly known as: BUSPAR Take 10 mg by mouth 2 (two) times daily.   chlorpheniramine-HYDROcodone 10-8 MG/5ML Suer Commonly known as: Tussionex Pennkinetic ER Take 5 mLs by mouth 2 (two) times daily.   cholecalciferol 1000 units tablet Commonly known as: VITAMIN D Take 1,000 Units by mouth daily.   cloNIDine 0.1 MG tablet Commonly known as: CATAPRES Take 0.1 mg by mouth 2 (two) times daily as needed.   diclofenac sodium 1 % Gel Commonly known as: VOLTAREN Apply 2 g topically 4 (four) times daily.   diltiazem 180 MG 24 hr capsule Commonly known as: CARDIZEM CD Take 1 capsule by mouth daily.   escitalopram 5 MG tablet Commonly known as: LEXAPRO Take 5 mg by mouth daily.   esomeprazole 40 MG capsule Commonly known as: NEXIUM Take 40 mg by  mouth 2 (two) times daily before a meal.   ferrous sulfate 325 (65 FE) MG tablet Take 325 mg by mouth daily with breakfast.   Fluticasone-Salmeterol 250-50 MCG/DOSE Aepb Commonly known as: ADVAIR Inhale 1 puff into the lungs 2 (two) times daily.   hydrALAZINE 10 MG tablet Commonly known as: APRESOLINE Take 5 tablets (50 mg total) by mouth 3 (three) times daily.   HYDROcodone-acetaminophen 5-325 MG tablet Commonly known as:  NORCO/VICODIN Take 1 tablet by mouth 2 (two) times daily as needed.   levothyroxine 50 MCG tablet Commonly known as: SYNTHROID Take 50 mcg by mouth daily before breakfast.   loratadine 10 MG tablet Commonly known as: CLARITIN Take 10 mg by mouth daily.   losartan 50 MG tablet Commonly known as: COZAAR Take 2 tablets (100 mg total) by mouth at bedtime.   magnesium oxide 400 MG tablet Commonly known as: MAG-OX Take 400 mg by mouth 2 (two) times daily.   meclizine 25 MG tablet Commonly known as: ANTIVERT Take 1 tablet (25 mg total) by mouth 2 (two) times daily as needed for dizziness.   meloxicam 15 MG tablet Commonly known as: MOBIC Take 15 mg by mouth daily.   mometasone-formoterol 100-5 MCG/ACT Aero Commonly known as: DULERA Inhale 2 puffs into the lungs 2 (two) times daily.   ondansetron 4 MG disintegrating tablet Commonly known as: ZOFRAN-ODT Take 1 tablet by mouth every 8 (eight) hours as needed.   polyethylene glycol 17 g packet Commonly known as: MIRALAX / GLYCOLAX Take 17 g by mouth daily.       Vitals:   06/01/19 0954 06/01/19 1107  BP: 126/75 131/70  Pulse: (!) 57 65  Resp:  17  Temp:  98.3 F (36.8 C)  SpO2:  99%    Skin clean, dry and intact without evidence of skin break down, no evidence of skin tears noted. IV catheter discontinued intact. Site without signs and symptoms of complications. Dressing and pressure applied. Pt denies pain at this time. No complaints noted.  An After Visit Summary was printed and given to the patient. Patient escorted via [place], and D/C home via private auto.  Chuck Hint RN [facility] [employer]

## 2019-06-01 NOTE — Care Management CC44 (Signed)
Condition Code 44 Documentation Completed  Patient Details  Name: Janice Perkins MRN: [id number] Date of Birth: [birth year]   Condition Code 44 given:  Yes Patient signature on Condition Code 44 notice:  Yes Documentation of 2 MD's agreement:  Yes Code 44 added to claim:  Yes    Beverly Sessions, RN 06/01/2019, 12:04 PM

## 2019-06-01 NOTE — TOC Initial Note (Signed)
Transition of Care [facility]) - Initial/Assessment Note    Patient Details  Name: Janice Perkins MRN: [id number] Date of Birth: 10-06-1932  Transition of Care [facility]) CM/SW Contact:    Beverly Sessions, RN Phone Number: 06/01/2019, 1:30 PM  Clinical Narrative:                 Patient admitted from home with hyponatremia Patient lives at home with husband  Husband provides transportation and will pick up for discharge today.   Patient states that she is independent at baseline.  Does have a BSC, RW, and rollator in the home if needed, but does not use them.   PCP [facility].  Denies issues obtaining medications  No RNCM needs identified   Expected Discharge Plan: Home/Self Care Barriers to Discharge: Barriers Resolved   Patient Goals and CMS Choice        Expected Discharge Plan and Services Expected Discharge Plan: Home/Self Care       Living arrangements for the past 2 months: Single Family Home Expected Discharge Date: 06/01/19                                    Prior Living Arrangements/Services Living arrangements for the past 2 months: Single Family Home Lives with:: Spouse Patient language and need for interpreter reviewed:: Yes Do you feel safe going back to the place where you live?: Yes      Need for Family Participation in Patient Care: Yes (Comment) Care giver support system in place?: Yes (comment) Current home services: DME Criminal Activity/Legal Involvement Pertinent to Current Situation/Hospitalization: No - Comment as needed  Activities of Daily Living Home Assistive Devices/Equipment: None ADL Screening (condition at time of admission) Patient's cognitive ability adequate to safely complete daily activities?: Yes Is the patient deaf or have difficulty hearing?: No Does the patient have difficulty seeing, even when wearing glasses/contacts?: No Does the patient have difficulty concentrating, remembering, or making  decisions?: No Patient able to express need for assistance with ADLs?: Yes Does the patient have difficulty dressing or bathing?: No Independently performs ADLs?: Yes (appropriate for developmental age) Does the patient have difficulty walking or climbing stairs?: No Weakness of Legs: None Weakness of Arms/Hands: None  Permission Sought/Granted                  Emotional Assessment Appearance:: Appears stated age Attitude/Demeanor/Rapport: Gracious Affect (typically observed): Accepting Orientation: : Oriented to Self, Oriented to Place, Oriented to  Time, Oriented to Situation   Psych Involvement: No (comment)  Admission diagnosis:  Hyponatremia [E87.1] Bronchitis [J40] Generalized weakness [R53.1] Patient Active Problem List   Diagnosis Date Noted  . Bronchitis 05/31/2019  . Hypothyroidism 05/31/2019  . Duodenum ulcer   . Melena   . GI bleed 02/17/2019  . Hypertensive encephalopathy 12/11/2018  . Hypoxia 05/30/2018  . Leukocytosis 04/05/2018  . Pneumonia 11/15/2017  . Pleural effusion 05/16/2017  . AF (paroxysmal atrial fibrillation) ([place]) 05/16/2017  . Breast cancer ([place]) 05/16/2017  . Dependence on nocturnal oxygen therapy 05/16/2017  . HTN (hypertension) 05/16/2017  . Generalized weakness 03/07/2017  . Hyponatremia 03/07/2017  . Dehydration 03/07/2017  . UTI (urinary tract infection) 03/07/2017  . HCAP (healthcare-associated pneumonia) 01/19/2017  . AKI (acute kidney injury) ([place]) 12/18/2016  . Primary cancer of upper outer quadrant of right female breast ([place]) 04/07/2016  . Lumbar radiculopathy 03/23/2016  . Spinal  stenosis, lumbar region, with neurogenic claudication 03/23/2016  . DDD (degenerative disc disease), lumbar 01/14/2015  . Lumbosacral facet joint syndrome 01/14/2015  . Sacroiliac joint dysfunction 01/14/2015  . Greater trochanteric bursitis 01/14/2015  . Bilateral occipital neuralgia 01/14/2015   PCP:  [name], MD Pharmacy:    [place], [place] - [address] [address] Phone: [phone] Fax: [phone]     Social Determinants of Health (SDOH) Interventions    Readmission Risk Interventions Readmission Risk Prevention Plan 06/01/2019 04/09/2019 02/21/2019  Transportation Screening Complete Complete Complete  Medication Review (RN Care Manager) Complete Complete Complete  PCP or Specialist appointment within 3-5 days of discharge Complete Patient refused Not Complete  PCP/Specialist Appt Not Complete comments - - PCP appointment 7 days 7/14  [place] or [place] - Patient refused Patient refused  SW Recovery Care/Counseling Consult - Patient refused -  Palliative Care Screening Not Applicable Not Applicable Not [place] Not Applicable Not Applicable Not Applicable  Some recent data might be hidden

## 2019-06-01 NOTE — Care Management Obs Status (Signed)
[facility] NOTIFICATION   Patient Details  Name: Janice Perkins MRN: [id number] Date of Birth: Jul 28, 1933   Medicare Observation Status Notification Given:  Yes    Beverly Sessions, RN 06/01/2019, 12:04 PM

## 2019-06-04 ENCOUNTER — Inpatient Hospital Stay
Admission: EM | Admit: 2019-06-04 | Discharge: 2019-06-08 | DRG: 641 | Disposition: A | Discharge status: Home Health | Payer: Medicare Other | Attending: Internal Medicine | Admitting: Internal Medicine

## 2019-06-04 ENCOUNTER — Other Ambulatory Visit: Payer: Self-pay

## 2019-06-04 ENCOUNTER — Encounter: Payer: Self-pay | Admitting: Emergency Medicine

## 2019-06-04 DIAGNOSIS — Z20828 Contact with and (suspected) exposure to other viral communicable diseases: Secondary | ICD-10-CM | POA: Diagnosis present

## 2019-06-04 DIAGNOSIS — Z823 Family history of stroke: Secondary | ICD-10-CM

## 2019-06-04 DIAGNOSIS — E871 Hypo-osmolality and hyponatremia: Secondary | ICD-10-CM | POA: Diagnosis present

## 2019-06-04 DIAGNOSIS — Z853 Personal history of malignant neoplasm of breast: Secondary | ICD-10-CM | POA: Diagnosis not present

## 2019-06-04 DIAGNOSIS — R110 Nausea: Secondary | ICD-10-CM

## 2019-06-04 DIAGNOSIS — R609 Edema, unspecified: Secondary | ICD-10-CM

## 2019-06-04 DIAGNOSIS — Z881 Allergy status to other antibiotic agents status: Secondary | ICD-10-CM

## 2019-06-04 DIAGNOSIS — Z23 Encounter for immunization: Secondary | ICD-10-CM | POA: Diagnosis present

## 2019-06-04 DIAGNOSIS — E860 Dehydration: Secondary | ICD-10-CM | POA: Diagnosis present

## 2019-06-04 DIAGNOSIS — J449 Chronic obstructive pulmonary disease, unspecified: Secondary | ICD-10-CM | POA: Diagnosis present

## 2019-06-04 DIAGNOSIS — I10 Essential (primary) hypertension: Secondary | ICD-10-CM | POA: Diagnosis present

## 2019-06-04 DIAGNOSIS — Z803 Family history of malignant neoplasm of breast: Secondary | ICD-10-CM | POA: Diagnosis not present

## 2019-06-04 DIAGNOSIS — Z888 Allergy status to other drugs, medicaments and biological substances status: Secondary | ICD-10-CM

## 2019-06-04 DIAGNOSIS — Z880 Allergy status to penicillin: Secondary | ICD-10-CM

## 2019-06-04 DIAGNOSIS — E785 Hyperlipidemia, unspecified: Secondary | ICD-10-CM | POA: Diagnosis present

## 2019-06-04 DIAGNOSIS — Z8249 Family history of ischemic heart disease and other diseases of the circulatory system: Secondary | ICD-10-CM | POA: Diagnosis not present

## 2019-06-04 DIAGNOSIS — F329 Major depressive disorder, single episode, unspecified: Secondary | ICD-10-CM | POA: Diagnosis present

## 2019-06-04 DIAGNOSIS — I482 Chronic atrial fibrillation, unspecified: Secondary | ICD-10-CM | POA: Diagnosis present

## 2019-06-04 LAB — URINALYSIS, COMPLETE (UACMP) WITH MICROSCOPIC
Bacteria, UA: NONE SEEN
Bilirubin Urine: NEGATIVE
Glucose, UA: NEGATIVE mg/dL
Hgb urine dipstick: NEGATIVE
Ketones, ur: NEGATIVE mg/dL
Nitrite: NEGATIVE
Protein, ur: NEGATIVE mg/dL
Specific Gravity, Urine: 1.003 — ABNORMAL LOW (ref 1.005–1.030)
Squamous Epithelial / HPF: NONE SEEN (ref 0–5)
pH: 7 (ref 5.0–8.0)

## 2019-06-04 LAB — COMPREHENSIVE METABOLIC PANEL
ALT: 17 U/L (ref 0–44)
AST: 26 U/L (ref 15–41)
Albumin: 3.8 g/dL (ref 3.5–5.0)
Alkaline Phosphatase: 54 U/L (ref 38–126)
Anion gap: 10 (ref 5–15)
BUN: 19 mg/dL (ref 8–23)
CO2: 25 mmol/L (ref 22–32)
Calcium: 9 mg/dL (ref 8.9–10.3)
Chloride: 85 mmol/L — ABNORMAL LOW (ref 98–111)
Creatinine, Ser: 0.88 mg/dL (ref 0.44–1.00)
GFR calc Af Amer: >60 mL/min (ref >60)
GFR calc non Af Amer: >60 mL/min (ref >60)
Glucose, Bld: 86 mg/dL (ref 70–99)
Potassium: 4.8 mmol/L (ref 3.5–5.1)
Sodium: 120 mmol/L — ABNORMAL LOW (ref 135–145)
Total Bilirubin: 0.9 mg/dL (ref 0.3–1.2)
Total Protein: 7.2 g/dL (ref 6.5–8.1)

## 2019-06-04 LAB — CBC
HCT: 38.5 % (ref 36.0–46.0)
Hemoglobin: 13.2 g/dL (ref 12.0–15.0)
MCH: 29.3 pg (ref 26.0–34.0)
MCHC: 34.3 g/dL (ref 30.0–36.0)
MCV: 85.6 fL (ref 80.0–100.0)
Platelets: 232 10*3/uL (ref 150–400)
RBC: 4.5 MIL/uL (ref 3.87–5.11)
RDW: 14.1 % (ref 11.5–15.5)
WBC: 9.7 10*3/uL (ref 4.0–10.5)
nRBC: 0 % (ref 0.0–0.2)

## 2019-06-04 LAB — LIPASE, BLOOD: Lipase: 25 U/L (ref 11–51)

## 2019-06-04 LAB — SODIUM
Sodium: 123 mmol/L — ABNORMAL LOW (ref 135–145)
Sodium: 125 mmol/L — ABNORMAL LOW (ref 135–145)

## 2019-06-04 LAB — SARS CORONAVIRUS 2 (TAT 6-24 HRS): SARS Coronavirus 2: NEGATIVE

## 2019-06-04 MED ORDER — ACETAMINOPHEN 650 MG RE SUPP: 650.0000 mg | Freq: Four times a day (QID) | RECTAL | Status: DC | PRN

## 2019-06-04 MED ORDER — HYDRALAZINE HCL 50 MG PO TABS
50.0000 mg | ORAL_TABLET | Freq: Three times a day (TID) | ORAL | Status: DC
  Administered 2019-06-04 – 2019-06-07 (×10): 50 mg via ORAL
  Filled 2019-06-04 (×10): qty 1

## 2019-06-04 MED ORDER — ONDANSETRON HCL 4 MG/2ML IJ SOLN
4.0000 mg | Freq: Four times a day (QID) | INTRAMUSCULAR | Status: DC | PRN
  Administered 2019-06-06 – 2019-06-07 (×2): 4 mg via INTRAVENOUS
  Filled 2019-06-04 (×2): qty 2

## 2019-06-04 MED ORDER — ENOXAPARIN SODIUM 40 MG/0.4ML ~~LOC~~ SOLN
40.0000 mg | SUBCUTANEOUS | Status: DC
  Administered 2019-06-04 – 2019-06-07 (×4): 40 mg via SUBCUTANEOUS
  Filled 2019-06-04 (×4): qty 0.4

## 2019-06-04 MED ORDER — MOMETASONE FURO-FORMOTEROL FUM 200-5 MCG/ACT IN AERO
2.0000 | INHALATION_SPRAY | Freq: Two times a day (BID) | RESPIRATORY_TRACT | Status: DC
  Administered 2019-06-04 – 2019-06-08 (×8): 2 via RESPIRATORY_TRACT
  Filled 2019-06-04: qty 8.8

## 2019-06-04 MED ORDER — MELOXICAM 7.5 MG PO TABS
15.0000 mg | ORAL_TABLET | Freq: Every day | ORAL | Status: DC
  Administered 2019-06-05 – 2019-06-08 (×4): 15 mg via ORAL
  Filled 2019-06-04 (×5): qty 2

## 2019-06-04 MED ORDER — PANTOPRAZOLE SODIUM 40 MG PO TBEC
40.0000 mg | DELAYED_RELEASE_TABLET | Freq: Every day | ORAL | Status: DC
  Administered 2019-06-04 – 2019-06-08 (×5): 40 mg via ORAL
  Filled 2019-06-04 (×5): qty 1

## 2019-06-04 MED ORDER — MECLIZINE HCL 25 MG PO TABS
25.0000 mg | ORAL_TABLET | Freq: Two times a day (BID) | ORAL | Status: DC | PRN
  Filled 2019-06-04: qty 1

## 2019-06-04 MED ORDER — SODIUM CHLORIDE 0.9 % IV BOLUS
1000.0000 mL | Freq: Once | INTRAVENOUS | Status: AC
  Administered 2019-06-04: 1000 mL via INTRAVENOUS

## 2019-06-04 MED ORDER — DILTIAZEM HCL ER COATED BEADS 180 MG PO CP24
180.0000 mg | ORAL_CAPSULE | Freq: Every day | ORAL | Status: DC
  Administered 2019-06-05 – 2019-06-08 (×4): 180 mg via ORAL
  Filled 2019-06-04 (×4): qty 1

## 2019-06-04 MED ORDER — HYDROCODONE-ACETAMINOPHEN 5-325 MG PO TABS
1.0000 | ORAL_TABLET | Freq: Four times a day (QID) | ORAL | Status: DC | PRN
  Administered 2019-06-05 – 2019-06-08 (×5): 1 via ORAL
  Filled 2019-06-04 (×6): qty 1

## 2019-06-04 MED ORDER — VITAMIN D3 25 MCG (1000 UNIT) PO TABS
1000.0000 [IU] | ORAL_TABLET | Freq: Every day | ORAL | Status: DC
  Administered 2019-06-04 – 2019-06-08 (×5): 1000 [IU] via ORAL
  Filled 2019-06-04 (×9): qty 1

## 2019-06-04 MED ORDER — LEVOTHYROXINE SODIUM 50 MCG PO TABS
50.0000 ug | ORAL_TABLET | Freq: Every day | ORAL | Status: DC
  Administered 2019-06-05 – 2019-06-08 (×4): 50 ug via ORAL
  Filled 2019-06-04 (×4): qty 1

## 2019-06-04 MED ORDER — LORATADINE 10 MG PO TABS
10.0000 mg | ORAL_TABLET | Freq: Every day | ORAL | Status: DC
  Administered 2019-06-04 – 2019-06-07 (×4): 10 mg via ORAL
  Filled 2019-06-04 (×4): qty 1

## 2019-06-04 MED ORDER — ATORVASTATIN CALCIUM 20 MG PO TABS
40.0000 mg | ORAL_TABLET | Freq: Every day | ORAL | Status: DC
  Administered 2019-06-04 – 2019-06-07 (×4): 40 mg via ORAL
  Filled 2019-06-04: qty 1
  Filled 2019-06-04 (×3): qty 2
  Filled 2019-06-04: qty 4

## 2019-06-04 MED ORDER — ESCITALOPRAM OXALATE 10 MG PO TABS
5.0000 mg | ORAL_TABLET | Freq: Every day | ORAL | Status: DC
  Administered 2019-06-06 – 2019-06-08 (×3): 5 mg via ORAL
  Filled 2019-06-04 (×5): qty 0.5

## 2019-06-04 MED ORDER — LOSARTAN POTASSIUM 50 MG PO TABS
100.0000 mg | ORAL_TABLET | Freq: Every day | ORAL | Status: DC
  Administered 2019-06-04 – 2019-06-07 (×4): 100 mg via ORAL
  Filled 2019-06-04 (×4): qty 2

## 2019-06-04 MED ORDER — ONDANSETRON HCL 4 MG PO TABS
4.0000 mg | ORAL_TABLET | Freq: Four times a day (QID) | ORAL | Status: DC | PRN
  Administered 2019-06-08: 4 mg via ORAL
  Filled 2019-06-04: qty 1

## 2019-06-04 MED ORDER — SENNOSIDES-DOCUSATE SODIUM 8.6-50 MG PO TABS
1.0000 | ORAL_TABLET | Freq: Every evening | ORAL | Status: DC | PRN
  Administered 2019-06-07: 1 via ORAL
  Filled 2019-06-04 (×2): qty 1

## 2019-06-04 MED ORDER — MAGNESIUM OXIDE 400 (241.3 MG) MG PO TABS
400.0000 mg | ORAL_TABLET | Freq: Two times a day (BID) | ORAL | Status: DC
  Administered 2019-06-04 – 2019-06-08 (×8): 400 mg via ORAL
  Filled 2019-06-04 (×11): qty 1

## 2019-06-04 MED ORDER — ALBUTEROL SULFATE (2.5 MG/3ML) 0.083% IN NEBU: 3.0000 mL | INHALATION_SOLUTION | Freq: Four times a day (QID) | RESPIRATORY_TRACT | Status: DC | PRN

## 2019-06-04 MED ORDER — POLYETHYLENE GLYCOL 3350 17 G PO PACK
17.0000 g | PACK | Freq: Every day | ORAL | Status: DC
  Administered 2019-06-04 – 2019-06-08 (×5): 17 g via ORAL
  Filled 2019-06-04 (×5): qty 1

## 2019-06-04 MED ORDER — SODIUM CHLORIDE 0.9% FLUSH
3.0000 mL | Freq: Once | INTRAVENOUS | Status: AC
  Administered 2019-06-04: 3 mL via INTRAVENOUS

## 2019-06-04 MED ORDER — ACETAMINOPHEN 325 MG PO TABS: 650.0000 mg | ORAL_TABLET | Freq: Four times a day (QID) | ORAL | Status: DC | PRN

## 2019-06-04 MED ORDER — ALPRAZOLAM 0.25 MG PO TABS
0.2500 mg | ORAL_TABLET | Freq: Two times a day (BID) | ORAL | Status: DC | PRN
  Administered 2019-06-05 – 2019-06-06 (×2): 0.25 mg via ORAL
  Filled 2019-06-04 (×2): qty 1

## 2019-06-04 MED ORDER — SODIUM CHLORIDE 0.9 % IV SOLN
INTRAVENOUS | Status: DC
  Administered 2019-06-04 – 2019-06-08 (×5): via INTRAVENOUS

## 2019-06-04 MED ORDER — FERROUS SULFATE 325 (65 FE) MG PO TABS
325.0000 mg | ORAL_TABLET | Freq: Every day | ORAL | Status: DC
  Administered 2019-06-05 – 2019-06-07 (×3): 325 mg via ORAL
  Filled 2019-06-04 (×3): qty 1

## 2019-06-04 MED ORDER — BUSPIRONE HCL 10 MG PO TABS
10.0000 mg | ORAL_TABLET | Freq: Two times a day (BID) | ORAL | Status: DC
  Administered 2019-06-04 – 2019-06-08 (×8): 10 mg via ORAL
  Filled 2019-06-04 (×8): qty 1

## 2019-06-04 MED ORDER — HYDROCOD POLST-CPM POLST ER 10-8 MG/5ML PO SUER
5.0000 mL | Freq: Two times a day (BID) | ORAL | Status: DC
  Administered 2019-06-04 – 2019-06-07 (×6): 5 mL via ORAL
  Filled 2019-06-04 (×7): qty 5

## 2019-06-04 NOTE — Progress Notes (Signed)
New admit to room 136. Patient is alert and oriented. Ambulates independently to bathroom. Skin intact, no interruption in skin integrity.  IV of NS infusing at 86mls/hr . Patient requested ginger ale and saltines.   No sign or symptom of distress.

## 2019-06-04 NOTE — ED Provider Notes (Signed)
[facility] Emergency Department Provider Note  ____________________________________________  Time seen: Approximately 1:56 PM  I have reviewed the triage vital signs and the nursing notes.   HISTORY  Chief Complaint Nausea    HPI Janice Perkins is a 85 y.o. female who presents the emergency department for evaluation of nausea, weakness.  According to the patient, she was admitted to the hospital  4 days ago for hyponatremia.  Patient received IV fluids overnight, her sodium improved from 125 to 31 and she was discharged.  Patient followed up with her primary care and was started on Effexor for depression.  Patient reports that she took the medication, felt very nauseated and did not want to eat or drink anything apparently over the past 2 days.  Patient returns feeling more nauseated, feeling very weak and tired.  Patient denies any headache, vision changes, chest pain, shortness of breath, abdominal pain.  She has had significant nausea but no emesis.  No diarrhea or constipation.  No urinary symptoms.  On review of patient's medical records, patient has been admitted multiple times over the past several months for hyponatremia.  Patient did have an extended ICU stay at one point due to sodium level of 102.  Patient has been frequently in the mid to low 120s in regards to sodium several times.  It appears that patient frequently does not eat meals indicating poor dietary intake.        Past Medical History:  Diagnosis Date  . Asthma   . Atrial fibrillation ([place])   . Breast cancer ([place]) 2011  . Bronchitis 04/2015  . Cancer [facility]) 2011   breast  . COPD (chronic obstructive pulmonary disease) ([place])   . Hypertension   . Shingles    10/2015    Patient Active Problem List   Diagnosis Date Noted  . Bronchitis 05/31/2019  . Hypothyroidism 05/31/2019  . Duodenum ulcer   . Melena   . GI bleed 02/17/2019  . Hypertensive encephalopathy 12/11/2018  . Hypoxia  05/30/2018  . Leukocytosis 04/05/2018  . Pneumonia 11/15/2017  . Pleural effusion 05/16/2017  . AF (paroxysmal atrial fibrillation) ([place]) 05/16/2017  . Breast cancer ([place]) 05/16/2017  . Dependence on nocturnal oxygen therapy 05/16/2017  . HTN (hypertension) 05/16/2017  . Generalized weakness 03/07/2017  . Hyponatremia 03/07/2017  . Dehydration 03/07/2017  . UTI (urinary tract infection) 03/07/2017  . HCAP (healthcare-associated pneumonia) 01/19/2017  . AKI (acute kidney injury) ([place]) 12/18/2016  . Primary cancer of upper outer quadrant of right female breast ([place]) 04/07/2016  . Lumbar radiculopathy 03/23/2016  . Spinal stenosis, lumbar region, with neurogenic claudication 03/23/2016  . DDD (degenerative disc disease), lumbar 01/14/2015  . Lumbosacral facet joint syndrome 01/14/2015  . Sacroiliac joint dysfunction 01/14/2015  . Greater trochanteric bursitis 01/14/2015  . Bilateral occipital neuralgia 01/14/2015    Past Surgical History:  Procedure Laterality Date  . BREAST EXCISIONAL BIOPSY Right 11/29/13   two areas FAT NECROSIS  . BREAST EXCISIONAL BIOPSY Right 10/30/2009   lumpectomy - radiation  . COLONOSCOPY WITH PROPOFOL N/A 06/10/2018   Procedure: COLONOSCOPY WITH PROPOFOL;  Surgeon: Manya Silvas, MD;  Location[facility]COPY;  Service: Endoscopy;  Laterality: N/A;  . COLONOSCOPY WITH PROPOFOL N/A 02/20/2019   Procedure: COLONOSCOPY WITH PROPOFOL;  Surgeon: Lucilla Lame, MD;  Location[facility] ENDOSCOPY;  Service: Endoscopy;  Laterality: N/A;  . ESOPHAGOGASTRODUODENOSCOPY (EGD) WITH PROPOFOL N/A 06/10/2018   Procedure: ESOPHAGOGASTRODUODENOSCOPY (EGD) WITH PROPOFOL;  Surgeon: Manya Silvas, MD;  Location: [facility]  ENDOSCOPY;  Service: Endoscopy;  Laterality: N/A;  . ESOPHAGOGASTRODUODENOSCOPY (EGD) WITH PROPOFOL N/A 02/20/2019   Procedure: ESOPHAGOGASTRODUODENOSCOPY (EGD) WITH PROPOFOL;  Surgeon: Lucilla Lame, MD;  Location: [facility] ENDOSCOPY;  Service: Endoscopy;  Laterality:  N/A;  . NASAL SINUS SURGERY      Prior to Admission medications   Medication Sig Start Date End Date Taking? Authorizing Provider  albuterol (ACCUNEB) 1.25 MG/3ML nebulizer solution Inhale 3 mLs into the lungs every 6 (six) hours as needed for wheezing.    Yes [provider]  albuterol (PROVENTIL HFA;VENTOLIN HFA) 108 (90 Base) MCG/ACT inhaler Inhale 2 puffs into the lungs every 6 (six) hours as needed for wheezing or shortness of breath.    Yes [provider]  ALPRAZolam Duanne Moron) 0.5 MG tablet Take 0.5 mg by mouth 2 (two) times a day.    Yes [provider]  atorvastatin (LIPITOR) 40 MG tablet Take 40 mg by mouth daily. 03/17/19  Yes [provider]  azithromycin (ZITHROMAX Z-PAK) 250 MG tablet Take 2 tablets (500 mg total) by mouth daily. 06/01/19  Yes Mody, Ulice Bold, MD  busPIRone (BUSPAR) 10 MG tablet Take 10 mg by mouth 2 (two) times daily.   Yes [provider]  cholecalciferol (VITAMIN D) 1000 units tablet Take 1,000 Units by mouth daily.   Yes [provider]  cloNIDine (CATAPRES) 0.1 MG tablet Take 0.1 mg by mouth 2 (two) times daily as needed. 02/13/19  Yes [provider]  diltiazem (CARDIZEM CD) 180 MG 24 hr capsule Take 1 capsule by mouth daily. 04/07/19  Yes [provider]  ferrous sulfate 325 (65 FE) MG tablet Take 325 mg by mouth daily with breakfast.   Yes [provider]  Fluticasone-Salmeterol (ADVAIR) 250-50 MCG/DOSE AEPB Inhale 1 puff into the lungs 2 (two) times daily.   Yes [provider]  hydrALAZINE (APRESOLINE) 10 MG tablet Take 5 tablets (50 mg total) by mouth 3 (three) times daily. 02/21/19  Yes Fritzi Mandes, MD  HYDROcodone-acetaminophen (NORCO/VICODIN) 5-325 MG tablet Take 1 tablet by mouth 2 (two) times daily.  04/26/19  Yes [provider]  levothyroxine (SYNTHROID, LEVOTHROID) 50 MCG tablet Take 50 mcg by mouth daily before breakfast.   Yes [provider]   loratadine (CLARITIN) 10 MG tablet Take 10 mg by mouth daily.   Yes [provider]  losartan (COZAAR) 50 MG tablet Take 2 tablets (100 mg total) by mouth at bedtime. 12/13/18  Yes Wieting, Richard, MD  magnesium oxide (MAG-OX) 400 MG tablet Take 400 mg by mouth 2 (two) times daily.   Yes [provider]  meclizine (ANTIVERT) 25 MG tablet Take 1 tablet (25 mg total) by mouth 2 (two) times daily as needed for dizziness. 09/16/18  Yes Merlyn Lot, MD  meloxicam (MOBIC) 15 MG tablet Take 15 mg by mouth daily. 02/07/19  Yes [provider]  mometasone-formoterol (DULERA) 100-5 MCG/ACT AERO Inhale 2 puffs into the lungs 2 (two) times daily.   Yes [provider]  ondansetron (ZOFRAN-ODT) 4 MG disintegrating tablet Take 1 tablet by mouth every 8 (eight) hours as needed.  05/05/19  Yes [provider]  polyethylene glycol (MIRALAX / GLYCOLAX) 17 g packet Take 17 g by mouth daily. 01/03/19  Yes Earleen Newport, MD  RABEprazole (ACIPHEX) 20 MG tablet Take 20 mg by mouth 2 (two) times daily.   Yes [provider]  venlafaxine (EFFEXOR) 37.5 MG tablet Take 37.5 mg by mouth daily.   Yes [provider]  venlafaxine XR (EFFEXOR-XR) 75 MG 24 hr capsule Take 75 mg by mouth daily.   Yes [provider]    Allergies Diphenhydramine hcl, Penicillins, Captopril, Eliquis [apixaban], Enalapril maleate, Tape, Terfenadine, Augmentin [amoxicillin-pot clavulanate], and Biaxin [clarithromycin]  Family History  Problem Relation Age of Onset  . Hypertension Mother   . Heart disease Mother   . Cancer Mother   . Stroke Father   . Hypertension Father   . Breast cancer Sister     Social History Social History   Tobacco Use  . Smoking status: Never Smoker  . Smokeless tobacco: Never Used  Substance Use Topics  . Alcohol use: No    Alcohol/week: 0.0 standard drinks  . Drug use: No     Review of Systems  Constitutional: No  fever/chills.  Positive for weakness. Eyes: No visual changes. No discharge ENT: No upper respiratory complaints. Cardiovascular: no chest pain. Respiratory: no cough. No SOB. Gastrointestinal: No abdominal pain.  Positive nausea, no vomiting.  No diarrhea.  No constipation. Genitourinary: Negative for dysuria. No hematuria Musculoskeletal: Negative for musculoskeletal pain. Skin: Negative for rash, abrasions, lacerations, ecchymosis. Neurological: Negative for headaches, focal weakness or numbness. 10-point ROS otherwise negative.  ____________________________________________   PHYSICAL EXAM:  VITAL SIGNS: ED Triage Vitals  Enc Vitals Group     BP 06/04/19 1201 (!) 141/86     Pulse Rate 06/04/19 1201 77     Resp 06/04/19 1201 16     Temp 06/04/19 1201 98.5 F (36.9 C)     Temp Source 06/04/19 1201 Oral     SpO2 06/04/19 1201 98 %     Weight 06/04/19 1159 159 lb (72.1 kg)     Height 06/04/19 1159 5\' 2"  (1.575 m)     Head Circumference --      Peak Flow --      Pain Score 06/04/19 1159 0     Pain Loc --      Pain Edu? --      Excl. in [place]? --      Constitutional: Alert and oriented. Well appearing and in no acute distress. Eyes: Conjunctivae are normal. PERRL. EOMI. Head: Atraumatic. ENT:      Ears:       Nose: No congestion/rhinnorhea.      Mouth/Throat: Mucous membranes are moist.  Neck: No stridor.   Hematological/Lymphatic/Immunilogical: No cervical lymphadenopathy. Cardiovascular: Normal rate, regular rhythm. Normal S1 and S2.  Good peripheral circulation. Respiratory: Normal respiratory effort without tachypnea or retractions. Lungs CTAB. Good air entry to the bases with no decreased or absent breath sounds. Gastrointestinal: Bowel sounds 4 quadrants. Soft and nontender to palpation. No guarding or rigidity. No palpable masses. No distention. No CVA tenderness. Musculoskeletal: Full range of motion to all extremities. No gross deformities  appreciated. Neurologic:  Normal speech and language. No gross focal neurologic deficits are appreciated.  Skin:  Skin is warm, dry and intact. No rash noted. Psychiatric: Mood and affect are normal. Speech and behavior are normal. Patient exhibits appropriate insight and judgement.   ____________________________________________   LABS (all labs ordered are listed, but only abnormal results are displayed)  Labs Reviewed  COMPREHENSIVE METABOLIC PANEL - Abnormal; Notable for the following components:      Result Value   Sodium 120 (*)    Chloride 85 (*)    All other components within normal limits  URINALYSIS, COMPLETE (UACMP) WITH MICROSCOPIC - Abnormal; Notable for the following components:   Color, Urine STRAW (*)  APPearance CLEAR (*)    Specific Gravity, Urine 1.003 (*)    Leukocytes,Ua TRACE (*)    All other components within normal limits  SODIUM - Abnormal; Notable for the following components:   Sodium 123 (*)    All other components within normal limits  SARS CORONAVIRUS 2 (TAT 6-24 HRS)  LIPASE, BLOOD  CBC  SODIUM  SODIUM  CBC  BASIC METABOLIC PANEL  SODIUM   ____________________________________________  EKG   ____________________________________________  RADIOLOGY   No results found.  ____________________________________________    PROCEDURES  Procedure(s) performed:    Procedures    Medications  HYDROcodone-acetaminophen (NORCO/VICODIN) 5-325 MG per tablet 1 tablet (has no administration in time range)  meloxicam (MOBIC) tablet 15 mg (15 mg Oral Not Given 06/04/19 1646)  atorvastatin (LIPITOR) tablet 40 mg (40 mg Oral Given 06/04/19 1640)  diltiazem (CARDIZEM CD) 24 hr capsule 180 mg (has no administration in time range)  hydrALAZINE (APRESOLINE) tablet 50 mg (50 mg Oral Given 06/04/19 1640)  losartan (COZAAR) tablet 100 mg (has no administration in time range)  ALPRAZolam (XANAX) tablet 0.25 mg (has no administration in time range)   busPIRone (BUSPAR) tablet 10 mg (has no administration in time range)  escitalopram (LEXAPRO) tablet 5 mg (5 mg Oral Not Given 06/04/19 1645)  levothyroxine (SYNTHROID) tablet 50 mcg (has no administration in time range)  pantoprazole (PROTONIX) EC tablet 40 mg (40 mg Oral Given 06/04/19 1640)  magnesium oxide (MAG-OX) tablet 400 mg (has no administration in time range)  meclizine (ANTIVERT) tablet 25 mg (has no administration in time range)  polyethylene glycol (MIRALAX / GLYCOLAX) packet 17 g (17 g Oral Given 06/04/19 1646)  ferrous sulfate tablet 325 mg (has no administration in time range)  cholecalciferol (VITAMIN D) tablet 1,000 Units (1,000 Units Oral Given 06/04/19 1640)  albuterol (PROVENTIL) (2.5 MG/3ML) 0.083% nebulizer solution 3 mL (has no administration in time range)  chlorpheniramine-HYDROcodone (TUSSIONEX) 10-8 MG/5ML suspension 5 mL (has no administration in time range)  mometasone-formoterol (DULERA) 200-5 MCG/ACT inhaler 2 puff (has no administration in time range)  loratadine (CLARITIN) tablet 10 mg (10 mg Oral Given 06/04/19 1640)  enoxaparin (LOVENOX) injection 40 mg (has no administration in time range)  0.9 %  sodium chloride infusion ( Intravenous New Bag/Given 06/04/19 1645)  acetaminophen (TYLENOL) tablet 650 mg (has no administration in time range)    Or  acetaminophen (TYLENOL) suppository 650 mg (has no administration in time range)  senna-docusate (Senokot-S) tablet 1 tablet (has no administration in time range)  ondansetron (ZOFRAN) tablet 4 mg (has no administration in time range)    Or  ondansetron (ZOFRAN) injection 4 mg (has no administration in time range)  sodium chloride flush (NS) 0.9 % injection 3 mL (3 mLs Intravenous Given 06/04/19 1412)  sodium chloride 0.9 % bolus 1,000 mL (1,000 mLs Intravenous Transfusing/Transfer 06/04/19 1522)     ____________________________________________   INITIAL IMPRESSION / ASSESSMENT AND PLAN / ED  COURSE  Pertinent labs & imaging results that were available during my care of the patient were reviewed by me and considered in my medical decision making (see chart for details).  Review of the [place] CSRS was performed in accordance of the [place] prior to dispensing any controlled drugs.           Patient's diagnosis is consistent with hyponatremia, nausea.  Patient presented to the emergency department complaining of nausea times several days.  Patient was recently admitted for hyponatremia, discharged and follow-up with primary care.  Primary care placed the patient on Effexor for depression.  Patient reports that after starting this medication she has felt nauseated and been unable to eat for the past 2 days.  Patient's labs are concerning for hyponatremia with a sodium of 120.  Otherwise reassuring at this time.  Patient was started on IV fluids here in the emergency department and hospitalist was consulted for admission.  Hospitalist service agrees to admit the patient.  Patient care will be transferred to the hospital service at this time..   ____________________________________________  FINAL CLINICAL IMPRESSION(S) / ED DIAGNOSES  Final diagnoses:  Hyponatremia  Nausea      NEW MEDICATIONS STARTED DURING THIS VISIT:  ED Discharge Orders    None          This chart was dictated using voice recognition software/Dragon. Despite best efforts to proofread, errors can occur which can change the meaning. Any change was purely unintentional.    Darletta Moll, PA-C 06/04/19 2044    Lilia Pro., MD 06/05/19 [phone]

## 2019-06-04 NOTE — Progress Notes (Signed)
Advanced care plan.  Purpose of the Encounter: CODE STATUS  Parties in Attendance: Patient  Patient's Decision Capacity: Good  Subjective/Patient's story: 85 y.o. female with a known history of breast cancer, chronic atrial fibrillation, bronchitis, COPD, hypertension presented to the emergency room for nausea patient felt nauseous and was about to throw up.  She took some Zofran at home for comfort.  She was recently started on a new antidepressant pill by her primary care physician.  Patient was evaluated in the emergency room her sodium level is low around 120.  No confusion, seizures.  Patient has recently been at our hospital and has been tested for COVID-19 on 05/31/2019 and is been negative.  No complaints of chest pain, shortness of breath.  No fever chills and myalgias.   Objective/Medical story Patient needs IV fluid hydration monitoring of sodium levels closely.  Monitor for any neurological changes and check for seizures.   Goals of care determination:  Advance care directives goals of care treatment plan discussed Patient wants everything done which includes CPR, intubation ventilator if the need arises   CODE STATUS: Full code   Time spent discussing advanced care planning: 16 minutes

## 2019-06-04 NOTE — H&P (Signed)
[facility] [place] NAME: Janice Perkins    MR#:  [id number]  DATE OF BIRTH:  [birth year]  DATE OF ADMISSION:  06/04/2019  PRIMARY CARE PHYSICIAN: [name], MD   REQUESTING/REFERRING PHYSICIAN:   CHIEF COMPLAINT:   Chief Complaint  Patient presents with  . Nausea    HISTORY OF PRESENT ILLNESS: Janice Perkins  is a 85 y.o. female with a known history of breast cancer, chronic atrial fibrillation, bronchitis, COPD, hypertension presented to the emergency room for nausea patient felt nauseous and was about to throw up.  She took some Zofran at home for comfort.  She was recently started on a new antidepressant pill by her primary care physician.  Patient was evaluated in the emergency room her sodium level is low around 120.  No confusion, seizures.  Patient has recently been at our hospital and has been tested for COVID-19 on 05/31/2019 and is been negative.  No complaints of chest pain, shortness of breath.  No fever chills and myalgias.  PAST MEDICAL HISTORY:   Past Medical History:  Diagnosis Date  . Asthma   . Atrial fibrillation ([place])   . Breast cancer ([place]) 2011  . Bronchitis 04/2015  . Cancer [facility]) 2011   breast  . COPD (chronic obstructive pulmonary disease) ([place])   . Hypertension   . Shingles    10/2015    PAST SURGICAL HISTORY:  Past Surgical History:  Procedure Laterality Date  . BREAST EXCISIONAL BIOPSY Right 11/29/13   two areas FAT NECROSIS  . BREAST EXCISIONAL BIOPSY Right 10/30/2009   lumpectomy - radiation  . COLONOSCOPY WITH PROPOFOL N/A 06/10/2018   Procedure: COLONOSCOPY WITH PROPOFOL;  Surgeon: Manya Silvas, MD;  Location: [facility] ENDOSCOPY;  Service: Endoscopy;  Laterality: N/A;  . COLONOSCOPY WITH PROPOFOL N/A 02/20/2019   Procedure: COLONOSCOPY WITH PROPOFOL;  Surgeon: Lucilla Lame, MD;  Location: [facility] ENDOSCOPY;  Service: Endoscopy;  Laterality: N/A;  . ESOPHAGOGASTRODUODENOSCOPY (EGD) WITH  PROPOFOL N/A 06/10/2018   Procedure: ESOPHAGOGASTRODUODENOSCOPY (EGD) WITH PROPOFOL;  Surgeon: Manya Silvas, MD;  Location: [facility] ENDOSCOPY;  Service: Endoscopy;  Laterality: N/A;  . ESOPHAGOGASTRODUODENOSCOPY (EGD) WITH PROPOFOL N/A 02/20/2019   Procedure: ESOPHAGOGASTRODUODENOSCOPY (EGD) WITH PROPOFOL;  Surgeon: Lucilla Lame, MD;  Location: [facility] ENDOSCOPY;  Service: Endoscopy;  Laterality: N/A;  . NASAL SINUS SURGERY      SOCIAL HISTORY:  Social History   Tobacco Use  . Smoking status: Never Smoker  . Smokeless tobacco: Never Used  Substance Use Topics  . Alcohol use: No    Alcohol/week: 0.0 standard drinks    FAMILY HISTORY:  Family History  Problem Relation Age of Onset  . Hypertension Mother   . Heart disease Mother   . Cancer Mother   . Stroke Father   . Hypertension Father   . Breast cancer Sister     DRUG ALLERGIES:  Allergies  Allergen Reactions  . Diphenhydramine Hcl Rash  . Penicillins Hives    .Has patient had a PCN reaction causing immediate rash, facial/tongue/throat swelling, SOB or lightheadedness with hypotension: Unknown Has patient had a PCN reaction causing severe rash involving mucus membranes or skin necrosis: Unknown Has patient had a PCN reaction that required hospitalization: Unknown Has patient had a PCN reaction occurring within the last 10 years: Unknown If all of the above answers are "NO", then may proceed with Cephalosporin use.   . Captopril Other (See Comments)  . Eliquis [Apixaban] Other (See Comments)  Stomach bleed  . Enalapril Maleate Other (See Comments)    Other reaction(s): Unknown  . Tape Itching  . Terfenadine Other (See Comments)  . Augmentin [Amoxicillin-Pot Clavulanate] Rash    Has patient had a PCN reaction causing immediate rash, facial/tongue/throat swelling, SOB or lightheadedness with hypotension: Unknown Has patient had a PCN reaction causing severe rash involving mucus membranes or skin necrosis: Unknown Has  patient had a PCN reaction that required hospitalization: Unknown Has patient had a PCN reaction occurring within the last 10 years: Unknown If all of the above answers are "NO", then may proceed with Cephalosporin use.  . Biaxin [Clarithromycin] Rash, Other (See Comments) and Hives    Other reaction(s): Unknown    REVIEW OF SYSTEMS:   CONSTITUTIONAL: No fever, fatigue or weakness.  EYES: No blurred or double vision.  EARS, NOSE, AND THROAT: No tinnitus or ear pain.  RESPIRATORY: No cough, shortness of breath, wheezing or hemoptysis.  CARDIOVASCULAR: No chest pain, orthopnea, edema.  GASTROINTESTINAL: Has nausea, vomiting, no diarrhea or abdominal pain.  GENITOURINARY: No dysuria, hematuria.  ENDOCRINE: No polyuria, nocturia,  HEMATOLOGY: No anemia, easy bruising or bleeding SKIN: No rash or lesion. MUSCULOSKELETAL: No joint pain or arthritis.   NEUROLOGIC: No tingling, numbness, weakness.  PSYCHIATRY: No anxiety or depression.   MEDICATIONS AT HOME:  Prior to Admission medications   Medication Sig Start Date End Date Taking? Authorizing Provider  albuterol (ACCUNEB) 1.25 MG/3ML nebulizer solution Inhale 3 mLs into the lungs every 6 (six) hours as needed for wheezing.    Yes [provider]  albuterol (PROVENTIL HFA;VENTOLIN HFA) 108 (90 Base) MCG/ACT inhaler Inhale 2 puffs into the lungs every 6 (six) hours as needed for wheezing or shortness of breath.    Yes [provider]  ALPRAZolam Duanne Moron) 0.5 MG tablet Take 0.5 mg by mouth 2 (two) times a day.    Yes [provider]  atorvastatin (LIPITOR) 40 MG tablet Take 40 mg by mouth daily. 03/17/19  Yes [provider]  azithromycin (ZITHROMAX Z-PAK) 250 MG tablet Take 2 tablets (500 mg total) by mouth daily. 06/01/19   Bettey Costa, MD  busPIRone (BUSPAR) 10 MG tablet Take 10 mg by mouth 2 (two) times daily.    [provider]  chlorpheniramine-HYDROcodone (TUSSIONEX PENNKINETIC ER) 10-8 MG/5ML  SUER Take 5 mLs by mouth 2 (two) times daily. 05/19/19   Earleen Newport, MD  cholecalciferol (VITAMIN D) 1000 units tablet Take 1,000 Units by mouth daily.    [provider]  cloNIDine (CATAPRES) 0.1 MG tablet Take 0.1 mg by mouth 2 (two) times daily as needed. 02/13/19   [provider]  diclofenac sodium (VOLTAREN) 1 % GEL Apply 2 g topically 4 (four) times daily. 02/01/19   [provider]  diltiazem (CARDIZEM CD) 180 MG 24 hr capsule Take 1 capsule by mouth daily. 04/07/19   [provider]  escitalopram (LEXAPRO) 5 MG tablet Take 5 mg by mouth daily.    [provider]  esomeprazole (NEXIUM) 40 MG capsule Take 40 mg by mouth 2 (two) times daily before a meal.     [provider]  ferrous sulfate 325 (65 FE) MG tablet Take 325 mg by mouth daily with breakfast.    [provider]  Fluticasone-Salmeterol (ADVAIR) 250-50 MCG/DOSE AEPB Inhale 1 puff into the lungs 2 (two) times daily.    [provider]  hydrALAZINE (APRESOLINE) 10 MG tablet Take 5 tablets (50 mg total) by mouth 3 (  three) times daily. 02/21/19   Fritzi Mandes, MD  HYDROcodone-acetaminophen (NORCO/VICODIN) 5-325 MG tablet Take 1 tablet by mouth 2 (two) times daily as needed. 04/26/19   [provider]  levothyroxine (SYNTHROID, LEVOTHROID) 50 MCG tablet Take 50 mcg by mouth daily before breakfast.    [provider]  loratadine (CLARITIN) 10 MG tablet Take 10 mg by mouth daily.    [provider]  losartan (COZAAR) 50 MG tablet Take 2 tablets (100 mg total) by mouth at bedtime. 12/13/18   Loletha Grayer, MD  magnesium oxide (MAG-OX) 400 MG tablet Take 400 mg by mouth 2 (two) times daily.    [provider]  meclizine (ANTIVERT) 25 MG tablet Take 1 tablet (25 mg total) by mouth 2 (two) times daily as needed for dizziness. 09/16/18   Merlyn Lot, MD  meloxicam (MOBIC) 15 MG tablet Take 15 mg by mouth daily. 02/07/19   [provider]  mometasone-formoterol (DULERA) 100-5 MCG/ACT AERO Inhale 2 puffs into the lungs 2 (two) times daily.    [provider]  ondansetron (ZOFRAN-ODT) 4 MG disintegrating tablet Take 1 tablet by mouth every 8 (eight) hours as needed.  05/05/19   [provider]  polyethylene glycol (MIRALAX / GLYCOLAX) 17 g packet Take 17 g by mouth daily. 01/03/19   Earleen Newport, MD      PHYSICAL EXAMINATION:   VITAL SIGNS: Blood pressure (!) 143/79, pulse 65, temperature 98.5 F (36.9 C), temperature source Oral, resp. rate 16, height 5\' 2"  (1.575 m), weight 72.1 kg, SpO2 93 %.  GENERAL:  85 y.o.-year-old patient lying in the bed with no acute distress.  EYES: Pupils equal, round, reactive to light and accommodation. No scleral icterus. Extraocular muscles intact.  HEENT: Head atraumatic, normocephalic. Oropharynx dry and nasopharynx clear.  NECK:  Supple, no jugular venous distention. No thyroid enlargement, no tenderness.  LUNGS: Normal breath sounds bilaterally, no wheezing, rales,rhonchi or crepitation. No use of accessory muscles of respiration.  CARDIOVASCULAR: S1, S2 normal. No murmurs, rubs, or gallops.  ABDOMEN: Soft, nontender, nondistended. Bowel sounds present. No organomegaly or mass.  EXTREMITIES: No pedal edema, cyanosis, or clubbing.  NEUROLOGIC: Cranial nerves II through XII are intact. Muscle strength 5/5 in all extremities. Sensation intact. Gait not checked.  PSYCHIATRIC: The patient is alert and oriented x 3.  SKIN: No obvious rash, lesion, or ulcer.   LABORATORY PANEL:   CBC Recent Labs  Lab 05/31/19 1831 06/01/19 0454 06/04/19 1223  WBC 9.5 8.2 9.7  HGB 13.0 12.9 13.2  HCT 38.3 38.2 38.5  PLT 204 211 232  MCV 87.0 87.0 85.6  MCH 29.5 29.4 29.3  MCHC 33.9 33.8 34.3  RDW 13.9 14.2 14.1   ------------------------------------------------------------------------------------------------------------------  Chemistries  Recent Labs  Lab  05/31/19 1831 06/01/19 0454 06/04/19 1223  NA 125* 131* 120*  K 4.3 4.4 4.8  CL 89* 95* 85*  CO2 26 27 25   GLUCOSE 118* 103* 86  BUN 20 16 19   CREATININE 1.03* 0.92 0.88  CALCIUM 9.2 8.9 9.0  AST  --   --  26  ALT  --   --  17  ALKPHOS  --   --  54  BILITOT  --   --  0.9   ------------------------------------------------------------------------------------------------------------------ estimated creatinine clearance is 44.2 mL/min (by C-G formula based on SCr of 0.88 mg/dL). ------------------------------------------------------------------------------------------------------------------ No results for input(s): TSH, T4TOTAL, T3FREE, THYROIDAB in the last 72 hours.  Invalid input(s): FREET3   Coagulation profile No  results for input(s): INR, PROTIME in the last 168 hours. ------------------------------------------------------------------------------------------------------------------- No results for input(s): DDIMER in the last 72 hours. -------------------------------------------------------------------------------------------------------------------  Cardiac Enzymes No results for input(s): CKMB, TROPONINI, MYOGLOBIN in the last 168 hours.  Invalid input(s): CK ------------------------------------------------------------------------------------------------------------------ Invalid input(s): POCBNP  ---------------------------------------------------------------------------------------------------------------  Urinalysis    Component Value Date/Time   COLORURINE STRAW (A) 06/04/2019 1417   APPEARANCEUR CLEAR (A) 06/04/2019 1417   LABSPEC 1.003 (L) 06/04/2019 1417   PHURINE 7.0 06/04/2019 1417   GLUCOSEU NEGATIVE 06/04/2019 1417   HGBUR NEGATIVE 06/04/2019 [place] 06/04/2019 1417   KETONESUR NEGATIVE 06/04/2019 1417   PROTEINUR NEGATIVE 06/04/2019 1417   NITRITE NEGATIVE 06/04/2019 1417   LEUKOCYTESUR TRACE (A) 06/04/2019 1417      RADIOLOGY: No results found.  EKG: Orders placed or performed during the hospital encounter of 05/31/19  . ED EKG  . ED EKG  . EKG    IMPRESSION AND PLAN: 85 year old female patient with a known history of breast cancer, chronic atrial fibrillation, bronchitis, COPD, hypertension presented to the emergency room for nausea patient felt nauseous and was about to throw up.  -Acute hyponatremia Admit patient to medical floor IV hydration with normal saline Monitor sodium level every 4 hourly Monitor for neurological changes  -Dehydration Hydration with normal saline  -History of breast cancer Supportive care  -Hyperlipidemia Continue statin medication  -COPD Stable continue home dose inhalers  -DVT prophylaxis subcu Lovenox daily  All the records are reviewed and case discussed with ED provider. Management plans discussed with the patient, family and they are in agreement.  CODE STATUS:Full code Code Status History    Date Active Date Inactive Code Status Order ID Comments User Context   05/31/2019 2317 06/01/2019 1756 Full Code [id number]  Lance Coon, MD Inpatient   04/08/2019 2134 04/09/2019 1549 Full Code [id number]  Sela Hua, MD Inpatient   02/17/2019 1440 02/21/2019 2037 Full Code [id number]  Saundra Shelling, MD ED   12/11/2018 1041 12/13/2018 1437 Full Code [id number]  Loletha Grayer, MD ED   09/02/2018 1156 09/08/2018 1621 Full Code [id number]  Nicholes Mango, MD Inpatient   05/30/2018 1127 05/31/2018 1740 Full Code [id number]  Bettey Costa, MD Inpatient   11/15/2017 1829 11/21/2017 1821 Full Code [id number]  Vaughan Basta, MD Inpatient   05/16/2017 0041 05/17/2017 1824 Full Code [id number]  Quintella Baton, MD ED   03/07/2017 1446 03/09/2017 1509 Full Code [id number]  Idelle Crouch, MD Inpatient   01/19/2017 0837 01/21/2017 1415 Full Code [id number]  Harrie Foreman, MD Inpatient   12/18/2016 0718 12/22/2016 1947 Full Code [id number]  Harrie Foreman, MD Inpatient    Advance Care Planning Activity      TOTAL TIME TAKING CARE OF THIS PATIENT: 52 minutes.    Saundra Shelling M.D on 06/04/2019 at 3:12 PM  Between 7am to 6pm - Pager - [phone]  After 6pm go to [email or web address] - password [email or web address] [place] [facility]  Office  [phone]  CC: Primary care physician; [name], MD

## 2019-06-04 NOTE — ED Triage Notes (Signed)
Pt to ED via POV c/o nausea. Pt states that she was released from the hospital on 06/01/19. Pt states that she has been feeling nauseated since being home.Pt states that she took Zofran and it helped some but she still does not feel completely better. Pt denies vomiting. Pt states that she saw Dr. Ginette Pitman after being released from the hospital. Pt reports that he gave her a depression medication to help with her panic attacks, Pt states that after taking the first pill is when started to fell bad.  Pt is in NAD.

## 2019-06-05 ENCOUNTER — Inpatient Hospital Stay: Payer: Medicare Other

## 2019-06-05 ENCOUNTER — Other Ambulatory Visit: Payer: Self-pay

## 2019-06-05 LAB — BASIC METABOLIC PANEL
Anion gap: 10 (ref 5–15)
BUN: 14 mg/dL (ref 8–23)
CO2: 23 mmol/L (ref 22–32)
Calcium: 8.4 mg/dL — ABNORMAL LOW (ref 8.9–10.3)
Chloride: 94 mmol/L — ABNORMAL LOW (ref 98–111)
Creatinine, Ser: 0.85 mg/dL (ref 0.44–1.00)
GFR calc Af Amer: >60 mL/min (ref >60)
GFR calc non Af Amer: >60 mL/min (ref >60)
Glucose, Bld: 106 mg/dL — ABNORMAL HIGH (ref 70–99)
Potassium: 4.5 mmol/L (ref 3.5–5.1)
Sodium: 127 mmol/L — ABNORMAL LOW (ref 135–145)

## 2019-06-05 LAB — CBC
HCT: 34.4 % — ABNORMAL LOW (ref 36.0–46.0)
Hemoglobin: 12 g/dL (ref 12.0–15.0)
MCH: 29.8 pg (ref 26.0–34.0)
MCHC: 34.9 g/dL (ref 30.0–36.0)
MCV: 85.4 fL (ref 80.0–100.0)
Platelets: 183 10*3/uL (ref 150–400)
RBC: 4.03 MIL/uL (ref 3.87–5.11)
RDW: 14.2 % (ref 11.5–15.5)
WBC: 8 10*3/uL (ref 4.0–10.5)
nRBC: 0 % (ref 0.0–0.2)

## 2019-06-05 LAB — SODIUM
Sodium: 124 mmol/L — ABNORMAL LOW (ref 135–145)
Sodium: 125 mmol/L — ABNORMAL LOW (ref 135–145)
Sodium: 125 mmol/L — ABNORMAL LOW (ref 135–145)
Sodium: 125 mmol/L — ABNORMAL LOW (ref 135–145)

## 2019-06-05 LAB — TSH: TSH: 2.525 u[IU]/mL (ref 0.350–4.500)

## 2019-06-05 MED ORDER — METOPROLOL TARTRATE 5 MG/5ML IV SOLN
2.5000 mg | INTRAVENOUS | Status: DC | PRN
  Administered 2019-06-05: 2.5 mg via INTRAVENOUS
  Filled 2019-06-05: qty 5

## 2019-06-05 NOTE — Progress Notes (Signed)
[facility] [place] NAME: Janice Perkins    MR#:  [id number]  DATE OF BIRTH:  [birth year]  SUBJECTIVE:  CHIEF COMPLAINT:   Chief Complaint  Patient presents with  . Nausea   Came with nausea and noted to have low sodium.  She was recently admitted for hyponatremia and discharged 3 days ago. REVIEW OF SYSTEMS:  CONSTITUTIONAL: No fever, have fatigue or weakness.  EYES: No blurred or double vision.  EARS, NOSE, AND THROAT: No tinnitus or ear pain.  RESPIRATORY: No cough, shortness of breath, wheezing or hemoptysis.  CARDIOVASCULAR: No chest pain, orthopnea, edema.  GASTROINTESTINAL: No nausea, vomiting, diarrhea or abdominal pain.  GENITOURINARY: No dysuria, hematuria.  ENDOCRINE: No polyuria, nocturia,  HEMATOLOGY: No anemia, easy bruising or bleeding SKIN: No rash or lesion. MUSCULOSKELETAL: No joint pain or arthritis.   NEUROLOGIC: No tingling, numbness, weakness.  PSYCHIATRY: No anxiety or depression.   ROS  DRUG ALLERGIES:   Allergies  Allergen Reactions  . Diphenhydramine Hcl Rash  . Penicillins Hives    .Has patient had a PCN reaction causing immediate rash, facial/tongue/throat swelling, SOB or lightheadedness with hypotension: Unknown Has patient had a PCN reaction causing severe rash involving mucus membranes or skin necrosis: Unknown Has patient had a PCN reaction that required hospitalization: Unknown Has patient had a PCN reaction occurring within the last 10 years: Unknown If all of the above answers are "NO", then may proceed with Cephalosporin use.   . Captopril Other (See Comments)  . Eliquis [Apixaban] Other (See Comments)    Stomach bleed  . Enalapril Maleate Other (See Comments)    Other reaction(s): Unknown  . Tape Itching  . Terfenadine Other (See Comments)  . Augmentin [Amoxicillin-Pot Clavulanate] Rash    Has patient had a PCN reaction causing immediate rash, facial/tongue/throat swelling, SOB or  lightheadedness with hypotension: Unknown Has patient had a PCN reaction causing severe rash involving mucus membranes or skin necrosis: Unknown Has patient had a PCN reaction that required hospitalization: Unknown Has patient had a PCN reaction occurring within the last 10 years: Unknown If all of the above answers are "NO", then may proceed with Cephalosporin use.  . Biaxin [Clarithromycin] Rash, Other (See Comments) and Hives    Other reaction(s): Unknown    VITALS:  Blood pressure (!) 163/82, pulse 84, temperature 98.3 F (36.8 C), resp. rate 16, height 5' (1.524 m), weight 73.5 kg, SpO2 96 %.  PHYSICAL EXAMINATION:  GENERAL:  85 y.o.-year-old patient lying in the bed with no acute distress.  EYES: Pupils equal, round, reactive to light and accommodation. No scleral icterus. Extraocular muscles intact.  HEENT: Head atraumatic, normocephalic. Oropharynx and nasopharynx clear.  NECK:  Supple, no jugular venous distention. No thyroid enlargement, no tenderness.  LUNGS: Normal breath sounds bilaterally, no wheezing, rales,rhonchi or crepitation. No use of accessory muscles of respiration.  CARDIOVASCULAR: S1, S2 normal. No murmurs, rubs, or gallops.  ABDOMEN: Soft, nontender, nondistended. Bowel sounds present. No organomegaly or mass.  EXTREMITIES: No pedal edema, cyanosis, or clubbing.  NEUROLOGIC: Cranial nerves II through XII are intact. Muscle strength 4/5 in all extremities. Sensation intact. Gait not checked.  PSYCHIATRIC: The patient is alert and oriented x 3.  SKIN: No obvious rash, lesion, or ulcer.   Physical Exam LABORATORY PANEL:   CBC Recent Labs  Lab 06/05/19 0416  WBC 8.0  HGB 12.0  HCT 34.4*  PLT 183   ------------------------------------------------------------------------------------------------------------------  Chemistries  Recent Labs  Lab  06/04/19 1223  06/05/19 0416  06/05/19 1011  NA 120*   < > 127*   < > 125*  K 4.8  --  4.5  --   --   CL 85*   --  94*  --   --   CO2 25  --  23  --   --   GLUCOSE 86  --  106*  --   --   BUN 19  --  14  --   --   CREATININE 0.88  --  0.85  --   --   CALCIUM 9.0  --  8.4*  --   --   AST 26  --   --   --   --   ALT 17  --   --   --   --   ALKPHOS 54  --   --   --   --   BILITOT 0.9  --   --   --   --    < > = values in this interval not displayed.   ------------------------------------------------------------------------------------------------------------------  Cardiac Enzymes No results for input(s): TROPONINI in the last 168 hours. ------------------------------------------------------------------------------------------------------------------  RADIOLOGY:  No results found.  ASSESSMENT AND PLAN:   Active Problems:   Hyponatremia  85 year old female patient with a known history of breast cancer, chronic atrial fibrillation, bronchitis, COPD, hypertension presented to the emergency room for nausea patient felt nauseous and was about to throw up.  -Acute hyponatremia IV hydration with normal saline Monitor sodium level every 4 hourly Monitor for neurological changes As this is repeat admission with hyponatremia I have called nephrology consult.  -Dehydration Hydration with normal saline  -History of breast cancer Supportive care  -Hyperlipidemia Continue statin medication  -COPD Stable continue home dose inhalers  -DVT prophylaxis subcu Lovenox daily  -Swelling in left antecubital area I will get ultrasound Doppler study.  All the records are reviewed and case discussed with Care Management/Social Workerr. Management plans discussed with the patient, family and they are in agreement.  CODE STATUS: Full  TOTAL TIME TAKING CARE OF THIS PATIENT: 35 minutes.     POSSIBLE D/C IN 1-2 DAYS, DEPENDING ON CLINICAL CONDITION.   Janice Perkins M.D on 06/05/2019   Between 7am to 6pm - Pager - [phone]  After 6pm go to [email or web address] - password [email or web address]  [facility] [facility]  Office  [phone]  CC: Primary care physician; [name], MD  Note: This dictation was prepared with Dragon dictation along with smaller phrase technology. Any transcriptional errors that result from this process are unintentional.

## 2019-06-05 NOTE — Progress Notes (Signed)
[facility]  ROUNDING NOTE   Subjective:   Ms. Janice Perkins admitted to [facility] on 06/04/2019 for Hyponatremia [E87.1] Nausea [R11.0]  Started on IV normal saline infusion.   Objective:  Vital signs in last 24 hours:  Temp:  [97.8 F (36.6 C)-98.3 F (36.8 C)] 98.3 F (36.8 C) (10/19 0737) Pulse Rate:  [58-84] 66 (10/19 1344) Resp:  [16-17] 16 (10/19 0737) BP: (102-163)/(65-85) 138/72 (10/19 1344) SpO2:  [93 %-100 %] 96 % (10/19 1157) Weight:  [73.5 kg] 73.5 kg (10/18 1635)  Weight change:  Filed Weights   06/04/19 1159 06/04/19 1635  Weight: 72.1 kg 73.5 kg    Intake/Output: I/O last 3 completed shifts: In: 20 [P.O.:660] Out: -    Intake/Output this shift:  Total I/O In: 1607.7 [P.O.:480; I.V.:1127.7] Out: -   Physical Exam: General: NAD,   Head: Normocephalic, atraumatic. Moist oral mucosal membranes  Eyes: Anicteric, PERRL  Neck: Supple, trachea midline  Lungs:  Clear to auscultation  Heart: Regular rate and rhythm  Abdomen:  Soft, nontender,   Extremities:  no peripheral edema.  Neurologic: Nonfocal, moving all four extremities  Skin: No lesions        Basic Metabolic Panel: Recent Labs  Lab 05/31/19 1831 06/01/19 0454 06/04/19 1223  06/04/19 2201 06/05/19 0155 06/05/19 0416 06/05/19 0601 06/05/19 1011  NA 125* 131* 120*   < > 125* 124* 127* 125* 125*  K 4.3 4.4 4.8  --   --   --  4.5  --   --   CL 89* 95* 85*  --   --   --  94*  --   --   CO2 26 27 25   --   --   --  23  --   --   GLUCOSE 118* 103* 86  --   --   --  106*  --   --   BUN 20 16 19   --   --   --  14  --   --   CREATININE 1.03* 0.92 0.88  --   --   --  0.85  --   --   CALCIUM 9.2 8.9 9.0  --   --   --  8.4*  --   --    < > = values in this interval not displayed.    Liver Function Tests: Recent Labs  Lab 06/04/19 1223  AST 26  ALT 17  ALKPHOS 54  BILITOT 0.9  PROT 7.2  ALBUMIN 3.8   Recent Labs  Lab 06/04/19 1223  LIPASE 25   No results for  input(s): AMMONIA in the last 168 hours.  CBC: Recent Labs  Lab 05/31/19 1831 06/01/19 0454 06/04/19 1223 06/05/19 0416  WBC 9.5 8.2 9.7 8.0  HGB 13.0 12.9 13.2 12.0  HCT 38.3 38.2 38.5 34.4*  MCV 87.0 87.0 85.6 85.4  PLT 204 211 232 183    Cardiac Enzymes: No results for input(s): CKTOTAL, CKMB, CKMBINDEX, TROPONINI in the last 168 hours.  BNP: Invalid input(s): POCBNP  CBG: No results for input(s): GLUCAP in the last 168 hours.  Microbiology: Results for orders placed or performed during the hospital encounter of 06/04/19  SARS CORONAVIRUS 2 (TAT 6-24 HRS) Nasopharyngeal Nasopharyngeal Swab     Status: None   Collection Time: 06/04/19  3:22 PM   Specimen: Nasopharyngeal Swab  Result Value Ref Range Status   SARS Coronavirus 2 NEGATIVE NEGATIVE Final    Comment: (NOTE) SARS-CoV-2 target nucleic  acids are NOT DETECTED. The SARS-CoV-2 RNA is generally detectable in upper and lower respiratory specimens during the acute phase of infection. Negative results do not preclude SARS-CoV-2 infection, do not rule out co-infections with other pathogens, and should not be used as the sole basis for treatment or other patient management decisions. Negative results must be combined with clinical observations, patient history, and epidemiological information. The expected result is Negative. Fact Sheet for Patients: SugarRoll.be Fact Sheet for Healthcare Providers: https://www.woods-mathews.com/ This test is not yet approved or cleared by the [place] FDA and  has been authorized for detection and/or diagnosis of SARS-CoV-2 by FDA under an Emergency Use Authorization (EUA). This EUA will remain  in effect (meaning this test can be used) for the duration of the COVID-19 declaration under Section 56 4(b)(1) of the Act, 21 U.S.C. section 360bbb-3(b)(1), unless the authorization is terminated or revoked sooner. Performed at [facility], [place] [address], [place], [place] [address]     Coagulation Studies: No results for input(s): LABPROT, INR in the last 72 hours.  Urinalysis: Recent Labs    06/04/19 1417  COLORURINE STRAW*  LABSPEC 1.003*  PHURINE 7.0  GLUCOSEU NEGATIVE  HGBUR NEGATIVE  BILIRUBINUR NEGATIVE  KETONESUR NEGATIVE  PROTEINUR NEGATIVE  NITRITE NEGATIVE  LEUKOCYTESUR TRACE*      Imaging: No results found.   Medications:   . sodium chloride 75 mL/hr at 06/05/19 0803   . atorvastatin  40 mg Oral Daily  . busPIRone  10 mg Oral BID  . chlorpheniramine-HYDROcodone  5 mL Oral BID  . cholecalciferol  1,000 Units Oral Daily  . diltiazem  180 mg Oral Daily  . enoxaparin (LOVENOX) injection  40 mg Subcutaneous Q24H  . escitalopram  5 mg Oral Daily  . ferrous sulfate  325 mg Oral Q breakfast  . hydrALAZINE  50 mg Oral TID  . levothyroxine  50 mcg Oral QAC breakfast  . loratadine  10 mg Oral Daily  . losartan  100 mg Oral QHS  . magnesium oxide  400 mg Oral BID  . meloxicam  15 mg Oral Daily  . mometasone-formoterol  2 puff Inhalation BID  . pantoprazole  40 mg Oral Daily  . polyethylene glycol  17 g Oral Daily   acetaminophen **OR** acetaminophen, albuterol, ALPRAZolam, HYDROcodone-acetaminophen, meclizine, ondansetron **OR** ondansetron (ZOFRAN) IV, senna-docusate  Assessment/ Plan:  Janice Perkins is a 85 y.o. white female with breast cancer, atrial fibrillation, COPD, hypertension who is admitted with hyponatremia.   1. Hyponatremia: acute on chronic: history is consistent with Gi losses and poor PO intake Baseline sodium 130-135 Doubt that one dose of venlafaxine would have caused this. - Continue IV saline infusion - serial sodium checks.    LOS: 1 Janice Perkins 10/19/20202:46 PM

## 2019-06-05 NOTE — TOC Initial Note (Signed)
Transition of Care [facility]) - Initial/Assessment Note    Patient Details  Name: Janice Perkins MRN: [id number] Date of Birth: [birth year]  Transition of Care [facility]) CM/SW Contact:    Theodoro Koval, Lenice Llamas Phone Number: [phone]  06/05/2019, 4:35 PM  Clinical Narrative: Clinical Social Worker (CSW) met with patient and her husband Clair Gulling was at bedside. CSW introduced self and explained role of CSW department. Patient was alert and oriented X4 and was sitting up in the bed. Patient reported that she lives in [place] with her husband Clair Gulling and 2 of their adult children live with them. Patient reported that they have 7 adult children however one of their sons passed away from cancer. Patient reported that her husband Clair Gulling is her HPOA and provides transportation to her. Patient reported that she has 2 walkers and 1 bedside commode at home. Patient reported that she is on chronic oxygen 2 liters at night through [facility]. Patient reported that her PCP is Dr. Ginette Pitman at [facility]. Per patient her husband and herself have lived in their current home for 41 years. Patient reported that she has not felt the same since her recent back surgery. CSW provided emotional support. Patient reported that she has used [facility] in the past but does not believe she will need home health now.   Patient and her husband voiced their concerns about their granddaughter Marita [place] who is about to get her lights turned off because of an over due bill. Per patient Marita Kansas has 2 children ages 18 and 53 and needs financial assistance. CSW made patient aware of the [employer] and put in a fund request for [employer]. Per Clair Gulling he will bring the light bill to CSW tomorrow. CSW will continue to follow and assist as needed.            Expected Discharge Plan: Home/Self Care Barriers to Discharge: Continued Medical Work up   Patient Goals and CMS Choice Patient states their goals for this hospitalization and ongoing  recovery are:: To go home.      Expected Discharge Plan and Services Expected Discharge Plan: Home/Self Care In-house Referral: Clinical Social Work   Post Acute Care Choice: [place] arrangements for the past 2 months: [place]                                      Prior Living Arrangements/Services Living arrangements for the past 2 months: Single Family Home Lives with:: Spouse, Adult Children Patient language and need for interpreter reviewed:: No Do you feel safe going back to the place where you live?: Yes      Need for Family Participation in Patient Care: Yes (Comment) Care giver support system in place?: Yes (comment) Current home services: DME(Patient has 2 walkers and a bedside commode at home.) Criminal Activity/Legal Involvement Pertinent to Current Situation/Hospitalization: No - Comment as needed  Activities of Daily Living Home Assistive Devices/Equipment: None ADL Screening (condition at time of admission) Patient's cognitive ability adequate to safely complete daily activities?: Yes Is the patient deaf or have difficulty hearing?: No Does the patient have difficulty seeing, even when wearing glasses/contacts?: No Does the patient have difficulty concentrating, remembering, or making decisions?: No Patient able to express need for assistance with ADLs?: Yes Does the patient have difficulty dressing or bathing?: No Independently performs ADLs?: Yes (appropriate for developmental age) Does the patient  have difficulty walking or climbing stairs?: No Weakness of Legs: None Weakness of Arms/Hands: None  Permission Sought/Granted Permission sought to share information with : Other (comment)([employer] [employer]) Permission granted to share information with : Yes, Verbal Permission Granted              Emotional Assessment Appearance:: Appears stated age Attitude/Demeanor/Rapport: Engaged Affect (typically observed):  Accepting, Pleasant Orientation: : Oriented to Self, Oriented to Place, Oriented to  Time, Oriented to Situation Alcohol / Substance Use: Not Applicable Psych Involvement: No (comment)  Admission diagnosis:  Hyponatremia [E87.1] Nausea [R11.0] Patient Active Problem List   Diagnosis Date Noted  . Bronchitis 05/31/2019  . Hypothyroidism 05/31/2019  . Duodenum ulcer   . Melena   . GI bleed 02/17/2019  . Hypertensive encephalopathy 12/11/2018  . Hypoxia 05/30/2018  . Leukocytosis 04/05/2018  . Pneumonia 11/15/2017  . Pleural effusion 05/16/2017  . AF (paroxysmal atrial fibrillation) ([place]) 05/16/2017  . Breast cancer ([place]) 05/16/2017  . Dependence on nocturnal oxygen therapy 05/16/2017  . HTN (hypertension) 05/16/2017  . Generalized weakness 03/07/2017  . Hyponatremia 03/07/2017  . Dehydration 03/07/2017  . UTI (urinary tract infection) 03/07/2017  . HCAP (healthcare-associated pneumonia) 01/19/2017  . AKI (acute kidney injury) ([place]) 12/18/2016  . Primary cancer of upper outer quadrant of right female breast ([place]) 04/07/2016  . Lumbar radiculopathy 03/23/2016  . Spinal stenosis, lumbar region, with neurogenic claudication 03/23/2016  . DDD (degenerative disc disease), lumbar 01/14/2015  . Lumbosacral facet joint syndrome 01/14/2015  . Sacroiliac joint dysfunction 01/14/2015  . Greater trochanteric bursitis 01/14/2015  . Bilateral occipital neuralgia 01/14/2015   PCP:  [name], MD Pharmacy:   [place], [place] [address] Phone: [phone] Fax: [phone]     Social Determinants of Health (SDOH) Interventions    Readmission Risk Interventions Readmission Risk Prevention Plan 06/05/2019 06/01/2019 04/09/2019  Transportation Screening Complete Complete Complete  Medication Review (RN Care Manager) Referral to Pharmacy Complete Complete  PCP or Specialist appointment within 3-5 days of discharge (No  Data) Complete Patient refused  PCP/Specialist Appt Not Complete comments - - -  HRI or Home Care Consult Complete - Patient refused  SW Recovery Care/Counseling Consult Not Complete - Patient refused  SW Consult Not Complete Comments NA - -  Palliative Care Screening Not Applicable Not Applicable Not [place] Not Applicable Not Applicable Not Applicable  Some recent data might be hidden

## 2019-06-06 LAB — BASIC METABOLIC PANEL
Anion gap: 10 (ref 5–15)
BUN: 13 mg/dL (ref 8–23)
CO2: 24 mmol/L (ref 22–32)
Calcium: 9.1 mg/dL (ref 8.9–10.3)
Chloride: 96 mmol/L — ABNORMAL LOW (ref 98–111)
Creatinine, Ser: 0.87 mg/dL (ref 0.44–1.00)
GFR calc Af Amer: >60 mL/min (ref >60)
GFR calc non Af Amer: >60 mL/min (ref >60)
Glucose, Bld: 158 mg/dL — ABNORMAL HIGH (ref 70–99)
Potassium: 4.1 mmol/L (ref 3.5–5.1)
Sodium: 130 mmol/L — ABNORMAL LOW (ref 135–145)

## 2019-06-06 LAB — GLUCOSE, CAPILLARY: Glucose-Capillary: 94 mg/dL (ref 70–99)

## 2019-06-06 LAB — SODIUM
Sodium: 129 mmol/L — ABNORMAL LOW (ref 135–145)
Sodium: 129 mmol/L — ABNORMAL LOW (ref 135–145)
Sodium: 131 mmol/L — ABNORMAL LOW (ref 135–145)

## 2019-06-06 MED ORDER — ALPRAZOLAM 0.25 MG PO TABS
0.2500 mg | ORAL_TABLET | Freq: Once | ORAL | Status: AC
  Administered 2019-06-06: 0.25 mg via ORAL
  Filled 2019-06-06: qty 1

## 2019-06-06 MED ORDER — ALPRAZOLAM 0.5 MG PO TABS
0.5000 mg | ORAL_TABLET | Freq: Two times a day (BID) | ORAL | Status: DC | PRN
  Administered 2019-06-06 – 2019-06-07 (×3): 0.5 mg via ORAL
  Filled 2019-06-06 (×3): qty 1

## 2019-06-06 MED ORDER — METOPROLOL TARTRATE 25 MG PO TABS
25.0000 mg | ORAL_TABLET | Freq: Two times a day (BID) | ORAL | Status: DC
  Administered 2019-06-06 – 2019-06-08 (×5): 25 mg via ORAL
  Filled 2019-06-06 (×5): qty 1

## 2019-06-06 MED ORDER — INFLUENZA VAC A&B SA ADJ QUAD 0.5 ML IM PRSY
0.5000 mL | PREFILLED_SYRINGE | INTRAMUSCULAR | Status: AC
  Administered 2019-06-07: 0.5 mL via INTRAMUSCULAR
  Filled 2019-06-06 (×2): qty 0.5

## 2019-06-06 MED ORDER — ALUM & MAG HYDROXIDE-SIMETH 200-200-20 MG/5ML PO SUSP
30.0000 mL | ORAL | Status: DC | PRN
  Administered 2019-06-06 – 2019-06-08 (×3): 30 mL via ORAL
  Filled 2019-06-06 (×3): qty 30

## 2019-06-06 NOTE — TOC Progression Note (Addendum)
Transition of Care [facility]) - Progression Note    Patient Details  Name: Janice Perkins MRN: [id number] Date of Birth: [birth year]  Transition of Care [facility]) CM/SW Contact  Edinson Domeier, Lenice Llamas Phone Number: [phone]  06/06/2019, 2:19 PM  Clinical Narrative: Patient's husband Clair Gulling did bring his granddaughter Kristy's [employer] bill to [facility]. Clinical Education officer, museum (CSW) scanned bill and sent it via emil to [place] at [employer].  Patient and her husband Clair Gulling are agreeable to home health and prefer [facility] representative is aware of above and will accept patient. MD aware of above.   3:41 pm: CSW received confirmation from [place] Product manager stating that patient's granddaughter Marita Kansas Scarpelli's [employer] bill has been paid off. CSW met with patient and her husband Clair Gulling and made them aware of above. Patient and her husband were very appreciative and thanked CSW for assistance.      Expected Discharge Plan: Home/Self Care Barriers to Discharge: Continued Medical Work up  Expected Discharge Plan and Services Expected Discharge Plan: Home/Self Care In-house Referral: Clinical Social Work   Post Acute Care Choice: [place] arrangements for the past 2 months: [place] Determinants of Health (SDOH) Interventions    Readmission Risk Interventions Readmission Risk Prevention Plan 06/05/2019 06/01/2019 04/09/2019  Transportation Screening Complete Complete Complete  Medication Review (RN Care Manager) Referral to Pharmacy Complete Complete  PCP or Specialist appointment within 3-5 days of discharge (No Data) Complete Patient refused  PCP/Specialist Appt Not Complete comments - - -  HRI or Home Care Consult Complete - Patient refused  SW Recovery Care/Counseling Consult Not Complete - Patient refused  SW Consult Not Complete Comments NA - -   Palliative Care Screening Not Applicable Not Applicable Not [place] Not Applicable Not Applicable Not Applicable  Some recent data might be hidden

## 2019-06-06 NOTE — TOC Progression Note (Addendum)
Transition of Care [facility]) - Progression Note    Patient Details  Name: TEMPESTT SILBA MRN: [id number] Date of Birth: February 02, 1933  Transition of Care [facility]) CM/SW Contact  Charitie Hinote, Lenice Llamas Phone Number: [phone]  06/06/2019, 9:10 AM  Clinical Narrative: Per [place] charitable foundation representative they may be able to assist patient's granddaughter Marita Kansas with her light bill because Marita Kansas goes to [facility] which is affiliated with [employer]. Clinical Social Worker (CSW) met with patient and made her aware of above. CSW provided Kristy's DOB and address to [place]. CSW reminded patient that her husband Clair Gulling needs to bring the light bill to [facility] so CSW can give it to [employer]. CSW will continue to follow and assist as needed.  9:17 am: CSW contacted [employer] and confirmed that patient does have chronic oxygen at night through them.       Expected Discharge Plan: Home/Self Care Barriers to Discharge: Continued Medical Work up  Expected Discharge Plan and Services Expected Discharge Plan: Home/Self Care In-house Referral: Clinical Social Work   Post Acute Care Choice: [place] arrangements for the past 2 months: [place] Determinants of Health (SDOH) Interventions    Readmission Risk Interventions Readmission Risk Prevention Plan 06/05/2019 06/01/2019 04/09/2019  Transportation Screening Complete Complete Complete  Medication Review (RN Care Manager) Referral to Pharmacy Complete Complete  PCP or Specialist appointment within 3-5 days of discharge (No Data) Complete Patient refused  PCP/Specialist Appt Not Complete comments - - -  HRI or Home Care Consult Complete - Patient refused  SW Recovery Care/Counseling Consult Not Complete - Patient refused  SW Consult Not Complete Comments NA - -  Palliative Care Screening Not Applicable Not Applicable Not Normal Not Applicable Not Applicable Not Applicable  Some recent data might be hidden

## 2019-06-06 NOTE — Progress Notes (Signed)
[facility]  ROUNDING NOTE   Subjective:   Na 130   NS at 69mL/hr  Objective:  Vital signs in last 24 hours:  Temp:  [97.9 F (36.6 C)-98 F (36.7 C)] 97.9 F (36.6 C) (10/20 0821) Pulse Rate:  [61-92] 61 (10/20 1150) Resp:  [16-18] 18 (10/20 0821) BP: (138-189)/(72-95) 151/76 (10/20 1150) SpO2:  [96 %-97 %] 97 % (10/20 0821)  Weight change:  Filed Weights   06/04/19 1159 06/04/19 1635  Weight: 72.1 kg 73.5 kg    Intake/Output: I/O last 3 completed shifts: In: 2327.7 [P.O.:1200; I.V.:1127.7] Out: -    Intake/Output this shift:  Total I/O In: 2024.1 [P.O.:240; I.V.:1784.1] Out: -   Physical Exam: General: NAD,   Head: Normocephalic, atraumatic. Moist oral mucosal membranes  Eyes: Anicteric, PERRL  Neck: Supple, trachea midline  Lungs:  Clear to auscultation  Heart: Regular rate and rhythm  Abdomen:  Soft, nontender,   Extremities:  no peripheral edema.  Neurologic: Nonfocal, moving all four extremities  Skin: No lesions        Basic Metabolic Panel: Recent Labs  Lab 05/31/19 1831 06/01/19 0454 06/04/19 1223  06/05/19 0416 06/05/19 0601 06/05/19 1011 06/05/19 1356 06/06/19 0849  NA 125* 131* 120*   < > 127* 125* 125* 125* 130*  K 4.3 4.4 4.8  --  4.5  --   --   --  4.1  CL 89* 95* 85*  --  94*  --   --   --  96*  CO2 26 27 25   --  23  --   --   --  24  GLUCOSE 118* 103* 86  --  106*  --   --   --  158*  BUN 20 16 19   --  14  --   --   --  13  CREATININE 1.03* 0.92 0.88  --  0.85  --   --   --  0.87  CALCIUM 9.2 8.9 9.0  --  8.4*  --   --   --  9.1   < > = values in this interval not displayed.    Liver Function Tests: Recent Labs  Lab 06/04/19 1223  AST 26  ALT 17  ALKPHOS 54  BILITOT 0.9  PROT 7.2  ALBUMIN 3.8   Recent Labs  Lab 06/04/19 1223  LIPASE 25   No results for input(s): AMMONIA in the last 168 hours.  CBC: Recent Labs  Lab 05/31/19 1831 06/01/19 0454 06/04/19 1223 06/05/19 0416  WBC 9.5 8.2 9.7 8.0   HGB 13.0 12.9 13.2 12.0  HCT 38.3 38.2 38.5 34.4*  MCV 87.0 87.0 85.6 85.4  PLT 204 211 232 183    Cardiac Enzymes: No results for input(s): CKTOTAL, CKMB, CKMBINDEX, TROPONINI in the last 168 hours.  BNP: Invalid input(s): POCBNP  CBG: No results for input(s): GLUCAP in the last 168 hours.  Microbiology: Results for orders placed or performed during the hospital encounter of 06/04/19  SARS CORONAVIRUS 2 (TAT 6-24 HRS) Nasopharyngeal Nasopharyngeal Swab     Status: None   Collection Time: 06/04/19  3:22 PM   Specimen: Nasopharyngeal Swab  Result Value Ref Range Status   SARS Coronavirus 2 NEGATIVE NEGATIVE Final    Comment: (NOTE) SARS-CoV-2 target nucleic acids are NOT DETECTED. The SARS-CoV-2 RNA is generally detectable in upper and lower respiratory specimens during the acute phase of infection. Negative results do not preclude SARS-CoV-2 infection, do not rule out co-infections with other  pathogens, and should not be used as the sole basis for treatment or other patient management decisions. Negative results must be combined with clinical observations, patient history, and epidemiological information. The expected result is Negative. Fact Sheet for Patients: SugarRoll.be Fact Sheet for Healthcare Providers: https://www.woods-mathews.com/ This test is not yet approved or cleared by the [place] FDA and  has been authorized for detection and/or diagnosis of SARS-CoV-2 by FDA under an Emergency Use Authorization (EUA). This EUA will remain  in effect (meaning this test can be used) for the duration of the COVID-19 declaration under Section 56 4(b)(1) of the Act, 21 U.S.C. section 360bbb-3(b)(1), unless the authorization is terminated or revoked sooner. Performed at [facility], [place] [address] [place], [place] [address]     Coagulation Studies: No results for input(s): LABPROT, INR in the last 72  hours.  Urinalysis: Recent Labs    06/04/19 1417  COLORURINE STRAW*  LABSPEC 1.003*  PHURINE 7.0  GLUCOSEU NEGATIVE  HGBUR NEGATIVE  BILIRUBINUR NEGATIVE  KETONESUR NEGATIVE  PROTEINUR NEGATIVE  NITRITE NEGATIVE  LEUKOCYTESUR TRACE*      Imaging: US Venous Img Upper Uni Left  Result Date: 06/05/2019 CLINICAL DATA:  Left upper extremity edema. EXAM: LEFT UPPER EXTREMITY VENOUS DOPPLER ULTRASOUND TECHNIQUE: Gray-scale sonography with graded compression, as well as color Doppler and duplex ultrasound were performed to evaluate the upper extremity deep venous system from the level of the subclavian vein and including the jugular, axillary, basilic, radial, ulnar and upper cephalic vein. Spectral Doppler was utilized to evaluate flow at rest and with distal augmentation maneuvers. COMPARISON:  None. FINDINGS: Contralateral Subclavian Vein: Respiratory phasicity is normal and symmetric with the symptomatic side. No evidence of thrombus. Normal compressibility. Internal Jugular Vein: No evidence of thrombus. Normal compressibility, respiratory phasicity and response to augmentation. Subclavian Vein: No evidence of thrombus. Normal compressibility, respiratory phasicity and response to augmentation. Axillary Vein: No evidence of thrombus. Normal compressibility, respiratory phasicity and response to augmentation. Cephalic Vein: No evidence of thrombus. Normal compressibility, respiratory phasicity and response to augmentation. Basilic Vein: No evidence of thrombus. Normal compressibility, respiratory phasicity and response to augmentation. Brachial Veins: No evidence of thrombus. Normal compressibility, respiratory phasicity and response to augmentation. Radial Veins: No evidence of thrombus. Normal compressibility, respiratory phasicity and response to augmentation. Ulnar Veins: No evidence of thrombus. Normal compressibility, respiratory phasicity and response to augmentation. Venous Reflux:  None  visualized. Other Findings: No evidence of superficial thrombophlebitis or abnormal fluid collection. IMPRESSION: No evidence of DVT within the left upper extremity. Electronically Signed   By: Aletta Edouard M.D.   On: 06/05/2019 15:45     Medications:   . sodium chloride 75 mL/hr at 06/06/19 1100   . atorvastatin  40 mg Oral Daily  . busPIRone  10 mg Oral BID  . chlorpheniramine-HYDROcodone  5 mL Oral BID  . cholecalciferol  1,000 Units Oral Daily  . diltiazem  180 mg Oral Daily  . enoxaparin (LOVENOX) injection  40 mg Subcutaneous Q24H  . escitalopram  5 mg Oral Daily  . ferrous sulfate  325 mg Oral Q breakfast  . hydrALAZINE  50 mg Oral TID  . [START ON 06/07/2019] influenza vaccine adjuvanted  0.5 mL Intramuscular Tomorrow-1000  . levothyroxine  50 mcg Oral QAC breakfast  . loratadine  10 mg Oral Daily  . losartan  100 mg Oral QHS  . magnesium oxide  400 mg Oral BID  . meloxicam  15 mg Oral Daily  .  metoprolol tartrate  25 mg Oral BID  . mometasone-formoterol  2 puff Inhalation BID  . pantoprazole  40 mg Oral Daily  . polyethylene glycol  17 g Oral Daily   acetaminophen **OR** acetaminophen, albuterol, ALPRAZolam, HYDROcodone-acetaminophen, meclizine, metoprolol tartrate, ondansetron **OR** ondansetron (ZOFRAN) IV, senna-docusate  Assessment/ Plan:  Janice Perkins is a 85 y.o. white female with breast cancer, atrial fibrillation, COPD, hypertension who is admitted with hyponatremia.   1. Hyponatremia: acute on chronic: history is consistent with Gi losses and poor PO intake Baseline sodium 130-135 Doubt that one dose of venlafaxine would have caused this. - Continue IV saline infusion  2. Hypertension with atrial fibrillation - metoprolol, losartan, hydralazine, and diltiazem.    LOS: 2 Deontre Allsup 10/20/202012:19 PM

## 2019-06-06 NOTE — Progress Notes (Signed)
[place] [facility] [place] NAME: Janice Perkins    MR#:  [id number]  DATE OF BIRTH:  11/17/1932  SUBJECTIVE:  CHIEF COMPLAINT:   Chief Complaint  Patient presents with  . Nausea   Came with nausea and noted to have low sodium.  She was recently admitted for hyponatremia and discharged 3 days ago. Sodium level improved some with IV fluid.  Patient feels still having weakness. REVIEW OF SYSTEMS:  CONSTITUTIONAL: No fever, have fatigue or weakness.  EYES: No blurred or double vision.  EARS, NOSE, AND THROAT: No tinnitus or ear pain.  RESPIRATORY: No cough, shortness of breath, wheezing or hemoptysis.  CARDIOVASCULAR: No chest pain, orthopnea, edema.  GASTROINTESTINAL: No nausea, vomiting, diarrhea or abdominal pain.  GENITOURINARY: No dysuria, hematuria.  ENDOCRINE: No polyuria, nocturia,  HEMATOLOGY: No anemia, easy bruising or bleeding SKIN: No rash or lesion. MUSCULOSKELETAL: No joint pain or arthritis.   NEUROLOGIC: No tingling, numbness, weakness.  PSYCHIATRY: No anxiety or depression.   ROS  DRUG ALLERGIES:   Allergies  Allergen Reactions  . Diphenhydramine Hcl Rash  . Penicillins Hives    .Has patient had a PCN reaction causing immediate rash, facial/tongue/throat swelling, SOB or lightheadedness with hypotension: Unknown Has patient had a PCN reaction causing severe rash involving mucus membranes or skin necrosis: Unknown Has patient had a PCN reaction that required hospitalization: Unknown Has patient had a PCN reaction occurring within the last 10 years: Unknown If all of the above answers are "NO", then may proceed with Cephalosporin use.   . Captopril Other (See Comments)  . Eliquis [Apixaban] Other (See Comments)    Stomach bleed  . Enalapril Maleate Other (See Comments)    Other reaction(s): Unknown  . Tape Itching  . Terfenadine Other (See Comments)  . Augmentin [Amoxicillin-Pot Clavulanate] Rash    Has patient had a PCN  reaction causing immediate rash, facial/tongue/throat swelling, SOB or lightheadedness with hypotension: Unknown Has patient had a PCN reaction causing severe rash involving mucus membranes or skin necrosis: Unknown Has patient had a PCN reaction that required hospitalization: Unknown Has patient had a PCN reaction occurring within the last 10 years: Unknown If all of the above answers are "NO", then may proceed with Cephalosporin use.  . Biaxin [Clarithromycin] Rash, Other (See Comments) and Hives    Other reaction(s): Unknown    VITALS:  Blood pressure (!) 151/76, pulse 61, temperature 97.9 F (36.6 C), temperature source Oral, resp. rate 18, height 5' (1.524 m), weight 73.5 kg, SpO2 97 %.  PHYSICAL EXAMINATION:  GENERAL:  85 y.o.-year-old patient lying in the bed with no acute distress.  EYES: Pupils equal, round, reactive to light and accommodation. No scleral icterus. Extraocular muscles intact.  HEENT: Head atraumatic, normocephalic. Oropharynx and nasopharynx clear.  NECK:  Supple, no jugular venous distention. No thyroid enlargement, no tenderness.  LUNGS: Normal breath sounds bilaterally, no wheezing, rales,rhonchi or crepitation. No use of accessory muscles of respiration.  CARDIOVASCULAR: S1, S2 normal. No murmurs, rubs, or gallops.  ABDOMEN: Soft, nontender, nondistended. Bowel sounds present. No organomegaly or mass.  EXTREMITIES: No pedal edema, cyanosis, or clubbing.  NEUROLOGIC: Cranial nerves II through XII are intact. Muscle strength 4/5 in all extremities. Sensation intact. Gait not checked.  PSYCHIATRIC: The patient is alert and oriented x 3.  SKIN: No obvious rash, lesion, or ulcer.   Physical Exam LABORATORY PANEL:   CBC Recent Labs  Lab 06/05/19 0416  WBC 8.0  HGB 12.0  HCT 34.4*  PLT 183   ------------------------------------------------------------------------------------------------------------------  Chemistries  Recent Labs  Lab 06/04/19 1223   06/06/19 0849  NA 120*   < > 130*  K 4.8   < > 4.1  CL 85*   < > 96*  CO2 25   < > 24  GLUCOSE 86   < > 158*  BUN 19   < > 13  CREATININE 0.88   < > 0.87  CALCIUM 9.0   < > 9.1  AST 26  --   --   ALT 17  --   --   ALKPHOS 54  --   --   BILITOT 0.9  --   --    < > = values in this interval not displayed.   ------------------------------------------------------------------------------------------------------------------  Cardiac Enzymes No results for input(s): TROPONINI in the last 168 hours. ------------------------------------------------------------------------------------------------------------------  RADIOLOGY:  US Venous Img Upper Uni Left  Result Date: 06/05/2019 CLINICAL DATA:  Left upper extremity edema. EXAM: LEFT UPPER EXTREMITY VENOUS DOPPLER ULTRASOUND TECHNIQUE: Gray-scale sonography with graded compression, as well as color Doppler and duplex ultrasound were performed to evaluate the upper extremity deep venous system from the level of the subclavian vein and including the jugular, axillary, basilic, radial, ulnar and upper cephalic vein. Spectral Doppler was utilized to evaluate flow at rest and with distal augmentation maneuvers. COMPARISON:  None. FINDINGS: Contralateral Subclavian Vein: Respiratory phasicity is normal and symmetric with the symptomatic side. No evidence of thrombus. Normal compressibility. Internal Jugular Vein: No evidence of thrombus. Normal compressibility, respiratory phasicity and response to augmentation. Subclavian Vein: No evidence of thrombus. Normal compressibility, respiratory phasicity and response to augmentation. Axillary Vein: No evidence of thrombus. Normal compressibility, respiratory phasicity and response to augmentation. Cephalic Vein: No evidence of thrombus. Normal compressibility, respiratory phasicity and response to augmentation. Basilic Vein: No evidence of thrombus. Normal compressibility, respiratory phasicity and response to  augmentation. Brachial Veins: No evidence of thrombus. Normal compressibility, respiratory phasicity and response to augmentation. Radial Veins: No evidence of thrombus. Normal compressibility, respiratory phasicity and response to augmentation. Ulnar Veins: No evidence of thrombus. Normal compressibility, respiratory phasicity and response to augmentation. Venous Reflux:  None visualized. Other Findings: No evidence of superficial thrombophlebitis or abnormal fluid collection. IMPRESSION: No evidence of DVT within the left upper extremity. Electronically Signed   By: Aletta Edouard M.D.   On: 06/05/2019 15:45    ASSESSMENT AND PLAN:   Active Problems:   Hyponatremia  85 year old female patient with a known history of breast cancer, chronic atrial fibrillation, bronchitis, COPD, hypertension presented to the emergency room for nausea patient felt nauseous and was about to throw up.  -Acute hyponatremia IV hydration with normal saline Monitor sodium level every 4 hourly Monitor for neurological changes As this is repeat admission with hyponatremia I have called nephrology consult. Continued with IV fluid and sodium level is up 130 today. Patient still have complaint of feeling generalized weakness I will continue IV fluid and get physical therapy evaluation.  -Dehydration Hydration with normal saline  -History of breast cancer Supportive care  -Hyperlipidemia Continue statin medication  -COPD Stable continue home dose inhalers  -DVT prophylaxis subcu Lovenox daily  -Swelling in left antecubital area No DVT as per Doppler studies.  All the records are reviewed and case discussed with Care Management/Social Workerr. Management plans discussed with the patient, family and they are in agreement.  CODE STATUS: Full  TOTAL TIME TAKING CARE OF THIS PATIENT: 35 minutes.  POSSIBLE D/C IN 1-2 DAYS, DEPENDING ON CLINICAL CONDITION.   Vaughan Basta M.D on 06/06/2019    Between 7am to 6pm - Pager - [phone]  After 6pm go to [email or web address] - password [email or web address] [facility]  Office  [phone]  CC: Primary care physician; [name], MD  Note: This dictation was prepared with Dragon dictation along with smaller phrase technology. Any transcriptional errors that result from this process are unintentional.

## 2019-06-06 NOTE — Progress Notes (Signed)
PT Cancellation Note  Patient Details Name: Janice Perkins MRN: [id number] DOB: [birth year]   Cancelled Treatment:    Reason Eval/Treat Not Completed: Other (comment).  PT consult received.  Chart reviewed.  Upon therapist arriving to pt's room, staff member entering pt's room with emesis bag and reporting pt not currently feeling well (lightheaded; nauseas).  Will re-attempt PT evaluation at a later date/time as able.  Leitha Bleak, PT 06/06/19, 1:35 PM [phone]

## 2019-06-07 LAB — SODIUM
Sodium: 131 mmol/L — ABNORMAL LOW (ref 135–145)
Sodium: 128 mmol/L — ABNORMAL LOW (ref 135–145)

## 2019-06-07 LAB — BASIC METABOLIC PANEL
Anion gap: 9 (ref 5–15)
BUN: 17 mg/dL (ref 8–23)
CO2: 23 mmol/L (ref 22–32)
Calcium: 8.6 mg/dL — ABNORMAL LOW (ref 8.9–10.3)
Chloride: 99 mmol/L (ref 98–111)
Creatinine, Ser: 0.94 mg/dL (ref 0.44–1.00)
GFR calc Af Amer: >60 mL/min (ref >60)
GFR calc non Af Amer: 56 mL/min — ABNORMAL LOW (ref >60)
Glucose, Bld: 106 mg/dL — ABNORMAL HIGH (ref 70–99)
Potassium: 4.4 mmol/L (ref 3.5–5.1)
Sodium: 131 mmol/L — ABNORMAL LOW (ref 135–145)

## 2019-06-07 MED ORDER — SODIUM CHLORIDE 1 G PO TABS
1.0000 g | ORAL_TABLET | Freq: Two times a day (BID) | ORAL | Status: DC
  Administered 2019-06-07 – 2019-06-08 (×2): 1 g via ORAL
  Filled 2019-06-07 (×3): qty 1

## 2019-06-07 NOTE — Progress Notes (Signed)
[place] [facility] [place] NAME: Janice Perkins    MR#:  [id number]  DATE OF BIRTH:  1933-02-26  SUBJECTIVE:  CHIEF COMPLAINT:   Chief Complaint  Patient presents with  . Nausea   The patient still has nausea, sodium down to 128. REVIEW OF SYSTEMS:  CONSTITUTIONAL: No fever, have fatigue or weakness.  EYES: No blurred or double vision.  EARS, NOSE, AND THROAT: No tinnitus or ear pain.  RESPIRATORY: No cough, shortness of breath, wheezing or hemoptysis.  CARDIOVASCULAR: No chest pain, orthopnea, edema.  GASTROINTESTINAL: No nausea, vomiting, diarrhea or abdominal pain.  GENITOURINARY: No dysuria, hematuria.  ENDOCRINE: No polyuria, nocturia,  HEMATOLOGY: No anemia, easy bruising or bleeding SKIN: No rash or lesion. MUSCULOSKELETAL: No joint pain or arthritis.   NEUROLOGIC: No tingling, numbness, weakness.  PSYCHIATRY: No anxiety or depression.   ROS  DRUG ALLERGIES:   Allergies  Allergen Reactions  . Diphenhydramine Hcl Rash  . Penicillins Hives    .Has patient had a PCN reaction causing immediate rash, facial/tongue/throat swelling, SOB or lightheadedness with hypotension: Unknown Has patient had a PCN reaction causing severe rash involving mucus membranes or skin necrosis: Unknown Has patient had a PCN reaction that required hospitalization: Unknown Has patient had a PCN reaction occurring within the last 10 years: Unknown If all of the above answers are "NO", then may proceed with Cephalosporin use.   . Captopril Other (See Comments)  . Eliquis [Apixaban] Other (See Comments)    Stomach bleed  . Enalapril Maleate Other (See Comments)    Other reaction(s): Unknown  . Tape Itching  . Terfenadine Other (See Comments)  . Augmentin [Amoxicillin-Pot Clavulanate] Rash    Has patient had a PCN reaction causing immediate rash, facial/tongue/throat swelling, SOB or lightheadedness with hypotension: Unknown Has patient had a PCN reaction  causing severe rash involving mucus membranes or skin necrosis: Unknown Has patient had a PCN reaction that required hospitalization: Unknown Has patient had a PCN reaction occurring within the last 10 years: Unknown If all of the above answers are "NO", then may proceed with Cephalosporin use.  . Biaxin [Clarithromycin] Rash, Other (See Comments) and Hives    Other reaction(s): Unknown    VITALS:  Blood pressure (!) 169/64, pulse 72, temperature 98.5 F (36.9 C), temperature source Oral, resp. rate 18, height 5' (1.524 m), weight 73.5 kg, SpO2 95 %.  PHYSICAL EXAMINATION:  GENERAL:  85 y.o.-year-old patient lying in the bed with no acute distress.  EYES: Pupils equal, round, reactive to light and accommodation. No scleral icterus. Extraocular muscles intact.  HEENT: Head atraumatic, normocephalic. NECK:  Supple, no jugular venous distention. No thyroid enlargement, no tenderness.  LUNGS: Normal breath sounds bilaterally, no wheezing, rales,rhonchi or crepitation. No use of accessory muscles of respiration.  CARDIOVASCULAR: S1, S2 normal. No murmurs, rubs, or gallops.  ABDOMEN: Soft, nontender, nondistended. Bowel sounds present. No organomegaly or mass.  EXTREMITIES: No pedal edema, cyanosis, or clubbing.  NEUROLOGIC: Cranial nerves II through XII are intact. Muscle strength 4/5 in all extremities. Sensation intact. Gait not checked.  PSYCHIATRIC: The patient is alert and oriented x 3.  SKIN: No obvious rash, lesion, or ulcer.   Physical Exam LABORATORY PANEL:   CBC Recent Labs  Lab 06/05/19 0416  WBC 8.0  HGB 12.0  HCT 34.4*  PLT 183   ------------------------------------------------------------------------------------------------------------------  Chemistries  Recent Labs  Lab 06/04/19 1223  06/07/19 0357 06/07/19 0848  NA 120*   < >  131* 128*  K 4.8   < > 4.4  --   CL 85*   < > 99  --   CO2 25   < > 23  --   GLUCOSE 86   < > 106*  --   BUN 19   < > 17  --    CREATININE 0.88   < > 0.94  --   CALCIUM 9.0   < > 8.6*  --   AST 26  --   --   --   ALT 17  --   --   --   ALKPHOS 54  --   --   --   BILITOT 0.9  --   --   --    < > = values in this interval not displayed.   ------------------------------------------------------------------------------------------------------------------  Cardiac Enzymes No results for input(s): TROPONINI in the last 168 hours. ------------------------------------------------------------------------------------------------------------------  RADIOLOGY:  US Venous Img Upper Uni Left  Result Date: 06/05/2019 CLINICAL DATA:  Left upper extremity edema. EXAM: LEFT UPPER EXTREMITY VENOUS DOPPLER ULTRASOUND TECHNIQUE: Gray-scale sonography with graded compression, as well as color Doppler and duplex ultrasound were performed to evaluate the upper extremity deep venous system from the level of the subclavian vein and including the jugular, axillary, basilic, radial, ulnar and upper cephalic vein. Spectral Doppler was utilized to evaluate flow at rest and with distal augmentation maneuvers. COMPARISON:  None. FINDINGS: Contralateral Subclavian Vein: Respiratory phasicity is normal and symmetric with the symptomatic side. No evidence of thrombus. Normal compressibility. Internal Jugular Vein: No evidence of thrombus. Normal compressibility, respiratory phasicity and response to augmentation. Subclavian Vein: No evidence of thrombus. Normal compressibility, respiratory phasicity and response to augmentation. Axillary Vein: No evidence of thrombus. Normal compressibility, respiratory phasicity and response to augmentation. Cephalic Vein: No evidence of thrombus. Normal compressibility, respiratory phasicity and response to augmentation. Basilic Vein: No evidence of thrombus. Normal compressibility, respiratory phasicity and response to augmentation. Brachial Veins: No evidence of thrombus. Normal compressibility, respiratory phasicity  and response to augmentation. Radial Veins: No evidence of thrombus. Normal compressibility, respiratory phasicity and response to augmentation. Ulnar Veins: No evidence of thrombus. Normal compressibility, respiratory phasicity and response to augmentation. Venous Reflux:  None visualized. Other Findings: No evidence of superficial thrombophlebitis or abnormal fluid collection. IMPRESSION: No evidence of DVT within the left upper extremity. Electronically Signed   By: Aletta Edouard M.D.   On: 06/05/2019 15:45    ASSESSMENT AND PLAN:   Active Problems:   Hyponatremia  85 year old female patient with a known history of breast cancer, chronic atrial fibrillation, bronchitis, COPD, hypertension presented to the emergency room for nausea patient felt nauseous and was about to throw up.  -Acute hyponatremia Improving with IV hydration with normal saline Bolus sodium is down to 128 this morning. Continue normal saline IV, p.o. salt tablets per Dr. Juleen China.  -Dehydration Hydration with normal saline  -History of breast cancer Supportive care  -Hyperlipidemia Continue statin medication  -COPD Stable continue home dose inhalers  -DVT prophylaxis subcu Lovenox daily  -Swelling in left antecubital area No evidence of DVT within the left upper extremity per ultrasound.  I discussed with Dr. Juleen China. All the records are reviewed and case discussed with Care Management/Social Workerr. Management plans discussed with the patient, family and they are in agreement.  CODE STATUS: Full  TOTAL TIME TAKING CARE OF THIS PATIENT: 35 minutes.  POSSIBLE D/C IN 1-2 DAYS, DEPENDING ON CLINICAL CONDITION.   Demetrios Loll M.D on 06/07/2019  Between 7am to 6pm - Pager - [phone]  After 6pm go to [email or web address] - password [email or web address] [facility]  Office  [phone]  CC: Primary care physician; [name], MD  Note: This dictation was prepared with Dragon  dictation along with smaller phrase technology. Any transcriptional errors that result from this process are unintentional.

## 2019-06-07 NOTE — Evaluation (Signed)
Physical Therapy Evaluation Patient Details Name: Janice Perkins MRN: [id number] DOB: [birth year] Today's Date: 06/07/2019   History of Present Illness  Pt is an 85 y.o. female presenting to hospital 06/04/19 with nausea and weakness; pt with recent admit d/t hyponatremia.  Pt now admitted with acute hyponatremia and dehydration.  PMH includes asthma, a-fib, breast CA (pt reports s/p lumpectomy), COPD, htn, shingles.  Clinical Impression  Prior to hospital admission, pt was independent.  Pt lives with her husband and 2 adult children in 1 level home.  Currently pt is modified independent with bed mobility; SBA with transfers; and CGA with ambulation in room (pt preferring to hold onto IV pole and declined to trial RW during session); limited activities d/t nausea.  Pt would benefit from skilled PT to address noted impairments and functional limitations (see below for any additional details).  Upon hospital discharge, recommend pt discharge with HHPT and support of family.    Follow Up Recommendations Home health PT    Equipment Recommendations  Rolling walker with 5" wheels    Recommendations for Other Services       Precautions / Restrictions Precautions Precautions: Fall Restrictions Weight Bearing Restrictions: No      Mobility  Bed Mobility Overal bed mobility: Modified Independent             General bed mobility comments: semi-supine to/from sit via logrolling (pt reports logrolling d/t h/o back surgery); use of bed rail  Transfers Overall transfer level: Needs assistance Equipment used: None Transfers: Sit to/from Stand Sit to Stand: Supervision         General transfer comment: fairly strong stand from bed with single UE support  Ambulation/Gait Ambulation/Gait assistance: Min guard Gait Distance (Feet): 60 Feet(in room) Assistive device: IV Pole   Gait velocity: decreased   General Gait Details: partial step through gait pattern; pt declined use of RW  and preferred to use IV pole instead; limited d/t nausea  Stairs            Wheelchair Mobility    Modified Rankin (Stroke Patients Only)       Balance Overall balance assessment: Needs assistance Sitting-balance support: No upper extremity supported;Feet supported Sitting balance-Leahy Scale: Normal Sitting balance - Comments: steady sitting reaching outside BOS   Standing balance support: No upper extremity supported Standing balance-Leahy Scale: Good Standing balance comment: steady standing reaching within BOS                             Pertinent Vitals/Pain Pain Assessment: No/denies pain  Vitals (HR and O2 on room air) stable and WFL throughout treatment session.    Home Living Family/patient expects to be discharged to:: Private residence Living Arrangements: Spouse/significant other;Children(2 adult children) Available Help at Discharge: Family Type of Home: House Home Access: Level entry(from back)     Home Layout: One level Home Equipment: Walker - standard;Toilet riser;Walker - 4 wheels      Prior Function Level of Independence: Independent               Hand Dominance        Extremity/Trunk Assessment   Upper Extremity Assessment Upper Extremity Assessment: Generalized weakness    Lower Extremity Assessment Lower Extremity Assessment: Generalized weakness    Cervical / Trunk Assessment Cervical / Trunk Assessment: Normal  Communication   Communication: HOH;Other (comment)(Verbose)  Cognition Arousal/Alertness: Awake/alert Behavior During Therapy: (verbose) Overall Cognitive Status: Within Functional Limits for  tasks assessed                                        General Comments   Nursing cleared pt for participation in physical therapy.  Pt agreeable to PT session.    Exercises     Assessment/Plan    PT Assessment Patient needs continued PT services  PT Problem List Decreased  strength;Decreased activity tolerance;Decreased balance;Decreased mobility;Decreased knowledge of use of DME       PT Treatment Interventions DME instruction;Gait training;Functional mobility training;Therapeutic activities;Therapeutic exercise;Balance training;Patient/family education    PT Goals (Current goals can be found in the Care Plan section)  Acute Rehab PT Goals Patient Stated Goal: to feel better and go home PT Goal Formulation: With patient Time For Goal Achievement: 06/21/19 Potential to Achieve Goals: Good    Frequency Min 2X/week   Barriers to discharge        Co-evaluation               AM-PAC PT "6 Clicks" Mobility  Outcome Measure Help needed turning from your back to your side while in a flat bed without using bedrails?: None Help needed moving from lying on your back to sitting on the side of a flat bed without using bedrails?: None Help needed moving to and from a bed to a chair (including a wheelchair)?: A Little Help needed standing up from a chair using your arms (e.g., wheelchair or bedside chair)?: A Little Help needed to walk in hospital room?: A Little Help needed climbing 3-5 steps with a railing? : A Little 6 Click Score: 20    End of Session Equipment Utilized During Treatment: Gait belt Activity Tolerance: Other (comment)(Limited d/t nausea) Patient left: in bed;with call bell/phone within reach(nurse cleared pt not to have bed alarm on) Nurse Communication: Mobility status;Precautions;Other (comment)(Nurse aware of pt's nausea) PT Visit Diagnosis: Unsteadiness on feet (R26.81);Muscle weakness (generalized) (M62.81);Difficulty in walking, not elsewhere classified (R26.2)    Time: [id number] PT Time Calculation (min) (ACUTE ONLY): 13 min   Charges:   PT Evaluation $PT Eval Low Complexity: 1 Low          [place], PT 06/07/19, 9:42 AM [phone]

## 2019-06-07 NOTE — Progress Notes (Signed)
[facility]  ROUNDING NOTE   Subjective:   Na 128 (130)  NS at 26mL/hr  Complains of nausea  Objective:  Vital signs in last 24 hours:  Temp:  [98.5 F (36.9 C)-99 F (37.2 C)] 98.5 F (36.9 C) (10/21 0855) Pulse Rate:  [51-72] 72 (10/21 0855) Resp:  [18-19] 18 (10/21 0855) BP: (124-169)/(54-85) 169/64 (10/21 0855) SpO2:  [92 %-96 %] 95 % (10/21 0855)  Weight change:  Filed Weights   06/04/19 1159 06/04/19 1635  Weight: 72.1 kg 73.5 kg    Intake/Output: I/O last 3 completed shifts: In: 2264.1 [P.O.:480; I.V.:1784.1] Out: -    Intake/Output this shift:  Total I/O In: 1961.7 [P.O.:240; I.V.:1721.7] Out: -   Physical Exam: General: NAD,   Head: Normocephalic, atraumatic. Moist oral mucosal membranes  Eyes: Anicteric, PERRL  Neck: Supple, trachea midline  Lungs:  Clear to auscultation  Heart: Regular rate and rhythm  Abdomen:  Soft, nontender,   Extremities:  no peripheral edema.  Neurologic: Nonfocal, moving all four extremities  Skin: No lesions        Basic Metabolic Panel: Recent Labs  Lab 06/01/19 0454 06/04/19 1223  06/05/19 0416  06/06/19 0849  06/06/19 1709 06/06/19 2104 06/07/19 0051 06/07/19 0357 06/07/19 0848  NA 131* 120*   < > 127*   < > 130*   < > 129* 131* 131* 131* 128*  K 4.4 4.8  --  4.5  --  4.1  --   --   --   --  4.4  --   CL 95* 85*  --  94*  --  96*  --   --   --   --  99  --   CO2 27 25  --  23  --  24  --   --   --   --  23  --   GLUCOSE 103* 86  --  106*  --  158*  --   --   --   --  106*  --   BUN 16 19  --  14  --  13  --   --   --   --  17  --   CREATININE 0.92 0.88  --  0.85  --  0.87  --   --   --   --  0.94  --   CALCIUM 8.9 9.0  --  8.4*  --  9.1  --   --   --   --  8.6*  --    < > = values in this interval not displayed.    Liver Function Tests: Recent Labs  Lab 06/04/19 1223  AST 26  ALT 17  ALKPHOS 54  BILITOT 0.9  PROT 7.2  ALBUMIN 3.8   Recent Labs  Lab 06/04/19 1223  LIPASE 25    No results for input(s): AMMONIA in the last 168 hours.  CBC: Recent Labs  Lab 05/31/19 1831 06/01/19 0454 06/04/19 1223 06/05/19 0416  WBC 9.5 8.2 9.7 8.0  HGB 13.0 12.9 13.2 12.0  HCT 38.3 38.2 38.5 34.4*  MCV 87.0 87.0 85.6 85.4  PLT 204 211 232 183    Cardiac Enzymes: No results for input(s): CKTOTAL, CKMB, CKMBINDEX, TROPONINI in the last 168 hours.  BNP: Invalid input(s): POCBNP  CBG: Recent Labs  Lab 06/06/19 [id number]    Microbiology: Results for orders placed or performed during the hospital encounter of 06/04/19  SARS CORONAVIRUS 2 (TAT 6-24  HRS) Nasopharyngeal Nasopharyngeal Swab     Status: None   Collection Time: 06/04/19  3:22 PM   Specimen: Nasopharyngeal Swab  Result Value Ref Range Status   SARS Coronavirus 2 NEGATIVE NEGATIVE Final    Comment: (NOTE) SARS-CoV-2 target nucleic acids are NOT DETECTED. The SARS-CoV-2 RNA is generally detectable in upper and lower respiratory specimens during the acute phase of infection. Negative results do not preclude SARS-CoV-2 infection, do not rule out co-infections with other pathogens, and should not be used as the sole basis for treatment or other patient management decisions. Negative results must be combined with clinical observations, patient history, and epidemiological information. The expected result is Negative. Fact Sheet for Patients: SugarRoll.be Fact Sheet for Healthcare Providers: https://www.woods-mathews.com/ This test is not yet approved or cleared by the [place] FDA and  has been authorized for detection and/or diagnosis of SARS-CoV-2 by FDA under an Emergency Use Authorization (EUA). This EUA will remain  in effect (meaning this test can be used) for the duration of the COVID-19 declaration under Section 56 4(b)(1) of the Act, 21 U.S.C. section 360bbb-3(b)(1), unless the authorization is terminated or revoked sooner. Performed at  [facility], [place] [address], [place], [place] [address]     Coagulation Studies: No results for input(s): LABPROT, INR in the last 72 hours.  Urinalysis: Recent Labs    06/04/19 1417  COLORURINE STRAW*  LABSPEC 1.003*  PHURINE 7.0  GLUCOSEU NEGATIVE  HGBUR NEGATIVE  BILIRUBINUR NEGATIVE  KETONESUR NEGATIVE  PROTEINUR NEGATIVE  NITRITE NEGATIVE  LEUKOCYTESUR TRACE*      Imaging: US Venous Img Upper Uni Left  Result Date: 06/05/2019 CLINICAL DATA:  Left upper extremity edema. EXAM: LEFT UPPER EXTREMITY VENOUS DOPPLER ULTRASOUND TECHNIQUE: Gray-scale sonography with graded compression, as well as color Doppler and duplex ultrasound were performed to evaluate the upper extremity deep venous system from the level of the subclavian vein and including the jugular, axillary, basilic, radial, ulnar and upper cephalic vein. Spectral Doppler was utilized to evaluate flow at rest and with distal augmentation maneuvers. COMPARISON:  None. FINDINGS: Contralateral Subclavian Vein: Respiratory phasicity is normal and symmetric with the symptomatic side. No evidence of thrombus. Normal compressibility. Internal Jugular Vein: No evidence of thrombus. Normal compressibility, respiratory phasicity and response to augmentation. Subclavian Vein: No evidence of thrombus. Normal compressibility, respiratory phasicity and response to augmentation. Axillary Vein: No evidence of thrombus. Normal compressibility, respiratory phasicity and response to augmentation. Cephalic Vein: No evidence of thrombus. Normal compressibility, respiratory phasicity and response to augmentation. Basilic Vein: No evidence of thrombus. Normal compressibility, respiratory phasicity and response to augmentation. Brachial Veins: No evidence of thrombus. Normal compressibility, respiratory phasicity and response to augmentation. Radial Veins: No evidence of thrombus. Normal compressibility, respiratory phasicity and response to  augmentation. Ulnar Veins: No evidence of thrombus. Normal compressibility, respiratory phasicity and response to augmentation. Venous Reflux:  None visualized. Other Findings: No evidence of superficial thrombophlebitis or abnormal fluid collection. IMPRESSION: No evidence of DVT within the left upper extremity. Electronically Signed   By: Aletta Edouard M.D.   On: 06/05/2019 15:45     Medications:   . sodium chloride 75 mL/hr at 06/07/19 1100   . atorvastatin  40 mg Oral Daily  . busPIRone  10 mg Oral BID  . chlorpheniramine-HYDROcodone  5 mL Oral BID  . cholecalciferol  1,000 Units Oral Daily  . diltiazem  180 mg Oral Daily  . enoxaparin (LOVENOX) injection  40 mg Subcutaneous Q24H  .  escitalopram  5 mg Oral Daily  . ferrous sulfate  325 mg Oral Q breakfast  . hydrALAZINE  50 mg Oral TID  . influenza vaccine adjuvanted  0.5 mL Intramuscular Tomorrow-1000  . levothyroxine  50 mcg Oral QAC breakfast  . loratadine  10 mg Oral Daily  . losartan  100 mg Oral QHS  . magnesium oxide  400 mg Oral BID  . meloxicam  15 mg Oral Daily  . metoprolol tartrate  25 mg Oral BID  . mometasone-formoterol  2 puff Inhalation BID  . pantoprazole  40 mg Oral Daily  . polyethylene glycol  17 g Oral Daily  . sodium chloride  1 g Oral BID WC   acetaminophen **OR** acetaminophen, albuterol, ALPRAZolam, alum & mag hydroxide-simeth, HYDROcodone-acetaminophen, meclizine, metoprolol tartrate, ondansetron **OR** ondansetron (ZOFRAN) IV, senna-docusate  Assessment/ Plan:  Ms. Janice Perkins is a 85 y.o. white female with breast cancer, atrial fibrillation, COPD, hypertension who is admitted with hyponatremia.   1. Hyponatremia: acute on chronic: history is consistent with Gi losses and poor PO intake Baseline sodium 130-135 Doubt that one dose of venlafaxine would have caused this. - Continue IV saline infusion - PO salt tabs.  - change to regular diet to prevent salt restriction.   2. Hypertension with  atrial fibrillation - metoprolol, losartan, hydralazine, and diltiazem.    LOS: 3 Kiron Osmun 10/21/202012:10 PM

## 2019-06-07 NOTE — Care Management Important Message (Signed)
Important Message  Patient Details  Name: Janice Perkins MRN: [id number] Date of Birth: 1933-05-29   Medicare Important Message Given:  Yes     [name] 06/07/2019, 10:49 AM

## 2019-06-08 LAB — BASIC METABOLIC PANEL
Anion gap: 10 (ref 5–15)
BUN: 19 mg/dL (ref 8–23)
CO2: 22 mmol/L (ref 22–32)
Calcium: 8.4 mg/dL — ABNORMAL LOW (ref 8.9–10.3)
Chloride: 98 mmol/L (ref 98–111)
Creatinine, Ser: 0.8 mg/dL (ref 0.44–1.00)
GFR calc Af Amer: >60 mL/min (ref >60)
GFR calc non Af Amer: >60 mL/min (ref >60)
Glucose, Bld: 123 mg/dL — ABNORMAL HIGH (ref 70–99)
Potassium: 4 mmol/L (ref 3.5–5.1)
Sodium: 130 mmol/L — ABNORMAL LOW (ref 135–145)

## 2019-06-08 MED ORDER — ALUM & MAG HYDROXIDE-SIMETH 200-200-20 MG/5ML PO SUSP: 30.0000 mL | ORAL | 0 refills | Status: AC | PRN

## 2019-06-08 NOTE — Progress Notes (Signed)
Discharge summary reviewed with verbal understanding. Escorted to personal vehicle. 

## 2019-06-08 NOTE — Progress Notes (Signed)
[facility]  ROUNDING NOTE   Subjective:   Na 130  Complains of shoulder pain.   Objective:  Vital signs in last 24 hours:  Temp:  [98 F (36.7 C)-99 F (37.2 C)] 98 F (36.7 C) (10/22 0745) Pulse Rate:  [55-84] 84 (10/22 0745) Resp:  [17-18] 17 (10/22 0745) BP: (112-155)/(44-95) 155/95 (10/22 0745) SpO2:  [94 %-98 %] 98 % (10/22 0745)  Weight change:  Filed Weights   06/04/19 1159 06/04/19 1635  Weight: 72.1 kg 73.5 kg    Intake/Output: I/O last 3 completed shifts: In: 3394.6 [P.O.:240; I.V.:3154.6] Out: -    Intake/Output this shift:  Total I/O In: 120 [P.O.:120] Out: -   Physical Exam: General: NAD,   Head: Normocephalic, atraumatic. Moist oral mucosal membranes  Eyes: Anicteric, PERRL  Neck: Supple, trachea midline  Lungs:  Clear to auscultation  Heart: Regular rate and rhythm  Abdomen:  Soft, nontender,   Extremities:  no peripheral edema.  Neurologic: Nonfocal, moving all four extremities  Skin: No lesions        Basic Metabolic Panel: Recent Labs  Lab 06/04/19 1223  06/05/19 0416  06/06/19 0849  06/06/19 2104 06/07/19 0051 06/07/19 0357 06/07/19 0848 06/08/19 0416  NA 120*   < > 127*   < > 130*   < > 131* 131* 131* 128* 130*  K 4.8  --  4.5  --  4.1  --   --   --  4.4  --  4.0  CL 85*  --  94*  --  96*  --   --   --  99  --  98  CO2 25  --  23  --  24  --   --   --  23  --  22  GLUCOSE 86  --  106*  --  158*  --   --   --  106*  --  123*  BUN 19  --  14  --  13  --   --   --  17  --  19  CREATININE 0.88  --  0.85  --  0.87  --   --   --  0.94  --  0.80  CALCIUM 9.0  --  8.4*  --  9.1  --   --   --  8.6*  --  8.4*   < > = values in this interval not displayed.    Liver Function Tests: Recent Labs  Lab 06/04/19 1223  AST 26  ALT 17  ALKPHOS 54  BILITOT 0.9  PROT 7.2  ALBUMIN 3.8   Recent Labs  Lab 06/04/19 1223  LIPASE 25   No results for input(s): AMMONIA in the last 168 hours.  CBC: Recent Labs  Lab  06/04/19 1223 06/05/19 0416  WBC 9.7 8.0  HGB 13.2 12.0  HCT 38.5 34.4*  MCV 85.6 85.4  PLT 232 183    Cardiac Enzymes: No results for input(s): CKTOTAL, CKMB, CKMBINDEX, TROPONINI in the last 168 hours.  BNP: Invalid input(s): POCBNP  CBG: Recent Labs  Lab 06/06/19 [id number]    Microbiology: Results for orders placed or performed during the hospital encounter of 06/04/19  SARS CORONAVIRUS 2 (TAT 6-24 HRS) Nasopharyngeal Nasopharyngeal Swab     Status: None   Collection Time: 06/04/19  3:22 PM   Specimen: Nasopharyngeal Swab  Result Value Ref Range Status   SARS Coronavirus 2 NEGATIVE NEGATIVE Final    Comment: (NOTE)  SARS-CoV-2 target nucleic acids are NOT DETECTED. The SARS-CoV-2 RNA is generally detectable in upper and lower respiratory specimens during the acute phase of infection. Negative results do not preclude SARS-CoV-2 infection, do not rule out co-infections with other pathogens, and should not be used as the sole basis for treatment or other patient management decisions. Negative results must be combined with clinical observations, patient history, and epidemiological information. The expected result is Negative. Fact Sheet for Patients: SugarRoll.be Fact Sheet for Healthcare Providers: https://www.woods-mathews.com/ This test is not yet approved or cleared by the [place] FDA and  has been authorized for detection and/or diagnosis of SARS-CoV-2 by FDA under an Emergency Use Authorization (EUA). This EUA will remain  in effect (meaning this test can be used) for the duration of the COVID-19 declaration under Section 56 4(b)(1) of the Act, 21 U.S.C. section 360bbb-3(b)(1), unless the authorization is terminated or revoked sooner. Performed at [facility], [place] [address], [place], [place] [address]     Coagulation Studies: No results for input(s): LABPROT, INR in the last 72  hours.  Urinalysis: No results for input(s): COLORURINE, LABSPEC, PHURINE, GLUCOSEU, HGBUR, BILIRUBINUR, KETONESUR, PROTEINUR, UROBILINOGEN, NITRITE, LEUKOCYTESUR in the last 72 hours.  Invalid input(s): APPERANCEUR    Imaging: No results found.   Medications:    . atorvastatin  40 mg Oral Daily  . busPIRone  10 mg Oral BID  . chlorpheniramine-HYDROcodone  5 mL Oral BID  . cholecalciferol  1,000 Units Oral Daily  . diltiazem  180 mg Oral Daily  . enoxaparin (LOVENOX) injection  40 mg Subcutaneous Q24H  . escitalopram  5 mg Oral Daily  . ferrous sulfate  325 mg Oral Q breakfast  . hydrALAZINE  50 mg Oral TID  . levothyroxine  50 mcg Oral QAC breakfast  . loratadine  10 mg Oral Daily  . losartan  100 mg Oral QHS  . magnesium oxide  400 mg Oral BID  . meloxicam  15 mg Oral Daily  . metoprolol tartrate  25 mg Oral BID  . mometasone-formoterol  2 puff Inhalation BID  . pantoprazole  40 mg Oral Daily  . polyethylene glycol  17 g Oral Daily  . sodium chloride  1 g Oral BID WC   acetaminophen **OR** acetaminophen, albuterol, ALPRAZolam, alum & mag hydroxide-simeth, HYDROcodone-acetaminophen, meclizine, metoprolol tartrate, ondansetron **OR** ondansetron (ZOFRAN) IV, senna-docusate  Assessment/ Plan:  Ms. Janice Perkins is a 85 y.o. white female with breast cancer, atrial fibrillation, COPD, hypertension who is admitted with hyponatremia.   1. Hyponatremia: acute on chronic: history is consistent with Gi losses and poor PO intake Baseline sodium 130-135 Doubt that one dose of venlafaxine would have caused this. - PO salt tabs.  - regular  2. Hypertension with atrial fibrillation - metoprolol, losartan, hydralazine, and diltiazem.    LOS: 4 Vesta Wheeland 10/22/202010:35 AM

## 2019-06-08 NOTE — Discharge Instructions (Signed)
HHPT °

## 2019-06-08 NOTE — TOC Transition Note (Signed)
Transition of Care [facility]) - CM/SW Discharge Note   Patient Details  Name: Janice Perkins MRN: [id number] Date of Birth: October 31, 1932  [facility] CM/SW Contact:  Ysabela Keisler, Lenice Llamas Phone Number: [phone]  06/08/2019, 11:29 AM   Clinical Narrative: Clinical Social Worker (CSW) notified [place] representative that patient will D/C home today. MD put in home health orders for PT, RN and social work. Patient has chronic oxygen at night through [facility]. Patient has no DME needs. Please reconsult if future social work needs arise. CSW signing off.       Final next level of care: [place] Barriers to Discharge: Barriers Resolved   Patient Goals and CMS Choice Patient states their goals for this hospitalization and ongoing recovery are:: To go home.      Discharge Placement                       Discharge Plan and Services In-house Referral: Clinical Social Work   Post Acute Care Choice: Home Health          DME Arranged: N/A         HH Arranged: PT, RN, Social Work [employer] Agency: Microbiologist ([place]) Date [place]: 06/08/19 Time [place]: 1129 Representative spoke with at [place]: [place] ([place]) Interventions     Readmission Risk Interventions Readmission Risk Prevention Plan 06/05/2019 06/01/2019 04/09/2019  Transportation Screening Complete Complete Complete  Medication Review [employer]) Referral to Pharmacy Complete Complete  PCP or Specialist appointment within 3-5 days of discharge (No Data) Complete Patient refused  PCP/Specialist Appt Not Complete comments - - -  HRI or Home Care Consult Complete - Patient refused  SW Recovery Care/Counseling Consult Not Complete - Patient refused  SW Consult Not Complete Comments NA - -  Palliative Care Screening Not Applicable Not Applicable Not [place] Not Applicable  Not Applicable Not Applicable  Some recent data might be hidden

## 2019-06-08 NOTE — Discharge Summary (Signed)
[facility] NAME: Janice Perkins    MR#:  [id number]  DATE OF BIRTH:  1932-09-27  DATE OF ADMISSION:  06/04/2019   ADMITTING PHYSICIAN: Saundra Shelling, MD  DATE OF DISCHARGE: 06/08/2019  PRIMARY CARE PHYSICIAN: [name], MD   ADMISSION DIAGNOSIS:  Hyponatremia [E87.1] Nausea [R11.0] DISCHARGE DIAGNOSIS:  Active Problems:   Hyponatremia  SECONDARY DIAGNOSIS:   Past Medical History:  Diagnosis Date  . Asthma   . Atrial fibrillation ([place])   . Breast cancer ([place]) 2011  . Bronchitis 04/2015  . Cancer [facility]) 2011   breast  . COPD (chronic obstructive pulmonary disease) ([place])   . Hypertension   . Shingles    10/2015   HOSPITAL COURSE:  85 year old female patientwith a known history ofbreast cancer, chronic atrial fibrillation, bronchitis, COPD, hypertension presented to the emergency room for nausea patient felt nauseous and was about to throw up.  -Acute hyponatremia Improving with IV hydration with normal saline Improved to baseline 130 with normal saline IV, p.o. salt tablets.  -Dehydration Hydration with normal saline  -History of breast cancer Supportive care  -Hyperlipidemia Continue statin medication  -COPD Stable continue home dose inhalers  -DVT prophylaxis subcu Lovenox daily  -Swelling in left antecubital area No evidence of DVT within the left upper extremity per ultrasound.  I discussed with Dr. Juleen China. Generalized weakness.  Home health and PT. DISCHARGE CONDITIONS:  Stable, discharge to home with home health today. CONSULTS OBTAINED:   DRUG ALLERGIES:   Allergies  Allergen Reactions  . Diphenhydramine Hcl Rash  . Penicillins Hives    .Has patient had a PCN reaction causing immediate rash, facial/tongue/throat swelling, SOB or lightheadedness with hypotension: Unknown Has patient had a PCN reaction causing severe rash involving mucus membranes or skin necrosis: Unknown  Has patient had a PCN reaction that required hospitalization: Unknown Has patient had a PCN reaction occurring within the last 10 years: Unknown If all of the above answers are "NO", then may proceed with Cephalosporin use.   . Captopril Other (See Comments)  . Eliquis [Apixaban] Other (See Comments)    Stomach bleed  . Enalapril Maleate Other (See Comments)    Other reaction(s): Unknown  . Tape Itching  . Terfenadine Other (See Comments)  . Augmentin [Amoxicillin-Pot Clavulanate] Rash    Has patient had a PCN reaction causing immediate rash, facial/tongue/throat swelling, SOB or lightheadedness with hypotension: Unknown Has patient had a PCN reaction causing severe rash involving mucus membranes or skin necrosis: Unknown Has patient had a PCN reaction that required hospitalization: Unknown Has patient had a PCN reaction occurring within the last 10 years: Unknown If all of the above answers are "NO", then may proceed with Cephalosporin use.  . Biaxin [Clarithromycin] Rash, Other (See Comments) and Hives    Other reaction(s): Unknown   DISCHARGE MEDICATIONS:   Allergies as of 06/08/2019      Reactions   Diphenhydramine Hcl Rash   Penicillins Hives   .Has patient had a PCN reaction causing immediate rash, facial/tongue/throat swelling, SOB or lightheadedness with hypotension: Unknown Has patient had a PCN reaction causing severe rash involving mucus membranes or skin necrosis: Unknown Has patient had a PCN reaction that required hospitalization: Unknown Has patient had a PCN reaction occurring within the last 10 years: Unknown If all of the above answers are "NO", then may proceed with Cephalosporin use.   Captopril Other (See Comments)   Eliquis [apixaban] Other (See Comments)  Stomach bleed   Enalapril Maleate Other (See Comments)   Other reaction(s): Unknown   Tape Itching   Terfenadine Other (See Comments)   Augmentin [amoxicillin-pot Clavulanate] Rash   Has patient had a  PCN reaction causing immediate rash, facial/tongue/throat swelling, SOB or lightheadedness with hypotension: Unknown Has patient had a PCN reaction causing severe rash involving mucus membranes or skin necrosis: Unknown Has patient had a PCN reaction that required hospitalization: Unknown Has patient had a PCN reaction occurring within the last 10 years: Unknown If all of the above answers are "NO", then may proceed with Cephalosporin use.   Biaxin [clarithromycin] Rash, Other (See Comments), Hives   Other reaction(s): Unknown      Medication List    STOP taking these medications   azithromycin 250 MG tablet Commonly known as: Zithromax Z-Pak     TAKE these medications   albuterol 108 (90 Base) MCG/ACT inhaler Commonly known as: VENTOLIN HFA Inhale 2 puffs into the lungs every 6 (six) hours as needed for wheezing or shortness of breath.   albuterol 1.25 MG/3ML nebulizer solution Commonly known as: ACCUNEB Inhale 3 mLs into the lungs every 6 (six) hours as needed for wheezing.   ALPRAZolam 0.5 MG tablet Commonly known as: XANAX Take 0.5 mg by mouth 2 (two) times a day.   alum & mag hydroxide-simeth 200-200-20 MG/5ML suspension Commonly known as: MAALOX/MYLANTA Take 30 mLs by mouth every 4 (four) hours as needed for indigestion or heartburn.   atorvastatin 40 MG tablet Commonly known as: LIPITOR Take 40 mg by mouth daily.   busPIRone 10 MG tablet Commonly known as: BUSPAR Take 10 mg by mouth 2 (two) times daily.   cholecalciferol 1000 units tablet Commonly known as: VITAMIN D Take 1,000 Units by mouth daily.   cloNIDine 0.1 MG tablet Commonly known as: CATAPRES Take 0.1 mg by mouth 2 (two) times daily as needed.   diltiazem 180 MG 24 hr capsule Commonly known as: CARDIZEM CD Take 1 capsule by mouth daily.   ferrous sulfate 325 (65 FE) MG tablet Take 325 mg by mouth daily with breakfast.   Fluticasone-Salmeterol 250-50 MCG/DOSE Aepb Commonly known as: ADVAIR  Inhale 1 puff into the lungs 2 (two) times daily.   hydrALAZINE 10 MG tablet Commonly known as: APRESOLINE Take 5 tablets (50 mg total) by mouth 3 (three) times daily.   HYDROcodone-acetaminophen 5-325 MG tablet Commonly known as: NORCO/VICODIN Take 1 tablet by mouth 2 (two) times daily.   levothyroxine 50 MCG tablet Commonly known as: SYNTHROID Take 50 mcg by mouth daily before breakfast.   loratadine 10 MG tablet Commonly known as: CLARITIN Take 10 mg by mouth daily.   losartan 50 MG tablet Commonly known as: COZAAR Take 2 tablets (100 mg total) by mouth at bedtime.   magnesium oxide 400 MG tablet Commonly known as: MAG-OX Take 400 mg by mouth 2 (two) times daily.   meclizine 25 MG tablet Commonly known as: ANTIVERT Take 1 tablet (25 mg total) by mouth 2 (two) times daily as needed for dizziness.   meloxicam 15 MG tablet Commonly known as: MOBIC Take 15 mg by mouth daily.   mometasone-formoterol 100-5 MCG/ACT Aero Commonly known as: DULERA Inhale 2 puffs into the lungs 2 (two) times daily.   ondansetron 4 MG disintegrating tablet Commonly known as: ZOFRAN-ODT Take 1 tablet by mouth every 8 (eight) hours as needed.   polyethylene glycol 17 g packet Commonly known as: MIRALAX / GLYCOLAX Take 17 g by mouth  daily.   RABEprazole 20 MG tablet Commonly known as: ACIPHEX Take 20 mg by mouth 2 (two) times daily.   venlafaxine XR 75 MG 24 hr capsule Commonly known as: EFFEXOR-XR Take 75 mg by mouth daily.   venlafaxine 37.5 MG tablet Commonly known as: EFFEXOR Take 37.5 mg by mouth daily.        DISCHARGE INSTRUCTIONS:  See AVS. If you experience worsening of your admission symptoms, develop shortness of breath, life threatening emergency, suicidal or homicidal thoughts you must seek medical attention immediately by calling 911 or calling your MD immediately  if symptoms less severe.  You Must read complete instructions/literature along with all the possible  adverse reactions/side effects for all the Medicines you take and that have been prescribed to you. Take any new Medicines after you have completely understood and accpet all the possible adverse reactions/side effects.   Please note  You were cared for by a hospitalist during your hospital stay. If you have any questions about your discharge medications or the care you received while you were in the hospital after you are discharged, you can call the unit and asked to speak with the hospitalist on call if the hospitalist that took care of you is not available. Once you are discharged, your primary care physician will handle any further medical issues. Please note that NO REFILLS for any discharge medications will be authorized once you are discharged, as it is imperative that you return to your primary care physician (or establish a relationship with a primary care physician if you do not have one) for your aftercare needs so that they can reassess your need for medications and monitor your lab values.    On the day of Discharge:  VITAL SIGNS:  Blood pressure (!) 155/95, pulse 84, temperature 98 F (36.7 C), temperature source Oral, resp. rate 17, height 5' (1.524 m), weight 73.5 kg, SpO2 98 %. PHYSICAL EXAMINATION:  GENERAL:  85 y.o.-year-old patient lying in the bed with no acute distress.  EYES: Pupils equal, round, reactive to light and accommodation. No scleral icterus. Extraocular muscles intact.  HEENT: Head atraumatic, normocephalic. NECK:  Supple, no jugular venous distention. No thyroid enlargement, no tenderness.  LUNGS: Normal breath sounds bilaterally, no wheezing, rales,rhonchi or crepitation. No use of accessory muscles of respiration.  CARDIOVASCULAR: S1, S2 normal. No murmurs, rubs, or gallops.  ABDOMEN: Soft, non-tender, non-distended. Bowel sounds present. No organomegaly or mass.  EXTREMITIES: No pedal edema, cyanosis, or clubbing.  NEUROLOGIC: Cranial nerves II through XII  are intact. Muscle strength 5/5 in all extremities. Sensation intact. Gait not checked.  PSYCHIATRIC: The patient is alert and oriented x 3.  SKIN: No obvious rash, lesion, or ulcer.  DATA REVIEW:   CBC Recent Labs  Lab 06/05/19 0416  WBC 8.0  HGB 12.0  HCT 34.4*  PLT 183    Chemistries  Recent Labs  Lab 06/04/19 1223  06/08/19 0416  NA 120*   < > 130*  K 4.8   < > 4.0  CL 85*   < > 98  CO2 25   < > 22  GLUCOSE 86   < > 123*  BUN 19   < > 19  CREATININE 0.88   < > 0.80  CALCIUM 9.0   < > 8.4*  AST 26  --   --   ALT 17  --   --   ALKPHOS 54  --   --   BILITOT 0.9  --   --    < > =  values in this interval not displayed.     Microbiology Results  Results for orders placed or performed during the hospital encounter of 06/04/19  SARS CORONAVIRUS 2 (TAT 6-24 HRS) Nasopharyngeal Nasopharyngeal Swab     Status: None   Collection Time: 06/04/19  3:22 PM   Specimen: Nasopharyngeal Swab  Result Value Ref Range Status   SARS Coronavirus 2 NEGATIVE NEGATIVE Final    Comment: (NOTE) SARS-CoV-2 target nucleic acids are NOT DETECTED. The SARS-CoV-2 RNA is generally detectable in upper and lower respiratory specimens during the acute phase of infection. Negative results do not preclude SARS-CoV-2 infection, do not rule out co-infections with other pathogens, and should not be used as the sole basis for treatment or other patient management decisions. Negative results must be combined with clinical observations, patient history, and epidemiological information. The expected result is Negative. Fact Sheet for Patients: SugarRoll.be Fact Sheet for Healthcare Providers: https://www.woods-mathews.com/ This test is not yet approved or cleared by the [place] FDA and  has been authorized for detection and/or diagnosis of SARS-CoV-2 by FDA under an Emergency Use Authorization (EUA). This EUA will remain  in effect (meaning this test can  be used) for the duration of the COVID-19 declaration under Section 56 4(b)(1) of the Act, 21 U.S.C. section 360bbb-3(b)(1), unless the authorization is terminated or revoked sooner. Performed at [facility], [place] [address], [place], [place] [address]     RADIOLOGY:  No results found.   Management plans discussed with the patient, family and they are in agreement.  CODE STATUS: Full Code   TOTAL TIME TAKING CARE OF THIS PATIENT: 33 minutes.    Demetrios Loll M.D on 06/08/2019 at 10:19 AM  Between 7am to 6pm - Pager - [phone]  After 6pm go to [email or web address] - Proofreader  [facility]  Office  [phone]  CC: Primary care physician; [name], MD   Note: This dictation was prepared with Dragon dictation along with smaller phrase technology. Any transcriptional errors that result from this process are unintentional.

## 2019-06-09 ENCOUNTER — Emergency Department: Payer: Medicare Other

## 2019-06-09 ENCOUNTER — Other Ambulatory Visit: Payer: Self-pay

## 2019-06-09 ENCOUNTER — Inpatient Hospital Stay
Admission: EM | Admit: 2019-06-09 | Discharge: 2019-06-10 | DRG: 292 | Disposition: A | Discharge status: Home Health | Payer: Medicare Other | Attending: Internal Medicine | Admitting: Internal Medicine

## 2019-06-09 DIAGNOSIS — Z853 Personal history of malignant neoplasm of breast: Secondary | ICD-10-CM | POA: Diagnosis not present

## 2019-06-09 DIAGNOSIS — Z9981 Dependence on supplemental oxygen: Secondary | ICD-10-CM | POA: Diagnosis not present

## 2019-06-09 DIAGNOSIS — Z823 Family history of stroke: Secondary | ICD-10-CM | POA: Diagnosis not present

## 2019-06-09 DIAGNOSIS — Z20828 Contact with and (suspected) exposure to other viral communicable diseases: Secondary | ICD-10-CM | POA: Diagnosis present

## 2019-06-09 DIAGNOSIS — E8770 Fluid overload, unspecified: Secondary | ICD-10-CM | POA: Diagnosis present

## 2019-06-09 DIAGNOSIS — J449 Chronic obstructive pulmonary disease, unspecified: Secondary | ICD-10-CM | POA: Diagnosis present

## 2019-06-09 DIAGNOSIS — I110 Hypertensive heart disease with heart failure: Secondary | ICD-10-CM | POA: Diagnosis present

## 2019-06-09 DIAGNOSIS — Z803 Family history of malignant neoplasm of breast: Secondary | ICD-10-CM

## 2019-06-09 DIAGNOSIS — Z881 Allergy status to other antibiotic agents status: Secondary | ICD-10-CM | POA: Diagnosis not present

## 2019-06-09 DIAGNOSIS — Z880 Allergy status to penicillin: Secondary | ICD-10-CM

## 2019-06-09 DIAGNOSIS — R519 Headache, unspecified: Secondary | ICD-10-CM | POA: Diagnosis present

## 2019-06-09 DIAGNOSIS — E039 Hypothyroidism, unspecified: Secondary | ICD-10-CM | POA: Diagnosis present

## 2019-06-09 DIAGNOSIS — Z91048 Other nonmedicinal substance allergy status: Secondary | ICD-10-CM | POA: Diagnosis not present

## 2019-06-09 DIAGNOSIS — Z888 Allergy status to other drugs, medicaments and biological substances status: Secondary | ICD-10-CM | POA: Diagnosis not present

## 2019-06-09 DIAGNOSIS — E871 Hypo-osmolality and hyponatremia: Secondary | ICD-10-CM | POA: Diagnosis present

## 2019-06-09 DIAGNOSIS — Z7951 Long term (current) use of inhaled steroids: Secondary | ICD-10-CM | POA: Diagnosis not present

## 2019-06-09 DIAGNOSIS — Z791 Long term (current) use of non-steroidal anti-inflammatories (NSAID): Secondary | ICD-10-CM

## 2019-06-09 DIAGNOSIS — I482 Chronic atrial fibrillation, unspecified: Secondary | ICD-10-CM | POA: Diagnosis present

## 2019-06-09 DIAGNOSIS — E785 Hyperlipidemia, unspecified: Secondary | ICD-10-CM | POA: Diagnosis not present

## 2019-06-09 DIAGNOSIS — Z79899 Other long term (current) drug therapy: Secondary | ICD-10-CM | POA: Diagnosis not present

## 2019-06-09 DIAGNOSIS — Z79890 Hormone replacement therapy (postmenopausal): Secondary | ICD-10-CM | POA: Diagnosis not present

## 2019-06-09 DIAGNOSIS — I10 Essential (primary) hypertension: Secondary | ICD-10-CM

## 2019-06-09 DIAGNOSIS — J810 Acute pulmonary edema: Secondary | ICD-10-CM

## 2019-06-09 DIAGNOSIS — I5033 Acute on chronic diastolic (congestive) heart failure: Secondary | ICD-10-CM

## 2019-06-09 DIAGNOSIS — Z8249 Family history of ischemic heart disease and other diseases of the circulatory system: Secondary | ICD-10-CM | POA: Diagnosis not present

## 2019-06-09 LAB — CBC WITH DIFFERENTIAL/PLATELET
Abs Immature Granulocytes: 0.04 10*3/uL (ref 0.00–0.07)
Basophils Absolute: 0 10*3/uL (ref 0.0–0.1)
Basophils Relative: 1 %
Eosinophils Absolute: 0.8 10*3/uL — ABNORMAL HIGH (ref 0.0–0.5)
Eosinophils Relative: 11 %
HCT: 34 % — ABNORMAL LOW (ref 36.0–46.0)
Hemoglobin: 11.4 g/dL — ABNORMAL LOW (ref 12.0–15.0)
Immature Granulocytes: 1 %
Lymphocytes Relative: 9 %
Lymphs Abs: 0.6 10*3/uL — ABNORMAL LOW (ref 0.7–4.0)
MCH: 29.5 pg (ref 26.0–34.0)
MCHC: 33.5 g/dL (ref 30.0–36.0)
MCV: 87.9 fL (ref 80.0–100.0)
Monocytes Absolute: 0.6 10*3/uL (ref 0.1–1.0)
Monocytes Relative: 9 %
Neutro Abs: 5.2 10*3/uL (ref 1.7–7.7)
Neutrophils Relative %: 69 %
Platelets: 155 10*3/uL (ref 150–400)
RBC: 3.87 MIL/uL (ref 3.87–5.11)
RDW: 15 % (ref 11.5–15.5)
WBC: 7.4 10*3/uL (ref 4.0–10.5)
nRBC: 0 % (ref 0.0–0.2)

## 2019-06-09 LAB — BASIC METABOLIC PANEL
Anion gap: 9 (ref 5–15)
BUN: 16 mg/dL (ref 8–23)
CO2: 24 mmol/L (ref 22–32)
Calcium: 8.9 mg/dL (ref 8.9–10.3)
Chloride: 100 mmol/L (ref 98–111)
Creatinine, Ser: 0.76 mg/dL (ref 0.44–1.00)
GFR calc Af Amer: >60 mL/min (ref >60)
GFR calc non Af Amer: >60 mL/min (ref >60)
Glucose, Bld: 123 mg/dL — ABNORMAL HIGH (ref 70–99)
Potassium: 3.9 mmol/L (ref 3.5–5.1)
Sodium: 133 mmol/L — ABNORMAL LOW (ref 135–145)

## 2019-06-09 LAB — MAGNESIUM: Magnesium: 1.9 mg/dL (ref 1.7–2.4)

## 2019-06-09 LAB — TROPONIN I (HIGH SENSITIVITY)
Troponin I (High Sensitivity): 39 ng/L — ABNORMAL HIGH (ref <18)
Troponin I (High Sensitivity): 35 ng/L — ABNORMAL HIGH (ref <18)

## 2019-06-09 LAB — BRAIN NATRIURETIC PEPTIDE: B Natriuretic Peptide: 610 pg/mL — ABNORMAL HIGH (ref 0.0–100.0)

## 2019-06-09 MED ORDER — MECLIZINE HCL 25 MG PO TABS
25.0000 mg | ORAL_TABLET | Freq: Two times a day (BID) | ORAL | Status: DC | PRN
  Administered 2019-06-09: 25 mg via ORAL
  Filled 2019-06-09 (×2): qty 1

## 2019-06-09 MED ORDER — ALUM & MAG HYDROXIDE-SIMETH 200-200-20 MG/5ML PO SUSP: 30.0000 mL | ORAL | Status: DC | PRN

## 2019-06-09 MED ORDER — SODIUM CHLORIDE 0.9% FLUSH
3.0000 mL | Freq: Two times a day (BID) | INTRAVENOUS | Status: DC
  Administered 2019-06-09 – 2019-06-10 (×2): 3 mL via INTRAVENOUS

## 2019-06-09 MED ORDER — SODIUM CHLORIDE 0.9% FLUSH: 3.0000 mL | INTRAVENOUS | Status: DC | PRN

## 2019-06-09 MED ORDER — ACETAMINOPHEN 650 MG RE SUPP
650.0000 mg | Freq: Four times a day (QID) | RECTAL | Status: DC | PRN
  Filled 2019-06-09: qty 1

## 2019-06-09 MED ORDER — SODIUM CHLORIDE 0.9 % IV SOLN: 250.0000 mL | INTRAVENOUS | Status: DC | PRN

## 2019-06-09 MED ORDER — FUROSEMIDE 40 MG PO TABS
20.0000 mg | ORAL_TABLET | Freq: Once | ORAL | Status: AC
  Administered 2019-06-09: 20 mg via ORAL
  Filled 2019-06-09: qty 1

## 2019-06-09 MED ORDER — ONDANSETRON HCL 4 MG/2ML IJ SOLN: 4.0000 mg | Freq: Four times a day (QID) | INTRAMUSCULAR | Status: DC | PRN

## 2019-06-09 MED ORDER — HYDRALAZINE HCL 50 MG PO TABS
50.0000 mg | ORAL_TABLET | Freq: Three times a day (TID) | ORAL | Status: DC
  Administered 2019-06-09 – 2019-06-10 (×3): 50 mg via ORAL
  Filled 2019-06-09 (×5): qty 1

## 2019-06-09 MED ORDER — BISACODYL 5 MG PO TBEC: 5.0000 mg | DELAYED_RELEASE_TABLET | Freq: Every day | ORAL | Status: DC | PRN

## 2019-06-09 MED ORDER — ATORVASTATIN CALCIUM 20 MG PO TABS
40.0000 mg | ORAL_TABLET | Freq: Every day | ORAL | Status: DC
  Administered 2019-06-09 – 2019-06-10 (×2): 40 mg via ORAL
  Filled 2019-06-09 (×3): qty 2

## 2019-06-09 MED ORDER — HYDROCODONE-ACETAMINOPHEN 5-325 MG PO TABS
1.0000 | ORAL_TABLET | ORAL | Status: DC | PRN
  Administered 2019-06-10: 1 via ORAL
  Filled 2019-06-09: qty 2

## 2019-06-09 MED ORDER — POLYETHYLENE GLYCOL 3350 17 G PO PACK
17.0000 g | PACK | Freq: Every day | ORAL | Status: DC
  Filled 2019-06-09 (×2): qty 1

## 2019-06-09 MED ORDER — VENLAFAXINE HCL ER 75 MG PO CP24
75.0000 mg | ORAL_CAPSULE | Freq: Every day | ORAL | Status: DC
  Administered 2019-06-10: 75 mg via ORAL
  Filled 2019-06-09 (×2): qty 1

## 2019-06-09 MED ORDER — PANTOPRAZOLE SODIUM 40 MG PO TBEC
40.0000 mg | DELAYED_RELEASE_TABLET | Freq: Every day | ORAL | Status: DC
  Administered 2019-06-09 – 2019-06-10 (×2): 40 mg via ORAL
  Filled 2019-06-09 (×2): qty 1

## 2019-06-09 MED ORDER — SENNOSIDES-DOCUSATE SODIUM 8.6-50 MG PO TABS: 1.0000 | ORAL_TABLET | Freq: Every evening | ORAL | Status: DC | PRN

## 2019-06-09 MED ORDER — ACETAMINOPHEN 325 MG PO TABS: 650.0000 mg | ORAL_TABLET | Freq: Four times a day (QID) | ORAL | Status: DC | PRN

## 2019-06-09 MED ORDER — VENLAFAXINE HCL 37.5 MG PO TABS
37.5000 mg | ORAL_TABLET | Freq: Every day | ORAL | Status: DC
  Administered 2019-06-10: 37.5 mg via ORAL
  Filled 2019-06-09 (×2): qty 1

## 2019-06-09 MED ORDER — MAGNESIUM OXIDE 400 (241.3 MG) MG PO TABS
400.0000 mg | ORAL_TABLET | Freq: Two times a day (BID) | ORAL | Status: DC
  Administered 2019-06-09 – 2019-06-10 (×2): 400 mg via ORAL
  Filled 2019-06-09 (×2): qty 1

## 2019-06-09 MED ORDER — LOSARTAN POTASSIUM 50 MG PO TABS
100.0000 mg | ORAL_TABLET | Freq: Every day | ORAL | Status: DC
  Administered 2019-06-09: 100 mg via ORAL
  Filled 2019-06-09: qty 2

## 2019-06-09 MED ORDER — ONDANSETRON HCL 4 MG PO TABS
4.0000 mg | ORAL_TABLET | Freq: Four times a day (QID) | ORAL | Status: DC | PRN
  Filled 2019-06-09: qty 1

## 2019-06-09 MED ORDER — FUROSEMIDE 20 MG PO TABS
20.0000 mg | ORAL_TABLET | Freq: Every day | ORAL | Status: DC
  Administered 2019-06-09: 20 mg via ORAL
  Filled 2019-06-09 (×2): qty 1

## 2019-06-09 MED ORDER — GUAIFENESIN 100 MG/5ML PO SOLN
5.0000 mL | ORAL | Status: DC | PRN
  Filled 2019-06-09: qty 5

## 2019-06-09 MED ORDER — FUROSEMIDE 20 MG PO TABS: 20.0000 mg | ORAL_TABLET | Freq: Every day | ORAL | 11 refills | Status: DC

## 2019-06-09 MED ORDER — LORATADINE 10 MG PO TABS
10.0000 mg | ORAL_TABLET | Freq: Every day | ORAL | Status: DC
  Administered 2019-06-09 – 2019-06-10 (×2): 10 mg via ORAL
  Filled 2019-06-09 (×3): qty 1

## 2019-06-09 MED ORDER — ENOXAPARIN SODIUM 40 MG/0.4ML ~~LOC~~ SOLN
40.0000 mg | SUBCUTANEOUS | Status: DC
  Administered 2019-06-09: 40 mg via SUBCUTANEOUS
  Filled 2019-06-09: qty 0.4

## 2019-06-09 MED ORDER — METOPROLOL TARTRATE 5 MG/5ML IV SOLN
2.5000 mg | INTRAVENOUS | Status: DC | PRN
  Filled 2019-06-09: qty 5

## 2019-06-09 MED ORDER — DILTIAZEM HCL ER COATED BEADS 180 MG PO CP24
180.0000 mg | ORAL_CAPSULE | Freq: Every day | ORAL | Status: DC
  Administered 2019-06-09 – 2019-06-10 (×2): 180 mg via ORAL
  Filled 2019-06-09 (×3): qty 1

## 2019-06-09 MED ORDER — VITAMIN D3 25 MCG (1000 UNIT) PO TABS
1000.0000 [IU] | ORAL_TABLET | Freq: Every day | ORAL | Status: DC
  Administered 2019-06-09 – 2019-06-10 (×2): 1000 [IU] via ORAL
  Filled 2019-06-09 (×4): qty 1

## 2019-06-09 MED ORDER — ALPRAZOLAM 0.5 MG PO TABS
0.5000 mg | ORAL_TABLET | Freq: Two times a day (BID) | ORAL | Status: DC
  Administered 2019-06-09 – 2019-06-10 (×2): 0.5 mg via ORAL
  Filled 2019-06-09 (×2): qty 1

## 2019-06-09 MED ORDER — ALBUTEROL SULFATE (2.5 MG/3ML) 0.083% IN NEBU: 2.5000 mg | INHALATION_SOLUTION | RESPIRATORY_TRACT | Status: DC | PRN

## 2019-06-09 MED ORDER — FUROSEMIDE 10 MG/ML IJ SOLN
20.0000 mg | Freq: Once | INTRAMUSCULAR | Status: AC
  Administered 2019-06-09: 20 mg via INTRAVENOUS
  Filled 2019-06-09: qty 4

## 2019-06-09 MED ORDER — BUSPIRONE HCL 10 MG PO TABS
10.0000 mg | ORAL_TABLET | Freq: Two times a day (BID) | ORAL | Status: DC
  Administered 2019-06-10 (×2): 10 mg via ORAL
  Filled 2019-06-09 (×3): qty 1

## 2019-06-09 MED ORDER — FERROUS SULFATE 325 (65 FE) MG PO TABS
325.0000 mg | ORAL_TABLET | Freq: Every day | ORAL | Status: DC
  Administered 2019-06-10: 325 mg via ORAL
  Filled 2019-06-09: qty 1

## 2019-06-09 MED ORDER — MOMETASONE FURO-FORMOTEROL FUM 200-5 MCG/ACT IN AERO
2.0000 | INHALATION_SPRAY | Freq: Two times a day (BID) | RESPIRATORY_TRACT | Status: DC
  Administered 2019-06-10 (×2): 2 via RESPIRATORY_TRACT
  Filled 2019-06-09: qty 8.8

## 2019-06-09 MED ORDER — LEVOTHYROXINE SODIUM 50 MCG PO TABS
50.0000 ug | ORAL_TABLET | Freq: Every day | ORAL | Status: DC
  Administered 2019-06-10: 50 ug via ORAL
  Filled 2019-06-09: qty 1

## 2019-06-09 NOTE — Progress Notes (Deleted)
PHARMACIST - PHYSICIAN COMMUNICATION  CONCERNING:  Enoxaparin (Lovenox) for DVT Prophylaxis    RECOMMENDATION: Patient was prescribed enoxaprin 30mg  q24 hours for VTE prophylaxis.   There were no vitals filed for this visit.  There is no height or weight on file to calculate BMI.  Estimated Creatinine Clearance: 46.9 mL/min (by C-G formula based on SCr of 0.76 mg/dL).  Patient is candidate for enoxaparin 40mg  every 24 hours based on CrCl >71ml/min or Weight > than 45kg   DESCRIPTION: Pharmacy has adjusted enoxaparin dose per [facility] policy.   Patient is now receiving enoxaparin 40mg  every 24 hours.  Rowland Lathe, PharmD Clinical Pharmacist  06/09/2019 5:29 PM

## 2019-06-09 NOTE — Progress Notes (Signed)
Advanced Care Plan.  Purpose of Encounter: CODE STATUS. Parties in Attendance: The patient and me. Patient's Decisional Capacity: Yes. Medical Story: Janice Perkins  is a 85 y.o. female with a known history of of asthma, bronchitis, A. fib, breast cancer, COPD and hypertension.  The patient is admitted for fluid overload and possible CHF.  I discussed with patient about her current condition, prognosis and CODE STATUS.  Patient does want to be resuscitated and intubated if she has cardiopulmonary arrest. Plan:  Code Status: Full code. Time spent discussing advance care planning: 17 minutes.

## 2019-06-09 NOTE — ED Triage Notes (Signed)
Pt to ED via [facility]. Pt stated her systolic BP was [id number] and she was having blurred vision and SOB. EMS stated they got a systolic BP of [id number]. EMS states pt took a clonidine prior to their arrival. Pt has ahx of controlled afib. BG 121 and temp 97.7 orally. Upon arrival pt denies SOB or blurred vision. Pt seen Thursday for low sodium and was discharged yesterday.

## 2019-06-09 NOTE — ED Notes (Signed)
This Rn placed external catheter on pt. Admitting doctor at bedside.

## 2019-06-09 NOTE — Discharge Instructions (Addendum)
As we discussed, continue all your current medications and fluid restriction.  We are going to start you on a low-dose of Lasix, which is a water pill to decrease the amount of fluid in your body.  I have given you a dose today so you can start taking this tomorrow morning.  I recommend taking in the morning so that you do not have to pee throughout the night.  Call your kidney doctor, and follow-up within the next week as scheduled.

## 2019-06-09 NOTE — H&P (Addendum)
[facility] NAME: Janice Perkins    MR#:  [id number]  DATE OF BIRTH:  1932-08-24  DATE OF ADMISSION:  06/09/2019  PRIMARY CARE PHYSICIAN: [name], MD   REQUESTING/REFERRING PHYSICIAN: Dr. William Hamburger  CHIEF COMPLAINT:   Chief Complaint  Patient presents with  . Hypertension   High blood pressure. HISTORY OF PRESENT ILLNESS:  Janice Perkins  is a 85 y.o. female with a known history of of asthma, bronchitis, A. fib, breast cancer, COPD and hypertension.  The patient will discharge yesterday after treatment of hyponatremia.  She feels headache and facial swelling this morning.  She checked her blood pressure which was more than 200.  After she took clonidine, her blood pressure is better.  She also complains of cough and mild congestion, but no shortness of breath.  She also complains of generalized weakness and mild nausea.  Chest x-ray report Changes of mild CHF with right-sided pleural effusion.  She is treated with Lasix.  ED physician request admission. PAST MEDICAL HISTORY:   Past Medical History:  Diagnosis Date  . Asthma   . Atrial fibrillation ([place])   . Breast cancer ([place]) 2011  . Bronchitis 04/2015  . Cancer [facility]) 2011   breast  . COPD (chronic obstructive pulmonary disease) ([place])   . Hypertension   . Shingles    10/2015    PAST SURGICAL HISTORY:   Past Surgical History:  Procedure Laterality Date  . BREAST EXCISIONAL BIOPSY Right 11/29/13   two areas FAT NECROSIS  . BREAST EXCISIONAL BIOPSY Right 10/30/2009   lumpectomy - radiation  . COLONOSCOPY WITH PROPOFOL N/A 06/10/2018   Procedure: COLONOSCOPY WITH PROPOFOL;  Surgeon: Manya Silvas, MD;  Location: [facility] ENDOSCOPY;  Service: Endoscopy;  Laterality: N/A;  . COLONOSCOPY WITH PROPOFOL N/A 02/20/2019   Procedure: COLONOSCOPY WITH PROPOFOL;  Surgeon: Lucilla Lame, MD;  Location: [facility] ENDOSCOPY;  Service: Endoscopy;  Laterality: N/A;  .  ESOPHAGOGASTRODUODENOSCOPY (EGD) WITH PROPOFOL N/A 06/10/2018   Procedure: ESOPHAGOGASTRODUODENOSCOPY (EGD) WITH PROPOFOL;  Surgeon: Manya Silvas, MD;  Location[facility] ENDOSCOPY;  Service: Endoscopy;  Laterality: N/A;  . ESOPHAGOGASTRODUODENOSCOPY (EGD) WITH PROPOFOL N/A 02/20/2019   Procedure: ESOPHAGOGASTRODUODENOSCOPY (EGD) WITH PROPOFOL;  Surgeon: Lucilla Lame, MD;  Location: [facility] ENDOSCOPY;  Service: Endoscopy;  Laterality: N/A;  . NASAL SINUS SURGERY      SOCIAL HISTORY:   Social History   Tobacco Use  . Smoking status: Never Smoker  . Smokeless tobacco: Never Used  Substance Use Topics  . Alcohol use: No    Alcohol/week: 0.0 standard drinks    FAMILY HISTORY:   Family History  Problem Relation Age of Onset  . Hypertension Mother   . Heart disease Mother   . Cancer Mother   . Stroke Father   . Hypertension Father   . Breast cancer Sister     DRUG ALLERGIES:   Allergies  Allergen Reactions  . Diphenhydramine Hcl Rash  . Penicillins Hives    .Has patient had a PCN reaction causing immediate rash, facial/tongue/throat swelling, SOB or lightheadedness with hypotension: Unknown Has patient had a PCN reaction causing severe rash involving mucus membranes or skin necrosis: Unknown Has patient had a PCN reaction that required hospitalization: Unknown Has patient had a PCN reaction occurring within the last 10 years: Unknown If all of the above answers are "NO", then may proceed with Cephalosporin use.   . Captopril Other (See Comments)  . Eliquis [Apixaban] Other (See Comments)  Stomach bleed  . Enalapril Maleate Other (See Comments)    Other reaction(s): Unknown  . Tape Itching  . Terfenadine Other (See Comments)  . Augmentin [Amoxicillin-Pot Clavulanate] Rash    Has patient had a PCN reaction causing immediate rash, facial/tongue/throat swelling, SOB or lightheadedness with hypotension: Unknown Has patient had a PCN reaction causing severe rash involving mucus  membranes or skin necrosis: Unknown Has patient had a PCN reaction that required hospitalization: Unknown Has patient had a PCN reaction occurring within the last 10 years: Unknown If all of the above answers are "NO", then may proceed with Cephalosporin use.  . Biaxin [Clarithromycin] Rash, Other (See Comments) and Hives    Other reaction(s): Unknown    REVIEW OF SYSTEMS:   Review of Systems  Constitutional: Negative for chills, fever and malaise/fatigue.  HENT: Negative for sore throat.   Eyes: Negative for blurred vision and double vision.  Respiratory: Positive for cough. Negative for hemoptysis, shortness of breath, wheezing and stridor.   Cardiovascular: Negative for chest pain, palpitations, orthopnea and leg swelling.  Gastrointestinal: Negative for abdominal pain, blood in stool, diarrhea, melena, nausea and vomiting.  Genitourinary: Negative for dysuria, flank pain and hematuria.  Musculoskeletal: Negative for back pain and joint pain.  Neurological: Negative for dizziness, sensory change, focal weakness, seizures, loss of consciousness, weakness and headaches.  Endo/Heme/Allergies: Negative for polydipsia.  Psychiatric/Behavioral: Negative for depression. The patient is not nervous/anxious.     MEDICATIONS AT HOME:   Prior to Admission medications   Medication Sig Start Date End Date Taking? Authorizing Provider  albuterol (ACCUNEB) 1.25 MG/3ML nebulizer solution Inhale 3 mLs into the lungs every 6 (six) hours as needed for wheezing.    Yes [provider]  albuterol (PROVENTIL HFA;VENTOLIN HFA) 108 (90 Base) MCG/ACT inhaler Inhale 2 puffs into the lungs every 6 (six) hours as needed for wheezing or shortness of breath.    Yes [provider]  ALPRAZolam Duanne Moron) 0.5 MG tablet Take 0.5 mg by mouth 2 (two) times a day.    Yes [provider]  alum & mag hydroxide-simeth (MAALOX/MYLANTA) 200-200-20 MG/5ML suspension Take 30 mLs by mouth every 4 (four)  hours as needed for indigestion or heartburn. 06/08/19  Yes Demetrios Loll, MD  atorvastatin (LIPITOR) 40 MG tablet Take 40 mg by mouth daily. 03/17/19  Yes [provider]  busPIRone (BUSPAR) 10 MG tablet Take 10 mg by mouth 2 (two) times daily.   Yes [provider]  cholecalciferol (VITAMIN D) 1000 units tablet Take 1,000 Units by mouth daily.   Yes [provider]  cloNIDine (CATAPRES) 0.1 MG tablet Take 0.1 mg by mouth 2 (two) times daily as needed. 02/13/19  Yes [provider]  diltiazem (CARDIZEM CD) 180 MG 24 hr capsule Take 1 capsule by mouth daily. 04/07/19  Yes [provider]  ferrous sulfate 325 (65 FE) MG tablet Take 325 mg by mouth daily with breakfast.   Yes [provider]  Fluticasone-Salmeterol (ADVAIR) 250-50 MCG/DOSE AEPB Inhale 1 puff into the lungs 2 (two) times daily.   Yes [provider]  hydrALAZINE (APRESOLINE) 10 MG tablet Take 5 tablets (50 mg total) by mouth 3 (three) times daily. 02/21/19  Yes Fritzi Mandes, MD  HYDROcodone-acetaminophen (NORCO/VICODIN) 5-325 MG tablet Take 1 tablet by mouth 2 (two) times daily.  04/26/19  Yes [provider]  levothyroxine (SYNTHROID, LEVOTHROID) 50 MCG tablet Take 50 mcg by mouth daily before breakfast.   Yes [provider]  loratadine (CLARITIN) 10 MG tablet Take 10 mg by mouth daily.   Yes [provider]  losartan (COZAAR) 50 MG tablet Take 2 tablets (100 mg total) by mouth at bedtime. 12/13/18  Yes Wieting, Richard, MD  magnesium oxide (MAG-OX) 400 MG tablet Take 400 mg by mouth 2 (two) times daily.   Yes [provider]  meclizine (ANTIVERT) 25 MG tablet Take 1 tablet (25 mg total) by mouth 2 (two) times daily as needed for dizziness. 09/16/18  Yes Merlyn Lot, MD  meloxicam (MOBIC) 15 MG tablet Take 15 mg by mouth daily. 02/07/19  Yes [provider]  mometasone-formoterol (DULERA) 100-5 MCG/ACT AERO Inhale 2 puffs into the lungs  2 (two) times daily.   Yes [provider]  ondansetron (ZOFRAN-ODT) 4 MG disintegrating tablet Take 1 tablet by mouth every 8 (eight) hours as needed.  05/05/19  Yes [provider]  polyethylene glycol (MIRALAX / GLYCOLAX) 17 g packet Take 17 g by mouth daily. 01/03/19  Yes Earleen Newport, MD  RABEprazole (ACIPHEX) 20 MG tablet Take 20 mg by mouth 2 (two) times daily.   Yes [provider]  venlafaxine (EFFEXOR) 37.5 MG tablet Take 37.5 mg by mouth daily.   Yes [provider]  venlafaxine XR (EFFEXOR-XR) 75 MG 24 hr capsule Take 75 mg by mouth daily.   Yes [provider]  furosemide (LASIX) 20 MG tablet Take 1 tablet (20 mg total) by mouth daily. 06/09/19 06/08/20  Duffy Bruce, MD      VITAL SIGNS:  Blood pressure (!) 141/87, pulse 74, temperature 98 F (36.7 C), temperature source Oral, resp. rate (!) 24, SpO2 96 %.  PHYSICAL EXAMINATION:  Physical Exam  GENERAL:  85 y.o.-year-old patient lying in the bed with no acute distress.  EYES: Pupils equal, round, reactive to light and accommodation. No scleral icterus. Extraocular muscles intact.  HEENT: Head atraumatic, normocephalic.  NECK:  Supple, no jugular venous distention. No thyroid enlargement, no tenderness.  LUNGS: Normal breath sounds bilaterally, no wheezing, rales,rhonchi or crepitation. No use of accessory muscles of respiration.  CARDIOVASCULAR: S1, S2 normal. No murmurs, rubs, or gallops.  ABDOMEN: Soft, nontender, nondistended. Bowel sounds present. No organomegaly or mass.  EXTREMITIES: No pedal edema, cyanosis, or clubbing.  NEUROLOGIC: Cranial nerves II through XII are intact. Muscle strength 5/5 in all extremities. Sensation intact. Gait not checked.  PSYCHIATRIC: The patient is alert and oriented x 3.  SKIN: No obvious rash, lesion, or ulcer.   LABORATORY PANEL:   CBC Recent Labs  Lab 06/09/19 1017  WBC 7.4  HGB 11.4*  HCT 34.0*  PLT 155    ------------------------------------------------------------------------------------------------------------------  Chemistries  Recent Labs  Lab 06/04/19 1223  06/09/19 1017  NA 120*   < > 133*  K 4.8   < > 3.9  CL 85*   < > 100  CO2 25   < > 24  GLUCOSE 86   < > 123*  BUN 19   < > 16  CREATININE 0.88   < > 0.76  CALCIUM 9.0   < > 8.9  MG  --   --  1.9  AST 26  --   --   ALT 17  --   --   ALKPHOS 54  --   --   BILITOT 0.9  --   --    < > = values in this interval not displayed.   ------------------------------------------------------------------------------------------------------------------  Cardiac Enzymes No results for input(s): TROPONINI in  the last 168 hours. ------------------------------------------------------------------------------------------------------------------  RADIOLOGY:  Dg Chest 2 View  Result Date: 06/09/2019 CLINICAL DATA:  Weakness and hypertension EXAM: CHEST - 2 VIEW COMPARISON:  05/31/2011 FINDINGS: Cardiac shadow is stable. Mild aortic calcifications are noted. Increased vascular congestion with interstitial edema is seen. Patchy atelectatic changes are noted in the bases. Small right pleural effusion is noted as well. IMPRESSION: Changes of mild CHF with right-sided pleural effusion. Electronically Signed   By: Inez Catalina M.D.   On: 06/09/2019 10:57      IMPRESSION AND PLAN:   Mild fluid overload, possible acute diastolic CHF. The patient will be admitted to tele. Continue Lasix 20 mg daily, fluid restriction.  Hyponatremia.  Potassium improved to 133, at baseline. A. fib.  Continue Cardizem. Hypertension.  Continue hypertension medication.  IV Lopressor as needed. COPD.  Stable.  Continue home nebulizer. Hyperlipidemia.  Continue Lipitor. Generalized weakness.  The patient is set up home health and PT. All the records are reviewed and case discussed with ED provider. Management plans discussed with the patient, family and they are  in agreement.  CODE STATUS: Full code.  TOTAL TIME TAKING CARE OF THIS PATIENT: 48 minutes.    Demetrios Loll M.D on 06/09/2019 at 1:38 PM  Between 7am to 6pm - Pager - [phone]  After 6pm go to [email or web address] - Proofreader  [facility]  Office  [phone]  CC: Primary care physician; [name], MD   Note: This dictation was prepared with Dragon dictation along with smaller phrase technology. Any transcriptional errors that result from this process are unin

## 2019-06-09 NOTE — ED Notes (Signed)
Patient transported to CT 

## 2019-06-09 NOTE — ED Notes (Signed)
Pt assisted to the bathroom. Pt has steady gait.

## 2019-06-09 NOTE — Progress Notes (Signed)
Pt arrived from the ED at 1815. Pt was A&Ox4. Pt arrived with upper and lower dentures, purse, clothes, shoes, and 2 rings. Pt oriented to room and bedside equipment. Pt asked not to get OOB without assistance and pt verbalized understanding. Fall risk and allergy bracelets applied. Care plan and education initiated. Pt had no c/o pain. Bed in lowest position, bed alarm is on, and the call bell is within reach.

## 2019-06-09 NOTE — ED Notes (Signed)
This RN attempted to call pt's husband but no answer.

## 2019-06-09 NOTE — ED Provider Notes (Signed)
[facility] Emergency Department Provider Note  ____________________________________________   First MD Initiated Contact with Patient 06/09/19 1009     (approximate)  I have reviewed the triage vital signs and the nursing notes.   HISTORY  Chief Complaint Hypertension    HPI Janice Perkins is a 85 y.o. female    with past medical history as below including recent admission for hyponatremia here with facial swelling, cough, and transient dizziness/blurred vision.  The patient states that she was just discharged yesterday following hospitalization for hyponatremia.  She was given a significant of IV fluid for this.  She states that she felt fine going home, and she felt generally weak at home, due to being in the hospital, but otherwise felt fine.  She awoke this morning, and noticed a mild headache as well as transient, doubt full, blurred vision.  She checked her blood pressure and it was in the 0000000 systolic.  She had similar symptoms when her blood pressure elevates in the past.  She took one of her p.o. clonidine, and now feels much better.  She does note that she has noticed facial swelling as well as a mild, occasionally productive cough with white, frothy sputum.  No known fevers.  No shortness of breath.  No chest pain.  No focal numbness or weakness.  No headache currently.  She does not recall changing any of her blood pressure medications during her hospitalization.       Past Medical History:  Diagnosis Date  . Asthma   . Atrial fibrillation ([place])   . Breast cancer ([place]) 2011  . Bronchitis 04/2015  . Cancer [facility]) 2011   breast  . COPD (chronic obstructive pulmonary disease) ([place])   . Hypertension   . Shingles    10/2015    Patient Active Problem List   Diagnosis Date Noted  . Fluid overload 06/09/2019  . Bronchitis 05/31/2019  . Hypothyroidism 05/31/2019  . Duodenum ulcer   . Melena   . GI bleed 02/17/2019  . Hypertensive encephalopathy  12/11/2018  . Hypoxia 05/30/2018  . Leukocytosis 04/05/2018  . Pneumonia 11/15/2017  . Pleural effusion 05/16/2017  . AF (paroxysmal atrial fibrillation) ([place]) 05/16/2017  . Breast cancer ([place]) 05/16/2017  . Dependence on nocturnal oxygen therapy 05/16/2017  . HTN (hypertension) 05/16/2017  . Generalized weakness 03/07/2017  . Hyponatremia 03/07/2017  . Dehydration 03/07/2017  . UTI (urinary tract infection) 03/07/2017  . HCAP (healthcare-associated pneumonia) 01/19/2017  . AKI (acute kidney injury) ([place]) 12/18/2016  . Primary cancer of upper outer quadrant of right female breast ([place]) 04/07/2016  . Lumbar radiculopathy 03/23/2016  . Spinal stenosis, lumbar region, with neurogenic claudication 03/23/2016  . DDD (degenerative disc disease), lumbar 01/14/2015  . Lumbosacral facet joint syndrome 01/14/2015  . Sacroiliac joint dysfunction 01/14/2015  . Greater trochanteric bursitis 01/14/2015  . Bilateral occipital neuralgia 01/14/2015    Past Surgical History:  Procedure Laterality Date  . BREAST EXCISIONAL BIOPSY Right 11/29/13   two areas FAT NECROSIS  . BREAST EXCISIONAL BIOPSY Right 10/30/2009   lumpectomy - radiation  . COLONOSCOPY WITH PROPOFOL N/A 06/10/2018   Procedure: COLONOSCOPY WITH PROPOFOL;  Surgeon: Manya Silvas, MD;  [facility] ENDOSCOPY;  Service: Endoscopy;  Laterality: N/A;  . COLONOSCOPY WITH PROPOFOL N/A 02/20/2019   Procedure: COLONOSCOPY WITH PROPOFOL;  Surgeon: Lucilla Lame, MD;  Location: [facility] ENDOSCOPY;  Service: Endoscopy;  Laterality: N/A;  . ESOPHAGOGASTRODUODENOSCOPY (EGD) WITH PROPOFOL N/A 06/10/2018   Procedure: ESOPHAGOGASTRODUODENOSCOPY (EGD) WITH PROPOFOL;  Surgeon: Manya Silvas, MD;  Location: [facility] ENDOSCOPY;  Service: Endoscopy;  Laterality: N/A;  . ESOPHAGOGASTRODUODENOSCOPY (EGD) WITH PROPOFOL N/A 02/20/2019   Procedure: ESOPHAGOGASTRODUODENOSCOPY (EGD) WITH PROPOFOL;  Surgeon: Lucilla Lame, MD;  Location: [facility] ENDOSCOPY;  Service:  Endoscopy;  Laterality: N/A;  . NASAL SINUS SURGERY      Prior to Admission medications   Medication Sig Start Date End Date Taking? Authorizing Provider  albuterol (ACCUNEB) 1.25 MG/3ML nebulizer solution Inhale 3 mLs into the lungs every 6 (six) hours as needed for wheezing.    Yes [provider]  albuterol (PROVENTIL HFA;VENTOLIN HFA) 108 (90 Base) MCG/ACT inhaler Inhale 2 puffs into the lungs every 6 (six) hours as needed for wheezing or shortness of breath.    Yes [provider]  ALPRAZolam Duanne Moron) 0.5 MG tablet Take 0.5 mg by mouth 2 (two) times a day.    Yes [provider]  alum & mag hydroxide-simeth (MAALOX/MYLANTA) 200-200-20 MG/5ML suspension Take 30 mLs by mouth every 4 (four) hours as needed for indigestion or heartburn. 06/08/19  Yes Demetrios Loll, MD  atorvastatin (LIPITOR) 40 MG tablet Take 40 mg by mouth daily. 03/17/19  Yes [provider]  busPIRone (BUSPAR) 10 MG tablet Take 10 mg by mouth 2 (two) times daily.   Yes [provider]  cholecalciferol (VITAMIN D) 1000 units tablet Take 1,000 Units by mouth daily.   Yes [provider]  cloNIDine (CATAPRES) 0.1 MG tablet Take 0.1 mg by mouth 2 (two) times daily as needed. 02/13/19  Yes [provider]  diltiazem (CARDIZEM CD) 180 MG 24 hr capsule Take 1 capsule by mouth daily. 04/07/19  Yes [provider]  ferrous sulfate 325 (65 FE) MG tablet Take 325 mg by mouth daily with breakfast.   Yes [provider]  Fluticasone-Salmeterol (ADVAIR) 250-50 MCG/DOSE AEPB Inhale 1 puff into the lungs 2 (two) times daily.   Yes [provider]  hydrALAZINE (APRESOLINE) 10 MG tablet Take 5 tablets (50 mg total) by mouth 3 (three) times daily. 02/21/19  Yes Fritzi Mandes, MD  HYDROcodone-acetaminophen (NORCO/VICODIN) 5-325 MG tablet Take 1 tablet by mouth 2 (two) times daily.  04/26/19  Yes [provider]  levothyroxine (SYNTHROID, LEVOTHROID) 50 MCG  tablet Take 50 mcg by mouth daily before breakfast.   Yes [provider]  loratadine (CLARITIN) 10 MG tablet Take 10 mg by mouth daily.   Yes [provider]  losartan (COZAAR) 50 MG tablet Take 2 tablets (100 mg total) by mouth at bedtime. 12/13/18  Yes Wieting, Richard, MD  magnesium oxide (MAG-OX) 400 MG tablet Take 400 mg by mouth 2 (two) times daily.   Yes [provider]  meclizine (ANTIVERT) 25 MG tablet Take 1 tablet (25 mg total) by mouth 2 (two) times daily as needed for dizziness. 09/16/18  Yes Merlyn Lot, MD  meloxicam (MOBIC) 15 MG tablet Take 15 mg by mouth daily. 02/07/19  Yes [provider]  mometasone-formoterol (DULERA) 100-5 MCG/ACT AERO Inhale 2 puffs into the lungs 2 (two) times daily.   Yes [provider]  ondansetron (ZOFRAN-ODT) 4 MG disintegrating tablet Take 1 tablet by mouth every 8 (eight) hours as needed.  05/05/19  Yes [provider]  polyethylene glycol (MIRALAX / GLYCOLAX) 17 g packet Take 17 g by mouth daily. 01/03/19  Yes Earleen Newport, MD  RABEprazole (ACIPHEX) 20 MG tablet Take 20 mg by mouth 2 (two) times daily.   Yes [provider]  venlafaxine (EFFEXOR) 37.5 MG tablet Take 37.5 mg by mouth daily.   Yes [provider]  venlafaxine XR (EFFEXOR-XR) 75 MG 24 hr capsule Take 75 mg by mouth daily.   Yes [provider]  furosemide (LASIX) 20 MG tablet Take 1 tablet (20 mg total) by mouth daily. 06/09/19 06/08/20  Duffy Bruce, MD    Allergies Diphenhydramine hcl, Penicillins, Captopril, Eliquis [apixaban], Enalapril maleate, Tape, Terfenadine, Augmentin [amoxicillin-pot clavulanate], and Biaxin [clarithromycin]  Family History  Problem Relation Age of Onset  . Hypertension Mother   . Heart disease Mother   . Cancer Mother   . Stroke Father   . Hypertension Father   . Breast cancer Sister     Social History Social History   Tobacco Use  . Smoking status:  Never Smoker  . Smokeless tobacco: Never Used  Substance Use Topics  . Alcohol use: No    Alcohol/week: 0.0 standard drinks  . Drug use: No    Review of Systems  Review of Systems  Constitutional: Positive for fatigue. Negative for fever.  HENT: Negative for congestion and sore throat.   Eyes: Positive for visual disturbance.  Respiratory: Negative for cough and shortness of breath.   Cardiovascular: Negative for chest pain.  Gastrointestinal: Negative for abdominal pain, diarrhea, nausea and vomiting.  Genitourinary: Negative for flank pain.  Musculoskeletal: Negative for back pain and neck pain.  Skin: Negative for rash and wound.  Neurological: Negative for weakness.  All other systems reviewed and are negative.    ____________________________________________  PHYSICAL EXAM:      VITAL SIGNS: ED Triage Vitals  Enc Vitals Group     BP      Pulse      Resp      Temp      Temp src      SpO2      Weight      Height      Head Circumference      Peak Flow      Pain Score      Pain Loc      Pain Edu?      Excl. in [place]?      Physical Exam Vitals signs and nursing note reviewed.  Constitutional:      General: She is not in acute distress.    Appearance: She is well-developed.  HENT:     Head: Normocephalic and atraumatic.     Comments: Mild periorbital edema noted Eyes:     Conjunctiva/sclera: Conjunctivae normal.  Neck:     Musculoskeletal: Neck supple.  Cardiovascular:     Rate and Rhythm: Normal rate and regular rhythm.     Heart sounds: Normal heart sounds. No murmur. No friction rub.  Pulmonary:     Effort: Pulmonary effort is normal. No respiratory distress.     Breath sounds: Rales (Mild, bibasilar) present. No wheezing.  Abdominal:     General: There is no distension.     Palpations: Abdomen is soft.     Tenderness: There is no abdominal tenderness.  Musculoskeletal:     Right lower leg: Edema present.     Left lower leg: Edema present.  Skin:     General: Skin is warm.     Capillary Refill: Capillary refill takes less than 2 seconds.  Neurological:     Mental Status: She is alert and oriented to person, place, and time.     Motor: No abnormal muscle tone.     Comments: Neurological  Exam:  Mental Status: Alert and oriented to person, place, and time. Attention and concentration normal. Speech clear. Recent memory is intact. Cranial Nerves: Visual fields grossly intact. EOMI and PERRLA. No nystagmus noted. Facial sensation intact at forehead, maxillary cheek, and chin/mandible bilaterally. No facial asymmetry or weakness. Hearing grossly normal. Uvula is midline, and palate elevates symmetrically. Normal SCM and trapezius strength. Tongue midline without fasciculations. Motor: Muscle strength 5/5 in proximal and distal UE and LE bilaterally. No pronator drift. Muscle tone normal. Reflexes: 2+ and symmetrical in all four extremities.  Sensation: Intact to light touch in upper and lower extremities distally bilaterally.  Gait: Normal without ataxia. Coordination: Normal FTN bilaterally.          ____________________________________________   LABS (all labs ordered are listed, but only abnormal results are displayed)  Labs Reviewed  CBC WITH DIFFERENTIAL/PLATELET - Abnormal; Notable for the following components:      Result Value   Hemoglobin 11.4 (*)    HCT 34.0 (*)    Lymphs Abs 0.6 (*)    Eosinophils Absolute 0.8 (*)    All other components within normal limits  BASIC METABOLIC PANEL - Abnormal; Notable for the following components:   Sodium 133 (*)    Glucose, Bld 123 (*)    All other components within normal limits  BRAIN NATRIURETIC PEPTIDE - Abnormal; Notable for the following components:   B Natriuretic Peptide 610.0 (*)    All other components within normal limits  TROPONIN I (HIGH SENSITIVITY) - Abnormal; Notable for the following components:   Troponin I (High Sensitivity) 39 (*)    All other components  within normal limits  SARS CORONAVIRUS 2 (TAT 6-24 HRS)  MAGNESIUM  TROPONIN I (HIGH SENSITIVITY)    ____________________________________________  EKG: Atrial fibrillation, ventricular rate 77.  QRS 87, QTc 515.  No significant change from prior.  Nonspecific T wave changes diffusely, ________________________________________  RADIOLOGY All imaging, including plain films, CT scans, and ultrasounds, independently reviewed by me, and interpretations confirmed via formal radiology reads.  ED MD interpretation:   CXR: Pulmonary edema, CHF  Official radiology report(s): Dg Chest 2 View  Result Date: 06/09/2019 CLINICAL DATA:  Weakness and hypertension EXAM: CHEST - 2 VIEW COMPARISON:  05/31/2011 FINDINGS: Cardiac shadow is stable. Mild aortic calcifications are noted. Increased vascular congestion with interstitial edema is seen. Patchy atelectatic changes are noted in the bases. Small right pleural effusion is noted as well. IMPRESSION: Changes of mild CHF with right-sided pleural effusion. Electronically Signed   By: Inez Catalina M.D.   On: 06/09/2019 10:57    ____________________________________________  PROCEDURES   Procedure(s) performed (including Critical Care):  Procedures  ____________________________________________  INITIAL IMPRESSION / MDM / [place] / ED COURSE  As part of my medical decision making, I reviewed the following data within the [place] was evaluated in Emergency Department on 06/09/2019 for the symptoms described in the history of present illness. She was evaluated in the context of the global COVID-19 pandemic, which necessitated consideration that the patient might be at risk for infection with the SARS-CoV-2 virus that causes COVID-19. Institutional protocols and algorithms that pertain to the evaluation of patients at risk for COVID-19 are in a state of rapid change based on information released by  regulatory bodies including the CDC and federal and state organizations. These policies and algorithms were followed during the patient's care in the ED.  Some ED  evaluations and interventions may be delayed as a result of limited staffing during the pandemic.*      Medical Decision Making:  85 yo F here with SOB, cough, facial swelling, and transient HTN. EKG shows new TWI diffusely concerning for ischemia. CXR shows pulmonary edema. Pt was just admitted for hyponatremia and I suspect has acute CHF 2/2 fluids during this stay. Will need diuresis. D/w Nephrology who recommends lasix. Given new TWI, SOB, and elevated trop, will obs for diuresis and trending. No CP, doubt ACS.   ____________________________________________  FINAL CLINICAL IMPRESSION(S) / ED DIAGNOSES  Final diagnoses:  Acute pulmonary edema (HCC)  Essential hypertension  Acute on chronic diastolic congestive heart failure (HCC)     MEDICATIONS GIVEN DURING THIS VISIT:  Medications  furosemide (LASIX) tablet 20 mg (20 mg Oral Given 06/09/19 1135)  furosemide (LASIX) injection 20 mg (20 mg Intravenous Given 06/09/19 1244)     ED Discharge Orders         Ordered    furosemide (LASIX) 20 MG tablet  Daily     06/09/19 1132           Note:  This document was prepared using Dragon voice recognition software and may include unintentional dictation errors.   Duffy Bruce, MD 06/09/19 1414

## 2019-06-09 NOTE — ED Notes (Signed)
Pt resting in bed with eyes closed.

## 2019-06-10 DIAGNOSIS — I110 Hypertensive heart disease with heart failure: Secondary | ICD-10-CM | POA: Diagnosis not present

## 2019-06-10 DIAGNOSIS — R519 Headache, unspecified: Secondary | ICD-10-CM | POA: Diagnosis not present

## 2019-06-10 LAB — CBC
HCT: 33.7 % — ABNORMAL LOW (ref 36.0–46.0)
Hemoglobin: 11.4 g/dL — ABNORMAL LOW (ref 12.0–15.0)
MCH: 29.1 pg (ref 26.0–34.0)
MCHC: 33.8 g/dL (ref 30.0–36.0)
MCV: 86 fL (ref 80.0–100.0)
Platelets: 172 10*3/uL (ref 150–400)
RBC: 3.92 MIL/uL (ref 3.87–5.11)
RDW: 15.1 % (ref 11.5–15.5)
WBC: 7 10*3/uL (ref 4.0–10.5)
nRBC: 0 % (ref 0.0–0.2)

## 2019-06-10 LAB — BASIC METABOLIC PANEL
Anion gap: 10 (ref 5–15)
BUN: 19 mg/dL (ref 8–23)
CO2: 28 mmol/L (ref 22–32)
Calcium: 8.4 mg/dL — ABNORMAL LOW (ref 8.9–10.3)
Chloride: 94 mmol/L — ABNORMAL LOW (ref 98–111)
Creatinine, Ser: 0.91 mg/dL (ref 0.44–1.00)
GFR calc Af Amer: >60 mL/min (ref >60)
GFR calc non Af Amer: 58 mL/min — ABNORMAL LOW (ref >60)
Glucose, Bld: 109 mg/dL — ABNORMAL HIGH (ref 70–99)
Potassium: 3.9 mmol/L (ref 3.5–5.1)
Sodium: 132 mmol/L — ABNORMAL LOW (ref 135–145)

## 2019-06-10 LAB — SARS CORONAVIRUS 2 (TAT 6-24 HRS): SARS Coronavirus 2: NEGATIVE

## 2019-06-10 MED ORDER — FUROSEMIDE 10 MG/ML IJ SOLN
40.0000 mg | Freq: Every day | INTRAMUSCULAR | Status: DC
  Administered 2019-06-10: 40 mg via INTRAVENOUS
  Filled 2019-06-10: qty 4

## 2019-06-10 MED ORDER — METOPROLOL SUCCINATE ER 25 MG PO TB24: 25.0000 mg | ORAL_TABLET | Freq: Every day | ORAL | 0 refills | Status: DC

## 2019-06-10 MED ORDER — FUROSEMIDE 20 MG PO TABS: 20.0000 mg | ORAL_TABLET | Freq: Every day | ORAL | 0 refills | Status: DC

## 2019-06-10 MED ORDER — METOPROLOL SUCCINATE ER 25 MG PO TB24
25.0000 mg | ORAL_TABLET | Freq: Every day | ORAL | Status: DC
  Administered 2019-06-10: 25 mg via ORAL
  Filled 2019-06-10: qty 1

## 2019-06-10 NOTE — TOC Transition Note (Signed)
Transition of Care [facility]) - CM/SW Discharge Note   Patient Details  Name: Janice Perkins MRN: [id number] Date of Birth: 01-12-1933  Transition of Care [facility]) CM/SW Contact:  Latanya Maudlin, RN Phone Number: 06/10/2019, 11:51 AM   Clinical Narrative:  Patient to be discharged per MD order. Orders in place for home health services. Patient will [facility] care with [facility]. Notified Valerie of discharge.      Final next level of care: Home w Home Health Services Barriers to Discharge: No Barriers Identified   Patient Goals and CMS Choice     Choice offered to / list presented to : Patient  Discharge Placement                       Discharge Plan and Services   Discharge Planning Services: CM Consult Post Acute Care Choice: Home Health                    HH Arranged: RN, PT, Nurse's Aide [facility] Agency: [facility] ([place]) Date HH Agency Contacted: 06/10/19 Time [place]: [place] Representative spoke with at [place]: [place] ([place]) Interventions     Readmission Risk Interventions Readmission Risk Prevention Plan 06/10/2019 06/05/2019 06/01/2019  Transportation Screening Complete Complete Complete  Medication Review Press photographer) Complete Referral to Pharmacy Complete  PCP or Specialist appointment within 3-5 days of discharge - (No Data) Complete  PCP/Specialist Appt Not Complete comments - - -  HRI or Home Care Consult Complete Complete -  SW Recovery Care/Counseling Consult Patient refused Not Complete -  SW Consult Not Complete Comments - NA -  Palliative Care Screening Not Applicable Not Applicable Not [place] Not Applicable Not Applicable Not Applicable  Some recent data might be hidden

## 2019-06-10 NOTE — TOC Initial Note (Signed)
Transition of Care [facility]) - Initial/Assessment Note    Patient Details  Name: Janice Perkins MRN: [id number] Date of Birth: [birth year]  Transition of Care [facility]) CM/SW Contact:    Latanya Maudlin, RN Phone Number: 06/10/2019, 8:40 AM  Clinical Narrative:  TOC consulted to complete high risk readmission assessment. Patient was recently discharged after being treated for hyponatremia. Patients blood pressure was significantly elevated at home and was readmitted. Patient lives at home with spouse. Has a rollator, bedside commode, rolling walker and nocturnal oxygen through [employer]. [facility] was [facility] with [facility] and will need resumption of care orders prior to discharge.                Expected Discharge Plan: [place] Barriers to Discharge: Continued Medical Work up   Patient Goals and CMS Choice     Choice offered to / list presented to : Patient  Expected Discharge Plan and Services Expected Discharge Plan: [place]   Discharge Planning Services: CM Consult Post Acute Care Choice: [place] arrangements for the past 2 months: [place] Agency: [place] ([place]) Date HH Agency Contacted: 06/10/19 Time HH Agency Contacted: 0840 Representative spoke with at [place]: [place]  Prior Living Arrangements/Services Living arrangements for the past 2 months: [place] with:: Spouse              Current home services: DME    Activities of Daily Living Home Assistive Devices/Equipment: Dentures (specify type) ADL Screening (condition at time of admission) Patient's cognitive ability adequate to safely complete daily activities?: Yes Is the patient deaf or have difficulty hearing?: No Does the patient have difficulty seeing, even when wearing glasses/contacts?: No Does the patient have difficulty concentrating, remembering, or making decisions?:  No Patient able to express need for assistance with ADLs?: Yes Does the patient have difficulty dressing or bathing?: No Independently performs ADLs?: Yes (appropriate for developmental age) Does the patient have difficulty walking or climbing stairs?: No Weakness of Legs: Both Weakness of Arms/Hands: None  Permission Sought/Granted                  Emotional Assessment              Admission diagnosis:  Acute pulmonary edema ([place]) [J81.0] Acute on chronic diastolic congestive heart failure ([place]) [I50.33] Essential hypertension [I10] Fluid overload [E87.70] Patient Active Problem List   Diagnosis Date Noted  . Fluid overload 06/09/2019  . Bronchitis 05/31/2019  . Hypothyroidism 05/31/2019  . Duodenum ulcer   . Melena   . GI bleed 02/17/2019  . Hypertensive encephalopathy 12/11/2018  . Hypoxia 05/30/2018  . Leukocytosis 04/05/2018  . Pneumonia 11/15/2017  . Pleural effusion 05/16/2017  . AF (paroxysmal atrial fibrillation) ([place]) 05/16/2017  . Breast cancer ([place]) 05/16/2017  . Dependence on nocturnal oxygen therapy 05/16/2017  . HTN (hypertension) 05/16/2017  . Generalized weakness 03/07/2017  . Hyponatremia 03/07/2017  . Dehydration 03/07/2017  . UTI (urinary tract infection) 03/07/2017  . HCAP (healthcare-associated pneumonia) 01/19/2017  . AKI (acute kidney injury) ([place]) 12/18/2016  . Primary cancer of upper outer quadrant of right female breast ([place]) 04/07/2016  . Lumbar radiculopathy 03/23/2016  . Spinal stenosis, lumbar region, with neurogenic claudication 03/23/2016  . DDD (degenerative disc  disease), lumbar 01/14/2015  . Lumbosacral facet joint syndrome 01/14/2015  . Sacroiliac joint dysfunction 01/14/2015  . Greater trochanteric bursitis 01/14/2015  . Bilateral occipital neuralgia 01/14/2015   PCP:  [name], MD Pharmacy:   [facility], [place] [address] Phone: [phone] Fax:  [phone]     Social Determinants of Health (SDOH) Interventions    Readmission Risk Interventions Readmission Risk Prevention Plan 06/10/2019 06/05/2019 06/01/2019  Transportation Screening Complete Complete Complete  Medication Review (RN Care Manager) Complete Referral to Pharmacy Complete  PCP or Specialist appointment within 3-5 days of discharge - (No Data) Complete  PCP/Specialist Appt Not Complete comments - - -  HRI or Home Care Consult Complete Complete -  SW Recovery Care/Counseling Consult Patient refused Not Complete -  SW Consult Not Complete Comments - NA -  Palliative Care Screening Not Applicable Not Applicable Not Applicable  Skilled Nursing Facility Not Applicable Not Applicable Not Applicable  Some recent data might be hidden

## 2019-06-10 NOTE — Plan of Care (Signed)

## 2019-06-10 NOTE — Discharge Summary (Signed)
[facility] NAME: Janice Perkins    MR#:  [id number]  DATE OF BIRTH:  1933-02-20  DATE OF ADMISSION:  06/09/2019 ADMITTING PHYSICIAN: Demetrios Loll, MD  DATE OF DISCHARGE: 06/10/2019  2:36 PM  PRIMARY CARE PHYSICIAN: [name], MD    ADMISSION DIAGNOSIS:  Acute pulmonary edema ([place]) [J81.0] Acute on chronic diastolic congestive heart failure (HCC) [I50.33] Essential hypertension [I10] Fluid overload [E87.70]  DISCHARGE DIAGNOSIS:  Active Problems:   Fluid overload   SECONDARY DIAGNOSIS:   Past Medical History:  Diagnosis Date  . Asthma   . Atrial fibrillation ([place])   . Breast cancer ([place]) 2011  . Bronchitis 04/2015  . Cancer [facility]) 2011   breast  . COPD (chronic obstructive pulmonary disease) ([place])   . Hypertension   . Shingles    10/2015    HOSPITAL COURSE:   1.  Fluid overload from last hospitalization and IV fluids.  Acute diastolic congestive heart failure secondary to too much fluids on last hospitalization.  Patient was diuresed with IV Lasix.  We will continue Lasix 20 mg orally on a daily basis upon going home.  Added low-dose Toprol-XL. 2.  Chronic atrial fibrillation on Cardizem.  Added low-dose Toprol because when she walked around heart rate went up close to 130.  Not on any anticoagulation secondary to bleeding episodes in the past. 3.  Hyponatremia.  Sodium better than last hospitalization at 132. 4.  Weakness home health PT restarted 5.  COPD continue home medications 6.  Hyperlipidemia on Lipitor 7.  Hypertension.  Continue usual medication  DISCHARGE CONDITIONS:   Satisfactory  CONSULTS OBTAINED:  None  DRUG ALLERGIES:   Allergies  Allergen Reactions  . Diphenhydramine Hcl Rash  . Penicillins Hives    .Has patient had a PCN reaction causing immediate rash, facial/tongue/throat swelling, SOB or lightheadedness with hypotension: Unknown Has patient had a PCN reaction causing severe rash  involving mucus membranes or skin necrosis: Unknown Has patient had a PCN reaction that required hospitalization: Unknown Has patient had a PCN reaction occurring within the last 10 years: Unknown If all of the above answers are "NO", then may proceed with Cephalosporin use.   . Captopril Other (See Comments)  . Eliquis [Apixaban] Other (See Comments)    Stomach bleed  . Enalapril Maleate Other (See Comments)    Other reaction(s): Unknown  . Tape Itching  . Terfenadine Other (See Comments)  . Augmentin [Amoxicillin-Pot Clavulanate] Rash    Has patient had a PCN reaction causing immediate rash, facial/tongue/throat swelling, SOB or lightheadedness with hypotension: Unknown Has patient had a PCN reaction causing severe rash involving mucus membranes or skin necrosis: Unknown Has patient had a PCN reaction that required hospitalization: Unknown Has patient had a PCN reaction occurring within the last 10 years: Unknown If all of the above answers are "NO", then may proceed with Cephalosporin use.  . Biaxin [Clarithromycin] Rash, Other (See Comments) and Hives    Other reaction(s): Unknown    DISCHARGE MEDICATIONS:   Allergies as of 06/10/2019      Reactions   Diphenhydramine Hcl Rash   Penicillins Hives   .Has patient had a PCN reaction causing immediate rash, facial/tongue/throat swelling, SOB or lightheadedness with hypotension: Unknown Has patient had a PCN reaction causing severe rash involving mucus membranes or skin necrosis: Unknown Has patient had a PCN reaction that required hospitalization: Unknown Has patient had a PCN reaction occurring within the last 10 years: Unknown  If all of the above answers are "NO", then may proceed with Cephalosporin use.   Captopril Other (See Comments)   Eliquis [apixaban] Other (See Comments)   Stomach bleed   Enalapril Maleate Other (See Comments)   Other reaction(s): Unknown   Tape Itching   Terfenadine Other (See Comments)   Augmentin  [amoxicillin-pot Clavulanate] Rash   Has patient had a PCN reaction causing immediate rash, facial/tongue/throat swelling, SOB or lightheadedness with hypotension: Unknown Has patient had a PCN reaction causing severe rash involving mucus membranes or skin necrosis: Unknown Has patient had a PCN reaction that required hospitalization: Unknown Has patient had a PCN reaction occurring within the last 10 years: Unknown If all of the above answers are "NO", then may proceed with Cephalosporin use.   Biaxin [clarithromycin] Rash, Other (See Comments), Hives   Other reaction(s): Unknown      Medication List    TAKE these medications   albuterol 108 (90 Base) MCG/ACT inhaler Commonly known as: VENTOLIN HFA Inhale 2 puffs into the lungs every 6 (six) hours as needed for wheezing or shortness of breath.   albuterol 1.25 MG/3ML nebulizer solution Commonly known as: ACCUNEB Inhale 3 mLs into the lungs every 6 (six) hours as needed for wheezing.   ALPRAZolam 0.5 MG tablet Commonly known as: XANAX Take 0.5 mg by mouth 2 (two) times a day.   alum & mag hydroxide-simeth 200-200-20 MG/5ML suspension Commonly known as: MAALOX/MYLANTA Take 30 mLs by mouth every 4 (four) hours as needed for indigestion or heartburn.   atorvastatin 40 MG tablet Commonly known as: LIPITOR Take 40 mg by mouth daily.   busPIRone 10 MG tablet Commonly known as: BUSPAR Take 10 mg by mouth 2 (two) times daily.   cholecalciferol 1000 units tablet Commonly known as: VITAMIN D Take 1,000 Units by mouth daily.   cloNIDine 0.1 MG tablet Commonly known as: CATAPRES Take 0.1 mg by mouth 2 (two) times daily as needed.   diltiazem 180 MG 24 hr capsule Commonly known as: CARDIZEM CD Take 1 capsule by mouth daily.   ferrous sulfate 325 (65 FE) MG tablet Take 325 mg by mouth daily with breakfast.   Fluticasone-Salmeterol 250-50 MCG/DOSE Aepb Commonly known as: ADVAIR Inhale 1 puff into the lungs 2 (two) times  daily.   furosemide 20 MG tablet Commonly known as: Lasix Take 1 tablet (20 mg total) by mouth daily.   hydrALAZINE 10 MG tablet Commonly known as: APRESOLINE Take 5 tablets (50 mg total) by mouth 3 (three) times daily.   HYDROcodone-acetaminophen 5-325 MG tablet Commonly known as: NORCO/VICODIN Take 1 tablet by mouth 2 (two) times daily.   levothyroxine 50 MCG tablet Commonly known as: SYNTHROID Take 50 mcg by mouth daily before breakfast.   loratadine 10 MG tablet Commonly known as: CLARITIN Take 10 mg by mouth daily.   losartan 50 MG tablet Commonly known as: COZAAR Take 2 tablets (100 mg total) by mouth at bedtime.   magnesium oxide 400 MG tablet Commonly known as: MAG-OX Take 400 mg by mouth 2 (two) times daily.   meclizine 25 MG tablet Commonly known as: ANTIVERT Take 1 tablet (25 mg total) by mouth 2 (two) times daily as needed for dizziness.   meloxicam 15 MG tablet Commonly known as: MOBIC Take 15 mg by mouth daily.   metoprolol succinate 25 MG 24 hr tablet Commonly known as: TOPROL-XL Take 1 tablet (25 mg total) by mouth daily.   mometasone-formoterol 100-5 MCG/ACT Aero Commonly known as:  DULERA Inhale 2 puffs into the lungs 2 (two) times daily.   ondansetron 4 MG disintegrating tablet Commonly known as: ZOFRAN-ODT Take 1 tablet by mouth every 8 (eight) hours as needed.   polyethylene glycol 17 g packet Commonly known as: MIRALAX / GLYCOLAX Take 17 g by mouth daily.   RABEprazole 20 MG tablet Commonly known as: ACIPHEX Take 20 mg by mouth 2 (two) times daily.   venlafaxine XR 75 MG 24 hr capsule Commonly known as: EFFEXOR-XR Take 75 mg by mouth daily.   venlafaxine 37.5 MG tablet Commonly known as: EFFEXOR Take 37.5 mg by mouth daily.        DISCHARGE INSTRUCTIONS:   Follow-up PMD 5 days  If you experience worsening of your admission symptoms, develop shortness of breath, life threatening emergency, suicidal or homicidal thoughts you  must seek medical attention immediately by calling 911 or calling your MD immediately  if symptoms less severe.  You Must read complete instructions/literature along with all the possible adverse reactions/side effects for all the Medicines you take and that have been prescribed to you. Take any new Medicines after you have completely understood and accept all the possible adverse reactions/side effects.   Please note  You were cared for by a hospitalist during your hospital stay. If you have any questions about your discharge medications or the care you received while you were in the hospital after you are discharged, you can call the unit and asked to speak with the hospitalist on call if the hospitalist that took care of you is not available. Once you are discharged, your primary care physician will handle any further medical issues. Please note that NO REFILLS for any discharge medications will be authorized once you are discharged, as it is imperative that you return to your primary care physician (or establish a relationship with a primary care physician if you do not have one) for your aftercare needs so that they can reassess your need for medications and monitor your lab values.    Today   CHIEF COMPLAINT:   Chief Complaint  Patient presents with  . Hypertension    HISTORY OF PRESENT ILLNESS:  Jeannean Porteous  is a 85 y.o. female presented with her face being swollen   VITAL SIGNS:  Blood pressure 125/71, pulse (!) 104, temperature 97.6 F (36.4 C), temperature source Oral, resp. rate 18, height 5' (1.524 m), weight 70.1 kg, SpO2 98 %.   PHYSICAL EXAMINATION:  GENERAL:  85 y.o.-year-old patient lying in the bed with no acute distress.  EYES: Pupils equal, round, reactive to light and accommodation. No scleral icterus. Extraocular muscles intact.  HEENT: Head atraumatic, normocephalic. Oropharynx and nasopharynx clear.  NECK:  Supple, no jugular venous distention. No thyroid  enlargement, no tenderness.  LUNGS: Normal breath sounds bilaterally, no wheezing, rales,rhonchi or crepitation. No use of accessory muscles of respiration.  CARDIOVASCULAR: S1, S2 irregularly irregular. No murmurs, rubs, or gallops.  ABDOMEN: Soft, non-tender, non-distended. Bowel sounds present. No organomegaly or mass.  EXTREMITIES: No pedal edema, cyanosis, or clubbing.  NEUROLOGIC: Cranial nerves II through XII are intact. Muscle strength 5/5 in all extremities. Sensation intact. Gait not checked.  PSYCHIATRIC: The patient is alert and oriented x 3.  SKIN: No obvious rash, lesion, or ulcer.   DATA REVIEW:   CBC Recent Labs  Lab 06/10/19 0430  WBC 7.0  HGB 11.4*  HCT 33.7*  PLT 172    Chemistries  Recent Labs  Lab 06/04/19 1223  06/09/19  1017 06/10/19 0430  NA 120*   < > 133* 132*  K 4.8   < > 3.9 3.9  CL 85*   < > 100 94*  CO2 25   < > 24 28  GLUCOSE 86   < > 123* 109*  BUN 19   < > 16 19  CREATININE 0.88   < > 0.76 0.91  CALCIUM 9.0   < > 8.9 8.4*  MG  --   --  1.9  --   AST 26  --   --   --   ALT 17  --   --   --   ALKPHOS 54  --   --   --   BILITOT 0.9  --   --   --    < > = values in this interval not displayed.     Microbiology Results  Results for orders placed or performed during the hospital encounter of 06/09/19  SARS CORONAVIRUS 2 (TAT 6-24 HRS) Nasopharyngeal Nasopharyngeal Swab     Status: None   Collection Time: 06/09/19 10:17 AM   Specimen: Nasopharyngeal Swab  Result Value Ref Range Status   SARS Coronavirus 2 NEGATIVE NEGATIVE Final    Comment: (NOTE) SARS-CoV-2 target nucleic acids are NOT DETECTED. The SARS-CoV-2 RNA is generally detectable in upper and lower respiratory specimens during the acute phase of infection. Negative results do not preclude SARS-CoV-2 infection, do not rule out co-infections with other pathogens, and should not be used as the sole basis for treatment or other patient management decisions. Negative results must  be combined with clinical observations, patient history, and epidemiological information. The expected result is Negative. Fact Sheet for Patients: SugarRoll.be Fact Sheet for Healthcare Providers: https://www.woods-mathews.com/ This test is not yet approved or cleared by the [place] FDA and  has been authorized for detection and/or diagnosis of SARS-CoV-2 by FDA under an Emergency Use Authorization (EUA). This EUA will remain  in effect (meaning this test can be used) for the duration of the COVID-19 declaration under Section 56 4(b)(1) of the Act, 21 U.S.C. section 360bbb-3(b)(1), unless the authorization is terminated or revoked sooner. Performed at [facility], [place] [address], [place], [place] [address]     RADIOLOGY:  Dg Chest 2 View  Result Date: 06/09/2019 CLINICAL DATA:  Weakness and hypertension EXAM: CHEST - 2 VIEW COMPARISON:  05/31/2011 FINDINGS: Cardiac shadow is stable. Mild aortic calcifications are noted. Increased vascular congestion with interstitial edema is seen. Patchy atelectatic changes are noted in the bases. Small right pleural effusion is noted as well. IMPRESSION: Changes of mild CHF with right-sided pleural effusion. Electronically Signed   By: Inez Catalina M.D.   On: 06/09/2019 10:57      Management plans discussed with the patient, family and they are in agreement.  CODE STATUS:     Code Status Orders  (From admission, onward)         Start     Ordered   06/09/19 1648  Full code  Continuous     06/09/19 1648        Code Status History    Date Active Date Inactive Code Status Order ID Comments User Context   06/04/2019 1550 06/08/2019 1442 Full Code [id number]  Saundra Shelling, MD Inpatient   05/31/2019 2317 06/01/2019 1756 Full Code [id number]  Lance Coon, MD Inpatient   04/08/2019 2134 04/09/2019 1549 Full Code [id number]  Sela Hua, MD Inpatient   02/17/2019 1440 02/21/2019 2037  Full  Code [id number]  Saundra Shelling, MD ED   12/11/2018 1041 12/13/2018 1437 Full Code [id number]  Loletha Grayer, MD ED   09/02/2018 1156 09/08/2018 1621 Full Code [id number]  Nicholes Mango, MD Inpatient   05/30/2018 1127 05/31/2018 1740 Full Code [id number]  Bettey Costa, MD Inpatient   11/15/2017 1829 11/21/2017 1821 Full Code [id number]  Vaughan Basta, MD Inpatient   05/16/2017 0041 05/17/2017 1824 Full Code [id number]  Quintella Baton, MD ED   03/07/2017 1446 03/09/2017 1509 Full Code [id number]  Idelle Crouch, MD Inpatient   01/19/2017 0837 01/21/2017 1415 Full Code [id number]  Harrie Foreman, MD Inpatient   12/18/2016 0718 12/22/2016 1947 Full Code [id number]  Harrie Foreman, MD Inpatient   Advance Care Planning Activity      TOTAL TIME TAKING CARE OF THIS PATIENT: 35 minutes.    Loletha Grayer M.D on 06/10/2019 at 3:06 PM  Between 7am to 6pm - Pager - [phone]  After 6pm go to [email or web address] - password [email or web address]  [facility] Office  [phone]  CC: Primary care physician; [name], MD

## 2019-06-10 NOTE — Progress Notes (Signed)
Janice Perkins to be D/C'd Home per MD order.  Discussed prescriptions and follow up appointments with the patient. Prescriptions were e-prescribed, medication list explained in detail. Pt and husband verbalized understanding.  Allergies as of 06/10/2019      Reactions   Diphenhydramine Hcl Rash   Penicillins Hives   .Has patient had a PCN reaction causing immediate rash, facial/tongue/throat swelling, SOB or lightheadedness with hypotension: Unknown Has patient had a PCN reaction causing severe rash involving mucus membranes or skin necrosis: Unknown Has patient had a PCN reaction that required hospitalization: Unknown Has patient had a PCN reaction occurring within the last 10 years: Unknown If all of the above answers are "NO", then may proceed with Cephalosporin use.   Captopril Other (See Comments)   Eliquis [apixaban] Other (See Comments)   Stomach bleed   Enalapril Maleate Other (See Comments)   Other reaction(s): Unknown   Tape Itching   Terfenadine Other (See Comments)   Augmentin [amoxicillin-pot Clavulanate] Rash   Has patient had a PCN reaction causing immediate rash, facial/tongue/throat swelling, SOB or lightheadedness with hypotension: Unknown Has patient had a PCN reaction causing severe rash involving mucus membranes or skin necrosis: Unknown Has patient had a PCN reaction that required hospitalization: Unknown Has patient had a PCN reaction occurring within the last 10 years: Unknown If all of the above answers are "NO", then may proceed with Cephalosporin use.   Biaxin [clarithromycin] Rash, Other (See Comments), Hives   Other reaction(s): Unknown      Medication List    TAKE these medications   albuterol 108 (90 Base) MCG/ACT inhaler Commonly known as: VENTOLIN HFA Inhale 2 puffs into the lungs every 6 (six) hours as needed for wheezing or shortness of breath.   albuterol 1.25 MG/3ML nebulizer solution Commonly known as: ACCUNEB Inhale 3 mLs into the lungs  every 6 (six) hours as needed for wheezing.   ALPRAZolam 0.5 MG tablet Commonly known as: XANAX Take 0.5 mg by mouth 2 (two) times a day.   alum & mag hydroxide-simeth 200-200-20 MG/5ML suspension Commonly known as: MAALOX/MYLANTA Take 30 mLs by mouth every 4 (four) hours as needed for indigestion or heartburn.   atorvastatin 40 MG tablet Commonly known as: LIPITOR Take 40 mg by mouth daily.   busPIRone 10 MG tablet Commonly known as: BUSPAR Take 10 mg by mouth 2 (two) times daily.   cholecalciferol 1000 units tablet Commonly known as: VITAMIN D Take 1,000 Units by mouth daily.   cloNIDine 0.1 MG tablet Commonly known as: CATAPRES Take 0.1 mg by mouth 2 (two) times daily as needed.   diltiazem 180 MG 24 hr capsule Commonly known as: CARDIZEM CD Take 1 capsule by mouth daily.   ferrous sulfate 325 (65 FE) MG tablet Take 325 mg by mouth daily with breakfast.   Fluticasone-Salmeterol 250-50 MCG/DOSE Aepb Commonly known as: ADVAIR Inhale 1 puff into the lungs 2 (two) times daily.   furosemide 20 MG tablet Commonly known as: Lasix Take 1 tablet (20 mg total) by mouth daily.   hydrALAZINE 10 MG tablet Commonly known as: APRESOLINE Take 5 tablets (50 mg total) by mouth 3 (three) times daily.   HYDROcodone-acetaminophen 5-325 MG tablet Commonly known as: NORCO/VICODIN Take 1 tablet by mouth 2 (two) times daily.   levothyroxine 50 MCG tablet Commonly known as: SYNTHROID Take 50 mcg by mouth daily before breakfast.   loratadine 10 MG tablet Commonly known as: CLARITIN Take 10 mg by mouth daily.   losartan  50 MG tablet Commonly known as: COZAAR Take 2 tablets (100 mg total) by mouth at bedtime.   magnesium oxide 400 MG tablet Commonly known as: MAG-OX Take 400 mg by mouth 2 (two) times daily.   meclizine 25 MG tablet Commonly known as: ANTIVERT Take 1 tablet (25 mg total) by mouth 2 (two) times daily as needed for dizziness.   meloxicam 15 MG tablet Commonly  known as: MOBIC Take 15 mg by mouth daily.   metoprolol succinate 25 MG 24 hr tablet Commonly known as: TOPROL-XL Take 1 tablet (25 mg total) by mouth daily.   mometasone-formoterol 100-5 MCG/ACT Aero Commonly known as: DULERA Inhale 2 puffs into the lungs 2 (two) times daily.   ondansetron 4 MG disintegrating tablet Commonly known as: ZOFRAN-ODT Take 1 tablet by mouth every 8 (eight) hours as needed.   polyethylene glycol 17 g packet Commonly known as: MIRALAX / GLYCOLAX Take 17 g by mouth daily.   RABEprazole 20 MG tablet Commonly known as: ACIPHEX Take 20 mg by mouth 2 (two) times daily.   venlafaxine XR 75 MG 24 hr capsule Commonly known as: EFFEXOR-XR Take 75 mg by mouth daily.   venlafaxine 37.5 MG tablet Commonly known as: EFFEXOR Take 37.5 mg by mouth daily.       Vitals:   06/10/19 0720 06/10/19 1313  BP: 123/81 125/71  Pulse: 66 (!) 104  Resp: 19 18  Temp: 97.6 F (36.4 C)   SpO2: 97% 98%    Tele box removed and returned.Skin clean, dry and intact without evidence of skin break down, no evidence of skin tears noted. IV catheter discontinued intact. Site without signs and symptoms of complications. Dressing and pressure applied. Pt denies pain at this time. No complaints noted.  An After Visit Summary was printed and given to the patient. Patient escorted via [place], and D/C home via private auto.  Rolley Sims

## 2019-06-10 NOTE — Plan of Care (Signed)
  Problem: Education: Goal: Knowledge of General Education information will improve Description: Including pain rating scale, medication(s)/side effects and non-pharmacologic comfort measures Outcome: Progressing   Problem: Pain Managment: Goal: General experience of comfort will improve Outcome: Progressing   Problem: Safety: Goal: Ability to remain free from injury will improve Outcome: Progressing   

## 2019-06-10 NOTE — Progress Notes (Signed)
Physical Therapy Evaluation Patient Details Name: Janice Perkins MRN: [id number] DOB: [birth year] Today's Date: 06/10/2019   History of Present Illness  85 yo female with onset of CHF and new use of O2 was admitted for SOB, note pleural effusion.  Pt is having PVC's with mobility and elevated HR.  PMHx:  asthma, bronchitis, a-fib, breast CA, COPD, HTN, shingles  Clinical Impression  Pt was seen for assessment of tolerance and independence with gait, with significant changes of heart rate and with PVC's occurring during the session.  HR was elevated from 100 at rest to 130, 12 PVC's with fluctuations afterward.  Have given pt instructions for home with safety, and nursing is aware of her tolerance for this movement.  Recommend with these findings that home therapy see pt to assess her tolerance and safety with gait given that despite walking 300' with no assistive device, she is not having stable heart rates and rhythm.  Follow acutely to monitor these events as well during gait.    Follow Up Recommendations Home health PT;Supervision for mobility/OOB    Equipment Recommendations  None recommended by PT    Recommendations for Other Services       Precautions / Restrictions Precautions Precautions: Fall Precaution Comments: monitor telemetry for arrhythmias Restrictions Weight Bearing Restrictions: No      Mobility  Bed Mobility Overal bed mobility: Modified Independent             General bed mobility comments: with HOB mildly elevated and bedrail  Transfers Overall transfer level: Needs assistance Equipment used: None Transfers: Sit to/from Stand Sit to Stand: Supervision            Ambulation/Gait Ambulation/Gait assistance: Min guard;Supervision Gait Distance (Feet): 300 Feet Assistive device: None Gait Pattern/deviations: Step-through pattern;Decreased stride length;Wide base of support Gait velocity: decreased Gait velocity interpretation: <1.31 ft/sec,  indicative of household ambulator General Gait Details: walking with occas touch of rail or counter  Stairs            Wheelchair Mobility    Modified Rankin (Stroke Patients Only)       Balance Overall balance assessment: Needs assistance Sitting-balance support: Feet supported Sitting balance-Leahy Scale: Good     Standing balance support: No upper extremity supported Standing balance-Leahy Scale: Fair                               Pertinent Vitals/Pain Pain Assessment: No/denies pain    Home Living Family/patient expects to be discharged to:: Private residence Living Arrangements: Spouse/significant other;Children Available Help at Discharge: Family Type of Home: House Home Access: Level entry     Home Layout: One level Home Equipment: Walker - standard;Toilet riser;Walker - 4 wheels Additional Comments: 1 small step inside the house, back side, daughter and son live with patient and her husband    Prior Function Level of Independence: Independent         Comments: pt has been able to do her housework and self care     Hand Dominance   Dominant Hand: Right    Extremity/Trunk Assessment   Upper Extremity Assessment Upper Extremity Assessment: Overall WFL for tasks assessed    Lower Extremity Assessment Lower Extremity Assessment: Overall WFL for tasks assessed(strength is 4+)    Cervical / Trunk Assessment Cervical / Trunk Assessment: Normal  Communication   Communication: HOH(helpful for pt to have close distance to speaker)  Cognition Arousal/Alertness: Awake/alert Behavior During  Therapy: WFL for tasks assessed/performed Overall Cognitive Status: Within Functional Limits for tasks assessed                                 General Comments: pt is able to give a reasonable history      General Comments General comments (skin integrity, edema, etc.): pt was able to walk well but PVC's at 12 upon stopping gait,  note pulse 130 with walking.      Exercises     Assessment/Plan    PT Assessment Patient needs continued PT services  PT Problem List Decreased strength;Decreased range of motion;Decreased activity tolerance;Decreased balance;Decreased mobility;Decreased coordination;Decreased knowledge of use of DME;Cardiopulmonary status limiting activity       PT Treatment Interventions DME instruction;Gait training;Functional mobility training;Therapeutic activities;Therapeutic exercise;Balance training;Neuromuscular re-education;Patient/family education    PT Goals (Current goals can be found in the Care Plan section)  Acute Rehab PT Goals Patient Stated Goal: to get home with husband PT Goal Formulation: With patient Time For Goal Achievement: 06/24/19 Potential to Achieve Goals: Good    Frequency Min 2X/week   Barriers to discharge (home with many family members)      Co-evaluation               AM-PAC PT "6 Clicks" Mobility  Outcome Measure Help needed turning from your back to your side while in a flat bed without using bedrails?: None Help needed moving from lying on your back to sitting on the side of a flat bed without using bedrails?: None Help needed moving to and from a bed to a chair (including a wheelchair)?: A Little Help needed standing up from a chair using your arms (e.g., wheelchair or bedside chair)?: A Little Help needed to walk in hospital room?: A Little Help needed climbing 3-5 steps with a railing? : A Lot 6 Click Score: 19    End of Session Equipment Utilized During Treatment: Gait belt Activity Tolerance: Other (comment)(HR and PVC's) Patient left: in bed;with call bell/phone within reach;with bed alarm set Nurse Communication: Mobility status PT Visit Diagnosis: Unsteadiness on feet (R26.81);Muscle weakness (generalized) (M62.81);Difficulty in walking, not elsewhere classified (R26.2)    Time: [id number] PT Time Calculation (min) (ACUTE ONLY): 24  min   Charges:   PT Evaluation $PT Eval Moderate Complexity: 1 Mod PT Treatments $Gait Training: 8-22 mins       Ramond Dial 06/10/2019, 2:19 PM   Mee Hives, PT MS Acute Rehab Dept. Number: [place]

## 2019-06-20 ENCOUNTER — Other Ambulatory Visit: Payer: Self-pay

## 2019-06-20 ENCOUNTER — Emergency Department
Admission: EM | Admit: 2019-06-20 | Discharge: 2019-06-20 | Disposition: A | Discharge status: Home / Self Care | Payer: Medicare Other | Attending: Emergency Medicine | Admitting: Emergency Medicine

## 2019-06-20 DIAGNOSIS — R531 Weakness: Secondary | ICD-10-CM | POA: Insufficient documentation

## 2019-06-20 DIAGNOSIS — E039 Hypothyroidism, unspecified: Secondary | ICD-10-CM | POA: Diagnosis not present

## 2019-06-20 DIAGNOSIS — Z79899 Other long term (current) drug therapy: Secondary | ICD-10-CM | POA: Diagnosis not present

## 2019-06-20 DIAGNOSIS — J449 Chronic obstructive pulmonary disease, unspecified: Secondary | ICD-10-CM | POA: Insufficient documentation

## 2019-06-20 DIAGNOSIS — R9431 Abnormal electrocardiogram [ECG] [EKG]: Secondary | ICD-10-CM | POA: Diagnosis not present

## 2019-06-20 DIAGNOSIS — Z853 Personal history of malignant neoplasm of breast: Secondary | ICD-10-CM | POA: Insufficient documentation

## 2019-06-20 DIAGNOSIS — I10 Essential (primary) hypertension: Secondary | ICD-10-CM | POA: Insufficient documentation

## 2019-06-20 LAB — BASIC METABOLIC PANEL
Anion gap: 13 (ref 5–15)
BUN: 16 mg/dL (ref 8–23)
CO2: 25 mmol/L (ref 22–32)
Calcium: 9.3 mg/dL (ref 8.9–10.3)
Chloride: 95 mmol/L — ABNORMAL LOW (ref 98–111)
Creatinine, Ser: 0.87 mg/dL (ref 0.44–1.00)
GFR calc Af Amer: >60 mL/min (ref >60)
GFR calc non Af Amer: >60 mL/min (ref >60)
Glucose, Bld: 123 mg/dL — ABNORMAL HIGH (ref 70–99)
Potassium: 4.1 mmol/L (ref 3.5–5.1)
Sodium: 133 mmol/L — ABNORMAL LOW (ref 135–145)

## 2019-06-20 LAB — CBC
HCT: 36.8 % (ref 36.0–46.0)
Hemoglobin: 12.2 g/dL (ref 12.0–15.0)
MCH: 29.3 pg (ref 26.0–34.0)
MCHC: 33.2 g/dL (ref 30.0–36.0)
MCV: 88.5 fL (ref 80.0–100.0)
Platelets: 276 10*3/uL (ref 150–400)
RBC: 4.16 MIL/uL (ref 3.87–5.11)
RDW: 15.7 % — ABNORMAL HIGH (ref 11.5–15.5)
WBC: 10.7 10*3/uL — ABNORMAL HIGH (ref 4.0–10.5)
nRBC: 0 % (ref 0.0–0.2)

## 2019-06-20 LAB — URINALYSIS, COMPLETE (UACMP) WITH MICROSCOPIC
Bacteria, UA: NONE SEEN
Bilirubin Urine: NEGATIVE
Glucose, UA: NEGATIVE mg/dL
Hgb urine dipstick: NEGATIVE
Ketones, ur: NEGATIVE mg/dL
Leukocytes,Ua: NEGATIVE
Nitrite: NEGATIVE
Protein, ur: NEGATIVE mg/dL
Specific Gravity, Urine: 1.008 (ref 1.005–1.030)
pH: 7 (ref 5.0–8.0)

## 2019-06-20 LAB — TROPONIN I (HIGH SENSITIVITY)
Troponin I (High Sensitivity): 21 ng/L — ABNORMAL HIGH (ref <18)
Troponin I (High Sensitivity): 21 ng/L — ABNORMAL HIGH (ref <18)

## 2019-06-20 MED ORDER — SODIUM CHLORIDE 0.9% FLUSH: 3.0000 mL | Freq: Once | INTRAVENOUS | Status: DC

## 2019-06-20 NOTE — Discharge Instructions (Addendum)
Please seek medical attention for any high fevers, chest pain, shortness of breath, change in behavior, persistent vomiting, bloody stool or any other new or concerning symptoms.  

## 2019-06-20 NOTE — ED Triage Notes (Signed)
Pt states she was discharged from the hospital last week for hyponatremia, states she has been having increased weakness and "shaky" feeling for the past 3 days, and states her b/p has been elevated. Pt is a/ox4 on arrival with a steady gate.

## 2019-06-20 NOTE — ED Provider Notes (Signed)
[facility] Emergency Department Provider Note   ____________________________________________   I have reviewed the triage vital signs and the nursing notes.   HISTORY  Chief Complaint Weakness   History limited by: Not Limited   HPI Janice Perkins is a 85 y.o. female who presents to the emergency department today because of concern for weakness.  Patient had a recent hospitalization for hyponatremia.  Since arriving home the patient states she has felt generalized weak.  She denies any associated pain.  She denies any fevers.  The patient states that she had been admitted for low blood sodium.  Since going home she has been trying to increase her sodium intake.  Additionally she mentions that she has been having black stools.  She states this is been going on for 3 to 4 weeks.   Records reviewed. Per medical record review patient has a history of atrial fibrillation. Recent hospitalization for hyponatremia.   Past Medical History:  Diagnosis Date  . Asthma   . Atrial fibrillation ([place])   . Breast cancer ([place]) 2011  . Bronchitis 04/2015  . Cancer [facility]) 2011   breast  . COPD (chronic obstructive pulmonary disease) ([place])   . Hypertension   . Shingles    10/2015    Patient Active Problem List   Diagnosis Date Noted  . Fluid overload 06/09/2019  . Bronchitis 05/31/2019  . Hypothyroidism 05/31/2019  . Duodenum ulcer   . Melena   . GI bleed 02/17/2019  . Hypertensive encephalopathy 12/11/2018  . Hypoxia 05/30/2018  . Leukocytosis 04/05/2018  . Pneumonia 11/15/2017  . Pleural effusion 05/16/2017  . AF (paroxysmal atrial fibrillation) ([place]) 05/16/2017  . Breast cancer ([place]) 05/16/2017  . Dependence on nocturnal oxygen therapy 05/16/2017  . HTN (hypertension) 05/16/2017  . Generalized weakness 03/07/2017  . Hyponatremia 03/07/2017  . Dehydration 03/07/2017  . UTI (urinary tract infection) 03/07/2017  . HCAP (healthcare-associated pneumonia)  01/19/2017  . AKI (acute kidney injury) ([place]) 12/18/2016  . Primary cancer of upper outer quadrant of right female breast ([place]) 04/07/2016  . Lumbar radiculopathy 03/23/2016  . Spinal stenosis, lumbar region, with neurogenic claudication 03/23/2016  . DDD (degenerative disc disease), lumbar 01/14/2015  . Lumbosacral facet joint syndrome 01/14/2015  . Sacroiliac joint dysfunction 01/14/2015  . Greater trochanteric bursitis 01/14/2015  . Bilateral occipital neuralgia 01/14/2015    Past Surgical History:  Procedure Laterality Date  . BREAST EXCISIONAL BIOPSY Right 11/29/13   two areas FAT NECROSIS  . BREAST EXCISIONAL BIOPSY Right 10/30/2009   lumpectomy - radiation  . COLONOSCOPY WITH PROPOFOL N/A 06/10/2018   Procedure: COLONOSCOPY WITH PROPOFOL;  Surgeon: Manya Silvas, MD;  Location: [facility] ENDOSCOPY;  Service: Endoscopy;  Laterality: N/A;  . COLONOSCOPY WITH PROPOFOL N/A 02/20/2019   Procedure: COLONOSCOPY WITH PROPOFOL;  Surgeon: Lucilla Lame, MD;  Location: [facility] ENDOSCOPY;  Service: Endoscopy;  Laterality: N/A;  . ESOPHAGOGASTRODUODENOSCOPY (EGD) WITH PROPOFOL N/A 06/10/2018   Procedure: ESOPHAGOGASTRODUODENOSCOPY (EGD) WITH PROPOFOL;  Surgeon: Manya Silvas, MD;  Location: [facility] ENDOSCOPY;  Service: Endoscopy;  Laterality: N/A;  . ESOPHAGOGASTRODUODENOSCOPY (EGD) WITH PROPOFOL N/A 02/20/2019   Procedure: ESOPHAGOGASTRODUODENOSCOPY (EGD) WITH PROPOFOL;  Surgeon: Lucilla Lame, MD;  Location: [facility] ENDOSCOPY;  Service: Endoscopy;  Laterality: N/A;  . NASAL SINUS SURGERY      Prior to Admission medications   Medication Sig Start Date End Date Taking? Authorizing Provider  albuterol (ACCUNEB) 1.25 MG/3ML nebulizer solution Inhale 3 mLs into the lungs every 6 (six) hours as needed for  wheezing.     [provider]  albuterol (PROVENTIL HFA;VENTOLIN HFA) 108 (90 Base) MCG/ACT inhaler Inhale 2 puffs into the lungs every 6 (six) hours as needed for wheezing or shortness of breath.      [provider]  ALPRAZolam Duanne Moron) 0.5 MG tablet Take 0.5 mg by mouth 2 (two) times a day.     [provider]  alum & mag hydroxide-simeth (MAALOX/MYLANTA) 200-200-20 MG/5ML suspension Take 30 mLs by mouth every 4 (four) hours as needed for indigestion or heartburn. 06/08/19   Demetrios Loll, MD  atorvastatin (LIPITOR) 40 MG tablet Take 40 mg by mouth daily. 03/17/19   [provider]  busPIRone (BUSPAR) 10 MG tablet Take 10 mg by mouth 2 (two) times daily.    [provider]  cholecalciferol (VITAMIN D) 1000 units tablet Take 1,000 Units by mouth daily.    [provider]  cloNIDine (CATAPRES) 0.1 MG tablet Take 0.1 mg by mouth 2 (two) times daily as needed. 02/13/19   [provider]  diltiazem (CARDIZEM CD) 180 MG 24 hr capsule Take 1 capsule by mouth daily. 04/07/19   [provider]  ferrous sulfate 325 (65 FE) MG tablet Take 325 mg by mouth daily with breakfast.    [provider]  Fluticasone-Salmeterol (ADVAIR) 250-50 MCG/DOSE AEPB Inhale 1 puff into the lungs 2 (two) times daily.    [provider]  furosemide (LASIX) 20 MG tablet Take 1 tablet (20 mg total) by mouth daily. 06/10/19 06/09/20  Loletha Grayer, MD  hydrALAZINE (APRESOLINE) 10 MG tablet Take 5 tablets (50 mg total) by mouth 3 (three) times daily. 02/21/19   Fritzi Mandes, MD  HYDROcodone-acetaminophen (NORCO/VICODIN) 5-325 MG tablet Take 1 tablet by mouth 2 (two) times daily.  04/26/19   [provider]  levothyroxine (SYNTHROID, LEVOTHROID) 50 MCG tablet Take 50 mcg by mouth daily before breakfast.    [provider]  loratadine (CLARITIN) 10 MG tablet Take 10 mg by mouth daily.    [provider]  losartan (COZAAR) 50 MG tablet Take 2 tablets (100 mg total) by mouth at bedtime. 12/13/18   Loletha Grayer, MD  magnesium oxide (MAG-OX) 400 MG tablet Take 400 mg by mouth 2 (two) times daily.    [provider]   meclizine (ANTIVERT) 25 MG tablet Take 1 tablet (25 mg total) by mouth 2 (two) times daily as needed for dizziness. 09/16/18   Merlyn Lot, MD  meloxicam (MOBIC) 15 MG tablet Take 15 mg by mouth daily. 02/07/19   [provider]  metoprolol succinate (TOPROL-XL) 25 MG 24 hr tablet Take 1 tablet (25 mg total) by mouth daily. 06/10/19   Loletha Grayer, MD  mometasone-formoterol (DULERA) 100-5 MCG/ACT AERO Inhale 2 puffs into the lungs 2 (two) times daily.    [provider]  ondansetron (ZOFRAN-ODT) 4 MG disintegrating tablet Take 1 tablet by mouth every 8 (eight) hours as needed.  05/05/19   [provider]  polyethylene glycol (MIRALAX / GLYCOLAX) 17 g packet Take 17 g by mouth daily. 01/03/19   Earleen Newport, MD  RABEprazole (ACIPHEX) 20 MG tablet Take 20 mg by mouth 2 (two) times daily.    [provider]  venlafaxine (EFFEXOR) 37.5 MG tablet Take 37.5 mg by mouth daily.    [provider]  venlafaxine XR (EFFEXOR-XR) 75 MG 24 hr capsule Take 75 mg by mouth daily.    [provider]    Allergies Diphenhydramine hcl, Penicillins,  Captopril, Eliquis [apixaban], Enalapril maleate, Tape, Terfenadine, Augmentin [amoxicillin-pot clavulanate], and Biaxin [clarithromycin]  Family History  Problem Relation Age of Onset  . Hypertension Mother   . Heart disease Mother   . Cancer Mother   . Stroke Father   . Hypertension Father   . Breast cancer Sister     Social History Social History   Tobacco Use  . Smoking status: Never Smoker  . Smokeless tobacco: Never Used  Substance Use Topics  . Alcohol use: No    Alcohol/week: 0.0 standard drinks  . Drug use: No    Review of Systems Constitutional: No fever/chills. Positive for generalized weakness.  Eyes: No visual changes. ENT: No sore throat. Cardiovascular: Denies chest pain. Respiratory: Denies shortness of breath. Gastrointestinal: No abdominal pain.  No nausea, no  vomiting.  No diarrhea.   Genitourinary: Negative for dysuria. Musculoskeletal: Negative for back pain. Skin: Negative for rash. Neurological: Negative for headaches, focal weakness or numbness.  ____________________________________________   PHYSICAL EXAM:  VITAL SIGNS: ED Triage Vitals  Enc Vitals Group     BP 06/20/19 1026 (!) 187/97     Pulse Rate 06/20/19 1026 91     Resp 06/20/19 1026 17     Temp 06/20/19 1026 98.3 F (36.8 C)     Temp Source 06/20/19 1026 Oral     SpO2 06/20/19 1026 97 %     Weight 06/20/19 1027 154 lb 9.6 oz (70.1 kg)     Height 06/20/19 1027 5' (1.524 m)     Head Circumference --      Peak Flow --      Pain Score 06/20/19 1027 0   Constitutional: Alert and oriented.  Eyes: Conjunctivae are normal.  ENT      Head: Normocephalic and atraumatic.      Nose: No congestion/rhinnorhea.      Mouth/Throat: Mucous membranes are moist.      Neck: No stridor. Hematological/Lymphatic/Immunilogical: No cervical lymphadenopathy. Cardiovascular: Normal rate, regular rhythm.  No murmurs, rubs, or gallops.  Respiratory: Normal respiratory effort without tachypnea nor retractions. Breath sounds are clear and equal bilaterally. No wheezes/rales/rhonchi. Gastrointestinal: Soft and non tender. No rebound. No guarding.  Genitourinary: Deferred Musculoskeletal: Normal range of motion in all extremities. No lower extremity edema. Neurologic:  Normal speech and language. No gross focal neurologic deficits are appreciated.  Skin:  Skin is warm, dry and intact. No rash noted. Psychiatric: Mood and affect are normal. Speech and behavior are normal. Patient exhibits appropriate insight and judgment.  ____________________________________________    LABS (pertinent positives/negatives)  UA clear, unremarkable BMP na 133, k 4.1, co2 25, glu 123, cr 0.87 CBC wbc 10.7, hgb 12.2, plt 276 Trop 21 x 2  ____________________________________________   EKG  I, Nance Pear, attending physician, personally viewed and interpreted this EKG  EKG Time: 1041 Rate: 93 Rhythm: atrial fibrillation Axis: right superior axis Intervals: qtc 469 QRS:  intraventricular conduction block ST changes: no st elevation Impression: abnormal ekg  When compared to EKG dated 06/09/2019 the intraventricular block is new.  ____________________________________________    RADIOLOGY  None  ____________________________________________   PROCEDURES  Procedures  ____________________________________________   INITIAL IMPRESSION / ASSESSMENT AND PLAN / ED COURSE  Pertinent labs & imaging results that were available during my care of the patient were reviewed by me and considered in my medical decision making (see chart for details).   Patient presented with concern for generalized weakness. On exam patient with no focal weakness. The patient  was concerned for hyponatremia. Na today 133. Did note on ekg new non specific intraventricular conduction delay. This is a new finding. Discussed with Dr. Nehemiah Massed with cardiology over the phone. Troponin was stable at 21. At this time felt it would be appropriate to have patient follow up with cardiology as an outpatient. Discussed this with the patient. Will plan on discharging home. Discussed importance of follow up with Dr. Ubaldo Glassing.     ____________________________________________   FINAL CLINICAL IMPRESSION(S) / ED DIAGNOSES  Final diagnoses:  Weakness  Abnormal finding on EKG     Note: This dictation was prepared with Dragon dictation. Any transcriptional errors that result from this process are unintentional     Nance Pear, MD 06/20/19 1601

## 2019-06-20 NOTE — ED Notes (Signed)
Sent a rainbow to lab  ° °

## 2019-06-20 NOTE — Care Management (Signed)
11/3: Patient is active with [facility] for RN and PT. [place] RN CM

## 2019-08-07 ENCOUNTER — Emergency Department: Payer: Medicare Other

## 2019-08-07 ENCOUNTER — Observation Stay
Admission: EM | Admit: 2019-08-07 | Discharge: 2019-08-08 | Disposition: A | Discharge status: Home / Self Care | Payer: Medicare Other | Attending: Internal Medicine | Admitting: Internal Medicine

## 2019-08-07 ENCOUNTER — Encounter: Payer: Self-pay | Admitting: Emergency Medicine

## 2019-08-07 ENCOUNTER — Other Ambulatory Visit: Payer: Self-pay

## 2019-08-07 DIAGNOSIS — Z20828 Contact with and (suspected) exposure to other viral communicable diseases: Secondary | ICD-10-CM | POA: Insufficient documentation

## 2019-08-07 DIAGNOSIS — I674 Hypertensive encephalopathy: Secondary | ICD-10-CM | POA: Diagnosis not present

## 2019-08-07 DIAGNOSIS — Z853 Personal history of malignant neoplasm of breast: Secondary | ICD-10-CM | POA: Diagnosis not present

## 2019-08-07 DIAGNOSIS — I480 Paroxysmal atrial fibrillation: Secondary | ICD-10-CM | POA: Diagnosis not present

## 2019-08-07 DIAGNOSIS — Z7951 Long term (current) use of inhaled steroids: Secondary | ICD-10-CM | POA: Insufficient documentation

## 2019-08-07 DIAGNOSIS — F419 Anxiety disorder, unspecified: Secondary | ICD-10-CM | POA: Insufficient documentation

## 2019-08-07 DIAGNOSIS — J449 Chronic obstructive pulmonary disease, unspecified: Secondary | ICD-10-CM | POA: Insufficient documentation

## 2019-08-07 DIAGNOSIS — R4182 Altered mental status, unspecified: Secondary | ICD-10-CM | POA: Diagnosis present

## 2019-08-07 DIAGNOSIS — E878 Other disorders of electrolyte and fluid balance, not elsewhere classified: Secondary | ICD-10-CM | POA: Insufficient documentation

## 2019-08-07 DIAGNOSIS — Z79890 Hormone replacement therapy (postmenopausal): Secondary | ICD-10-CM | POA: Insufficient documentation

## 2019-08-07 DIAGNOSIS — E785 Hyperlipidemia, unspecified: Secondary | ICD-10-CM | POA: Insufficient documentation

## 2019-08-07 DIAGNOSIS — Z9981 Dependence on supplemental oxygen: Secondary | ICD-10-CM | POA: Diagnosis not present

## 2019-08-07 DIAGNOSIS — I10 Essential (primary) hypertension: Secondary | ICD-10-CM | POA: Diagnosis not present

## 2019-08-07 DIAGNOSIS — J45909 Unspecified asthma, uncomplicated: Secondary | ICD-10-CM | POA: Diagnosis not present

## 2019-08-07 DIAGNOSIS — E039 Hypothyroidism, unspecified: Secondary | ICD-10-CM | POA: Insufficient documentation

## 2019-08-07 DIAGNOSIS — G9340 Encephalopathy, unspecified: Secondary | ICD-10-CM | POA: Diagnosis not present

## 2019-08-07 DIAGNOSIS — E871 Hypo-osmolality and hyponatremia: Principal | ICD-10-CM

## 2019-08-07 DIAGNOSIS — F329 Major depressive disorder, single episode, unspecified: Secondary | ICD-10-CM | POA: Insufficient documentation

## 2019-08-07 DIAGNOSIS — I4891 Unspecified atrial fibrillation: Secondary | ICD-10-CM | POA: Diagnosis not present

## 2019-08-07 DIAGNOSIS — M5416 Radiculopathy, lumbar region: Secondary | ICD-10-CM | POA: Diagnosis not present

## 2019-08-07 DIAGNOSIS — M48062 Spinal stenosis, lumbar region with neurogenic claudication: Secondary | ICD-10-CM | POA: Insufficient documentation

## 2019-08-07 DIAGNOSIS — Z791 Long term (current) use of non-steroidal anti-inflammatories (NSAID): Secondary | ICD-10-CM | POA: Insufficient documentation

## 2019-08-07 DIAGNOSIS — Z79899 Other long term (current) drug therapy: Secondary | ICD-10-CM | POA: Diagnosis not present

## 2019-08-07 LAB — URINALYSIS, COMPLETE (UACMP) WITH MICROSCOPIC
Bacteria, UA: NONE SEEN
Bilirubin Urine: NEGATIVE
Glucose, UA: NEGATIVE mg/dL
Hgb urine dipstick: NEGATIVE
Ketones, ur: NEGATIVE mg/dL
Leukocytes,Ua: NEGATIVE
Nitrite: NEGATIVE
Protein, ur: 30 mg/dL — AB
Specific Gravity, Urine: 1.006 (ref 1.005–1.030)
pH: 7 (ref 5.0–8.0)

## 2019-08-07 LAB — CBC WITH DIFFERENTIAL/PLATELET
Abs Immature Granulocytes: 0.1 10*3/uL — ABNORMAL HIGH (ref 0.00–0.07)
Basophils Absolute: 0.1 10*3/uL (ref 0.0–0.1)
Basophils Relative: 1 %
Eosinophils Absolute: 0.5 10*3/uL (ref 0.0–0.5)
Eosinophils Relative: 6 %
HCT: 43.8 % (ref 36.0–46.0)
Hemoglobin: 15.1 g/dL — ABNORMAL HIGH (ref 12.0–15.0)
Immature Granulocytes: 1 %
Lymphocytes Relative: 26 %
Lymphs Abs: 2.3 10*3/uL (ref 0.7–4.0)
MCH: 31.5 pg (ref 26.0–34.0)
MCHC: 34.5 g/dL (ref 30.0–36.0)
MCV: 91.4 fL (ref 80.0–100.0)
Monocytes Absolute: 1 10*3/uL (ref 0.1–1.0)
Monocytes Relative: 12 %
Neutro Abs: 4.7 10*3/uL (ref 1.7–7.7)
Neutrophils Relative %: 54 %
Platelets: 225 10*3/uL (ref 150–400)
RBC: 4.79 MIL/uL (ref 3.87–5.11)
RDW: 15.3 % (ref 11.5–15.5)
WBC: 8.8 10*3/uL (ref 4.0–10.5)
nRBC: 0 % (ref 0.0–0.2)

## 2019-08-07 LAB — PROTIME-INR
INR: 0.9 (ref 0.8–1.2)
Prothrombin Time: 11.5 seconds (ref 11.4–15.2)

## 2019-08-07 LAB — TROPONIN I (HIGH SENSITIVITY)
Troponin I (High Sensitivity): 11 ng/L (ref <18)
Troponin I (High Sensitivity): 18 ng/L — ABNORMAL HIGH (ref <18)

## 2019-08-07 LAB — COMPREHENSIVE METABOLIC PANEL
ALT: 28 U/L (ref 0–44)
AST: 34 U/L (ref 15–41)
Albumin: 4.6 g/dL (ref 3.5–5.0)
Alkaline Phosphatase: 48 U/L (ref 38–126)
Anion gap: 9 (ref 5–15)
BUN: 18 mg/dL (ref 8–23)
CO2: 28 mmol/L (ref 22–32)
Calcium: 9.8 mg/dL (ref 8.9–10.3)
Chloride: 90 mmol/L — ABNORMAL LOW (ref 98–111)
Creatinine, Ser: 0.85 mg/dL (ref 0.44–1.00)
GFR calc Af Amer: >60 mL/min (ref >60)
GFR calc non Af Amer: >60 mL/min (ref >60)
Glucose, Bld: 112 mg/dL — ABNORMAL HIGH (ref 70–99)
Potassium: 4.2 mmol/L (ref 3.5–5.1)
Sodium: 127 mmol/L — ABNORMAL LOW (ref 135–145)
Total Bilirubin: 0.9 mg/dL (ref 0.3–1.2)
Total Protein: 8 g/dL (ref 6.5–8.1)

## 2019-08-07 LAB — OSMOLALITY: Osmolality: 279 mOsm/kg (ref 275–295)

## 2019-08-07 LAB — NA AND K (SODIUM & POTASSIUM), RAND UR
Potassium Urine: 18 mmol/L
Sodium, Ur: 40 mmol/L

## 2019-08-07 LAB — OSMOLALITY, URINE: Osmolality, Ur: 330 mOsm/kg (ref 300–900)

## 2019-08-07 LAB — TSH: TSH: 3.481 u[IU]/mL (ref 0.350–4.500)

## 2019-08-07 LAB — BRAIN NATRIURETIC PEPTIDE: B Natriuretic Peptide: 177 pg/mL — ABNORMAL HIGH (ref 0.0–100.0)

## 2019-08-07 MED ORDER — ALBUTEROL SULFATE HFA 108 (90 BASE) MCG/ACT IN AERS
2.0000 | INHALATION_SPRAY | Freq: Four times a day (QID) | RESPIRATORY_TRACT | Status: DC | PRN
  Filled 2019-08-07: qty 6.7

## 2019-08-07 MED ORDER — HYDRALAZINE HCL 20 MG/ML IJ SOLN
10.0000 mg | Freq: Four times a day (QID) | INTRAMUSCULAR | Status: DC | PRN
  Filled 2019-08-07: qty 0.5
  Filled 2019-08-07: qty 1

## 2019-08-07 MED ORDER — POLYETHYLENE GLYCOL 3350 17 G PO PACK
17.0000 g | PACK | Freq: Every day | ORAL | Status: DC
  Administered 2019-08-08: 17 g via ORAL
  Filled 2019-08-07: qty 1

## 2019-08-07 MED ORDER — VENLAFAXINE HCL ER 75 MG PO CP24
75.0000 mg | ORAL_CAPSULE | Freq: Every day | ORAL | Status: DC
  Administered 2019-08-08: 75 mg via ORAL
  Filled 2019-08-07: qty 1

## 2019-08-07 MED ORDER — LABETALOL HCL 5 MG/ML IV SOLN
20.0000 mg | INTRAVENOUS | Status: DC | PRN
  Filled 2019-08-07: qty 4

## 2019-08-07 MED ORDER — VENLAFAXINE HCL 37.5 MG PO TABS
37.5000 mg | ORAL_TABLET | Freq: Every day | ORAL | Status: DC
  Filled 2019-08-07: qty 1

## 2019-08-07 MED ORDER — ACETAMINOPHEN 650 MG RE SUPP: 650.0000 mg | Freq: Four times a day (QID) | RECTAL | Status: DC | PRN

## 2019-08-07 MED ORDER — ACETAMINOPHEN 325 MG PO TABS
650.0000 mg | ORAL_TABLET | Freq: Four times a day (QID) | ORAL | Status: DC | PRN
  Administered 2019-08-08: 650 mg via ORAL
  Filled 2019-08-07: qty 2

## 2019-08-07 MED ORDER — FERROUS SULFATE 325 (65 FE) MG PO TABS
325.0000 mg | ORAL_TABLET | Freq: Every day | ORAL | Status: DC
  Administered 2019-08-08: 325 mg via ORAL
  Filled 2019-08-07: qty 1

## 2019-08-07 MED ORDER — LORATADINE 10 MG PO TABS
10.0000 mg | ORAL_TABLET | Freq: Every day | ORAL | Status: DC
  Administered 2019-08-08: 10 mg via ORAL
  Filled 2019-08-07: qty 1

## 2019-08-07 MED ORDER — SODIUM CHLORIDE 0.9 % IV BOLUS
500.0000 mL | Freq: Once | INTRAVENOUS | Status: AC
  Administered 2019-08-07: 500 mL via INTRAVENOUS

## 2019-08-07 MED ORDER — TRAZODONE HCL 50 MG PO TABS
25.0000 mg | ORAL_TABLET | Freq: Every evening | ORAL | Status: DC | PRN
  Filled 2019-08-07: qty 0.5

## 2019-08-07 MED ORDER — HYDROCODONE-ACETAMINOPHEN 5-325 MG PO TABS
1.0000 | ORAL_TABLET | Freq: Two times a day (BID) | ORAL | Status: DC
  Administered 2019-08-08: 1 via ORAL
  Filled 2019-08-07: qty 1

## 2019-08-07 MED ORDER — SODIUM CHLORIDE 0.9 % IV SOLN: INTRAVENOUS | Status: DC

## 2019-08-07 MED ORDER — MAGNESIUM OXIDE 400 (241.3 MG) MG PO TABS
400.0000 mg | ORAL_TABLET | Freq: Two times a day (BID) | ORAL | Status: DC
  Administered 2019-08-08: 400 mg via ORAL
  Filled 2019-08-07: qty 1

## 2019-08-07 MED ORDER — DILTIAZEM HCL ER COATED BEADS 180 MG PO CP24
180.0000 mg | ORAL_CAPSULE | Freq: Every day | ORAL | Status: DC
  Administered 2019-08-08: 180 mg via ORAL
  Filled 2019-08-07: qty 1

## 2019-08-07 MED ORDER — BUSPIRONE HCL 10 MG PO TABS
10.0000 mg | ORAL_TABLET | Freq: Two times a day (BID) | ORAL | Status: DC
  Administered 2019-08-08: 10 mg via ORAL
  Filled 2019-08-07 (×2): qty 1

## 2019-08-07 MED ORDER — ALBUTEROL SULFATE (2.5 MG/3ML) 0.083% IN NEBU: 3.0000 mL | INHALATION_SOLUTION | Freq: Four times a day (QID) | RESPIRATORY_TRACT | Status: DC | PRN

## 2019-08-07 MED ORDER — MAGNESIUM HYDROXIDE 400 MG/5ML PO SUSP: 30.0000 mL | Freq: Every day | ORAL | Status: DC | PRN

## 2019-08-07 MED ORDER — ATORVASTATIN CALCIUM 20 MG PO TABS
40.0000 mg | ORAL_TABLET | Freq: Every day | ORAL | Status: DC
  Administered 2019-08-08: 40 mg via ORAL
  Filled 2019-08-07 (×2): qty 2

## 2019-08-07 MED ORDER — VITAMIN D 25 MCG (1000 UNIT) PO TABS
1000.0000 [IU] | ORAL_TABLET | Freq: Every day | ORAL | Status: DC
  Administered 2019-08-08: 1000 [IU] via ORAL
  Filled 2019-08-07: qty 1

## 2019-08-07 MED ORDER — FUROSEMIDE 20 MG PO TABS
20.0000 mg | ORAL_TABLET | Freq: Every day | ORAL | Status: DC
  Administered 2019-08-08: 20 mg via ORAL
  Filled 2019-08-07 (×2): qty 1

## 2019-08-07 MED ORDER — ENOXAPARIN SODIUM 40 MG/0.4ML ~~LOC~~ SOLN
40.0000 mg | SUBCUTANEOUS | Status: DC
  Administered 2019-08-08: 40 mg via SUBCUTANEOUS
  Filled 2019-08-07: qty 0.4

## 2019-08-07 MED ORDER — PANTOPRAZOLE SODIUM 40 MG PO TBEC
40.0000 mg | DELAYED_RELEASE_TABLET | Freq: Every day | ORAL | Status: DC
  Administered 2019-08-08: 40 mg via ORAL
  Filled 2019-08-07: qty 1

## 2019-08-07 MED ORDER — METOPROLOL TARTRATE 5 MG/5ML IV SOLN
5.0000 mg | Freq: Once | INTRAVENOUS | Status: AC
  Administered 2019-08-07: 5 mg via INTRAVENOUS
  Filled 2019-08-07: qty 5

## 2019-08-07 MED ORDER — LEVOTHYROXINE SODIUM 50 MCG PO TABS
50.0000 ug | ORAL_TABLET | Freq: Every day | ORAL | Status: DC
  Administered 2019-08-08: 50 ug via ORAL
  Filled 2019-08-07: qty 1

## 2019-08-07 MED ORDER — ONDANSETRON 4 MG PO TBDP
4.0000 mg | ORAL_TABLET | Freq: Three times a day (TID) | ORAL | Status: DC | PRN
  Filled 2019-08-07: qty 1

## 2019-08-07 MED ORDER — ALUM & MAG HYDROXIDE-SIMETH 200-200-20 MG/5ML PO SUSP: 30.0000 mL | ORAL | Status: DC | PRN

## 2019-08-07 MED ORDER — LOSARTAN POTASSIUM 50 MG PO TABS
100.0000 mg | ORAL_TABLET | Freq: Every day | ORAL | Status: DC
  Administered 2019-08-07: 100 mg via ORAL
  Filled 2019-08-07: qty 2

## 2019-08-07 MED ORDER — ALPRAZOLAM 0.5 MG PO TABS
0.5000 mg | ORAL_TABLET | Freq: Two times a day (BID) | ORAL | Status: DC
  Administered 2019-08-08: 0.5 mg via ORAL
  Filled 2019-08-07: qty 1

## 2019-08-07 MED ORDER — HYDRALAZINE HCL 50 MG PO TABS
50.0000 mg | ORAL_TABLET | Freq: Three times a day (TID) | ORAL | Status: DC
  Administered 2019-08-07 – 2019-08-08 (×2): 50 mg via ORAL
  Filled 2019-08-07 (×4): qty 1

## 2019-08-07 MED ORDER — CLONIDINE HCL 0.1 MG PO TABS
0.1000 mg | ORAL_TABLET | Freq: Three times a day (TID) | ORAL | Status: DC
  Administered 2019-08-07 – 2019-08-08 (×2): 0.1 mg via ORAL
  Filled 2019-08-07 (×3): qty 1

## 2019-08-07 MED ORDER — MECLIZINE HCL 12.5 MG PO TABS
12.5000 mg | ORAL_TABLET | Freq: Two times a day (BID) | ORAL | Status: DC | PRN
  Filled 2019-08-07: qty 1

## 2019-08-07 NOTE — ED Notes (Signed)
Admitting MD at bedside at this time.

## 2019-08-07 NOTE — ED Provider Notes (Signed)
[facility] Emergency Department Provider Note ____________________________________________   First MD Initiated Contact with Patient 08/07/19 1834     (approximate)  I have reviewed the triage vital signs and the nursing notes.   HISTORY  Chief Complaint Altered Mental Status  Level 5 caveat: History present illness limited due to poor historian, possible altered mental status  HPI Janice Perkins is a 85 y.o. female with PMH as noted below who presents with apparent altered mental status.  Per EMS, the patient took a nap today when she awoke she was confused.  Family reported that this is happened to the patient previously.  The patient herself states she thinks she is here because her blood pressure was high.  She denies any pain.  However, she is not really able to give much other history.  Past Medical History:  Diagnosis Date  . Asthma   . Atrial fibrillation ([place])   . Breast cancer ([place]) 2011  . Bronchitis 04/2015  . Cancer [facility]) 2011   breast  . COPD (chronic obstructive pulmonary disease) ([place])   . Hypertension   . Shingles    10/2015    Patient Active Problem List   Diagnosis Date Noted  . Fluid overload 06/09/2019  . Bronchitis 05/31/2019  . Hypothyroidism 05/31/2019  . Duodenum ulcer   . Melena   . GI bleed 02/17/2019  . Hypertensive encephalopathy 12/11/2018  . Hypoxia 05/30/2018  . Leukocytosis 04/05/2018  . Pneumonia 11/15/2017  . Pleural effusion 05/16/2017  . AF (paroxysmal atrial fibrillation) ([place]) 05/16/2017  . Breast cancer ([place]) 05/16/2017  . Dependence on nocturnal oxygen therapy 05/16/2017  . HTN (hypertension) 05/16/2017  . Generalized weakness 03/07/2017  . Hyponatremia 03/07/2017  . Dehydration 03/07/2017  . UTI (urinary tract infection) 03/07/2017  . HCAP (healthcare-associated pneumonia) 01/19/2017  . AKI (acute kidney injury) ([place]) 12/18/2016  . Primary cancer of upper outer quadrant of right female breast  ([place]) 04/07/2016  . Lumbar radiculopathy 03/23/2016  . Spinal stenosis, lumbar region, with neurogenic claudication 03/23/2016  . DDD (degenerative disc disease), lumbar 01/14/2015  . Lumbosacral facet joint syndrome 01/14/2015  . Sacroiliac joint dysfunction 01/14/2015  . Greater trochanteric bursitis 01/14/2015  . Bilateral occipital neuralgia 01/14/2015    Past Surgical History:  Procedure Laterality Date  . BREAST EXCISIONAL BIOPSY Right 11/29/13   two areas FAT NECROSIS  . BREAST EXCISIONAL BIOPSY Right 10/30/2009   lumpectomy - radiation  . COLONOSCOPY WITH PROPOFOL N/A 06/10/2018   Procedure: COLONOSCOPY WITH PROPOFOL;  Surgeon: Manya Silvas, MD;  Location[facility] ENDOSCOPY;  Service: Endoscopy;  Laterality: N/A;  . COLONOSCOPY WITH PROPOFOL N/A 02/20/2019   Procedure: COLONOSCOPY WITH PROPOFOL;  Surgeon: Lucilla Lame, MD;  Location: [facility] ENDOSCOPY;  Service: Endoscopy;  Laterality: N/A;  . ESOPHAGOGASTRODUODENOSCOPY (EGD) WITH PROPOFOL N/A 06/10/2018   Procedure: ESOPHAGOGASTRODUODENOSCOPY (EGD) WITH PROPOFOL;  Surgeon: Manya Silvas, MD;  Location: [facility] ENDOSCOPY;  Service: Endoscopy;  Laterality: N/A;  . ESOPHAGOGASTRODUODENOSCOPY (EGD) WITH PROPOFOL N/A 02/20/2019   Procedure: ESOPHAGOGASTRODUODENOSCOPY (EGD) WITH PROPOFOL;  Surgeon: Lucilla Lame, MD;  Location: [facility] ENDOSCOPY;  Service: Endoscopy;  Laterality: N/A;  . NASAL SINUS SURGERY      Prior to Admission medications   Medication Sig Start Date End Date Taking? Authorizing Provider  albuterol (ACCUNEB) 1.25 MG/3ML nebulizer solution Inhale 3 mLs into the lungs every 6 (six) hours as needed for wheezing.     [provider]  albuterol (PROVENTIL HFA;VENTOLIN HFA) 108 (90 Base) MCG/ACT inhaler Inhale  2 puffs into the lungs every 6 (six) hours as needed for wheezing or shortness of breath.     [provider]  ALPRAZolam Duanne Moron) 0.5 MG tablet Take 0.5 mg by mouth 2 (two) times a day.     [provider]  alum & mag hydroxide-simeth (MAALOX/MYLANTA) 200-200-20 MG/5ML suspension Take 30 mLs by mouth every 4 (four) hours as needed for indigestion or heartburn. 06/08/19   Demetrios Loll, MD  atorvastatin (LIPITOR) 40 MG tablet Take 40 mg by mouth daily. 03/17/19   [provider]  busPIRone (BUSPAR) 10 MG tablet Take 10 mg by mouth 2 (two) times daily.    [provider]  cholecalciferol (VITAMIN D) 1000 units tablet Take 1,000 Units by mouth daily.    [provider]  cloNIDine (CATAPRES) 0.1 MG tablet Take 0.1 mg by mouth 2 (two) times daily as needed. 02/13/19   [provider]  diltiazem (CARDIZEM CD) 180 MG 24 hr capsule Take 1 capsule by mouth daily. 04/07/19   [provider]  ferrous sulfate 325 (65 FE) MG tablet Take 325 mg by mouth daily with breakfast.    [provider]  Fluticasone-Salmeterol (ADVAIR) 250-50 MCG/DOSE AEPB Inhale 1 puff into the lungs 2 (two) times daily.    [provider]  furosemide (LASIX) 20 MG tablet Take 1 tablet (20 mg total) by mouth daily. 06/10/19 06/09/20  Loletha Grayer, MD  hydrALAZINE (APRESOLINE) 10 MG tablet Take 5 tablets (50 mg total) by mouth 3 (three) times daily. 02/21/19   Fritzi Mandes, MD  HYDROcodone-acetaminophen (NORCO/VICODIN) 5-325 MG tablet Take 1 tablet by mouth 2 (two) times daily.  04/26/19   [provider]  levothyroxine (SYNTHROID, LEVOTHROID) 50 MCG tablet Take 50 mcg by mouth daily before breakfast.    [provider]  loratadine (CLARITIN) 10 MG tablet Take 10 mg by mouth daily.    [provider]  losartan (COZAAR) 50 MG tablet Take 2 tablets (100 mg total) by mouth at bedtime. 12/13/18   Loletha Grayer, MD  magnesium oxide (MAG-OX) 400 MG tablet Take 400 mg by mouth 2 (two) times daily.    [provider]  meclizine (ANTIVERT) 25 MG tablet Take 1 tablet (25 mg total) by mouth 2 (two) times daily as needed for dizziness. 09/16/18    Merlyn Lot, MD  meloxicam (MOBIC) 15 MG tablet Take 15 mg by mouth daily. 02/07/19   [provider]  metoprolol succinate (TOPROL-XL) 25 MG 24 hr tablet Take 1 tablet (25 mg total) by mouth daily. 06/10/19   Loletha Grayer, MD  mometasone-formoterol (DULERA) 100-5 MCG/ACT AERO Inhale 2 puffs into the lungs 2 (two) times daily.    [provider]  ondansetron (ZOFRAN-ODT) 4 MG disintegrating tablet Take 1 tablet by mouth every 8 (eight) hours as needed.  05/05/19   [provider]  polyethylene glycol (MIRALAX / GLYCOLAX) 17 g packet Take 17 g by mouth daily. 01/03/19   Earleen Newport, MD  RABEprazole (ACIPHEX) 20 MG tablet Take 20 mg by mouth 2 (two) times daily.    [provider]  venlafaxine (EFFEXOR) 37.5 MG tablet Take 37.5 mg by mouth daily.    [provider]  venlafaxine XR (EFFEXOR-XR) 75 MG 24 hr capsule Take 75 mg by mouth daily.    [provider]    Allergies Diphenhydramine hcl, Penicillins, Captopril, Eliquis [apixaban], Enalapril maleate, Tape, Terfenadine, Augmentin [amoxicillin-pot clavulanate], and Biaxin [clarithromycin]  Family History  Problem Relation  Age of Onset  . Hypertension Mother   . Heart disease Mother   . Cancer Mother   . Stroke Father   . Hypertension Father   . Breast cancer Sister     Social History Social History   Tobacco Use  . Smoking status: Never Smoker  . Smokeless tobacco: Never Used  Substance Use Topics  . Alcohol use: No    Alcohol/week: 0.0 standard drinks  . Drug use: No    Review of Systems Level 5 caveat: Unable to obtain review of systems due to poor historian, possible altered mental status   ____________________________________________   PHYSICAL EXAM:  VITAL SIGNS: ED Triage Vitals  Enc Vitals Group     BP 08/07/19 1831 (!) 204/120     Pulse Rate 08/07/19 1831 98     Resp 08/07/19 1831 18     Temp 08/07/19 1831 98.1 F (36.7 C)     Temp Source  08/07/19 1831 Oral     SpO2 08/07/19 1831 94 %     Weight 08/07/19 1831 150 lb (68 kg)     Height 08/07/19 1831 5\' 1"  (1.549 m)     Head Circumference --      Peak Flow --      Pain Score 08/07/19 1829 0     Pain Loc --      Pain Edu? --      Excl. in [place]? --     Constitutional: Alert and oriented x4.  Slightly weak appearing but in no acute distress. Eyes: Conjunctivae are normal.  Head: Atraumatic. Nose: No congestion/rhinnorhea. Mouth/Throat: Mucous membranes are moist.   Neck: Normal range of motion.  Cardiovascular: Tachycardic, irregular rhythm. Grossly normal heart sounds.  Good peripheral circulation. Respiratory: Normal respiratory effort.  No retractions.  Decreased breath sounds bilaterally. Gastrointestinal: Soft and nontender. No distention.  Genitourinary: No flank tenderness. Musculoskeletal: No lower extremity edema.  Extremities warm and well perfused.  Neurologic:  Normal speech and language.  Motor and sensory intact in all extremities.   Skin:  Skin is warm and dry. No rash noted. Psychiatric: Somewhat flat affect.  ____________________________________________   LABS (all labs ordered are listed, but only abnormal results are displayed)  Labs Reviewed  COMPREHENSIVE METABOLIC PANEL - Abnormal; Notable for the following components:      Result Value   Sodium 127 (*)    Chloride 90 (*)    Glucose, Bld 112 (*)    All other components within normal limits  CBC WITH DIFFERENTIAL/PLATELET - Abnormal; Notable for the following components:   Hemoglobin 15.1 (*)    Abs Immature Granulocytes 0.10 (*)    All other components within normal limits  BRAIN NATRIURETIC PEPTIDE - Abnormal; Notable for the following components:   B Natriuretic Peptide 177.0 (*)    All other components within normal limits  URINALYSIS, COMPLETE (UACMP) WITH MICROSCOPIC - Abnormal; Notable for the following components:   Color, Urine STRAW (*)    APPearance CLEAR (*)    Protein, ur 30  (*)    All other components within normal limits  SARS CORONAVIRUS 2 (TAT 6-24 HRS)  PROTIME-INR  TROPONIN I (HIGH SENSITIVITY)   ____________________________________________  EKG  ED ECG REPORT I, Arta Silence, the attending physician, personally viewed and interpreted this ECG.  Date: 08/07/2019 EKG Time: 1831 Rate: 100 Rhythm: Atrial fibrillation with occasional PVCs QRS Axis: normal Intervals: Nonspecific IVCD ST/T Wave abnormalities: Nonspecific anterior ST abnormalities Narrative Interpretation: Nonspecific ST abnormalities and IVCD with  no significant change when compared to EKG of 06/20/2019   ____________________________________________  RADIOLOGY  CXR: Bilateral hazy basilar opacities CT head: No acute abnormality  ____________________________________________   PROCEDURES  Procedure(s) performed: No  Procedures  Critical Care performed: Yes  CRITICAL CARE Performed by: Arta Silence   Total critical care time: 30 minutes  Critical care time was exclusive of separately billable procedures and treating other patients.  Critical care was necessary to treat or prevent imminent or life-threatening deterioration.  Critical care was time spent personally by me on the following activities: development of treatment plan with patient and/or surrogate as well as nursing, discussions with consultants, evaluation of patient's response to treatment, examination of patient, obtaining history from patient or surrogate, ordering and performing treatments and interventions, ordering and review of laboratory studies, ordering and review of radiographic studies, pulse oximetry and re-evaluation of patient's condition. ____________________________________________   INITIAL IMPRESSION / ASSESSMENT AND PLAN / ED COURSE  Pertinent labs & imaging results that were available during my care of the patient were reviewed by me and considered in my medical decision  making (see chart for details).  85 year old female with PMH as noted above including COPD, atrial fibrillation, and hypertension who presents with apparent altered mental status after waking up from a nap, as well as elevated blood pressure.  I reviewed the past medical records in [place].  The patient was seen in the ED for an episode of weakness about 6 weeks ago, and previously was admitted 2 months ago with acute pulmonary edema due to CHF.  On exam today, the patient is alert and is oriented x4, but does appear somewhat confused and delayed in answering questions.  When I ask her more than basic questions, she says "I just can't think straight" but denies pain.  Neurologic exam is nonfocal.  She is significantly hypertensive with otherwise normal vital signs.  EKG shows IVCD and atrial fibrillation with rate in the low 100s.  The computer read is acute MI, however it does not meet STEMI criteria and there are no significant changes when compared to her EKG from last month.  Differential includes UTI or other infection, fluid overload, metabolic etiology, ACS, hypertensive encephalopathy, or other CNS cause.  We will obtain chest x-ray, CT head, lab work-up, give metoprolol for rate control and additional IV antihypertensives as needed.  I anticipate that the patient will need admission.  ----------------------------------------- 8:12 PM on 08/07/2019 -----------------------------------------  Blood pressure is markedly improved after metoprolol.  CT head shows no acute abnormality.  The troponin is negative and the BNP is also not significantly elevated.  Overall I suspect some component of hypertensive encephalopathy, along with hyponatremia.  I discussed the case with Dr. Sidney Ace to arrange admission to the hospital service.  _________________________  Octavio Graves was evaluated in Emergency Department on 08/07/2019 for the symptoms described in the history of present illness. She was  evaluated in the context of the global COVID-19 pandemic, which necessitated consideration that the patient might be at risk for infection with the SARS-CoV-2 virus that causes COVID-19. Institutional protocols and algorithms that pertain to the evaluation of patients at risk for COVID-19 are in a state of rapid change based on information released by regulatory bodies including the CDC and federal and state organizations. These policies and algorithms were followed during the patient's care in the ED.  ____________________________________________   FINAL CLINICAL IMPRESSION(S) / ED DIAGNOSES  Final diagnoses:  Hyponatremia  Hypertension, unspecified type  Altered mental  status, unspecified altered mental status type      NEW MEDICATIONS STARTED DURING THIS VISIT:  New Prescriptions   No medications on file     Note:  This document was prepared using Dragon voice recognition software and may include unintentional dictation errors.    Arta Silence, MD 08/07/19 2013

## 2019-08-07 NOTE — ED Notes (Signed)
This Rn updated patient's husband. Pt's husband and daughter unable to state which meds patient has already taken today, states has had daily meds but unsure which ones, and has some nights meds, but also unsure which ones patient takes at night.

## 2019-08-07 NOTE — ED Notes (Signed)
Message sent to pharmacy regarding patient medications.  

## 2019-08-07 NOTE — ED Notes (Addendum)
Pt is noted poor historian, unable to state which home meds she has already taken today and which ones she has not. Pt's husband also unable to state which meds patient has taken and which ones patient has not taken today. This RN discussed with Dr. Sidney Ace due to being unable to determine which meds patient would be duplicating, hold other home meds except BP medications for tonight and restart in the morning.

## 2019-08-07 NOTE — ED Triage Notes (Signed)
Pt presents to ED via [facility] with c/o AMS from home. Per EMS pt took nap today and when she awoke form her nap she was more confused than normal. Per EMS family reports this has happened before, however episodes are intermittent and infrequent. Per EMS pt is alert and oriented, however notes a delay in understanding and processing information. EKG showed L bundle w/ a-fib.   BP 200/100 CBG 138 97.4 HR 80-120

## 2019-08-07 NOTE — H&P (Signed)
[place] [facility] [place] NAME: Janice Perkins    MR#:  [id number]  DATE OF BIRTH:  1932/12/22  DATE OF ADMISSION:  08/07/2019  PRIMARY CARE PHYSICIAN: [name], MD   REQUESTING/REFERRING PHYSICIAN: Arta Silence, MD  CHIEF COMPLAINT:   Chief Complaint  Patient presents with  . Altered Mental Status    HISTORY OF PRESENT ILLNESS:  Janice Perkins  is a 85 y.o. Caucasian female with a known history of asthma, atrial fibrillation, COPD and hypertension, who presented to the emergency room with acute onset of altered mental status with confusion that started this afternoon.  The patient admitted to headache and mild dizziness with mild palpitations.  She denies any chest pain or dyspnea or cough or wheezing or hemoptysis.  No nausea or vomiting or abdominal pain.  No paresthesias or focal muscle weakness.  No dysuria, oliguria or hematuria or flank pain.  Fever or chills.  Upon presentation to the emergency room, blood pressure was 201/106 with otherwise normal vital signs.  Labs were remarkable for hyponatremia with sodium of 127 and hypochloremia with chloride of 90.  BNP was 177 and troponin I 11 L 18.  CBC showed mild hemoconcentration.  Urinalysis showed specific gravity 1006 and 30 protein.  Head CT scan without contrast revealed no acute intracranial normalities.  It did show chronic microvascular ischemic changes in the cerebral white matter.  Portable chest x-ray showed pulmonary vascular congestion and left shoulder prosthesis.  EKG showed with a rate of 101 with widened QRS complexes patient's pulse symmetry is dropped to 88% while sleeping was placed on 2 L of O2 nightly.  She received 5 mg of IV Lopressor and 500 mill of IV normal saline bolus in the ER.  He will be admitted to an observation medical monitored bed for further evaluation and management. PAST MEDICAL HISTORY:   Past Medical History:  Diagnosis Date  . Asthma   . Atrial  fibrillation ([place])   . Breast cancer ([place]) 2011  . Bronchitis 04/2015  . Cancer [facility]) 2011   breast  . COPD (chronic obstructive pulmonary disease) ([place])   . Hypertension   . Shingles    10/2015    PAST SURGICAL HISTORY:   Past Surgical History:  Procedure Laterality Date  . BREAST EXCISIONAL BIOPSY Right 11/29/13   two areas FAT NECROSIS  . BREAST EXCISIONAL BIOPSY Right 10/30/2009   lumpectomy - radiation  . COLONOSCOPY WITH PROPOFOL N/A 06/10/2018   Procedure: COLONOSCOPY WITH PROPOFOL;  Surgeon: Manya Silvas, MD;  Location: [facility] ENDOSCOPY;  Service: Endoscopy;  Laterality: N/A;  . COLONOSCOPY WITH PROPOFOL N/A 02/20/2019   Procedure: COLONOSCOPY WITH PROPOFOL;  Surgeon: Lucilla Lame, MD;  Location: [facility] ENDOSCOPY;  Service: Endoscopy;  Laterality: N/A;  . ESOPHAGOGASTRODUODENOSCOPY (EGD) WITH PROPOFOL N/A 06/10/2018   Procedure: ESOPHAGOGASTRODUODENOSCOPY (EGD) WITH PROPOFOL;  Surgeon: Manya Silvas, MD;  Location: [facility] ENDOSCOPY;  Service: Endoscopy;  Laterality: N/A;  . ESOPHAGOGASTRODUODENOSCOPY (EGD) WITH PROPOFOL N/A 02/20/2019   Procedure: ESOPHAGOGASTRODUODENOSCOPY (EGD) WITH PROPOFOL;  Surgeon: Lucilla Lame, MD;  Location: [facility] ENDOSCOPY;  Service: Endoscopy;  Laterality: N/A;  . NASAL SINUS SURGERY      SOCIAL HISTORY:   Social History   Tobacco Use  . Smoking status: Never Smoker  . Smokeless tobacco: Never Used  Substance Use Topics  . Alcohol use: No    Alcohol/week: 0.0 standard drinks    FAMILY HISTORY:   Family History  Problem Relation Age of Onset  .  Hypertension Mother   . Heart disease Mother   . Cancer Mother   . Stroke Father   . Hypertension Father   . Breast cancer Sister     DRUG ALLERGIES:   Allergies  Allergen Reactions  . Diphenhydramine Hcl Rash  . Penicillins Hives    .Has patient had a PCN reaction causing immediate rash, facial/tongue/throat swelling, SOB or lightheadedness with hypotension: Unknown Has patient had a PCN  reaction causing severe rash involving mucus membranes or skin necrosis: Unknown Has patient had a PCN reaction that required hospitalization: Unknown Has patient had a PCN reaction occurring within the last 10 years: Unknown If all of the above answers are "NO", then may proceed with Cephalosporin use.   . Captopril Other (See Comments)  . Eliquis [Apixaban] Other (See Comments)    Stomach bleed  . Enalapril Maleate Other (See Comments)    Other reaction(s): Unknown  . Tape Itching  . Terfenadine Other (See Comments)  . Augmentin [Amoxicillin-Pot Clavulanate] Rash    Has patient had a PCN reaction causing immediate rash, facial/tongue/throat swelling, SOB or lightheadedness with hypotension: Unknown Has patient had a PCN reaction causing severe rash involving mucus membranes or skin necrosis: Unknown Has patient had a PCN reaction that required hospitalization: Unknown Has patient had a PCN reaction occurring within the last 10 years: Unknown If all of the above answers are "NO", then may proceed with Cephalosporin use.  . Biaxin [Clarithromycin] Rash, Other (See Comments) and Hives    Other reaction(s): Unknown    REVIEW OF SYSTEMS:   As per history of present illness. All pertinent systems were reviewed above. Constitutional,  HEENT, cardiovascular, respiratory, GI, GU, musculoskeletal, neuro, psychiatric, endocrine,  integumentary and hematologic systems were reviewed and are otherwise  negative/unremarkable except for positive findings mentioned above in the HPI.   MEDICATIONS AT HOME:   Prior to Admission medications   Medication Sig Start Date End Date Taking? Authorizing Provider  albuterol (ACCUNEB) 1.25 MG/3ML nebulizer solution Inhale 3 mLs into the lungs every 6 (six) hours as needed for wheezing.     [provider]  albuterol (PROVENTIL HFA;VENTOLIN HFA) 108 (90 Base) MCG/ACT inhaler Inhale 2 puffs into the lungs every 6 (six) hours as needed for wheezing or  shortness of breath.     [provider]  ALPRAZolam Duanne Moron) 0.5 MG tablet Take 0.5 mg by mouth 2 (two) times a day.     [provider]  alum & mag hydroxide-simeth (MAALOX/MYLANTA) 200-200-20 MG/5ML suspension Take 30 mLs by mouth every 4 (four) hours as needed for indigestion or heartburn. 06/08/19   Demetrios Loll, MD  atorvastatin (LIPITOR) 40 MG tablet Take 40 mg by mouth daily. 03/17/19   [provider]  busPIRone (BUSPAR) 10 MG tablet Take 10 mg by mouth 2 (two) times daily.    [provider]  cholecalciferol (VITAMIN D) 1000 units tablet Take 1,000 Units by mouth daily.    [provider]  cloNIDine (CATAPRES) 0.1 MG tablet Take 0.1 mg by mouth 2 (two) times daily as needed. 02/13/19   [provider]  diltiazem (CARDIZEM CD) 180 MG 24 hr capsule Take 1 capsule by mouth daily. 04/07/19   [provider]  ferrous sulfate 325 (65 FE) MG tablet Take 325 mg by mouth daily with breakfast.    [provider]  Fluticasone-Salmeterol (ADVAIR) 250-50 MCG/DOSE AEPB Inhale 1 puff into the lungs 2 (two) times daily.    [provider]  furosemide (  LASIX) 20 MG tablet Take 1 tablet (20 mg total) by mouth daily. 06/10/19 06/09/20  Loletha Grayer, MD  hydrALAZINE (APRESOLINE) 10 MG tablet Take 5 tablets (50 mg total) by mouth 3 (three) times daily. 02/21/19   Fritzi Mandes, MD  HYDROcodone-acetaminophen (NORCO/VICODIN) 5-325 MG tablet Take 1 tablet by mouth 2 (two) times daily.  04/26/19   [provider]  levothyroxine (SYNTHROID, LEVOTHROID) 50 MCG tablet Take 50 mcg by mouth daily before breakfast.    [provider]  loratadine (CLARITIN) 10 MG tablet Take 10 mg by mouth daily.    [provider]  losartan (COZAAR) 50 MG tablet Take 2 tablets (100 mg total) by mouth at bedtime. 12/13/18   Loletha Grayer, MD  magnesium oxide (MAG-OX) 400 MG tablet Take 400 mg by mouth 2 (two) times daily.    [provider]  meclizine (ANTIVERT) 25 MG tablet Take 1 tablet (25 mg total) by mouth 2 (two) times daily as needed for dizziness. 09/16/18   Merlyn Lot, MD  meloxicam (MOBIC) 15 MG tablet Take 15 mg by mouth daily. 02/07/19   [provider]  metoprolol succinate (TOPROL-XL) 25 MG 24 hr tablet Take 1 tablet (25 mg total) by mouth daily. 06/10/19   Loletha Grayer, MD  mometasone-formoterol (DULERA) 100-5 MCG/ACT AERO Inhale 2 puffs into the lungs 2 (two) times daily.    [provider]  ondansetron (ZOFRAN-ODT) 4 MG disintegrating tablet Take 1 tablet by mouth every 8 (eight) hours as needed.  05/05/19   [provider]  polyethylene glycol (MIRALAX / GLYCOLAX) 17 g packet Take 17 g by mouth daily. 01/03/19   Earleen Newport, MD  RABEprazole (ACIPHEX) 20 MG tablet Take 20 mg by mouth 2 (two) times daily.    [provider]  venlafaxine (EFFEXOR) 37.5 MG tablet Take 37.5 mg by mouth daily.    [provider]  venlafaxine XR (EFFEXOR-XR) 75 MG 24 hr capsule Take 75 mg by mouth daily.    [provider]      VITAL SIGNS:  Blood pressure (!) 157/86, pulse 66, temperature 98.1 F (36.7 C), temperature source Oral, resp. rate 20, height 5\' 1"  (1.549 m), weight 68 kg, SpO2 95 %.  PHYSICAL EXAMINATION:  Physical Exam  GENERAL:  85 y.o.-year-old patient lying in the bed with no acute distress.  EYES: Pupils equal, round, reactive to light and accommodation. No scleral icterus. Extraocular muscles intact.  HEENT: Head atraumatic, normocephalic. Oropharynx and nasopharynx clear.  NECK:  Supple, no jugular venous distention. No thyroid enlargement, no tenderness.  LUNGS: Normal breath sounds bilaterally, no wheezing, rales,rhonchi or crepitation. No use of accessory muscles of respiration.  CARDIOVASCULAR: Regular rate and rhythm, S1, S2 normal. No murmurs, rubs, or gallops.  ABDOMEN: Soft, nondistended, nontender. Bowel sounds present.  No organomegaly or mass.  EXTREMITIES: No pedal edema, cyanosis, or clubbing.  NEUROLOGIC: Cranial nerves II through XII are intact. Muscle strength 5/5 in all extremities. Sensation intact. Gait not checked.  PSYCHIATRIC: The patient is alert and oriented x 3.  Normal affect and good eye contact. SKIN: No obvious rash, lesion, or ulcer.   LABORATORY PANEL:   CBC Recent Labs  Lab 08/07/19 1851  WBC 8.8  HGB 15.1*  HCT 43.8  PLT 225   ------------------------------------------------------------------------------------------------------------------  Chemistries  Recent Labs  Lab 08/07/19 1851  NA 127*  K 4.2  CL 90*  CO2 28  GLUCOSE 112*  BUN 18  CREATININE 0.85  CALCIUM 9.8  AST 34  ALT 28  ALKPHOS 48  BILITOT 0.9   ------------------------------------------------------------------------------------------------------------------  Cardiac Enzymes No results for input(s): TROPONINI in the last 168 hours. ------------------------------------------------------------------------------------------------------------------  RADIOLOGY:  CT Head Wo Contrast  Result Date: 08/07/2019 CLINICAL DATA:  85 year old female with history of altered mental status. EXAM: CT HEAD WITHOUT CONTRAST TECHNIQUE: Contiguous axial images were obtained from the base of the skull through the vertex without intravenous contrast. COMPARISON:  Head CT 12/28/2018. FINDINGS: Brain: Patchy and confluent areas of decreased attenuation are noted throughout the deep and periventricular white matter of the cerebral hemispheres bilaterally, compatible with chronic microvascular ischemic disease. No evidence of acute infarction, hemorrhage, hydrocephalus, extra-axial collection or mass lesion/mass effect. Vascular: No hyperdense vessel or unexpected calcification. Skull: Normal. Negative for fracture or focal lesion. Sinuses/Orbits: No acute finding. Other: None. IMPRESSION: 1. No acute intracranial abnormalities.  2. Chronic microvascular ischemic changes in the cerebral white matter, as above. Electronically Signed   By: Vinnie Langton M.D.   On: 08/07/2019 19:42      IMPRESSION AND PLAN:   1.  Acute encephalopathy, likely multifactorial secondary to hypertensive encephalopathy as well as hyponatremia. -The patient will be admitted to a medical monitored observation bed. -We will continue hydration with IV normal saline and restrict fluids to 1200 mL p.o. daily. -We will aggressively manage hypertension. The her antihypertensives will be continued while making her Catapres 0.1 mg p.o. 3 times daily to avoid rebound phenomenon, continue Cardizem CD, Cozaar, Toprol-XL and place the patient on as needed IV labetalol and hydralazine. -Will obtain hyponatremia work-up.  Will follow sodium level.  2.  COPD.  No current exacerbation.  We will continue her albuterol.  3.  Dyslipidemia.  Continue statin therapy.  4.  Anxiety/depression.  We will continue her BuSpar and Xanax as well as Effexor XR.  Trazodone  5.  DVT prophylaxis.  Subcutaneous Lovenox.   All the records are reviewed and case discussed with ED provider. The plan of care was discussed in details with the patient (and family). I answered all questions. The patient agreed to proceed with the above mentioned plan. Further management will depend upon hospital course.   CODE STATUS: This was discussed with the patient and she desires to be full code  TOTAL TIME TAKING CARE OF THIS PATIENT: 55 minutes.    Christel Mormon M.D on 08/07/2019 at 8:51 PM  [facility]   From 7 PM-7 AM, contact night-coverage [email or web address]  CC: Primary care physician; [name], MD   Note: This dictation was prepared with Dragon dictation along with smaller phrase technology. Any transcriptional errors that result from this process are unintentional.

## 2019-08-07 NOTE — ED Notes (Signed)
Pt returned from CT at this time. Pt placed back on monitor by this RN. Pt resting in bed with eyes closed. Visualized in NAD at this time.

## 2019-08-07 NOTE — ED Notes (Signed)
Pt placed on 2L at this time, o2 at 88% while asleep. Pt states that she sometimes has to wear o2 at night,

## 2019-08-07 NOTE — ED Notes (Signed)
Patient transported to CT 

## 2019-08-08 ENCOUNTER — Other Ambulatory Visit: Payer: Self-pay

## 2019-08-08 DIAGNOSIS — E871 Hypo-osmolality and hyponatremia: Secondary | ICD-10-CM | POA: Diagnosis not present

## 2019-08-08 DIAGNOSIS — G9340 Encephalopathy, unspecified: Secondary | ICD-10-CM | POA: Diagnosis not present

## 2019-08-08 LAB — BASIC METABOLIC PANEL
Anion gap: 11 (ref 5–15)
BUN: 13 mg/dL (ref 8–23)
CO2: 26 mmol/L (ref 22–32)
Calcium: 8.8 mg/dL — ABNORMAL LOW (ref 8.9–10.3)
Chloride: 93 mmol/L — ABNORMAL LOW (ref 98–111)
Creatinine, Ser: 0.76 mg/dL (ref 0.44–1.00)
GFR calc Af Amer: >60 mL/min (ref >60)
GFR calc non Af Amer: >60 mL/min (ref >60)
Glucose, Bld: 113 mg/dL — ABNORMAL HIGH (ref 70–99)
Potassium: 4.4 mmol/L (ref 3.5–5.1)
Sodium: 130 mmol/L — ABNORMAL LOW (ref 135–145)

## 2019-08-08 LAB — CBC
HCT: 39.1 % (ref 36.0–46.0)
Hemoglobin: 13.6 g/dL (ref 12.0–15.0)
MCH: 32.1 pg (ref 26.0–34.0)
MCHC: 34.8 g/dL (ref 30.0–36.0)
MCV: 92.2 fL (ref 80.0–100.0)
Platelets: 202 10*3/uL (ref 150–400)
RBC: 4.24 MIL/uL (ref 3.87–5.11)
RDW: 15.1 % (ref 11.5–15.5)
WBC: 9.9 10*3/uL (ref 4.0–10.5)
nRBC: 0 % (ref 0.0–0.2)

## 2019-08-08 LAB — MAGNESIUM: Magnesium: 1.6 mg/dL — ABNORMAL LOW (ref 1.7–2.4)

## 2019-08-08 LAB — SARS CORONAVIRUS 2 (TAT 6-24 HRS): SARS Coronavirus 2: NEGATIVE

## 2019-08-08 LAB — TROPONIN I (HIGH SENSITIVITY): Troponin I (High Sensitivity): 19 ng/L — ABNORMAL HIGH (ref <18)

## 2019-08-08 MED ORDER — SODIUM CHLORIDE 0.9 % IV SOLN
INTRAVENOUS | Status: DC | PRN
  Administered 2019-08-08: 20 mL via INTRAVENOUS

## 2019-08-08 MED ORDER — CLONIDINE HCL 0.1 MG PO TABS: 0.1000 mg | ORAL_TABLET | Freq: Three times a day (TID) | ORAL | 1 refills | Status: AC

## 2019-08-08 MED ORDER — MAGNESIUM SULFATE 2 GM/50ML IV SOLN
2.0000 g | Freq: Once | INTRAVENOUS | Status: AC
  Administered 2019-08-08: 2 g via INTRAVENOUS
  Filled 2019-08-08: qty 50

## 2019-08-08 MED ORDER — DILTIAZEM HCL ER COATED BEADS 180 MG PO CP24: 180.0000 mg | ORAL_CAPSULE | Freq: Every day | ORAL | 1 refills | Status: DC

## 2019-08-08 NOTE — Progress Notes (Signed)
Repeat EKG done, Dr. Arbutus Ped aware.  Clarise Cruz, BSN

## 2019-08-08 NOTE — Evaluation (Signed)
Physical Therapy Evaluation Patient Details Name: Janice Perkins MRN: [id number] DOB: Jul 12, 1933 Today's Date: 08/08/2019   History of Present Illness  Patient is 85 yo female with a known history of asthma, atrial fibrillation, breast cancer, COPD and hypertension, who presented to the ED with acute onset of altered mental status. Has had recent hospital admissions.    Clinical Impression  Patient alert, in bed, reported that she was feeling better than when she was admitted, A&Ox4, denied pain. The patient reported living with family who can assist 24/7 if needed, ambulates occasionally with rollator, usually without AD. The patient reported no falls in the last 6 months.  The patient demonstrated bed mobility mod I and sit <> Stand transfer with RW and supervision. The patient ambulated ~50ft with RW and modI/supervision. Patient exhibited decreased gait velocity and mild gait abnormalities but no LOB noted. Patient up in chair at end of session, all needs in reach.  Overall the patient demonstrated  Mild deficits (see "PT Problem List") compared from PLOF would benefit from skilled PT intervention.      Follow Up Recommendations Home health PT;Supervision - Intermittent    Equipment Recommendations  None recommended by PT    Recommendations for Other Services       Precautions / Restrictions Precautions Precautions: Fall Restrictions Weight Bearing Restrictions: No      Mobility  Bed Mobility Overal bed mobility: Modified Independent                Transfers Overall transfer level: Needs assistance Equipment used: Rolling walker (2 wheeled) Transfers: Sit to/from Stand Sit to Stand: Supervision            Ambulation/Gait Ambulation/Gait assistance: Min guard;Supervision Gait Distance (Feet): 60 Feet Assistive device: Rolling walker (2 wheeled)   Gait velocity: decreased   General Gait Details: Patient exhibited decreased gait velocity and mild gait  abnormalities but no LOB noted.  Stairs            Wheelchair Mobility    Modified Rankin (Stroke Patients Only)       Balance Overall balance assessment: Mild deficits observed, not formally tested                                           Pertinent Vitals/Pain Pain Assessment: No/denies pain    Home Living Family/patient expects to be discharged to:: Private residence Living Arrangements: Spouse/significant other;Children Available Help at Discharge: Family Type of Home: House Home Access: Level entry     Home Layout: One level Home Equipment: Walker - standard;Toilet riser;Walker - 4 wheels Additional Comments: 1 small step inside the house, back side, daughter and son live with patient and her husband    Prior Function           Comments: pt has been able to do her housework and self care     Hand Dominance   Dominant Hand: Right    Extremity/Trunk Assessment   Upper Extremity Assessment Upper Extremity Assessment: Generalized weakness    Lower Extremity Assessment Lower Extremity Assessment: Generalized weakness    Cervical / Trunk Assessment Cervical / Trunk Assessment: Kyphotic  Communication   Communication: HOH  Cognition Arousal/Alertness: Awake/alert Behavior During Therapy: WFL for tasks assessed/performed Overall Cognitive Status: Within Functional Limits for tasks assessed  General Comments      Exercises     Assessment/Plan    PT Assessment Patient needs continued PT services  PT Problem List Decreased strength;Decreased mobility;Decreased activity tolerance;Decreased balance       PT Treatment Interventions DME instruction;Therapeutic exercise;Gait training;Balance training;Stair training;Neuromuscular re-education;Functional mobility training;Therapeutic activities;Patient/family education    PT Goals (Current goals can be found in the Care Plan  section)  Acute Rehab PT Goals Patient Stated Goal: to go home PT Goal Formulation: With patient Time For Goal Achievement: 08/22/19 Potential to Achieve Goals: Good    Frequency Min 2X/week   Barriers to discharge        Co-evaluation               AM-PAC PT "6 Clicks" Mobility  Outcome Measure Help needed turning from your back to your side while in a flat bed without using bedrails?: None Help needed moving from lying on your back to sitting on the side of a flat bed without using bedrails?: None Help needed moving to and from a bed to a chair (including a wheelchair)?: None Help needed standing up from a chair using your arms (e.g., wheelchair or bedside chair)?: A Little Help needed to walk in hospital room?: A Little Help needed climbing 3-5 steps with a railing? : A Little 6 Click Score: 21    End of Session Equipment Utilized During Treatment: Gait belt Activity Tolerance: Patient tolerated treatment well Patient left: with chair alarm set;in chair;with call bell/phone within reach Nurse Communication: Mobility status PT Visit Diagnosis: Other abnormalities of gait and mobility (R26.89)    Time: [id number] PT Time Calculation (min) (ACUTE ONLY): 24 min   Charges:   PT Evaluation $PT Eval Low Complexity: 1 Low PT Treatments $Therapeutic Exercise: 8-22 mins        Lieutenant Diego PT, DPT 3:54 PM,08/08/19

## 2019-08-08 NOTE — Progress Notes (Signed)
Report called to [facility] for patient going to room 136.

## 2019-08-08 NOTE — ED Notes (Signed)
purwick placement checked and verified that it is working at this time

## 2019-08-08 NOTE — ED Notes (Signed)
Pt asleep in bed, resting NAD.

## 2019-08-08 NOTE — Progress Notes (Signed)
Made Dr. Arbutus Ped aware that patient had 10 beat run of wide QRS.  No new orders. Clarise Cruz, BSN

## 2019-08-08 NOTE — TOC Transition Note (Signed)
Transition of Care [facility]) - CM/SW Discharge Note   Patient Details  Name: Janice Perkins MRN: [id number] Date of Birth: Dec 23, 1932  Transition of Care [facility]) CM/SW Contact:  Su Hilt, RN Phone Number: 08/08/2019, 3:31 PM   Clinical Narrative:    Met with the patient and her spouse to discuss DC plan and needs, she has a RW and a Rolator at home and does not need additional DME, she has used [facility] previously for [facility] and would like to use them again. I notified Corene Cornea. She lives at home with her spouse and her daughter and transportation is provided by them, She is up to date with her PCP No additional needs    Final next level of care: Home w Home Health Services Barriers to Discharge: Barriers Resolved   Patient Goals and CMS Choice Patient states their goals for this hospitalization and ongoing recovery are:: go home CMS Medicare.gov Compare Post Acute Care list provided to:: Patient Choice offered to / list presented to : Patient  Discharge Placement                       Discharge Plan and Services   Discharge Planning Services: CM Consult Post Acute Care Choice: Home Health          DME Arranged: Brace, Back, N/A         HH Arranged: PT, OT [place] Agency: [place] ([place]) Date [place]: 08/08/19 Time [place]: [place] Representative spoke with at [place]: [place] ([place]) Interventions     Readmission Risk Interventions Readmission Risk Prevention Plan 06/10/2019 06/05/2019 06/01/2019  Transportation Screening Complete Complete Complete  Medication Review Press photographer) Complete Referral to Pharmacy Complete  PCP or Specialist appointment within 3-5 days of discharge - (No Data) Complete  PCP/Specialist Appt Not Complete comments - - -  HRI or Home Care Consult Complete Complete -  SW Recovery Care/Counseling Consult Patient refused Not Complete -  SW Consult Not Complete Comments - NA  -  Palliative Care Screening Not Applicable Not Applicable Not [place] Not Applicable Not Applicable Not Applicable  Some recent data might be hidden

## 2019-08-08 NOTE — Discharge Summary (Signed)
Physician Discharge Summary  Janice Perkins [id number] DOB: 01/01/1933 DOA: 08/07/2019  PCP: [name], MD  Admit date: 08/07/2019 Discharge date: 08/08/2019  Admitted From: Home Disposition:  Home  Recommendations for Outpatient Follow-up:  1. Follow up with PCP in 1-2 weeks 2. Please obtain BMP/CBC in one week 3. Please follow up with cardiology   Home Health: Yes Equipment/Devices: None  Discharge Condition: Stable CODE STATUS: Full  Diet recommendation: Heart Healthy, 1500cc/day fluid restriction.  Brief/Interim Summary:  85 y.o. Caucasian female with a known history of asthma, atrial fibrillation, COPD and hypertension, who presented to the emergency room with acute onset of altered mental status with confusion that started that afternoon, with associated headache and mild dizziness with mild palpitations.  In the ED, blood pressure was 201/106 with otherwise normal vital signs.  Labs were remarkable for hyponatremia with sodium of 127 and hypochloremia with chloride of 90.  BNP was 177 and troponin I 11 L 18.  CBC showed mild hemoconcentration.  Urinalysis showed specific gravity 1006 and 30 protein.  Head CT scan without contrast revealed no acute intracranial normalities.  It did show chronic microvascular ischemic changes in the cerebral white matter.  Portable chest x-ray showed pulmonary vascular congestion and left shoulder prosthesis.  EKG showed with a rate of 101 with widened QRS complexes patient's pulse symmetry is dropped to 88% while sleeping was placed on 2 L of O2 nightly.  She received 5 mg of IV Lopressor and 500 mill of IV normal saline bolus in the ER.  He will be admitted to an observation medical monitored bed for further evaluation and management.  Patient's mental status improved with blood pressure control.  Hyponatremia appears chronic, baseline upper 120's to low 130's, suspect SIADH.  Recommended patient resume fluid restriction, as she had reported  being on in the past.  She reported she had recently been more liberal with fluid intake and eating more salt because her sodium is low.  Discussed with patient sodium elevating blood pressure and causing fluid retention.  She verbalized understanding.  Patient clinically improved, hyponatremia improved and at baseline, and stable for discharge with close follow up   Discharge Diagnoses: Active Problems:   Acute encephalopathy    Discharge Instructions     * albuterol 108 (90 Base) MCG/ACT inhaler Commonly known as: VENTOLIN HFA Inhale 2 puffs into the lungs every 6 (six) hours as needed for wheezing or shortness of breath.          * albuterol 1.25 MG/3ML nebulizer solution Commonly known as: ACCUNEB Inhale 3 mLs into the lungs every 6 (six) hours as needed for wheezing.          ALPRAZolam 0.5 MG tablet Commonly known as: XANAX Take 0.5 mg by mouth 2 (two) times a day.  Tonight 08/08/2019        alum & mag hydroxide-simeth 200-200-20 MG/5ML suspension Commonly known as: MAALOX/MYLANTA Take 30 mLs by mouth every 4 (four) hours as needed for indigestion or heartburn.          atorvastatin 40 MG tablet Commonly known as: LIPITOR Take 40 mg by mouth daily.  Tomorrow 08/09/2019        busPIRone 10 MG tablet Commonly known as: BUSPAR Take 10 mg by mouth 2 (two) times daily.  Tonight 08/08/2019        cholecalciferol 1000 units tablet Commonly known as: VITAMIN D Take 1,000 Units by mouth daily.  Tomorrow 08/09/2019  cloNIDine 0.1 MG tablet Commonly known as: CATAPRES Take 1 tablet (0.1 mg total) by mouth 3 (three) times daily. What changed:  0 when to take this 0 reasons to take this  Tonight 08/08/2019        diltiazem 180 MG 24 hr capsule Commonly known as: CARDIZEM CD Take 1 capsule (180 mg total) by mouth daily. What changed: Another medication with the same name was removed. Continue taking this medication, and follow the directions you see here.  Tomorrow 08/09/2019         famotidine 20 MG tablet Commonly known as: PEPCID Take 20 mg by mouth 2 (two) times daily.  Tonight 08/08/2019        ferrous sulfate 325 (65 FE) MG tablet Take 325 mg by mouth daily with breakfast.  Tomorrow 08/09/2019        Fluticasone-Salmeterol 250-50 MCG/DOSE Aepb Commonly known as: ADVAIR Inhale 1 puff into the lungs 2 (two) times daily.  Tonight 08/08/2019        furosemide 20 MG tablet Commonly known as: Lasix Take 1 tablet (20 mg total) by mouth daily.  Tomorrow 08/09/2019        gabapentin 300 MG capsule Commonly known as: NEURONTIN Take 300 mg by mouth daily.  Tomorrow 08/09/2019        hydrALAZINE 50 MG tablet Commonly known as: APRESOLINE Take 50 mg by mouth 3 (three) times daily.  Tonight 08/08/2019        HYDROcodone-acetaminophen 5-325 MG tablet Commonly known as: NORCO/VICODIN Take 1 tablet by mouth 2 (two) times daily.  Tonight 08/08/2019        levothyroxine 50 MCG tablet Commonly known as: SYNTHROID Take 50 mcg by mouth daily before breakfast.  Tomorrow 08/09/2019        loratadine 10 MG tablet Commonly known as: CLARITIN Take 10 mg by mouth daily.  Tomorrow 08/09/2019        losartan 50 MG tablet Commonly known as: COZAAR Take 2 tablets (100 mg total) by mouth at bedtime.  Tonight 08/08/2019        magnesium oxide 400 MG tablet Commonly known as: MAG-OX Take 400 mg by mouth 2 (two) times daily.  Tonight 08/08/2019        meclizine 25 MG tablet Commonly known as: ANTIVERT Take 1 tablet (25 mg total) by mouth 2 (two) times daily as needed for dizziness.          meloxicam 15 MG tablet Commonly known as: MOBIC Take 15 mg by mouth daily.  Tomorrow 08/09/2019        metoprolol succinate 25 MG 24 hr tablet Commonly known as: TOPROL-XL Take 1 tablet (25 mg total) by mouth daily.  Tomorrow 08/09/2019        mometasone-formoterol 100-5 MCG/ACT Aero Commonly known as: DULERA Inhale 2 puffs into the lungs 2 (two) times daily.  Tonight 08/08/2019         polyethylene glycol 17 g packet Commonly known as: MIRALAX / GLYCOLAX Take 17 g by mouth daily.  Tomorrow 08/09/2019        RABEprazole 20 MG tablet Commonly known as: ACIPHEX Take 20 mg by mouth 2 (two) times daily.  Tonight 08/08/2019        venlafaxine XR 75 MG 24 hr capsule Commonly known as: EFFEXOR-XR Take 75 mg by mouth daily.  Tomorrow 08/09/2019       (very important)  * This list has 2 medication(s) that are the same as other medications prescribed for  Allergies  Allergen Reactions  . Diphenhydramine Hcl Rash  . Penicillins Hives    .Has patient had a PCN reaction causing immediate rash, facial/tongue/throat swelling, SOB or lightheadedness with hypotension: Unknown Has patient had a PCN reaction causing severe rash involving mucus membranes or skin necrosis: Unknown Has patient had a PCN reaction that required hospitalization: Unknown Has patient had a PCN reaction occurring within the last 10 years: Unknown If all of the above answers are "NO", then may proceed with Cephalosporin use.   . Captopril Other (See Comments)  . Eliquis [Apixaban] Other (See Comments)    Stomach bleed  . Enalapril Maleate Other (See Comments)    Other reaction(s): Unknown  . Tape Itching  . Terfenadine Other (See Comments)  . Augmentin [Amoxicillin-Pot Clavulanate] Rash    Has patient had a PCN reaction causing immediate rash, facial/tongue/throat swelling, SOB or lightheadedness with hypotension: Unknown Has patient had a PCN reaction causing severe rash involving mucus membranes or skin necrosis: Unknown Has patient had a PCN reaction that required hospitalization: Unknown Has patient had a PCN reaction occurring within the last 10 years: Unknown If all of the above answers are "NO", then may proceed with Cephalosporin use.  . Biaxin [Clarithromycin] Rash, Other (See Comments) and Hives    Other reaction(s): Unknown    Consultations:  None   Procedures/Studies: CT Head Wo  Contrast  Result Date: 08/07/2019 CLINICAL DATA:  85 year old female with history of altered mental status. EXAM: CT HEAD WITHOUT CONTRAST TECHNIQUE: Contiguous axial images were obtained from the base of the skull through the vertex without intravenous contrast. COMPARISON:  Head CT 12/28/2018. FINDINGS: Brain: Patchy and confluent areas of decreased attenuation are noted throughout the deep and periventricular white matter of the cerebral hemispheres bilaterally, compatible with chronic microvascular ischemic disease. No evidence of acute infarction, hemorrhage, hydrocephalus, extra-axial collection or mass lesion/mass effect. Vascular: No hyperdense vessel or unexpected calcification. Skull: Normal. Negative for fracture or focal lesion. Sinuses/Orbits: No acute finding. Other: None. IMPRESSION: 1. No acute intracranial abnormalities. 2. Chronic microvascular ischemic changes in the cerebral white matter, as above. Electronically Signed   By: Vinnie Langton M.D.   On: 08/07/2019 19:42   DG Chest Portable 1 View  Result Date: 08/07/2019 CLINICAL DATA:  Altered mental status EXAM: PORTABLE CHEST 1 VIEW COMPARISON:  None. FINDINGS: There are bibasilar interstitial opacities, right greater than left. No focal consolidation. No pleural effusion or pneumothorax. Mild cardiomegaly. IMPRESSION: Mild cardiomegaly with mild interstitial pulmonary edema. Electronically Signed   By: Ulyses Jarred M.D.   On: 08/07/2019 19:45        Subjective: Patient doing well today.  Reports no dizziness or headache.  Says she had previously been told to limit fluids to two 20 oz bottles of water per day, but had been drinking more recently.  Had also been told by someone to eat more salt, since her sodium runs low.  Discussed impact of sodium on fluid retention and increased blood pressure.  She denies chest pain, fever chill or other complaints.   Discharge Exam: Vitals:   08/08/19 0707 08/08/19 0808  BP: (!)  148/91 (!) 146/82  Pulse: 73 81  Resp: 16   Temp: 98.9 F (37.2 C) 98.6 F (37 C)  SpO2: 98% 100%   Vitals:   08/08/19 0230 08/08/19 0323 08/08/19 0707 08/08/19 0808  BP: (!) 142/74 (!) 183/87 (!) 148/91 (!) 146/82  Pulse: 69 86 73 81  Resp: 17 14 16    Temp:  98.7 F (37.1 C) 98.9 F (37.2 C) 98.6 F (37 C)  TempSrc:  Oral Oral Oral  SpO2: 98% 100% 98% 100%  Weight:      Height:        General: Pt is alert, awake, not in acute distress Cardiovascular: RRR, S1/S2 +, no rubs, no gallops Respiratory: CTA bilaterally, no wheezing, no rhonchi Abdominal: Soft, NT, ND, bowel sounds + Extremities: lower extremity trace edema, no cyanosis    The results of significant diagnostics from this hospitalization (including imaging, microbiology, ancillary and laboratory) are listed below for reference.     Microbiology: Recent Results (from the past 240 hour(s))  SARS CORONAVIRUS 2 (TAT 6-24 HRS) Nasopharyngeal Nasopharyngeal Swab     Status: None   Collection Time: 08/07/19  8:31 PM   Specimen: Nasopharyngeal Swab  Result Value Ref Range Status   SARS Coronavirus 2 NEGATIVE NEGATIVE Final    Comment: (NOTE) SARS-CoV-2 target nucleic acids are NOT DETECTED. The SARS-CoV-2 RNA is generally detectable in upper and lower respiratory specimens during the acute phase of infection. Negative results do not preclude SARS-CoV-2 infection, do not rule out co-infections with other pathogens, and should not be used as the sole basis for treatment or other patient management decisions. Negative results must be combined with clinical observations, patient history, and epidemiological information. The expected result is Negative. Fact Sheet for Patients: SugarRoll.be Fact Sheet for Healthcare Providers: https://www.woods-mathews.com/ This test is not yet approved or cleared by the [place] FDA and  has been authorized for detection and/or  diagnosis of SARS-CoV-2 by FDA under an Emergency Use Authorization (EUA). This EUA will remain  in effect (meaning this test can be used) for the duration of the COVID-19 declaration under Section 56 4(b)(1) of the Act, 21 U.S.C. section 360bbb-3(b)(1), unless the authorization is terminated or revoked sooner. Performed at [facility], [place] [address], [place], [place] [address]      Labs: BNP (last 3 results) Recent Labs    12/23/18 1915 06/09/19 1017 08/07/19 1851  BNP 155.0* 610.0* [id number]*   Basic Metabolic Panel: Recent Labs  Lab 08/07/19 1851 08/08/19 0600  NA 127* 130*  K 4.2 4.4  CL 90* 93*  CO2 28 26  GLUCOSE 112* 113*  BUN 18 13  CREATININE 0.85 0.76  CALCIUM 9.8 8.8*   Liver Function Tests: Recent Labs  Lab 08/07/19 1851  AST 34  ALT 28  ALKPHOS 48  BILITOT 0.9  PROT 8.0  ALBUMIN 4.6   No results for input(s): LIPASE, AMYLASE in the last 168 hours. No results for input(s): AMMONIA in the last 168 hours. CBC: Recent Labs  Lab 08/07/19 1851 08/08/19 0600  WBC 8.8 9.9  NEUTROABS 4.7  --   HGB 15.1* 13.6  HCT 43.8 39.1  MCV 91.4 92.2  PLT 225 202   Cardiac Enzymes: No results for input(s): CKTOTAL, CKMB, CKMBINDEX, TROPONINI in the last 168 hours. BNP: Invalid input(s): POCBNP CBG: No results for input(s): GLUCAP in the last 168 hours. D-Dimer No results for input(s): DDIMER in the last 72 hours. Hgb A1c No results for input(s): HGBA1C in the last 72 hours. Lipid Profile No results for input(s): CHOL, HDL, LDLCALC, TRIG, CHOLHDL, LDLDIRECT in the last 72 hours. Thyroid function studies Recent Labs    08/07/19 2031  TSH 3.481   Anemia work up No results for input(s): VITAMINB12, FOLATE, FERRITIN, TIBC, IRON, RETICCTPCT in the last 72 hours. Urinalysis    Component Value Date/Time  COLORURINE STRAW (A) 08/07/2019 1851   APPEARANCEUR CLEAR (A) 08/07/2019 1851   LABSPEC 1.006 08/07/2019 1851   PHURINE 7.0 08/07/2019 1851    GLUCOSEU NEGATIVE 08/07/2019 1851   HGBUR NEGATIVE 08/07/2019 1851   BILIRUBINUR NEGATIVE 08/07/2019 1851   [place] 08/07/2019 1851   PROTEINUR 30 (A) 08/07/2019 1851   NITRITE NEGATIVE 08/07/2019 1851   LEUKOCYTESUR NEGATIVE 08/07/2019 1851   Sepsis Labs Invalid input(s): PROCALCITONIN,  WBC,  LACTICIDVEN Microbiology Recent Results (from the past 240 hour(s))  SARS CORONAVIRUS 2 (TAT 6-24 HRS) Nasopharyngeal Nasopharyngeal Swab     Status: None   Collection Time: 08/07/19  8:31 PM   Specimen: Nasopharyngeal Swab  Result Value Ref Range Status   SARS Coronavirus 2 NEGATIVE NEGATIVE Final    Comment: (NOTE) SARS-CoV-2 target nucleic acids are NOT DETECTED. The SARS-CoV-2 RNA is generally detectable in upper and lower respiratory specimens during the acute phase of infection. Negative results do not preclude SARS-CoV-2 infection, do not rule out co-infections with other pathogens, and should not be used as the sole basis for treatment or other patient management decisions. Negative results must be combined with clinical observations, patient history, and epidemiological information. The expected result is Negative. Fact Sheet for Patients: SugarRoll.be Fact Sheet for Healthcare Providers: https://www.woods-mathews.com/ This test is not yet approved or cleared by the [place] FDA and  has been authorized for detection and/or diagnosis of SARS-CoV-2 by FDA under an Emergency Use Authorization (EUA). This EUA will remain  in effect (meaning this test can be used) for the duration of the COVID-19 declaration under Section 56 4(b)(1) of the Act, 21 U.S.C. section 360bbb-3(b)(1), unless the authorization is terminated or revoked sooner. Performed at [facility], [place] [address] [place], [place] [address]      Time coordinating discharge: Over 30 minutes  SIGNED:   Ezekiel Slocumb, DO [facility] 08/08/2019, 11:32 AM   If 7PM-7AM, please contact night-coverage [email or web address] Password [email or web address]

## 2019-08-08 NOTE — Plan of Care (Signed)
  Problem: Nutrition: Goal: Adequate nutrition will be maintained Outcome: Completed/Met

## 2019-08-08 NOTE — ED Notes (Signed)
Pts bottom dentures placed in denture cup and placed at bedside.

## 2019-08-25 ENCOUNTER — Other Ambulatory Visit: Payer: Self-pay

## 2019-08-25 ENCOUNTER — Emergency Department
Admission: EM | Admit: 2019-08-25 | Discharge: 2019-08-25 | Disposition: A | Discharge status: Home / Self Care | Payer: Medicare Other | Attending: Emergency Medicine | Admitting: Emergency Medicine

## 2019-08-25 ENCOUNTER — Emergency Department: Payer: Medicare Other

## 2019-08-25 ENCOUNTER — Encounter: Payer: Self-pay | Admitting: Emergency Medicine

## 2019-08-25 DIAGNOSIS — Z79899 Other long term (current) drug therapy: Secondary | ICD-10-CM | POA: Diagnosis not present

## 2019-08-25 DIAGNOSIS — J449 Chronic obstructive pulmonary disease, unspecified: Secondary | ICD-10-CM | POA: Diagnosis not present

## 2019-08-25 DIAGNOSIS — R053 Chronic cough: Secondary | ICD-10-CM

## 2019-08-25 DIAGNOSIS — H8110 Benign paroxysmal vertigo, unspecified ear: Secondary | ICD-10-CM | POA: Diagnosis not present

## 2019-08-25 DIAGNOSIS — E871 Hypo-osmolality and hyponatremia: Secondary | ICD-10-CM | POA: Diagnosis not present

## 2019-08-25 DIAGNOSIS — I10 Essential (primary) hypertension: Secondary | ICD-10-CM | POA: Insufficient documentation

## 2019-08-25 DIAGNOSIS — R05 Cough: Secondary | ICD-10-CM | POA: Diagnosis not present

## 2019-08-25 DIAGNOSIS — R799 Abnormal finding of blood chemistry, unspecified: Secondary | ICD-10-CM | POA: Diagnosis present

## 2019-08-25 DIAGNOSIS — Z853 Personal history of malignant neoplasm of breast: Secondary | ICD-10-CM | POA: Insufficient documentation

## 2019-08-25 LAB — COMPREHENSIVE METABOLIC PANEL
ALT: 21 U/L (ref 0–44)
AST: 24 U/L (ref 15–41)
Albumin: 3.9 g/dL (ref 3.5–5.0)
Alkaline Phosphatase: 44 U/L (ref 38–126)
Anion gap: 8 (ref 5–15)
BUN: 15 mg/dL (ref 8–23)
CO2: 30 mmol/L (ref 22–32)
Calcium: 9.6 mg/dL (ref 8.9–10.3)
Chloride: 97 mmol/L — ABNORMAL LOW (ref 98–111)
Creatinine, Ser: 0.89 mg/dL (ref 0.44–1.00)
GFR calc Af Amer: >60 mL/min (ref >60)
GFR calc non Af Amer: 60 mL/min — ABNORMAL LOW (ref >60)
Glucose, Bld: 118 mg/dL — ABNORMAL HIGH (ref 70–99)
Potassium: 4 mmol/L (ref 3.5–5.1)
Sodium: 135 mmol/L (ref 135–145)
Total Bilirubin: 0.8 mg/dL (ref 0.3–1.2)
Total Protein: 7.4 g/dL (ref 6.5–8.1)

## 2019-08-25 LAB — CBC
HCT: 42.4 % (ref 36.0–46.0)
Hemoglobin: 14.2 g/dL (ref 12.0–15.0)
MCH: 31.8 pg (ref 26.0–34.0)
MCHC: 33.5 g/dL (ref 30.0–36.0)
MCV: 94.9 fL (ref 80.0–100.0)
Platelets: 227 10*3/uL (ref 150–400)
RBC: 4.47 MIL/uL (ref 3.87–5.11)
RDW: 14.9 % (ref 11.5–15.5)
WBC: 7.8 10*3/uL (ref 4.0–10.5)
nRBC: 0 % (ref 0.0–0.2)

## 2019-08-25 LAB — TROPONIN I (HIGH SENSITIVITY): Troponin I (High Sensitivity): 11 ng/L (ref <18)

## 2019-08-25 MED ORDER — SODIUM CHLORIDE 0.9% FLUSH: 3.0000 mL | Freq: Once | INTRAVENOUS | Status: DC

## 2019-08-25 MED ORDER — BENZONATATE 100 MG PO CAPS: 100.0000 mg | ORAL_CAPSULE | Freq: Three times a day (TID) | ORAL | 0 refills | Status: DC | PRN

## 2019-08-25 NOTE — ED Provider Notes (Signed)
[facility] Emergency Department Provider Note   ____________________________________________   First MD Initiated Contact with Patient 08/25/19 [phone]     (approximate)  I have reviewed the triage vital signs and the nursing notes.   HISTORY  Chief Complaint Abnormal Labs    HPI Janice Perkins is a 86 y.o. female with past medical history of atrial fibrillation, COPD, breast cancer, and vertigo presents to the ED complaining of abnormal labs.  Patient reports that she has dealt with hyponatremia for a long time and has been told that she needs to increase her salt intake and restrict fluid intake.  She is concerned because she was seen in the [facility] emergency room 2 days ago for dizziness, when she was again told her sodium was low.  She states she has continued to feel dizzy on occasion, but describes this as similar to prior episodes of vertigo, and dizziness recently has been alleviated by meclizine.  She denies any vision changes, numbness, or weakness.  She presents today because she would like her sodium rechecked.  She denies any fevers, cough, chest pain, shortness of breath, dysuria, or hematuria.  She does report falling onto her left shoulder about 1 week ago, but did not hit her head or lose consciousness.  She has had some mild pain in her left shoulder since the fall, but denies any limitation in her range of motion.        Past Medical History:  Diagnosis Date  . Asthma   . Atrial fibrillation ([place])   . Breast cancer ([place]) 2011  . Bronchitis 04/2015  . Cancer [facility]) 2011   breast  . COPD (chronic obstructive pulmonary disease) ([place])   . Hypertension   . Shingles    10/2015    Patient Active Problem List   Diagnosis Date Noted  . Acute encephalopathy 08/07/2019  . Fluid overload 06/09/2019  . Bronchitis 05/31/2019  . Hypothyroidism 05/31/2019  . Duodenum ulcer   . Melena   . GI bleed 02/17/2019  . Hypertensive encephalopathy  12/11/2018  . Hypoxia 05/30/2018  . Leukocytosis 04/05/2018  . Pneumonia 11/15/2017  . Pleural effusion 05/16/2017  . AF (paroxysmal atrial fibrillation) ([place]) 05/16/2017  . Breast cancer ([place]) 05/16/2017  . Dependence on nocturnal oxygen therapy 05/16/2017  . HTN (hypertension) 05/16/2017  . Generalized weakness 03/07/2017  . Hyponatremia 03/07/2017  . Dehydration 03/07/2017  . UTI (urinary tract infection) 03/07/2017  . HCAP (healthcare-associated pneumonia) 01/19/2017  . AKI (acute kidney injury) ([place]) 12/18/2016  . Primary cancer of upper outer quadrant of right female breast ([place]) 04/07/2016  . Lumbar radiculopathy 03/23/2016  . Spinal stenosis, lumbar region, with neurogenic claudication 03/23/2016  . DDD (degenerative disc disease), lumbar 01/14/2015  . Lumbosacral facet joint syndrome 01/14/2015  . Sacroiliac joint dysfunction 01/14/2015  . Greater trochanteric bursitis 01/14/2015  . Bilateral occipital neuralgia 01/14/2015    Past Surgical History:  Procedure Laterality Date  . BREAST EXCISIONAL BIOPSY Right 11/29/13   two areas FAT NECROSIS  . BREAST EXCISIONAL BIOPSY Right 10/30/2009   lumpectomy - radiation  . COLONOSCOPY WITH PROPOFOL N/A 06/10/2018   Procedure: COLONOSCOPY WITH PROPOFOL;  Surgeon: Manya Silvas, MD;  Location: [facility] ENDOSCOPY;  Service: Endoscopy;  Laterality: N/A;  . COLONOSCOPY WITH PROPOFOL N/A 02/20/2019   Procedure: COLONOSCOPY WITH PROPOFOL;  Surgeon: Lucilla Lame, MD;  Location: [facility] ENDOSCOPY;  Service: Endoscopy;  Laterality: N/A;  . ESOPHAGOGASTRODUODENOSCOPY (EGD) WITH PROPOFOL N/A 06/10/2018   Procedure: ESOPHAGOGASTRODUODENOSCOPY (EGD) WITH  PROPOFOL;  Surgeon: Manya Silvas, MD;  Location: [facility] ENDOSCOPY;  Service: Endoscopy;  Laterality: N/A;  . ESOPHAGOGASTRODUODENOSCOPY (EGD) WITH PROPOFOL N/A 02/20/2019   Procedure: ESOPHAGOGASTRODUODENOSCOPY (EGD) WITH PROPOFOL;  Surgeon: Lucilla Lame, MD;  Location: [facility] ENDOSCOPY;  Service:  Endoscopy;  Laterality: N/A;  . NASAL SINUS SURGERY      Prior to Admission medications   Medication Sig Start Date End Date Taking? Authorizing Provider  albuterol (ACCUNEB) 1.25 MG/3ML nebulizer solution Inhale 3 mLs into the lungs every 6 (six) hours as needed for wheezing.     [provider]  albuterol (PROVENTIL HFA;VENTOLIN HFA) 108 (90 Base) MCG/ACT inhaler Inhale 2 puffs into the lungs every 6 (six) hours as needed for wheezing or shortness of breath.     [provider]  ALPRAZolam Duanne Moron) 0.5 MG tablet Take 0.5 mg by mouth 2 (two) times a day.     [provider]  alum & mag hydroxide-simeth (MAALOX/MYLANTA) 200-200-20 MG/5ML suspension Take 30 mLs by mouth every 4 (four) hours as needed for indigestion or heartburn. 06/08/19   Demetrios Loll, MD  atorvastatin (LIPITOR) 40 MG tablet Take 40 mg by mouth daily. 03/17/19   [provider]  benzonatate (TESSALON PERLES) 100 MG capsule Take 1 capsule (100 mg total) by mouth 3 (three) times daily as needed for cough. 08/25/19 08/24/20  Blake Divine, MD  busPIRone (BUSPAR) 10 MG tablet Take 10 mg by mouth 2 (two) times daily.    [provider]  cholecalciferol (VITAMIN D) 1000 units tablet Take 1,000 Units by mouth daily.    [provider]  cloNIDine (CATAPRES) 0.1 MG tablet Take 1 tablet (0.1 mg total) by mouth 3 (three) times daily. 08/08/19   Ezekiel Slocumb, DO  diltiazem (CARDIZEM CD) 180 MG 24 hr capsule Take 1 capsule (180 mg total) by mouth daily. 08/08/19   Ezekiel Slocumb, DO  famotidine (PEPCID) 20 MG tablet Take 20 mg by mouth 2 (two) times daily. 07/29/19   [provider]  ferrous sulfate 325 (65 FE) MG tablet Take 325 mg by mouth daily with breakfast.    [provider]  Fluticasone-Salmeterol (ADVAIR) 250-50 MCG/DOSE AEPB Inhale 1 puff into the lungs 2 (two) times daily.    [provider]  furosemide (LASIX) 20 MG tablet Take 1 tablet (20 mg  total) by mouth daily. 06/10/19 06/09/20  Loletha Grayer, MD  gabapentin (NEURONTIN) 300 MG capsule Take 300 mg by mouth daily.    [provider]  hydrALAZINE (APRESOLINE) 50 MG tablet Take 50 mg by mouth 3 (three) times daily. 07/10/19   [provider]  HYDROcodone-acetaminophen (NORCO/VICODIN) 5-325 MG tablet Take 1 tablet by mouth 2 (two) times daily.  04/26/19   [provider]  levothyroxine (SYNTHROID, LEVOTHROID) 50 MCG tablet Take 50 mcg by mouth daily before breakfast.    [provider]  loratadine (CLARITIN) 10 MG tablet Take 10 mg by mouth daily.    [provider]  losartan (COZAAR) 50 MG tablet Take 2 tablets (100 mg total) by mouth at bedtime. 12/13/18   Loletha Grayer, MD  magnesium oxide (MAG-OX) 400 MG tablet Take 400 mg by mouth 2 (two) times daily.    [provider]  meloxicam (MOBIC) 15 MG tablet Take 15 mg by mouth daily. 02/07/19   [provider]  metoprolol succinate (TOPROL-XL) 25 MG 24 hr tablet Take 1 tablet (25 mg total) by mouth daily. 06/10/19   Loletha Grayer, MD  mometasone-formoterol (DULERA) 100-5 MCG/ACT AERO Inhale 2 puffs into the lungs 2 (two) times daily.    [provider]  polyethylene glycol (MIRALAX / GLYCOLAX) 17 g packet Take 17 g by mouth daily. 01/03/19   Earleen Newport, MD  RABEprazole (ACIPHEX) 20 MG tablet Take 20 mg by mouth 2 (two) times daily.    [provider]  venlafaxine XR (EFFEXOR-XR) 75 MG 24 hr capsule Take 75 mg by mouth daily.    [provider]    Allergies Diphenhydramine hcl, Penicillins, Captopril, Eliquis [apixaban], Enalapril maleate, Tape, Terfenadine, Augmentin [amoxicillin-pot clavulanate], and Biaxin [clarithromycin]  Family History  Problem Relation Age of Onset  . Hypertension Mother   . Heart disease Mother   . Cancer Mother   . Stroke Father   . Hypertension Father   . Breast cancer Sister     Social  History Social History   Tobacco Use  . Smoking status: Never Smoker  . Smokeless tobacco: Never Used  Substance Use Topics  . Alcohol use: No    Alcohol/week: 0.0 standard drinks  . Drug use: No    Review of Systems  Constitutional: No fever/chills Eyes: No visual changes. ENT: No sore throat. Cardiovascular: Denies chest pain. Respiratory: Denies shortness of breath. Gastrointestinal: No abdominal pain.  No nausea, no vomiting.  No diarrhea.  No constipation. Genitourinary: Negative for dysuria. Musculoskeletal: Negative for back pain. Skin: Negative for rash. Neurological: Negative for headaches, focal weakness or numbness.  Positive for dizziness.  ____________________________________________   PHYSICAL EXAM:  VITAL SIGNS: ED Triage Vitals  Enc Vitals Group     BP 08/25/19 0729 (!) 193/84     Pulse Rate 08/25/19 0729 73     Resp 08/25/19 0729 18     Temp 08/25/19 0729 98.3 F (36.8 C)     Temp Source 08/25/19 0729 Oral     SpO2 08/25/19 0729 97 %     Weight 08/25/19 0730 150 lb (68 kg)     Height 08/25/19 0730 5\' 1"  (1.549 m)     Head Circumference --      Peak Flow --      Pain Score 08/25/19 0729 5     Pain Loc --      Pain Edu? --      Excl. in [place]? --     Constitutional: Alert and oriented. Eyes: Conjunctivae are normal. Head: Atraumatic. Nose: No congestion/rhinnorhea. Mouth/Throat: Mucous membranes are moist. Neck: Normal ROM Cardiovascular: Normal rate, regular rhythm. Grossly normal heart sounds. Respiratory: Normal respiratory effort.  No retractions. Lungs CTAB. Gastrointestinal: Soft and nontender. No distention. Genitourinary: deferred Musculoskeletal: No lower extremity tenderness nor edema. Neurologic:  Normal speech and language. No gross focal neurologic deficits are appreciated. Skin:  Skin is warm, dry and intact. No rash noted. Psychiatric: Mood and affect are normal. Speech and behavior are  normal.  ____________________________________________   LABS (all labs ordered are listed, but only abnormal results are displayed)  Labs Reviewed  COMPREHENSIVE METABOLIC PANEL - Abnormal; Notable for the following components:      Result Value   Chloride 97 (*)    Glucose, Bld 118 (*)    GFR calc non Af Amer 60 (*)    All other components within normal limits  CBC  CBG MONITORING, ED  TROPONIN I (HIGH SENSITIVITY)   ____________________________________________  EKG  ED ECG REPORT I, Blake Divine, the attending physician, personally viewed and interpreted this ECG.   Date: 08/25/2019  EKG  Time: 7:42  Rate: 65  Rhythm: atrial fibrillation, rate 65  Axis: LAD  Intervals:none  ST&T Change: None   PROCEDURES  Procedure(s) performed (including Critical Care):  Procedures   ____________________________________________   INITIAL IMPRESSION / ASSESSMENT AND PLAN / ED COURSE       86 year old female with history of vertigo and hyponatremia presents to the ED complaining of ongoing dizziness.  She describes the dizziness as the room spinning around her but has been feeling relatively well recently with minimal dizziness and dizziness that is present has been alleviated by meclizine.  She is concerned that some of her symptoms may be related to hyponatremia though.  BMP was performed and shows her sodium to be 135 the remainder of her electrolytes to be within normal limits.  EKG is unremarkable and I doubt posterior stroke given minimal dizziness and association of her symptoms with positional changes.  She did complain of some shoulder pain secondary to fall, x-ray showed potential mild slip of her prior hardware, but this does not seem to be limiting her functionally at this time.  I have counseled her to follow-up with orthopedics as well as her PCP for recheck of her electrolytes.  Counseled to return to the ED for new or worsening symptoms, patient agrees with plan.       ____________________________________________   FINAL CLINICAL IMPRESSION(S) / ED DIAGNOSES  Final diagnoses:  Benign paroxysmal positional vertigo, unspecified laterality  Chronic hyponatremia  Chronic cough     ED Discharge Orders         Ordered    benzonatate (TESSALON PERLES) 100 MG capsule  3 times daily PRN     08/25/19 0953           Note:  This document was prepared using Dragon voice recognition software and may include unintentional dictation errors.   Blake Divine, MD 08/25/19 [id number]

## 2019-08-25 NOTE — ED Notes (Signed)
See triage note  Presents with some low sodium   Provider in room on arrival to treatment room

## 2019-08-25 NOTE — ED Triage Notes (Addendum)
Pt presents to ED via POV with c/o vertigo due to inner ear problem that was dx [place] several days ago. Pt states has been taking Meclizine. Pt also states that she had low sodium at [facility]. Pt states has been on ongoing problem with low sodium. Pt states she knows she needs to add salt to her diet however her sodium remains low even with adding salt to her diet. Pt states several days ago her sodium level was 127.    Pt states relief with meclizine pills. Pt states fall on 1/1, now c/o L shoulder pain.

## 2019-08-30 ENCOUNTER — Telehealth: Payer: Self-pay | Admitting: *Deleted

## 2019-08-30 NOTE — Telephone Encounter (Signed)
I do not think that is appropriate for [place] to treat. Has she tried tylenol or advil?

## 2019-08-30 NOTE — Telephone Encounter (Signed)
I returned call to patient, but was unable to reach her, Her voice mail did not work either

## 2019-08-30 NOTE — Telephone Encounter (Signed)
Patient caled reporting that she is having pulling and pain at her lumpectomy site (done in 2015) which is keeping her up at night and she is requesting pain medicine for it. She states it is from scar tissue. Per chart, she got Norco on 08/25/19 from Orthopedics. Please advise

## 2019-08-30 NOTE — Telephone Encounter (Signed)
I spoke with patient and advised her of physician response and she said that she knows she can take Advil or Tylenol and that she does not want medicine, just to know if anything can be done and that she will just live with it because she has for years

## 2019-09-04 ENCOUNTER — Emergency Department: Payer: Medicare Other

## 2019-09-04 ENCOUNTER — Encounter: Payer: Self-pay | Admitting: Emergency Medicine

## 2019-09-04 ENCOUNTER — Other Ambulatory Visit: Payer: Self-pay

## 2019-09-04 ENCOUNTER — Emergency Department
Admission: EM | Admit: 2019-09-04 | Discharge: 2019-09-04 | Disposition: A | Discharge status: Home / Self Care | Payer: Medicare Other | Attending: Student in an Organized Health Care Education/Training Program | Admitting: Student in an Organized Health Care Education/Training Program

## 2019-09-04 DIAGNOSIS — I10 Essential (primary) hypertension: Secondary | ICD-10-CM | POA: Diagnosis not present

## 2019-09-04 DIAGNOSIS — R55 Syncope and collapse: Secondary | ICD-10-CM | POA: Diagnosis present

## 2019-09-04 DIAGNOSIS — Z79899 Other long term (current) drug therapy: Secondary | ICD-10-CM | POA: Diagnosis not present

## 2019-09-04 DIAGNOSIS — E039 Hypothyroidism, unspecified: Secondary | ICD-10-CM | POA: Diagnosis not present

## 2019-09-04 DIAGNOSIS — Z853 Personal history of malignant neoplasm of breast: Secondary | ICD-10-CM | POA: Insufficient documentation

## 2019-09-04 LAB — BASIC METABOLIC PANEL
Anion gap: 14 (ref 5–15)
BUN: 17 mg/dL (ref 8–23)
CO2: 22 mmol/L (ref 22–32)
Calcium: 9.4 mg/dL (ref 8.9–10.3)
Chloride: 93 mmol/L — ABNORMAL LOW (ref 98–111)
Creatinine, Ser: 0.86 mg/dL (ref 0.44–1.00)
GFR calc Af Amer: >60 mL/min (ref >60)
GFR calc non Af Amer: >60 mL/min (ref >60)
Glucose, Bld: 149 mg/dL — ABNORMAL HIGH (ref 70–99)
Potassium: 4.1 mmol/L (ref 3.5–5.1)
Sodium: 129 mmol/L — ABNORMAL LOW (ref 135–145)

## 2019-09-04 LAB — CBC
HCT: 44 % (ref 36.0–46.0)
Hemoglobin: 15.1 g/dL — ABNORMAL HIGH (ref 12.0–15.0)
MCH: 31.7 pg (ref 26.0–34.0)
MCHC: 34.3 g/dL (ref 30.0–36.0)
MCV: 92.4 fL (ref 80.0–100.0)
Platelets: 271 10*3/uL (ref 150–400)
RBC: 4.76 MIL/uL (ref 3.87–5.11)
RDW: 14 % (ref 11.5–15.5)
WBC: 9.7 10*3/uL (ref 4.0–10.5)
nRBC: 0 % (ref 0.0–0.2)

## 2019-09-04 LAB — TROPONIN I (HIGH SENSITIVITY)
Troponin I (High Sensitivity): 6 ng/L (ref <18)
Troponin I (High Sensitivity): 9 ng/L (ref <18)

## 2019-09-04 MED ORDER — CLONIDINE HCL 0.1 MG PO TABS: 0.1000 mg | ORAL_TABLET | Freq: Once | ORAL | Status: DC

## 2019-09-04 MED ORDER — SODIUM CHLORIDE 0.9% FLUSH: 3.0000 mL | Freq: Once | INTRAVENOUS | Status: DC

## 2019-09-04 NOTE — ED Provider Notes (Signed)
[facility] Emergency Department Provider Note    First MD Initiated Contact with Patient 09/04/19 1701     (approximate)  I have reviewed the triage vital signs and the nursing notes.   HISTORY  Chief Complaint Near Syncope    HPI Janice Perkins is a 86 y.o. female with the below listed past medical history presents to the ER for concern over high blood pressure and feelig like she was about to pass out..  She currently feels well.    And states that she does have intermittent chest pains but is not currently having any pain.  She did not fall and hit her head.  States that she felt primarily not lightheaded earlier.  Has been drinking Gatorade but did not feel like that was helping.  She denies any abdominal pain.  No diarrhea. Has a h/o afib only on ASA 2/2 h/o GI bleeding.   No melena or hematochezia.   Past Medical History:  Diagnosis Date  . Asthma   . Atrial fibrillation ([place])   . Breast cancer ([place]) 2011  . Bronchitis 04/2015  . Cancer [facility]) 2011   breast  . COPD (chronic obstructive pulmonary disease) ([place])   . Hypertension   . Shingles    10/2015   Family History  Problem Relation Age of Onset  . Hypertension Mother   . Heart disease Mother   . Cancer Mother   . Stroke Father   . Hypertension Father   . Breast cancer Sister    Past Surgical History:  Procedure Laterality Date  . BREAST EXCISIONAL BIOPSY Right 11/29/13   two areas FAT NECROSIS  . BREAST EXCISIONAL BIOPSY Right 10/30/2009   lumpectomy - radiation  . COLONOSCOPY WITH PROPOFOL N/A 06/10/2018   Procedure: COLONOSCOPY WITH PROPOFOL;  Surgeon: Manya Silvas, MD;  Location: [facility] ENDOSCOPY;  Service: Endoscopy;  Laterality: N/A;  . COLONOSCOPY WITH PROPOFOL N/A 02/20/2019   Procedure: COLONOSCOPY WITH PROPOFOL;  Surgeon: Lucilla Lame, MD;  Location: [facility] ENDOSCOPY;  Service: Endoscopy;  Laterality: N/A;  . ESOPHAGOGASTRODUODENOSCOPY (EGD) WITH PROPOFOL N/A 06/10/2018   Procedure: ESOPHAGOGASTRODUODENOSCOPY (EGD) WITH PROPOFOL;  Surgeon: Manya Silvas, MD;  Location: [facility] ENDOSCOPY;  Service: Endoscopy;  Laterality: N/A;  . ESOPHAGOGASTRODUODENOSCOPY (EGD) WITH PROPOFOL N/A 02/20/2019   Procedure: ESOPHAGOGASTRODUODENOSCOPY (EGD) WITH PROPOFOL;  Surgeon: Lucilla Lame, MD;  Location: [facility] ENDOSCOPY;  Service: Endoscopy;  Laterality: N/A;  . NASAL SINUS SURGERY     Patient Active Problem List   Diagnosis Date Noted  . Acute encephalopathy 08/07/2019  . Fluid overload 06/09/2019  . Bronchitis 05/31/2019  . Hypothyroidism 05/31/2019  . Duodenum ulcer   . Melena   . GI bleed 02/17/2019  . Hypertensive encephalopathy 12/11/2018  . Hypoxia 05/30/2018  . Leukocytosis 04/05/2018  . Pneumonia 11/15/2017  . Pleural effusion 05/16/2017  . AF (paroxysmal atrial fibrillation) ([place]) 05/16/2017  . Breast cancer ([place]) 05/16/2017  . Dependence on nocturnal oxygen therapy 05/16/2017  . HTN (hypertension) 05/16/2017  . Generalized weakness 03/07/2017  . Hyponatremia 03/07/2017  . Dehydration 03/07/2017  . UTI (urinary tract infection) 03/07/2017  . HCAP (healthcare-associated pneumonia) 01/19/2017  . AKI (acute kidney injury) ([place]) 12/18/2016  . Primary cancer of upper outer quadrant of right female breast ([place]) 04/07/2016  . Lumbar radiculopathy 03/23/2016  . Spinal stenosis, lumbar region, with neurogenic claudication 03/23/2016  . DDD (degenerative disc disease), lumbar 01/14/2015  . Lumbosacral facet joint syndrome 01/14/2015  . Sacroiliac joint dysfunction 01/14/2015  . Greater  trochanteric bursitis 01/14/2015  . Bilateral occipital neuralgia 01/14/2015      Prior to Admission medications   Medication Sig Start Date End Date Taking? Authorizing Provider  albuterol (ACCUNEB) 1.25 MG/3ML nebulizer solution Inhale 3 mLs into the lungs every 6 (six) hours as needed for wheezing.     [provider]  albuterol (PROVENTIL HFA;VENTOLIN HFA) 108 (90  Base) MCG/ACT inhaler Inhale 2 puffs into the lungs every 6 (six) hours as needed for wheezing or shortness of breath.     [provider]  ALPRAZolam Duanne Moron) 0.5 MG tablet Take 0.5 mg by mouth 2 (two) times a day.     [provider]  alum & mag hydroxide-simeth (MAALOX/MYLANTA) 200-200-20 MG/5ML suspension Take 30 mLs by mouth every 4 (four) hours as needed for indigestion or heartburn. 06/08/19   Demetrios Loll, MD  atorvastatin (LIPITOR) 40 MG tablet Take 40 mg by mouth daily. 03/17/19   [provider]  benzonatate (TESSALON PERLES) 100 MG capsule Take 1 capsule (100 mg total) by mouth 3 (three) times daily as needed for cough. 08/25/19 08/24/20  Blake Divine, MD  busPIRone (BUSPAR) 10 MG tablet Take 10 mg by mouth 2 (two) times daily.    [provider]  cholecalciferol (VITAMIN D) 1000 units tablet Take 1,000 Units by mouth daily.    [provider]  cloNIDine (CATAPRES) 0.1 MG tablet Take 1 tablet (0.1 mg total) by mouth 3 (three) times daily. 08/08/19   Ezekiel Slocumb, DO  diltiazem (CARDIZEM CD) 180 MG 24 hr capsule Take 1 capsule (180 mg total) by mouth daily. 08/08/19   Ezekiel Slocumb, DO  famotidine (PEPCID) 20 MG tablet Take 20 mg by mouth 2 (two) times daily. 07/29/19   [provider]  ferrous sulfate 325 (65 FE) MG tablet Take 325 mg by mouth daily with breakfast.    [provider]  Fluticasone-Salmeterol (ADVAIR) 250-50 MCG/DOSE AEPB Inhale 1 puff into the lungs 2 (two) times daily.    [provider]  furosemide (LASIX) 20 MG tablet Take 1 tablet (20 mg total) by mouth daily. 06/10/19 06/09/20  Loletha Grayer, MD  gabapentin (NEURONTIN) 300 MG capsule Take 300 mg by mouth daily.    [provider]  hydrALAZINE (APRESOLINE) 50 MG tablet Take 50 mg by mouth 3 (three) times daily. 07/10/19   [provider]  HYDROcodone-acetaminophen (NORCO/VICODIN) 5-325 MG tablet Take 1 tablet by mouth 2  (two) times daily.  04/26/19   [provider]  levothyroxine (SYNTHROID, LEVOTHROID) 50 MCG tablet Take 50 mcg by mouth daily before breakfast.    [provider]  loratadine (CLARITIN) 10 MG tablet Take 10 mg by mouth daily.    [provider]  losartan (COZAAR) 50 MG tablet Take 2 tablets (100 mg total) by mouth at bedtime. 12/13/18   Loletha Grayer, MD  magnesium oxide (MAG-OX) 400 MG tablet Take 400 mg by mouth 2 (two) times daily.    [provider]  meloxicam (MOBIC) 15 MG tablet Take 15 mg by mouth daily. 02/07/19   [provider]  metoprolol succinate (TOPROL-XL) 25 MG 24 hr tablet Take 1 tablet (25 mg total) by mouth daily. 06/10/19   Loletha Grayer, MD  mometasone-formoterol (DULERA) 100-5 MCG/ACT AERO Inhale 2 puffs into the lungs 2 (two) times daily.    [provider]  polyethylene glycol (MIRALAX / GLYCOLAX) 17 g packet Take 17 g by mouth daily. 01/03/19   Earleen [place], MD  RABEprazole (ACIPHEX) 20 MG tablet Take 20 mg by mouth 2 (two) times daily.    [provider]  venlafaxine XR (EFFEXOR-XR) 75 MG 24 hr capsule Take 75 mg by mouth daily.    [provider]    Allergies Diphenhydramine hcl, Penicillins, Captopril, Eliquis [apixaban], Enalapril maleate, Tape, Terfenadine, Augmentin [amoxicillin-pot clavulanate], and Biaxin [clarithromycin]    Social History Social History   Tobacco Use  . Smoking status: Never Smoker  . Smokeless tobacco: Never Used  Substance Use Topics  . Alcohol use: No    Alcohol/week: 0.0 standard drinks  . Drug use: No    Review of Systems Patient denies headaches, rhinorrhea, blurry vision, numbness, shortness of breath, chest pain, edema, cough, abdominal pain, nausea, vomiting, diarrhea, dysuria, fevers, rashes or hallucinations unless otherwise stated above in HPI. ____________________________________________   PHYSICAL EXAM:  VITAL SIGNS: Vitals:    09/04/19 1616  BP: (!) 172/107  Pulse: 85  Resp: 20  Temp: 97.9 F (36.6 C)  SpO2: 98%    Constitutional: Alert, pleasant and in NAD.  Eyes: Conjunctivae are normal.  Head: Atraumatic. Nose: No congestion/rhinnorhea. Mouth/Throat: Mucous membranes are moist.   Neck: No stridor. Painless ROM.  Cardiovascular: Normal rate, regular rhythm. Grossly normal heart sounds.  Good peripheral circulation. Respiratory: Normal respiratory effort.  No retractions. Lungs CTAB. Gastrointestinal: Soft and nontender. No distention. No abdominal bruits. No CVA tenderness. Genitourinary:  Musculoskeletal: No lower extremity tenderness nor edema.  No joint effusions. Neurologic:  Normal speech and language. No gross focal neurologic deficits are appreciated. No facial droop Skin:  Skin is warm, dry and intact. No rash noted. Psychiatric: Mood and affect are normal. Speech and behavior are normal.  ____________________________________________   LABS (all labs ordered are listed, but only abnormal results are displayed)  Results for orders placed or performed during the hospital encounter of 09/04/19 (from the past 24 hour(s))  Basic metabolic panel     Status: Abnormal   Collection Time: 09/04/19  4:20 PM  Result Value Ref Range   Sodium 129 (L) 135 - 145 mmol/L   Potassium 4.1 3.5 - 5.1 mmol/L   Chloride 93 (L) 98 - 111 mmol/L   CO2 22 22 - 32 mmol/L   Glucose, Bld 149 (H) 70 - 99 mg/dL   BUN 17 8 - 23 mg/dL   Creatinine, Ser 0.86 0.44 - 1.00 mg/dL   Calcium 9.4 8.9 - 10.3 mg/dL   GFR calc non Af Amer >60 >60 mL/min   GFR calc Af Amer >60 >60 mL/min   Anion gap 14 5 - 15  CBC     Status: Abnormal   Collection Time: 09/04/19  4:20 PM  Result Value Ref Range   WBC 9.7 4.0 - 10.5 K/uL   RBC 4.76 3.87 - 5.11 MIL/uL   Hemoglobin 15.1 (H) 12.0 - 15.0 g/dL   HCT 44.0 36.0 - 46.0 %   MCV 92.4 80.0 - 100.0 fL   MCH 31.7 26.0 - 34.0 pg   MCHC 34.3 30.0 - 36.0 g/dL   RDW 14.0 11.5 - 15.5 %    Platelets 271 150 - 400 K/uL   nRBC 0.0 0.0 - 0.2 %  Troponin I (High Sensitivity)     Status: None   Collection Time: 09/04/19  4:20 PM  Result Value Ref Range   Troponin I (High Sensitivity) 6 <18 ng/L  Troponin I (High Sensitivity)     Status: None   Collection Time: 09/04/19  6:23 PM  Result Value Ref Range   Troponin I (High Sensitivity) 9 <18 ng/L   ____________________________________________  EKG My review and personal interpretation at Time: 16:18   Indication: weakness  Rate: 80  Rhythm: afib Axis: left Other: no stemi, normal intervals ____________________________________________  RADIOLOGY  I personally reviewed all radiographic images ordered to evaluate for the above acute complaints and reviewed radiology reports and findings.  These findings were personally discussed with the patient.  Please see medical record for radiology report.  ____________________________________________   PROCEDURES  Procedure(s) performed:  Procedures    Critical Care performed: no ____________________________________________   INITIAL IMPRESSION / ASSESSMENT AND PLAN / ED COURSE  Pertinent labs & imaging results that were available during my care of the patient were reviewed by me and considered in my medical decision making (see chart for details).   DDX: Hydration, electrolyte abnormality, dysrhythmia, CHF, ACS  MICHALINA HUSCHER is a 86 y.o. who presents to the ED with symptoms as described above.  Patient well-appearing in no acute distress.  She does not endorse any sort of discomfort or pain right now.  Neuro exam is reassuring.  Abdominal exam is also reassuring.  Given her symptoms as well as heart history will observe in the ER for serial enzymes.  Given her history of CHF do not want to give her IV fluids will trial p.o.  Clinical Course as of Sep 03 1956  Mon Sep 04, 2019  1745 Patient able to ambulate without any distress, hypoxia or tachypnea.  Will observe in ER for  repeat troponin.  If negative, anticipate discharge home.   [PR]  1946 Remains pain and symptom free.  Appears stable and appropriate for follow up with PCP.  Discussed need for close follow up for repeat BMP.   [PR]    Clinical Course User Index [PR] Merlyn Lot, MD    The patient was evaluated in Emergency Department today for the symptoms described in the history of present illness. He/she was evaluated in the context of the global COVID-19 pandemic, which necessitated consideration that the patient might be at risk for infection with the SARS-CoV-2 virus that causes COVID-19. Institutional protocols and algorithms that pertain to the evaluation of patients at risk for COVID-19 are in a state of rapid change based on information released by regulatory bodies including the CDC and federal and state organizations. These policies and algorithms were followed during the patient's care in the ED.  As part of my medical decision making, I reviewed the following data within the [place] notes reviewed and incorporated, Labs reviewed, notes from prior ED visits and [place] Controlled Substance Database   ____________________________________________   FINAL CLINICAL IMPRESSION(S) / ED DIAGNOSES  Final diagnoses:  Near syncope      NEW MEDICATIONS STARTED DURING THIS VISIT:  New Prescriptions   No medications on file     Note:  This document was prepared using Dragon voice recognition software and may include unintentional dictation errors.    Merlyn Lot, MD 09/04/19 1958

## 2019-09-04 NOTE — ED Triage Notes (Signed)
States near syncopal episode just prior to arrival. Slight chest pain.

## 2019-09-04 NOTE — ED Notes (Signed)
Pt given meal tray. Pt walked with x1 assist. Pt did not desat while walking. Pt stayed between 94%-100% while walking.

## 2019-09-04 NOTE — ED Notes (Signed)
Pt ate 80% of meal tray and one cup of water.

## 2019-09-08 ENCOUNTER — Other Ambulatory Visit: Payer: Self-pay

## 2019-09-08 ENCOUNTER — Emergency Department
Admission: EM | Admit: 2019-09-08 | Discharge: 2019-09-08 | Disposition: A | Discharge status: Home / Self Care | Payer: Medicare Other | Attending: Emergency Medicine | Admitting: Emergency Medicine

## 2019-09-08 ENCOUNTER — Encounter: Payer: Self-pay | Admitting: Emergency Medicine

## 2019-09-08 DIAGNOSIS — I10 Essential (primary) hypertension: Secondary | ICD-10-CM | POA: Diagnosis not present

## 2019-09-08 DIAGNOSIS — J449 Chronic obstructive pulmonary disease, unspecified: Secondary | ICD-10-CM | POA: Insufficient documentation

## 2019-09-08 DIAGNOSIS — E039 Hypothyroidism, unspecified: Secondary | ICD-10-CM | POA: Diagnosis not present

## 2019-09-08 DIAGNOSIS — Z79899 Other long term (current) drug therapy: Secondary | ICD-10-CM | POA: Diagnosis not present

## 2019-09-08 DIAGNOSIS — Z853 Personal history of malignant neoplasm of breast: Secondary | ICD-10-CM | POA: Diagnosis not present

## 2019-09-08 DIAGNOSIS — R195 Other fecal abnormalities: Secondary | ICD-10-CM

## 2019-09-08 LAB — COMPREHENSIVE METABOLIC PANEL
ALT: 25 U/L (ref 0–44)
AST: 26 U/L (ref 15–41)
Albumin: 4.6 g/dL (ref 3.5–5.0)
Alkaline Phosphatase: 53 U/L (ref 38–126)
Anion gap: 13 (ref 5–15)
BUN: 15 mg/dL (ref 8–23)
CO2: 22 mmol/L (ref 22–32)
Calcium: 9.8 mg/dL (ref 8.9–10.3)
Chloride: 98 mmol/L (ref 98–111)
Creatinine, Ser: 0.89 mg/dL (ref 0.44–1.00)
GFR calc Af Amer: >60 mL/min (ref >60)
GFR calc non Af Amer: 60 mL/min — ABNORMAL LOW (ref >60)
Glucose, Bld: 89 mg/dL (ref 70–99)
Potassium: 3.7 mmol/L (ref 3.5–5.1)
Sodium: 133 mmol/L — ABNORMAL LOW (ref 135–145)
Total Bilirubin: 0.8 mg/dL (ref 0.3–1.2)
Total Protein: 8.2 g/dL — ABNORMAL HIGH (ref 6.5–8.1)

## 2019-09-08 LAB — CBC WITH DIFFERENTIAL/PLATELET
Abs Immature Granulocytes: 0.09 10*3/uL — ABNORMAL HIGH (ref 0.00–0.07)
Basophils Absolute: 0.1 10*3/uL (ref 0.0–0.1)
Basophils Relative: 1 %
Eosinophils Absolute: 0.3 10*3/uL (ref 0.0–0.5)
Eosinophils Relative: 3 %
HCT: 40.6 % (ref 36.0–46.0)
Hemoglobin: 14 g/dL (ref 12.0–15.0)
Immature Granulocytes: 1 %
Lymphocytes Relative: 16 %
Lymphs Abs: 1.6 10*3/uL (ref 0.7–4.0)
MCH: 31.7 pg (ref 26.0–34.0)
MCHC: 34.5 g/dL (ref 30.0–36.0)
MCV: 92.1 fL (ref 80.0–100.0)
Monocytes Absolute: 1 10*3/uL (ref 0.1–1.0)
Monocytes Relative: 10 %
Neutro Abs: 7 10*3/uL (ref 1.7–7.7)
Neutrophils Relative %: 69 %
Platelets: 232 10*3/uL (ref 150–400)
RBC: 4.41 MIL/uL (ref 3.87–5.11)
RDW: 14.1 % (ref 11.5–15.5)
WBC: 10 10*3/uL (ref 4.0–10.5)
nRBC: 0 % (ref 0.0–0.2)

## 2019-09-08 LAB — CBC
HCT: 45.3 % (ref 36.0–46.0)
Hemoglobin: 15.5 g/dL — ABNORMAL HIGH (ref 12.0–15.0)
MCH: 31.6 pg (ref 26.0–34.0)
MCHC: 34.2 g/dL (ref 30.0–36.0)
MCV: 92.4 fL (ref 80.0–100.0)
Platelets: 301 10*3/uL (ref 150–400)
RBC: 4.9 MIL/uL (ref 3.87–5.11)
RDW: 14.1 % (ref 11.5–15.5)
WBC: 13.2 10*3/uL — ABNORMAL HIGH (ref 4.0–10.5)
nRBC: 0 % (ref 0.0–0.2)

## 2019-09-08 LAB — TYPE AND SCREEN
ABO/RH(D): A POS
Antibody Screen: NEGATIVE

## 2019-09-08 NOTE — Discharge Instructions (Addendum)
At this time, there are no signs of blood in your stool or any active blood loss.  Your sodium level is normal.  Return to the ER for new, worsening, or persistent dark, tarry, or bloody stools, abdominal pain, weakness or lightheadedness, shortness of breath, or any other new or worsening symptoms that concern you.

## 2019-09-08 NOTE — ED Notes (Signed)
Assisted MD with occult blood test. Pt provided cell to call husband.

## 2019-09-08 NOTE — ED Triage Notes (Addendum)
Pt here for dark stools for 1-2 weeks. Called dr elliots office and they told her to come to ED for evaluation. Pt does take iron.  Has been followed for hyponatremia but no other new problems per pt.  Ambulatory without difficulty. VSS.  No pain. Pt currently on abx for UTI

## 2019-09-08 NOTE — ED Provider Notes (Signed)
[facility] Emergency Department Provider Note ____________________________________________   First MD Initiated Contact with Patient 09/08/19 1342     (approximate)  I have reviewed the triage vital signs and the nursing notes.   HISTORY  Chief Complaint Melena    HPI Janice Perkins is a 86 y.o. female with PMH as noted below who presents with dark stools over the last several days, described as somewhat black and liquid, and not associated with dizziness, weakness, abdominal pain, fever, or other acute symptoms.  The patient did recently start on iron supplementation.  She is also on an antibiotic for a UTI.  She was admitted last summer with concern for possible GI bleed with negative upper and lower endoscopy.  Past Medical History:  Diagnosis Date  . Asthma   . Atrial fibrillation ([place])   . Breast cancer ([place]) 2011  . Bronchitis 04/2015  . Cancer [facility]) 2011   breast  . COPD (chronic obstructive pulmonary disease) ([place])   . Hypertension   . Shingles    10/2015    Patient Active Problem List   Diagnosis Date Noted  . Acute encephalopathy 08/07/2019  . Fluid overload 06/09/2019  . Bronchitis 05/31/2019  . Hypothyroidism 05/31/2019  . Duodenum ulcer   . Melena   . GI bleed 02/17/2019  . Hypertensive encephalopathy 12/11/2018  . Hypoxia 05/30/2018  . Leukocytosis 04/05/2018  . Pneumonia 11/15/2017  . Pleural effusion 05/16/2017  . AF (paroxysmal atrial fibrillation) ([place]) 05/16/2017  . Breast cancer ([place]) 05/16/2017  . Dependence on nocturnal oxygen therapy 05/16/2017  . HTN (hypertension) 05/16/2017  . Generalized weakness 03/07/2017  . Hyponatremia 03/07/2017  . Dehydration 03/07/2017  . UTI (urinary tract infection) 03/07/2017  . HCAP (healthcare-associated pneumonia) 01/19/2017  . AKI (acute kidney injury) ([place]) 12/18/2016  . Primary cancer of upper outer quadrant of right female breast ([place]) 04/07/2016  . Lumbar radiculopathy  03/23/2016  . Spinal stenosis, lumbar region, with neurogenic claudication 03/23/2016  . DDD (degenerative disc disease), lumbar 01/14/2015  . Lumbosacral facet joint syndrome 01/14/2015  . Sacroiliac joint dysfunction 01/14/2015  . Greater trochanteric bursitis 01/14/2015  . Bilateral occipital neuralgia 01/14/2015    Past Surgical History:  Procedure Laterality Date  . BREAST EXCISIONAL BIOPSY Right 11/29/13   two areas FAT NECROSIS  . BREAST EXCISIONAL BIOPSY Right 10/30/2009   lumpectomy - radiation  . COLONOSCOPY WITH PROPOFOL N/A 06/10/2018   Procedure: COLONOSCOPY WITH PROPOFOL;  Surgeon: Manya Silvas, MD;  Location: [facility] ENDOSCOPY;  Service: Endoscopy;  Laterality: N/A;  . COLONOSCOPY WITH PROPOFOL N/A 02/20/2019   Procedure: COLONOSCOPY WITH PROPOFOL;  Surgeon: Lucilla Lame, MD;  Location[facility] ENDOSCOPY;  Service: Endoscopy;  Laterality: N/A;  . ESOPHAGOGASTRODUODENOSCOPY (EGD) WITH PROPOFOL N/A 06/10/2018   Procedure: ESOPHAGOGASTRODUODENOSCOPY (EGD) WITH PROPOFOL;  Surgeon: Manya Silvas, MD;  Location: [facility] ENDOSCOPY;  Service: Endoscopy;  Laterality: N/A;  . ESOPHAGOGASTRODUODENOSCOPY (EGD) WITH PROPOFOL N/A 02/20/2019   Procedure: ESOPHAGOGASTRODUODENOSCOPY (EGD) WITH PROPOFOL;  Surgeon: Lucilla Lame, MD;  Location: [facility] ENDOSCOPY;  Service: Endoscopy;  Laterality: N/A;  . NASAL SINUS SURGERY      Prior to Admission medications   Medication Sig Start Date End Date Taking? Authorizing Provider  albuterol (ACCUNEB) 1.25 MG/3ML nebulizer solution Inhale 3 mLs into the lungs every 6 (six) hours as needed for wheezing.     [provider]  albuterol (PROVENTIL HFA;VENTOLIN HFA) 108 (90 Base) MCG/ACT inhaler Inhale 2 puffs into the lungs every 6 (six) hours as needed  for wheezing or shortness of breath.     [provider]  ALPRAZolam Duanne Moron) 0.5 MG tablet Take 0.5 mg by mouth 2 (two) times a day.     [provider]  alum & mag hydroxide-simeth  (MAALOX/MYLANTA) 200-200-20 MG/5ML suspension Take 30 mLs by mouth every 4 (four) hours as needed for indigestion or heartburn. 06/08/19   Demetrios Loll, MD  atorvastatin (LIPITOR) 40 MG tablet Take 40 mg by mouth daily. 03/17/19   [provider]  benzonatate (TESSALON PERLES) 100 MG capsule Take 1 capsule (100 mg total) by mouth 3 (three) times daily as needed for cough. 08/25/19 08/24/20  Blake Divine, MD  busPIRone (BUSPAR) 10 MG tablet Take 10 mg by mouth 2 (two) times daily.    [provider]  cholecalciferol (VITAMIN D) 1000 units tablet Take 1,000 Units by mouth daily.    [provider]  cloNIDine (CATAPRES) 0.1 MG tablet Take 1 tablet (0.1 mg total) by mouth 3 (three) times daily. 08/08/19   Ezekiel Slocumb, DO  diltiazem (CARDIZEM CD) 180 MG 24 hr capsule Take 1 capsule (180 mg total) by mouth daily. 08/08/19   Ezekiel Slocumb, DO  famotidine (PEPCID) 20 MG tablet Take 20 mg by mouth 2 (two) times daily. 07/29/19   [provider]  ferrous sulfate 325 (65 FE) MG tablet Take 325 mg by mouth daily with breakfast.    [provider]  Fluticasone-Salmeterol (ADVAIR) 250-50 MCG/DOSE AEPB Inhale 1 puff into the lungs 2 (two) times daily.    [provider]  furosemide (LASIX) 20 MG tablet Take 1 tablet (20 mg total) by mouth daily. 06/10/19 06/09/20  Loletha Grayer, MD  gabapentin (NEURONTIN) 300 MG capsule Take 300 mg by mouth daily.    [provider]  hydrALAZINE (APRESOLINE) 50 MG tablet Take 50 mg by mouth 3 (three) times daily. 07/10/19   [provider]  HYDROcodone-acetaminophen (NORCO/VICODIN) 5-325 MG tablet Take 1 tablet by mouth 2 (two) times daily.  04/26/19   [provider]  levothyroxine (SYNTHROID, LEVOTHROID) 50 MCG tablet Take 50 mcg by mouth daily before breakfast.    [provider]  loratadine (CLARITIN) 10 MG tablet Take 10 mg by mouth daily.    [provider]  losartan  (COZAAR) 50 MG tablet Take 2 tablets (100 mg total) by mouth at bedtime. 12/13/18   Loletha Grayer, MD  magnesium oxide (MAG-OX) 400 MG tablet Take 400 mg by mouth 2 (two) times daily.    [provider]  meloxicam (MOBIC) 15 MG tablet Take 15 mg by mouth daily. 02/07/19   [provider]  metoprolol succinate (TOPROL-XL) 25 MG 24 hr tablet Take 1 tablet (25 mg total) by mouth daily. 06/10/19   Loletha Grayer, MD  mometasone-formoterol (DULERA) 100-5 MCG/ACT AERO Inhale 2 puffs into the lungs 2 (two) times daily.    [provider]  polyethylene glycol (MIRALAX / GLYCOLAX) 17 g packet Take 17 g by mouth daily. 01/03/19   Earleen Newport, MD  RABEprazole (ACIPHEX) 20 MG tablet Take 20 mg by mouth 2 (two) times daily.    [provider]  venlafaxine XR (EFFEXOR-XR) 75 MG 24 hr capsule Take 75 mg by mouth daily.    [provider]    Allergies Diphenhydramine hcl, Penicillins, Captopril, Eliquis [apixaban], Enalapril maleate, Tape, Terfenadine, Augmentin [amoxicillin-pot clavulanate], and Biaxin [clarithromycin]  Family History  Problem Relation Age of Onset  . Hypertension Mother   . Heart  disease Mother   . Cancer Mother   . Stroke Father   . Hypertension Father   . Breast cancer Sister     Social History Social History   Tobacco Use  . Smoking status: Never Smoker  . Smokeless tobacco: Never Used  Substance Use Topics  . Alcohol use: No    Alcohol/week: 0.0 standard drinks  . Drug use: No    Review of Systems  Constitutional: No fever/chills. Eyes: No visual changes. ENT: No sore throat. Cardiovascular: Denies chest pain. Respiratory: Denies shortness of breath. Gastrointestinal: No vomiting or diarrhea.  Genitourinary: Negative for dysuria or hematuria.  Musculoskeletal: Negative for back pain. Skin: Negative for rash. Neurological: Negative for headaches, focal weakness or  numbness.   ____________________________________________   PHYSICAL EXAM:  VITAL SIGNS: ED Triage Vitals  Enc Vitals Group     BP 09/08/19 1137 (!) 204/81     Pulse Rate 09/08/19 1137 100     Resp 09/08/19 1137 18     Temp 09/08/19 1137 97.6 F (36.4 C)     Temp Source 09/08/19 1137 Oral     SpO2 09/08/19 1137 99 %     Weight 09/08/19 1135 162 lb (73.5 kg)     Height 09/08/19 1135 5' (1.524 m)     Head Circumference --      Peak Flow --      Pain Score 09/08/19 1135 0     Pain Loc --      Pain Edu? --      Excl. in [place]? --     Constitutional: Alert and oriented. Well appearing for age and in no acute distress. Eyes: Conjunctivae are normal.  No scleral icterus. Head: Atraumatic. Nose: No congestion/rhinnorhea. Mouth/Throat: Mucous membranes are moist.   Neck: Normal range of motion.  Cardiovascular: Normal rate, regular rhythm. Good peripheral circulation. Respiratory: Normal respiratory effort.  No retractions. Gastrointestinal: Soft and nontender. No distention.  Brown stool, guaiac negative on DRE. Genitourinary: No flank tenderness. Musculoskeletal: Extremities warm and well perfused.  Neurologic:  Normal speech and language. No gross focal neurologic deficits are appreciated.  Skin:  Skin is warm and dry. No rash noted. Psychiatric: Mood and affect are normal. Speech and behavior are normal.  ____________________________________________   LABS (all labs ordered are listed, but only abnormal results are displayed)  Labs Reviewed  COMPREHENSIVE METABOLIC PANEL - Abnormal; Notable for the following components:      Result Value   Sodium 133 (*)    Total Protein 8.2 (*)    GFR calc non Af Amer 60 (*)    All other components within normal limits  CBC - Abnormal; Notable for the following components:   WBC 13.2 (*)    Hemoglobin 15.5 (*)    All other components within normal limits  CBC WITH DIFFERENTIAL/PLATELET - Abnormal; Notable for the following  components:   Abs Immature Granulocytes 0.09 (*)    All other components within normal limits  POC OCCULT BLOOD, ED  TYPE AND SCREEN   ____________________________________________  EKG   ____________________________________________  RADIOLOGY    ____________________________________________   PROCEDURES  Procedure(s) performed: No  Procedures  Critical Care performed: No ____________________________________________   INITIAL IMPRESSION / ASSESSMENT AND PLAN / ED COURSE  Pertinent labs & imaging results that were available during my care of the patient were reviewed by me and considered in my medical decision making (see chart for details).  86 year old female with PMH as noted above presents with dark  stools over the last several days.  The patient is on iron.  She has no abdominal pain, weakness, lightheadedness, or shortness of breath.  I reviewed the past medical records in [place].  The patient was admitted back in July with melena, and had reassuring upper and lower endoscopy.  The melena was determined to be likely due to epistaxis.  The patient had been on Eliquis at that time for atrial fibrillation, however she is only on ASA now.  The patient was subsequent admitted in August and October for hyponatremia, and again in October for fluid overload and CHF.  I saw the patient last month with transient confusion.  On exam today, the patient is well-appearing and her vital signs are normal.  The abdomen is soft and nontender.  Patient has dark brown stool on DRE with no evidence of bleeding.  The sample I obtained was guaiac negative.  Initial lab work-up is reassuring.  The patient's hemoglobin and sodium are both within normal limits.  Overall I suspect most likely dark stools related to the iron.  We will obtain a repeat CBC after 4 hours.  If there is no significant change, the patient will be appropriate for discharge  home.  ----------------------------------------- 4:45 PM on 09/08/2019 -----------------------------------------  The hemoglobin remains stable (there is a 1.5 point drop, however both the platelets and WBCs are also slightly lower on repeat, suggestive of a dilutional effect rather than any clinically relevant drop).  At this time, the patient is stable for discharge home.  I counseled her on the results of the work-up and on return precautions and she expresses understanding.  ____________________________________________   FINAL CLINICAL IMPRESSION(S) / ED DIAGNOSES  Final diagnoses:  Dark stools      NEW MEDICATIONS STARTED DURING THIS VISIT:  New Prescriptions   No medications on file     Note:  This document was prepared using Dragon voice recognition software and may include unintentional dictation errors.   Arta Silence, MD 09/08/19 [phone]

## 2019-09-18 ENCOUNTER — Encounter: Payer: Self-pay | Admitting: Emergency Medicine

## 2019-09-18 ENCOUNTER — Emergency Department
Admission: EM | Admit: 2019-09-18 | Discharge: 2019-09-18 | Disposition: A | Discharge status: Home / Self Care | Payer: Medicare Other | Attending: Emergency Medicine | Admitting: Emergency Medicine

## 2019-09-18 ENCOUNTER — Emergency Department: Payer: Medicare Other

## 2019-09-18 ENCOUNTER — Other Ambulatory Visit: Payer: Self-pay

## 2019-09-18 DIAGNOSIS — I4891 Unspecified atrial fibrillation: Secondary | ICD-10-CM | POA: Diagnosis not present

## 2019-09-18 DIAGNOSIS — Z79899 Other long term (current) drug therapy: Secondary | ICD-10-CM | POA: Diagnosis not present

## 2019-09-18 DIAGNOSIS — I10 Essential (primary) hypertension: Secondary | ICD-10-CM | POA: Diagnosis not present

## 2019-09-18 DIAGNOSIS — E039 Hypothyroidism, unspecified: Secondary | ICD-10-CM | POA: Diagnosis not present

## 2019-09-18 DIAGNOSIS — J449 Chronic obstructive pulmonary disease, unspecified: Secondary | ICD-10-CM | POA: Diagnosis not present

## 2019-09-18 DIAGNOSIS — J209 Acute bronchitis, unspecified: Secondary | ICD-10-CM | POA: Diagnosis not present

## 2019-09-18 DIAGNOSIS — J40 Bronchitis, not specified as acute or chronic: Secondary | ICD-10-CM

## 2019-09-18 LAB — CBC
HCT: 40.1 % (ref 36.0–46.0)
Hemoglobin: 13.9 g/dL (ref 12.0–15.0)
MCH: 31.7 pg (ref 26.0–34.0)
MCHC: 34.7 g/dL (ref 30.0–36.0)
MCV: 91.3 fL (ref 80.0–100.0)
Platelets: 221 10*3/uL (ref 150–400)
RBC: 4.39 MIL/uL (ref 3.87–5.11)
RDW: 13.7 % (ref 11.5–15.5)
WBC: 7.8 10*3/uL (ref 4.0–10.5)
nRBC: 0 % (ref 0.0–0.2)

## 2019-09-18 LAB — BASIC METABOLIC PANEL
Anion gap: 13 (ref 5–15)
BUN: 12 mg/dL (ref 8–23)
CO2: 24 mmol/L (ref 22–32)
Calcium: 9.5 mg/dL (ref 8.9–10.3)
Chloride: 92 mmol/L — ABNORMAL LOW (ref 98–111)
Creatinine, Ser: 0.81 mg/dL (ref 0.44–1.00)
GFR calc Af Amer: >60 mL/min (ref >60)
GFR calc non Af Amer: >60 mL/min (ref >60)
Glucose, Bld: 129 mg/dL — ABNORMAL HIGH (ref 70–99)
Potassium: 4 mmol/L (ref 3.5–5.1)
Sodium: 129 mmol/L — ABNORMAL LOW (ref 135–145)

## 2019-09-18 LAB — TROPONIN I (HIGH SENSITIVITY): Troponin I (High Sensitivity): 10 ng/L (ref <18)

## 2019-09-18 MED ORDER — AZITHROMYCIN 250 MG PO TABS: ORAL_TABLET | ORAL | 0 refills | Status: DC

## 2019-09-18 MED ORDER — BENZONATATE 100 MG PO CAPS: 100.0000 mg | ORAL_CAPSULE | Freq: Three times a day (TID) | ORAL | 0 refills | Status: DC | PRN

## 2019-09-18 MED ORDER — SODIUM CHLORIDE 0.9% FLUSH: 3.0000 mL | Freq: Once | INTRAVENOUS | Status: DC

## 2019-09-18 NOTE — ED Notes (Signed)
Pt states being at home and having bronchitis and that her blood pressure was high at home. Pt's blood pressure was 152/87 with this nurse. Pt states feeling slightly under the weather from her bronchitis but that she mostly came in for her high blood pressure.

## 2019-09-18 NOTE — Discharge Instructions (Addendum)
For your blood pressure: -Continue your current medications as prescribed. -CALL DR. HANDE TOMORROW - he may want to increase your medications to help with your blood pressure (ask about Hydralazine in particular) -AVOID any cough/cold remedies except for: CORICIDIN HBP (high blood pressure). This won't increase your pressure.  If your blood pressure is >180 on the top or >100 on the bottom: 1.) Sit in a dark, quiet room and re-check in 10 min 2.) Re-check, if it remains >180/100, you can take one Clonidine 0.1 mg tablet then remain sitting, resting 3.) Give the medication at least 1 hour to work. If blood pressure remains elevated >180/100, take a second dose of 0.1 mg. Call your doctor at this time.  The clonidine can be taken as 0.1 mg every 1 hour for UP TO three doses ONLY IF NEEDED. If this is the case, CALL DR. HANDE or your cardiologist to decide if you should be seen,

## 2019-09-18 NOTE — ED Notes (Signed)
Opal Sidles RN, First Nurse notified about patients blood pressure.

## 2019-09-18 NOTE — ED Provider Notes (Signed)
[facility] Emergency Department Provider Note  ____________________________________________   First MD Initiated Contact with Patient 09/18/19 1653     (approximate)  I have reviewed the triage vital signs and the nursing notes.   HISTORY  Chief Complaint Hypertension    HPI Janice Perkins is a 86 y.o. female with extensive past medical history as below including A. fib, COPD, difficult to control hypertension, here with reported hypertension.  The patient states that over the last several days, she feels like she has had a bronchitis.  She saw her PCP for this and was given some Tessalon.  She states she is had persistent cough and has been taking over-the-counter cough medicine for this.  She has also noticed that she has felt somewhat more fatigued than usual.  She has an outpatient Covid test pending per her report.  She states that she took her blood pressure this morning, and she was significantly hypertensive, with blood pressures in the 180s over 100s.  She took an extra dose of clonidine and it remained elevated, so she took a second dose.  She ultimately ended up taking a third dose, which eventually decreased her pressure to the 170s.  Since then, however, she has had persistent hypertension so she presents for evaluation.  She denies any headache, double vision, chest pain, shortness of breath, back pain, or other complaints.  No increased lower extremity edema.  Nursing medication changes.        Past Medical History:  Diagnosis Date  . Asthma   . Atrial fibrillation ([place])   . Breast cancer ([place]) 2011  . Bronchitis 04/2015  . Cancer [facility]) 2011   breast  . COPD (chronic obstructive pulmonary disease) ([place])   . Hypertension   . Shingles    10/2015    Patient Active Problem List   Diagnosis Date Noted  . Acute encephalopathy 08/07/2019  . Fluid overload 06/09/2019  . Bronchitis 05/31/2019  . Hypothyroidism 05/31/2019  . Duodenum ulcer   .  Melena   . GI bleed 02/17/2019  . Hypertensive encephalopathy 12/11/2018  . Hypoxia 05/30/2018  . Leukocytosis 04/05/2018  . Pneumonia 11/15/2017  . Pleural effusion 05/16/2017  . AF (paroxysmal atrial fibrillation) ([place]) 05/16/2017  . Breast cancer ([place]) 05/16/2017  . Dependence on nocturnal oxygen therapy 05/16/2017  . HTN (hypertension) 05/16/2017  . Generalized weakness 03/07/2017  . Hyponatremia 03/07/2017  . Dehydration 03/07/2017  . UTI (urinary tract infection) 03/07/2017  . HCAP (healthcare-associated pneumonia) 01/19/2017  . AKI (acute kidney injury) ([place]) 12/18/2016  . Primary cancer of upper outer quadrant of right female breast ([place]) 04/07/2016  . Lumbar radiculopathy 03/23/2016  . Spinal stenosis, lumbar region, with neurogenic claudication 03/23/2016  . DDD (degenerative disc disease), lumbar 01/14/2015  . Lumbosacral facet joint syndrome 01/14/2015  . Sacroiliac joint dysfunction 01/14/2015  . Greater trochanteric bursitis 01/14/2015  . Bilateral occipital neuralgia 01/14/2015    Past Surgical History:  Procedure Laterality Date  . BREAST EXCISIONAL BIOPSY Right 11/29/13   two areas FAT NECROSIS  . BREAST EXCISIONAL BIOPSY Right 10/30/2009   lumpectomy - radiation  . COLONOSCOPY WITH PROPOFOL N/A 06/10/2018   Procedure: COLONOSCOPY WITH PROPOFOL;  Surgeon: Manya Silvas, MD;  Location: [facility] ENDOSCOPY;  Service: Endoscopy;  Laterality: N/A;  . COLONOSCOPY WITH PROPOFOL N/A 02/20/2019   Procedure: COLONOSCOPY WITH PROPOFOL;  Surgeon: Lucilla Lame, MD;  Location: [facility] ENDOSCOPY;  Service: Endoscopy;  Laterality: N/A;  . ESOPHAGOGASTRODUODENOSCOPY (EGD) WITH PROPOFOL N/A 06/10/2018  Procedure: ESOPHAGOGASTRODUODENOSCOPY (EGD) WITH PROPOFOL;  Surgeon: Manya Silvas, MD;  Location: [facility] ENDOSCOPY;  Service: Endoscopy;  Laterality: N/A;  . ESOPHAGOGASTRODUODENOSCOPY (EGD) WITH PROPOFOL N/A 02/20/2019   Procedure: ESOPHAGOGASTRODUODENOSCOPY (EGD) WITH PROPOFOL;   Surgeon: Lucilla Lame, MD;  Location: [facility] ENDOSCOPY;  Service: Endoscopy;  Laterality: N/A;  . NASAL SINUS SURGERY      Prior to Admission medications   Medication Sig Start Date End Date Taking? Authorizing Provider  albuterol (ACCUNEB) 1.25 MG/3ML nebulizer solution Inhale 3 mLs into the lungs every 6 (six) hours as needed for wheezing.     [provider]  albuterol (PROVENTIL HFA;VENTOLIN HFA) 108 (90 Base) MCG/ACT inhaler Inhale 2 puffs into the lungs every 6 (six) hours as needed for wheezing or shortness of breath.     [provider]  ALPRAZolam Duanne Moron) 0.5 MG tablet Take 0.5 mg by mouth 2 (two) times a day.     [provider]  alum & mag hydroxide-simeth (MAALOX/MYLANTA) 200-200-20 MG/5ML suspension Take 30 mLs by mouth every 4 (four) hours as needed for indigestion or heartburn. 06/08/19   Demetrios Loll, MD  atorvastatin (LIPITOR) 40 MG tablet Take 40 mg by mouth daily. 03/17/19   [provider]  azithromycin (ZITHROMAX Z-PAK) 250 MG tablet Take 2 tablets (500 mg) on  Day 1,  followed by 1 tablet (250 mg) once daily on Days 2 through 5. 09/18/19   Duffy Bruce, MD  benzonatate (TESSALON PERLES) 100 MG capsule Take 1 capsule (100 mg total) by mouth 3 (three) times daily as needed for cough. 08/25/19 08/24/20  Blake Divine, MD  benzonatate (TESSALON PERLES) 100 MG capsule Take 1 capsule (100 mg total) by mouth 3 (three) times daily as needed for cough. 09/18/19 09/17/20  Duffy Bruce, MD  busPIRone (BUSPAR) 10 MG tablet Take 10 mg by mouth 2 (two) times daily.    [provider]  cholecalciferol (VITAMIN D) 1000 units tablet Take 1,000 Units by mouth daily.    [provider]  cloNIDine (CATAPRES) 0.1 MG tablet Take 1 tablet (0.1 mg total) by mouth 3 (three) times daily. 08/08/19   Ezekiel Slocumb, DO  diltiazem (CARDIZEM CD) 180 MG 24 hr capsule Take 1 capsule (180 mg total) by mouth daily. 08/08/19   Ezekiel Slocumb, DO  famotidine  (PEPCID) 20 MG tablet Take 20 mg by mouth 2 (two) times daily. 07/29/19   [provider]  ferrous sulfate 325 (65 FE) MG tablet Take 325 mg by mouth daily with breakfast.    [provider]  Fluticasone-Salmeterol (ADVAIR) 250-50 MCG/DOSE AEPB Inhale 1 puff into the lungs 2 (two) times daily.    [provider]  furosemide (LASIX) 20 MG tablet Take 1 tablet (20 mg total) by mouth daily. 06/10/19 06/09/20  Loletha Grayer, MD  gabapentin (NEURONTIN) 300 MG capsule Take 300 mg by mouth daily.    [provider]  hydrALAZINE (APRESOLINE) 50 MG tablet Take 50 mg by mouth 3 (three) times daily. 07/10/19   [provider]  HYDROcodone-acetaminophen (NORCO/VICODIN) 5-325 MG tablet Take 1 tablet by mouth 2 (two) times daily.  04/26/19   [provider]  levothyroxine (SYNTHROID, LEVOTHROID) 50 MCG tablet Take 50 mcg by mouth daily before breakfast.    [provider]  loratadine (CLARITIN) 10 MG tablet Take 10 mg by mouth daily.    [provider]  losartan (COZAAR) 50 MG tablet Take 2 tablets (100 mg total) by mouth at bedtime. 12/13/18  Loletha Grayer, MD  magnesium oxide (MAG-OX) 400 MG tablet Take 400 mg by mouth 2 (two) times daily.    [provider]  meloxicam (MOBIC) 15 MG tablet Take 15 mg by mouth daily. 02/07/19   [provider]  metoprolol succinate (TOPROL-XL) 25 MG 24 hr tablet Take 1 tablet (25 mg total) by mouth daily. 06/10/19   Loletha Grayer, MD  mometasone-formoterol (DULERA) 100-5 MCG/ACT AERO Inhale 2 puffs into the lungs 2 (two) times daily.    [provider]  polyethylene glycol (MIRALAX / GLYCOLAX) 17 g packet Take 17 g by mouth daily. 01/03/19   Earleen Newport, MD  RABEprazole (ACIPHEX) 20 MG tablet Take 20 mg by mouth 2 (two) times daily.    [provider]  venlafaxine XR (EFFEXOR-XR) 75 MG 24 hr capsule Take 75 mg by mouth daily.    [provider]     Allergies Diphenhydramine hcl, Penicillins, Captopril, Eliquis [apixaban], Enalapril maleate, Tape, Terfenadine, Augmentin [amoxicillin-pot clavulanate], and Biaxin [clarithromycin]  Family History  Problem Relation Age of Onset  . Hypertension Mother   . Heart disease Mother   . Cancer Mother   . Stroke Father   . Hypertension Father   . Breast cancer Sister     Social History Social History   Tobacco Use  . Smoking status: Never Smoker  . Smokeless tobacco: Never Used  Substance Use Topics  . Alcohol use: No    Alcohol/week: 0.0 standard drinks  . Drug use: No    Review of Systems  Review of Systems  Constitutional: Positive for fatigue. Negative for chills and fever.  HENT: Negative for sore throat.   Respiratory: Positive for cough and wheezing. Negative for shortness of breath.   Cardiovascular: Negative for chest pain.  Gastrointestinal: Negative for abdominal pain.  Genitourinary: Negative for flank pain.  Musculoskeletal: Negative for neck pain.  Skin: Negative for rash and wound.  Allergic/Immunologic: Negative for immunocompromised state.  Neurological: Negative for weakness and numbness.  Hematological: Does not bruise/bleed easily.  All other systems reviewed and are negative.    ____________________________________________  PHYSICAL EXAM:      VITAL SIGNS: ED Triage Vitals  Enc Vitals Group     BP 09/18/19 1512 (!) 187/120     Pulse Rate 09/18/19 1512 84     Resp 09/18/19 1512 20     Temp 09/18/19 1512 97.7 F (36.5 C)     Temp Source 09/18/19 1512 Oral     SpO2 09/18/19 1512 98 %     Weight 09/18/19 1511 162 lb 0.6 oz (73.5 kg)     Height --      Head Circumference --      Peak Flow --      Pain Score 09/18/19 1511 0     Pain Loc --      Pain Edu? --      Excl. in [place]? --      Physical Exam Vitals and nursing note reviewed.  Constitutional:      General: She is not in acute distress.    Appearance: She is well-developed.  HENT:      Head: Normocephalic and atraumatic.  Eyes:     Conjunctiva/sclera: Conjunctivae normal.  Cardiovascular:     Rate and Rhythm: Normal rate and regular rhythm.     Heart sounds: Normal heart sounds.  Pulmonary:     Effort: Pulmonary effort is normal. No respiratory distress.     Breath sounds: No wheezing.  Comments: Normal work of breathing.  Possible faint, end expiratory wheezes only on forced prolonged expiration. Abdominal:     General: There is no distension.  Musculoskeletal:     Cervical back: Neck supple.  Skin:    General: Skin is warm.     Capillary Refill: Capillary refill takes less than 2 seconds.     Findings: No rash.  Neurological:     Mental Status: She is alert and oriented to person, place, and time.     Motor: No abnormal muscle tone.       ____________________________________________   LABS (all labs ordered are listed, but only abnormal results are displayed)  Labs Reviewed  BASIC METABOLIC PANEL - Abnormal; Notable for the following components:      Result Value   Sodium 129 (*)    Chloride 92 (*)    Glucose, Bld 129 (*)    All other components within normal limits  CBC  TROPONIN I (HIGH SENSITIVITY)  TROPONIN I (HIGH SENSITIVITY)    ____________________________________________  EKG: Atrial fibrillation, VR 73. QRS 90, QTc 442. Non-specific ST changes but no new ST elevations or depressions. No acute change from prior. ________________________________________  RADIOLOGY All imaging, including plain films, CT scans, and ultrasounds, independently reviewed by me, and interpretations confirmed via formal radiology reads.  ED MD interpretation:   CXR: Reviewed, negative  Official radiology report(s): DG Chest 2 View  Result Date: 09/18/2019 CLINICAL DATA:  86 year old female with hypertension and cough. EXAM: CHEST - 2 VIEW COMPARISON:  Chest radiograph dated 09/04/2019. FINDINGS: No focal consolidation, pleural effusion, pneumothorax.  The cardiac silhouette is within normal limits. There is atherosclerotic calcification of the aortic arch. No acute osseous pathology. Partially visualized left shoulder arthroplasty. IMPRESSION: No active cardiopulmonary disease. Electronically Signed   By: Anner Crete M.D.   On: 09/18/2019 16:11    ____________________________________________  PROCEDURES   Procedure(s) performed (including Critical Care):  Procedures  ____________________________________________  INITIAL IMPRESSION / MDM / [place] / ED COURSE  As part of my medical decision making, I reviewed the following data within the [facility] notes reviewed and incorporated, Old chart reviewed, Notes from prior ED visits, and [place] Controlled Substance Database       *AOIBHEANN HEESCH was evaluated in Emergency Department on 09/18/2019 for the symptoms described in the history of present illness. She was evaluated in the context of the global COVID-19 pandemic, which necessitated consideration that the patient might be at risk for infection with the SARS-CoV-2 virus that causes COVID-19. Institutional protocols and algorithms that pertain to the evaluation of patients at risk for COVID-19 are in a state of rapid change based on information released by regulatory bodies including the CDC and federal and state organizations. These policies and algorithms were followed during the patient's care in the ED.  Some ED evaluations and interventions may be delayed as a result of limited staffing during the pandemic.*     Medical Decision Making:  Very pleasant, well appearing female here with hypertension, now improved after taking clonidine at home. Suspect acute on chronic essential HTN worsened in setting of bronchitis/URI and OTC cough medications. No CP, EKG nonischemic, trop neg - doubt ACS or cardiac ischemia. She is AOx4 with no focal neuro deficits, doubt CVA. No other symptoms. Her labs show  chronic hyponatremia but are otherwise unremarkable.  Reviewed pt's labs, prior PCP and hospital notes.  Seems she has a very difficult to control history of  hypertension.  There could also be a component of underlying anxiety contributing to this.  Currently, I am hesitant to adjust her daily medications and will have her call her PCP for this.  Given that she is already taking clonidine more than prescribed, which is a low-dose of 0.1 mg 3 times daily, I think the easiest step would be to put in place a dedicated plan for taking as needed medication, with notification of her PCP or presentation to the ED if her blood pressure remains elevated or she is symptomatic after the first additional dose.  She seems to tolerate this very well.  She understands that this is not an ideal long-term situation and she will need to call her doctor.  Regarding her cough, this also seems to be an ongoing issue.  I suspect she does have some underlying COPD but I am hesitant to start on steroids given her significant hypertension and normal work of breathing with no hypoxia.  Will give azithromycin given her sputum production, Tessalon.  She has an outpatient Covid test pending per report.  ____________________________________________  FINAL CLINICAL IMPRESSION(S) / ED DIAGNOSES  Final diagnoses:  Essential hypertension  Bronchitis     MEDICATIONS GIVEN DURING THIS VISIT:  Medications  sodium chloride flush (NS) 0.9 % injection 3 mL (3 mLs Intravenous Not Given 09/18/19 1711)     ED Discharge Orders         Ordered    benzonatate (TESSALON PERLES) 100 MG capsule  3 times daily PRN     09/18/19 1745    azithromycin (ZITHROMAX Z-PAK) 250 MG tablet     09/18/19 1745           Note:  This document was prepared using Dragon voice recognition software and may include unintentional dictation errors.   Duffy Bruce, MD 09/18/19 1754

## 2019-09-18 NOTE — ED Triage Notes (Signed)
First Nurse Note:  Arrives to ED c/o elevated blood pressure today.  States has taken several additional clonopin tablets today.  Also C/O URI symptoms.

## 2019-09-18 NOTE — ED Notes (Signed)
To x-ray

## 2019-09-18 NOTE — ED Notes (Signed)
Pt signed discharge papers and papers are being sent to records.

## 2019-09-21 ENCOUNTER — Other Ambulatory Visit: Payer: Self-pay | Admitting: Internal Medicine

## 2019-09-21 DIAGNOSIS — K7460 Unspecified cirrhosis of liver: Secondary | ICD-10-CM

## 2019-09-25 MED ORDER — MECLIZINE HCL 12.5 MG PO TABS
12.50 | ORAL_TABLET | ORAL | Status: DC
Start: ? — End: 2019-09-25

## 2019-09-25 MED ORDER — POLYETHYLENE GLYCOL 3350 17 GM/SCOOP PO POWD
17.00 | ORAL | Status: DC
Start: 2019-09-25 — End: 2019-09-25

## 2019-09-25 MED ORDER — ACETAMINOPHEN 500 MG PO TABS
500.00 | ORAL_TABLET | ORAL | Status: DC
Start: ? — End: 2019-09-25

## 2019-09-25 MED ORDER — MELATONIN 3 MG PO TABS
6.00 | ORAL_TABLET | ORAL | Status: DC
Start: 2019-09-27 — End: 2019-09-25

## 2019-09-25 MED ORDER — ACETAMINOPHEN 500 MG PO TABS
1000.00 | ORAL_TABLET | ORAL | Status: DC
Start: 2019-09-27 — End: 2019-09-25

## 2019-09-25 MED ORDER — ASPIRIN 81 MG PO CHEW
81.00 | CHEWABLE_TABLET | ORAL | Status: DC
Start: 2019-09-28 — End: 2019-09-25

## 2019-09-25 MED ORDER — LEVOTHYROXINE SODIUM 50 MCG PO TABS
50.00 | ORAL_TABLET | ORAL | Status: DC
Start: 2019-09-28 — End: 2019-09-25

## 2019-09-25 MED ORDER — LIDOCAINE 5 % EX PTCH
2.00 | MEDICATED_PATCH | CUTANEOUS | Status: DC
Start: 2019-09-28 — End: 2019-09-25

## 2019-09-25 MED ORDER — FLUTICASONE PROPIONATE 50 MCG/ACT NA SUSP
2.00 | NASAL | Status: DC
Start: 2019-09-28 — End: 2019-09-25

## 2019-09-25 MED ORDER — DIPHENHYDRAMINE HCL 25 MG PO CAPS
25.00 | ORAL_CAPSULE | ORAL | Status: DC
Start: ? — End: 2019-09-25

## 2019-09-25 MED ORDER — ATORVASTATIN CALCIUM 20 MG PO TABS
20.00 | ORAL_TABLET | ORAL | Status: DC
Start: 2019-09-25 — End: 2019-09-25

## 2019-09-25 MED ORDER — ENOXAPARIN SODIUM 40 MG/0.4ML ~~LOC~~ SOLN
40.00 | SUBCUTANEOUS | Status: DC
Start: 2019-09-28 — End: 2019-09-25

## 2019-09-25 MED ORDER — ALBUTEROL SULFATE (2.5 MG/3ML) 0.083% IN NEBU
2.50 | INHALATION_SOLUTION | RESPIRATORY_TRACT | Status: DC
Start: ? — End: 2019-09-25

## 2019-09-25 MED ORDER — GENERIC EXTERNAL MEDICATION
Status: DC
Start: ? — End: 2019-09-25

## 2019-09-28 ENCOUNTER — Other Ambulatory Visit: Payer: Self-pay

## 2019-09-28 ENCOUNTER — Encounter: Payer: Self-pay | Admitting: Emergency Medicine

## 2019-09-28 ENCOUNTER — Emergency Department
Admission: EM | Admit: 2019-09-28 | Discharge: 2019-09-28 | Disposition: A | Discharge status: Home / Self Care | Payer: Medicare Other | Attending: Student in an Organized Health Care Education/Training Program | Admitting: Student in an Organized Health Care Education/Training Program

## 2019-09-28 DIAGNOSIS — J45909 Unspecified asthma, uncomplicated: Secondary | ICD-10-CM | POA: Diagnosis not present

## 2019-09-28 DIAGNOSIS — K921 Melena: Secondary | ICD-10-CM | POA: Diagnosis present

## 2019-09-28 DIAGNOSIS — K649 Unspecified hemorrhoids: Secondary | ICD-10-CM | POA: Insufficient documentation

## 2019-09-28 DIAGNOSIS — R197 Diarrhea, unspecified: Secondary | ICD-10-CM | POA: Insufficient documentation

## 2019-09-28 DIAGNOSIS — I10 Essential (primary) hypertension: Secondary | ICD-10-CM | POA: Insufficient documentation

## 2019-09-28 DIAGNOSIS — Z79899 Other long term (current) drug therapy: Secondary | ICD-10-CM | POA: Diagnosis not present

## 2019-09-28 DIAGNOSIS — E039 Hypothyroidism, unspecified: Secondary | ICD-10-CM | POA: Insufficient documentation

## 2019-09-28 DIAGNOSIS — Z853 Personal history of malignant neoplasm of breast: Secondary | ICD-10-CM | POA: Insufficient documentation

## 2019-09-28 DIAGNOSIS — F419 Anxiety disorder, unspecified: Secondary | ICD-10-CM | POA: Insufficient documentation

## 2019-09-28 LAB — COMPREHENSIVE METABOLIC PANEL
ALT: 17 U/L (ref 0–44)
AST: 27 U/L (ref 15–41)
Albumin: 4.2 g/dL (ref 3.5–5.0)
Alkaline Phosphatase: 40 U/L (ref 38–126)
Anion gap: 12 (ref 5–15)
BUN: 14 mg/dL (ref 8–23)
CO2: 26 mmol/L (ref 22–32)
Calcium: 9.7 mg/dL (ref 8.9–10.3)
Chloride: 95 mmol/L — ABNORMAL LOW (ref 98–111)
Creatinine, Ser: 0.93 mg/dL (ref 0.44–1.00)
GFR calc Af Amer: >60 mL/min (ref >60)
GFR calc non Af Amer: 56 mL/min — ABNORMAL LOW (ref >60)
Glucose, Bld: 117 mg/dL — ABNORMAL HIGH (ref 70–99)
Potassium: 3 mmol/L — ABNORMAL LOW (ref 3.5–5.1)
Sodium: 133 mmol/L — ABNORMAL LOW (ref 135–145)
Total Bilirubin: 0.9 mg/dL (ref 0.3–1.2)
Total Protein: 7.3 g/dL (ref 6.5–8.1)

## 2019-09-28 LAB — TYPE AND SCREEN
ABO/RH(D): A POS
Antibody Screen: NEGATIVE

## 2019-09-28 LAB — CBC WITH DIFFERENTIAL/PLATELET
Abs Immature Granulocytes: 0.06 10*3/uL (ref 0.00–0.07)
Basophils Absolute: 0.1 10*3/uL (ref 0.0–0.1)
Basophils Relative: 1 %
Eosinophils Absolute: 0.3 10*3/uL (ref 0.0–0.5)
Eosinophils Relative: 4 %
HCT: 39.8 % (ref 36.0–46.0)
Hemoglobin: 14 g/dL (ref 12.0–15.0)
Immature Granulocytes: 1 %
Lymphocytes Relative: 18 %
Lymphs Abs: 1.2 10*3/uL (ref 0.7–4.0)
MCH: 31.6 pg (ref 26.0–34.0)
MCHC: 35.2 g/dL (ref 30.0–36.0)
MCV: 89.8 fL (ref 80.0–100.0)
Monocytes Absolute: 0.9 10*3/uL (ref 0.1–1.0)
Monocytes Relative: 13 %
Neutro Abs: 4.5 10*3/uL (ref 1.7–7.7)
Neutrophils Relative %: 63 %
Platelets: 243 10*3/uL (ref 150–400)
RBC: 4.43 MIL/uL (ref 3.87–5.11)
RDW: 13.3 % (ref 11.5–15.5)
WBC: 7 10*3/uL (ref 4.0–10.5)
nRBC: 0 % (ref 0.0–0.2)

## 2019-09-28 MED ORDER — ALPRAZOLAM 0.5 MG PO TABS
0.5000 mg | ORAL_TABLET | Freq: Once | ORAL | Status: AC
  Administered 2019-09-28: 0.5 mg via ORAL
  Filled 2019-09-28: qty 1

## 2019-09-28 MED ORDER — METOPROLOL TARTRATE 25 MG PO TABS
25.00 | ORAL_TABLET | ORAL | Status: DC
Start: 2019-09-27 — End: 2019-09-28

## 2019-09-28 MED ORDER — POTASSIUM CHLORIDE CRYS ER 20 MEQ PO TBCR
40.0000 meq | EXTENDED_RELEASE_TABLET | Freq: Once | ORAL | Status: AC
  Administered 2019-09-28: 40 meq via ORAL
  Filled 2019-09-28: qty 2

## 2019-09-28 MED ORDER — FAMOTIDINE 20 MG PO TABS
20.00 | ORAL_TABLET | ORAL | Status: DC
Start: 2019-09-27 — End: 2019-09-28

## 2019-09-28 MED ORDER — GENERIC EXTERNAL MEDICATION
Status: DC
Start: ? — End: 2019-09-28

## 2019-09-28 MED ORDER — LOSARTAN POTASSIUM 100 MG PO TABS
100.00 | ORAL_TABLET | ORAL | Status: DC
Start: 2019-09-27 — End: 2019-09-28

## 2019-09-28 MED ORDER — HYDRALAZINE HCL 50 MG PO TABS
50.00 | ORAL_TABLET | ORAL | Status: DC
Start: 2019-09-27 — End: 2019-09-28

## 2019-09-28 MED ORDER — POLYETHYLENE GLYCOL 3350 17 GM/SCOOP PO POWD
17.00 | ORAL | Status: DC
Start: ? — End: 2019-09-28

## 2019-09-28 MED ORDER — SENNOSIDES 8.6 MG PO TABS
1.00 | ORAL_TABLET | ORAL | Status: DC
Start: ? — End: 2019-09-28

## 2019-09-28 MED ORDER — ALPRAZOLAM 0.5 MG PO TABS
0.25 | ORAL_TABLET | ORAL | Status: DC
Start: ? — End: 2019-09-28

## 2019-09-28 MED ORDER — AMLODIPINE BESYLATE 10 MG PO TABS
10.00 | ORAL_TABLET | ORAL | Status: DC
Start: 2019-09-28 — End: 2019-09-28

## 2019-09-28 MED ORDER — BUSPIRONE HCL 5 MG PO TABS
10.00 | ORAL_TABLET | ORAL | Status: DC
Start: 2019-09-27 — End: 2019-09-28

## 2019-09-28 MED ORDER — SIMETHICONE 80 MG PO CHEW
80.00 | CHEWABLE_TABLET | ORAL | Status: DC
Start: ? — End: 2019-09-28

## 2019-09-28 MED ORDER — ONDANSETRON 4 MG PO TBDP
4.00 | ORAL_TABLET | ORAL | Status: DC
Start: ? — End: 2019-09-28

## 2019-09-28 MED ORDER — ATORVASTATIN CALCIUM 40 MG PO TABS
40.00 | ORAL_TABLET | ORAL | Status: DC
Start: 2019-09-27 — End: 2019-09-28

## 2019-09-28 NOTE — ED Provider Notes (Signed)
[facility] Emergency Department Provider Note    First MD Initiated Contact with Patient 09/28/19 [phone]     (approximate)  I have reviewed the triage vital signs and the nursing notes.   HISTORY  Chief Complaint Diarrhea and Rectal Bleeding    HPI Janice Perkins is a 86 y.o. female significant past medical history presents to the ER for evaluation of blood that she noted after wiping this morning states that she has been struggling with constipation for the past several days and straining to use the bathroom.  Took MiraLAX.  Woke up this morning and had loose formed stool in her diaper.  She went to the bathroom to clean up and then was wiping and noted some blood on the toilet paper.  States the stool was brown.  No blood in the stool or melena.  She was previously on anticoagulation but that was discontinued due to history of gastric ulcer.  She denies any chest pain or weakness.  Has not been on any recent antibiotics.  She states that she does feel very anxious right now and did not take her morning Xanax.    Endoscopy 2020: - Blood in oropharynx - Clotted blood at the cricopharyngeus. - LA Grade C reflux esophagitis. - Clotted blood in the gastric body. - Non-bleeding duodenal ulcer with no stigmata of bleeding. - No specimens collected.  Colonoscopy 2020: Non-bleeding internal hemorrhoids. - No specimens collected. - The patient most likely had bleed from an ENT source.  Past Medical History:  Diagnosis Date  . Asthma   . Atrial fibrillation ([place])   . Breast cancer ([place]) 2011  . Bronchitis 04/2015  . Cancer [facility]) 2011   breast  . COPD (chronic obstructive pulmonary disease) ([place])   . Hypertension   . Shingles    10/2015   Family History  Problem Relation Age of Onset  . Hypertension Mother   . Heart disease Mother   . Cancer Mother   . Stroke Father   . Hypertension Father   . Breast cancer Sister    Past Surgical History:    Procedure Laterality Date  . BREAST EXCISIONAL BIOPSY Right 11/29/13   two areas FAT NECROSIS  . BREAST EXCISIONAL BIOPSY Right 10/30/2009   lumpectomy - radiation  . COLONOSCOPY WITH PROPOFOL N/A 06/10/2018   Procedure: COLONOSCOPY WITH PROPOFOL;  Surgeon: Manya Silvas, MD;  Location: [facility] ENDOSCOPY;  Service: Endoscopy;  Laterality: N/A;  . COLONOSCOPY WITH PROPOFOL N/A 02/20/2019   Procedure: COLONOSCOPY WITH PROPOFOL;  Surgeon: Lucilla Lame, MD;  Location[facility] ENDOSCOPY;  Service: Endoscopy;  Laterality: N/A;  . ESOPHAGOGASTRODUODENOSCOPY (EGD) WITH PROPOFOL N/A 06/10/2018   Procedure: ESOPHAGOGASTRODUODENOSCOPY (EGD) WITH PROPOFOL;  Surgeon: Manya Silvas, MD;  Location: [facility] ENDOSCOPY;  Service: Endoscopy;  Laterality: N/A;  . ESOPHAGOGASTRODUODENOSCOPY (EGD) WITH PROPOFOL N/A 02/20/2019   Procedure: ESOPHAGOGASTRODUODENOSCOPY (EGD) WITH PROPOFOL;  Surgeon: Lucilla Lame, MD;  Location: [facility] ENDOSCOPY;  Service: Endoscopy;  Laterality: N/A;  . NASAL SINUS SURGERY     Patient Active Problem List   Diagnosis Date Noted  . Acute encephalopathy 08/07/2019  . Fluid overload 06/09/2019  . Bronchitis 05/31/2019  . Hypothyroidism 05/31/2019  . Duodenum ulcer   . Melena   . GI bleed 02/17/2019  . Hypertensive encephalopathy 12/11/2018  . Hypoxia 05/30/2018  . Leukocytosis 04/05/2018  . Pneumonia 11/15/2017  . Pleural effusion 05/16/2017  . AF (paroxysmal atrial fibrillation) ([place]) 05/16/2017  . Breast cancer ([place]) 05/16/2017  . Dependence  on nocturnal oxygen therapy 05/16/2017  . HTN (hypertension) 05/16/2017  . Generalized weakness 03/07/2017  . Hyponatremia 03/07/2017  . Dehydration 03/07/2017  . UTI (urinary tract infection) 03/07/2017  . HCAP (healthcare-associated pneumonia) 01/19/2017  . AKI (acute kidney injury) ([place]) 12/18/2016  . Primary cancer of upper outer quadrant of right female breast ([place]) 04/07/2016  . Lumbar radiculopathy 03/23/2016  . Spinal stenosis,  lumbar region, with neurogenic claudication 03/23/2016  . DDD (degenerative disc disease), lumbar 01/14/2015  . Lumbosacral facet joint syndrome 01/14/2015  . Sacroiliac joint dysfunction 01/14/2015  . Greater trochanteric bursitis 01/14/2015  . Bilateral occipital neuralgia 01/14/2015      Prior to Admission medications   Medication Sig Start Date End Date Taking? Authorizing Provider  albuterol (ACCUNEB) 1.25 MG/3ML nebulizer solution Inhale 3 mLs into the lungs every 6 (six) hours as needed for wheezing.     [provider]  albuterol (PROVENTIL HFA;VENTOLIN HFA) 108 (90 Base) MCG/ACT inhaler Inhale 2 puffs into the lungs every 6 (six) hours as needed for wheezing or shortness of breath.     [provider]  ALPRAZolam Duanne Moron) 0.5 MG tablet Take 0.5 mg by mouth 2 (two) times a day.     [provider]  alum & mag hydroxide-simeth (MAALOX/MYLANTA) 200-200-20 MG/5ML suspension Take 30 mLs by mouth every 4 (four) hours as needed for indigestion or heartburn. 06/08/19   Demetrios Loll, MD  atorvastatin (LIPITOR) 40 MG tablet Take 40 mg by mouth daily. 03/17/19   [provider]  azithromycin (ZITHROMAX Z-PAK) 250 MG tablet Take 2 tablets (500 mg) on  Day 1,  followed by 1 tablet (250 mg) once daily on Days 2 through 5. 09/18/19   Duffy Bruce, MD  benzonatate (TESSALON PERLES) 100 MG capsule Take 1 capsule (100 mg total) by mouth 3 (three) times daily as needed for cough. 08/25/19 08/24/20  Blake Divine, MD  benzonatate (TESSALON PERLES) 100 MG capsule Take 1 capsule (100 mg total) by mouth 3 (three) times daily as needed for cough. 09/18/19 09/17/20  Duffy Bruce, MD  busPIRone (BUSPAR) 10 MG tablet Take 10 mg by mouth 2 (two) times daily.    [provider]  cholecalciferol (VITAMIN D) 1000 units tablet Take 1,000 Units by mouth daily.    [provider]  cloNIDine (CATAPRES) 0.1 MG tablet Take 1 tablet (0.1 mg total) by mouth 3 (three) times  daily. 08/08/19   Ezekiel Slocumb, DO  diltiazem (CARDIZEM CD) 180 MG 24 hr capsule Take 1 capsule (180 mg total) by mouth daily. 08/08/19   Ezekiel Slocumb, DO  famotidine (PEPCID) 20 MG tablet Take 20 mg by mouth 2 (two) times daily. 07/29/19   [provider]  ferrous sulfate 325 (65 FE) MG tablet Take 325 mg by mouth daily with breakfast.    [provider]  Fluticasone-Salmeterol (ADVAIR) 250-50 MCG/DOSE AEPB Inhale 1 puff into the lungs 2 (two) times daily.    [provider]  furosemide (LASIX) 20 MG tablet Take 1 tablet (20 mg total) by mouth daily. 06/10/19 06/09/20  Loletha Grayer, MD  gabapentin (NEURONTIN) 300 MG capsule Take 300 mg by mouth daily.    [provider]  hydrALAZINE (APRESOLINE) 50 MG tablet Take 50 mg by mouth 3 (three) times daily. 07/10/19   [provider]  HYDROcodone-acetaminophen (NORCO/VICODIN) 5-325 MG tablet Take 1 tablet by mouth 2 (two) times daily.  04/26/19   [provider]  levothyroxine (SYNTHROID, LEVOTHROID) 50 MCG tablet  Take 50 mcg by mouth daily before breakfast.    [provider]  loratadine (CLARITIN) 10 MG tablet Take 10 mg by mouth daily.    [provider]  losartan (COZAAR) 50 MG tablet Take 2 tablets (100 mg total) by mouth at bedtime. 12/13/18   Loletha Grayer, MD  magnesium oxide (MAG-OX) 400 MG tablet Take 400 mg by mouth 2 (two) times daily.    [provider]  meloxicam (MOBIC) 15 MG tablet Take 15 mg by mouth daily. 02/07/19   [provider]  metoprolol succinate (TOPROL-XL) 25 MG 24 hr tablet Take 1 tablet (25 mg total) by mouth daily. 06/10/19   Loletha Grayer, MD  mometasone-formoterol (DULERA) 100-5 MCG/ACT AERO Inhale 2 puffs into the lungs 2 (two) times daily.    [provider]  polyethylene glycol (MIRALAX / GLYCOLAX) 17 g packet Take 17 g by mouth daily. 01/03/19   Earleen Newport, MD  RABEprazole (ACIPHEX) 20 MG tablet  Take 20 mg by mouth 2 (two) times daily.    [provider]  venlafaxine XR (EFFEXOR-XR) 75 MG 24 hr capsule Take 75 mg by mouth daily.    [provider]    Allergies Diphenhydramine hcl, Penicillins, Captopril, Eliquis [apixaban], Enalapril maleate, Tape, Terfenadine, Augmentin [amoxicillin-pot clavulanate], and Biaxin [clarithromycin]    Social History Social History   Tobacco Use  . Smoking status: Never Smoker  . Smokeless tobacco: Never Used  Substance Use Topics  . Alcohol use: No    Alcohol/week: 0.0 standard drinks  . Drug use: No    Review of Systems Patient denies headaches, rhinorrhea, blurry vision, numbness, shortness of breath, chest pain, edema, cough, abdominal pain, nausea, vomiting, diarrhea, dysuria, fevers, rashes or hallucinations unless otherwise stated above in HPI. ____________________________________________   PHYSICAL EXAM:  VITAL SIGNS: Vitals:   09/28/19 0753 09/28/19 0800  BP: (!) 178/103 140/78  Pulse: 76 69  Resp: 18 16  Temp:    SpO2: 99% 99%    Constitutional: Alert and oriented.  Eyes: Conjunctivae are normal.  Head: Atraumatic. Nose: No congestion/rhinnorhea. Mouth/Throat: Mucous membranes are moist.   Neck: No stridor. Painless ROM.  Cardiovascular: Normal rate, regular rhythm. Grossly normal heart sounds.  Good peripheral circulation. Respiratory: Normal respiratory effort.  No retractions. Lungs CTAB. Gastrointestinal: Soft and nontender. No distention. No abdominal bruits. No CVA tenderness. Genitourinary: Soft brown stool on DRE.  No blood.  Nonbleeding nonthrombosed hemorrhoid.  No perirectal fluctuance Musculoskeletal: No lower extremity tenderness nor edema.  No joint effusions. Neurologic:  Normal speech and language. No gross focal neurologic deficits are appreciated. No facial droop Skin:  Skin is warm, dry and intact. No rash noted. Psychiatric: Mood and affect are normal. Speech and behavior are  normal.  ____________________________________________   LABS (all labs ordered are listed, but only abnormal results are displayed)  Results for orders placed or performed during the hospital encounter of 09/28/19 (from the past 24 hour(s))  CBC with Differential/Platelet     Status: None   Collection Time: 09/28/19  8:11 AM  Result Value Ref Range   WBC 7.0 4.0 - 10.5 K/uL   RBC 4.43 3.87 - 5.11 MIL/uL   Hemoglobin 14.0 12.0 - 15.0 g/dL   HCT 39.8 36.0 - 46.0 %   MCV 89.8 80.0 - 100.0 fL   MCH 31.6 26.0 - 34.0 pg   MCHC 35.2 30.0 - 36.0 g/dL   RDW 13.3 11.5 - 15.5 %   Platelets 243 150 -  400 K/uL   nRBC 0.0 0.0 - 0.2 %   Neutrophils Relative % 63 %   Neutro Abs 4.5 1.7 - 7.7 K/uL   Lymphocytes Relative 18 %   Lymphs Abs 1.2 0.7 - 4.0 K/uL   Monocytes Relative 13 %   Monocytes Absolute 0.9 0.1 - 1.0 K/uL   Eosinophils Relative 4 %   Eosinophils Absolute 0.3 0.0 - 0.5 K/uL   Basophils Relative 1 %   Basophils Absolute 0.1 0.0 - 0.1 K/uL   Immature Granulocytes 1 %   Abs Immature Granulocytes 0.06 0.00 - 0.07 K/uL  Comprehensive metabolic panel     Status: Abnormal   Collection Time: 09/28/19  8:11 AM  Result Value Ref Range   Sodium 133 (L) 135 - 145 mmol/L   Potassium 3.0 (L) 3.5 - 5.1 mmol/L   Chloride 95 (L) 98 - 111 mmol/L   CO2 26 22 - 32 mmol/L   Glucose, Bld 117 (H) 70 - 99 mg/dL   BUN 14 8 - 23 mg/dL   Creatinine, Ser 0.93 0.44 - 1.00 mg/dL   Calcium 9.7 8.9 - 10.3 mg/dL   Total Protein 7.3 6.5 - 8.1 g/dL   Albumin 4.2 3.5 - 5.0 g/dL   AST 27 15 - 41 U/L   ALT 17 0 - 44 U/L   Alkaline Phosphatase 40 38 - 126 U/L   Total Bilirubin 0.9 0.3 - 1.2 mg/dL   GFR calc non Af Amer 56 (L) >60 mL/min   GFR calc Af Amer >60 >60 mL/min   Anion gap 12 5 - 15  Type and screen [facility]     Status: None (Preliminary result)   Collection Time: 09/28/19  8:11 AM  Result Value Ref Range   ABO/RH(D) PENDING    Antibody Screen PENDING    Sample  Expiration      10/01/2019,2359 Performed at [facility], [address] [place], [place] [address]    ____________________________________________  EKG ____________________________________________  RADIOLOGY   ____________________________________________   PROCEDURES  Procedure(s) performed:  Procedures    Critical Care performed: no ____________________________________________   INITIAL IMPRESSION / ASSESSMENT AND PLAN / ED COURSE  Pertinent labs & imaging results that were available during my care of the patient were reviewed by me and considered in my medical decision making (see chart for details).   DDX: hemorrhoid, anal fissure, infection diarrhea, colitis, mass, UGIB, LGIB  TEINA CAMPANA is a 86 y.o. who presents to the ED with symptoms as described above.  She arrives hemodynamically stable.  She is mildly anxious.  We will give her her morning Xanax prescription.  Her exam is reassuring.  There is no evidence of melena or hematochezia on exam.  Given the history I suspect she had probable small tear anal fissure causing some bleeding from her recent constipation may be caused worsening irritation after large bowel movement this morning.  Will check blood work to ensure there is no evidence of drop in her hemoglobin.  This does not seem consistent with upper GI bleed or lower GI bleed.  Clinical Course as of Sep 27 850  Thu Sep 28, 2019  [id number] Patient's not having any persistent diarrhea she is only had one episode of loose bowel.  Blood work is otherwise reassuring.  Mildly low potassium was given repletion.  She can follow-up with her PCP regarding repeat blood work.  Reportedly her hemoglobin is 14.  In the absence of melena hematochezia or  evidence of bleeding I do not feel that further work-up clinically indicated at this time.  She stable and appropriate for outpatient follow-up.   [PR]    Clinical Course User Index [PR] Merlyn Lot, MD     The patient was evaluated in Emergency Department today for the symptoms described in the history of present illness. He/she was evaluated in the context of the global COVID-19 pandemic, which necessitated consideration that the patient might be at risk for infection with the SARS-CoV-2 virus that causes COVID-19. Institutional protocols and algorithms that pertain to the evaluation of patients at risk for COVID-19 are in a state of rapid change based on information released by regulatory bodies including the CDC and federal and state organizations. These policies and algorithms were followed during the patient's care in the ED.  As part of my medical decision making, I reviewed the following data within the [place] notes reviewed and incorporated, Labs reviewed, notes from prior ED visits and [place] Controlled Substance Database   ____________________________________________   FINAL CLINICAL IMPRESSION(S) / ED DIAGNOSES  Final diagnoses:  Hemorrhoids, unspecified hemorrhoid type      NEW MEDICATIONS STARTED DURING THIS VISIT:  New Prescriptions   No medications on file     Note:  This document was prepared using Dragon voice recognition software and may include unintentional dictation errors.    Merlyn Lot, MD 09/28/19 [phone]

## 2019-09-28 NOTE — ED Notes (Signed)
Patient ambulated to room commode with a steady gait. 

## 2019-09-28 NOTE — ED Notes (Signed)
Patient c/o feeling anxious. Patient states she takes Xanax at home. Dr. Quentin Cornwall aware.

## 2019-09-28 NOTE — ED Triage Notes (Signed)
Patient to ER for c/o diarrhea (x1 episode) and rectal bleeding. Patient states she was constipated the entire time she was in the hospital recently and now has "hemorrhoid sticking out" that she believes is where the bleeding is coming from. Patient was hospitalized for confusion. Denies receiving antibiotics while admitted.

## 2019-10-03 ENCOUNTER — Other Ambulatory Visit: Payer: Self-pay

## 2019-10-03 ENCOUNTER — Ambulatory Visit
Admission: RE | Admit: 2019-10-03 | Discharge: 2019-10-03 | Disposition: A | Discharge status: Home / Self Care | Payer: Medicare Other | Source: Ambulatory Visit | Attending: Internal Medicine | Admitting: Internal Medicine

## 2019-10-03 DIAGNOSIS — K7460 Unspecified cirrhosis of liver: Secondary | ICD-10-CM

## 2019-10-12 ENCOUNTER — Other Ambulatory Visit: Payer: Self-pay

## 2019-10-12 ENCOUNTER — Encounter: Payer: Self-pay | Admitting: Emergency Medicine

## 2019-10-12 ENCOUNTER — Emergency Department
Admission: EM | Admit: 2019-10-12 | Discharge: 2019-10-12 | Disposition: A | Discharge status: Home / Self Care | Payer: Medicare Other | Attending: Emergency Medicine | Admitting: Emergency Medicine

## 2019-10-12 DIAGNOSIS — I10 Essential (primary) hypertension: Secondary | ICD-10-CM | POA: Diagnosis not present

## 2019-10-12 DIAGNOSIS — E039 Hypothyroidism, unspecified: Secondary | ICD-10-CM | POA: Insufficient documentation

## 2019-10-12 DIAGNOSIS — R600 Localized edema: Secondary | ICD-10-CM | POA: Diagnosis not present

## 2019-10-12 DIAGNOSIS — R2240 Localized swelling, mass and lump, unspecified lower limb: Secondary | ICD-10-CM | POA: Diagnosis present

## 2019-10-12 DIAGNOSIS — J45909 Unspecified asthma, uncomplicated: Secondary | ICD-10-CM | POA: Insufficient documentation

## 2019-10-12 DIAGNOSIS — J449 Chronic obstructive pulmonary disease, unspecified: Secondary | ICD-10-CM | POA: Diagnosis not present

## 2019-10-12 DIAGNOSIS — R609 Edema, unspecified: Secondary | ICD-10-CM

## 2019-10-12 DIAGNOSIS — Z79899 Other long term (current) drug therapy: Secondary | ICD-10-CM | POA: Diagnosis not present

## 2019-10-12 LAB — COMPREHENSIVE METABOLIC PANEL
ALT: 17 U/L (ref 0–44)
AST: 24 U/L (ref 15–41)
Albumin: 4.1 g/dL (ref 3.5–5.0)
Alkaline Phosphatase: 40 U/L (ref 38–126)
Anion gap: 9 (ref 5–15)
BUN: 9 mg/dL (ref 8–23)
CO2: 28 mmol/L (ref 22–32)
Calcium: 9.3 mg/dL (ref 8.9–10.3)
Chloride: 96 mmol/L — ABNORMAL LOW (ref 98–111)
Creatinine, Ser: 0.88 mg/dL (ref 0.44–1.00)
GFR calc Af Amer: >60 mL/min (ref >60)
GFR calc non Af Amer: 60 mL/min — ABNORMAL LOW (ref >60)
Glucose, Bld: 122 mg/dL — ABNORMAL HIGH (ref 70–99)
Potassium: 4.7 mmol/L (ref 3.5–5.1)
Sodium: 133 mmol/L — ABNORMAL LOW (ref 135–145)
Total Bilirubin: 1.1 mg/dL (ref 0.3–1.2)
Total Protein: 7 g/dL (ref 6.5–8.1)

## 2019-10-12 LAB — CBC
HCT: 36.2 % (ref 36.0–46.0)
Hemoglobin: 12.5 g/dL (ref 12.0–15.0)
MCH: 32 pg (ref 26.0–34.0)
MCHC: 34.5 g/dL (ref 30.0–36.0)
MCV: 92.6 fL (ref 80.0–100.0)
Platelets: 202 10*3/uL (ref 150–400)
RBC: 3.91 MIL/uL (ref 3.87–5.11)
RDW: 13.2 % (ref 11.5–15.5)
WBC: 5.4 10*3/uL (ref 4.0–10.5)
nRBC: 0 % (ref 0.0–0.2)

## 2019-10-12 LAB — BRAIN NATRIURETIC PEPTIDE: B Natriuretic Peptide: 251 pg/mL — ABNORMAL HIGH (ref 0.0–100.0)

## 2019-10-12 LAB — TROPONIN I (HIGH SENSITIVITY): Troponin I (High Sensitivity): 9 ng/L (ref <18)

## 2019-10-12 NOTE — ED Notes (Signed)
Patient given warm blanket.

## 2019-10-12 NOTE — ED Provider Notes (Signed)
[facility] Emergency Department Provider Note  Time seen: 8:05 AM  I have reviewed the triage vital signs and the nursing notes.   HISTORY  Chief Complaint Leg Swelling   HPI Janice Perkins is a 86 y.o. female with a past medical history of asthma, atrial fibrillation, COPD, hypertension, presents to the emergency department for lower extremity swelling.  According to the patient she is noted over the past couple days some swelling in her feet and been experiencing intermittent tingling in her bilateral hands.  Patient went to her cardiologist yesterday Dr. Ubaldo Glassing who changed her medications.  Patient states she continued to have some swelling in her feet this morning so she came to the emergency department for evaluation.  Denies any shortness of breath or chest pain.  States she was having some abdominal discomfort last night but then had a bowel movement and it went away.  Denies any abdominal pain, nausea vomiting or diarrhea.  Denies any recent fever cough.   Past Medical History:  Diagnosis Date  . Asthma   . Atrial fibrillation ([place])   . Breast cancer ([place]) 2011  . Bronchitis 04/2015  . Cancer [facility]) 2011   breast  . COPD (chronic obstructive pulmonary disease) ([place])   . Hypertension   . Shingles    10/2015    Patient Active Problem List   Diagnosis Date Noted  . Acute encephalopathy 08/07/2019  . Fluid overload 06/09/2019  . Bronchitis 05/31/2019  . Hypothyroidism 05/31/2019  . Duodenum ulcer   . Melena   . GI bleed 02/17/2019  . Hypertensive encephalopathy 12/11/2018  . Hypoxia 05/30/2018  . Leukocytosis 04/05/2018  . Pneumonia 11/15/2017  . Pleural effusion 05/16/2017  . AF (paroxysmal atrial fibrillation) ([place]) 05/16/2017  . Breast cancer ([place]) 05/16/2017  . Dependence on nocturnal oxygen therapy 05/16/2017  . HTN (hypertension) 05/16/2017  . Generalized weakness 03/07/2017  . Hyponatremia 03/07/2017  . Dehydration 03/07/2017  . UTI  (urinary tract infection) 03/07/2017  . HCAP (healthcare-associated pneumonia) 01/19/2017  . AKI (acute kidney injury) ([place]) 12/18/2016  . Primary cancer of upper outer quadrant of right female breast ([place]) 04/07/2016  . Lumbar radiculopathy 03/23/2016  . Spinal stenosis, lumbar region, with neurogenic claudication 03/23/2016  . DDD (degenerative disc disease), lumbar 01/14/2015  . Lumbosacral facet joint syndrome 01/14/2015  . Sacroiliac joint dysfunction 01/14/2015  . Greater trochanteric bursitis 01/14/2015  . Bilateral occipital neuralgia 01/14/2015    Past Surgical History:  Procedure Laterality Date  . BREAST EXCISIONAL BIOPSY Right 11/29/13   two areas FAT NECROSIS  . BREAST EXCISIONAL BIOPSY Right 10/30/2009   lumpectomy - radiation  . COLONOSCOPY WITH PROPOFOL N/A 06/10/2018   Procedure: COLONOSCOPY WITH PROPOFOL;  Surgeon: Manya Silvas, MD;  Location: [facility] ENDOSCOPY;  Service: Endoscopy;  Laterality: N/A;  . COLONOSCOPY WITH PROPOFOL N/A 02/20/2019   Procedure: COLONOSCOPY WITH PROPOFOL;  Surgeon: Lucilla Lame, MD;  Location: [facility] ENDOSCOPY;  Service: Endoscopy;  Laterality: N/A;  . ESOPHAGOGASTRODUODENOSCOPY (EGD) WITH PROPOFOL N/A 06/10/2018   Procedure: ESOPHAGOGASTRODUODENOSCOPY (EGD) WITH PROPOFOL;  Surgeon: Manya Silvas, MD;  Location: [facility] ENDOSCOPY;  Service: Endoscopy;  Laterality: N/A;  . ESOPHAGOGASTRODUODENOSCOPY (EGD) WITH PROPOFOL N/A 02/20/2019   Procedure: ESOPHAGOGASTRODUODENOSCOPY (EGD) WITH PROPOFOL;  Surgeon: Lucilla Lame, MD;  Location: [facility] ENDOSCOPY;  Service: Endoscopy;  Laterality: N/A;  . NASAL SINUS SURGERY      Prior to Admission medications   Medication Sig Start Date End Date Taking? Authorizing Provider  albuterol (ACCUNEB) 1.25 MG/3ML  nebulizer solution Inhale 3 mLs into the lungs every 6 (six) hours as needed for wheezing.     [provider]  albuterol (PROVENTIL HFA;VENTOLIN HFA) 108 (90 Base) MCG/ACT inhaler Inhale 2 puffs  into the lungs every 6 (six) hours as needed for wheezing or shortness of breath.     [provider]  ALPRAZolam Duanne Moron) 0.5 MG tablet Take 0.5 mg by mouth 2 (two) times a day.     [provider]  alum & mag hydroxide-simeth (MAALOX/MYLANTA) 200-200-20 MG/5ML suspension Take 30 mLs by mouth every 4 (four) hours as needed for indigestion or heartburn. 06/08/19   Demetrios Loll, MD  atorvastatin (LIPITOR) 40 MG tablet Take 40 mg by mouth daily. 03/17/19   [provider]  azithromycin (ZITHROMAX Z-PAK) 250 MG tablet Take 2 tablets (500 mg) on  Day 1,  followed by 1 tablet (250 mg) once daily on Days 2 through 5. 09/18/19   Duffy Bruce, MD  benzonatate (TESSALON PERLES) 100 MG capsule Take 1 capsule (100 mg total) by mouth 3 (three) times daily as needed for cough. 08/25/19 08/24/20  Blake Divine, MD  benzonatate (TESSALON PERLES) 100 MG capsule Take 1 capsule (100 mg total) by mouth 3 (three) times daily as needed for cough. 09/18/19 09/17/20  Duffy Bruce, MD  busPIRone (BUSPAR) 10 MG tablet Take 10 mg by mouth 2 (two) times daily.    [provider]  cholecalciferol (VITAMIN D) 1000 units tablet Take 1,000 Units by mouth daily.    [provider]  cloNIDine (CATAPRES) 0.1 MG tablet Take 1 tablet (0.1 mg total) by mouth 3 (three) times daily. 08/08/19   Ezekiel Slocumb, DO  diltiazem (CARDIZEM CD) 180 MG 24 hr capsule Take 1 capsule (180 mg total) by mouth daily. 08/08/19   Ezekiel Slocumb, DO  famotidine (PEPCID) 20 MG tablet Take 20 mg by mouth 2 (two) times daily. 07/29/19   [provider]  ferrous sulfate 325 (65 FE) MG tablet Take 325 mg by mouth daily with breakfast.    [provider]  Fluticasone-Salmeterol (ADVAIR) 250-50 MCG/DOSE AEPB Inhale 1 puff into the lungs 2 (two) times daily.    [provider]  furosemide (LASIX) 20 MG tablet Take 1 tablet (20 mg total) by mouth daily. 06/10/19 06/09/20  Loletha Grayer, MD   gabapentin (NEURONTIN) 300 MG capsule Take 300 mg by mouth daily.    [provider]  hydrALAZINE (APRESOLINE) 50 MG tablet Take 50 mg by mouth 3 (three) times daily. 07/10/19   [provider]  HYDROcodone-acetaminophen (NORCO/VICODIN) 5-325 MG tablet Take 1 tablet by mouth 2 (two) times daily.  04/26/19   [provider]  levothyroxine (SYNTHROID, LEVOTHROID) 50 MCG tablet Take 50 mcg by mouth daily before breakfast.    [provider]  loratadine (CLARITIN) 10 MG tablet Take 10 mg by mouth daily.    [provider]  losartan (COZAAR) 50 MG tablet Take 2 tablets (100 mg total) by mouth at bedtime. 12/13/18   Loletha Grayer, MD  magnesium oxide (MAG-OX) 400 MG tablet Take 400 mg by mouth 2 (two) times daily.    [provider]  meloxicam (MOBIC) 15 MG tablet Take 15 mg by mouth daily. 02/07/19   [provider]  metoprolol succinate (TOPROL-XL) 25 MG 24 hr tablet Take 1 tablet (25 mg total) by mouth daily. 06/10/19   Wieting, Richard, MD  mometasone-formoterol (DULERA) 100-5 MCG/ACT AERO Inhale 2 puffs into the lungs 2 (  two) times daily.    [provider]  polyethylene glycol (MIRALAX / GLYCOLAX) 17 g packet Take 17 g by mouth daily. 01/03/19   Earleen Newport, MD  RABEprazole (ACIPHEX) 20 MG tablet Take 20 mg by mouth 2 (two) times daily.    [provider]  venlafaxine XR (EFFEXOR-XR) 75 MG 24 hr capsule Take 75 mg by mouth daily.    [provider]    Allergies  Allergen Reactions  . Diphenhydramine Hcl Rash  . Penicillins Hives    .Has patient had a PCN reaction causing immediate rash, facial/tongue/throat swelling, SOB or lightheadedness with hypotension: Unknown Has patient had a PCN reaction causing severe rash involving mucus membranes or skin necrosis: Unknown Has patient had a PCN reaction that required hospitalization: Unknown Has patient had a PCN reaction occurring within the last 10  years: Unknown If all of the above answers are "NO", then may proceed with Cephalosporin use.   . Captopril Other (See Comments)  . Eliquis [Apixaban] Other (See Comments)    Stomach bleed  . Enalapril Maleate Other (See Comments)    Other reaction(s): Unknown  . Tape Itching  . Terfenadine Other (See Comments)  . Augmentin [Amoxicillin-Pot Clavulanate] Rash    Has patient had a PCN reaction causing immediate rash, facial/tongue/throat swelling, SOB or lightheadedness with hypotension: Unknown Has patient had a PCN reaction causing severe rash involving mucus membranes or skin necrosis: Unknown Has patient had a PCN reaction that required hospitalization: Unknown Has patient had a PCN reaction occurring within the last 10 years: Unknown If all of the above answers are "NO", then may proceed with Cephalosporin use.  . Biaxin [Clarithromycin] Rash, Other (See Comments) and Hives    Other reaction(s): Unknown    Family History  Problem Relation Age of Onset  . Hypertension Mother   . Heart disease Mother   . Cancer Mother   . Stroke Father   . Hypertension Father   . Breast cancer Sister     Social History Social History   Tobacco Use  . Smoking status: Never Smoker  . Smokeless tobacco: Never Used  Substance Use Topics  . Alcohol use: No    Alcohol/week: 0.0 standard drinks  . Drug use: No    Review of Systems Constitutional: Negative for fever. Cardiovascular: Negative for chest pain. Respiratory: Negative for shortness of breath.  Negative for cough. Gastrointestinal: Abdominal discomfort last night resolved after bowel movement.  None currently. Musculoskeletal: Mild swelling in her feet. Skin: Negative for skin complaints  Neurological: Negative for headache All other ROS negative  ____________________________________________   PHYSICAL EXAM:  VITAL SIGNS: ED Triage Vitals  Enc Vitals Group     BP 10/12/19 0741 (!) 152/65     Pulse Rate 10/12/19 0741 77      Resp 10/12/19 0741 18     Temp 10/12/19 0741 97.7 F (36.5 C)     Temp Source 10/12/19 0741 Oral     SpO2 10/12/19 0741 97 %     Weight 10/12/19 0742 162 lb (73.5 kg)     Height 10/12/19 0742 5' (1.524 m)     Head Circumference --      Peak Flow --      Pain Score 10/12/19 0742 0     Pain Loc --      Pain Edu? --      Excl. in [place]? --    Constitutional: Alert and oriented. Well appearing and in no distress. Eyes:  Normal exam ENT      Head: Normocephalic and atraumatic.      Mouth/Throat: Mucous membranes are moist. Cardiovascular: Irregular rhythm rate around 70 bpm. Respiratory: Normal respiratory effort without tachypnea nor retractions. Breath sounds are clear  Gastrointestinal: Soft and nontender. No distention.  Musculoskeletal: Extremely slight pedal edema bilaterally.  Good range of motion of extremities.  Neurovascular intact. Neurologic:  Normal speech and language. No gross focal neurologic deficits Skin:  Skin is warm, dry and intact.  Psychiatric: Mood and affect are normal.   ____________________________________________    EKG  EKG viewed and interpreted by myself shows atrial fibrillation at 74 bpm with a narrow QRS, normal axis, largely normal intervals with nonspecific ST changes.  ____________________________________________   INITIAL IMPRESSION / ASSESSMENT AND PLAN / ED COURSE  Pertinent labs & imaging results that were available during my care of the patient were reviewed by me and considered in my medical decision making (see chart for details).   Patient presents emergency department for evaluation of swelling in her feet also states she had some abdominal discomfort last night.  Patient also states she recently found out her daughter cancer had returned and they gave her 2 months to live which the patient states has really upset her recently.  Currently the patient appears well, possible very slight pedal edema but no significant appreciable edema.   Patient has a benign abdominal exam.  No chest pain or shortness of breath.  Patient's cardiologist change the patient's medications yesterday we will hold off on any further changes.  Do not believe the patient requires a diuretic at this time.  We will check labs and continue to monitor the patient.  If no significant findings on lab work I believe the patient would be safe for discharge home with outpatient follow-up.  Patient is agreeable to this plan of care.  Patient's lab work is largely reassuring.  We will discharge the patient home with PCP follow-up.  Patient agreeable to plan of care.  Janice Perkins was evaluated in Emergency Department on 10/12/2019 for the symptoms described in the history of present illness. She was evaluated in the context of the global COVID-19 pandemic, which necessitated consideration that the patient might be at risk for infection with the SARS-CoV-2 virus that causes COVID-19. Institutional protocols and algorithms that pertain to the evaluation of patients at risk for COVID-19 are in a state of rapid change based on information released by regulatory bodies including the CDC and federal and state organizations. These policies and algorithms were followed during the patient's care in the ED.  ____________________________________________   FINAL CLINICAL IMPRESSION(S) / ED DIAGNOSES  Peripheral edema   Harvest Dark, MD 10/12/19 [phone]

## 2019-10-12 NOTE — ED Triage Notes (Signed)
Presents with swelling to both feet/lower legs  Also states she had some tingling in hands

## 2019-10-12 NOTE — ED Notes (Signed)
Patient given warm blanket per request. 

## 2019-10-21 ENCOUNTER — Emergency Department
Admission: EM | Admit: 2019-10-21 | Discharge: 2019-10-21 | Disposition: A | Discharge status: Home / Self Care | Payer: Medicare Other | Attending: Emergency Medicine | Admitting: Emergency Medicine

## 2019-10-21 ENCOUNTER — Encounter: Payer: Self-pay | Admitting: Emergency Medicine

## 2019-10-21 ENCOUNTER — Other Ambulatory Visit: Payer: Self-pay

## 2019-10-21 DIAGNOSIS — J449 Chronic obstructive pulmonary disease, unspecified: Secondary | ICD-10-CM | POA: Insufficient documentation

## 2019-10-21 DIAGNOSIS — R609 Edema, unspecified: Secondary | ICD-10-CM | POA: Insufficient documentation

## 2019-10-21 DIAGNOSIS — Z79899 Other long term (current) drug therapy: Secondary | ICD-10-CM | POA: Insufficient documentation

## 2019-10-21 DIAGNOSIS — R2243 Localized swelling, mass and lump, lower limb, bilateral: Secondary | ICD-10-CM | POA: Diagnosis present

## 2019-10-21 DIAGNOSIS — I10 Essential (primary) hypertension: Secondary | ICD-10-CM | POA: Insufficient documentation

## 2019-10-21 LAB — CBC WITH DIFFERENTIAL/PLATELET
Abs Immature Granulocytes: 0.03 10*3/uL (ref 0.00–0.07)
Basophils Absolute: 0.1 10*3/uL (ref 0.0–0.1)
Basophils Relative: 1 %
Eosinophils Absolute: 0.3 10*3/uL (ref 0.0–0.5)
Eosinophils Relative: 5 %
HCT: 40.2 % (ref 36.0–46.0)
Hemoglobin: 13.3 g/dL (ref 12.0–15.0)
Immature Granulocytes: 1 %
Lymphocytes Relative: 21 %
Lymphs Abs: 1.1 10*3/uL (ref 0.7–4.0)
MCH: 31.6 pg (ref 26.0–34.0)
MCHC: 33.1 g/dL (ref 30.0–36.0)
MCV: 95.5 fL (ref 80.0–100.0)
Monocytes Absolute: 0.9 10*3/uL (ref 0.1–1.0)
Monocytes Relative: 16 %
Neutro Abs: 3 10*3/uL (ref 1.7–7.7)
Neutrophils Relative %: 56 %
Platelets: 228 10*3/uL (ref 150–400)
RBC: 4.21 MIL/uL (ref 3.87–5.11)
RDW: 13.2 % (ref 11.5–15.5)
WBC: 5.3 10*3/uL (ref 4.0–10.5)
nRBC: 0 % (ref 0.0–0.2)

## 2019-10-21 LAB — HEPATIC FUNCTION PANEL
ALT: 21 U/L (ref 0–44)
AST: 32 U/L (ref 15–41)
Albumin: 4.5 g/dL (ref 3.5–5.0)
Alkaline Phosphatase: 47 U/L (ref 38–126)
Bilirubin, Direct: 0.2 mg/dL (ref 0.0–0.2)
Indirect Bilirubin: 0.6 mg/dL (ref 0.3–0.9)
Total Bilirubin: 0.8 mg/dL (ref 0.3–1.2)
Total Protein: 7.8 g/dL (ref 6.5–8.1)

## 2019-10-21 LAB — BASIC METABOLIC PANEL
Anion gap: 11 (ref 5–15)
BUN: 18 mg/dL (ref 8–23)
CO2: 28 mmol/L (ref 22–32)
Calcium: 10.1 mg/dL (ref 8.9–10.3)
Chloride: 99 mmol/L (ref 98–111)
Creatinine, Ser: 0.93 mg/dL (ref 0.44–1.00)
GFR calc Af Amer: >60 mL/min (ref >60)
GFR calc non Af Amer: 56 mL/min — ABNORMAL LOW (ref >60)
Glucose, Bld: 120 mg/dL — ABNORMAL HIGH (ref 70–99)
Potassium: 4.1 mmol/L (ref 3.5–5.1)
Sodium: 138 mmol/L (ref 135–145)

## 2019-10-21 LAB — BRAIN NATRIURETIC PEPTIDE: B Natriuretic Peptide: 124 pg/mL — ABNORMAL HIGH (ref 0.0–100.0)

## 2019-10-21 MED ORDER — FUROSEMIDE 20 MG PO TABS: 20.0000 mg | ORAL_TABLET | Freq: Every day | ORAL | 0 refills | Status: DC

## 2019-10-21 MED ORDER — HYDROCODONE-ACETAMINOPHEN 5-325 MG PO TABS
1.0000 | ORAL_TABLET | Freq: Once | ORAL | Status: AC
  Administered 2019-10-21: 1 via ORAL
  Filled 2019-10-21: qty 1

## 2019-10-21 NOTE — ED Triage Notes (Signed)
Pt to ED via POV c/o bilateral LE edema. Pt states that it has been there for a few weeks. Pt does have pitting edema. Pt is in NAD.

## 2019-10-21 NOTE — ED Notes (Signed)
Pt assisted with phone call to family. Pt ambulating in room to toilet, gait steady. Pt wheeled to lobby to wait for family.

## 2019-10-21 NOTE — ED Provider Notes (Signed)
[facility] Emergency Department Provider Note ____________________________________________   First MD Initiated Contact with Patient 10/21/19 [phone]     (approximate)  I have reviewed the triage vital signs and the nursing notes.   HISTORY  Chief Complaint Leg Swelling    HPI BAYLYNN NARDUCCI is a 86 y.o. female with PMH as noted below who presents with bilateral lower leg swelling, mainly to the feet and ankles but extending somewhat up to the lower legs, gradual onset, and present for a long time. The patient states it has been slightly worse over the last few weeks. She reports that the legs are sore. She denies any shortness of breath, chest pain, fever or chills, or any weakness or numbness.  Past Medical History:  Diagnosis Date  . Asthma   . Atrial fibrillation ([place])   . Breast cancer ([place]) 2011  . Bronchitis 04/2015  . Cancer [facility]) 2011   breast  . COPD (chronic obstructive pulmonary disease) ([place])   . Hypertension   . Shingles    10/2015    Patient Active Problem List   Diagnosis Date Noted  . Acute encephalopathy 08/07/2019  . Fluid overload 06/09/2019  . Bronchitis 05/31/2019  . Hypothyroidism 05/31/2019  . Duodenum ulcer   . Melena   . GI bleed 02/17/2019  . Hypertensive encephalopathy 12/11/2018  . Hypoxia 05/30/2018  . Leukocytosis 04/05/2018  . Pneumonia 11/15/2017  . Pleural effusion 05/16/2017  . AF (paroxysmal atrial fibrillation) ([place]) 05/16/2017  . Breast cancer ([place]) 05/16/2017  . Dependence on nocturnal oxygen therapy 05/16/2017  . HTN (hypertension) 05/16/2017  . Generalized weakness 03/07/2017  . Hyponatremia 03/07/2017  . Dehydration 03/07/2017  . UTI (urinary tract infection) 03/07/2017  . HCAP (healthcare-associated pneumonia) 01/19/2017  . AKI (acute kidney injury) ([place]) 12/18/2016  . Primary cancer of upper outer quadrant of right female breast ([place]) 04/07/2016  . Lumbar radiculopathy 03/23/2016  . Spinal  stenosis, lumbar region, with neurogenic claudication 03/23/2016  . DDD (degenerative disc disease), lumbar 01/14/2015  . Lumbosacral facet joint syndrome 01/14/2015  . Sacroiliac joint dysfunction 01/14/2015  . Greater trochanteric bursitis 01/14/2015  . Bilateral occipital neuralgia 01/14/2015    Past Surgical History:  Procedure Laterality Date  . BREAST EXCISIONAL BIOPSY Right 11/29/13   two areas FAT NECROSIS  . BREAST EXCISIONAL BIOPSY Right 10/30/2009   lumpectomy - radiation  . COLONOSCOPY WITH PROPOFOL N/A 06/10/2018   Procedure: COLONOSCOPY WITH PROPOFOL;  Surgeon: Manya Silvas, MD;  Location: [facility] ENDOSCOPY;  Service: Endoscopy;  Laterality: N/A;  . COLONOSCOPY WITH PROPOFOL N/A 02/20/2019   Procedure: COLONOSCOPY WITH PROPOFOL;  Surgeon: Lucilla Lame, MD;  Location: [facility] ENDOSCOPY;  Service: Endoscopy;  Laterality: N/A;  . ESOPHAGOGASTRODUODENOSCOPY (EGD) WITH PROPOFOL N/A 06/10/2018   Procedure: ESOPHAGOGASTRODUODENOSCOPY (EGD) WITH PROPOFOL;  Surgeon: Manya Silvas, MD;  Location: [facility] ENDOSCOPY;  Service: Endoscopy;  Laterality: N/A;  . ESOPHAGOGASTRODUODENOSCOPY (EGD) WITH PROPOFOL N/A 02/20/2019   Procedure: ESOPHAGOGASTRODUODENOSCOPY (EGD) WITH PROPOFOL;  Surgeon: Lucilla Lame, MD;  Location: [facility] ENDOSCOPY;  Service: Endoscopy;  Laterality: N/A;  . NASAL SINUS SURGERY      Prior to Admission medications   Medication Sig Start Date End Date Taking? Authorizing Provider  albuterol (ACCUNEB) 1.25 MG/3ML nebulizer solution Inhale 3 mLs into the lungs every 6 (six) hours as needed for wheezing.     [provider]  albuterol (PROVENTIL HFA;VENTOLIN HFA) 108 (90 Base) MCG/ACT inhaler Inhale 2 puffs into the lungs every 6 (six) hours as needed  for wheezing or shortness of breath.     [provider]  ALPRAZolam Duanne Moron) 0.5 MG tablet Take 0.5 mg by mouth 2 (two) times a day.     [provider]  alum & mag hydroxide-simeth (MAALOX/MYLANTA)  200-200-20 MG/5ML suspension Take 30 mLs by mouth every 4 (four) hours as needed for indigestion or heartburn. 06/08/19   Demetrios Loll, MD  atorvastatin (LIPITOR) 40 MG tablet Take 40 mg by mouth daily. 03/17/19   [provider]  azithromycin (ZITHROMAX Z-PAK) 250 MG tablet Take 2 tablets (500 mg) on  Day 1,  followed by 1 tablet (250 mg) once daily on Days 2 through 5. 09/18/19   Duffy Bruce, MD  benzonatate (TESSALON PERLES) 100 MG capsule Take 1 capsule (100 mg total) by mouth 3 (three) times daily as needed for cough. 08/25/19 08/24/20  Blake Divine, MD  benzonatate (TESSALON PERLES) 100 MG capsule Take 1 capsule (100 mg total) by mouth 3 (three) times daily as needed for cough. 09/18/19 09/17/20  Duffy Bruce, MD  busPIRone (BUSPAR) 10 MG tablet Take 10 mg by mouth 2 (two) times daily.    [provider]  cholecalciferol (VITAMIN D) 1000 units tablet Take 1,000 Units by mouth daily.    [provider]  cloNIDine (CATAPRES) 0.1 MG tablet Take 1 tablet (0.1 mg total) by mouth 3 (three) times daily. 08/08/19   Ezekiel Slocumb, DO  diltiazem (CARDIZEM CD) 180 MG 24 hr capsule Take 1 capsule (180 mg total) by mouth daily. 08/08/19   Ezekiel Slocumb, DO  famotidine (PEPCID) 20 MG tablet Take 20 mg by mouth 2 (two) times daily. 07/29/19   [provider]  ferrous sulfate 325 (65 FE) MG tablet Take 325 mg by mouth daily with breakfast.    [provider]  Fluticasone-Salmeterol (ADVAIR) 250-50 MCG/DOSE AEPB Inhale 1 puff into the lungs 2 (two) times daily.    [provider]  furosemide (LASIX) 20 MG tablet Take 1 tablet (20 mg total) by mouth daily for 7 days. 10/21/19 10/28/19  Arta Silence, MD  gabapentin (NEURONTIN) 300 MG capsule Take 300 mg by mouth daily.    [provider]  hydrALAZINE (APRESOLINE) 50 MG tablet Take 50 mg by mouth 3 (three) times daily. 07/10/19   [provider]  HYDROcodone-acetaminophen  (NORCO/VICODIN) 5-325 MG tablet Take 1 tablet by mouth 2 (two) times daily.  04/26/19   [provider]  levothyroxine (SYNTHROID, LEVOTHROID) 50 MCG tablet Take 50 mcg by mouth daily before breakfast.    [provider]  loratadine (CLARITIN) 10 MG tablet Take 10 mg by mouth daily.    [provider]  losartan (COZAAR) 50 MG tablet Take 2 tablets (100 mg total) by mouth at bedtime. 12/13/18   Loletha Grayer, MD  magnesium oxide (MAG-OX) 400 MG tablet Take 400 mg by mouth 2 (two) times daily.    [provider]  meloxicam (MOBIC) 15 MG tablet Take 15 mg by mouth daily. 02/07/19   [provider]  metoprolol succinate (TOPROL-XL) 25 MG 24 hr tablet Take 1 tablet (25 mg total) by mouth daily. 06/10/19   Loletha Grayer, MD  mometasone-formoterol (DULERA) 100-5 MCG/ACT AERO Inhale 2 puffs into the lungs 2 (two) times daily.    [provider]  polyethylene glycol (MIRALAX / GLYCOLAX) 17 g packet Take 17 g by mouth daily. 01/03/19   Earleen Newport, MD  RABEprazole (ACIPHEX) 20 MG tablet Take 20 mg by mouth  2 (two) times daily.    [provider]  venlafaxine XR (EFFEXOR-XR) 75 MG 24 hr capsule Take 75 mg by mouth daily.    [provider]    Allergies Diphenhydramine hcl, Penicillins, Captopril, Eliquis [apixaban], Enalapril maleate, Tape, Terfenadine, Augmentin [amoxicillin-pot clavulanate], and Biaxin [clarithromycin]  Family History  Problem Relation Age of Onset  . Hypertension Mother   . Heart disease Mother   . Cancer Mother   . Stroke Father   . Hypertension Father   . Breast cancer Sister     Social History Social History   Tobacco Use  . Smoking status: Never Smoker  . Smokeless tobacco: Never Used  Substance Use Topics  . Alcohol use: No    Alcohol/week: 0.0 standard drinks  . Drug use: No    Review of Systems  Constitutional: No fever. Eyes: No redness. ENT: No sore throat. Cardiovascular:  Denies chest pain. Respiratory: Denies shortness of breath. Gastrointestinal: No vomiting. Genitourinary: Negative for dysuria.  Musculoskeletal: Negative for back pain. Positive for edema. Skin: Negative for rash. Neurological: Negative for focal weakness or numbness.   ____________________________________________   PHYSICAL EXAM:  VITAL SIGNS: ED Triage Vitals  Enc Vitals Group     BP 10/21/19 0819 (!) 141/72     Pulse Rate 10/21/19 0819 74     Resp 10/21/19 0819 16     Temp 10/21/19 0819 97.8 F (36.6 C)     Temp Source 10/21/19 0819 Oral     SpO2 10/21/19 0819 99 %     Weight 10/21/19 0820 162 lb (73.5 kg)     Height 10/21/19 0820 5' (1.524 m)     Head Circumference --      Peak Flow --      Pain Score 10/21/19 0820 0     Pain Loc --      Pain Edu? --      Excl. in [place]? --     Constitutional: Alert and oriented. Well appearing and in no acute distress. Eyes: Conjunctivae are normal.  Head: Atraumatic. Nose: No congestion/rhinnorhea. Mouth/Throat: Mucous membranes are moist.   Neck: Normal range of motion.  Cardiovascular: Normal rate, regular rhythm.  Good peripheral circulation. Respiratory: Normal respiratory effort.  No retractions.  Gastrointestinal: No distention.  Musculoskeletal: Trace to 1+ bilateral lower extremity edema mainly to the feet and ankles.  Extremities warm and well perfused. 2+ DP pulses bilaterally. Neurologic:  Normal speech and language. No gross focal neurologic deficits are appreciated.  Skin:  Skin is warm and dry. No rash noted. Psychiatric: Mood and affect are normal. Speech and behavior are normal.  ____________________________________________   LABS (all labs ordered are listed, but only abnormal results are displayed)  Labs Reviewed  BASIC METABOLIC PANEL - Abnormal; Notable for the following components:      Result Value   Glucose, Bld 120 (*)    GFR calc non Af Amer 56 (*)    All other components within normal limits    BRAIN NATRIURETIC PEPTIDE - Abnormal; Notable for the following components:   B Natriuretic Peptide 124.0 (*)    All other components within normal limits  HEPATIC FUNCTION PANEL  CBC WITH DIFFERENTIAL/PLATELET   ____________________________________________  EKG   ____________________________________________  RADIOLOGY    ____________________________________________   PROCEDURES  Procedure(s) performed: No  Procedures  Critical Care performed: No ____________________________________________   INITIAL IMPRESSION / ASSESSMENT AND PLAN / ED COURSE  Pertinent labs & imaging results that were available during my  care of the patient were reviewed by me and considered in my medical decision making (see chart for details).  86 year old female with PMH as noted above presents with bilateral lower leg edema mainly concentrated in the feet and ankles which has been present for some time, but seems to be slightly worse in the last few weeks and associated with some pain.  I reviewed the past medical records in [place]. The patient was seen in the ED for this issue on 2/25, and states that it is about the same. She states her cardiologist told her it was due to one of her blood pressure medications, which she discontinued but it has not helped. I see that the patient has Lasix listed in her medications, however she states that this was from a long time ago and that she is not on any diuretic at this time.  On exam, the patient is overall well-appearing for her age. Her vital signs are normal. The physical exam is unremarkable except for mild edema primarily around the ankles and going slightly proximal.  Presentation is consistent with benign dependent peripheral edema. I have a low suspicion for cardiac, renal, or hepatic etiology. We will obtain labs. If there are no concerning lab findings, I will consider putting the patient on a low-dose of a diuretic for a few  days.  ----------------------------------------- 11:29 AM on 10/21/2019 -----------------------------------------  Lab work-up is reassuring.  At this time, the patient is stable for discharge home.  Because the edema started to cause the patient some discomfort, I will prescribe a week of 20 mg daily of Lasix which she has been on previously.  I also instructed her to elevate the legs.  She will follow up with her PMD and cardiologist.  Return precautions given, and she expresses understanding.  ____________________________________________   FINAL CLINICAL IMPRESSION(S) / ED DIAGNOSES  Final diagnoses:  Peripheral edema      NEW MEDICATIONS STARTED DURING THIS VISIT:  New Prescriptions   FUROSEMIDE (LASIX) 20 MG TABLET    Take 1 tablet (20 mg total) by mouth daily for 7 days.     Note:  This document was prepared using Dragon voice recognition software and may include unintentional dictation errors.    Arta Silence, MD 10/21/19 [phone]

## 2019-10-21 NOTE — Discharge Instructions (Addendum)
Take the Lasix once daily for the next week as prescribed.  Follow-up with your primary care doctor and with Dr. Ubaldo Glassing.  Keep your legs elevated when you are able to.  Return to the ER for new, worsening, or persistent severe leg swelling, pain, shortness of breath, or any other new or worsening symptoms that concern you.

## 2019-10-21 NOTE — ED Notes (Signed)
Pt given warm blanket.

## 2019-10-29 ENCOUNTER — Other Ambulatory Visit: Payer: Self-pay

## 2019-10-29 ENCOUNTER — Encounter: Payer: Self-pay | Admitting: Emergency Medicine

## 2019-10-29 ENCOUNTER — Emergency Department
Admission: EM | Admit: 2019-10-29 | Discharge: 2019-10-29 | Disposition: A | Discharge status: Home / Self Care | Payer: Medicare Other | Attending: Emergency Medicine | Admitting: Emergency Medicine

## 2019-10-29 DIAGNOSIS — Z853 Personal history of malignant neoplasm of breast: Secondary | ICD-10-CM | POA: Diagnosis not present

## 2019-10-29 DIAGNOSIS — J449 Chronic obstructive pulmonary disease, unspecified: Secondary | ICD-10-CM | POA: Diagnosis not present

## 2019-10-29 DIAGNOSIS — R110 Nausea: Secondary | ICD-10-CM | POA: Diagnosis not present

## 2019-10-29 DIAGNOSIS — Z79899 Other long term (current) drug therapy: Secondary | ICD-10-CM | POA: Insufficient documentation

## 2019-10-29 DIAGNOSIS — R42 Dizziness and giddiness: Secondary | ICD-10-CM | POA: Insufficient documentation

## 2019-10-29 DIAGNOSIS — I10 Essential (primary) hypertension: Secondary | ICD-10-CM | POA: Diagnosis not present

## 2019-10-29 DIAGNOSIS — E039 Hypothyroidism, unspecified: Secondary | ICD-10-CM | POA: Diagnosis not present

## 2019-10-29 LAB — COMPREHENSIVE METABOLIC PANEL
ALT: 19 U/L (ref 0–44)
AST: 24 U/L (ref 15–41)
Albumin: 4.6 g/dL (ref 3.5–5.0)
Alkaline Phosphatase: 46 U/L (ref 38–126)
Anion gap: 8 (ref 5–15)
BUN: 19 mg/dL (ref 8–23)
CO2: 31 mmol/L (ref 22–32)
Calcium: 9.9 mg/dL (ref 8.9–10.3)
Chloride: 96 mmol/L — ABNORMAL LOW (ref 98–111)
Creatinine, Ser: 0.95 mg/dL (ref 0.44–1.00)
GFR calc Af Amer: >60 mL/min (ref >60)
GFR calc non Af Amer: 55 mL/min — ABNORMAL LOW (ref >60)
Glucose, Bld: 120 mg/dL — ABNORMAL HIGH (ref 70–99)
Potassium: 4.6 mmol/L (ref 3.5–5.1)
Sodium: 135 mmol/L (ref 135–145)
Total Bilirubin: 1.1 mg/dL (ref 0.3–1.2)
Total Protein: 7.8 g/dL (ref 6.5–8.1)

## 2019-10-29 LAB — URINALYSIS, COMPLETE (UACMP) WITH MICROSCOPIC
Bilirubin Urine: NEGATIVE
Glucose, UA: NEGATIVE mg/dL
Hgb urine dipstick: NEGATIVE
Ketones, ur: NEGATIVE mg/dL
Leukocytes,Ua: NEGATIVE
Nitrite: NEGATIVE
Protein, ur: NEGATIVE mg/dL
Specific Gravity, Urine: 1.003 — ABNORMAL LOW (ref 1.005–1.030)
pH: 8 (ref 5.0–8.0)

## 2019-10-29 LAB — CBC
HCT: 38.9 % (ref 36.0–46.0)
Hemoglobin: 13 g/dL (ref 12.0–15.0)
MCH: 31.6 pg (ref 26.0–34.0)
MCHC: 33.4 g/dL (ref 30.0–36.0)
MCV: 94.4 fL (ref 80.0–100.0)
Platelets: 254 10*3/uL (ref 150–400)
RBC: 4.12 MIL/uL (ref 3.87–5.11)
RDW: 12.9 % (ref 11.5–15.5)
WBC: 8.1 10*3/uL (ref 4.0–10.5)
nRBC: 0 % (ref 0.0–0.2)

## 2019-10-29 LAB — LIPASE, BLOOD: Lipase: 25 U/L (ref 11–51)

## 2019-10-29 MED ORDER — MECLIZINE HCL 25 MG PO TABS
12.5000 mg | ORAL_TABLET | Freq: Once | ORAL | Status: AC
  Administered 2019-10-29: 12.5 mg via ORAL
  Filled 2019-10-29: qty 1

## 2019-10-29 NOTE — Discharge Instructions (Signed)
Your workup in the Emergency Department today was reassuring.  We did not find any specific abnormalities.  We recommend you drink plenty of fluids, take your regular medications and/or any new ones prescribed today, and follow up with the doctor(s) listed in these documents as recommended.  Return to the Emergency Department if you develop new or worsening symptoms that concern you.  

## 2019-10-29 NOTE — ED Provider Notes (Signed)
[facility] Emergency Department Provider Note  ____________________________________________   First MD Initiated Contact with Patient 10/29/19 [phone]     (approximate)  I have reviewed the triage vital signs and the nursing notes.   HISTORY  Chief Complaint Nausea    HPI Janice Perkins is a 86 y.o. female with extensive chronic medical history and listed adverse reactions or allergies to medications.  She presents tonight for evaluation of nausea x2 days.  She says that the nausea was worse yesterday.  She has Zofran at home but she did not take it because she was not sure if it might interact with her other medications.  She has also had some dizziness that feels like her existing vertigo for which she has taken meclizine in the past but she decided not to take the meclizine for similar reasons.  She says she has been taking her other medicines.  She says the nausea has been severe but she has not had any vomiting.  No diarrhea nor constipation.  No abdominal pain.  She denies fever/chills, sore throat, chest pain, shortness of breath.  No focal weakness in her extremities.  Nothing in particular makes the symptoms better or worse but she has not taken any medication for her symptoms.         Past Medical History:  Diagnosis Date  . Asthma   . Atrial fibrillation ([place])   . Breast cancer ([place]) 2011  . Bronchitis 04/2015  . Cancer [facility]) 2011   breast  . COPD (chronic obstructive pulmonary disease) ([place])   . Hypertension   . Shingles    10/2015    Patient Active Problem List   Diagnosis Date Noted  . Acute encephalopathy 08/07/2019  . Fluid overload 06/09/2019  . Bronchitis 05/31/2019  . Hypothyroidism 05/31/2019  . Duodenum ulcer   . Melena   . GI bleed 02/17/2019  . Hypertensive encephalopathy 12/11/2018  . Hypoxia 05/30/2018  . Leukocytosis 04/05/2018  . Pneumonia 11/15/2017  . Pleural effusion 05/16/2017  . AF (paroxysmal atrial  fibrillation) ([place]) 05/16/2017  . Breast cancer ([place]) 05/16/2017  . Dependence on nocturnal oxygen therapy 05/16/2017  . HTN (hypertension) 05/16/2017  . Generalized weakness 03/07/2017  . Hyponatremia 03/07/2017  . Dehydration 03/07/2017  . UTI (urinary tract infection) 03/07/2017  . HCAP (healthcare-associated pneumonia) 01/19/2017  . AKI (acute kidney injury) ([place]) 12/18/2016  . Primary cancer of upper outer quadrant of right female breast ([place]) 04/07/2016  . Lumbar radiculopathy 03/23/2016  . Spinal stenosis, lumbar region, with neurogenic claudication 03/23/2016  . DDD (degenerative disc disease), lumbar 01/14/2015  . Lumbosacral facet joint syndrome 01/14/2015  . Sacroiliac joint dysfunction 01/14/2015  . Greater trochanteric bursitis 01/14/2015  . Bilateral occipital neuralgia 01/14/2015    Past Surgical History:  Procedure Laterality Date  . BREAST EXCISIONAL BIOPSY Right 11/29/13   two areas FAT NECROSIS  . BREAST EXCISIONAL BIOPSY Right 10/30/2009   lumpectomy - radiation  . COLONOSCOPY WITH PROPOFOL N/A 06/10/2018   Procedure: COLONOSCOPY WITH PROPOFOL;  Surgeon: Manya Silvas, MD;  Location: [facility] ENDOSCOPY;  Service: Endoscopy;  Laterality: N/A;  . COLONOSCOPY WITH PROPOFOL N/A 02/20/2019   Procedure: COLONOSCOPY WITH PROPOFOL;  Surgeon: Lucilla Lame, MD;  Location: [facility] ENDOSCOPY;  Service: Endoscopy;  Laterality: N/A;  . ESOPHAGOGASTRODUODENOSCOPY (EGD) WITH PROPOFOL N/A 06/10/2018   Procedure: ESOPHAGOGASTRODUODENOSCOPY (EGD) WITH PROPOFOL;  Surgeon: Manya Silvas, MD;  Location: [facility] ENDOSCOPY;  Service: Endoscopy;  Laterality: N/A;  . ESOPHAGOGASTRODUODENOSCOPY (EGD) WITH PROPOFOL  N/A 02/20/2019   Procedure: ESOPHAGOGASTRODUODENOSCOPY (EGD) WITH PROPOFOL;  Surgeon: Lucilla Lame, MD;  Location: [facility] ENDOSCOPY;  Service: Endoscopy;  Laterality: N/A;  . NASAL SINUS SURGERY      Prior to Admission medications   Medication Sig Start Date End Date Taking?  Authorizing Provider  albuterol (ACCUNEB) 1.25 MG/3ML nebulizer solution Inhale 3 mLs into the lungs every 6 (six) hours as needed for wheezing.     [provider]  albuterol (PROVENTIL HFA;VENTOLIN HFA) 108 (90 Base) MCG/ACT inhaler Inhale 2 puffs into the lungs every 6 (six) hours as needed for wheezing or shortness of breath.     [provider]  ALPRAZolam Duanne Moron) 0.5 MG tablet Take 0.5 mg by mouth 2 (two) times a day.     [provider]  alum & mag hydroxide-simeth (MAALOX/MYLANTA) 200-200-20 MG/5ML suspension Take 30 mLs by mouth every 4 (four) hours as needed for indigestion or heartburn. 06/08/19   Demetrios Loll, MD  atorvastatin (LIPITOR) 40 MG tablet Take 40 mg by mouth daily. 03/17/19   [provider]  azithromycin (ZITHROMAX Z-PAK) 250 MG tablet Take 2 tablets (500 mg) on  Day 1,  followed by 1 tablet (250 mg) once daily on Days 2 through 5. 09/18/19   Duffy Bruce, MD  benzonatate (TESSALON PERLES) 100 MG capsule Take 1 capsule (100 mg total) by mouth 3 (three) times daily as needed for cough. 08/25/19 08/24/20  Blake Divine, MD  benzonatate (TESSALON PERLES) 100 MG capsule Take 1 capsule (100 mg total) by mouth 3 (three) times daily as needed for cough. 09/18/19 09/17/20  Duffy Bruce, MD  busPIRone (BUSPAR) 10 MG tablet Take 10 mg by mouth 2 (two) times daily.    [provider]  cholecalciferol (VITAMIN D) 1000 units tablet Take 1,000 Units by mouth daily.    [provider]  cloNIDine (CATAPRES) 0.1 MG tablet Take 1 tablet (0.1 mg total) by mouth 3 (three) times daily. 08/08/19   Ezekiel Slocumb, DO  diltiazem (CARDIZEM CD) 180 MG 24 hr capsule Take 1 capsule (180 mg total) by mouth daily. 08/08/19   Ezekiel Slocumb, DO  famotidine (PEPCID) 20 MG tablet Take 20 mg by mouth 2 (two) times daily. 07/29/19   [provider]  ferrous sulfate 325 (65 FE) MG tablet Take 325 mg by mouth daily with breakfast.    [provider]  Fluticasone-Salmeterol (ADVAIR) 250-50 MCG/DOSE AEPB Inhale 1 puff into the lungs 2 (two) times daily.    [provider]  furosemide (LASIX) 20 MG tablet Take 1 tablet (20 mg total) by mouth daily for 7 days. 10/21/19 10/28/19  Arta Silence, MD  gabapentin (NEURONTIN) 300 MG capsule Take 300 mg by mouth daily.    [provider]  hydrALAZINE (APRESOLINE) 50 MG tablet Take 50 mg by mouth 3 (three) times daily. 07/10/19   [provider]  HYDROcodone-acetaminophen (NORCO/VICODIN) 5-325 MG tablet Take 1 tablet by mouth 2 (two) times daily.  04/26/19   [provider]  levothyroxine (SYNTHROID, LEVOTHROID) 50 MCG tablet Take 50 mcg by mouth daily before breakfast.    [provider]  loratadine (CLARITIN) 10 MG tablet Take 10 mg by mouth daily.    [provider]  losartan (COZAAR) 50 MG tablet Take 2 tablets (100 mg total) by mouth at bedtime. 12/13/18   Loletha Grayer, MD  magnesium oxide (MAG-OX) 400 MG tablet Take 400 mg by mouth 2 (two) times daily.    [provider]  meloxicam (MOBIC) 15 MG tablet Take 15 mg by mouth daily. 02/07/19   [provider]  metoprolol succinate (TOPROL-XL) 25 MG 24 hr tablet Take 1 tablet (25 mg total) by mouth daily. 06/10/19   Loletha Grayer, MD  mometasone-formoterol (DULERA) 100-5 MCG/ACT AERO Inhale 2 puffs into the lungs 2 (two) times daily.    [provider]  polyethylene glycol (MIRALAX / GLYCOLAX) 17 g packet Take 17 g by mouth daily. 01/03/19   Earleen Newport, MD  RABEprazole (ACIPHEX) 20 MG tablet Take 20 mg by mouth 2 (two) times daily.    [provider]  venlafaxine XR (EFFEXOR-XR) 75 MG 24 hr capsule Take 75 mg by mouth daily.    [provider]    Allergies Diphenhydramine hcl, Penicillins, Captopril, Eliquis [apixaban], Enalapril maleate, Tape, Terfenadine, Augmentin [amoxicillin-pot clavulanate], and Biaxin  [clarithromycin]  Family History  Problem Relation Age of Onset  . Hypertension Mother   . Heart disease Mother   . Cancer Mother   . Stroke Father   . Hypertension Father   . Breast cancer Sister     Social History Social History   Tobacco Use  . Smoking status: Never Smoker  . Smokeless tobacco: Never Used  Substance Use Topics  . Alcohol use: No    Alcohol/week: 0.0 standard drinks  . Drug use: No    Review of Systems Constitutional: No fever/chills Eyes: No visual changes. ENT: No sore throat. Cardiovascular: Denies chest pain. Respiratory: Denies shortness of breath. Gastrointestinal: Nausea without vomiting.  No abdominal pain.  No diarrhea.  No constipation. Genitourinary: Negative for dysuria. Musculoskeletal: Negative for neck pain.  Negative for back pain. Integumentary: Negative for rash. Neurological: Some dizziness consistent with prior vertigo.  Negative for headaches, focal weakness or numbness.   ____________________________________________   PHYSICAL EXAM:  VITAL SIGNS: ED Triage Vitals  Enc Vitals Group     BP 10/29/19 0307 (!) 165/98     Pulse Rate 10/29/19 0307 86     Resp 10/29/19 0307 17     Temp 10/29/19 0307 98.5 F (36.9 C)     Temp Source 10/29/19 0307 Oral     SpO2 10/29/19 0307 98 %     Weight 10/29/19 0302 73 kg (161 lb)     Height 10/29/19 0302 1.524 m (5')     Head Circumference --      Peak Flow --      Pain Score 10/29/19 0301 8     Pain Loc --      Pain Edu? --      Excl. in [place]? --     Constitutional: Alert and oriented.  No acute distress at this time. Eyes: Conjunctivae are normal.  Head: Atraumatic. Nose: No congestion/rhinnorhea. Mouth/Throat: Patient is wearing a mask. Neck: No stridor.  No meningeal signs.   Cardiovascular: Normal rate, regular rhythm. Good peripheral circulation. Grossly normal heart sounds. Respiratory: Normal respiratory effort.  No retractions. Gastrointestinal: Soft and nontender. No  distention.  Musculoskeletal: No lower extremity tenderness nor edema. No gross deformities of extremities. Neurologic:  Normal speech and language. No gross focal neurologic deficits are appreciated.  Skin:  Skin is warm, dry and intact. Psychiatric: Mood and affect are a little bit anxious but generally unremarkable.  No emergent abnormalities.  ____________________________________________   LABS (all labs ordered are listed, but only abnormal results are displayed)  Labs Reviewed  COMPREHENSIVE METABOLIC PANEL - Abnormal; Notable for the following components:  Result Value   Chloride 96 (*)    Glucose, Bld 120 (*)    GFR calc non Af Amer 55 (*)    All other components within normal limits  URINALYSIS, COMPLETE (UACMP) WITH MICROSCOPIC - Abnormal; Notable for the following components:   Color, Urine STRAW (*)    APPearance CLEAR (*)    Specific Gravity, Urine 1.003 (*)    Bacteria, UA RARE (*)    All other components within normal limits  LIPASE, BLOOD  CBC   ____________________________________________  EKG  No indication for emergent EKG ____________________________________________  RADIOLOGY I, Hinda Kehr, personally viewed and evaluated these images (plain radiographs) as part of my medical decision making, as well as reviewing the written report by the radiologist.  ED MD interpretation: No indication for emergent imaging  Official radiology report(s): No results found.  ____________________________________________   PROCEDURES   Procedure(s) performed (including Critical Care):  Procedures   ____________________________________________   INITIAL IMPRESSION / MDM / [place] / ED COURSE  As part of my medical decision making, I reviewed the following data within the [place] notes reviewed and incorporated, Labs reviewed , Old chart reviewed, Notes from prior ED visits and [place] Controlled Substance  Database   Differential diagnosis includes, but is not limited to, anxiety, medication side effect, metabolic or electrolyte abnormality, vertigo, less likely CVA, SBO/ileus.  The patient is well-appearing and in no distress.  She is very talkative and able to tell me quite a bit about her medical history as well as recent symptoms, her specialist appointments, etc.  She has medication to help with nausea and vertigo at home but she elected not to take it.  Her nausea has resolved at this time.  She has no tenderness to palpation of the abdomen, no guarding, no distention.  No indication for emergent imaging.  Her labs are within normal limits including her metabolic panel and CBC.  No indication of infection on urinalysis.  Vital signs are stable other than some hypertension.  I will give the patient a small dose of meclizine 12.5 mg which she has had in the past.  She says she does not need any Zofran because she has at home.  I encouraged her to take it easy today, use the as needed medications as written, and follow-up with her doctor on Monday.  She understands and agrees with the plan.  Of note she said that her anxiety is really bothering her and that she needs something for her nerves, but she also said that she has prescriptions for Xanax and Klonopin at home and I think it is more appropriate for her to take medication she has been prescribed previously since I try to avoid giving benzodiazepines to geriatric patients.          ____________________________________________  FINAL CLINICAL IMPRESSION(S) / ED DIAGNOSES  Final diagnoses:  Nausea     MEDICATIONS GIVEN DURING THIS VISIT:  Medications  meclizine (ANTIVERT) tablet 12.5 mg (has no administration in time range)     ED Discharge Orders    None      *Please note:  Janice Perkins was evaluated in Emergency Department on 10/29/2019 for the symptoms described in the history of present illness. She was evaluated in the  context of the global COVID-19 pandemic, which necessitated consideration that the patient might be at risk for infection with the SARS-CoV-2 virus that causes COVID-19. Institutional protocols and algorithms that pertain to the evaluation  of patients at risk for COVID-19 are in a state of rapid change based on information released by regulatory bodies including the CDC and federal and state organizations. These policies and algorithms were followed during the patient's care in the ED.  Some ED evaluations and interventions may be delayed as a result of limited staffing during the pandemic.*  Note:  This document was prepared using Dragon voice recognition software and may include unintentional dictation errors.   Hinda Kehr, MD 10/29/19 [phone]

## 2019-10-29 NOTE — ED Triage Notes (Signed)
Pt arrives ambulatory to triage with c/o being nauseated x2 mornings. Pt is currently on lasix. Pt is in NAD.

## 2019-10-29 NOTE — ED Notes (Signed)
This RN attempted phlebotomy x 2 on pt. Lab called at this time to collect specimens. Pt states unable to void at this time.

## 2019-10-29 NOTE — ED Notes (Signed)
Reports being placed on a 7 day supply of lasix and states she has experienced nausea for the last 2 days with no relief.

## 2019-10-30 ENCOUNTER — Other Ambulatory Visit: Payer: Self-pay

## 2019-10-30 ENCOUNTER — Emergency Department: Payer: Medicare Other

## 2019-10-30 ENCOUNTER — Emergency Department
Admission: EM | Admit: 2019-10-30 | Discharge: 2019-10-30 | Disposition: A | Discharge status: Home / Self Care | Payer: Medicare Other | Attending: Emergency Medicine | Admitting: Emergency Medicine

## 2019-10-30 ENCOUNTER — Encounter: Payer: Self-pay | Admitting: Emergency Medicine

## 2019-10-30 DIAGNOSIS — I4891 Unspecified atrial fibrillation: Secondary | ICD-10-CM

## 2019-10-30 DIAGNOSIS — R519 Headache, unspecified: Secondary | ICD-10-CM | POA: Diagnosis present

## 2019-10-30 DIAGNOSIS — J449 Chronic obstructive pulmonary disease, unspecified: Secondary | ICD-10-CM | POA: Insufficient documentation

## 2019-10-30 DIAGNOSIS — I10 Essential (primary) hypertension: Secondary | ICD-10-CM | POA: Insufficient documentation

## 2019-10-30 DIAGNOSIS — Z8669 Personal history of other diseases of the nervous system and sense organs: Secondary | ICD-10-CM | POA: Diagnosis not present

## 2019-10-30 DIAGNOSIS — R61 Generalized hyperhidrosis: Secondary | ICD-10-CM | POA: Insufficient documentation

## 2019-10-30 DIAGNOSIS — Z853 Personal history of malignant neoplasm of breast: Secondary | ICD-10-CM | POA: Diagnosis not present

## 2019-10-30 DIAGNOSIS — F419 Anxiety disorder, unspecified: Secondary | ICD-10-CM

## 2019-10-30 DIAGNOSIS — Z79899 Other long term (current) drug therapy: Secondary | ICD-10-CM | POA: Insufficient documentation

## 2019-10-30 LAB — CBC WITH DIFFERENTIAL/PLATELET
Abs Immature Granulocytes: 0.05 10*3/uL (ref 0.00–0.07)
Basophils Absolute: 0.1 10*3/uL (ref 0.0–0.1)
Basophils Relative: 1 %
Eosinophils Absolute: 0.2 10*3/uL (ref 0.0–0.5)
Eosinophils Relative: 2 %
HCT: 42 % (ref 36.0–46.0)
Hemoglobin: 14.4 g/dL (ref 12.0–15.0)
Immature Granulocytes: 1 %
Lymphocytes Relative: 22 %
Lymphs Abs: 1.7 10*3/uL (ref 0.7–4.0)
MCH: 31.9 pg (ref 26.0–34.0)
MCHC: 34.3 g/dL (ref 30.0–36.0)
MCV: 92.9 fL (ref 80.0–100.0)
Monocytes Absolute: 0.9 10*3/uL (ref 0.1–1.0)
Monocytes Relative: 11 %
Neutro Abs: 5 10*3/uL (ref 1.7–7.7)
Neutrophils Relative %: 63 %
Platelets: 270 10*3/uL (ref 150–400)
RBC: 4.52 MIL/uL (ref 3.87–5.11)
RDW: 12.9 % (ref 11.5–15.5)
WBC: 7.8 10*3/uL (ref 4.0–10.5)
nRBC: 0 % (ref 0.0–0.2)

## 2019-10-30 LAB — BASIC METABOLIC PANEL
Anion gap: 14 (ref 5–15)
BUN: 19 mg/dL (ref 8–23)
CO2: 24 mmol/L (ref 22–32)
Calcium: 9.9 mg/dL (ref 8.9–10.3)
Chloride: 94 mmol/L — ABNORMAL LOW (ref 98–111)
Creatinine, Ser: 0.93 mg/dL (ref 0.44–1.00)
GFR calc Af Amer: >60 mL/min (ref >60)
GFR calc non Af Amer: 56 mL/min — ABNORMAL LOW (ref >60)
Glucose, Bld: 119 mg/dL — ABNORMAL HIGH (ref 70–99)
Potassium: 3.8 mmol/L (ref 3.5–5.1)
Sodium: 132 mmol/L — ABNORMAL LOW (ref 135–145)

## 2019-10-30 LAB — TROPONIN I (HIGH SENSITIVITY): Troponin I (High Sensitivity): 11 ng/L (ref <18)

## 2019-10-30 MED ORDER — ALPRAZOLAM 0.5 MG PO TABS
0.5000 mg | ORAL_TABLET | Freq: Once | ORAL | Status: AC
  Administered 2019-10-30: 0.5 mg via ORAL
  Filled 2019-10-30: qty 1

## 2019-10-30 MED ORDER — CLONIDINE HCL 0.1 MG PO TABS
0.1000 mg | ORAL_TABLET | Freq: Three times a day (TID) | ORAL | Status: DC
  Administered 2019-10-30: 08:00:00 0.1 mg via ORAL
  Filled 2019-10-30: qty 1

## 2019-10-30 MED ORDER — HYDRALAZINE HCL 50 MG PO TABS
50.0000 mg | ORAL_TABLET | Freq: Three times a day (TID) | ORAL | Status: DC
  Administered 2019-10-30: 50 mg via ORAL
  Filled 2019-10-30: qty 1

## 2019-10-30 NOTE — Discharge Instructions (Addendum)
Your labs and CT scan were okay today.  Your blood pressure went back to normal after taking your hydralazine and clonidine.  Please continue taking your blood pressure medicines as prescribed and follow-up with your doctor this week for further assessment of your symptoms.

## 2019-10-30 NOTE — ED Provider Notes (Signed)
[facility] Emergency Department Provider Note  ____________________________________________  Time seen: Approximately 8:42 AM  I have reviewed the triage vital signs and the nursing notes.   HISTORY  Chief Complaint Hypertension    HPI Janice Perkins is a 86 y.o. female with a history of COPD, breast cancer, A. fib, hypertension, anxiety who woke up this morning at about 3:30 AM, felt anxious with mild right-sided headache, diaphoretic.  No vision changes paresthesias or weakness.  She checked her blood pressure and it was about 200/100, so she called EMS who brought her to the emergency department.  Denies chest pain back pain abdominal pain or shortness of breath.  No vision changes.  No change in balance or coordination.  No recent trauma.  She feels more anxious than usual because her daughter has cancer and is only expected to live a few more months.  She notes that she is already suffered the death of 2 of her sons in the past.  She reports being seen in the ED for dizziness yesterday similar to prior episodes of vertigo that she has had in the past.  She was given meclizine and discharged home at that time.   Past Medical History:  Diagnosis Date  . Asthma   . Atrial fibrillation ([place])   . Breast cancer ([place]) 2011  . Bronchitis 04/2015  . Cancer [facility]) 2011   breast  . COPD (chronic obstructive pulmonary disease) ([place])   . Hypertension   . Shingles    10/2015     Patient Active Problem List   Diagnosis Date Noted  . Acute encephalopathy 08/07/2019  . Fluid overload 06/09/2019  . Bronchitis 05/31/2019  . Hypothyroidism 05/31/2019  . Duodenum ulcer   . Melena   . GI bleed 02/17/2019  . Hypertensive encephalopathy 12/11/2018  . Hypoxia 05/30/2018  . Leukocytosis 04/05/2018  . Pneumonia 11/15/2017  . Pleural effusion 05/16/2017  . AF (paroxysmal atrial fibrillation) ([place]) 05/16/2017  . Breast cancer ([place]) 05/16/2017  . Dependence on  nocturnal oxygen therapy 05/16/2017  . HTN (hypertension) 05/16/2017  . Generalized weakness 03/07/2017  . Hyponatremia 03/07/2017  . Dehydration 03/07/2017  . UTI (urinary tract infection) 03/07/2017  . HCAP (healthcare-associated pneumonia) 01/19/2017  . AKI (acute kidney injury) ([place]) 12/18/2016  . Primary cancer of upper outer quadrant of right female breast ([place]) 04/07/2016  . Lumbar radiculopathy 03/23/2016  . Spinal stenosis, lumbar region, with neurogenic claudication 03/23/2016  . DDD (degenerative disc disease), lumbar 01/14/2015  . Lumbosacral facet joint syndrome 01/14/2015  . Sacroiliac joint dysfunction 01/14/2015  . Greater trochanteric bursitis 01/14/2015  . Bilateral occipital neuralgia 01/14/2015     Past Surgical History:  Procedure Laterality Date  . BREAST EXCISIONAL BIOPSY Right 11/29/13   two areas FAT NECROSIS  . BREAST EXCISIONAL BIOPSY Right 10/30/2009   lumpectomy - radiation  . COLONOSCOPY WITH PROPOFOL N/A 06/10/2018   Procedure: COLONOSCOPY WITH PROPOFOL;  Surgeon: Manya Silvas, MD;  Location: [facility] ENDOSCOPY;  Service: Endoscopy;  Laterality: N/A;  . COLONOSCOPY WITH PROPOFOL N/A 02/20/2019   Procedure: COLONOSCOPY WITH PROPOFOL;  Surgeon: Lucilla Lame, MD;  Location: [facility] ENDOSCOPY;  Service: Endoscopy;  Laterality: N/A;  . ESOPHAGOGASTRODUODENOSCOPY (EGD) WITH PROPOFOL N/A 06/10/2018   Procedure: ESOPHAGOGASTRODUODENOSCOPY (EGD) WITH PROPOFOL;  Surgeon: Manya Silvas, MD;  Location: [facility] ENDOSCOPY;  Service: Endoscopy;  Laterality: N/A;  . ESOPHAGOGASTRODUODENOSCOPY (EGD) WITH PROPOFOL N/A 02/20/2019   Procedure: ESOPHAGOGASTRODUODENOSCOPY (EGD) WITH PROPOFOL;  Surgeon: Lucilla Lame, MD;  Location: [facility] ENDOSCOPY;  Service: Endoscopy;  Laterality: N/A;  . NASAL SINUS SURGERY       Prior to Admission medications   Medication Sig Start Date End Date Taking? Authorizing Provider  ALPRAZolam Duanne Moron) 0.5 MG tablet Take 0.5 mg by mouth 2 (two) times  a day.    Yes [provider]  atorvastatin (LIPITOR) 40 MG tablet Take 40 mg by mouth daily. 03/17/19  Yes [provider]  busPIRone (BUSPAR) 10 MG tablet Take 10 mg by mouth 2 (two) times daily.   Yes [provider]  cholecalciferol (VITAMIN D) 1000 units tablet Take 1,000 Units by mouth daily.   Yes [provider]  cloNIDine (CATAPRES) 0.1 MG tablet Take 1 tablet (0.1 mg total) by mouth 3 (three) times daily. 08/08/19  Yes Nicole Kindred A, DO  famotidine (PEPCID) 20 MG tablet Take 20 mg by mouth 2 (two) times daily. 07/29/19  Yes [provider]  ferrous sulfate 325 (65 FE) MG tablet Take 325 mg by mouth daily with breakfast.   Yes [provider]  fluticasone (FLONASE) 50 MCG/ACT nasal spray Place 2 sprays into both nostrils daily. 10/17/19  Yes [provider]  Fluticasone-Salmeterol (ADVAIR) 250-50 MCG/DOSE AEPB Inhale 1 puff into the lungs 2 (two) times daily.   Yes [provider]  furosemide (LASIX) 20 MG tablet Take 1 tablet (20 mg total) by mouth daily for 7 days. Patient taking differently: Take 20 mg by mouth every other day.  10/21/19 10/30/19 Yes Arta Silence, MD  gabapentin (NEURONTIN) 300 MG capsule Take 300 mg by mouth daily.   Yes [provider]  hydrALAZINE (APRESOLINE) 50 MG tablet Take 50 mg by mouth 3 (three) times daily. 07/10/19  Yes [provider]  levothyroxine (SYNTHROID, LEVOTHROID) 50 MCG tablet Take 50 mcg by mouth daily before breakfast.   Yes [provider]  loratadine (CLARITIN) 10 MG tablet Take 10 mg by mouth daily.   Yes [provider]  losartan (COZAAR) 50 MG tablet Take 2 tablets (100 mg total) by mouth at bedtime. Patient taking differently: Take 100 mg by mouth daily.  12/13/18  Yes Wieting, Richard, MD  magnesium oxide (MAG-OX) 400 MG tablet Take 400 mg by mouth 2 (two) times daily.   Yes [provider]  RABEprazole (ACIPHEX) 20 MG  tablet Take 20 mg by mouth 2 (two) times daily.   Yes [provider]  rOPINIRole (REQUIP) 1 MG tablet Take 1 mg by mouth at bedtime. 10/17/19  Yes [provider]  simethicone (MYLICON) 80 MG chewable tablet Chew 80 mg by mouth 2 (two) times daily.   Yes [provider]  venlafaxine XR (EFFEXOR-XR) 75 MG 24 hr capsule Take 75 mg by mouth daily.   Yes [provider]  albuterol (ACCUNEB) 1.25 MG/3ML nebulizer solution Inhale 3 mLs into the lungs every 6 (six) hours as needed for wheezing.     [provider]  albuterol (PROVENTIL HFA;VENTOLIN HFA) 108 (90 Base) MCG/ACT inhaler Inhale 2 puffs into the lungs every 6 (six) hours as needed for wheezing or shortness of breath.     [provider]  alum & mag hydroxide-simeth (MAALOX/MYLANTA) 200-200-20 MG/5ML suspension Take 30 mLs by mouth every 4 (four) hours as needed for indigestion or heartburn. 06/08/19   Demetrios Loll, MD  azithromycin (ZITHROMAX Z-PAK) 250 MG tablet Take 2 tablets (500 mg) on  Day 1,  followed by 1 tablet (250 mg) once daily on Days 2 through 5. Patient not  taking: Reported on 10/30/2019 09/18/19   Duffy Bruce, MD  benzonatate (TESSALON PERLES) 100 MG capsule Take 1 capsule (100 mg total) by mouth 3 (three) times daily as needed for cough. Patient not taking: Reported on 10/30/2019 08/25/19 08/24/20  Blake Divine, MD  benzonatate (TESSALON PERLES) 100 MG capsule Take 1 capsule (100 mg total) by mouth 3 (three) times daily as needed for cough. Patient not taking: Reported on 10/30/2019 09/18/19 09/17/20  Duffy Bruce, MD  diltiazem (CARDIZEM CD) 180 MG 24 hr capsule Take 1 capsule (180 mg total) by mouth daily. Patient not taking: Reported on 10/30/2019 08/08/19   Nicole Kindred A, DO  HYDROcodone-acetaminophen (NORCO/VICODIN) 5-325 MG tablet Take 1 tablet by mouth 2 (two) times daily.  04/26/19   [provider]  metoprolol succinate (TOPROL-XL) 25 MG 24 hr tablet Take 1 tablet  (25 mg total) by mouth daily. Patient not taking: Reported on 10/30/2019 06/10/19   Loletha Grayer, MD  polyethylene glycol (MIRALAX / GLYCOLAX) 17 g packet Take 17 g by mouth daily. 01/03/19   Earleen Newport, MD     Allergies Diphenhydramine hcl, Penicillins, Captopril, Eliquis [apixaban], Enalapril maleate, Tape, Terfenadine, Augmentin [amoxicillin-pot clavulanate], and Biaxin [clarithromycin]   Family History  Problem Relation Age of Onset  . Hypertension Mother   . Heart disease Mother   . Cancer Mother   . Stroke Father   . Hypertension Father   . Breast cancer Sister     Social History Social History   Tobacco Use  . Smoking status: Never Smoker  . Smokeless tobacco: Never Used  Substance Use Topics  . Alcohol use: No    Alcohol/week: 0.0 standard drinks  . Drug use: No    Review of Systems  Constitutional:   No fever or chills.  ENT:   No sore throat. No rhinorrhea. Cardiovascular:   No chest pain or syncope. Respiratory:   No dyspnea or cough. Gastrointestinal:   Negative for abdominal pain, vomiting and diarrhea.  Musculoskeletal:   Negative for focal pain or swelling All other systems reviewed and are negative except as documented above in ROS and HPI.  ____________________________________________   PHYSICAL EXAM:  VITAL SIGNS: ED Triage Vitals  Enc Vitals Group     BP 10/30/19 0446 (!) 192/91     Pulse Rate 10/30/19 0446 86     Resp 10/30/19 0446 18     Temp 10/30/19 0446 98.6 F (37 C)     Temp Source 10/30/19 0446 Oral     SpO2 10/30/19 0446 97 %     Weight 10/30/19 0447 161 lb (73 kg)     Height 10/30/19 0447 5' (1.524 m)     Head Circumference --      Peak Flow --      Pain Score 10/30/19 0446 8     Pain Loc --      Pain Edu? --      Excl. in [place]? --     Vital signs reviewed, nursing assessments reviewed.   Constitutional:   Alert and oriented. Non-toxic appearance. Eyes:   Conjunctivae are normal. EOMI. PERRL.  No  nystagmus ENT      Head:   Normocephalic and atraumatic.      Nose:   Normal, vascular injection of right nasal septum      Mouth/Throat: Dry mucous membranes.      Neck:   No meningismus. Full ROM. Hematological/Lymphatic/Immunilogical:   No cervical lymphadenopathy. Cardiovascular:   Irregularly irregular rhythm, heart  rate 70-90, atrial fibrillation on the monitor. Symmetric bilateral radial and DP pulses.  No murmurs. Cap refill less than 2 seconds. Respiratory:   Normal respiratory effort without tachypnea/retractions. Breath sounds are clear and equal bilaterally. No wheezes/rales/rhonchi. Gastrointestinal:   Soft and nontender. Non distended. There is no CVA tenderness.  No rebound, rigidity, or guarding.  Musculoskeletal:   Normal range of motion in all extremities. No joint effusions.  No lower extremity tenderness.  No edema. Neurologic:   Normal speech and language.  Cranial nerves III through XII intact Motor grossly intact.  Normal cerebellar function No acute focal neurologic deficits are appreciated.  Skin:    Skin is warm, dry and intact. No rash noted.  No petechiae, purpura, or bullae.  ____________________________________________    LABS (pertinent positives/negatives) (all labs ordered are listed, but only abnormal results are displayed) Labs Reviewed  BASIC METABOLIC PANEL - Abnormal; Notable for the following components:      Result Value   Sodium 132 (*)    Chloride 94 (*)    Glucose, Bld 119 (*)    GFR calc non Af Amer 56 (*)    All other components within normal limits  CBC WITH DIFFERENTIAL/PLATELET  TROPONIN I (HIGH SENSITIVITY)   ____________________________________________   EKG  Interpreted by me Atrial fibrillation, rate of 84.  Left axis, normal intervals.  Poor R wave progression.  Normal ST segments and T waves.  1 PVC on the strip.  ____________________________________________    RADIOLOGY  CT Head Wo Contrast  Result Date:  10/30/2019 CLINICAL DATA:  Hypertension with headache EXAM: CT HEAD WITHOUT CONTRAST TECHNIQUE: Contiguous axial images were obtained from the base of the skull through the vertex without intravenous contrast. COMPARISON:  Head CT August 07, 2019 FINDINGS: Brain: Mild diffuse atrophy is stable. There is no evident intracranial mass, hemorrhage, extra-axial fluid collection, or midline shift. There is relatively mild patchy small vessel disease in the centra semiovale bilaterally, stable. There is no evident acute infarct. Vascular: There is no hyperdense vessel. There is calcification in each carotid siphon region. Skull: The bony calvarium appears intact. Sinuses/Orbits: Visualized paranasal sinuses are clear. Visualized orbits appear symmetric bilaterally. Other: Mastoid air cells are clear. IMPRESSION: Stable mild atrophy with periventricular small vessel disease. No evident acute infarct. No mass or hemorrhage. There are foci of arterial vascular calcification. Electronically Signed   By: Lowella Grip III M.D.   On: 10/30/2019 08:44    ____________________________________________   PROCEDURES Procedures  ____________________________________________  DIFFERENTIAL DIAGNOSIS   Cerebral hemorrhage, anxiety, essential hypertension  CLINICAL IMPRESSION / ASSESSMENT AND PLAN / ED COURSE  Medications ordered in the ED: Medications  cloNIDine (CATAPRES) tablet 0.1 mg (0.1 mg Oral Given 10/30/19 0758)  hydrALAZINE (APRESOLINE) tablet 50 mg (50 mg Oral Given 10/30/19 0758)  ALPRAZolam Duanne Moron) tablet 0.5 mg (0.5 mg Oral Given 10/30/19 [id number])    Pertinent labs & imaging results that were available during my care of the patient were reviewed by me and considered in my medical decision making (see chart for details).  Janice Perkins was evaluated in Emergency Department on 10/30/2019 for the symptoms described in the history of present illness. She was evaluated in the context of the global COVID-19  pandemic, which necessitated consideration that the patient might be at risk for infection with the SARS-CoV-2 virus that causes COVID-19. Institutional protocols and algorithms that pertain to the evaluation of patients at risk for COVID-19 are in a state of rapid change based on  information released by regulatory bodies including the CDC and federal and state organizations. These policies and algorithms were followed during the patient's care in the ED.   Patient presents with hypertension and diaphoresis and mild headache, not thunderclap.  Doubt aneurysm, SAH, ED H, SDH, temporal arteritis, meningitis, encephalitis, intracranial hypertension.  Possible cerebral hemorrhage.  Doubt hyperthyroidism, pheochromocytoma or other endocrine crisis, no evidence of ACS PE dissection.  We will check CT head, give her home meds clonidine and hydralazine.  Also a dose of her home Xanax.  Her benzodiazepine use has been regular lately, doubt withdrawal syndrome.  Clinical Course as of Oct 29 1049  Mon Oct 30, 2019  1042 CT head is unremarkable, blood pressure is normal after giving her home blood pressure medicine.  Stable for discharge home.   [PS]    Clinical Course User Index [PS] Carrie Mew, MD     ____________________________________________   FINAL CLINICAL IMPRESSION(S) / ED DIAGNOSES    Final diagnoses:  Hypertension, unspecified type  Atrial fibrillation, unspecified type ([place])  Chronic obstructive pulmonary disease, unspecified COPD type ([place])  Anxiety     ED Discharge Orders    None      Portions of this note were generated with dragon dictation software. Dictation errors may occur despite best attempts at proofreading.   Carrie Mew, MD 10/30/19 1051

## 2019-10-30 NOTE — ED Triage Notes (Signed)
Pt arrives via [facility] with c/o HTN. Per EMS, BP was 180/110. Pt states that BP has been 200+/100+ at home. Pt was seen yesterday for nausea and DC.

## 2019-10-30 NOTE — ED Notes (Signed)
Pt states that she has Clonidine at home for BP but did not "want to knock it down". This RN asked pt why she did not want to take her BP medication and pt states that she did not see a reason too.

## 2019-10-30 NOTE — ED Notes (Addendum)
Transported to CT 

## 2019-11-03 ENCOUNTER — Other Ambulatory Visit: Payer: Self-pay

## 2019-11-03 ENCOUNTER — Ambulatory Visit
Admission: RE | Admit: 2019-11-03 | Discharge: 2019-11-03 | Disposition: A | Discharge status: Home / Self Care | Payer: Medicare Other | Source: Ambulatory Visit | Attending: Student | Admitting: Student

## 2019-11-03 ENCOUNTER — Ambulatory Visit
Admission: RE | Admit: 2019-11-03 | Discharge: 2019-11-03 | Disposition: A | Discharge status: Home / Self Care | Payer: Medicare Other | Attending: Student | Admitting: Student

## 2019-11-03 ENCOUNTER — Other Ambulatory Visit: Payer: Self-pay | Admitting: Student

## 2019-11-03 DIAGNOSIS — K5909 Other constipation: Secondary | ICD-10-CM

## 2019-11-06 MED ORDER — FAMOTIDINE 20 MG PO TABS
20.00 | ORAL_TABLET | ORAL | Status: DC
Start: 2019-11-06 — End: 2019-11-06

## 2019-11-06 MED ORDER — ONDANSETRON 4 MG PO TBDP
4.00 | ORAL_TABLET | ORAL | Status: DC
Start: ? — End: 2019-11-06

## 2019-11-06 MED ORDER — ASPIRIN 81 MG PO CHEW
81.00 | CHEWABLE_TABLET | ORAL | Status: DC
Start: 2019-11-06 — End: 2019-11-06

## 2019-11-06 MED ORDER — PANTOPRAZOLE SODIUM 20 MG PO TBEC
20.00 | DELAYED_RELEASE_TABLET | ORAL | Status: DC
Start: 2019-11-06 — End: 2019-11-06

## 2019-11-06 MED ORDER — BUSPIRONE HCL 10 MG PO TABS
10.00 | ORAL_TABLET | ORAL | Status: DC
Start: 2019-11-05 — End: 2019-11-06

## 2019-11-06 MED ORDER — FLUTICASONE FUROATE-VILANTEROL 100-25 MCG/INH IN AEPB
1.00 | INHALATION_SPRAY | RESPIRATORY_TRACT | Status: DC
Start: 2019-11-06 — End: 2019-11-06

## 2019-11-06 MED ORDER — ENOXAPARIN SODIUM 40 MG/0.4ML ~~LOC~~ SOLN
40.00 | SUBCUTANEOUS | Status: DC
Start: 2019-11-05 — End: 2019-11-06

## 2019-11-06 MED ORDER — FLUOXETINE HCL 20 MG PO CAPS
20.00 | ORAL_CAPSULE | ORAL | Status: DC
Start: 2019-11-06 — End: 2019-11-06

## 2019-11-06 MED ORDER — HYDROCODONE-ACETAMINOPHEN 5-325 MG PO TABS
1.00 | ORAL_TABLET | ORAL | Status: DC
Start: ? — End: 2019-11-06

## 2019-11-06 MED ORDER — LEVOTHYROXINE SODIUM 25 MCG PO TABS
50.00 | ORAL_TABLET | ORAL | Status: DC
Start: 2019-11-06 — End: 2019-11-06

## 2019-11-06 MED ORDER — ACETAMINOPHEN 325 MG PO TABS
650.00 | ORAL_TABLET | ORAL | Status: DC
Start: ? — End: 2019-11-06

## 2019-11-06 MED ORDER — LOSARTAN POTASSIUM 100 MG PO TABS
100.00 | ORAL_TABLET | ORAL | Status: DC
Start: 2019-11-05 — End: 2019-11-06

## 2019-11-06 MED ORDER — HYDRALAZINE HCL 50 MG PO TABS
50.00 | ORAL_TABLET | ORAL | Status: DC
Start: 2019-11-05 — End: 2019-11-06

## 2019-11-06 MED ORDER — ALBUTEROL SULFATE HFA 108 (90 BASE) MCG/ACT IN AERS
2.00 | INHALATION_SPRAY | RESPIRATORY_TRACT | Status: DC
Start: ? — End: 2019-11-06

## 2019-11-06 MED ORDER — ALPRAZOLAM 0.5 MG PO TABS
0.50 | ORAL_TABLET | ORAL | Status: DC
Start: ? — End: 2019-11-06

## 2019-11-06 MED ORDER — ATORVASTATIN CALCIUM 40 MG PO TABS
40.00 | ORAL_TABLET | ORAL | Status: DC
Start: 2019-11-05 — End: 2019-11-06

## 2019-11-08 ENCOUNTER — Emergency Department
Admission: EM | Admit: 2019-11-08 | Discharge: 2019-11-08 | Disposition: A | Discharge status: Home / Self Care | Payer: Medicare Other | Attending: Emergency Medicine | Admitting: Emergency Medicine

## 2019-11-08 ENCOUNTER — Other Ambulatory Visit: Payer: Self-pay

## 2019-11-08 ENCOUNTER — Encounter: Payer: Self-pay | Admitting: Emergency Medicine

## 2019-11-08 DIAGNOSIS — J449 Chronic obstructive pulmonary disease, unspecified: Secondary | ICD-10-CM | POA: Diagnosis not present

## 2019-11-08 DIAGNOSIS — E039 Hypothyroidism, unspecified: Secondary | ICD-10-CM | POA: Diagnosis not present

## 2019-11-08 DIAGNOSIS — E871 Hypo-osmolality and hyponatremia: Secondary | ICD-10-CM | POA: Diagnosis not present

## 2019-11-08 DIAGNOSIS — I4891 Unspecified atrial fibrillation: Secondary | ICD-10-CM | POA: Insufficient documentation

## 2019-11-08 DIAGNOSIS — I10 Essential (primary) hypertension: Secondary | ICD-10-CM | POA: Diagnosis not present

## 2019-11-08 DIAGNOSIS — Z853 Personal history of malignant neoplasm of breast: Secondary | ICD-10-CM | POA: Diagnosis not present

## 2019-11-08 DIAGNOSIS — R531 Weakness: Secondary | ICD-10-CM | POA: Diagnosis present

## 2019-11-08 DIAGNOSIS — Z79899 Other long term (current) drug therapy: Secondary | ICD-10-CM | POA: Diagnosis not present

## 2019-11-08 LAB — COMPREHENSIVE METABOLIC PANEL
ALT: 26 U/L (ref 0–44)
AST: 36 U/L (ref 15–41)
Albumin: 4.3 g/dL (ref 3.5–5.0)
Alkaline Phosphatase: 40 U/L (ref 38–126)
Anion gap: 11 (ref 5–15)
BUN: 13 mg/dL (ref 8–23)
CO2: 23 mmol/L (ref 22–32)
Calcium: 9.5 mg/dL (ref 8.9–10.3)
Chloride: 92 mmol/L — ABNORMAL LOW (ref 98–111)
Creatinine, Ser: 0.82 mg/dL (ref 0.44–1.00)
GFR calc Af Amer: >60 mL/min (ref >60)
GFR calc non Af Amer: >60 mL/min (ref >60)
Glucose, Bld: 118 mg/dL — ABNORMAL HIGH (ref 70–99)
Potassium: 3.9 mmol/L (ref 3.5–5.1)
Sodium: 126 mmol/L — ABNORMAL LOW (ref 135–145)
Total Bilirubin: 1.1 mg/dL (ref 0.3–1.2)
Total Protein: 7.4 g/dL (ref 6.5–8.1)

## 2019-11-08 LAB — URINALYSIS, COMPLETE (UACMP) WITH MICROSCOPIC
Bilirubin Urine: NEGATIVE
Glucose, UA: NEGATIVE mg/dL
Hgb urine dipstick: NEGATIVE
Ketones, ur: NEGATIVE mg/dL
Nitrite: NEGATIVE
Protein, ur: NEGATIVE mg/dL
Specific Gravity, Urine: 1.011 (ref 1.005–1.030)
pH: 6 (ref 5.0–8.0)

## 2019-11-08 LAB — CBC
HCT: 34.6 % — ABNORMAL LOW (ref 36.0–46.0)
Hemoglobin: 12.1 g/dL (ref 12.0–15.0)
MCH: 31.5 pg (ref 26.0–34.0)
MCHC: 35 g/dL (ref 30.0–36.0)
MCV: 90.1 fL (ref 80.0–100.0)
Platelets: 280 10*3/uL (ref 150–400)
RBC: 3.84 MIL/uL — ABNORMAL LOW (ref 3.87–5.11)
RDW: 13.1 % (ref 11.5–15.5)
WBC: 8.8 10*3/uL (ref 4.0–10.5)
nRBC: 0 % (ref 0.0–0.2)

## 2019-11-08 MED ORDER — SODIUM CHLORIDE 0.9 % IV BOLUS
500.0000 mL | Freq: Once | INTRAVENOUS | Status: AC
  Administered 2019-11-08: 500 mL via INTRAVENOUS

## 2019-11-08 NOTE — ED Notes (Signed)
Pt ambulated to the restroom, standby assist. NAD noted

## 2019-11-08 NOTE — ED Notes (Signed)
See triage note, pt states she was sent to ED for abnormal labs.  Pt placed on cardiac montior

## 2019-11-08 NOTE — Discharge Instructions (Addendum)
As we discussed pleases start taking the salt tablets prescribed by your primary care physician. Please seek medical attention for any high fevers, chest pain, shortness of breath, change in behavior, persistent vomiting, bloody stool or any other new or concerning symptoms.

## 2019-11-08 NOTE — ED Triage Notes (Signed)
Pt reports has had some issues with her sodium level dropping and was advised to come to the ED for evaluation of her Na levels. Pt c/o weakness as well.

## 2019-11-08 NOTE — ED Provider Notes (Signed)
[facility] Emergency Department Provider Note   ____________________________________________   I have reviewed the triage vital signs and the nursing notes.   HISTORY  Chief Complaint Abnormal Lab and Weakness   History limited by: Not Limited   HPI Janice Perkins is a 86 y.o. female who presents to the emergency department today because of concerns for low sodium levels.  Patient states she has a history of occasionally having low sodium levels.  She did see her primary care doctor yesterday.  She was found to have low sodium again.  Her doctor told her to come to the emergency department if she felt any worse.  Today she says that she started to have chills.  Because of this she did come to the emergency department.  Her doctor had prescribed her salt tablets but she is yet to start them.  Records reviewed. Per medical record review patient has a history of atrial fibrillation  Past Medical History:  Diagnosis Date  . Asthma   . Atrial fibrillation ([place])   . Breast cancer ([place]) 2011  . Bronchitis 04/2015  . Cancer [facility]) 2011   breast  . COPD (chronic obstructive pulmonary disease) ([place])   . Hypertension   . Shingles    10/2015    Patient Active Problem List   Diagnosis Date Noted  . Acute encephalopathy 08/07/2019  . Fluid overload 06/09/2019  . Bronchitis 05/31/2019  . Hypothyroidism 05/31/2019  . Duodenum ulcer   . Melena   . GI bleed 02/17/2019  . Hypertensive encephalopathy 12/11/2018  . Hypoxia 05/30/2018  . Leukocytosis 04/05/2018  . Pneumonia 11/15/2017  . Pleural effusion 05/16/2017  . AF (paroxysmal atrial fibrillation) ([place]) 05/16/2017  . Breast cancer ([place]) 05/16/2017  . Dependence on nocturnal oxygen therapy 05/16/2017  . HTN (hypertension) 05/16/2017  . Generalized weakness 03/07/2017  . Hyponatremia 03/07/2017  . Dehydration 03/07/2017  . UTI (urinary tract infection) 03/07/2017  . HCAP (healthcare-associated pneumonia)  01/19/2017  . AKI (acute kidney injury) ([place]) 12/18/2016  . Primary cancer of upper outer quadrant of right female breast ([place]) 04/07/2016  . Lumbar radiculopathy 03/23/2016  . Spinal stenosis, lumbar region, with neurogenic claudication 03/23/2016  . DDD (degenerative disc disease), lumbar 01/14/2015  . Lumbosacral facet joint syndrome 01/14/2015  . Sacroiliac joint dysfunction 01/14/2015  . Greater trochanteric bursitis 01/14/2015  . Bilateral occipital neuralgia 01/14/2015    Past Surgical History:  Procedure Laterality Date  . BREAST EXCISIONAL BIOPSY Right 11/29/13   two areas FAT NECROSIS  . BREAST EXCISIONAL BIOPSY Right 10/30/2009   lumpectomy - radiation  . COLONOSCOPY WITH PROPOFOL N/A 06/10/2018   Procedure: COLONOSCOPY WITH PROPOFOL;  Surgeon: Manya Silvas, MD;  Location: [facility] ENDOSCOPY;  Service: Endoscopy;  Laterality: N/A;  . COLONOSCOPY WITH PROPOFOL N/A 02/20/2019   Procedure: COLONOSCOPY WITH PROPOFOL;  Surgeon: Lucilla Lame, MD;  Location: [facility] ENDOSCOPY;  Service: Endoscopy;  Laterality: N/A;  . ESOPHAGOGASTRODUODENOSCOPY (EGD) WITH PROPOFOL N/A 06/10/2018   Procedure: ESOPHAGOGASTRODUODENOSCOPY (EGD) WITH PROPOFOL;  Surgeon: Manya Silvas, MD;  Location: [facility] ENDOSCOPY;  Service: Endoscopy;  Laterality: N/A;  . ESOPHAGOGASTRODUODENOSCOPY (EGD) WITH PROPOFOL N/A 02/20/2019   Procedure: ESOPHAGOGASTRODUODENOSCOPY (EGD) WITH PROPOFOL;  Surgeon: Lucilla Lame, MD;  Location: [facility] ENDOSCOPY;  Service: Endoscopy;  Laterality: N/A;  . NASAL SINUS SURGERY      Prior to Admission medications   Medication Sig Start Date End Date Taking? Authorizing Provider  albuterol (ACCUNEB) 1.25 MG/3ML nebulizer solution Inhale 3 mLs into the lungs every  6 (six) hours as needed for wheezing.     [provider]  albuterol (PROVENTIL HFA;VENTOLIN HFA) 108 (90 Base) MCG/ACT inhaler Inhale 2 puffs into the lungs every 6 (six) hours as needed for wheezing or shortness of breath.      [provider]  ALPRAZolam Duanne Moron) 0.5 MG tablet Take 0.5 mg by mouth 2 (two) times a day.     [provider]  alum & mag hydroxide-simeth (MAALOX/MYLANTA) 200-200-20 MG/5ML suspension Take 30 mLs by mouth every 4 (four) hours as needed for indigestion or heartburn. 06/08/19   Demetrios Loll, MD  atorvastatin (LIPITOR) 40 MG tablet Take 40 mg by mouth daily. 03/17/19   [provider]  azithromycin (ZITHROMAX Z-PAK) 250 MG tablet Take 2 tablets (500 mg) on  Day 1,  followed by 1 tablet (250 mg) once daily on Days 2 through 5. Patient not taking: Reported on 10/30/2019 09/18/19   Duffy Bruce, MD  benzonatate (TESSALON PERLES) 100 MG capsule Take 1 capsule (100 mg total) by mouth 3 (three) times daily as needed for cough. Patient not taking: Reported on 10/30/2019 08/25/19 08/24/20  Blake Divine, MD  benzonatate (TESSALON PERLES) 100 MG capsule Take 1 capsule (100 mg total) by mouth 3 (three) times daily as needed for cough. Patient not taking: Reported on 10/30/2019 09/18/19 09/17/20  Duffy Bruce, MD  busPIRone (BUSPAR) 10 MG tablet Take 10 mg by mouth 2 (two) times daily.    [provider]  cholecalciferol (VITAMIN D) 1000 units tablet Take 1,000 Units by mouth daily.    [provider]  cloNIDine (CATAPRES) 0.1 MG tablet Take 1 tablet (0.1 mg total) by mouth 3 (three) times daily. 08/08/19   Ezekiel Slocumb, DO  diltiazem (CARDIZEM CD) 180 MG 24 hr capsule Take 1 capsule (180 mg total) by mouth daily. Patient not taking: Reported on 10/30/2019 08/08/19   Nicole Kindred A, DO  famotidine (PEPCID) 20 MG tablet Take 20 mg by mouth 2 (two) times daily. 07/29/19   [provider]  ferrous sulfate 325 (65 FE) MG tablet Take 325 mg by mouth daily with breakfast.    [provider]  fluticasone (FLONASE) 50 MCG/ACT nasal spray Place 2 sprays into both nostrils daily. 10/17/19   [provider]  Fluticasone-Salmeterol (ADVAIR) 250-50  MCG/DOSE AEPB Inhale 1 puff into the lungs 2 (two) times daily.    [provider]  furosemide (LASIX) 20 MG tablet Take 1 tablet (20 mg total) by mouth daily for 7 days. Patient taking differently: Take 20 mg by mouth every other day.  10/21/19 10/30/19  Arta Silence, MD  gabapentin (NEURONTIN) 300 MG capsule Take 300 mg by mouth daily.    [provider]  hydrALAZINE (APRESOLINE) 50 MG tablet Take 50 mg by mouth 3 (three) times daily. 07/10/19   [provider]  HYDROcodone-acetaminophen (NORCO/VICODIN) 5-325 MG tablet Take 1 tablet by mouth 2 (two) times daily.  04/26/19   [provider]  levothyroxine (SYNTHROID, LEVOTHROID) 50 MCG tablet Take 50 mcg by mouth daily before breakfast.    [provider]  loratadine (CLARITIN) 10 MG tablet Take 10 mg by mouth daily.    [provider]  losartan (COZAAR) 50 MG tablet Take 2 tablets (100 mg total) by mouth at bedtime. Patient taking differently: Take 100 mg by mouth daily.  12/13/18   Loletha Grayer, MD  magnesium oxide (MAG-OX) 400 MG tablet Take 400 mg by mouth 2 (two) times daily.  [provider]  metoprolol succinate (TOPROL-XL) 25 MG 24 hr tablet Take 1 tablet (25 mg total) by mouth daily. Patient not taking: Reported on 10/30/2019 06/10/19   Loletha Grayer, MD  polyethylene glycol (MIRALAX / GLYCOLAX) 17 g packet Take 17 g by mouth daily. 01/03/19   Earleen Newport, MD  RABEprazole (ACIPHEX) 20 MG tablet Take 20 mg by mouth 2 (two) times daily.    [provider]  rOPINIRole (REQUIP) 1 MG tablet Take 1 mg by mouth at bedtime. 10/17/19   [provider]  simethicone (MYLICON) 80 MG chewable tablet Chew 80 mg by mouth 2 (two) times daily.    [provider]  venlafaxine XR (EFFEXOR-XR) 75 MG 24 hr capsule Take 75 mg by mouth daily.    [provider]    Allergies Diphenhydramine hcl, Penicillins, Captopril, Eliquis [apixaban],  Enalapril maleate, Tape, Terfenadine, Augmentin [amoxicillin-pot clavulanate], and Biaxin [clarithromycin]  Family History  Problem Relation Age of Onset  . Hypertension Mother   . Heart disease Mother   . Cancer Mother   . Stroke Father   . Hypertension Father   . Breast cancer Sister     Social History Social History   Tobacco Use  . Smoking status: Never Smoker  . Smokeless tobacco: Never Used  Substance Use Topics  . Alcohol use: No    Alcohol/week: 0.0 standard drinks  . Drug use: No    Review of Systems Constitutional: Positive for chills.  Eyes: No visual changes. ENT: No sore throat. Cardiovascular: Denies chest pain. Respiratory: Denies shortness of breath. Gastrointestinal: No abdominal pain.  No nausea, no vomiting.  No diarrhea.   Genitourinary: Negative for dysuria. Musculoskeletal: Negative for back pain. Skin: Negative for rash. Neurological: Negative for headaches, focal weakness or numbness.  ____________________________________________   PHYSICAL EXAM:  VITAL SIGNS: ED Triage Vitals  Enc Vitals Group     BP 11/08/19 1549 (!) 150/75     Pulse Rate 11/08/19 1549 68     Resp 11/08/19 1549 20     Temp 11/08/19 1549 98.7 F (37.1 C)     Temp Source 11/08/19 1549 Oral     SpO2 11/08/19 1549 99 %     Weight 11/08/19 1547 160 lb (72.6 kg)     Height 11/08/19 1547 5' (1.524 m)     Head Circumference --      Peak Flow --      Pain Score 11/08/19 1547 0   Constitutional: Alert and oriented.  Eyes: Conjunctivae are normal.  ENT      Head: Normocephalic and atraumatic.      Nose: No congestion/rhinnorhea.      Mouth/Throat: Mucous membranes are moist.      Neck: No stridor. Hematological/Lymphatic/Immunilogical: No cervical lymphadenopathy. Cardiovascular: Irregular rhythm, regular rate.  No murmurs, rubs, or gallops.  Respiratory: Normal respiratory effort without tachypnea nor retractions. Breath sounds are clear and equal bilaterally. No  wheezes/rales/rhonchi. Gastrointestinal: Soft and non tender. No rebound. No guarding.  Genitourinary: Deferred Musculoskeletal: Normal range of motion in all extremities. No lower extremity edema. Neurologic:  Normal speech and language. No gross focal neurologic deficits are appreciated.  Skin:  Skin is warm, dry and intact. No rash noted. Psychiatric: Mood and affect are normal. Speech and behavior are normal. Patient exhibits appropriate insight and judgment.  ____________________________________________    LABS (pertinent positives/negatives)  CMP na 126, k 3.9, glu 118, cr 0.82 UA hazy, large leukocytes, 0-5 rbc and wbc CBC wbc  8.8, hgb 12.1, plt 280  ____________________________________________   EKG  I, Nance Pear, attending physician, personally viewed and interpreted this EKG  EKG Time: 1553 Rate: 72 Rhythm: atrial fibrillation Axis: left axis deviation Intervals: qtc 420 QRS: narrow, q waves v1, v2 ST changes: no st elevation Impression: abnormal ekg  ____________________________________________    RADIOLOGY  None  ____________________________________________   PROCEDURES  Procedures  ____________________________________________   INITIAL IMPRESSION / ASSESSMENT AND PLAN / ED COURSE  Pertinent labs & imaging results that were available during my care of the patient were reviewed by me and considered in my medical decision making (see chart for details).   Patient presented to the emergency department today because of concerns for some chills in the setting of low sodium level.  Patient sodium level today again 126.  Rest of her work-up without any concerning findings.  She was given some sodium chloride through an IV.  This point I think is reasonable for patient to be discharged home.  Doubt infectious etiology the patient's symptoms.  Did discuss with the patient importance of starting the salt tablets prescribed by her primary care doctor.   Discussed with patient portance of follow-up for repeat sodium levels.  ___________________________________________   FINAL CLINICAL IMPRESSION(S) / ED DIAGNOSES  Final diagnoses:  Hyponatremia     Note: This dictation was prepared with Dragon dictation. Any transcriptional errors that result from this process are unintentional     Nance Pear, MD 11/08/19 2145

## 2019-11-11 ENCOUNTER — Emergency Department: Payer: Medicare Other

## 2019-11-11 ENCOUNTER — Other Ambulatory Visit: Payer: Self-pay

## 2019-11-11 ENCOUNTER — Observation Stay
Admission: EM | Admit: 2019-11-11 | Discharge: 2019-11-12 | Disposition: A | Discharge status: Home / Self Care | Payer: Medicare Other | Attending: Internal Medicine | Admitting: Internal Medicine

## 2019-11-11 DIAGNOSIS — J45909 Unspecified asthma, uncomplicated: Secondary | ICD-10-CM | POA: Diagnosis not present

## 2019-11-11 DIAGNOSIS — Z79890 Hormone replacement therapy (postmenopausal): Secondary | ICD-10-CM | POA: Insufficient documentation

## 2019-11-11 DIAGNOSIS — Z7902 Long term (current) use of antithrombotics/antiplatelets: Secondary | ICD-10-CM | POA: Insufficient documentation

## 2019-11-11 DIAGNOSIS — R7989 Other specified abnormal findings of blood chemistry: Secondary | ICD-10-CM | POA: Insufficient documentation

## 2019-11-11 DIAGNOSIS — J449 Chronic obstructive pulmonary disease, unspecified: Secondary | ICD-10-CM | POA: Diagnosis present

## 2019-11-11 DIAGNOSIS — Z79899 Other long term (current) drug therapy: Secondary | ICD-10-CM | POA: Insufficient documentation

## 2019-11-11 DIAGNOSIS — Z7951 Long term (current) use of inhaled steroids: Secondary | ICD-10-CM | POA: Diagnosis not present

## 2019-11-11 DIAGNOSIS — R079 Chest pain, unspecified: Secondary | ICD-10-CM | POA: Diagnosis not present

## 2019-11-11 DIAGNOSIS — I160 Hypertensive urgency: Secondary | ICD-10-CM | POA: Diagnosis present

## 2019-11-11 DIAGNOSIS — Z853 Personal history of malignant neoplasm of breast: Secondary | ICD-10-CM | POA: Insufficient documentation

## 2019-11-11 DIAGNOSIS — Z20822 Contact with and (suspected) exposure to covid-19: Secondary | ICD-10-CM | POA: Insufficient documentation

## 2019-11-11 DIAGNOSIS — E785 Hyperlipidemia, unspecified: Secondary | ICD-10-CM | POA: Diagnosis present

## 2019-11-11 DIAGNOSIS — E871 Hypo-osmolality and hyponatremia: Secondary | ICD-10-CM

## 2019-11-11 DIAGNOSIS — R778 Other specified abnormalities of plasma proteins: Secondary | ICD-10-CM | POA: Diagnosis not present

## 2019-11-11 DIAGNOSIS — F418 Other specified anxiety disorders: Secondary | ICD-10-CM | POA: Diagnosis present

## 2019-11-11 DIAGNOSIS — I10 Essential (primary) hypertension: Secondary | ICD-10-CM | POA: Diagnosis not present

## 2019-11-11 DIAGNOSIS — K219 Gastro-esophageal reflux disease without esophagitis: Secondary | ICD-10-CM | POA: Diagnosis not present

## 2019-11-11 DIAGNOSIS — I480 Paroxysmal atrial fibrillation: Secondary | ICD-10-CM | POA: Diagnosis not present

## 2019-11-11 DIAGNOSIS — Z7982 Long term (current) use of aspirin: Secondary | ICD-10-CM | POA: Diagnosis not present

## 2019-11-11 DIAGNOSIS — R0789 Other chest pain: Secondary | ICD-10-CM | POA: Diagnosis not present

## 2019-11-11 DIAGNOSIS — E039 Hypothyroidism, unspecified: Secondary | ICD-10-CM | POA: Diagnosis not present

## 2019-11-11 DIAGNOSIS — D509 Iron deficiency anemia, unspecified: Secondary | ICD-10-CM | POA: Diagnosis present

## 2019-11-11 DIAGNOSIS — I4891 Unspecified atrial fibrillation: Secondary | ICD-10-CM | POA: Diagnosis present

## 2019-11-11 LAB — CBC
HCT: 35.4 % — ABNORMAL LOW (ref 36.0–46.0)
Hemoglobin: 12.4 g/dL (ref 12.0–15.0)
MCH: 31.9 pg (ref 26.0–34.0)
MCHC: 35 g/dL (ref 30.0–36.0)
MCV: 91 fL (ref 80.0–100.0)
Platelets: 277 10*3/uL (ref 150–400)
RBC: 3.89 MIL/uL (ref 3.87–5.11)
RDW: 13.2 % (ref 11.5–15.5)
WBC: 7.6 10*3/uL (ref 4.0–10.5)
nRBC: 0 % (ref 0.0–0.2)

## 2019-11-11 LAB — BASIC METABOLIC PANEL
Anion gap: 10 (ref 5–15)
BUN: 21 mg/dL (ref 8–23)
CO2: 25 mmol/L (ref 22–32)
Calcium: 9.4 mg/dL (ref 8.9–10.3)
Chloride: 95 mmol/L — ABNORMAL LOW (ref 98–111)
Creatinine, Ser: 0.84 mg/dL (ref 0.44–1.00)
GFR calc Af Amer: >60 mL/min (ref >60)
GFR calc non Af Amer: >60 mL/min (ref >60)
Glucose, Bld: 119 mg/dL — ABNORMAL HIGH (ref 70–99)
Potassium: 3.9 mmol/L (ref 3.5–5.1)
Sodium: 130 mmol/L — ABNORMAL LOW (ref 135–145)

## 2019-11-11 LAB — TROPONIN I (HIGH SENSITIVITY)
Troponin I (High Sensitivity): 18 ng/L — ABNORMAL HIGH (ref <18)
Troponin I (High Sensitivity): 18 ng/L — ABNORMAL HIGH (ref <18)

## 2019-11-11 LAB — BRAIN NATRIURETIC PEPTIDE: B Natriuretic Peptide: 395 pg/mL — ABNORMAL HIGH (ref 0.0–100.0)

## 2019-11-11 MED ORDER — ENOXAPARIN SODIUM 40 MG/0.4ML ~~LOC~~ SOLN
40.0000 mg | SUBCUTANEOUS | Status: DC
  Administered 2019-11-11: 40 mg via SUBCUTANEOUS
  Filled 2019-11-11: qty 0.4

## 2019-11-11 MED ORDER — HYDRALAZINE HCL 50 MG PO TABS
50.0000 mg | ORAL_TABLET | Freq: Three times a day (TID) | ORAL | Status: DC
  Administered 2019-11-11 – 2019-11-12 (×2): 50 mg via ORAL
  Filled 2019-11-11 (×2): qty 1

## 2019-11-11 MED ORDER — BUSPIRONE HCL 10 MG PO TABS
10.0000 mg | ORAL_TABLET | Freq: Two times a day (BID) | ORAL | Status: DC
  Administered 2019-11-11 – 2019-11-12 (×2): 10 mg via ORAL
  Filled 2019-11-11 (×3): qty 1

## 2019-11-11 MED ORDER — NITROGLYCERIN 0.4 MG SL SUBL: 0.4000 mg | SUBLINGUAL_TABLET | SUBLINGUAL | Status: DC | PRN

## 2019-11-11 MED ORDER — FERROUS SULFATE 325 (65 FE) MG PO TABS
325.0000 mg | ORAL_TABLET | Freq: Every day | ORAL | Status: DC
  Administered 2019-11-12: 325 mg via ORAL
  Filled 2019-11-11: qty 1

## 2019-11-11 MED ORDER — CLONIDINE HCL 0.1 MG PO TABS
0.1000 mg | ORAL_TABLET | Freq: Three times a day (TID) | ORAL | Status: DC
  Administered 2019-11-11 – 2019-11-12 (×2): 0.1 mg via ORAL
  Filled 2019-11-11 (×2): qty 1

## 2019-11-11 MED ORDER — ASPIRIN 81 MG PO CHEW
324.0000 mg | CHEWABLE_TABLET | Freq: Once | ORAL | Status: AC
  Administered 2019-11-11: 324 mg via ORAL
  Filled 2019-11-11: qty 4

## 2019-11-11 MED ORDER — FLUTICASONE PROPIONATE 50 MCG/ACT NA SUSP
2.0000 | Freq: Every day | NASAL | Status: DC
  Administered 2019-11-11 – 2019-11-12 (×2): 2 via NASAL
  Filled 2019-11-11: qty 16

## 2019-11-11 MED ORDER — ALBUTEROL SULFATE (2.5 MG/3ML) 0.083% IN NEBU: 3.0000 mL | INHALATION_SOLUTION | RESPIRATORY_TRACT | Status: DC | PRN

## 2019-11-11 MED ORDER — FLUOXETINE HCL 20 MG PO CAPS
20.0000 mg | ORAL_CAPSULE | Freq: Every day | ORAL | Status: DC
  Administered 2019-11-11 – 2019-11-12 (×2): 20 mg via ORAL
  Filled 2019-11-11 (×2): qty 1

## 2019-11-11 MED ORDER — ONDANSETRON HCL 4 MG/2ML IJ SOLN
4.0000 mg | Freq: Once | INTRAMUSCULAR | Status: AC
  Administered 2019-11-11: 4 mg via INTRAVENOUS
  Filled 2019-11-11: qty 2

## 2019-11-11 MED ORDER — VITAMIN D 25 MCG (1000 UNIT) PO TABS
1000.0000 [IU] | ORAL_TABLET | Freq: Every day | ORAL | Status: DC
  Administered 2019-11-12: 1000 [IU] via ORAL
  Filled 2019-11-11: qty 1

## 2019-11-11 MED ORDER — LOSARTAN POTASSIUM 50 MG PO TABS
100.0000 mg | ORAL_TABLET | Freq: Every day | ORAL | Status: DC
  Administered 2019-11-11 – 2019-11-12 (×2): 100 mg via ORAL
  Filled 2019-11-11 (×2): qty 2

## 2019-11-11 MED ORDER — LORATADINE 10 MG PO TABS
10.0000 mg | ORAL_TABLET | Freq: Every day | ORAL | Status: DC
  Administered 2019-11-11 – 2019-11-12 (×2): 10 mg via ORAL
  Filled 2019-11-11 (×2): qty 1

## 2019-11-11 MED ORDER — ALPRAZOLAM 0.5 MG PO TABS
0.5000 mg | ORAL_TABLET | Freq: Two times a day (BID) | ORAL | Status: DC | PRN
  Administered 2019-11-11 – 2019-11-12 (×3): 0.5 mg via ORAL
  Filled 2019-11-11 (×3): qty 1

## 2019-11-11 MED ORDER — ENOXAPARIN SODIUM 40 MG/0.4ML ~~LOC~~ SOLN: 30.0000 mg | SUBCUTANEOUS | Status: DC

## 2019-11-11 MED ORDER — PANTOPRAZOLE SODIUM 40 MG PO TBEC
40.0000 mg | DELAYED_RELEASE_TABLET | Freq: Every day | ORAL | Status: DC
  Administered 2019-11-11 – 2019-11-12 (×2): 40 mg via ORAL
  Filled 2019-11-11 (×2): qty 1

## 2019-11-11 MED ORDER — ONDANSETRON HCL 4 MG/2ML IJ SOLN: 4.0000 mg | Freq: Three times a day (TID) | INTRAMUSCULAR | Status: DC | PRN

## 2019-11-11 MED ORDER — LEVOTHYROXINE SODIUM 50 MCG PO TABS
50.0000 ug | ORAL_TABLET | Freq: Every day | ORAL | Status: DC
  Administered 2019-11-12: 50 ug via ORAL
  Filled 2019-11-11: qty 1

## 2019-11-11 MED ORDER — MAGNESIUM OXIDE 400 (241.3 MG) MG PO TABS
400.0000 mg | ORAL_TABLET | Freq: Two times a day (BID) | ORAL | Status: DC
  Administered 2019-11-11 – 2019-11-12 (×2): 400 mg via ORAL
  Filled 2019-11-11 (×2): qty 1

## 2019-11-11 MED ORDER — ROPINIROLE HCL 1 MG PO TABS
1.0000 mg | ORAL_TABLET | Freq: Every day | ORAL | Status: DC
  Administered 2019-11-11: 1 mg via ORAL
  Filled 2019-11-11 (×2): qty 1

## 2019-11-11 MED ORDER — FAMOTIDINE 20 MG PO TABS
20.0000 mg | ORAL_TABLET | Freq: Two times a day (BID) | ORAL | Status: DC
  Administered 2019-11-11 – 2019-11-12 (×2): 20 mg via ORAL
  Filled 2019-11-11 (×2): qty 1

## 2019-11-11 MED ORDER — HYDROCODONE-ACETAMINOPHEN 5-325 MG PO TABS
1.0000 | ORAL_TABLET | Freq: Two times a day (BID) | ORAL | Status: DC | PRN
  Administered 2019-11-12: 1 via ORAL
  Filled 2019-11-11: qty 1

## 2019-11-11 MED ORDER — MORPHINE SULFATE (PF) 2 MG/ML IV SOLN: 0.5000 mg | INTRAVENOUS | Status: DC | PRN

## 2019-11-11 MED ORDER — ALUM & MAG HYDROXIDE-SIMETH 200-200-20 MG/5ML PO SUSP: 30.0000 mL | ORAL | Status: DC | PRN

## 2019-11-11 MED ORDER — ACETAMINOPHEN 325 MG PO TABS
650.0000 mg | ORAL_TABLET | Freq: Four times a day (QID) | ORAL | Status: DC | PRN
  Administered 2019-11-12: 650 mg via ORAL
  Filled 2019-11-11: qty 2

## 2019-11-11 MED ORDER — SIMETHICONE 80 MG PO CHEW
80.0000 mg | CHEWABLE_TABLET | Freq: Two times a day (BID) | ORAL | Status: DC | PRN
  Filled 2019-11-11: qty 1

## 2019-11-11 MED ORDER — ATORVASTATIN CALCIUM 20 MG PO TABS
40.0000 mg | ORAL_TABLET | Freq: Every day | ORAL | Status: DC
  Administered 2019-11-11: 40 mg via ORAL
  Filled 2019-11-11: qty 2

## 2019-11-11 MED ORDER — SODIUM CHLORIDE 0.9 % IV SOLN: INTRAVENOUS | Status: DC

## 2019-11-11 MED ORDER — MOMETASONE FURO-FORMOTEROL FUM 200-5 MCG/ACT IN AERO
2.0000 | INHALATION_SPRAY | Freq: Two times a day (BID) | RESPIRATORY_TRACT | Status: DC
  Administered 2019-11-11 – 2019-11-12 (×2): 2 via RESPIRATORY_TRACT
  Filled 2019-11-11: qty 8.8

## 2019-11-11 MED ORDER — ALPRAZOLAM 0.25 MG PO TABS
0.2500 mg | ORAL_TABLET | Freq: Two times a day (BID) | ORAL | Status: DC | PRN
  Administered 2019-11-11: 0.25 mg via ORAL
  Filled 2019-11-11: qty 1

## 2019-11-11 MED ORDER — HYDRALAZINE HCL 20 MG/ML IJ SOLN
5.0000 mg | INTRAMUSCULAR | Status: DC | PRN
  Administered 2019-11-11: 5 mg via INTRAVENOUS
  Filled 2019-11-11: qty 1

## 2019-11-11 NOTE — ED Notes (Signed)
Patient ambulated to bedside commode without assistance, however patient did require some assistance sitting up in bed.

## 2019-11-11 NOTE — ED Notes (Signed)
Pt ambulated to first nurse desk to inquire about wait time. Pt informed that I cannot give her a wait time but we will get her to a room to see the doctor as soon as we can. Pt currently in NAD.

## 2019-11-11 NOTE — H&P (Signed)
History and Physical    Janice Perkins [id number] DOB: 12/01/1932 DOA: 11/11/2019  Referring MD/NP/PA:   PCP: [name], MD   Patient coming from:  The patient is coming from home.  At baseline, pt is independent for most of ADL.        Chief Complaint: Chest pain, generalized weakness, elevated blood pressure  HPI: Janice Perkins is a 86 y.o. female with medical history significant of hypertension, hyperlipidemia, COPD, asthma, GERD, depression with anxiety, breast cancer, atrial fibrillation not on anticoagulants, iron deficiency anemia, GI bleeding, duodenal ulcer, who presents with chest pain, generalized weakness, elevated blood pressure.  Patient states that she has been taking her blood pressure medications consistently, but her blood pressure is elevated, yesterday her blood pressure was up to 190.  He also has intermittent chest pain, which is located in the lower chest, pressure-like, moderate, radiating to the left arm.  She has mild dry cough and mild shortness of breath, no fever or chills. She also has generalized weakness, no unilateral numbness or tingling his extremities.  Patient has nausea, but no vomiting, diarrhea, abdominal pain, symptoms of UTI.  No facial droop or slurred speech.  ED Course: pt was found to have troponin 18, WBC 7.6, pending COVID-19 PCR, sodium 130, renal function okay, temperature normal, blood pressure 177/89, heart rate is 74, RR 16, oxygen saturation 97% on room air.  Chest x-ray negative.  Patient is placed telemetry bed of observation.  Cardiology, Dr. Saralyn Pilar is consulted.  Review of Systems:   General: no fevers, chills, no body weight gain, has fatigue HEENT: no blurry vision, hearing changes or sore throat Respiratory: has dyspnea, coughing, no wheezing CV: has chest pain, no palpitations GI: has nausea, no vomiting, abdominal pain, diarrhea, constipation GU: no dysuria, burning on urination, increased urinary frequency,  hematuria  Ext: no leg edema Neuro: no unilateral weakness, numbness, or tingling, no vision change or hearing loss Skin: no rash, no skin tear. MSK: No muscle spasm, no deformity, no limitation of range of movement in spin Heme: No easy bruising.  Travel history: No recent long distant travel.  Allergy:  Allergies  Allergen Reactions  . Diphenhydramine Hcl Rash  . Penicillins Hives    .Has patient had a PCN reaction causing immediate rash, facial/tongue/throat swelling, SOB or lightheadedness with hypotension: Unknown Has patient had a PCN reaction causing severe rash involving mucus membranes or skin necrosis: Unknown Has patient had a PCN reaction that required hospitalization: Unknown Has patient had a PCN reaction occurring within the last 10 years: Unknown If all of the above answers are "NO", then may proceed with Cephalosporin use.   . Captopril Other (See Comments)  . Eliquis [Apixaban] Other (See Comments)    Stomach bleed  . Enalapril Maleate Other (See Comments)    Other reaction(s): Unknown  . Tape Itching  . Terfenadine Other (See Comments)  . Augmentin [Amoxicillin-Pot Clavulanate] Rash    Has patient had a PCN reaction causing immediate rash, facial/tongue/throat swelling, SOB or lightheadedness with hypotension: Unknown Has patient had a PCN reaction causing severe rash involving mucus membranes or skin necrosis: Unknown Has patient had a PCN reaction that required hospitalization: Unknown Has patient had a PCN reaction occurring within the last 10 years: Unknown If all of the above answers are "NO", then may proceed with Cephalosporin use.  . Biaxin [Clarithromycin] Rash, Other (See Comments) and Hives    Other reaction(s): Unknown    Past Medical History:  Diagnosis Date  .  Asthma   . Atrial fibrillation ([place])   . Breast cancer ([place]) 2011  . Bronchitis 04/2015  . Cancer [facility]) 2011   breast  . COPD (chronic obstructive pulmonary disease) ([place])   .  Hypertension   . Shingles    10/2015    Past Surgical History:  Procedure Laterality Date  . BREAST EXCISIONAL BIOPSY Right 11/29/13   two areas FAT NECROSIS  . BREAST EXCISIONAL BIOPSY Right 10/30/2009   lumpectomy - radiation  . COLONOSCOPY WITH PROPOFOL N/A 06/10/2018   Procedure: COLONOSCOPY WITH PROPOFOL;  Surgeon: Manya Silvas, MD;  Location: [facility] ENDOSCOPY;  Service: Endoscopy;  Laterality: N/A;  . COLONOSCOPY WITH PROPOFOL N/A 02/20/2019   Procedure: COLONOSCOPY WITH PROPOFOL;  Surgeon: Lucilla Lame, MD;  Location: [facility] ENDOSCOPY;  Service: Endoscopy;  Laterality: N/A;  . ESOPHAGOGASTRODUODENOSCOPY (EGD) WITH PROPOFOL N/A 06/10/2018   Procedure: ESOPHAGOGASTRODUODENOSCOPY (EGD) WITH PROPOFOL;  Surgeon: Manya Silvas, MD;  Location: [facility] ENDOSCOPY;  Service: Endoscopy;  Laterality: N/A;  . ESOPHAGOGASTRODUODENOSCOPY (EGD) WITH PROPOFOL N/A 02/20/2019   Procedure: ESOPHAGOGASTRODUODENOSCOPY (EGD) WITH PROPOFOL;  Surgeon: Lucilla Lame, MD;  Location: [facility] ENDOSCOPY;  Service: Endoscopy;  Laterality: N/A;  . NASAL SINUS SURGERY      Social History:  reports that she has never smoked. She has never used smokeless tobacco. She reports that she does not drink alcohol or use drugs.  Family History:  Family History  Problem Relation Age of Onset  . Hypertension Mother   . Heart disease Mother   . Cancer Mother   . Stroke Father   . Hypertension Father   . Breast cancer Sister      Prior to Admission medications   Medication Sig Start Date End Date Taking? Authorizing Provider  albuterol (ACCUNEB) 1.25 MG/3ML nebulizer solution Inhale 3 mLs into the lungs every 6 (six) hours as needed for wheezing.     [provider]  albuterol (PROVENTIL HFA;VENTOLIN HFA) 108 (90 Base) MCG/ACT inhaler Inhale 2 puffs into the lungs every 6 (six) hours as needed for wheezing or shortness of breath.     [provider]  ALPRAZolam Duanne Moron) 0.5 MG tablet Take 0.5 mg by mouth 2  (two) times a day.     [provider]  alum & mag hydroxide-simeth (MAALOX/MYLANTA) 200-200-20 MG/5ML suspension Take 30 mLs by mouth every 4 (four) hours as needed for indigestion or heartburn. 06/08/19   Demetrios Loll, MD  atorvastatin (LIPITOR) 40 MG tablet Take 40 mg by mouth daily. 03/17/19   [provider]  azithromycin (ZITHROMAX Z-PAK) 250 MG tablet Take 2 tablets (500 mg) on  Day 1,  followed by 1 tablet (250 mg) once daily on Days 2 through 5. Patient not taking: Reported on 10/30/2019 09/18/19   Duffy Bruce, MD  benzonatate (TESSALON PERLES) 100 MG capsule Take 1 capsule (100 mg total) by mouth 3 (three) times daily as needed for cough. Patient not taking: Reported on 10/30/2019 08/25/19 08/24/20  Blake Divine, MD  benzonatate (TESSALON PERLES) 100 MG capsule Take 1 capsule (100 mg total) by mouth 3 (three) times daily as needed for cough. Patient not taking: Reported on 10/30/2019 09/18/19 09/17/20  Duffy Bruce, MD  busPIRone (BUSPAR) 10 MG tablet Take 10 mg by mouth 2 (two) times daily.    [provider]  cholecalciferol (VITAMIN D) 1000 units tablet Take 1,000 Units by mouth daily.    [provider]  cloNIDine (CATAPRES) 0.1 MG tablet Take 1 tablet (0.1 mg total) by mouth  3 (three) times daily. 08/08/19   Ezekiel Slocumb, DO  diltiazem (CARDIZEM CD) 180 MG 24 hr capsule Take 1 capsule (180 mg total) by mouth daily. Patient not taking: Reported on 10/30/2019 08/08/19   Nicole Kindred A, DO  famotidine (PEPCID) 20 MG tablet Take 20 mg by mouth 2 (two) times daily. 07/29/19   [provider]  ferrous sulfate 325 (65 FE) MG tablet Take 325 mg by mouth daily with breakfast.    [provider]  fluticasone (FLONASE) 50 MCG/ACT nasal spray Place 2 sprays into both nostrils daily. 10/17/19   [provider]  Fluticasone-Salmeterol (ADVAIR) 250-50 MCG/DOSE AEPB Inhale 1 puff into the lungs 2 (two) times daily.    [provider]  furosemide (LASIX) 20 MG tablet Take 1 tablet (20 mg total) by mouth daily for 7 days. Patient taking differently: Take 20 mg by mouth every other day.  10/21/19 10/30/19  Arta Silence, MD  gabapentin (NEURONTIN) 300 MG capsule Take 300 mg by mouth daily.    [provider]  hydrALAZINE (APRESOLINE) 50 MG tablet Take 50 mg by mouth 3 (three) times daily. 07/10/19   [provider]  HYDROcodone-acetaminophen (NORCO/VICODIN) 5-325 MG tablet Take 1 tablet by mouth 2 (two) times daily.  04/26/19   [provider]  levothyroxine (SYNTHROID, LEVOTHROID) 50 MCG tablet Take 50 mcg by mouth daily before breakfast.    [provider]  loratadine (CLARITIN) 10 MG tablet Take 10 mg by mouth daily.    [provider]  losartan (COZAAR) 50 MG tablet Take 2 tablets (100 mg total) by mouth at bedtime. Patient taking differently: Take 100 mg by mouth daily.  12/13/18   Loletha Grayer, MD  magnesium oxide (MAG-OX) 400 MG tablet Take 400 mg by mouth 2 (two) times daily.    [provider]  metoprolol succinate (TOPROL-XL) 25 MG 24 hr tablet Take 1 tablet (25 mg total) by mouth daily. Patient not taking: Reported on 10/30/2019 06/10/19   Loletha Grayer, MD  polyethylene glycol (MIRALAX / GLYCOLAX) 17 g packet Take 17 g by mouth daily. 01/03/19   Earleen Newport, MD  RABEprazole (ACIPHEX) 20 MG tablet Take 20 mg by mouth 2 (two) times daily.    [provider]  rOPINIRole (REQUIP) 1 MG tablet Take 1 mg by mouth at bedtime. 10/17/19   [provider]  simethicone (MYLICON) 80 MG chewable tablet Chew 80 mg by mouth 2 (two) times daily.    [provider]  venlafaxine XR (EFFEXOR-XR) 75 MG 24 hr capsule Take 75 mg by mouth daily.    [provider]    Physical Exam: Vitals:   11/11/19 0600 11/11/19 0856 11/11/19 0900  BP: (!) 177/89 (!) 168/87 (!) 167/92  Pulse: 74 74 76  Resp: 16 18 19   Temp: 98.5 F (36.9 C)     SpO2: 97% 98% 99%  Weight: 72.6 kg    Height: 5' (1.524 m)     General: Not in acute distress HEENT:       Eyes: PERRL, EOMI, no scleral icterus.       ENT: No discharge from the ears and nose, no pharynx injection, no tonsillar enlargement.        Neck: No JVD, no bruit, no mass felt. Heme: No neck lymph node enlargement. Cardiac: S1/S2, RRR, No murmurs, No gallops or rubs. Respiratory: No rales, wheezing, rhonchi or rubs. GI: Soft, nondistended, nontender, no rebound pain, no organomegaly, BS present.  GU: No hematuria Ext: No pitting leg edema bilaterally. 2+DP/PT pulse bilaterally. Musculoskeletal: No joint deformities, No joint redness or warmth, no limitation of ROM in spin. Skin: No rashes.  Neuro: Alert, oriented X3, cranial nerves II-XII grossly intact, moves all extremities normally.  Psych: Patient is not psychotic, no suicidal or hemocidal ideation.  Labs on Admission: I have personally reviewed following labs and imaging studies  CBC: Recent Labs  Lab 11/08/19 1514 11/11/19 0610  WBC 8.8 7.6  HGB 12.1 12.4  HCT 34.6* 35.4*  MCV 90.1 91.0  PLT 280 [id number]   Basic Metabolic Panel: Recent Labs  Lab 11/08/19 1514 11/11/19 0610  NA 126* 130*  K 3.9 3.9  CL 92* 95*  CO2 23 25  GLUCOSE 118* 119*  BUN 13 21  CREATININE 0.82 0.84  CALCIUM 9.5 9.4   GFR: Estimated Creatinine Clearance: 43.5 mL/min (by C-G formula based on SCr of 0.84 mg/dL). Liver Function Tests: Recent Labs  Lab 11/08/19 1514  AST 36  ALT 26  ALKPHOS 40  BILITOT 1.1  PROT 7.4  ALBUMIN 4.3   No results for input(s): LIPASE, AMYLASE in the last 168 hours. No results for input(s): AMMONIA in the last 168 hours. Coagulation Profile: No results for input(s): INR, PROTIME in the last 168 hours. Cardiac Enzymes: No results for input(s): CKTOTAL, CKMB, CKMBINDEX, TROPONINI in the last 168 hours. BNP (last 3 results) No results for input(s): PROBNP in the last 8760 hours. HbA1C: No results  for input(s): HGBA1C in the last 72 hours. CBG: No results for input(s): GLUCAP in the last 168 hours. Lipid Profile: No results for input(s): CHOL, HDL, LDLCALC, TRIG, CHOLHDL, LDLDIRECT in the last 72 hours. Thyroid Function Tests: No results for input(s): TSH, T4TOTAL, FREET4, T3FREE, THYROIDAB in the last 72 hours. Anemia Panel: No results for input(s): VITAMINB12, FOLATE, FERRITIN, TIBC, IRON, RETICCTPCT in the last 72 hours. Urine analysis:    Component Value Date/Time   COLORURINE YELLOW (A) 11/08/2019 1514   APPEARANCEUR HAZY (A) 11/08/2019 1514   LABSPEC 1.011 11/08/2019 1514   PHURINE 6.0 11/08/2019 1514   GLUCOSEU NEGATIVE 11/08/2019 1514   HGBUR NEGATIVE 11/08/2019 1514   BILIRUBINUR NEGATIVE 11/08/2019 1514   KETONESUR NEGATIVE 11/08/2019 1514   PROTEINUR NEGATIVE 11/08/2019 1514   NITRITE NEGATIVE 11/08/2019 1514   LEUKOCYTESUR LARGE (A) 11/08/2019 1514   Sepsis Labs: @LABRCNTIP (procalcitonin:4,lacticidven:4) )No results found for this or any previous visit (from the past 240 hour(s)).   Radiological Exams on Admission: DG Chest 2 View  Result Date: 11/11/2019 CLINICAL DATA:  Hypertension, status post fall EXAM: CHEST - 2 VIEW COMPARISON:  09/18/2019 FINDINGS: The lungs are hyperinflated likely secondary to COPD. There is no focal consolidation. There is no pleural effusion or pneumothorax. The heart and mediastinal contours are unremarkable. There is no acute osseous abnormality. There is a left shoulder arthroplasty. IMPRESSION: No active cardiopulmonary disease. Electronically Signed   By: Kathreen Devoid   On: 11/11/2019 06:48     EKG: Independently reviewed.  Atrial fibrillation, QTc 420, LAD, poor R wave progression   Assessment/Plan Principal Problem:   Chest pain Active Problems:   Hyponatremia   HTN (hypertension)   COPD (chronic obstructive pulmonary disease) (HCC)   Atrial fibrillation (HCC)   HLD (hyperlipidemia)   GERD (gastroesophageal reflux  disease)   Depression with anxiety   Iron deficiency anemia   Hypertensive urgency   Elevated troponin   Chest pain: initial trop 18. Patient has elevated blood pressure, indicating possible  demand ischemia.  Dr. Saralyn Pilar of cardiology is consulted. - will place on Tele bed for obs - Trend Trop - Repeat EKG in the am  - prn Nitroglycerin, Morphine, lipitor - Patient received 324 mg of aspirin, will not continue aspirin since patient has GI bleeding secondary to duodenal ulcer - Risk factor stratification: will check FLP and A1C   Hyponatremia: Na 130.  Mental status normal.  Likely due to poor oral intake and lasix use - Will check urine sodium, urine osmolality, serum osmolality. - check TSH - IVF: NS 75 mL/h - f/u by BMP - hold lasix  HTN and Hypertensive urgency: -Continue home medications: Clonidine, hydralazine, Cozaar -hydralazine prn  COPD (chronic obstructive pulmonary disease) ([place]): stabel -Bronchodilators  Atrial fibrillation [facility]): Not taking anticoagulants due to GI bleeding.  Patient used to be on Cardizem and metoprolol, currently is not taking these medications.  Heart rate is well controlled. -Cardiac monitor  HLD (hyperlipidemia) -Lipitor  GERD (gastroesophageal reflux disease) and history of duodenal ulcer: -Protonix, Pepcid  Depression and anxiety: Stable, no suicidal or homicidal ideations. -Continue home medications   Iron deficiency anemia: Hemoglobin stable, 12.4 -Continue iron supplement.     DVT ppx:  SQ Lovenox Code Status: Full code Family Communication: I have tried to call her husband, daughter and his son without success Disposition Plan:  Anticipate discharge back to previous home environment Consults called:  Dr. Saralyn Pilar of cardiology Admission status: Tele bed for obs    Date of Service 11/11/2019    Ivor Costa [facility]   If 7PM-7AM, please contact night-coverage [email or web address] 11/11/2019, 9:35 AM

## 2019-11-11 NOTE — ED Notes (Signed)
Cardiologist in to see pt  

## 2019-11-11 NOTE — ED Provider Notes (Signed)
[facility] Emergency Department Provider Note   ____________________________________________    I have reviewed the triage vital signs and the nursing notes.   HISTORY  Chief Complaint Weakness, chest pain    HPI Janice Perkins is a 86 y.o. female with history of atrial fibrillation, not on anticoagulation, COPD, hypertension who presents with complaints of fatigue/weakness, nausea and woke up this morning with mild aching chest discomfort which radiated to her left arm.  She reports she has not been feeling well for nearly a week, was seen in the emergency department recently for low sodium which is been at her baseline.  No abdominal pain.  No diarrhea.  No fevers or chills.  Also reports her blood pressure has been higher than typical.  Past Medical History:  Diagnosis Date  . Asthma   . Atrial fibrillation ([place])   . Breast cancer ([place]) 2011  . Bronchitis 04/2015  . Cancer [facility]) 2011   breast  . COPD (chronic obstructive pulmonary disease) ([place])   . Hypertension   . Shingles    10/2015    Patient Active Problem List   Diagnosis Date Noted  . Chest pain 11/11/2019  . COPD (chronic obstructive pulmonary disease) ([place])   . Atrial fibrillation ([place])   . Acute encephalopathy 08/07/2019  . Fluid overload 06/09/2019  . Bronchitis 05/31/2019  . Hypothyroidism 05/31/2019  . Duodenum ulcer   . Melena   . GI bleed 02/17/2019  . Hypertensive encephalopathy 12/11/2018  . Hypoxia 05/30/2018  . Leukocytosis 04/05/2018  . Pneumonia 11/15/2017  . Pleural effusion 05/16/2017  . AF (paroxysmal atrial fibrillation) ([place]) 05/16/2017  . Breast cancer ([place]) 05/16/2017  . Dependence on nocturnal oxygen therapy 05/16/2017  . HTN (hypertension) 05/16/2017  . Generalized weakness 03/07/2017  . Hyponatremia 03/07/2017  . Dehydration 03/07/2017  . UTI (urinary tract infection) 03/07/2017  . HCAP (healthcare-associated pneumonia) 01/19/2017  . AKI (acute  kidney injury) ([place]) 12/18/2016  . Primary cancer of upper outer quadrant of right female breast ([place]) 04/07/2016  . Lumbar radiculopathy 03/23/2016  . Spinal stenosis, lumbar region, with neurogenic claudication 03/23/2016  . DDD (degenerative disc disease), lumbar 01/14/2015  . Lumbosacral facet joint syndrome 01/14/2015  . Sacroiliac joint dysfunction 01/14/2015  . Greater trochanteric bursitis 01/14/2015  . Bilateral occipital neuralgia 01/14/2015    Past Surgical History:  Procedure Laterality Date  . BREAST EXCISIONAL BIOPSY Right 11/29/13   two areas FAT NECROSIS  . BREAST EXCISIONAL BIOPSY Right 10/30/2009   lumpectomy - radiation  . COLONOSCOPY WITH PROPOFOL N/A 06/10/2018   Procedure: COLONOSCOPY WITH PROPOFOL;  Surgeon: Manya Silvas, MD;  Location[facility] ENDOSCOPY;  Service: Endoscopy;  Laterality: N/A;  . COLONOSCOPY WITH PROPOFOL N/A 02/20/2019   Procedure: COLONOSCOPY WITH PROPOFOL;  Surgeon: Lucilla Lame, MD;  Location: [facility] ENDOSCOPY;  Service: Endoscopy;  Laterality: N/A;  . ESOPHAGOGASTRODUODENOSCOPY (EGD) WITH PROPOFOL N/A 06/10/2018   Procedure: ESOPHAGOGASTRODUODENOSCOPY (EGD) WITH PROPOFOL;  Surgeon: Manya Silvas, MD;  Location: [facility] ENDOSCOPY;  Service: Endoscopy;  Laterality: N/A;  . ESOPHAGOGASTRODUODENOSCOPY (EGD) WITH PROPOFOL N/A 02/20/2019   Procedure: ESOPHAGOGASTRODUODENOSCOPY (EGD) WITH PROPOFOL;  Surgeon: Lucilla Lame, MD;  Location: [facility] ENDOSCOPY;  Service: Endoscopy;  Laterality: N/A;  . NASAL SINUS SURGERY      Prior to Admission medications   Medication Sig Start Date End Date Taking? Authorizing Provider  albuterol (ACCUNEB) 1.25 MG/3ML nebulizer solution Inhale 3 mLs into the lungs every 6 (six) hours as needed for wheezing.  [provider]  albuterol (PROVENTIL HFA;VENTOLIN HFA) 108 (90 Base) MCG/ACT inhaler Inhale 2 puffs into the lungs every 6 (six) hours as needed for wheezing or shortness of breath.     [provider]  ALPRAZolam Duanne Moron) 0.5 MG tablet Take 0.5 mg by mouth 2 (two) times a day.     [provider]  alum & mag hydroxide-simeth (MAALOX/MYLANTA) 200-200-20 MG/5ML suspension Take 30 mLs by mouth every 4 (four) hours as needed for indigestion or heartburn. 06/08/19   Demetrios Loll, MD  atorvastatin (LIPITOR) 40 MG tablet Take 40 mg by mouth daily. 03/17/19   [provider]  azithromycin (ZITHROMAX Z-PAK) 250 MG tablet Take 2 tablets (500 mg) on  Day 1,  followed by 1 tablet (250 mg) once daily on Days 2 through 5. Patient not taking: Reported on 10/30/2019 09/18/19   Duffy Bruce, MD  benzonatate (TESSALON PERLES) 100 MG capsule Take 1 capsule (100 mg total) by mouth 3 (three) times daily as needed for cough. Patient not taking: Reported on 10/30/2019 08/25/19 08/24/20  Blake Divine, MD  benzonatate (TESSALON PERLES) 100 MG capsule Take 1 capsule (100 mg total) by mouth 3 (three) times daily as needed for cough. Patient not taking: Reported on 10/30/2019 09/18/19 09/17/20  Duffy Bruce, MD  busPIRone (BUSPAR) 10 MG tablet Take 10 mg by mouth 2 (two) times daily.    [provider]  cholecalciferol (VITAMIN D) 1000 units tablet Take 1,000 Units by mouth daily.    [provider]  cloNIDine (CATAPRES) 0.1 MG tablet Take 1 tablet (0.1 mg total) by mouth 3 (three) times daily. 08/08/19   Ezekiel Slocumb, DO  diltiazem (CARDIZEM CD) 180 MG 24 hr capsule Take 1 capsule (180 mg total) by mouth daily. Patient not taking: Reported on 10/30/2019 08/08/19   Nicole Kindred A, DO  famotidine (PEPCID) 20 MG tablet Take 20 mg by mouth 2 (two) times daily. 07/29/19   [provider]  ferrous sulfate 325 (65 FE) MG tablet Take 325 mg by mouth daily with breakfast.    [provider]  fluticasone (FLONASE) 50 MCG/ACT nasal spray Place 2 sprays into both nostrils daily. 10/17/19   [provider]  Fluticasone-Salmeterol (ADVAIR) 250-50 MCG/DOSE AEPB Inhale 1 puff  into the lungs 2 (two) times daily.    [provider]  furosemide (LASIX) 20 MG tablet Take 1 tablet (20 mg total) by mouth daily for 7 days. Patient taking differently: Take 20 mg by mouth every other day.  10/21/19 10/30/19  Arta Silence, MD  gabapentin (NEURONTIN) 300 MG capsule Take 300 mg by mouth daily.    [provider]  hydrALAZINE (APRESOLINE) 50 MG tablet Take 50 mg by mouth 3 (three) times daily. 07/10/19   [provider]  HYDROcodone-acetaminophen (NORCO/VICODIN) 5-325 MG tablet Take 1 tablet by mouth 2 (two) times daily.  04/26/19   [provider]  levothyroxine (SYNTHROID, LEVOTHROID) 50 MCG tablet Take 50 mcg by mouth daily before breakfast.    [provider]  loratadine (CLARITIN) 10 MG tablet Take 10 mg by mouth daily.    [provider]  losartan (COZAAR) 50 MG tablet Take 2 tablets (100 mg total) by mouth at bedtime. Patient taking differently: Take 100 mg by mouth daily.  12/13/18   Loletha Grayer, MD  magnesium oxide (MAG-OX) 400 MG tablet Take 400 mg by mouth 2 (two) times daily.    [provider]  metoprolol succinate (TOPROL-XL) 25 MG  24 hr tablet Take 1 tablet (25 mg total) by mouth daily. Patient not taking: Reported on 10/30/2019 06/10/19   Loletha Grayer, MD  polyethylene glycol (MIRALAX / GLYCOLAX) 17 g packet Take 17 g by mouth daily. 01/03/19   Earleen Newport, MD  RABEprazole (ACIPHEX) 20 MG tablet Take 20 mg by mouth 2 (two) times daily.    [provider]  rOPINIRole (REQUIP) 1 MG tablet Take 1 mg by mouth at bedtime. 10/17/19   [provider]  simethicone (MYLICON) 80 MG chewable tablet Chew 80 mg by mouth 2 (two) times daily.    [provider]  venlafaxine XR (EFFEXOR-XR) 75 MG 24 hr capsule Take 75 mg by mouth daily.    [provider]     Allergies Diphenhydramine hcl, Penicillins, Captopril, Eliquis [apixaban], Enalapril maleate, Tape,  Terfenadine, Augmentin [amoxicillin-pot clavulanate], and Biaxin [clarithromycin]  Family History  Problem Relation Age of Onset  . Hypertension Mother   . Heart disease Mother   . Cancer Mother   . Stroke Father   . Hypertension Father   . Breast cancer Sister     Social History Social History   Tobacco Use  . Smoking status: Never Smoker  . Smokeless tobacco: Never Used  Substance Use Topics  . Alcohol use: No    Alcohol/week: 0.0 standard drinks  . Drug use: No    Review of Systems  Constitutional: No fever/chills Eyes: No visual changes.  ENT: No sore throat. Cardiovascular: As above Respiratory: Denies shortness of breath. Gastrointestinal: No abdominal pain.  Positive nausea Genitourinary: Negative for dysuria. Musculoskeletal: Negative for back pain. Skin: Negative for rash. Neurological: Negative for headaches   ____________________________________________   PHYSICAL EXAM:  VITAL SIGNS: ED Triage Vitals [11/11/19 0600]  Enc Vitals Group     BP (!) 177/89     Pulse Rate 74     Resp 16     Temp 98.5 F (36.9 C)     Temp src      SpO2 97 %     Weight 72.6 kg (159 lb 15.8 oz)     Height 1.524 m (5')     Head Circumference      Peak Flow      Pain Score 0     Pain Loc      Pain Edu?      Excl. in [place]?     Constitutional: Alert and oriented.  Nose: No congestion/rhinnorhea. Mouth/Throat: Mucous membranes are moist.    Cardiovascular: Normal rate, regular rhythm. Grossly normal heart sounds.  Good peripheral circulation. Respiratory: Normal respiratory effort.  No retractions. Lungs CTAB. Gastrointestinal: Soft and nontender. No distention.    Musculoskeletal: No lower extremity tenderness nor edema.  Warm and well perfused Neurologic:  Normal speech and language. No gross focal neurologic deficits are appreciated.  Skin:  Skin is warm, dry and intact. No rash noted. Psychiatric: Mood and affect are normal. Speech and behavior are  normal.  ____________________________________________   LABS (all labs ordered are listed, but only abnormal results are displayed)  Labs Reviewed  BASIC METABOLIC PANEL - Abnormal; Notable for the following components:      Result Value   Sodium 130 (*)    Chloride 95 (*)    Glucose, Bld 119 (*)    All other components within normal limits  CBC - Abnormal; Notable for the following components:   HCT 35.4 (*)    All other components within normal limits  TROPONIN I (  HIGH SENSITIVITY) - Abnormal; Notable for the following components:   Troponin I (High Sensitivity) 18 (*)    All other components within normal limits  SARS CORONAVIRUS 2 (TAT 6-24 HRS)  TROPONIN I (HIGH SENSITIVITY)   ____________________________________________  EKG  ED ECG REPORT I, Lavonia Drafts, the attending physician, personally viewed and interpreted this ECG.  Date: 11/11/2019  Rhythm: Atrial fibrillation QRS Axis: normal Intervals: Abnormal ST/T Wave abnormalities: normal Narrative Interpretation: no evidence of acute ischemia  ____________________________________________  RADIOLOGY  Chest x-ray viewed by me, normal ____________________________________________   PROCEDURES  Procedure(s) performed: No  Procedures   Critical Care performed: No ____________________________________________   INITIAL IMPRESSION / ASSESSMENT AND PLAN / ED COURSE  Pertinent labs & imaging results that were available during my care of the patient were reviewed by me and considered in my medical decision making (see chart for details).  Patient presents with primary complaint of weakness, fatigue, nausea and then had discomfort in her chest and aching in her left arm today.  Currently she does not have any chest discomfort.  EKG is unchanged from prior however initial troponin is elevated above her baseline at 18.  CBC is normal.  Sodium is at her baseline.  Chest x-ray is reassuring, no Covid  symptoms.  Given hypertension, elevated troponin, HPI will discuss with hospitalist for admission    ____________________________________________   FINAL CLINICAL IMPRESSION(S) / ED DIAGNOSES  Final diagnoses:  Atypical chest pain  Elevated troponin        Note:  This document was prepared using Dragon voice recognition software and may include unintentional dictation errors.   Lavonia Drafts, MD 11/11/19 [phone]

## 2019-11-11 NOTE — ED Triage Notes (Signed)
Patient c/o hypertension all day yesterday - systolic's in the [id number]. Patient reports arm pain yesterday that has since resolved.

## 2019-11-11 NOTE — Consult Note (Signed)
[facility] Cardiology  CARDIOLOGY CONSULT NOTE  Patient ID: Janice Perkins MRN: [id number] DOB/AGE: 11-14-1932 86 y.o.  Admit date: 11/11/2019 Referring Physician Blaine Hamper Primary Physician [facility] Primary Cardiologist Fath Reason for Consultation chest pain  HPI: 86 year old female referred for evaluation of chest pain.  Patient awoke this morning, experienced chest discomfort with radiation to her left arm.  She presents to [facility] emergency room where ECG revealed atrial fibrillation at a rate of 83 bpm without ischemic ST-T wave changes.  The patient has a history of chronic atrial fibrillation, not on chronic anticoagulation.  Patient has a history of GI bleed on aspirin and Plavix.  Admission labs notable for high sensory troponin of 18.  Patient currently denies chest pain.  Review of systems complete and found to be negative unless listed above     Past Medical History:  Diagnosis Date  . Asthma   . Atrial fibrillation ([place])   . Breast cancer ([place]) 2011  . Bronchitis 04/2015  . Cancer [facility]) 2011   breast  . COPD (chronic obstructive pulmonary disease) ([place])   . Hypertension   . Shingles    10/2015    Past Surgical History:  Procedure Laterality Date  . BREAST EXCISIONAL BIOPSY Right 11/29/13   two areas FAT NECROSIS  . BREAST EXCISIONAL BIOPSY Right 10/30/2009   lumpectomy - radiation  . COLONOSCOPY WITH PROPOFOL N/A 06/10/2018   Procedure: COLONOSCOPY WITH PROPOFOL;  Surgeon: Manya Silvas, MD;  Location: [facility] ENDOSCOPY;  Service: Endoscopy;  Laterality: N/A;  . COLONOSCOPY WITH PROPOFOL N/A 02/20/2019   Procedure: COLONOSCOPY WITH PROPOFOL;  Surgeon: Lucilla Lame, MD;  Location: [facility] ENDOSCOPY;  Service: Endoscopy;  Laterality: N/A;  . ESOPHAGOGASTRODUODENOSCOPY (EGD) WITH PROPOFOL N/A 06/10/2018   Procedure: ESOPHAGOGASTRODUODENOSCOPY (EGD) WITH PROPOFOL;  Surgeon: Manya Silvas, MD;  Location: [facility] ENDOSCOPY;  Service: Endoscopy;  Laterality: N/A;  . ESOPHAGOGASTRODUODENOSCOPY  (EGD) WITH PROPOFOL N/A 02/20/2019   Procedure: ESOPHAGOGASTRODUODENOSCOPY (EGD) WITH PROPOFOL;  Surgeon: Lucilla Lame, MD;  Location: [facility] ENDOSCOPY;  Service: Endoscopy;  Laterality: N/A;  . NASAL SINUS SURGERY      (Not in a hospital admission)  Social History   Socioeconomic History  . Marital status: Married    Spouse name: Not on file  . Number of children: Not on file  . Years of education: Not on file  . Highest education level: Not on file  Occupational History  . Not on file  Tobacco Use  . Smoking status: Never Smoker  . Smokeless tobacco: Never Used  Substance and Sexual Activity  . Alcohol use: No    Alcohol/week: 0.0 standard drinks  . Drug use: No  . Sexual activity: Not on file  Other Topics Concern  . Not on file  Social History Narrative  . Not on file   Social Determinants of Health   Financial Resource Strain:   . Difficulty of Paying Living Expenses:   Food Insecurity:   . Worried About Charity fundraiser in the Last Year:   . Arboriculturist in the Last Year:   Transportation Needs:   . Film/video editor (Medical):   Marland Kitchen Lack of Transportation (Non-Medical):   Physical Activity:   . Days of Exercise per Week:   . Minutes of Exercise per Session:   Stress:   . Feeling of Stress :   Social Connections:   . Frequency of Communication with Friends and Family:   . Frequency of Social Gatherings with Friends and Family:   .  Attends Religious Services:   . Active Member of Clubs or Organizations:   . Attends Archivist Meetings:   Marland Kitchen Marital Status:   Intimate Partner Violence:   . Fear of Current or Ex-Partner:   . Emotionally Abused:   [place] Kitchen Physically Abused:   . Sexually Abused:     Family History  Problem Relation Age of Onset  . Hypertension Mother   . Heart disease Mother   . Cancer Mother   . Stroke Father   . Hypertension Father   . Breast cancer Sister       Review of systems complete and found to be negative unless  listed above      PHYSICAL EXAM  General: Well developed, well nourished, in no acute distress HEENT:  Normocephalic and atramatic Neck:  No JVD.  Lungs: Clear bilaterally to auscultation and percussion. Heart: HRRR . Normal S1 and S2 without gallops or murmurs.  Abdomen: Bowel sounds are positive, abdomen soft and non-tender  Msk:  Back normal, normal gait. Normal strength and tone for age. Extremities: No clubbing, cyanosis or edema.   Neuro: Alert and oriented X 3. Psych:  Good affect, responds appropriately  Labs:   Lab Results  Component Value Date   WBC 7.6 11/11/2019   HGB 12.4 11/11/2019   HCT 35.4 (L) 11/11/2019   MCV 91.0 11/11/2019   PLT 277 11/11/2019    Recent Labs  Lab 11/08/19 1514 11/08/19 1514 11/11/19 0610  NA 126*   < > 130*  K 3.9   < > 3.9  CL 92*   < > 95*  CO2 23   < > 25  BUN 13   < > 21  CREATININE 0.82   < > 0.84  CALCIUM 9.5   < > 9.4  PROT 7.4  --   --   BILITOT 1.1  --   --   ALKPHOS 40  --   --   ALT 26  --   --   AST 36  --   --   GLUCOSE 118*   < > 119*   < > = values in this interval not displayed.   Lab Results  Component Value Date   TROPONINI <0.03 12/28/2018    Lab Results  Component Value Date   CHOL 177 12/12/2018   CHOL 120 12/18/2016   Lab Results  Component Value Date   HDL 63 12/12/2018   HDL 30 (L) 12/18/2016   Lab Results  Component Value Date   LDLCALC 102 (H) 12/12/2018   LDLCALC 74 12/18/2016   Lab Results  Component Value Date   TRIG 59 12/12/2018   TRIG 81 12/18/2016   Lab Results  Component Value Date   CHOLHDL 2.8 12/12/2018   CHOLHDL 4.0 12/18/2016   No results found for: LDLDIRECT    Radiology: DG Chest 2 View  Result Date: 11/11/2019 CLINICAL DATA:  Hypertension, status post fall EXAM: CHEST - 2 VIEW COMPARISON:  09/18/2019 FINDINGS: The lungs are hyperinflated likely secondary to COPD. There is no focal consolidation. There is no pleural effusion or pneumothorax. The heart and  mediastinal contours are unremarkable. There is no acute osseous abnormality. There is a left shoulder arthroplasty. IMPRESSION: No active cardiopulmonary disease. Electronically Signed   By: Kathreen Devoid   On: 11/11/2019 06:48   DG Abd 1 View  Result Date: 11/03/2019 CLINICAL DATA:  Constipation EXAM: ABDOMEN - 1 VIEW COMPARISON:  None. FINDINGS: Nonobstructive pattern of bowel gas. Large  burden of stool in the left and right colon. No free air in the abdomen. IMPRESSION: Nonobstructive pattern of bowel gas. Large burden of stool in the left and right colon. Electronically Signed   By: Eddie Candle M.D.   On: 11/03/2019 10:02   CT Head Wo Contrast  Result Date: 10/30/2019 CLINICAL DATA:  Hypertension with headache EXAM: CT HEAD WITHOUT CONTRAST TECHNIQUE: Contiguous axial images were obtained from the base of the skull through the vertex without intravenous contrast. COMPARISON:  Head CT August 07, 2019 FINDINGS: Brain: Mild diffuse atrophy is stable. There is no evident intracranial mass, hemorrhage, extra-axial fluid collection, or midline shift. There is relatively mild patchy small vessel disease in the centra semiovale bilaterally, stable. There is no evident acute infarct. Vascular: There is no hyperdense vessel. There is calcification in each carotid siphon region. Skull: The bony calvarium appears intact. Sinuses/Orbits: Visualized paranasal sinuses are clear. Visualized orbits appear symmetric bilaterally. Other: Mastoid air cells are clear. IMPRESSION: Stable mild atrophy with periventricular small vessel disease. No evident acute infarct. No mass or hemorrhage. There are foci of arterial vascular calcification. Electronically Signed   By: Lowella Grip III M.D.   On: 10/30/2019 08:44    EKG: Atrial fibrillation with controlled ventricular rate at 83 bpm  ASSESSMENT AND PLAN:   1.  Chest pain, with atypical features, currently clinically stable, normal initial high-sensitivity  troponin, ECG nondiagnostic 2.  Chronic atrial fibrillation, rate controlled, not on chronic anticoagulation, with history of GI bleed on aspirin and Plavix  Recommendations  1.  Agree with overall current therapy 2.  Defer full dose anticoagulation 3.  Defer chronic anticoagulation for atrial fibrillation, which can be addressed with Dr. Ubaldo Glassing as outpatient 4.  Continue to cycle high sensitive troponin 5.  Further recommendations pending patient's initial clinical course and repeat high since her troponin.  If no significant delta observed, patient could be discharged, with follow-up with Dr. Ubaldo Glassing with further outpatient evaluation.  Signed: Isaias Cowman MD,PhD, [facility] 11/11/2019, 1:39 PM

## 2019-11-12 DIAGNOSIS — I4891 Unspecified atrial fibrillation: Secondary | ICD-10-CM | POA: Diagnosis not present

## 2019-11-12 DIAGNOSIS — F418 Other specified anxiety disorders: Secondary | ICD-10-CM | POA: Diagnosis not present

## 2019-11-12 DIAGNOSIS — R0789 Other chest pain: Secondary | ICD-10-CM | POA: Diagnosis not present

## 2019-11-12 DIAGNOSIS — R079 Chest pain, unspecified: Secondary | ICD-10-CM | POA: Diagnosis not present

## 2019-11-12 LAB — CBC
HCT: 34.5 % — ABNORMAL LOW (ref 36.0–46.0)
Hemoglobin: 12 g/dL (ref 12.0–15.0)
MCH: 31.8 pg (ref 26.0–34.0)
MCHC: 34.8 g/dL (ref 30.0–36.0)
MCV: 91.5 fL (ref 80.0–100.0)
Platelets: 232 10*3/uL (ref 150–400)
RBC: 3.77 MIL/uL — ABNORMAL LOW (ref 3.87–5.11)
RDW: 13.4 % (ref 11.5–15.5)
WBC: 7.2 10*3/uL (ref 4.0–10.5)
nRBC: 0 % (ref 0.0–0.2)

## 2019-11-12 LAB — LIPID PANEL
Cholesterol: 140 mg/dL (ref 0–200)
HDL: 67 mg/dL (ref >40)
LDL Cholesterol: 63 mg/dL (ref 0–99)
Total CHOL/HDL Ratio: 2.1 RATIO
Triglycerides: 48 mg/dL (ref <150)
VLDL: 10 mg/dL (ref 0–40)

## 2019-11-12 LAB — BASIC METABOLIC PANEL
Anion gap: 10 (ref 5–15)
BUN: 12 mg/dL (ref 8–23)
CO2: 22 mmol/L (ref 22–32)
Calcium: 8.4 mg/dL — ABNORMAL LOW (ref 8.9–10.3)
Chloride: 100 mmol/L (ref 98–111)
Creatinine, Ser: 0.71 mg/dL (ref 0.44–1.00)
GFR calc Af Amer: >60 mL/min (ref >60)
GFR calc non Af Amer: >60 mL/min (ref >60)
Glucose, Bld: 100 mg/dL — ABNORMAL HIGH (ref 70–99)
Potassium: 3.5 mmol/L (ref 3.5–5.1)
Sodium: 132 mmol/L — ABNORMAL LOW (ref 135–145)

## 2019-11-12 LAB — TROPONIN I (HIGH SENSITIVITY)
Troponin I (High Sensitivity): 18 ng/L — ABNORMAL HIGH (ref <18)
Troponin I (High Sensitivity): 21 ng/L — ABNORMAL HIGH (ref <18)

## 2019-11-12 LAB — SODIUM, URINE, RANDOM: Sodium, Ur: 123 mmol/L

## 2019-11-12 LAB — HEMOGLOBIN A1C
Hgb A1c MFr Bld: 5.8 % — ABNORMAL HIGH (ref 4.8–5.6)
Mean Plasma Glucose: 119.76 mg/dL

## 2019-11-12 LAB — OSMOLALITY: Osmolality: 269 mOsm/kg — ABNORMAL LOW (ref 275–295)

## 2019-11-12 LAB — TSH: TSH: 3.32 u[IU]/mL (ref 0.350–4.500)

## 2019-11-12 LAB — SARS CORONAVIRUS 2 (TAT 6-24 HRS): SARS Coronavirus 2: NEGATIVE

## 2019-11-12 LAB — OSMOLALITY, URINE: Osmolality, Ur: 388 mOsm/kg (ref 300–900)

## 2019-11-12 MED ORDER — GUAIFENESIN-DM 100-10 MG/5ML PO SYRP
5.0000 mL | ORAL_SOLUTION | ORAL | Status: DC | PRN
  Administered 2019-11-12: 5 mL via ORAL
  Filled 2019-11-12: qty 5

## 2019-11-12 NOTE — Progress Notes (Signed)
[facility]  SUBJECTIVE: Shin laying in bed, denies chest pain, but does complain of aching all over and feeling anxious   Vitals:   11/11/19 2034 11/12/19 0356 11/12/19 0400 11/12/19 0751  BP: (!) 184/97 (!) 157/78  (!) 162/82  Pulse: 83 82  72  Resp: 18   17  Temp: 99.2 F (37.3 C) 98.9 F (37.2 C)  97.8 F (36.6 C)  TempSrc: Oral Oral    SpO2: 97% 97%  97%  Weight:   68.9 kg   Height:         Intake/Output Summary (Last 24 hours) at 11/12/2019 [id number] Last data filed at 11/12/2019 0750 Gross per 24 hour  Intake 1614.3 ml  Output 450 ml  Net 1164.3 ml      PHYSICAL EXAM  General: Well developed, well nourished, in no acute distress HEENT:  Normocephalic and atramatic Neck:  No JVD.  Lungs: Clear bilaterally to auscultation and percussion. Heart: HRRR . Normal S1 and S2 without gallops or murmurs.  Abdomen: Bowel sounds are positive, abdomen soft and non-tender  Msk:  Back normal, normal gait. Normal strength and tone for age. Extremities: No clubbing, cyanosis or edema.   Neuro: Alert and oriented X 3. Psych:  Good affect, responds appropriately   LABS: Basic Metabolic Panel: Recent Labs    11/11/19 0610  NA 130*  K 3.9  CL 95*  CO2 25  GLUCOSE 119*  BUN 21  CREATININE 0.84  CALCIUM 9.4   Liver Function Tests: No results for input(s): AST, ALT, ALKPHOS, BILITOT, PROT, ALBUMIN in the last 72 hours. No results for input(s): LIPASE, AMYLASE in the last 72 hours. CBC: Recent Labs    11/11/19 0610  WBC 7.6  HGB 12.4  HCT 35.4*  MCV 91.0  PLT 277   Cardiac Enzymes: No results for input(s): CKTOTAL, CKMB, CKMBINDEX, TROPONINI in the last 72 hours. BNP: Invalid input(s): POCBNP D-Dimer: No results for input(s): DDIMER in the last 72 hours. Hemoglobin A1C: Recent Labs    11/12/19 0117  HGBA1C 5.8*   Fasting Lipid Panel: Recent Labs    11/12/19 0117  CHOL 140  HDL 67  LDLCALC 63  TRIG 48  CHOLHDL 2.1   Thyroid Function Tests: Recent  Labs    11/12/19 0117  TSH 3.320   Anemia Panel: No results for input(s): VITAMINB12, FOLATE, FERRITIN, TIBC, IRON, RETICCTPCT in the last 72 hours.  DG Chest 2 View  Result Date: 11/11/2019 CLINICAL DATA:  Hypertension, status post fall EXAM: CHEST - 2 VIEW COMPARISON:  09/18/2019 FINDINGS: The lungs are hyperinflated likely secondary to COPD. There is no focal consolidation. There is no pleural effusion or pneumothorax. The heart and mediastinal contours are unremarkable. There is no acute osseous abnormality. There is a left shoulder arthroplasty. IMPRESSION: No active cardiopulmonary disease. Electronically Signed   By: Kathreen Devoid   On: 11/11/2019 06:48     Echo LVEF greater than 65% by 2D echocardiogram 12/12/2018  TELEMETRY: Atrial fibrillation at a rate of 75 bpm:  ASSESSMENT AND PLAN:  Principal Problem:   Chest pain Active Problems:   Hyponatremia   HTN (hypertension)   COPD (chronic obstructive pulmonary disease) (HCC)   Atrial fibrillation (HCC)   HLD (hyperlipidemia)   GERD (gastroesophageal reflux disease)   Depression with anxiety   Iron deficiency anemia   Hypertensive urgency   Elevated troponin    1.  Chest pain, atypical, resolved, normal to borderline elevated high-sensitivity troponin, without delta, likely nonischemic, ECG  nondiagnostic 2.  Chronic atrial fibrillation, rate controlled, not on chronic anticoagulation, with history of GI bleed on aspirin and Plavix, currently on low-dose aspirin only  Recommendations  1.  Agree with current therapy 2.  Defer chronic anticoagulation for atrial fibrillation 3.  Defer further cardiac diagnostics at this time 4.  Follow-up with Dr. Ubaldo Glassing in 1 to 2 weeks 5.  Patient appears stable for discharge from cardiovascular perspective  Sign off for now, please call if any questions   Isaias Cowman, MD, PhD, [facility] 11/12/2019 9:04 AM

## 2019-11-12 NOTE — Discharge Summary (Signed)
Physician Discharge Summary  Janice Perkins [id number] DOB: 1933-07-17 DOA: 11/11/2019  PCP: [name], MD  Admit date: 11/11/2019 Discharge date: 11/12/2019  Admitted From: home Disposition: home  Recommendations for Outpatient Follow-up:  1. Follow up with PCP in 1 week 2. F/U cardio (Dr. Ubaldo Glassing) in 1-2 weeks  Home Health: no  Equipment/Devices:   Discharge Condition: stable CODE STATUS: full  Diet recommendation: Heart Healthy  Brief/Interim Summary: HPI was taken from Dr. Blaine Hamper: Janice Perkins is a 86 y.o. female with medical history significant of hypertension, hyperlipidemia, COPD, asthma, GERD, depression with anxiety, breast cancer, atrial fibrillation not on anticoagulants, iron deficiency anemia, GI bleeding, duodenal ulcer, who presents with chest pain, generalized weakness, elevated blood pressure.  Patient states that she has been taking her blood pressure medications consistently, but her blood pressure is elevated, yesterday her blood pressure was up to 190.  He also has intermittent chest pain, which is located in the lower chest, pressure-like, moderate, radiating to the left arm.  She has mild dry cough and mild shortness of breath, no fever or chills. She also has generalized weakness, no unilateral numbness or tingling his extremities.  Patient has nausea, but no vomiting, diarrhea, abdominal pain, symptoms of UTI.  No facial droop or slurred speech.  ED Course: pt was found to have troponin 18, WBC 7.6, pending COVID-19 PCR, sodium 130, renal function okay, temperature normal, blood pressure 177/89, heart rate is 74, RR 16, oxygen saturation 97% on room air.  Chest x-ray negative.  Patient is placed telemetry bed of observation.  Cardiology, Dr. Saralyn Pilar is consulted.  Hospital Course from Dr. Lenise Herald 11/12/19: Pt presented w/ chest pain, minimally elevated troponins so cardio was consulted. The chest pain was atypical and likely nonischemic as per cardio.  The chest pain resolved prior to d/c. No further inpatient cardiac work-up was indicated. Pt will f/u Dr. Ubaldo Glassing outpatient in 1-2 weeks.    Discharge Diagnoses:  Principal Problem:   Chest pain Active Problems:   Hyponatremia   HTN (hypertension)   COPD (chronic obstructive pulmonary disease) (HCC)   Atrial fibrillation (HCC)   HLD (hyperlipidemia)   GERD (gastroesophageal reflux disease)   Depression with anxiety   Iron deficiency anemia   Hypertensive urgency   Elevated troponin  Chest pain: troponins trending up slightly. Likely nonischemic as per cardio Nitro, morphine prn for pain. Continue on tele. Cardio following and recs apprec.  Hyponatremia: likely due to poor oral intake. Continue on IVFs. Trending up   HTN and Hypertensive urgency: continue home dose of clonidine, hydralazine, cozaar. Hydralazine prn  COPD: stable. Continue on bronchodilators  Atrial fibrillation: likely PAF. Not taking anticoagulants due to GI bleeding.  Patient used to be on Cardizem and metoprolol, currently is not taking these medications. Continue on tele   HLD: continue statin   GERD: w/ history of duodenal ulcer. Continue protonix, pepcid  Depression and anxiety: severity unknown. Continue on buspar  Iron deficiency anemia: continue iron supplement.   Discharge Instructions  Discharge Instructions    Diet - low sodium heart healthy   Complete by: As directed    Discharge instructions   Complete by: As directed    F/u PCP in 1 week; F/u cardio (Dr. Ubaldo Glassing) in 1-2 weeks   Increase activity slowly   Complete by: As directed      Allergies as of 11/12/2019      Reactions   Diphenhydramine Hcl Rash   Penicillins Hives   .Has patient  had a PCN reaction causing immediate rash, facial/tongue/throat swelling, SOB or lightheadedness with hypotension: Unknown Has patient had a PCN reaction causing severe rash involving mucus membranes or skin necrosis: Unknown Has patient had a PCN  reaction that required hospitalization: Unknown Has patient had a PCN reaction occurring within the last 10 years: Unknown If all of the above answers are "NO", then may proceed with Cephalosporin use.   Captopril Other (See Comments)   Eliquis [apixaban] Other (See Comments)   Stomach bleed   Enalapril Maleate Other (See Comments)   Other reaction(s): Unknown   Tape Itching   Terfenadine Other (See Comments)   Augmentin [amoxicillin-pot Clavulanate] Rash   Has patient had a PCN reaction causing immediate rash, facial/tongue/throat swelling, SOB or lightheadedness with hypotension: Unknown Has patient had a PCN reaction causing severe rash involving mucus membranes or skin necrosis: Unknown Has patient had a PCN reaction that required hospitalization: Unknown Has patient had a PCN reaction occurring within the last 10 years: Unknown If all of the above answers are "NO", then may proceed with Cephalosporin use.   Biaxin [clarithromycin] Rash, Other (See Comments), Hives   Other reaction(s): Unknown      Medication List    TAKE these medications   albuterol 108 (90 Base) MCG/ACT inhaler Commonly known as: VENTOLIN HFA Inhale 2 puffs into the lungs every 6 (six) hours as needed for wheezing or shortness of breath.   albuterol 1.25 MG/3ML nebulizer solution Commonly known as: ACCUNEB Inhale 3 mLs into the lungs every 6 (six) hours as needed for wheezing.   ALPRAZolam 0.5 MG tablet Commonly known as: XANAX Take 0.5 mg by mouth 2 (two) times daily as needed for anxiety or sleep.   alum & mag hydroxide-simeth 200-200-20 MG/5ML suspension Commonly known as: MAALOX/MYLANTA Take 30 mLs by mouth every 4 (four) hours as needed for indigestion or heartburn.   atorvastatin 40 MG tablet Commonly known as: LIPITOR Take 40 mg by mouth at bedtime.   busPIRone 10 MG tablet Commonly known as: BUSPAR Take 10 mg by mouth 2 (two) times daily.   cholecalciferol 1000 units tablet Commonly  known as: VITAMIN D Take 1,000 Units by mouth daily.   cloNIDine 0.1 MG tablet Commonly known as: CATAPRES Take 1 tablet (0.1 mg total) by mouth 3 (three) times daily.   diltiazem 180 MG 24 hr capsule Commonly known as: CARDIZEM CD Take 1 capsule (180 mg total) by mouth daily.   famotidine 20 MG tablet Commonly known as: PEPCID Take 20 mg by mouth 2 (two) times daily.   ferrous sulfate 325 (65 FE) MG tablet Take 325 mg by mouth daily with breakfast.   FLUoxetine 20 MG capsule Commonly known as: PROZAC Take 20 mg by mouth daily.   fluticasone 50 MCG/ACT nasal spray Commonly known as: FLONASE Place 2 sprays into both nostrils daily.   Fluticasone-Salmeterol 250-50 MCG/DOSE Aepb Commonly known as: ADVAIR Inhale 1 puff into the lungs 2 (two) times daily.   furosemide 20 MG tablet Commonly known as: LASIX Take 20 mg by mouth every other day.   hydrALAZINE 50 MG tablet Commonly known as: APRESOLINE Take 50 mg by mouth 3 (three) times daily.   HYDROcodone-acetaminophen 5-325 MG tablet Commonly known as: NORCO/VICODIN Take 1 tablet by mouth 2 (two) times daily as needed for moderate pain.   levothyroxine 50 MCG tablet Commonly known as: SYNTHROID Take 50 mcg by mouth daily before breakfast.   loratadine 10 MG tablet Commonly known as: CLARITIN Take  10 mg by mouth daily.   losartan 50 MG tablet Commonly known as: COZAAR Take 2 tablets (100 mg total) by mouth at bedtime. What changed: when to take this   magnesium oxide 400 MG tablet Commonly known as: MAG-OX Take 400 mg by mouth 2 (two) times daily.   metoprolol succinate 25 MG 24 hr tablet Commonly known as: TOPROL-XL Take 1 tablet (25 mg total) by mouth daily.   RABEprazole 20 MG tablet Commonly known as: ACIPHEX Take 20 mg by mouth 2 (two) times daily.   rOPINIRole 1 MG tablet Commonly known as: REQUIP Take 1 mg by mouth at bedtime.   simethicone 80 MG chewable tablet Commonly known as: MYLICON Chew  80 mg by mouth 2 (two) times daily as needed for flatulence.       Allergies  Allergen Reactions  . Diphenhydramine Hcl Rash  . Penicillins Hives    .Has patient had a PCN reaction causing immediate rash, facial/tongue/throat swelling, SOB or lightheadedness with hypotension: Unknown Has patient had a PCN reaction causing severe rash involving mucus membranes or skin necrosis: Unknown Has patient had a PCN reaction that required hospitalization: Unknown Has patient had a PCN reaction occurring within the last 10 years: Unknown If all of the above answers are "NO", then may proceed with Cephalosporin use.   . Captopril Other (See Comments)  . Eliquis [Apixaban] Other (See Comments)    Stomach bleed  . Enalapril Maleate Other (See Comments)    Other reaction(s): Unknown  . Tape Itching  . Terfenadine Other (See Comments)  . Augmentin [Amoxicillin-Pot Clavulanate] Rash    Has patient had a PCN reaction causing immediate rash, facial/tongue/throat swelling, SOB or lightheadedness with hypotension: Unknown Has patient had a PCN reaction causing severe rash involving mucus membranes or skin necrosis: Unknown Has patient had a PCN reaction that required hospitalization: Unknown Has patient had a PCN reaction occurring within the last 10 years: Unknown If all of the above answers are "NO", then may proceed with Cephalosporin use.  . Biaxin [Clarithromycin] Rash, Other (See Comments) and Hives    Other reaction(s): Unknown    Consultations:  Cardio, Dr. Saralyn Pilar   Procedures/Studies: DG Chest 2 View  Result Date: 11/11/2019 CLINICAL DATA:  Hypertension, status post fall EXAM: CHEST - 2 VIEW COMPARISON:  09/18/2019 FINDINGS: The lungs are hyperinflated likely secondary to COPD. There is no focal consolidation. There is no pleural effusion or pneumothorax. The heart and mediastinal contours are unremarkable. There is no acute osseous abnormality. There is a left shoulder arthroplasty.  IMPRESSION: No active cardiopulmonary disease. Electronically Signed   By: Kathreen Devoid   On: 11/11/2019 06:48   DG Abd 1 View  Result Date: 11/03/2019 CLINICAL DATA:  Constipation EXAM: ABDOMEN - 1 VIEW COMPARISON:  None. FINDINGS: Nonobstructive pattern of bowel gas. Large burden of stool in the left and right colon. No free air in the abdomen. IMPRESSION: Nonobstructive pattern of bowel gas. Large burden of stool in the left and right colon. Electronically Signed   By: Eddie Candle M.D.   On: 11/03/2019 10:02   CT Head Wo Contrast  Result Date: 10/30/2019 CLINICAL DATA:  Hypertension with headache EXAM: CT HEAD WITHOUT CONTRAST TECHNIQUE: Contiguous axial images were obtained from the base of the skull through the vertex without intravenous contrast. COMPARISON:  Head CT August 07, 2019 FINDINGS: Brain: Mild diffuse atrophy is stable. There is no evident intracranial mass, hemorrhage, extra-axial fluid collection, or midline shift. There is relatively mild  patchy small vessel disease in the centra semiovale bilaterally, stable. There is no evident acute infarct. Vascular: There is no hyperdense vessel. There is calcification in each carotid siphon region. Skull: The bony calvarium appears intact. Sinuses/Orbits: Visualized paranasal sinuses are clear. Visualized orbits appear symmetric bilaterally. Other: Mastoid air cells are clear. IMPRESSION: Stable mild atrophy with periventricular small vessel disease. No evident acute infarct. No mass or hemorrhage. There are foci of arterial vascular calcification. Electronically Signed   By: Lowella Grip III M.D.   On: 10/30/2019 08:44     Subjective: Pt c/o anxiety    Discharge Exam: Vitals:   11/12/19 0751 11/12/19 1149  BP: (!) 162/82 (!) 160/89  Pulse: 72 71  Resp: 17 16  Temp: 97.8 F (36.6 C) 97.8 F (36.6 C)  SpO2: 97% 97%   Vitals:   11/12/19 0356 11/12/19 0400 11/12/19 0751 11/12/19 1149  BP: (!) 157/78  (!) 162/82 (!) 160/89   Pulse: 82  72 71  Resp:   17 16  Temp: 98.9 F (37.2 C)  97.8 F (36.6 C) 97.8 F (36.6 C)  TempSrc: Oral     SpO2: 97%  97% 97%  Weight:  68.9 kg    Height:        General: Pt is alert, awake, not in acute distress.  Cardiovascular: irregularly irregular, no rubs, no gallops Respiratory: CTA bilaterally, no wheezing, no rales Abdominal: Soft, NT, ND, bowel sounds + Extremities: no edema, no cyanosis    The results of significant diagnostics from this hospitalization (including imaging, microbiology, ancillary and laboratory) are listed below for reference.     Microbiology: Recent Results (from the past 240 hour(s))  SARS CORONAVIRUS 2 (TAT 6-24 HRS) Nasopharyngeal Nasopharyngeal Swab     Status: None   Collection Time: 11/12/19  1:09 AM   Specimen: Nasopharyngeal Swab  Result Value Ref Range Status   SARS Coronavirus 2 NEGATIVE NEGATIVE Final    Comment: (NOTE) SARS-CoV-2 target nucleic acids are NOT DETECTED. The SARS-CoV-2 RNA is generally detectable in upper and lower respiratory specimens during the acute phase of infection. Negative results do not preclude SARS-CoV-2 infection, do not rule out co-infections with other pathogens, and should not be used as the sole basis for treatment or other patient management decisions. Negative results must be combined with clinical observations, patient history, and epidemiological information. The expected result is Negative. Fact Sheet for Patients: SugarRoll.be Fact Sheet for Healthcare Providers: https://www.woods-mathews.com/ This test is not yet approved or cleared by the [place] FDA and  has been authorized for detection and/or diagnosis of SARS-CoV-2 by FDA under an Emergency Use Authorization (EUA). This EUA will remain  in effect (meaning this test can be used) for the duration of the COVID-19 declaration under Section 56 4(b)(1) of the Act, 21 U.S.C. section  360bbb-3(b)(1), unless the authorization is terminated or revoked sooner. Performed at [facility], [place] [address], [place], [place] [address]      Labs: BNP (last 3 results) Recent Labs    10/12/19 0837 10/21/19 0920 11/11/19 0610  BNP 251.0* 124.0* [id number]*   Basic Metabolic Panel: Recent Labs  Lab 11/08/19 1514 11/11/19 0610 11/12/19 0912  NA 126* 130* 132*  K 3.9 3.9 3.5  CL 92* 95* 100  CO2 23 25 22   GLUCOSE 118* 119* 100*  BUN 13 21 12   CREATININE 0.82 0.84 0.71  CALCIUM 9.5 9.4 8.4*   Liver Function Tests: Recent Labs  Lab 11/08/19 1514  AST  36  ALT 26  ALKPHOS 40  BILITOT 1.1  PROT 7.4  ALBUMIN 4.3   No results for input(s): LIPASE, AMYLASE in the last 168 hours. No results for input(s): AMMONIA in the last 168 hours. CBC: Recent Labs  Lab 11/08/19 1514 11/11/19 0610 11/12/19 0912  WBC 8.8 7.6 7.2  HGB 12.1 12.4 12.0  HCT 34.6* 35.4* 34.5*  MCV 90.1 91.0 91.5  PLT 280 277 232   Cardiac Enzymes: No results for input(s): CKTOTAL, CKMB, CKMBINDEX, TROPONINI in the last 168 hours. BNP: Invalid input(s): POCBNP CBG: No results for input(s): GLUCAP in the last 168 hours. D-Dimer No results for input(s): DDIMER in the last 72 hours. Hgb A1c Recent Labs    11/12/19 0117  HGBA1C 5.8*   Lipid Profile Recent Labs    11/12/19 0117  CHOL 140  HDL 67  LDLCALC 63  TRIG 48  CHOLHDL 2.1   Thyroid function studies Recent Labs    11/12/19 0117  TSH 3.320   Anemia work up No results for input(s): VITAMINB12, FOLATE, FERRITIN, TIBC, IRON, RETICCTPCT in the last 72 hours. Urinalysis    Component Value Date/Time   COLORURINE YELLOW (A) 11/08/2019 1514   APPEARANCEUR HAZY (A) 11/08/2019 1514   LABSPEC 1.011 11/08/2019 1514   PHURINE 6.0 11/08/2019 1514   GLUCOSEU NEGATIVE 11/08/2019 1514   HGBUR NEGATIVE 11/08/2019 1514   BILIRUBINUR NEGATIVE 11/08/2019 1514   KETONESUR NEGATIVE 11/08/2019 1514   PROTEINUR NEGATIVE 11/08/2019  1514   NITRITE NEGATIVE 11/08/2019 1514   LEUKOCYTESUR LARGE (A) 11/08/2019 1514   Sepsis Labs Invalid input(s): PROCALCITONIN,  WBC,  LACTICIDVEN Microbiology Recent Results (from the past 240 hour(s))  SARS CORONAVIRUS 2 (TAT 6-24 HRS) Nasopharyngeal Nasopharyngeal Swab     Status: None   Collection Time: 11/12/19  1:09 AM   Specimen: Nasopharyngeal Swab  Result Value Ref Range Status   SARS Coronavirus 2 NEGATIVE NEGATIVE Final    Comment: (NOTE) SARS-CoV-2 target nucleic acids are NOT DETECTED. The SARS-CoV-2 RNA is generally detectable in upper and lower respiratory specimens during the acute phase of infection. Negative results do not preclude SARS-CoV-2 infection, do not rule out co-infections with other pathogens, and should not be used as the sole basis for treatment or other patient management decisions. Negative results must be combined with clinical observations, patient history, and epidemiological information. The expected result is Negative. Fact Sheet for Patients: SugarRoll.be Fact Sheet for Healthcare Providers: https://www.woods-mathews.com/ This test is not yet approved or cleared by the [place] FDA and  has been authorized for detection and/or diagnosis of SARS-CoV-2 by FDA under an Emergency Use Authorization (EUA). This EUA will remain  in effect (meaning this test can be used) for the duration of the COVID-19 declaration under Section 56 4(b)(1) of the Act, 21 U.S.C. section 360bbb-3(b)(1), unless the authorization is terminated or revoked sooner. Performed at [facility], [place] [address] [place], [place] [address]      Time coordinating discharge: Over 30 minutes  SIGNED:   Wyvonnia Dusky, MD  [facility] 11/12/2019, 12:20 PM Pager   If 7PM-7AM, please contact night-coverage [email or web address]

## 2019-11-12 NOTE — Progress Notes (Signed)
Patient adequate for discharge per Dr. Jimmye Norman. Patient home with husband Clair Gulling. Education complete, VSS.

## 2019-11-18 ENCOUNTER — Other Ambulatory Visit: Payer: Self-pay

## 2019-11-18 ENCOUNTER — Emergency Department
Admission: EM | Admit: 2019-11-18 | Discharge: 2019-11-18 | Disposition: A | Discharge status: Home / Self Care | Payer: Medicare Other | Attending: Emergency Medicine | Admitting: Emergency Medicine

## 2019-11-18 DIAGNOSIS — M549 Dorsalgia, unspecified: Secondary | ICD-10-CM | POA: Diagnosis not present

## 2019-11-18 DIAGNOSIS — I4891 Unspecified atrial fibrillation: Secondary | ICD-10-CM | POA: Diagnosis present

## 2019-11-18 DIAGNOSIS — Z5321 Procedure and treatment not carried out due to patient leaving prior to being seen by health care provider: Secondary | ICD-10-CM | POA: Diagnosis not present

## 2019-11-18 LAB — CBC
HCT: 34.2 % — ABNORMAL LOW (ref 36.0–46.0)
Hemoglobin: 11.5 g/dL — ABNORMAL LOW (ref 12.0–15.0)
MCH: 31.8 pg (ref 26.0–34.0)
MCHC: 33.6 g/dL (ref 30.0–36.0)
MCV: 94.5 fL (ref 80.0–100.0)
Platelets: 182 10*3/uL (ref 150–400)
RBC: 3.62 MIL/uL — ABNORMAL LOW (ref 3.87–5.11)
RDW: 13.7 % (ref 11.5–15.5)
WBC: 7 10*3/uL (ref 4.0–10.5)
nRBC: 0 % (ref 0.0–0.2)

## 2019-11-18 LAB — BASIC METABOLIC PANEL
Anion gap: 7 (ref 5–15)
BUN: 17 mg/dL (ref 8–23)
CO2: 28 mmol/L (ref 22–32)
Calcium: 9.5 mg/dL (ref 8.9–10.3)
Chloride: 99 mmol/L (ref 98–111)
Creatinine, Ser: 0.81 mg/dL (ref 0.44–1.00)
GFR calc Af Amer: >60 mL/min (ref >60)
GFR calc non Af Amer: >60 mL/min (ref >60)
Glucose, Bld: 125 mg/dL — ABNORMAL HIGH (ref 70–99)
Potassium: 3.9 mmol/L (ref 3.5–5.1)
Sodium: 134 mmol/L — ABNORMAL LOW (ref 135–145)

## 2019-11-18 LAB — TROPONIN I (HIGH SENSITIVITY): Troponin I (High Sensitivity): 15 ng/L (ref <18)

## 2019-11-18 NOTE — ED Notes (Signed)
Per tricia, ed tech, pt stated she was calling a ride and leaving.witnessed pt ambulate to front door and get in a car in no acute distress.

## 2019-11-18 NOTE — ED Triage Notes (Signed)
Pt states is here for "my a fib". Pt states she does not feel any better after her admission for a fib last week and would like to get checked out. Pt complains of back pain, leg pain that is chronic. Pt appears in no acute distress.

## 2019-11-18 NOTE — ED Notes (Signed)
Pt sitting in lobby in no acute distress.  

## 2019-11-18 NOTE — ED Notes (Signed)
Pt refused to get blood drawn and requested to call her husband to pick her up.

## 2020-01-25 ENCOUNTER — Ambulatory Visit
Admission: RE | Admit: 2020-01-25 | Discharge: 2020-01-25 | Disposition: A | Discharge status: Home / Self Care | Payer: Medicare Other | Source: Ambulatory Visit | Attending: Oncology | Admitting: Oncology

## 2020-01-25 DIAGNOSIS — Z1231 Encounter for screening mammogram for malignant neoplasm of breast: Secondary | ICD-10-CM | POA: Insufficient documentation

## 2020-01-25 DIAGNOSIS — C50411 Malignant neoplasm of upper-outer quadrant of right female breast: Secondary | ICD-10-CM | POA: Diagnosis not present

## 2020-01-27 NOTE — Progress Notes (Signed)
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  Leukocytosis.  INTERVAL HISTORY: Patient returns to clinic today for routine yearly evaluation.  She continues to have multiple medical complaints that are chronic and unchanged.  She has no neurologic complaints.  She denies any recent fevers or illnesses.  She has a good appetite and denies weight loss.  She denies any chest pain, shortness of breath, cough, or hemoptysis.  She denies any nausea, vomiting, constipation , or diarrhea. She has no urinary complaints.  Patient offers no further specific complaints today.  REVIEW OF SYSTEMS:   Review of Systems  Constitutional: Positive for malaise/fatigue. Negative for fever and weight loss.  Respiratory: Negative.  Negative for cough and shortness of breath.   Cardiovascular: Negative.  Negative for chest pain and leg swelling.  Gastrointestinal: Negative.  Negative for abdominal pain, blood in stool and melena.  Genitourinary: Negative.  Negative for dysuria.  Musculoskeletal: Positive for back pain. Negative for joint pain.  Skin: Negative.  Negative for rash.  Neurological: Positive for weakness. Negative for sensory change, focal weakness and headaches.  Psychiatric/Behavioral: Negative.  The patient is not nervous/anxious.     As per HPI. Otherwise, a complete review of systems is negative.  PAST MEDICAL HISTORY: Past Medical History:  Diagnosis Date  . Asthma   . Atrial fibrillation ([place])   . Breast cancer ([place]) 2011  . Bronchitis 04/2015  . Cancer [facility]) 2011   Right breast  . COPD (chronic obstructive pulmonary disease) ([place])   . Hypertension   . Personal history of radiation therapy  2011   Right breast  . Shingles    10/2015    PAST SURGICAL HISTORY: Past Surgical History:  Procedure Laterality Date  . BREAST EXCISIONAL BIOPSY Right 11/29/13   two areas FAT NECROSIS  . BREAST EXCISIONAL BIOPSY Right 10/30/2009   lumpectomy - radiation  . COLONOSCOPY WITH PROPOFOL N/A 06/10/2018   Procedure: COLONOSCOPY WITH PROPOFOL;  Surgeon: Manya Silvas, MD;  Location[facility] ENDOSCOPY;  Service: Endoscopy;  Laterality: N/A;  . COLONOSCOPY WITH PROPOFOL N/A 02/20/2019   Procedure: COLONOSCOPY WITH PROPOFOL;  Surgeon: Lucilla Lame, MD;  Location: [facility] ENDOSCOPY;  Service: Endoscopy;  Laterality: N/A;  . ESOPHAGOGASTRODUODENOSCOPY (EGD) WITH PROPOFOL N/A 06/10/2018   Procedure: ESOPHAGOGASTRODUODENOSCOPY (EGD) WITH PROPOFOL;  Surgeon: Manya Silvas, MD;  Location: [facility] ENDOSCOPY;  Service: Endoscopy;  Laterality: N/A;  . ESOPHAGOGASTRODUODENOSCOPY (EGD) WITH PROPOFOL N/A 02/20/2019   Procedure: ESOPHAGOGASTRODUODENOSCOPY (EGD) WITH PROPOFOL;  Surgeon: Lucilla Lame, MD;  Location: [facility] ENDOSCOPY;  Service: Endoscopy;  Laterality: N/A;  . NASAL SINUS SURGERY      FAMILY HISTORY Family History  Problem Relation Age of Onset  . Hypertension Mother   . Heart disease Mother   . Cancer Mother   . Stroke Father   . Hypertension Father   . Breast cancer Sister        ADVANCED DIRECTIVES:    HEALTH MAINTENANCE: Social History   Tobacco Use  . Smoking status: Never Smoker  . Smokeless tobacco: Never Used  Vaping Use  . Vaping Use: Never used  Substance Use Topics  . Alcohol use: No    Alcohol/week: 0.0 standard drinks  . Drug use: No  Colonoscopy:  PAP:  Bone density:  Lipid panel:  Allergies  Allergen Reactions  . Diphenhydramine Hcl Rash  . Penicillins Hives    .Has patient had a PCN reaction causing immediate rash, facial/tongue/throat swelling, SOB or lightheadedness with hypotension: Unknown Has patient had a PCN reaction causing severe rash involving  mucus membranes or skin necrosis: Unknown Has patient had a PCN reaction that required hospitalization: Unknown Has patient had a PCN reaction occurring within the last 10 years: Unknown If all of the above answers are "NO", then may proceed with Cephalosporin use.   . Captopril Other (See Comments)  . Eliquis [Apixaban] Other (See Comments)    Stomach bleed  . Enalapril Maleate Other (See Comments)    Other reaction(s): Unknown  . Tape Itching  . Terfenadine Other (See Comments)  . Augmentin [Amoxicillin-Pot Clavulanate] Rash    Has patient had a PCN reaction causing immediate rash, facial/tongue/throat swelling, SOB or lightheadedness with hypotension: Unknown Has patient had a PCN reaction causing severe rash involving mucus membranes or skin necrosis: Unknown Has patient had a PCN reaction that required hospitalization: Unknown Has patient had a PCN reaction occurring within the last 10 years: Unknown If all of the above answers are "NO", then may proceed with Cephalosporin use.  . Biaxin [Clarithromycin] Rash, Other (See Comments) and Hives    Other reaction(s): Unknown    Current Outpatient Medications  Medication Sig Dispense Refill  . albuterol (ACCUNEB) 1.25 MG/3ML nebulizer solution Inhale 3 mLs into the lungs every 6 (six) hours as needed for wheezing.     Marland Kitchen albuterol (PROVENTIL HFA;VENTOLIN HFA) 108 (90 Base) MCG/ACT inhaler Inhale 2 puffs into the lungs every 6 (six) hours as needed for wheezing or shortness of breath.     . ALPRAZolam (XANAX) 0.5 MG tablet Take 0.5 mg by mouth 2 (two) times daily as needed for anxiety or sleep.     Marland Kitchen alum & mag hydroxide-simeth (MAALOX/MYLANTA) 200-200-20 MG/5ML suspension Take 30 mLs by mouth every 4 (four) hours as needed for indigestion or heartburn. 355 mL 0  . atorvastatin (LIPITOR) 40 MG tablet Take 40 mg by mouth at bedtime.     . busPIRone (BUSPAR) 10 MG tablet Take 10 mg by mouth 2 (two) times daily.    . cholecalciferol (VITAMIN  D) 1000 units tablet Take 1,000 Units by mouth daily.    . cloNIDine (CATAPRES) 0.1 MG tablet Take 1 tablet (0.1 mg total) by mouth 3 (three) times daily. 60 tablet 1  . diltiazem (CARDIZEM CD) 180 MG 24 hr capsule Take 1 capsule (180 mg total) by mouth daily. (Patient not taking: Reported on 10/30/2019) 30 capsule 1  . famotidine (PEPCID) 20 MG tablet Take 20 mg by mouth 2 (two) times daily.    . ferrous sulfate 325 (65 FE) MG tablet Take 325 mg by mouth daily with breakfast.    . FLUoxetine (PROZAC) 20 MG capsule Take 20 mg by mouth daily.    . fluticasone (FLONASE) 50 MCG/ACT nasal spray Place 2 sprays into both nostrils daily.    . Fluticasone-Salmeterol (ADVAIR) 250-50 MCG/DOSE AEPB Inhale 1 puff into the lungs 2 (two) times daily.    . furosemide (LASIX) 20 MG tablet Take 20 mg by mouth every other day.    . hydrALAZINE (APRESOLINE) 50 MG tablet Take 50 mg by mouth 3 (three) times daily.    Marland Kitchen HYDROcodone-acetaminophen (NORCO/VICODIN) 5-325 MG tablet Take 1 tablet by mouth 2 (two) times daily as needed for moderate  pain.     . levothyroxine (SYNTHROID, LEVOTHROID) 50 MCG tablet Take 50 mcg by mouth daily before breakfast.    . loratadine (CLARITIN) 10 MG tablet Take 10 mg by mouth daily.    Marland Kitchen losartan (COZAAR) 50 MG tablet Take 2 tablets (100 mg total) by mouth at bedtime. (Patient taking differently: Take 100 mg by mouth daily. )    . magnesium oxide (MAG-OX) 400 MG tablet Take 400 mg by mouth 2 (two) times daily.    . metoprolol succinate (TOPROL-XL) 25 MG 24 hr tablet Take 1 tablet (25 mg total) by mouth daily. (Patient not taking: Reported on 10/30/2019) 30 tablet 0  . RABEprazole (ACIPHEX) 20 MG tablet Take 20 mg by mouth 2 (two) times daily.    Marland Kitchen rOPINIRole (REQUIP) 1 MG tablet Take 1 mg by mouth at bedtime.    . simethicone (MYLICON) 80 MG chewable tablet Chew 80 mg by mouth 2 (two) times daily as needed for flatulence.      No current facility-administered medications for this visit.      OBJECTIVE: Vitals:   01/30/20 1116  BP: 123/61  Pulse: 63  Resp: 20  Temp: (!) 97.2 F (36.2 C)  SpO2: 98%     Body mass index is 32.01 kg/m.    ECOG FS:1 - Symptomatic but completely ambulatory  General: Well-developed, well-nourished, no acute distress.  Sitting in a wheelchair. Eyes: Pink conjunctiva, anicteric sclera. HEENT: Normocephalic, moist mucous membranes. Lungs: No audible wheezing or coughing. Heart: Regular rate and rhythm. Abdomen: Soft, nontender, no obvious distention. Musculoskeletal: No edema, cyanosis, or clubbing. Neuro: Alert, answering all questions appropriately. Cranial nerves grossly intact. Skin: No rashes or petechiae noted. Psych: Normal affect.   LAB RESULTS:  Lab Results  Component Value Date   NA 134 (L) 11/18/2019   K 3.9 11/18/2019   CL 99 11/18/2019   CO2 28 11/18/2019   GLUCOSE 125 (H) 11/18/2019   BUN 17 11/18/2019   CREATININE 0.81 11/18/2019   CALCIUM 9.5 11/18/2019   PROT 7.4 11/08/2019   ALBUMIN 4.3 11/08/2019   AST 36 11/08/2019   ALT 26 11/08/2019   ALKPHOS 40 11/08/2019   BILITOT 1.1 11/08/2019   GFRNONAA >60 11/18/2019   GFRAA >60 11/18/2019    Lab Results  Component Value Date   WBC 7.0 11/18/2019   NEUTROABS 5.0 10/30/2019   HGB 11.5 (L) 11/18/2019   HCT 34.2 (L) 11/18/2019   MCV 94.5 11/18/2019   PLT 182 11/18/2019     STUDIES: MM 3D SCREEN BREAST BILATERAL  Result Date: 01/29/2020 CLINICAL DATA:  Screening. EXAM: DIGITAL SCREENING BILATERAL MAMMOGRAM WITH TOMO AND CAD COMPARISON:  Previous exam(s). ACR Breast Density Category c: The breast tissue is heterogeneously dense, which may obscure small masses. FINDINGS: There are no findings suspicious for malignancy. Images were processed with CAD. IMPRESSION: No mammographic evidence of malignancy. A result letter of this screening mammogram will be mailed directly to the patient. RECOMMENDATION: Screening mammogram in one year. (Code:SM-B-01Y) BI-RADS  CATEGORY  1: Negative. Electronically Signed   By: Lovey Newcomer M.D.   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  Leukocytosis.   PLAN:    1. 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  Her most recent mammogram on January 25, 2020  was reported as BI-RADS 1.  Patient is now over 10 years removed from her diagnosis and can be discharged from clinic.  Continue yearly mammograms which can be ordered by primary care.  Please refer patient back if there are any questions or concerns.  2.  Osteopenia: Patient's most recent bone mineral density on Jan 15, 2015 reported T score of -1.1.  Continue follow-up and monitoring by her primary care physician.   3.  Leukocytosis: Resolved.  CT scan results from March 29, 2018 reviewed independently with no obvious evidence of infection or pneumonia.   4.  Lymphedema: Patient does not complain of this today.  She was previously given a referral to lymphedema clinic.  I spent a total of 20 minutes reviewing chart data, face-to-face evaluation with the patient, counseling and coordination of care as detailed above.   Patient expressed understanding and was in agreement with this plan. She also understands that She can call clinic at any time with any questions, concerns, or complaints.    Lloyd Huger, MD   01/31/2020 6:41 AM

## 2020-01-30 ENCOUNTER — Encounter: Payer: Self-pay | Admitting: Oncology

## 2020-01-30 ENCOUNTER — Other Ambulatory Visit: Payer: Self-pay

## 2020-01-30 ENCOUNTER — Inpatient Hospital Stay: Payer: Medicare Other | Attending: Oncology | Admitting: Oncology

## 2020-01-30 VITALS — BP 123/61 | HR 63 | Temp 97.2°F | Resp 20 | Wt 163.9 lb

## 2020-01-30 DIAGNOSIS — C50411 Malignant neoplasm of upper-outer quadrant of right female breast: Secondary | ICD-10-CM

## 2020-01-30 DIAGNOSIS — Z7951 Long term (current) use of inhaled steroids: Secondary | ICD-10-CM | POA: Insufficient documentation

## 2020-01-30 DIAGNOSIS — I4891 Unspecified atrial fibrillation: Secondary | ICD-10-CM | POA: Insufficient documentation

## 2020-01-30 DIAGNOSIS — Z170 Estrogen receptor positive status [ER+]: Secondary | ICD-10-CM | POA: Diagnosis not present

## 2020-01-30 DIAGNOSIS — Z803 Family history of malignant neoplasm of breast: Secondary | ICD-10-CM | POA: Diagnosis not present

## 2020-01-30 DIAGNOSIS — D72829 Elevated white blood cell count, unspecified: Secondary | ICD-10-CM | POA: Insufficient documentation

## 2020-01-30 DIAGNOSIS — I10 Essential (primary) hypertension: Secondary | ICD-10-CM | POA: Diagnosis not present

## 2020-01-30 DIAGNOSIS — Z853 Personal history of malignant neoplasm of breast: Secondary | ICD-10-CM | POA: Diagnosis present

## 2020-01-30 DIAGNOSIS — M8580 Other specified disorders of bone density and structure, unspecified site: Secondary | ICD-10-CM | POA: Diagnosis not present

## 2020-01-30 DIAGNOSIS — Z79899 Other long term (current) drug therapy: Secondary | ICD-10-CM | POA: Diagnosis not present

## 2020-01-30 DIAGNOSIS — J449 Chronic obstructive pulmonary disease, unspecified: Secondary | ICD-10-CM | POA: Insufficient documentation

## 2020-01-30 NOTE — Progress Notes (Signed)
Patient here today for 1 year follow up. States she does have some pain in right breast area at night but believes it comes from scar tissue. Denies pain or other concerns at this time.

## 2020-02-16 ENCOUNTER — Other Ambulatory Visit: Payer: Self-pay | Admitting: Family Medicine

## 2020-02-16 ENCOUNTER — Ambulatory Visit
Admission: RE | Admit: 2020-02-16 | Discharge: 2020-02-16 | Disposition: A | Discharge status: Home / Self Care | Payer: Medicare Other | Source: Ambulatory Visit | Attending: Family Medicine | Admitting: Family Medicine

## 2020-02-16 ENCOUNTER — Other Ambulatory Visit: Payer: Self-pay

## 2020-02-16 DIAGNOSIS — R600 Localized edema: Secondary | ICD-10-CM

## 2020-03-17 ENCOUNTER — Emergency Department: Payer: Medicare Other

## 2020-03-17 ENCOUNTER — Emergency Department
Admission: EM | Admit: 2020-03-17 | Discharge: 2020-03-17 | Disposition: A | Discharge status: Other Acute Inpatient Hospital | Payer: Medicare Other | Attending: Emergency Medicine | Admitting: Emergency Medicine

## 2020-03-17 ENCOUNTER — Other Ambulatory Visit: Payer: Self-pay

## 2020-03-17 DIAGNOSIS — J45909 Unspecified asthma, uncomplicated: Secondary | ICD-10-CM | POA: Diagnosis not present

## 2020-03-17 DIAGNOSIS — E039 Hypothyroidism, unspecified: Secondary | ICD-10-CM | POA: Diagnosis not present

## 2020-03-17 DIAGNOSIS — Z79899 Other long term (current) drug therapy: Secondary | ICD-10-CM | POA: Diagnosis not present

## 2020-03-17 DIAGNOSIS — C50919 Malignant neoplasm of unspecified site of unspecified female breast: Secondary | ICD-10-CM | POA: Insufficient documentation

## 2020-03-17 DIAGNOSIS — I160 Hypertensive urgency: Secondary | ICD-10-CM | POA: Diagnosis not present

## 2020-03-17 DIAGNOSIS — R4182 Altered mental status, unspecified: Secondary | ICD-10-CM | POA: Insufficient documentation

## 2020-03-17 DIAGNOSIS — Z20822 Contact with and (suspected) exposure to covid-19: Secondary | ICD-10-CM | POA: Diagnosis not present

## 2020-03-17 DIAGNOSIS — I10 Essential (primary) hypertension: Secondary | ICD-10-CM | POA: Insufficient documentation

## 2020-03-17 DIAGNOSIS — J189 Pneumonia, unspecified organism: Secondary | ICD-10-CM | POA: Diagnosis not present

## 2020-03-17 DIAGNOSIS — I4891 Unspecified atrial fibrillation: Secondary | ICD-10-CM | POA: Insufficient documentation

## 2020-03-17 DIAGNOSIS — J449 Chronic obstructive pulmonary disease, unspecified: Secondary | ICD-10-CM | POA: Insufficient documentation

## 2020-03-17 DIAGNOSIS — J9601 Acute respiratory failure with hypoxia: Secondary | ICD-10-CM | POA: Diagnosis not present

## 2020-03-17 DIAGNOSIS — R0602 Shortness of breath: Secondary | ICD-10-CM | POA: Diagnosis present

## 2020-03-17 LAB — COMPREHENSIVE METABOLIC PANEL
ALT: 25 U/L (ref 0–44)
AST: 49 U/L — ABNORMAL HIGH (ref 15–41)
Albumin: 4.3 g/dL (ref 3.5–5.0)
Alkaline Phosphatase: 91 U/L (ref 38–126)
Anion gap: 18 — ABNORMAL HIGH (ref 5–15)
BUN: 19 mg/dL (ref 8–23)
CO2: 22 mmol/L (ref 22–32)
Calcium: 9.1 mg/dL (ref 8.9–10.3)
Chloride: 89 mmol/L — ABNORMAL LOW (ref 98–111)
Creatinine, Ser: 1.23 mg/dL — ABNORMAL HIGH (ref 0.44–1.00)
GFR calc Af Amer: 46 mL/min — ABNORMAL LOW (ref >60)
GFR calc non Af Amer: 40 mL/min — ABNORMAL LOW (ref >60)
Glucose, Bld: 237 mg/dL — ABNORMAL HIGH (ref 70–99)
Potassium: 3.9 mmol/L (ref 3.5–5.1)
Sodium: 129 mmol/L — ABNORMAL LOW (ref 135–145)
Total Bilirubin: 1.3 mg/dL — ABNORMAL HIGH (ref 0.3–1.2)
Total Protein: 8.3 g/dL — ABNORMAL HIGH (ref 6.5–8.1)

## 2020-03-17 LAB — URINALYSIS, COMPLETE (UACMP) WITH MICROSCOPIC
Bilirubin Urine: NEGATIVE
Glucose, UA: NEGATIVE mg/dL
Hgb urine dipstick: NEGATIVE
Ketones, ur: NEGATIVE mg/dL
Leukocytes,Ua: NEGATIVE
Nitrite: NEGATIVE
Protein, ur: 100 mg/dL — AB
Specific Gravity, Urine: 1.01 (ref 1.005–1.030)
Squamous Epithelial / HPF: NONE SEEN (ref 0–5)
pH: 5 (ref 5.0–8.0)

## 2020-03-17 LAB — CBC WITH DIFFERENTIAL/PLATELET
Abs Immature Granulocytes: 0.12 10*3/uL — ABNORMAL HIGH (ref 0.00–0.07)
Basophils Absolute: 0.1 10*3/uL (ref 0.0–0.1)
Basophils Relative: 1 %
Eosinophils Absolute: 0.6 10*3/uL — ABNORMAL HIGH (ref 0.0–0.5)
Eosinophils Relative: 3 %
HCT: 44.6 % (ref 36.0–46.0)
Hemoglobin: 13.7 g/dL (ref 12.0–15.0)
Immature Granulocytes: 1 %
Lymphocytes Relative: 43 %
Lymphs Abs: 7.7 10*3/uL — ABNORMAL HIGH (ref 0.7–4.0)
MCH: 28.7 pg (ref 26.0–34.0)
MCHC: 30.7 g/dL (ref 30.0–36.0)
MCV: 93.5 fL (ref 80.0–100.0)
Monocytes Absolute: 1.5 10*3/uL — ABNORMAL HIGH (ref 0.1–1.0)
Monocytes Relative: 8 %
Neutro Abs: 8 10*3/uL — ABNORMAL HIGH (ref 1.7–7.7)
Neutrophils Relative %: 44 %
Platelets: 216 10*3/uL (ref 150–400)
RBC: 4.77 MIL/uL (ref 3.87–5.11)
RDW: 14.4 % (ref 11.5–15.5)
Smear Review: NORMAL
WBC: 17.9 10*3/uL — ABNORMAL HIGH (ref 4.0–10.5)
nRBC: 0 % (ref 0.0–0.2)

## 2020-03-17 LAB — BLOOD GAS, ARTERIAL
Acid-base deficit: 5.5 mmol/L — ABNORMAL HIGH (ref 0.0–2.0)
Allens test (pass/fail): POSITIVE — AB
Bicarbonate: 22.6 mmol/L (ref 20.0–28.0)
FIO2: 1
MECHVT: 500 mL
O2 Saturation: 99.3 %
PEEP: 5 cmH2O
Patient temperature: 37
RATE: 18 resp/min
pCO2 arterial: 54 mmHg — ABNORMAL HIGH (ref 32.0–48.0)
pH, Arterial: 7.23 — ABNORMAL LOW (ref 7.350–7.450)
pO2, Arterial: 170 mmHg — ABNORMAL HIGH (ref 83.0–108.0)

## 2020-03-17 LAB — BRAIN NATRIURETIC PEPTIDE: B Natriuretic Peptide: 869.9 pg/mL — ABNORMAL HIGH (ref 0.0–100.0)

## 2020-03-17 LAB — TROPONIN I (HIGH SENSITIVITY): Troponin I (High Sensitivity): 30 ng/L — ABNORMAL HIGH (ref <18)

## 2020-03-17 LAB — SARS CORONAVIRUS 2 BY RT PCR (HOSPITAL ORDER, PERFORMED IN ~~LOC~~ HOSPITAL LAB): SARS Coronavirus 2: NEGATIVE

## 2020-03-17 LAB — PROTIME-INR
INR: 1.1 (ref 0.8–1.2)
Prothrombin Time: 13.9 seconds (ref 11.4–15.2)

## 2020-03-17 LAB — LACTIC ACID, PLASMA
Lactic Acid, Venous: 9.8 mmol/L (ref 0.5–1.9)
Lactic Acid, Venous: 4.4 mmol/L (ref 0.5–1.9)

## 2020-03-17 MED ORDER — IPRATROPIUM-ALBUTEROL 0.5-2.5 (3) MG/3ML IN SOLN
3.0000 mL | Freq: Once | RESPIRATORY_TRACT | Status: AC
  Administered 2020-03-17: 3 mL via RESPIRATORY_TRACT
  Filled 2020-03-17: qty 3

## 2020-03-17 MED ORDER — LACTATED RINGERS IV BOLUS (SEPSIS)
1000.0000 mL | Freq: Once | INTRAVENOUS | Status: AC
  Administered 2020-03-17: 1000 mL via INTRAVENOUS

## 2020-03-17 MED ORDER — MIDAZOLAM 50MG/50ML (1MG/ML) PREMIX INFUSION
0.5000 mg/h | INTRAVENOUS | Status: DC
  Administered 2020-03-17: 0.5 mg/h via INTRAVENOUS
  Filled 2020-03-17: qty 50

## 2020-03-17 MED ORDER — NOREPINEPHRINE 4 MG/250ML-% IV SOLN
0.0000 ug/min | INTRAVENOUS | Status: DC
  Administered 2020-03-17: 5 ug/min via INTRAVENOUS

## 2020-03-17 MED ORDER — ETOMIDATE 2 MG/ML IV SOLN
INTRAVENOUS | Status: AC | PRN
  Administered 2020-03-17: 20 mg via INTRAVENOUS

## 2020-03-17 MED ORDER — SUCCINYLCHOLINE CHLORIDE 20 MG/ML IJ SOLN
INTRAMUSCULAR | Status: AC | PRN
  Administered 2020-03-17: 100 mg via INTRAVENOUS

## 2020-03-17 MED ORDER — FENTANYL 2500MCG IN NS 250ML (10MCG/ML) PREMIX INFUSION
0.0000 ug/h | INTRAVENOUS | Status: DC
  Administered 2020-03-17: 25 ug/h via INTRAVENOUS
  Filled 2020-03-17: qty 250

## 2020-03-17 MED ORDER — LORAZEPAM 2 MG/ML IJ SOLN
2.0000 mg | Freq: Once | INTRAMUSCULAR | Status: AC
  Administered 2020-03-17: 2 mg via INTRAVENOUS
  Filled 2020-03-17: qty 1

## 2020-03-17 MED ORDER — LEVOFLOXACIN IN D5W 750 MG/150ML IV SOLN
750.0000 mg | Freq: Once | INTRAVENOUS | Status: AC
  Administered 2020-03-17: 750 mg via INTRAVENOUS
  Filled 2020-03-17: qty 150

## 2020-03-17 MED ORDER — VANCOMYCIN HCL 2000 MG/400ML IV SOLN
2000.0000 mg | Freq: Once | INTRAVENOUS | Status: AC
  Administered 2020-03-17: 2000 mg via INTRAVENOUS
  Filled 2020-03-17: qty 400

## 2020-03-17 MED ORDER — LACTATED RINGERS IV SOLN: INTRAVENOUS | Status: DC

## 2020-03-17 MED ORDER — METHYLPREDNISOLONE SODIUM SUCC 125 MG IJ SOLR
125.0000 mg | Freq: Once | INTRAMUSCULAR | Status: AC
  Administered 2020-03-17: 125 mg via INTRAVENOUS
  Filled 2020-03-17: qty 2

## 2020-03-17 MED ORDER — FUROSEMIDE 10 MG/ML IJ SOLN
20.0000 mg | Freq: Once | INTRAMUSCULAR | Status: AC
  Administered 2020-03-17: 20 mg via INTRAVENOUS

## 2020-03-17 MED ORDER — VANCOMYCIN HCL IN DEXTROSE 1-5 GM/200ML-% IV SOLN: 1000.0000 mg | Freq: Once | INTRAVENOUS | Status: DC

## 2020-03-17 MED ORDER — LORAZEPAM 2 MG/ML IJ SOLN
1.0000 mg | Freq: Once | INTRAMUSCULAR | Status: AC
  Administered 2020-03-17: 1 mg via INTRAVENOUS
  Filled 2020-03-17: qty 1

## 2020-03-17 MED ORDER — NOREPINEPHRINE 4 MG/250ML-% IV SOLN
INTRAVENOUS | Status: AC
  Filled 2020-03-17: qty 250

## 2020-03-17 MED ORDER — FUROSEMIDE 10 MG/ML IJ SOLN
INTRAMUSCULAR | Status: AC
  Filled 2020-03-17: qty 4

## 2020-03-17 MED ORDER — PROPOFOL 1000 MG/100ML IV EMUL
5.0000 ug/kg/min | INTRAVENOUS | Status: DC
  Administered 2020-03-17: 5 ug/kg/min via INTRAVENOUS
  Filled 2020-03-17: qty 100

## 2020-03-17 NOTE — ED Notes (Signed)
X-ray at bedside

## 2020-03-17 NOTE — ED Notes (Signed)
Bear hugger discontinued.

## 2020-03-17 NOTE — ED Notes (Signed)
Pt's son signed for consent to transfer to [facility].

## 2020-03-17 NOTE — ED Notes (Signed)
Belongings: underwear, shorts, shirt, upper and lower dentures, one gold ring

## 2020-03-17 NOTE — ED Notes (Signed)
Bair hugger applied.

## 2020-03-17 NOTE — ED Notes (Signed)
[facility] states they will not take the versed drip and fentanyl. Will stop on transfer to their care and waste excess.

## 2020-03-17 NOTE — Progress Notes (Signed)
CODE SEPSIS - PHARMACY COMMUNICATION  **Broad Spectrum Antibiotics should be administered within 1 hour of Sepsis diagnosis**  Time Code Sepsis Called/Page Received: 8/1 @ 1747  Antibiotics Ordered:  Levaquin 750 mg   Time of 1st antibiotic administration: 8/1 @ 17:30  Additional action taken by pharmacy:   If necessary, Name of Provider/Nurse Contacted:     Chayse Zatarain D ,PharmD Clinical Pharmacist  03/17/2020  6:15 PM

## 2020-03-17 NOTE — Progress Notes (Signed)
PHARMACY -  BRIEF ANTIBIOTIC NOTE   Pharmacy has received consult(s) for Vancomycin from an ED provider.  The patient's profile has been reviewed for ht/wt/allergies/indication/available labs.    One time order(s) placed for Vanc 2 gm IV X 1   Further antibiotics/pharmacy consults should be ordered by admitting physician if indicated.                       Thank you, Carley Strickling D 03/17/2020  5:44 PM

## 2020-03-17 NOTE — ED Notes (Signed)
Husband and son updated.

## 2020-03-17 NOTE — ED Notes (Signed)
Pt suctioned inline of approx 48ml clear to pink tinged fluid.

## 2020-03-17 NOTE — ED Notes (Signed)
RT at bedside adjusted vent setting for pox of 80s.

## 2020-03-17 NOTE — ED Notes (Signed)
[facility] here, md at bedside.

## 2020-03-17 NOTE — ED Notes (Signed)
Pt inline suctioned of approx 51ml of bloody yellow serous secretions

## 2020-03-17 NOTE — ED Provider Notes (Signed)
[facility] Emergency Department Provider Note   ____________________________________________    I have reviewed the triage vital signs and the nursing notes.   HISTORY  Chief Complaint Shortness of Breath  History limited by altered mental status and respiratory distress   HPI Janice Perkins is a 86 y.o. female with history of atrial fibrillation, COPD who presents with shortness of breath and altered mental status.  EMS reports patient initially called out for shortness of breath, started on CPAP but became unresponsive, bagging upon arrival.  Past Medical History:  Diagnosis Date  . Asthma   . Atrial fibrillation ([place])   . Breast cancer ([place]) 2011  . Bronchitis 04/2015  . Cancer [facility]) 2011   Right breast  . COPD (chronic obstructive pulmonary disease) ([place])   . Hypertension   . Personal history of radiation therapy 2011   Right breast  . Shingles    10/2015    Patient Active Problem List   Diagnosis Date Noted  . Chest pain 11/11/2019  . COPD (chronic obstructive pulmonary disease) ([place])   . Atrial fibrillation ([place])   . HLD (hyperlipidemia)   . GERD (gastroesophageal reflux disease)   . Depression with anxiety   . Iron deficiency anemia   . Hypertensive urgency   . Elevated troponin   . Acute encephalopathy 08/07/2019  . Fluid overload 06/09/2019  . Bronchitis 05/31/2019  . Hypothyroidism 05/31/2019  . Duodenum ulcer   . Melena   . GI bleed 02/17/2019  . Hypertensive encephalopathy 12/11/2018  . Hypoxia 05/30/2018  . Leukocytosis 04/05/2018  . Pneumonia 11/15/2017  . Pleural effusion 05/16/2017  . AF (paroxysmal atrial fibrillation) ([place]) 05/16/2017  . Breast cancer ([place]) 05/16/2017  . Dependence on nocturnal oxygen therapy 05/16/2017  . HTN (hypertension) 05/16/2017  . Generalized weakness 03/07/2017  . Hyponatremia 03/07/2017  . Dehydration 03/07/2017  . UTI (urinary tract infection) 03/07/2017  . HCAP  (healthcare-associated pneumonia) 01/19/2017  . AKI (acute kidney injury) ([place]) 12/18/2016  . Primary cancer of upper outer quadrant of right female breast ([place]) 04/07/2016  . Lumbar radiculopathy 03/23/2016  . Spinal stenosis, lumbar region, with neurogenic claudication 03/23/2016  . DDD (degenerative disc disease), lumbar 01/14/2015  . Lumbosacral facet joint syndrome 01/14/2015  . Sacroiliac joint dysfunction 01/14/2015  . Greater trochanteric bursitis 01/14/2015  . Bilateral occipital neuralgia 01/14/2015    Past Surgical History:  Procedure Laterality Date  . BREAST EXCISIONAL BIOPSY Right 11/29/13   two areas FAT NECROSIS  . BREAST EXCISIONAL BIOPSY Right 10/30/2009   lumpectomy - radiation  . COLONOSCOPY WITH PROPOFOL N/A 06/10/2018   Procedure: COLONOSCOPY WITH PROPOFOL;  Surgeon: Manya Silvas, MD;  Location: [facility] ENDOSCOPY;  Service: Endoscopy;  Laterality: N/A;  . COLONOSCOPY WITH PROPOFOL N/A 02/20/2019   Procedure: COLONOSCOPY WITH PROPOFOL;  Surgeon: Lucilla Lame, MD;  Location: [facility] ENDOSCOPY;  Service: Endoscopy;  Laterality: N/A;  . ESOPHAGOGASTRODUODENOSCOPY (EGD) WITH PROPOFOL N/A 06/10/2018   Procedure: ESOPHAGOGASTRODUODENOSCOPY (EGD) WITH PROPOFOL;  Surgeon: Manya Silvas, MD;  Location: [facility] ENDOSCOPY;  Service: Endoscopy;  Laterality: N/A;  . ESOPHAGOGASTRODUODENOSCOPY (EGD) WITH PROPOFOL N/A 02/20/2019   Procedure: ESOPHAGOGASTRODUODENOSCOPY (EGD) WITH PROPOFOL;  Surgeon: Lucilla Lame, MD;  Location: [facility] ENDOSCOPY;  Service: Endoscopy;  Laterality: N/A;  . NASAL SINUS SURGERY      Prior to Admission medications   Medication Sig Start Date End Date Taking? Authorizing Provider  albuterol (ACCUNEB) 1.25 MG/3ML nebulizer solution Inhale 3 mLs into the lungs every 6 (six) hours  as needed for wheezing.    Yes [provider]  albuterol (PROVENTIL HFA;VENTOLIN HFA) 108 (90 Base) MCG/ACT inhaler Inhale 2 puffs into the lungs every 6 (six) hours as needed  for wheezing or shortness of breath.    Yes [provider]  ALPRAZolam Duanne Moron) 0.5 MG tablet Take 0.5 mg by mouth 2 (two) times daily as needed for anxiety or sleep.    Yes [provider]  alum & mag hydroxide-simeth (MAALOX/MYLANTA) 200-200-20 MG/5ML suspension Take 30 mLs by mouth every 4 (four) hours as needed for indigestion or heartburn. 06/08/19  Yes Demetrios Loll, MD  atorvastatin (LIPITOR) 40 MG tablet Take 40 mg by mouth at bedtime.    Yes [provider]  busPIRone (BUSPAR) 10 MG tablet Take 10 mg by mouth 2 (two) times daily.   Yes [provider]  cholecalciferol (VITAMIN D) 1000 units tablet Take 1,000 Units by mouth daily.   Yes [provider]  cloNIDine (CATAPRES) 0.1 MG tablet Take 1 tablet (0.1 mg total) by mouth 3 (three) times daily. 08/08/19  Yes Nicole Kindred A, DO  diltiazem (DILACOR XR) 120 MG 24 hr capsule Take 120 mg by mouth daily.   Yes [provider]  famotidine (PEPCID) 20 MG tablet Take 20 mg by mouth 2 (two) times daily. 07/29/19  Yes [provider]  ferrous sulfate 325 (65 FE) MG tablet Take 325 mg by mouth daily with breakfast.   Yes [provider]  FLUoxetine (PROZAC) 20 MG capsule Take 20 mg by mouth daily.   Yes [provider]  fluticasone (FLONASE) 50 MCG/ACT nasal spray Place 2 sprays into both nostrils daily. 10/17/19  Yes [provider]  Fluticasone-Salmeterol (ADVAIR) 250-50 MCG/DOSE AEPB Inhale 1 puff into the lungs 2 (two) times daily.   Yes [provider]  furosemide (LASIX) 20 MG tablet Take 20 mg by mouth every other day.   Yes [provider]  hydrALAZINE (APRESOLINE) 50 MG tablet Take 50 mg by mouth 3 (three) times daily. 07/10/19  Yes [provider]  HYDROcodone-acetaminophen (NORCO/VICODIN) 5-325 MG tablet Take 1 tablet by mouth 2 (two) times daily as needed for moderate pain.    Yes [provider]  levothyroxine  (SYNTHROID, LEVOTHROID) 50 MCG tablet Take 50 mcg by mouth daily before breakfast.   Yes [provider]  loratadine (CLARITIN) 10 MG tablet Take 10 mg by mouth daily.   Yes [provider]  losartan (COZAAR) 100 MG tablet Take 100 mg by mouth daily.   Yes [provider]  magnesium oxide (MAG-OX) 400 MG tablet Take 400 mg by mouth 2 (two) times daily.   Yes [provider]  RABEprazole (ACIPHEX) 20 MG tablet Take 20 mg by mouth 2 (two) times daily.   Yes [provider]  simethicone (MYLICON) 80 MG chewable tablet Chew 80 mg by mouth 2 (two) times daily as needed for flatulence.    Yes [provider]     Allergies Diphenhydramine hcl, Penicillins, Captopril, Eliquis [apixaban], Enalapril maleate, Tape, Terfenadine, Augmentin [amoxicillin-pot clavulanate], and Biaxin [clarithromycin]  Family History  Problem Relation Age of Onset  . Hypertension Mother   . Heart disease Mother   . Cancer Mother   . Stroke Father   . Hypertension Father   . Breast cancer Sister     Social History Social History   Tobacco Use  . Smoking status: Never Smoker  . Smokeless tobacco: Never Used  Vaping Use  .  Vaping Use: Never used  Substance Use Topics  . Alcohol use: No    Alcohol/week: 0.0 standard drinks  . Drug use: No    Unable to obtain review of Systems due to altered mental status     ____________________________________________   PHYSICAL EXAM:  VITAL SIGNS: ED Triage Vitals [03/17/20 1631]  Enc Vitals Group     BP      Pulse      Resp      Temp      Temp src      SpO2      Weight 85.8 kg (189 lb 3.2 oz)     Height 1.575 m (5\' 2" )     Head Circumference      Peak Flow      Pain Score      Pain Loc      Pain Edu?      Excl. in [place]?     Constitutional: Unresponsive  Head: Atraumatic. Nose: No epistaxis Mouth/Throat: Mucous membranes are moist.    Cardiovascular: Tachycardia, regular rhythm. Grossly normal  heart sounds.  Good peripheral circulation. Respiratory: Tachypnea, prolonged expiratory phase Gastrointestinal: Soft and nontender. No distention.  No CVA tenderness.  Musculoskeletal: No lower extremity tenderness nor edema.  Warm and well perfused Neurologic: Unable to evaluate Skin:  Skin is warm, dry and intact. No rash noted. Psychiatric: Unable to evaluate  ____________________________________________   LABS (all labs ordered are listed, but only abnormal results are displayed)  Labs Reviewed  COMPREHENSIVE METABOLIC PANEL - Abnormal; Notable for the following components:      Result Value   Sodium 129 (*)    Chloride 89 (*)    Glucose, Bld 237 (*)    Creatinine, Ser 1.23 (*)    Total Protein 8.3 (*)    AST 49 (*)    Total Bilirubin 1.3 (*)    GFR calc non Af Amer 40 (*)    GFR calc Af Amer 46 (*)    Anion gap 18 (*)    All other components within normal limits  LACTIC ACID, PLASMA - Abnormal; Notable for the following components:   Lactic Acid, Venous 9.8 (*)    All other components within normal limits  LACTIC ACID, PLASMA - Abnormal; Notable for the following components:   Lactic Acid, Venous 4.4 (*)    All other components within normal limits  CBC WITH DIFFERENTIAL/PLATELET - Abnormal; Notable for the following components:   WBC 17.9 (*)    Neutro Abs 8.0 (*)    Lymphs Abs 7.7 (*)    Monocytes Absolute 1.5 (*)    Eosinophils Absolute 0.6 (*)    Abs Immature Granulocytes 0.12 (*)    All other components within normal limits  URINALYSIS, COMPLETE (UACMP) WITH MICROSCOPIC - Abnormal; Notable for the following components:   Color, Urine YELLOW (*)    APPearance CLEAR (*)    Protein, ur 100 (*)    Bacteria, UA RARE (*)    All other components within normal limits  BRAIN NATRIURETIC PEPTIDE - Abnormal; Notable for the following components:   B Natriuretic Peptide 869.9 (*)    All other components within normal limits  BLOOD GAS, ARTERIAL - Abnormal; Notable  for the following components:   pH, Arterial 7.23 (*)    pCO2 arterial 54 (*)    pO2, Arterial 170 (*)    Acid-base deficit 5.5 (*)    Allens test (pass/fail) POSITIVE (*)    All other components within  normal limits  TROPONIN I (HIGH SENSITIVITY) - Abnormal; Notable for the following components:   Troponin I (High Sensitivity) 30 (*)    All other components within normal limits  SARS CORONAVIRUS 2 BY RT PCR (HOSPITAL ORDER, [place] [facility])  CULTURE, BLOOD (ROUTINE X 2)  CULTURE, BLOOD (ROUTINE X 2)  PROTIME-INR  PATHOLOGIST SMEAR REVIEW   ____________________________________________  EKG  ED ECG REPORT I, Lavonia Drafts, the attending physician, personally viewed and interpreted this ECG.  Date: 03/17/2020  Rhythm: Atrial fibrillation QRS Axis: Left axis deviation Intervals: Interventricular conduction delay ST/T Wave abnormalities: ST depressions inferiorly Narrative Interpretation: ST depressions as noted above, conduction delay which appears to be new  ____________________________________________  RADIOLOGY  Post intubation chest x-ray reviewed, diffuse infiltrates edema versus multifocal pneumonia, discussed with respiratory therapy to pullback ET tube 3 cm ____________________________________________   PROCEDURES  Procedure(s) performed:yes  Procedure Name: Intubation Date/Time: 03/17/2020 4:44 PM Performed by: Lavonia Drafts, MD Pre-anesthesia Checklist: Patient identified, Patient being monitored, Emergency Drugs available, Timeout performed and Suction available Oxygen Delivery Method: Ambu bag Preoxygenation: Pre-oxygenation with 100% oxygen Induction Type: Rapid sequence Ventilation: Mask ventilation without difficulty Laryngoscope Size: Glidescope and 4 Grade View: Grade III Tube size: 7.5 mm Number of attempts: 1 Placement Confirmation: ETT inserted through vocal cords under direct vision,  CO2 detector and Breath sounds checked-  equal and bilateral Secured at: 23 cm Tube secured with: ETT holder    .1-3 Lead EKG Interpretation Performed by: Lavonia Drafts, MD Authorized by: Lavonia Drafts, MD     Interpretation: abnormal     ECG rate assessment: normal     Rhythm: atrial fibrillation     Ectopy: PVCs     Conduction: abnormal       Critical Care performed: yes  CRITICAL CARE Performed by: Lavonia Drafts   Total critical care time: 45 minutes  Critical care time was exclusive of separately billable procedures and treating other patients.  Critical care was necessary to treat or prevent imminent or life-threatening deterioration.  Critical care was time spent personally by me on the following activities: development of treatment plan with patient and/or surrogate as well as nursing, discussions with consultants, evaluation of patient's response to treatment, examination of patient, obtaining history from patient or surrogate, ordering and performing treatments and interventions, ordering and review of laboratory studies, ordering and review of radiographic studies, pulse oximetry and re-evaluation of patient's condition.  ____________________________________________   INITIAL IMPRESSION / ASSESSMENT AND PLAN / ED COURSE  Pertinent labs & imaging results that were available during my care of the patient were reviewed by me and considered in my medical decision making (see chart for details).  Patient presents with respiratory failure, unresponsive.  Differential includes COPD exacerbation, CHF with pulmonary edema, pneumonia, Covid  Patient unresponsive with hypoxia upon arrival, intubated successfully upon first attempt with RSI.  Placed on propofol drip, pending chest x-ray, will start duo nebs  Pending labs, ABG  Lab work notable for significantly elevated white blood cell count, elevated lactic acid, 30 mils per kilogram lactated Ringer's ordered, chest x-ray concerning for likely multi lobar  pneumonia, less likely edema  Discussed with ICU attending here, apparently no nurses.  Contacted [place], they report 24-hour weights for ICU patients, will try [facility]  Patient's blood pressure has declined, likely related to increasing propofol requirements for sedation also likely related to sepsis.  30 mL/kg LR infusing, will monitor blood pressure carefully.  We will switch to fentanyl  Versed and DC propofol  ----------------------------------------- 7:23 PM on 03/17/2020 -----------------------------------------  Blood pressure has increased to 106/65 with a MAP of 76.   Sepsis - Repeat Assessment  Performed at:    [address]  Vitals     Blood pressure 97/68, pulse 63, temperature 98.2 F (36.8 C), resp. rate 15, height 1.575 m (5\' 2" ), weight 85.8 kg, SpO2 99 %.  Heart:     Regular rate and rhythm  Lungs:    [place]ales  Capillary Refill:   <2 sec  Peripheral Pulse:   Radial pulse palpable  Skin:     Normal Color    Patient accepted to [facility] ICU by Dr. Wynonia Musty    ____________________________________________   FINAL CLINICAL IMPRESSION(S) / ED DIAGNOSES  Final diagnoses:  Community acquired pneumonia, unspecified laterality  Acute respiratory failure with hypoxia ([place])        Note:  This document was prepared using Dragon voice recognition software and may include unintentional dictation errors.   Lavonia Drafts, MD 03/17/20 [phone]

## 2020-03-17 NOTE — ED Notes (Signed)
Pt switched to sedation with [facility], our drips discontinued.

## 2020-03-17 NOTE — ED Notes (Signed)
Pt's son has her belongings.

## 2020-03-17 NOTE — Progress Notes (Signed)
Patient initially on trilogy.  Patient not ventilating due to fluid overload.  Patient bagged for several minutes while setting up servo vent to see if ventilation efforts would be better.  Patient not tolerating volume ventilation so placed patient in pressure control after xray to verify tube position and lung status. Dr Corky Downs at bedside to note ventilation status and aware of changes. Patient tolerated efforts well. See flowsheet for documentation.

## 2020-03-17 NOTE — ED Triage Notes (Signed)
Pt arrives [facility] emergency traffic for SOB. Pt was doing breathing treatment upon EMS arrival. Placed on cpap with EMS and pt went unresponsive so EMS started bagging pt with ambu-bag. Pt continues to be bagged upon arrival to ER.

## 2020-03-17 NOTE — ED Notes (Signed)
Pt temporarily bagged while vent is changed over. md in room. Breath sounds in all fields coarse.

## 2020-03-18 LAB — PATHOLOGIST SMEAR REVIEW

## 2020-03-22 LAB — CULTURE, BLOOD (ROUTINE X 2)
Culture: NO GROWTH
Culture: NO GROWTH
Special Requests: ADEQUATE
Special Requests: ADEQUATE

## 2020-04-17 DEATH — deceased

## 2020-06-23 IMAGING — CR DG CHEST 2V
2 series · 2 of 2 positions shown · non-contrast
Comparison: 08/07/2019

CLINICAL DATA: Near syncope. Slight chest pain. History of asthma,
breast cancer, atrial fibrillation, bronchitis, COPD and
hypertension.

EXAM:
CHEST - 2 VIEW

[chest lat]
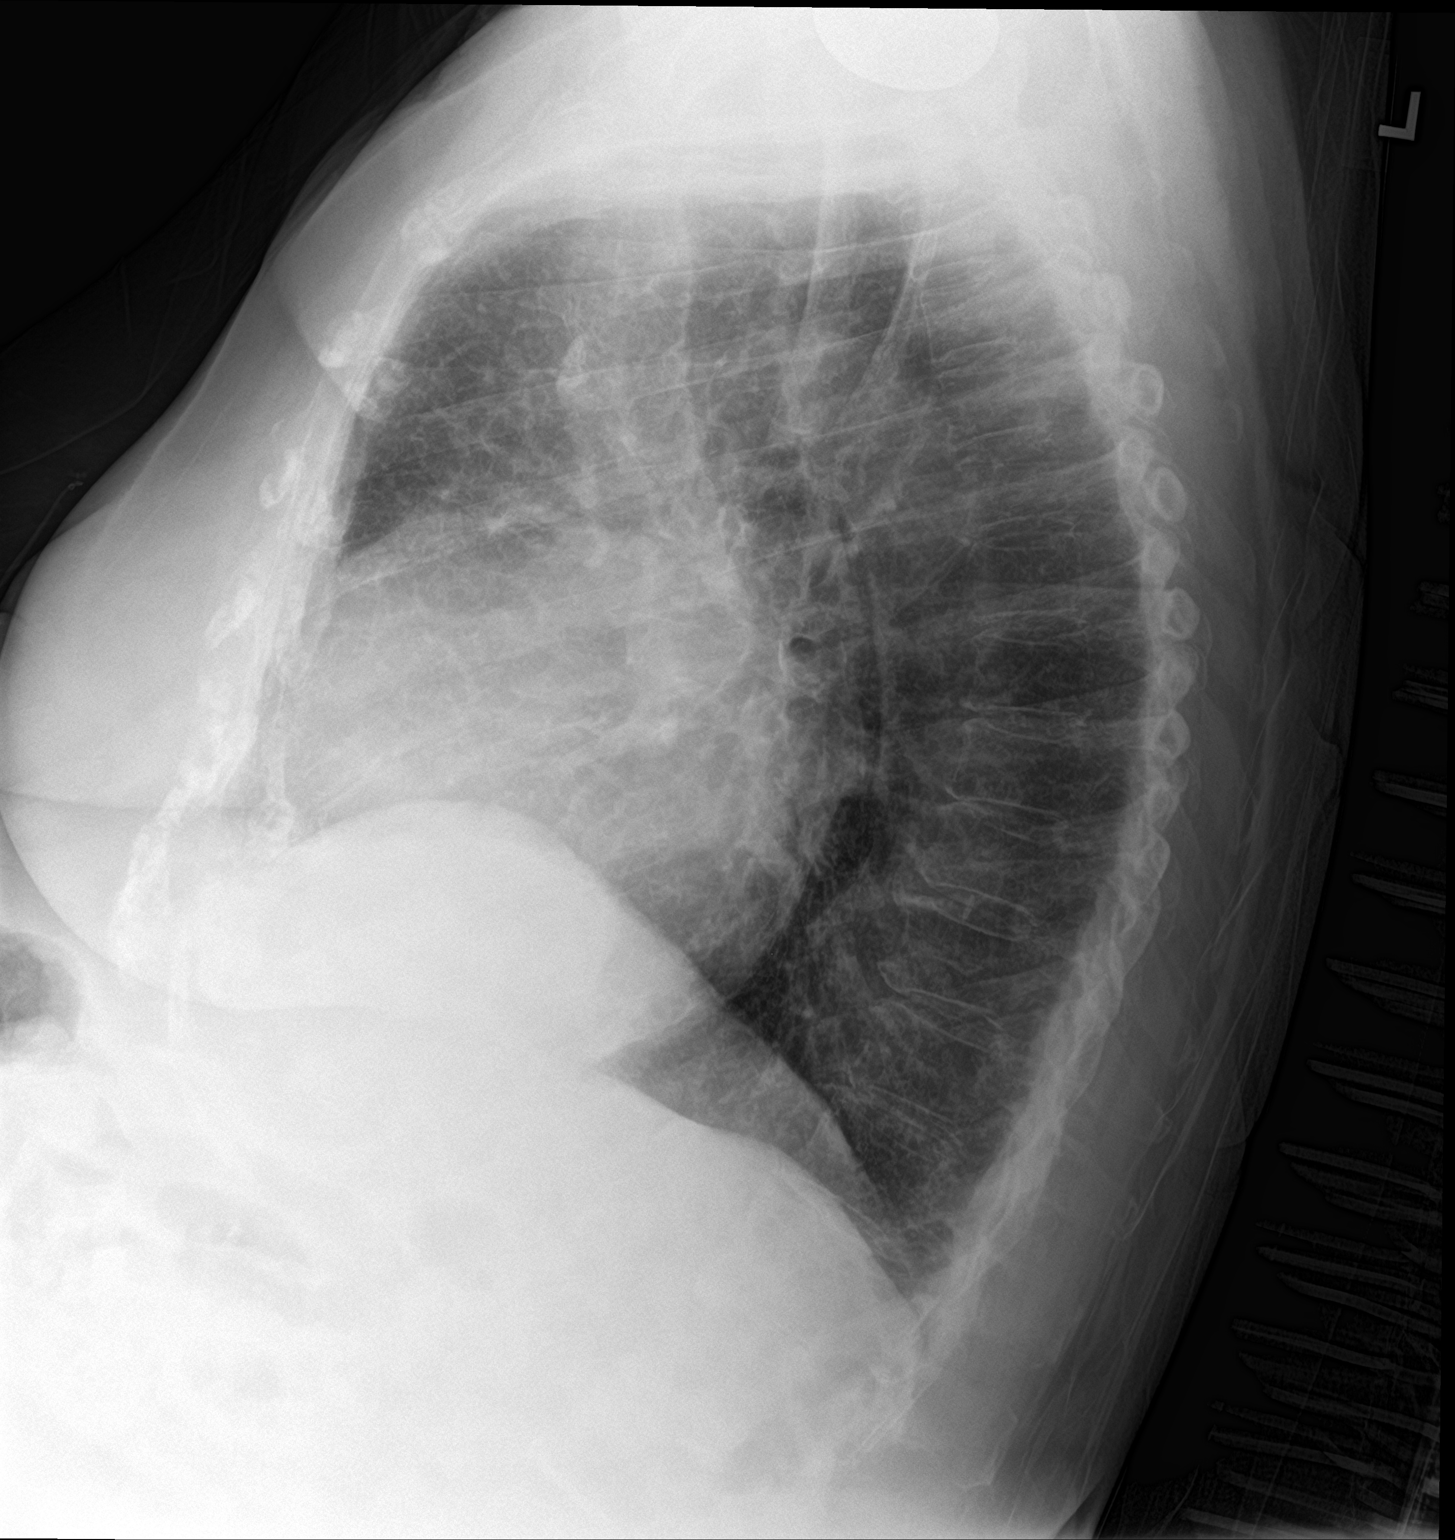

[chest ap]
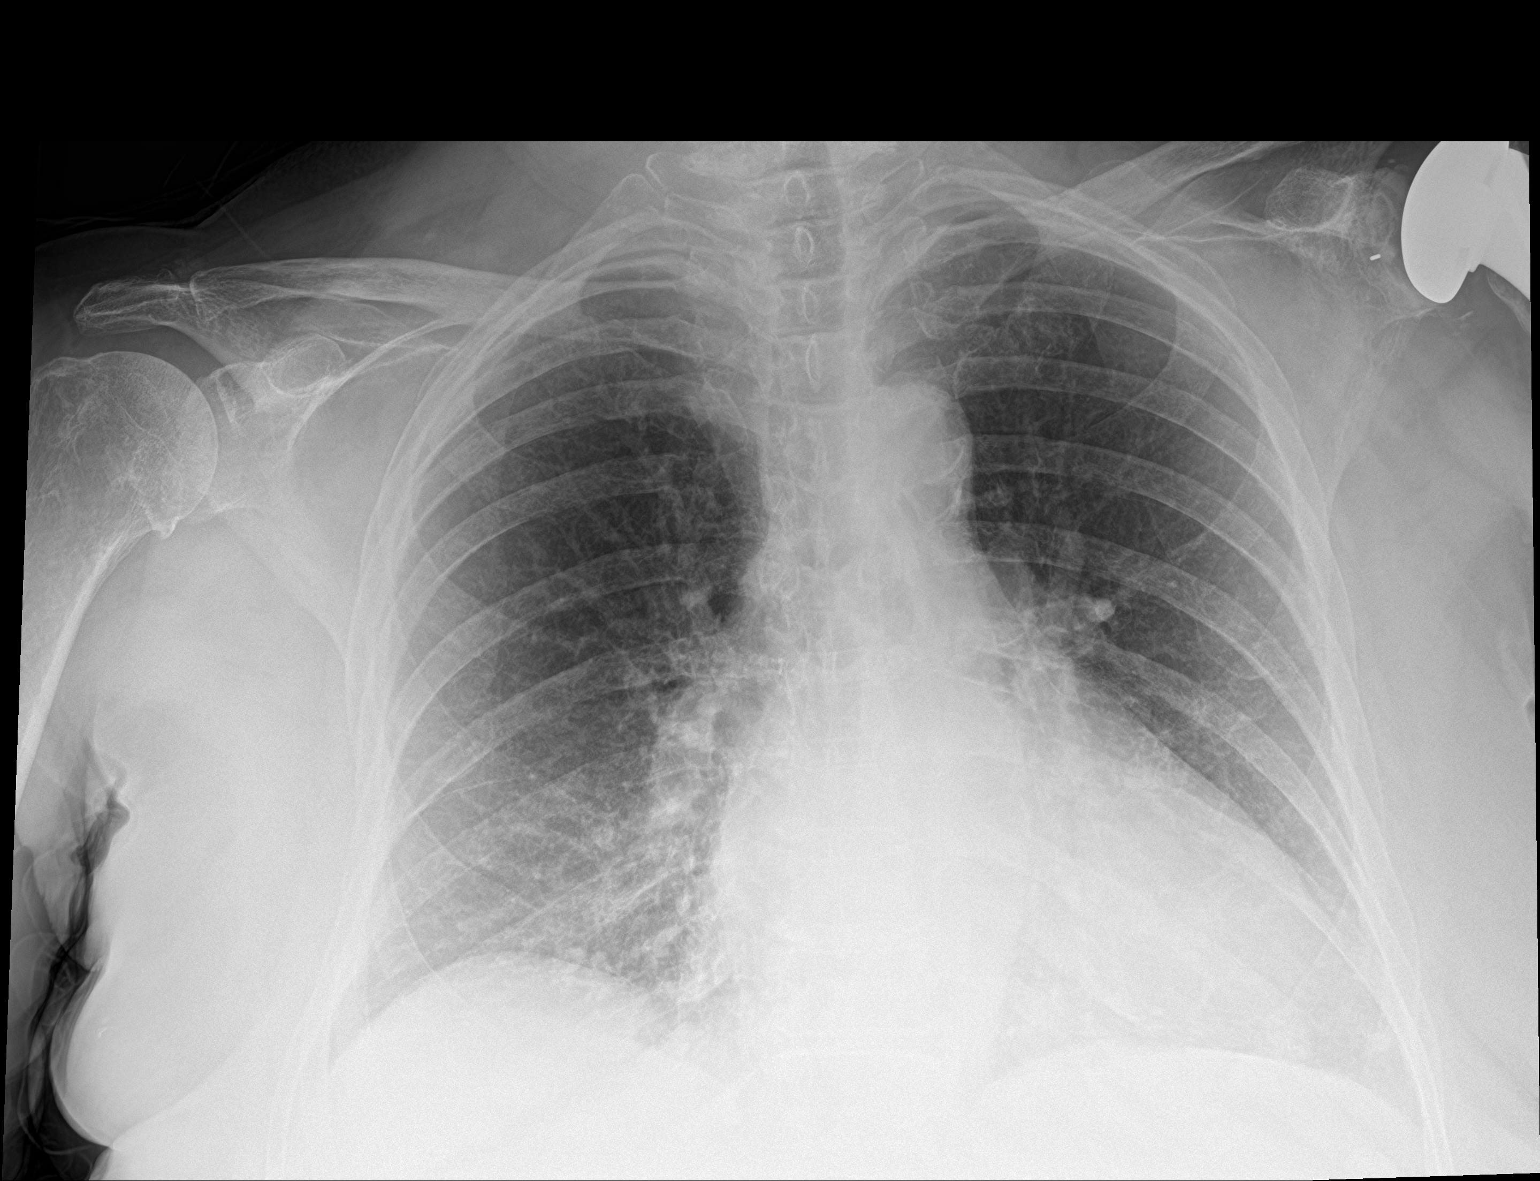

[2 of 2 positions shown; findings below may reference images not displayed]

FINDINGS: Enlarged cardiac silhouette with a mild increase in size. Decreased
prominence of the pulmonary vasculature and interstitial markings.
No pleural fluid. Diffuse osteopenia. Left shoulder prosthesis and
mild to moderate right glenohumeral joint degenerative spur
formation. Mild thoracic spine degenerative changes.
IMPRESSION: 1. No acute abnormality.
2. Mild cardiomegaly with mild progression.
3. Resolved changes of congestive heart failure.

## 2020-08-22 IMAGING — CR DG ABDOMEN 1V
1 series · 2 of 2 positions shown · non-contrast
Comparison: None.

CLINICAL DATA: Constipation

EXAM:
ABDOMEN - 1 VIEW

[Series 1: dg abd 1 view · 0.14mm/px · 2 of 2 slices shown]
[im 1/2]
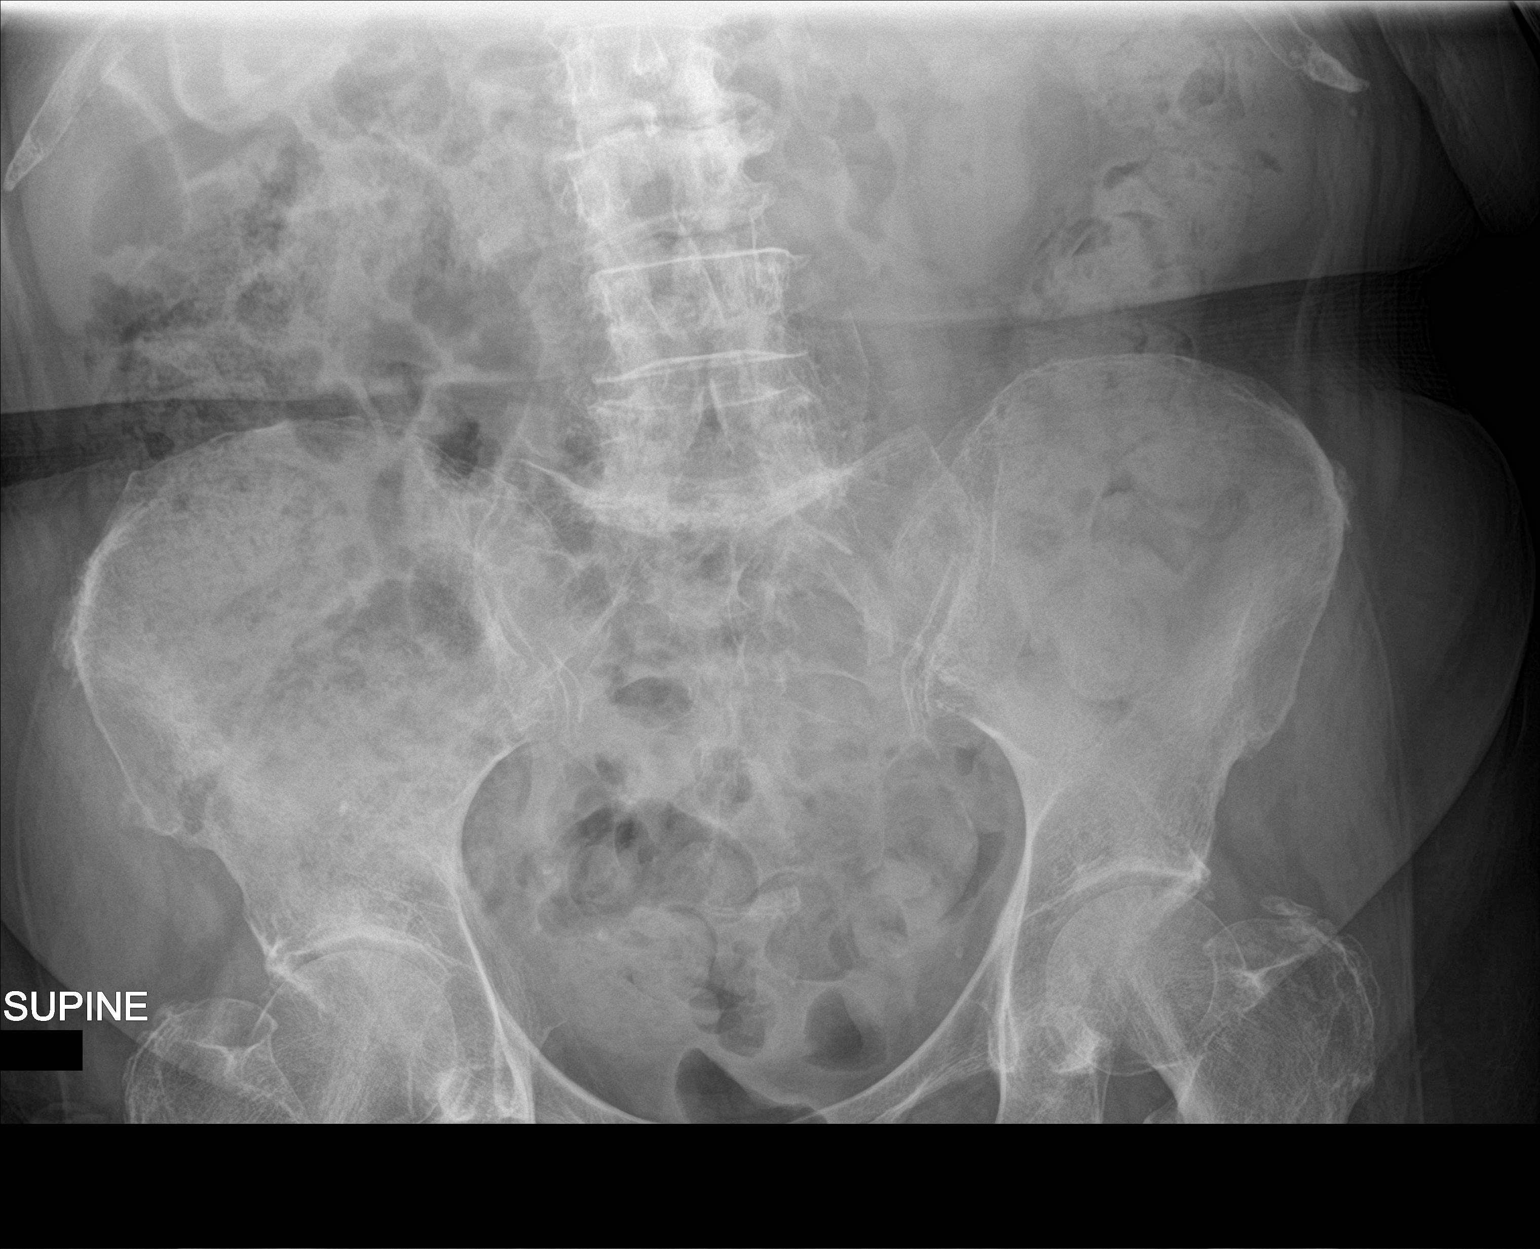
[im 2/2]
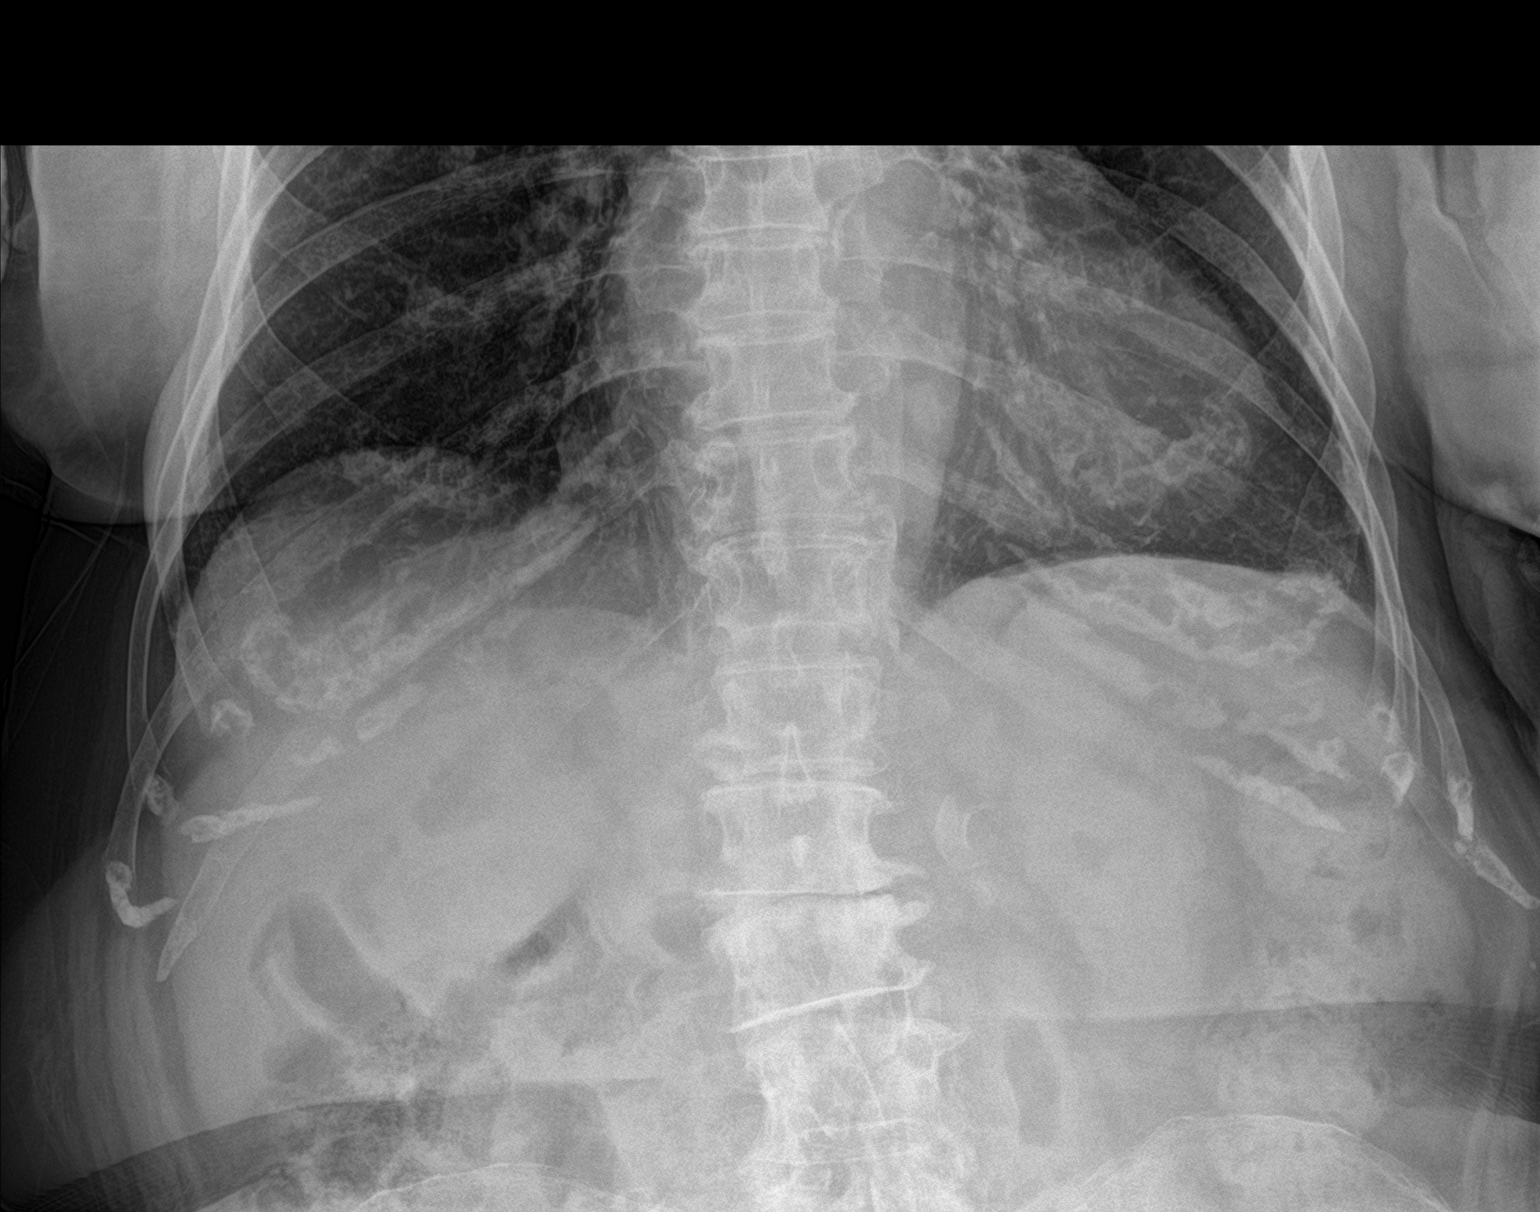

[2 of 2 positions shown; findings below may reference images not displayed]

FINDINGS: Nonobstructive pattern of bowel gas. Large burden of stool in the
left and right colon. No free air in the abdomen.
IMPRESSION: Nonobstructive pattern of bowel gas. Large burden of stool in the
left and right colon.
